# Patient Record
Sex: Male | Born: 1950 | Race: Black or African American | Hispanic: No | Marital: Married | Smoking: Never smoker
Health system: Southern US, Community
[De-identification: ages and names within clinical notes are randomized; demographics above are authoritative.]

## PROBLEM LIST (undated history)

## (undated) DIAGNOSIS — E785 Hyperlipidemia, unspecified: Secondary | ICD-10-CM

## (undated) DIAGNOSIS — G709 Myoneural disorder, unspecified: Secondary | ICD-10-CM

## (undated) DIAGNOSIS — R112 Nausea with vomiting, unspecified: Secondary | ICD-10-CM

## (undated) DIAGNOSIS — R569 Unspecified convulsions: Secondary | ICD-10-CM

## (undated) DIAGNOSIS — M1990 Unspecified osteoarthritis, unspecified site: Secondary | ICD-10-CM

## (undated) DIAGNOSIS — J189 Pneumonia, unspecified organism: Secondary | ICD-10-CM

## (undated) DIAGNOSIS — D649 Anemia, unspecified: Secondary | ICD-10-CM

## (undated) DIAGNOSIS — I8290 Acute embolism and thrombosis of unspecified vein: Secondary | ICD-10-CM

## (undated) DIAGNOSIS — Z8639 Personal history of other endocrine, nutritional and metabolic disease: Secondary | ICD-10-CM

## (undated) DIAGNOSIS — Z9889 Other specified postprocedural states: Secondary | ICD-10-CM

## (undated) DIAGNOSIS — R51 Headache: Secondary | ICD-10-CM

## (undated) DIAGNOSIS — E119 Type 2 diabetes mellitus without complications: Secondary | ICD-10-CM

## (undated) DIAGNOSIS — I10 Essential (primary) hypertension: Secondary | ICD-10-CM

## (undated) DIAGNOSIS — N19 Unspecified kidney failure: Secondary | ICD-10-CM

## (undated) DIAGNOSIS — I509 Heart failure, unspecified: Secondary | ICD-10-CM

## (undated) DIAGNOSIS — K635 Polyp of colon: Secondary | ICD-10-CM

## (undated) DIAGNOSIS — N186 End stage renal disease: Secondary | ICD-10-CM

## (undated) HISTORY — DX: Anemia, unspecified: D64.9

## (undated) HISTORY — DX: Headache: R51

## (undated) HISTORY — DX: Personal history of other endocrine, nutritional and metabolic disease: Z86.39

## (undated) HISTORY — DX: Essential (primary) hypertension: I10

## (undated) HISTORY — DX: Acute embolism and thrombosis of unspecified vein: I82.90

## (undated) HISTORY — DX: Unspecified kidney failure: N19

## (undated) HISTORY — DX: Hyperlipidemia, unspecified: E78.5

## (undated) HISTORY — DX: Polyp of colon: K63.5

---

## 1998-01-16 ENCOUNTER — Other Ambulatory Visit: Admission: RE | Admit: 1998-01-16 | Discharge: 1998-01-16 | Payer: Self-pay | Admitting: Nephrology

## 1998-01-22 ENCOUNTER — Ambulatory Visit (HOSPITAL_COMMUNITY): Admission: RE | Admit: 1998-01-22 | Discharge: 1998-01-22 | Payer: Self-pay | Admitting: Nephrology

## 1998-02-05 ENCOUNTER — Other Ambulatory Visit: Admission: RE | Admit: 1998-02-05 | Discharge: 1998-02-05 | Payer: Self-pay | Admitting: Nephrology

## 2003-06-20 ENCOUNTER — Encounter: Admission: RE | Admit: 2003-06-20 | Discharge: 2003-09-18 | Payer: Self-pay | Admitting: General Practice

## 2010-08-11 DIAGNOSIS — K635 Polyp of colon: Secondary | ICD-10-CM

## 2010-08-11 HISTORY — DX: Polyp of colon: K63.5

## 2010-09-08 ENCOUNTER — Inpatient Hospital Stay (HOSPITAL_COMMUNITY)
Admission: EM | Admit: 2010-09-08 | Discharge: 2010-09-13 | DRG: 315 | Disposition: A | Discharge status: Home / Self Care | Payer: BC Managed Care – PPO | Attending: Internal Medicine | Admitting: Internal Medicine

## 2010-09-08 DIAGNOSIS — E785 Hyperlipidemia, unspecified: Secondary | ICD-10-CM | POA: Diagnosis present

## 2010-09-08 DIAGNOSIS — E119 Type 2 diabetes mellitus without complications: Secondary | ICD-10-CM | POA: Diagnosis present

## 2010-09-08 DIAGNOSIS — N2581 Secondary hyperparathyroidism of renal origin: Secondary | ICD-10-CM | POA: Diagnosis present

## 2010-09-08 DIAGNOSIS — D631 Anemia in chronic kidney disease: Secondary | ICD-10-CM | POA: Diagnosis present

## 2010-09-08 DIAGNOSIS — Z7982 Long term (current) use of aspirin: Secondary | ICD-10-CM

## 2010-09-08 DIAGNOSIS — E8779 Other fluid overload: Secondary | ICD-10-CM | POA: Diagnosis present

## 2010-09-08 DIAGNOSIS — G589 Mononeuropathy, unspecified: Secondary | ICD-10-CM | POA: Diagnosis present

## 2010-09-08 DIAGNOSIS — N186 End stage renal disease: Principal | ICD-10-CM | POA: Diagnosis present

## 2010-09-08 DIAGNOSIS — I120 Hypertensive chronic kidney disease with stage 5 chronic kidney disease or end stage renal disease: Secondary | ICD-10-CM | POA: Diagnosis present

## 2010-09-08 DIAGNOSIS — N179 Acute kidney failure, unspecified: Secondary | ICD-10-CM | POA: Diagnosis present

## 2010-09-08 LAB — TROPONIN I
Troponin I: 0.05 ng/mL (ref 0.00–0.06)
Troponin I: 0.03 ng/mL (ref 0.00–0.06)

## 2010-09-08 LAB — TSH: TSH: 1.775 u[IU]/mL (ref 0.350–4.500)

## 2010-09-08 LAB — HIV ANTIBODY (ROUTINE TESTING W REFLEX): HIV: NONREACTIVE

## 2010-09-08 LAB — CARDIAC PANEL(CRET KIN+CKTOT+MB+TROPI)
CK, MB: 5 ng/mL — ABNORMAL HIGH (ref 0.3–4.0)
CK, MB: 5 ng/mL — ABNORMAL HIGH (ref 0.3–4.0)
Relative Index: 0.7 (ref 0.0–2.5)
Total CK: 673 U/L — ABNORMAL HIGH (ref 7–232)
Total CK: 789 U/L — ABNORMAL HIGH (ref 7–232)
Troponin I: 0.03 ng/mL (ref 0.00–0.06)

## 2010-09-08 LAB — FERRITIN: Ferritin: 557 ng/mL — ABNORMAL HIGH (ref 22–322)

## 2010-09-08 LAB — BASIC METABOLIC PANEL
CO2: 22 mEq/L (ref 19–32)
GFR calc Af Amer: 6 mL/min — ABNORMAL LOW (ref >60)
Glucose, Bld: 138 mg/dL — ABNORMAL HIGH (ref 70–99)
Potassium: 3.7 mEq/L (ref 3.5–5.1)
Sodium: 139 mEq/L (ref 135–145)

## 2010-09-08 LAB — IRON AND TIBC
Saturation Ratios: 17 % — ABNORMAL LOW (ref 20–55)
Saturation Ratios: 7 % — ABNORMAL LOW (ref 20–55)
TIBC: 261 ug/dL (ref 215–435)
TIBC: 220 ug/dL (ref 215–435)
UIBC: 216 ug/dL
UIBC: 204 ug/dL

## 2010-09-08 LAB — LIPID PANEL
Cholesterol: 214 mg/dL — ABNORMAL HIGH (ref 0–200)
LDL Cholesterol: 137 mg/dL — ABNORMAL HIGH (ref 0–99)
Triglycerides: 56 mg/dL (ref <150)
VLDL: 11 mg/dL (ref 0–40)

## 2010-09-08 LAB — VITAMIN B12: Vitamin B-12: 1372 pg/mL — ABNORMAL HIGH (ref 211–911)

## 2010-09-08 LAB — CK TOTAL AND CKMB (NOT AT ARMC)
CK, MB: 4.5 ng/mL — ABNORMAL HIGH (ref 0.3–4.0)
Relative Index: 1.4 (ref 0.0–2.5)
Relative Index: 0.9 (ref 0.0–2.5)

## 2010-09-08 LAB — DIFFERENTIAL
Basophils Absolute: 0 10*3/uL (ref 0.0–0.1)
Basophils Relative: 0 % (ref 0–1)
Lymphocytes Relative: 13 % (ref 12–46)
Neutro Abs: 11.3 10*3/uL — ABNORMAL HIGH (ref 1.7–7.7)
Neutrophils Relative %: 81 % — ABNORMAL HIGH (ref 43–77)

## 2010-09-08 LAB — C3 COMPLEMENT: C3 Complement: 85 mg/dL — ABNORMAL LOW (ref 88–201)

## 2010-09-08 LAB — CBC
HCT: 31.1 % — ABNORMAL LOW (ref 39.0–52.0)
Hemoglobin: 9.8 g/dL — ABNORMAL LOW (ref 13.0–17.0)
RBC: 3.94 MIL/uL — ABNORMAL LOW (ref 4.22–5.81)
WBC: 13.9 10*3/uL — ABNORMAL HIGH (ref 4.0–10.5)

## 2010-09-08 LAB — URINALYSIS, ROUTINE W REFLEX MICROSCOPIC
Ketones, ur: NEGATIVE mg/dL
Leukocytes, UA: NEGATIVE
Protein, ur: 100 mg/dL — AB
Urine Glucose, Fasting: NEGATIVE mg/dL
Urobilinogen, UA: 0.2 mg/dL (ref 0.0–1.0)

## 2010-09-08 LAB — GLUCOSE, CAPILLARY: Glucose-Capillary: 99 mg/dL (ref 70–99)

## 2010-09-08 LAB — NA AND K (SODIUM & POTASSIUM), RAND UR: Potassium Urine: 29 mEq/L

## 2010-09-08 LAB — C4 COMPLEMENT: Complement C4, Body Fluid: 26 mg/dL (ref 16–47)

## 2010-09-08 LAB — FOLATE: Folate: 8.3 ng/mL

## 2010-09-08 LAB — PROTEIN / CREATININE RATIO, URINE: Creatinine, Urine: 66.1 mg/dL

## 2010-09-08 LAB — URINE MICROSCOPIC-ADD ON

## 2010-09-08 LAB — MAGNESIUM: Magnesium: 2.3 mg/dL (ref 1.5–2.5)

## 2010-09-09 LAB — COMPREHENSIVE METABOLIC PANEL
ALT: 43 U/L (ref 0–53)
Albumin: 3.2 g/dL — ABNORMAL LOW (ref 3.5–5.2)
Alkaline Phosphatase: 87 U/L (ref 39–117)
BUN: 115 mg/dL — ABNORMAL HIGH (ref 6–23)
Chloride: 101 mEq/L (ref 96–112)
Glucose, Bld: 140 mg/dL — ABNORMAL HIGH (ref 70–99)
Potassium: 3.2 mEq/L — ABNORMAL LOW (ref 3.5–5.1)
Sodium: 139 mEq/L (ref 135–145)
Total Bilirubin: 0.6 mg/dL (ref 0.3–1.2)

## 2010-09-09 LAB — HEPATITIS PANEL, ACUTE
Hep A IgM: NEGATIVE
Hep B C IgM: NEGATIVE

## 2010-09-09 LAB — CBC
HCT: 26.5 % — ABNORMAL LOW (ref 39.0–52.0)
MCV: 79.1 fL (ref 78.0–100.0)
RBC: 3.35 MIL/uL — ABNORMAL LOW (ref 4.22–5.81)
WBC: 9.8 10*3/uL (ref 4.0–10.5)

## 2010-09-09 LAB — GLUCOSE, CAPILLARY
Glucose-Capillary: 137 mg/dL — ABNORMAL HIGH (ref 70–99)
Glucose-Capillary: 132 mg/dL — ABNORMAL HIGH (ref 70–99)
Glucose-Capillary: 100 mg/dL — ABNORMAL HIGH (ref 70–99)

## 2010-09-09 LAB — ANA: Anti Nuclear Antibody(ANA): NEGATIVE

## 2010-09-09 LAB — PTH, INTACT AND CALCIUM
Calcium, Total (PTH): 7 mg/dL — ABNORMAL LOW (ref 8.4–10.5)
PTH: 1076.5 pg/mL — ABNORMAL HIGH (ref 14.0–72.0)

## 2010-09-09 LAB — BRAIN NATRIURETIC PEPTIDE: Pro B Natriuretic peptide (BNP): 482 pg/mL — ABNORMAL HIGH (ref 0.0–100.0)

## 2010-09-09 LAB — VITAMIN D 25 HYDROXY (VIT D DEFICIENCY, FRACTURES): Vit D, 25-Hydroxy: 12 ng/mL — ABNORMAL LOW (ref 30–89)

## 2010-09-09 NOTE — Consult Note (Signed)
[name], [name]             ACCOUNT NO.:  [id number]  MEDICAL RECORD NO.:  [id number]          PATIENT TYPE:  INP  LOCATION:  [id number]                         FACILITY:  [facility]  PHYSICIAN:  Sol Blazing, M.D.DATE OF BIRTH:  [birth year]  DATE OF CONSULTATION:  09/08/2010 DATE OF DISCHARGE:                                CONSULTATION   REQUESTING PHYSICIAN:  Sheila Oats, MD  REASON FOR CONSULT:  Renal failure and uncontrolled hypertension.  HISTORY:  The patient is a 60 year old [place] male with a history of diabetes and high blood pressure who presented with cough and shortness of breath of 24 hours duration.  The patient presented to the emergency room with these complaints and was found to have pulmonary edema, severe hypertension with blood pressure 208/129 and a creatinine of 10.8.  He denies any history of past kidney disease.  The patient was given Lasix and oxygen, has had good urine output and he states his shortness of breath is improved.  He was admitted to the tep-down unit.  Blood pressure on IV nitroglycerin is now 179/90.  The patient says he is not taking any medications currently.  He took some NSAIDs in the last year between May and September, Naprosyn, about 2-3 times day.  He was diagnosed with both high blood pressure and diabetes in 2004 or 2005.  It ounds like he took diabetic medication for some time and then he has been "diet-controlled" and not on medication for years.  The patient is vague in the details.  He says he has been prescribed blood pressure medication in the past, but had side effects and said it made him "feel bad,"  so he stopped taking it.  The last time he saw a doctor was what sounds like an urgent-care physician that he has been seeing regularly over the years about once or twice a year.  The last time  he saw him was in July 2011.  He has not taking any blood pressure medication since around that time.  He says he goes to  the doctor "when he feels bad".  He has not had a primary care physician in the commonly used sense of the term.  He said the last medicine he was prescribed was a combination blood pressure and cholesterol pill.  We have no old lab values.  This is the first time that he has been to this facility and admitted here.  PAST MEDICAL HISTORY: 1. High blood pressure 7 years' duration. 2. Diabetes 7 years' duration. 3. Questionable history of gout.  He denies any history of kidney disease.  PAST SURGICAL HISTORY:  None.  MEDICATIONS:  None.  ALLERGIES:  None.  SOCIAL HISTORY:  Drinks 1-2 cans of beer a week.  No history of any heavy alcohol, drug, or tobacco use.  He is married.  His wife is here with him today.  He is currently between jobs he says.  FAMILY HISTORY:  Father had diabetes and hypertension.  REVIEW OF SYSTEMS:  Denies any recent fever, chills, sweats, headache, visual change, sore throat, or difficulty swallowing.  He did present with shortness of breath,  orthopnea, and PND over 24-48 hours.  He had a cough which was nonproductive.  Denies any active chest pain.  He has had several episodes of vomiting and nausea over the last 6 months, but they have not been on a daily basis, maybe less than once a month according to his history.  He does endorse anorexia and loss of taste for food.  This has been going on for some time.  He denies any loss of energy.  He has been having cramps in the feet and calves, and this is one of his primary complaints.  His sleeping has been okay.  Denies any diarrhea.  No confusion or memory loss.  MUSCULOSKELETAL:  He thinks he has a history of gout but he has never been had a joint tapped and he does not take medication for the same.  GU:  Denies any difficulty voiding, dysuria, history of prostate disease, or kidney failure.  GI: Denies any diarrhea, otherwise as above.  No abdominal pain.  No history of any peptic ulcer disease,  pancreatitis, or liver disease. NEUROLOGIC:  Denies any history of stroke, TIA, seizure, focal numbness, or weakness.  CARDIAC:  Denies any history of heart attack, angina, or heart failure.  PSYCHIATRIC:  Denies any history of anxiety, depression, schizophrenia, or hallucination.  PHYSICAL EXAMINATION:  VITAL SIGNS:  Blood pressure 170/97 on nitroglycerin drip, heart rate 80, respirations 18, temperature 98.6. SKIN:  Warm and dry without rash or cyanosis. HEENT:  PERRLA, EOMI.  Throat is clear and moist. NECK:  Supple.  Neck veins are distended.  JVP of 12-14 cm. CHEST:  Crackles in the left base.  No wheezing and decreased breath sounds in the left base, otherwise clear. CARDIAC:  Regular rate and rhythm.  No murmur, rub, or gallop. Precordium is quiet.  No heaves or lifts. ABDOMEN:  Soft, nontender, active bowel sounds.  No ascites.  No masses, no organomegaly.  Liver is palpable 2 cm below the right costal margin. GU:  Normal male circumcised genitalia.  No catheter in place. EXTREMITIES:  Pitting edema 1+ in the lower extremities below the knees bilaterally, not severe, but definitely present.  Good pulses in the feet, 2+ bilaterally.  No calf cords or tenderness.  No asymmetric edema. NEUROLOGIC:  Nonfocal motor exam.  Alert and oriented x3.  Responds appropriately, and he is quite awake and alert.  No asterixis.  Gait was not tested.  LABORATORY DATA:  Sodium 139, potassium 3.7, CO2 of 22, BUN 119, creatinine 10.8, glucose 138.  White blood count 13,000, hemoglobin 9.8, platelets 201.  Chest x-ray, vascular congestion with interstitial edema.  Urinalysis, 100 protein, 3-6 red blood cells, 0-2 white blood cells.  Ultrasound, 9.4 cm on one side, 10.8 on the other with markedly increased echogenicity.  No hydronephrosis.  Phosphorus 6.2.  GFR 6 mL per minute.  IMPRESSION: 1. Renal failure, severe, unclear if there me be any acute reversible     component related to malignant  hypertension.     However, with the patient's history of inconsistent followup     and inconsistent medication compliance combined with the     presentation positive for anorexia, intermittent nausea, vomiting,     severe hypertension, anemia, hyperphosphatemia, and echogenic     kidneys which are measuring less than 10 cm, this is all     concerning for suspected chronic kidney failure.  If so this is     stage V disease and quite possibly irreversible.  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  Also could have some diabetic     nephropathy.  For now, we will follow for the next 48-72 hours     clinically.  Give Lasix a chance to work for the pulmonary edema     and volume overload, and see if the creatinine improves any over     the next few days with blood pressure control.  If he develops any     severe uremic symptoms,  he will require acute initiation     of dialysis.  Also, if the renal function doesn't improve, he will     likely require     initiation of dialysis during this hospitalization. All of this was      explained to the patient and the patient's wife who are a little bit      shocked at the gravity of the situation. 2. Diabetes, diet controlled according to the patient.  We will check     hemoglobin A1c.  He has 7-year history.3. Severe hypertension, on IV nitroglycerin.  Also 7-year history. 4. Volume excess with pulmonary edema, mild to moderate severity. 5. Hyperphosphatemia.  Suspect secondary hyperparathyroidism.    RECOMMENDATIONS: Give IV Lasix 2 more doses, check iron stores, PTH, and     vitamin D level and urine protein to creatinine ratio.  Remove IV     from the left arm and save the left arm and do vein mapping.  Start     Norvasc and labetalol p.o. for blood pressure control, and wean     off the IV nitroglycerin.  Follow daily creatinine for next     couple of days.  Make a decision on dialysis pending subsequent blood chemistries     symptoms,  etc.     Sol Blazing, M.D.     RDS/MEDQ  D:  09/08/2010  T:  09/09/2010  Job:  [id number]  Electronically Signed by Roney Jaffe M.D. on 09/09/2010 08:40:29 AM

## 2010-09-10 LAB — RENAL FUNCTION PANEL
Albumin: 3 g/dL — ABNORMAL LOW (ref 3.5–5.2)
BUN: 106 mg/dL — ABNORMAL HIGH (ref 6–23)
Calcium: 7.1 mg/dL — ABNORMAL LOW (ref 8.4–10.5)
Creatinine, Ser: 10.94 mg/dL — ABNORMAL HIGH (ref 0.4–1.5)
Glucose, Bld: 132 mg/dL — ABNORMAL HIGH (ref 70–99)
Phosphorus: 6 mg/dL — ABNORMAL HIGH (ref 2.3–4.6)
Potassium: 3.4 mEq/L — ABNORMAL LOW (ref 3.5–5.1)

## 2010-09-10 LAB — PROTEIN ELECTROPHORESIS, SERUM
Albumin ELP: 61.9 % (ref 55.8–66.1)
Alpha-1-Globulin: 5.3 % — ABNORMAL HIGH (ref 2.9–4.9)
Beta 2: 5.1 % (ref 3.2–6.5)

## 2010-09-10 LAB — CBC
MCV: 78.9 fL (ref 78.0–100.0)
Platelets: 154 10*3/uL (ref 150–400)
RBC: 3.22 MIL/uL — ABNORMAL LOW (ref 4.22–5.81)
RDW: 14 % (ref 11.5–15.5)
WBC: 8.7 10*3/uL (ref 4.0–10.5)

## 2010-09-10 LAB — GLUCOSE, CAPILLARY
Glucose-Capillary: 102 mg/dL — ABNORMAL HIGH (ref 70–99)
Glucose-Capillary: 130 mg/dL — ABNORMAL HIGH (ref 70–99)

## 2010-09-10 LAB — MAGNESIUM: Magnesium: 1.9 mg/dL (ref 1.5–2.5)

## 2010-09-11 DIAGNOSIS — N186 End stage renal disease: Secondary | ICD-10-CM

## 2010-09-11 DIAGNOSIS — I120 Hypertensive chronic kidney disease with stage 5 chronic kidney disease or end stage renal disease: Secondary | ICD-10-CM

## 2010-09-11 DIAGNOSIS — I739 Peripheral vascular disease, unspecified: Secondary | ICD-10-CM

## 2010-09-11 LAB — GLUCOSE, CAPILLARY
Glucose-Capillary: 123 mg/dL — ABNORMAL HIGH (ref 70–99)
Glucose-Capillary: 102 mg/dL — ABNORMAL HIGH (ref 70–99)

## 2010-09-12 DIAGNOSIS — N186 End stage renal disease: Secondary | ICD-10-CM

## 2010-09-12 DIAGNOSIS — I120 Hypertensive chronic kidney disease with stage 5 chronic kidney disease or end stage renal disease: Secondary | ICD-10-CM

## 2010-09-12 LAB — RENAL FUNCTION PANEL
CO2: 22 mEq/L (ref 19–32)
Calcium: 7.5 mg/dL — ABNORMAL LOW (ref 8.4–10.5)
Creatinine, Ser: 11.43 mg/dL — ABNORMAL HIGH (ref 0.4–1.5)
Glucose, Bld: 88 mg/dL (ref 70–99)

## 2010-09-12 LAB — CBC
HCT: 24.2 % — ABNORMAL LOW (ref 39.0–52.0)
Hemoglobin: 7.9 g/dL — ABNORMAL LOW (ref 13.0–17.0)
MCH: 25.6 pg — ABNORMAL LOW (ref 26.0–34.0)
MCHC: 32.6 g/dL (ref 30.0–36.0)

## 2010-09-12 LAB — GLUCOSE, CAPILLARY
Glucose-Capillary: 99 mg/dL (ref 70–99)
Glucose-Capillary: 99 mg/dL (ref 70–99)

## 2010-09-12 LAB — SURGICAL PCR SCREEN: Staphylococcus aureus: NEGATIVE

## 2010-09-13 LAB — GLUCOSE, CAPILLARY: Glucose-Capillary: 123 mg/dL — ABNORMAL HIGH (ref 70–99)

## 2010-09-14 NOTE — Discharge Summary (Signed)
NAMEMARSON, [name]             ACCOUNT NO.:  [id number]  MEDICAL RECORD NO.:  [id number]           PATIENT TYPE:  I  LOCATION:  [id number]                         FACILITY:  [facility]  PHYSICIAN:  Kieth Brightly, MDDATE OF BIRTH:  January 18, 1951  DATE OF ADMISSION:  09/08/2010 DATE OF DISCHARGE:                        DISCHARGE SUMMARY - REFERRING   PRIMARY CARE PHYSICIAN:  Currently, he does not have and he is expected to see some physician in [facility].  NEPHROLOGY:  Elmarie Shiley, MD.  VASCULAR SURGERY:  [name]. Scot Dock, MD.  DISCHARGE DIAGNOSES: 1. Acute pulmonary edema with shortness of breath with hypoxemia - due     to increase volume overload secondary to end-stage renal disease. 2. End-stage renal disease - new diagnosis secondary to uncontrolled     hypertension. 3. Malignant essential hypertension, currently controlled with     medications. 4. Anemia of kidney disease. 5. 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 6. Status post placement of the AV fistula in left upper extremity. 7. End-stage renal disease, not on hemodialysis yet - status post AV     fistula placement. 8. Previously mentioned as history of diabetes mellitus - blood sugars     during hospital stay were totally normal and hemoglobin A1c is well     within normal limits - really questionable about the previous     mention about history of diabetes - needs outpatient followup with     fasting blood sugar levels. 9. Dyslipidemia.  DISCHARGE MEDICATIONS: 1. Enteric-coated aspirin 81 mg p.o. daily. 2. Lipitor 10 mg p.o. daily q.h.s. 3. Calcitriol 0.5 mcg p.o. daily. 4. Calcium acetate 667 mg tablets two tablets p.o. t.i.d. with meals. 5. Coreg 12.5 mg p.o. b.i.d. with meals. 6. Gabapentin 100 mg p.o. t.i.d. 7. Hydralazine 25 mg p.o. t.i.d. 8. Nu-Iron 150 mg p.o. daily with breakfast. 9. Procardia XL 60 mg p.o. q.a.m. 10.Procrit 10,000 units subcutaneously weekly. 11.The  following medication has been discontinued; Aleve, naproxen,     and prednisone.  HOSPITAL COURSE: 1. End-stage renal disease with fluid overload - the patient presented     to the emergency room on 09/08/2010 with extreme shortness of     breath.  He stated that he could not breath and he felt like he was     under water and can get himself breath.  His creatinine was found     to be markedly elevated.  We do not have any prior lab values to     compare with.  To note, the patient stated that he has history of     hypertension not being taking any medications and he also mentioned     that he has been diagnosed with diabetes once before; not sure     about the previous treatment, but he was not on any medications.     His creatinine was found to be extremely high with high BUN levels     and renal consult was called.  Sonogram of the renal kidneys     revealed chronic kidney disease and the patient had elevated PTH     levels and high phosphorus levels  going in with a diagnosis of     chronic kidney disease rather than acute renal failure.  He also     had normocytic anemia due to chronic kidney disease.  The patient,     however, is making pretty good urine and he will be following up     with Dr. Elmarie Shiley.  Nephrology has signed off and they said there     is no need for dialysis at this time.  However, he has had an AV     fistula placed in his left upper extremity in anticipation for     future hemodialysis.  Currently, he will be followed closely with     electrolyte levels.  His BUN is 97, but his mental status is pretty     okay possibly due to chronic adaptation to the urea level.  He will     follow up with Nephrology as an outpatient for further workup if     needed as well as with further routine followups.  Currently, his fluid status has completely reversed.  He is not short of breath anymore and he is ready for discharge as per Nephrology. 1. Hypertension, pulmonary  edema.  Pulmonary edema is due to fluid     overload and high blood pressures.  Blood pressure has been well     controlled.  At this time, the pulmonary edema is easy, does not     have any crackles in his chest.  He possibly has stiff vessels; so     I will add as much vasodilators at this time, so I will replace     Norvasc with Procardia XL and will continue on Coreg instead of     labetalol as well as we will continue with hydralazine.  He will     however take 81 mg baby aspirin daily. 2. For dyslipidemia, we will put him on Lipitor and follow up with his     PMD for further blood checks and also can follow up with his     Nephrologist for a check of liver function tests in another two to     three weeks.  DISPOSITION:  Discharged back home.  FOLLOWUP: 1. Follow up with Dr. Elmarie Shiley as outpatient in one week's time.  The     appointment for Dr. Elmarie Shiley is on 09/25/2010. 2. Follow up with the primary care physician at [facility],     which has been set up at this time. 3. Follow up with Dr. Deitra Mayo in four to six weeks.     Office will arrange for followup.  SUBJECTIVE:  VITAL SIGNS:  Temperature 98.9 heart rate 79, respiration 20, blood pressure 148/82, O2 sat 97% next GENERAL:  The patient seen and examined today, not in obvious distress, afebrile. HEAD AND NECK:  No JVD, no bruit, no nodes. CHEST:  Bilateral air entry good anteriorly and posteriorly.  No rales or wheezes. CARDIAC:  S1, S2 regular.  No murmurs. ABDOMEN:  Soft, nontender.  No organomegaly. EXTREMITIES:  Left upper extremity shows AV fistula, which is working with a good thrill.  ASSESSMENT AND PLAN:  As dictated above.  A total of 45 minutes spent on the discharge process.     Kieth Brightly, MD     UT/MEDQ  D:  09/13/2010  T:  09/13/2010  Job:  [id number]  cc:   Elmarie Shiley, MD [name]. Scot Dock, M.D.  Electronically Signed by Kieth Brightly  MD on 09/14/2010  11:27:18 AM

## 2010-09-16 NOTE — Consult Note (Addendum)
NAMEHENRICK, [name]             ACCOUNT NO.:  [id number]  MEDICAL RECORD NO.:  [id number]           PATIENT TYPE:  I  LOCATION:  [id number]                         FACILITY:  [facility]  PHYSICIAN:  Wray Kearns, PA-C  DATE OF BIRTH:  1951-02-25  DATE OF CONSULTATION:  09/11/2010 DATE OF DISCHARGE:                                CONSULTATION   CHIEF COMPLAINT:  End-stage renal disease.  The patient not on hemodialysis, but need for hemodialysis access.  HISTORY OF PRESENT ILLNESS:  Mr. Janke is a 60 year old male with a history of diabetes, hypertension, hypercholesterolemia, and gout who was admitted on September 08, 2010, with shortness of breath and nausea and vomiting.  He was found at that time to have creatinine greater than 10 and a blood pressure of 205/129.  He was admitted to Medicine Service for blood pressure control.  He was seen by Renal Service and they asked [place] to place an arterial venous fistula versus graft as the patient does not need hemodialysis immediately, but will need it in the near future. Vein mapping was done as well.  Labs on September 10, 2010; sodium was 140, potassium was 3.4, BUN was 106, and creatinine was 10.94.  His hemoglobin/hematocrit was 8.3 and 25.4 with a white count of 8.7.  IMAGING:  Vein mapping shows cephalic vein left upper extremity to be greater than 3 mm from the wrist to the axilla.  The patient is right- hand dominant.  PAST MEDICAL HISTORY:  Significant for, 1. Diabetes. 2. Hypertension. 3. Hypercholesterolemia. 4. Gout. 5. End-stage renal disease.  ALLERGIES:  He denies any allergies.  MEDICATIONS:  He is on aspirin, sliding scale insulin, labetalol, PhosLo, and amlodipine.  FAMILY HISTORY:  He states his father had a history of diabetes, but does not know the rest of the family history as he is from [place].  REVIEW OF SYSTEMS:  Ten-point review of systems is as above positive for gout, shortness of breath end-stage  renal disease.  He denies coronary artery disease or claudication-type symptoms.  He does state he has occasionally sharp pains in the bottom of the feet, which prevents him from walking long distances.  He denies any diabetic neuropathy or numbness or tingling in either foot.  PHYSICAL EXAMINATION:  GENERAL:  This is a well-developed, well- nourished gentleman in no acute distress.  He is alert and oriented x3. VITAL SIGNS:  His saturations are 99%, blood pressure is 140/77, heart rate is 76 in sinus rhythm. LUNGS:  Clear anteriorly. HEART:  Rate and rhythm is regular. EXTREMITIES:  Left upper extremity has a 2+ radial pulse and 1+ ulnar pulse, both palpable.  His hand is warm and pink with good sensation and motion.  Bilateral lower extremities, he has palpable DP and PT pulses with normal sensation and motion.  Of note, his fourth toes are small than the rest of the toes on both feet.  ASSESSMENT/PLAN:  End-stage renal disease in this hypertensive diabetic patient who does not need hemodialysis, but will in the near future.  He has adequate cephalic vein in the left upper extremity and we should be able to do  an arteriovenous fistula.  The plan is to do left upper extremity AV fistula versus graft on September 12, 2010.  The patient shows understanding.  All questions were answered, reports were written for consent and lab work and antibiotics prior to surgery.     Wray Kearns, PA-C     RR/MEDQ  D:  09/11/2010  T:  09/12/2010  Job:  [id number]  Electronically Signed by Wray Kearns PA on 09/16/2010 10:18:34 AM

## 2010-09-17 ENCOUNTER — Encounter (HOSPITAL_COMMUNITY): Payer: BC Managed Care – PPO | Attending: Internal Medicine

## 2010-09-17 DIAGNOSIS — N039 Chronic nephritic syndrome with unspecified morphologic changes: Secondary | ICD-10-CM | POA: Insufficient documentation

## 2010-09-17 DIAGNOSIS — D631 Anemia in chronic kidney disease: Secondary | ICD-10-CM | POA: Insufficient documentation

## 2010-09-17 DIAGNOSIS — N186 End stage renal disease: Secondary | ICD-10-CM | POA: Insufficient documentation

## 2010-09-18 NOTE — Op Note (Signed)
  NAMEJASUN, OWCZARZAK NO.:  [id number]  MEDICAL RECORD NO.:  [id number]           PATIENT TYPE:  LOCATION:                                 FACILITY:  PHYSICIAN:  [name]. Scot Dock, M.D.DATE OF BIRTH:  24-Jun-1951  DATE OF PROCEDURE:  09/12/2010 DATE OF DISCHARGE:                              OPERATIVE REPORT   PREOPERATIVE DIAGNOSIS:  Chronic kidney disease.  POSTOPERATIVE DIAGNOSIS:  Chronic kidney disease.  PROCEDURE:  Left forearm AV fistula (brachiocephalic fistula).  SURGEON:  [name]. Scot Dock, MD  ASSISTANT:  Nurse.  ANESTHESIA:  Local with sedation.  TECHNIQUE:  The patient was taken to the operating room sedated by Anesthesia.  The left upper extremity was prepped and draped in the usual sterile fashion.  After the skin was infiltrated with 1% lidocaine, an oblique incision was made at the left wrist and here, the cephalic vein was dissected free.  It was ligated distally and then irrigated up with heparinized saline, was about 3.5-mm vein.  The radial artery was dissected free beneath the fascia.  The patient was heparinized.  The radial artery was clamped proximally and distally and a longitudinal arteriotomy was made.  The vein was spatulated, cut to the appropriate length, and sewn end-to-side to the radial artery using continuous 6-0 Prolene suture.  At the completion, there was a good thrill in the fistula.  Hemostasis was obtained in the wound.  The wound was closed with deep layer of 3-0 Vicryl, the skin closed with 4-0 Vicryl.  Sterile dressing was applied.  The patient tolerated the procedure well, was transferred to the recovery room in stable condition.  All needle and sponge counts were correct.     [name]. Scot Dock, M.D.     CSD/MEDQ  D:  09/12/2010  T:  09/13/2010  Job:  [id number]  Electronically Signed by Deitra Mayo M.D. on 09/18/2010 02:09:59 PM

## 2010-09-25 ENCOUNTER — Encounter (HOSPITAL_COMMUNITY): Payer: BC Managed Care – PPO

## 2010-09-30 ENCOUNTER — Ambulatory Visit: Payer: Self-pay | Admitting: Internal Medicine

## 2010-10-02 ENCOUNTER — Other Ambulatory Visit: Payer: Self-pay

## 2010-10-02 ENCOUNTER — Encounter (HOSPITAL_COMMUNITY): Payer: BC Managed Care – PPO

## 2010-10-07 ENCOUNTER — Encounter (HOSPITAL_COMMUNITY): Payer: BC Managed Care – PPO

## 2010-10-09 ENCOUNTER — Encounter (HOSPITAL_COMMUNITY): Payer: BC Managed Care – PPO

## 2010-10-09 ENCOUNTER — Other Ambulatory Visit: Payer: Self-pay

## 2010-10-15 ENCOUNTER — Other Ambulatory Visit: Payer: Self-pay

## 2010-10-15 ENCOUNTER — Encounter (HOSPITAL_COMMUNITY): Payer: BC Managed Care – PPO | Attending: Internal Medicine

## 2010-10-15 DIAGNOSIS — N186 End stage renal disease: Secondary | ICD-10-CM | POA: Insufficient documentation

## 2010-10-15 DIAGNOSIS — D631 Anemia in chronic kidney disease: Secondary | ICD-10-CM | POA: Insufficient documentation

## 2010-10-15 DIAGNOSIS — N039 Chronic nephritic syndrome with unspecified morphologic changes: Secondary | ICD-10-CM | POA: Insufficient documentation

## 2010-10-16 ENCOUNTER — Ambulatory Visit (INDEPENDENT_AMBULATORY_CARE_PROVIDER_SITE_OTHER): Payer: BC Managed Care – PPO | Admitting: Internal Medicine

## 2010-10-16 ENCOUNTER — Ambulatory Visit: Payer: Self-pay | Admitting: Vascular Surgery

## 2010-10-16 ENCOUNTER — Ambulatory Visit (INDEPENDENT_AMBULATORY_CARE_PROVIDER_SITE_OTHER): Payer: BC Managed Care – PPO | Admitting: Vascular Surgery

## 2010-10-16 ENCOUNTER — Encounter: Payer: Self-pay | Admitting: Internal Medicine

## 2010-10-16 DIAGNOSIS — I10 Essential (primary) hypertension: Secondary | ICD-10-CM

## 2010-10-16 DIAGNOSIS — N19 Unspecified kidney failure: Secondary | ICD-10-CM | POA: Insufficient documentation

## 2010-10-16 DIAGNOSIS — R519 Headache, unspecified: Secondary | ICD-10-CM | POA: Insufficient documentation

## 2010-10-16 DIAGNOSIS — N186 End stage renal disease: Secondary | ICD-10-CM

## 2010-10-16 DIAGNOSIS — N179 Acute kidney failure, unspecified: Secondary | ICD-10-CM | POA: Insufficient documentation

## 2010-10-16 DIAGNOSIS — R51 Headache: Secondary | ICD-10-CM | POA: Insufficient documentation

## 2010-10-16 DIAGNOSIS — M7989 Other specified soft tissue disorders: Secondary | ICD-10-CM

## 2010-10-16 DIAGNOSIS — E785 Hyperlipidemia, unspecified: Secondary | ICD-10-CM | POA: Insufficient documentation

## 2010-10-17 LAB — POCT HEMOGLOBIN-HEMACUE: Hemoglobin: 12 g/dL — ABNORMAL LOW (ref 13.0–17.0)

## 2010-10-17 NOTE — Assessment & Plan Note (Signed)
OFFICE VISIT  [name], [name] DOB:  [birth year]                                       10/16/2010 [id number]  I saw the patient for followup after placement of a left forearm AV radiocephalic fistula on September 12, 2010.  He is now approximately 1 month postop and comes in for routine check.  He complains of significant swelling in the left arm.  He has had no significant pain associated with this.  He has not been elevating his arm much.  On examination, blood pressure 140/79, heart rate is 98, temperature is 98.2.  He has significant swelling of the left arm all the way up to the axilla.  The incision is intact and he has a good thrill in his fistula.  He is not yet on dialysis.  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  I think it is a little bit early to cannulate his fistula to do a central venogram to look for a central venous stenosis which we could potentially venoplasty.  I have encouraged him to elevate his arm above his heart and I will plan on seeing him back in 3 weeks.  At that time, we will reassess.  If he is continuing to have significant swelling, we will plan on doing a fistulogram with very limited dye to try to find a central venous stenosis which we could potentially address.  We will also obtain a duplex, when he returns, of his fistula.    [name]. Scot Dock, M.D. Electronically Signed  CSD/MEDQ  D:  10/16/2010  T:  10/17/2010  Job:  [id number]  cc:   [facility]

## 2010-10-17 NOTE — H&P (Addendum)
[name], [name]             ACCOUNT NO.:  [id number]  MEDICAL RECORD NO.:  [id number]          PATIENT TYPE:  EMS  LOCATION:  [facility]JO                         FACILITY:  [facility]  PHYSICIAN:  [name], M.D.DATE OF BIRTH:  Dec 26, 1950  DATE OF ADMISSION:  09/08/2010 DATE OF DISCHARGE:                             HISTORY & PHYSICAL   Unassigned, goes to Urgent Care mostly.  CHIEF COMPLAINT:  Worsening cough with shortness of breath.  HISTORY OF PRESENT ILLNESS:  The patient is a 60 year old black male with past medical history significant for diabetes mellitus, hypertension, hypercholesterolemia and gout, who presents with the above complaints.  He states that he was in his usual state of health until yesterday when he developed a nonproductive cough that worsened throughout the day and in the evening he began having associated shortness of breath as well.  He states that about a month ago he had a similar problem with shortness of breath but that did not last long and resolved spontaneously.  He admits to orthopnea and PND x1 day.  He states that he had mild swelling around his ankle area earlier in the week which he attributed to gout but that resolved after he was treated with a 4-day course of prednisone.  The patient states that he also began vomiting last night - nonbloody and had 3 episodes of emesis, the last was on his way to the ED.  He denies fevers, dysuria, diarrhea, melena, and no hematochezia.  He admits to pleuritic pain (with coughing).  He denies any prior history of kidney disease.  He states that from May through September of last year he took Naprosyn 500 two to three times a day.  He also admits to noncompliance with his medications for diabetes and hypertension and does not recall the names of what he was supposed to be on.  He was seen in the ED and a chest x-ray was done which showed findings suggestive of edema, infection not ruled out but felt  less likely per Radiology.  His brain natriuretic peptide was 702, his white cell count 13.9, and his electrolytes revealed a BUN of 119 with a creatinine of 10.88, no baseline creatinine available.  Upon arrival in the ED, his blood pressure was noted to be 208/129 and he was started on a nitroglycerin drip which got his blood pressure down to 168/108.  He is admitted for further evaluation and management.  PAST MEDICAL HISTORY:  As above.  MEDICATIONS:  He does not know the names of his medications, he only recalls the Naprosyn which as above he took from May through September of last year and that he recently had a prednisone taper for 4 days.  ALLERGIES:  NKDA.  FAMILY HISTORY:  His father had diabetes and hypertension.  He denies any family history of kidney disease, MIs, or strokes.  REVIEW OF SYSTEMS:  As per HPI, other review of systems negative.  PHYSICAL EXAMINATION:  GENERAL:  The patient is a middle-aged black male.  He is alert and oriented x3, in no apparent distress, with nasal cannula oxygen on.VITAL SIGNS:  His blood pressure is 168/108,  initially 208/121, his temperature is 98.4, his pulse is 75, respiratory rate is 21, O2 sat is 99%. HEENT:  PERRL, EOMI, sclerae anicteric.  Moist mucous membranes.  No oral exudates. NECK:  Supple, no adenopathy, no thyromegaly. LUNGS:  He has crackles bilaterally in the lower third of his lung fields.  No wheezes. CARDIOVASCULAR:  Regular rate and rhythm.  Normal S1, S2.  No S3 appreciated. ABDOMEN:  Soft, bowel sounds present, nontender, nondistended.  No organomegaly.  No masses palpable. EXTREMITIES:  No cyanosis, no edema. NEURO:  He is alert and oriented x3.  Cranial nerves II-XII grossly intact.  Nonfocal exam.  LABORATORY DATA:  As per HPI.  Also, his white cell count is 13.9 with a hemoglobin of 9.8, hematocrit of 31.1.  His platelet count is 201 and sodium is 139, potassium 3.7, chloride is 100, CO2 of 22, glucose  138, BUN 119, creatinine 10.88, calcium 6.8.  Urinalysis is negative for infection.  Chest x-ray as per HPI.  Point-of-care markers negative x1 and the brain natriuretic peptide is 702.  ASSESSMENT AND PLAN: 1. Renal failure - acute versus acute on chronic:  Given his anemia,     it is more likely that it is acute on chronic.  His baseline     creatinine is unknown.  His NSAID use is as detailed above, but he     also has longstanding uncontrolled hypertension and diabetes     mellitus and he is noncompliant with medications.  We will obtain a     renal ultrasound to further evaluate.  The patient is also in     pulmonary edema so we will hold off hydration at this time.  I have     consulted Nephrology for possible dialysis/further recommendations.     The patient had a nausea, vomiting as above which is likely     secondary to his elevated BUN/uremia. 2. Pulmonary edema/volume overload - likely secondary to renal     failure:  We will also check cardiac enzymes and a 2-D     echocardiogram.  Renal consult as above for possible     dialysis/further recommendations. 3. Malignant hypertension:  Continue nitroglycerin drip.  Follow and     obtain outpatient medications and resume if appropriate. 4. Diabetes mellitus:  Monitor Accu-Cheks with sliding scale.  Follow,     obtain outpatient medications. 5. Anemia - likely secondary to renal failure:  We will check anemia     panel, stool guaiacs and follow. 6. Hypocalcemia:  Check albumin, also get magnesium and serum     phosphorus.  If albumin is normal we will also check a PTH to     further evaluate given renal failure. 7. History of gout:  Treated with NSAIDs in the past and recent short     steroid course.     [name], M.D.     ACV/MEDQ  D:  09/08/2010  T:  09/08/2010  Job:  [id number]  Electronically Signed by [name] M.D. on 10/15/2010 05:59:49 PM Electronically Signed by [name] M.D. on 10/15/2010  07:32:29 PM

## 2010-10-22 ENCOUNTER — Encounter (HOSPITAL_COMMUNITY): Payer: BC Managed Care – PPO

## 2010-10-22 ENCOUNTER — Other Ambulatory Visit: Payer: Self-pay

## 2010-10-22 NOTE — Assessment & Plan Note (Signed)
Summary: NEW PT / [name]   Vital Signs:  Patient profile:   60 year old male Height:      71 inches Weight:      190 pounds BMI:     26.60 O2 Sat:      98 % on Room air Temp:     98.5 degrees F oral Pulse rate:   80 / minute Pulse rhythm:   regular Resp:     16 per minute BP sitting:   172 / 100  (right arm)  Vitals Entered By: Jonathon Resides, Gabrielle Dare) (October 16, 2010 10:37 AM)  Nutrition Counseling: Patient's BMI is greater than 25 and therefore counseled on weight management options.  O2 Flow:  Room air CC: Est PCP Is Patient Diabetic? No Comments pt is unsure why he is here. He states he was referred here from [facility]. He is seeing Dr. Doren Custard later today to f/u after surgery on his Lt arm.   Primary Care Provider:  Ronnald Ramp  CC:  Est PCP.  History of Present Illness: New to me this gentleman tells me that he needs a new PCP. He tells me that he was admitted to the hospital about 6 weeks ago for what he says was pulmonary edema, a small/shrunken kidney, and renal failure. He is feeling better but he remains concerned about his left arm. He tells me that he had surgery on the arm while in the hospital and that since then the arm has been painful and swollen. He tells me that his kidney doctor put him on lasix for the arm swelling but that has not helped. He sees a vascular surgeon today about the arm. He has been out of BP meds for about 3 days b/c "that is all the doctor gave me." He tells me that he is having labs done at an infusion center where he goes to get procrit injections.  Preventive Screening-Counseling & Management  Alcohol-Tobacco     Alcohol drinks/day: <1     Alcohol type: beer     >5/day in last 3 mos: no     Alcohol Counseling: not indicated; use of alcohol is not excessive or problematic     Feels need to cut down: no     Feels annoyed by complaints: no     Feels guilty re: drinking: no     Needs 'eye opener' in am: no     Smoking Status: never  Tobacco Counseling: not indicated; no tobacco use  Caffeine-Diet-Exercise     Does Patient Exercise: yes  Hep-HIV-STD-Contraception     Hepatitis Risk: no risk noted     HIV Risk: no risk noted     STD Risk: no risk noted      Sexual History:  currently monogamous.        Drug Use:  no.        Blood Transfusions:  no.    Medications Prior to Update: 1)  None  Current Medications (verified): 1)  Hydralazine Hcl 25 Mg Tabs (Hydralazine Hcl) .... One By Mouth Three Times A Day 2)  Carvedilol 12.5 Mg Tabs (Carvedilol) .... One By Mouth Two Times A Day 3)  Procardia Xl 60 Mg Xr24h-Tab (Nifedipine) .... One By Mouth Once Daily 4)  Lipitor 10 Mg Tabs (Atorvastatin Calcium) .... One By Mouth Once Daily  Allergies (verified): No Known Drug Allergies  Past History:  Past Medical History: Headache Hyperlipidemia Hypertension Renal failure  Past Surgical History: Denies surgical history  Family History: Reviewed history and no changes required. Family History Diabetes 1st degree relative Family History Hypertension  Social History: Reviewed history and no changes required. Occupation: PhD Chief Financial Officer Married Never Smoked Alcohol use-yes Drug use-no Regular exercise-yes Smoking Status:  never Hepatitis Risk:  no risk noted HIV Risk:  no risk noted STD Risk:  no risk noted Sexual History:  currently monogamous Blood Transfusions:  no Drug Use:  no Does Patient Exercise:  yes  Review of Systems  The patient denies anorexia, fever, weight loss, weight gain, chest pain, syncope, dyspnea on exertion, peripheral edema, prolonged cough, headaches, hemoptysis, abdominal pain, hematuria, suspicious skin lesions, transient blindness, difficulty walking, depression, and enlarged lymph nodes.   CV:  Denies chest pain or discomfort, difficulty breathing at night, difficulty breathing while lying down, fainting, fatigue, lightheadness, near fainting, palpitations, shortness of  breath with exertion, and swelling of feet. Resp:  Denies chest pain with inspiration, cough, coughing up blood, pleuritic, shortness of breath, sputum productive, and wheezing.  Physical Exam  General:  alert, well-developed, well-nourished, well-hydrated, appropriate dress, normal appearance, healthy-appearing, cooperative to examination, and good hygiene.   Head:  normocephalic, atraumatic, no abnormalities observed, and no abnormalities palpated.   Eyes:  vision grossly intact, pupils equal, pupils round, and pupils reactive to light.   Ears:  R ear normal and L ear normal.   Mouth:  Oral mucosa and oropharynx without lesions or exudates.  Teeth in good repair. Neck:  supple, full ROM, no masses, no thyromegaly, no thyroid nodules or tenderness, no JVD, normal carotid upstroke, no carotid bruits, no cervical lymphadenopathy, and no neck tenderness.   Lungs:  Normal respiratory effort, chest expands symmetrically. Lungs are clear to auscultation, no crackles or wheezes. Heart:  Normal rate and regular rhythm. S1 and S2 normal without gallop, murmur, click, rub or other extra sounds. Abdomen:  soft, non-tender, normal bowel sounds, no distention, no masses, no guarding, no rigidity, no rebound tenderness, no abdominal hernia, no inguinal hernia, no hepatomegaly, and no splenomegaly.   Msk:  left arm is swollen 3x the size of the right arm and it is diffusely warm and erythematous, there is a 2.5 cm healed scar over the lateral/radial side of the wrist, there is a palpable "thrill" felt over the wrist. there is very minimal edema. no cords, masses, phlebitis, induration, wounds, no ulcers. Pulses:  R radial normal and L radial normal.   Extremities:  No clubbing, cyanosis, edema, or deformity noted with normal full range of motion of all joints.   Neurologic:  No cranial nerve deficits noted. Station and gait are normal. Plantar reflexes are down-going bilaterally. DTRs are symmetrical throughout.  Sensory, motor and coordinative functions appear intact. Skin:  turgor normal, color normal, no rashes, no suspicious lesions, no ecchymoses, no petechiae, no purpura, no ulcerations, and no edema.   Cervical Nodes:  no anterior cervical adenopathy and no posterior cervical adenopathy.   Axillary Nodes:  no R axillary adenopathy and no L axillary adenopathy.   Inguinal Nodes:  no R inguinal adenopathy and no L inguinal adenopathy.   Psych:  Cognition and judgment appear intact. Alert and cooperative with normal attention span and concentration. No apparent delusions, illusions, hallucinations   Impression & Recommendations:  Problem # 1:  SWELLING OF LIMB (ICD-729.81) Assessment New he sees Dr. Scot Dock about this today, it looks like he could have a clotted/infected graft, dvt, cellulitis, etc in the left arm  Problem # 2:  RENAL FAILURE (ICD-586) Assessment:  New he will continue to follow with Dr. Posey Pronto about this  Problem # 3:  HYPERTENSION (ICD-401.9) Assessment: New  meds were restarted today His updated medication list for this problem includes:    Hydralazine Hcl 25 Mg Tabs (Hydralazine hcl) ..... One by mouth three times a day    Carvedilol 12.5 Mg Tabs (Carvedilol) ..... One by mouth two times a day    Procardia Xl 60 Mg Xr24h-tab (Nifedipine) ..... One by mouth once daily  BP today: 172/100  Complete Medication List: 1)  Hydralazine Hcl 25 Mg Tabs (Hydralazine hcl) .... One by mouth three times a day 2)  Carvedilol 12.5 Mg Tabs (Carvedilol) .... One by mouth two times a day 3)  Procardia Xl 60 Mg Xr24h-tab (Nifedipine) .... One by mouth once daily 4)  Lipitor 10 Mg Tabs (Atorvastatin calcium) .... One by mouth once daily  Patient Instructions: 1)  Please schedule a follow-up appointment in 2 weeks. 2)  Please see your kidney doctor, Dr. Posey Pronto, as soon as possible. 3)  Please keep your appointment this afternoon with Dr. Scot Dock. 4)  Check your Blood Pressure regularly.  If it is above 140/90: you should make an appointment. Prescriptions: LIPITOR 10 MG TABS (ATORVASTATIN CALCIUM) one by mouth once daily  #30 x 11   Entered and Authorized by:   Janith Lima MD   Signed by:   Janith Lima MD on 10/16/2010   Method used:   Electronically to        [facility] [phone]* (retail)       [address]       [place], [place]  [address]       Ph: [phone]       Fax: [phone]   RxID:   [id number] PROCARDIA XL 60 MG XR24H-TAB (NIFEDIPINE) one by mouth once daily  #30 x 11   Entered and Authorized by:   Janith Lima MD   Signed by:   Janith Lima MD on 10/16/2010   Method used:   Electronically to        [facility] [phone]* (retail)       [address]       [place], [place]  [address]       Ph: [phone]       Fax: [phone]   [id number] CARVEDILOL 12.5 MG TABS (CARVEDILOL) one by mouth two times a day  #60 x 11   Entered and Authorized by:   Janith Lima MD   Signed by:   Janith Lima MD on 10/16/2010   Method used:   Electronically to        [facility] [phone]* (retail)       [place], [place]  [address]       Ph: [phone]       Fax: [phone]   [id number] HYDRALAZINE HCL 25 MG TABS (HYDRALAZINE HCL) one by mouth three times a day  #90 x 11   Entered and Authorized by:   Janith Lima MD   Signed by:   Janith Lima MD on 10/16/2010   Method used:   Electronically to        [facility] [phone]* (retail)       [address]       [place], [place]  [address]       Ph: [phone]  Fax: [phone]   [id number]:[id number]    Orders Added: 1)  New Patient Level IV [id number]

## 2010-10-23 LAB — POCT HEMOGLOBIN-HEMACUE: Hemoglobin: 11.3 g/dL — ABNORMAL LOW (ref 13.0–17.0)

## 2010-10-29 ENCOUNTER — Other Ambulatory Visit: Payer: Self-pay

## 2010-10-29 ENCOUNTER — Encounter (HOSPITAL_COMMUNITY): Payer: BC Managed Care – PPO

## 2010-10-30 ENCOUNTER — Ambulatory Visit (INDEPENDENT_AMBULATORY_CARE_PROVIDER_SITE_OTHER): Payer: BC Managed Care – PPO | Admitting: Internal Medicine

## 2010-10-30 ENCOUNTER — Encounter: Payer: Self-pay | Admitting: Internal Medicine

## 2010-10-30 VITALS — BP 120/68 | HR 68 | Temp 98.2°F | Ht 70.0 in | Wt 190.0 lb

## 2010-10-30 DIAGNOSIS — M7989 Other specified soft tissue disorders: Secondary | ICD-10-CM

## 2010-10-30 DIAGNOSIS — Z23 Encounter for immunization: Secondary | ICD-10-CM

## 2010-10-30 DIAGNOSIS — I10 Essential (primary) hypertension: Secondary | ICD-10-CM

## 2010-10-30 NOTE — Progress Notes (Signed)
  Subjective:    Patient ID: [name], male    DOB: [birth year], 60 y.o.   MRN: [id number]  Hypertension Pertinent negatives include no chest pain, headaches, palpitations or shortness of breath.   He returns for f/up on the swelling in his left arm and he tells me that he has seen his vascular surgeon since my last visit with me and that the VS told him to keep the left arm elevated and that the swelling will go away. The arm is not painful to him and he does not feel like it is warm. He tells me that he does not have an infection or blood clot in his left arm. Also, he sees his kidney doctor later this week but he fells well along those lines.    Review of Systems  Constitutional: Negative for activity change, appetite change and fatigue.  Respiratory: Negative for cough, chest tightness, shortness of breath, wheezing and stridor.   Cardiovascular: Negative for chest pain, palpitations and leg swelling.  Gastrointestinal: Negative for abdominal pain.  Genitourinary: Negative for dysuria, urgency, frequency, hematuria, flank pain, decreased urine volume and difficulty urinating.  Neurological: Negative for dizziness, weakness, light-headedness, numbness and headaches.       Objective:   Physical Exam  Constitutional: He is oriented to person, place, and time. He appears well-developed and well-nourished. No distress.  HENT:  Head: Normocephalic and atraumatic.  Mouth/Throat: No oropharyngeal exudate.  Eyes: EOM are normal. Pupils are equal, round, and reactive to light.  Neck: No thyromegaly present.  Cardiovascular: Regular rhythm, normal heart sounds and intact distal pulses.  Exam reveals no gallop and no friction rub.   No murmur heard. Pulmonary/Chest: No respiratory distress. He has no wheezes. He has no rales. He exhibits no tenderness.  Abdominal: He exhibits no distension. There is no tenderness. There is no rebound.  Musculoskeletal: He exhibits no edema and no  tenderness.       Left arm is diffusely swollen with no erythema, warmth, ttp, wounds, cords, or induration. Left radial pulse is good and strong.  Lymphadenopathy:    He has no cervical adenopathy.  Neurological: He is alert and oriented to person, place, and time. No cranial nerve deficit. Coordination normal.  Skin: Skin is dry. No rash noted. He is not diaphoretic. No erythema. No pallor.  Psychiatric: He has a normal mood and affect. Judgment and thought content normal.          Assessment & Plan:

## 2010-10-30 NOTE — Assessment & Plan Note (Signed)
This is being managed by vascular surgery, no changes are noted today.

## 2010-10-30 NOTE — Assessment & Plan Note (Signed)
His BP is well controlled, will continue current meds and see how his f/up with Nephrology goes later this week.

## 2010-10-30 NOTE — Patient Instructions (Signed)

## 2010-11-06 ENCOUNTER — Encounter (HOSPITAL_COMMUNITY): Payer: BC Managed Care – PPO

## 2010-11-06 ENCOUNTER — Ambulatory Visit (INDEPENDENT_AMBULATORY_CARE_PROVIDER_SITE_OTHER): Payer: BC Managed Care – PPO | Admitting: Vascular Surgery

## 2010-11-06 ENCOUNTER — Encounter (INDEPENDENT_AMBULATORY_CARE_PROVIDER_SITE_OTHER): Payer: BC Managed Care – PPO

## 2010-11-06 ENCOUNTER — Other Ambulatory Visit: Payer: Self-pay | Admitting: Internal Medicine

## 2010-11-06 DIAGNOSIS — T82898A Other specified complication of vascular prosthetic devices, implants and grafts, initial encounter: Secondary | ICD-10-CM

## 2010-11-06 DIAGNOSIS — N186 End stage renal disease: Secondary | ICD-10-CM

## 2010-11-06 LAB — POCT HEMOGLOBIN-HEMACUE: Hemoglobin: 11.9 g/dL — ABNORMAL LOW (ref 13.0–17.0)

## 2010-11-07 NOTE — Assessment & Plan Note (Signed)
OFFICE VISIT  Greg, White DOB:  Dec 17, 1950                                       11/06/2010 [id number]  I saw the patient in the office today for followup of his left forearm AV fistula.  This was done on September 12, 2010.  I had seen him on October 16, 2010, and he had swelling in the left arm.  He is not yet on dialysis.  I suspect that he had a central venous stenosis but I felt it was a little bit early to cannulate his fistula and encouraged him to elevate his arm.  He comes in for a 3-week followup visit.  He has elevated his arm but continues to have swelling in the left arm.  He has had no recent uremic symptoms.  Specifically, he denies nausea, vomiting, fatigue, anorexia, palpitations, or shortness of breath.  He has continued to have swelling in the left arm.  On physical examination, blood pressure is 148/84, temperature is 97.8, heart rate 169.  Lungs are clear bilaterally to auscultation.  He has significant swelling in the left arm.  He has a palpable thrill in his fistula in the left forearm.  He had a duplex scan today which shows that the diameters of the vein in the forearm range from 0.31 cm to 0.37 cm.  Thus, it has not matured well.  He has some increased velocities in the proximal fistula.  He has a couple of small branches noted.  The vein is a larger size of the upper arm.  Given that the swelling has not improved, I have recommended that we proceed with fistulogram with limited dye to evaluate for a central venous stenosis which could potentially be addressed with a venoplasty. This has been scheduled for April 2nd.  We will make further recommendations pending these results.    [name]. Scot Dock, M.D. Electronically Signed  CSD/MEDQ  D:  11/06/2010  T:  11/07/2010  Job:  [id number]  cc:   [facility]

## 2010-11-11 ENCOUNTER — Ambulatory Visit (HOSPITAL_COMMUNITY)
Admission: RE | Admit: 2010-11-11 | Discharge: 2010-11-11 | Disposition: A | Discharge status: Home / Self Care | Payer: BC Managed Care – PPO | Source: Ambulatory Visit | Attending: Vascular Surgery | Admitting: Vascular Surgery

## 2010-11-11 DIAGNOSIS — N186 End stage renal disease: Secondary | ICD-10-CM

## 2010-11-11 DIAGNOSIS — T82898A Other specified complication of vascular prosthetic devices, implants and grafts, initial encounter: Secondary | ICD-10-CM

## 2010-11-11 DIAGNOSIS — Y832 Surgical operation with anastomosis, bypass or graft as the cause of abnormal reaction of the patient, or of later complication, without mention of misadventure at the time of the procedure: Secondary | ICD-10-CM | POA: Insufficient documentation

## 2010-11-11 DIAGNOSIS — I120 Hypertensive chronic kidney disease with stage 5 chronic kidney disease or end stage renal disease: Secondary | ICD-10-CM

## 2010-11-11 DIAGNOSIS — I87309 Chronic venous hypertension (idiopathic) without complications of unspecified lower extremity: Secondary | ICD-10-CM | POA: Insufficient documentation

## 2010-11-11 LAB — POCT I-STAT, CHEM 8
BUN: 92 mg/dL — ABNORMAL HIGH (ref 6–23)
Chloride: 108 mEq/L (ref 96–112)
Creatinine, Ser: 12.1 mg/dL — ABNORMAL HIGH (ref 0.4–1.5)
Sodium: 142 mEq/L (ref 135–145)
TCO2: 25 mmol/L (ref 0–100)

## 2010-11-12 ENCOUNTER — Other Ambulatory Visit: Payer: Self-pay | Admitting: Internal Medicine

## 2010-11-12 ENCOUNTER — Encounter (HOSPITAL_COMMUNITY): Payer: BC Managed Care – PPO | Attending: Internal Medicine

## 2010-11-12 DIAGNOSIS — N186 End stage renal disease: Secondary | ICD-10-CM | POA: Insufficient documentation

## 2010-11-12 DIAGNOSIS — D631 Anemia in chronic kidney disease: Secondary | ICD-10-CM | POA: Insufficient documentation

## 2010-11-13 ENCOUNTER — Encounter (INDEPENDENT_AMBULATORY_CARE_PROVIDER_SITE_OTHER): Payer: BC Managed Care – PPO

## 2010-11-13 ENCOUNTER — Ambulatory Visit (INDEPENDENT_AMBULATORY_CARE_PROVIDER_SITE_OTHER): Payer: BC Managed Care – PPO | Admitting: Vascular Surgery

## 2010-11-13 DIAGNOSIS — Z01810 Encounter for preprocedural cardiovascular examination: Secondary | ICD-10-CM

## 2010-11-13 DIAGNOSIS — N186 End stage renal disease: Secondary | ICD-10-CM

## 2010-11-13 DIAGNOSIS — N184 Chronic kidney disease, stage 4 (severe): Secondary | ICD-10-CM

## 2010-11-13 LAB — POCT HEMOGLOBIN-HEMACUE: Hemoglobin: 12.8 g/dL — ABNORMAL LOW (ref 13.0–17.0)

## 2010-11-13 NOTE — Op Note (Signed)
  NAMEKAMAEHU, OSMANSKI             ACCOUNT NO.:  [id number]  MEDICAL RECORD NO.:  [id number]           PATIENT TYPE:  O  LOCATION:  SDSC                         FACILITY:  [facility]  PHYSICIAN:  [name]. 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  PROCEDURES: 1. Ultrasound-guided access to the left upper extremity arteriovenous     fistula. 2. Left upper extremity fistulogram. 3. Right upper extremity central venogram.  SURGEON:  [name]. Scot Dock, MD  ANESTHESIA:  Local.  TOTAL CONTRAST:  25 mL of Visipaque.  FINDINGS: 1. Left brachial cephalic vein occlusion. 2. Moderate narrowing of the right brachiocephalic vein at     approximately 60% stenosis.  TECHNIQUE:  The patient was taken to the PV lab and the left upper extremity was prepped and draped in usual sterile fashion.  After the skin was anesthetized with 1% lidocaine under ultrasound guidance, the left AV fistula was cannulated.  Initially, it was difficult threading the wire.  Therefore, we removed the needle to hold pressure for hemostasis, elected to cannulate higher on the vein where it was slightly larger.  The skin was anesthetized.  Again under ultrasound guidance, the left AV fistula was cannulated and the guidewire introduced into the fistula.  Under fluoroscopic control, the micropuncture sheath was introduced over the wire.  A fistulogram was obtained through the sheath looking at the central veins.  There was a left brachiocephalic vein occlusion over a long segment with extensive collaterals.  This was not something that was amenable to angioplasty. The upper arm cephalic  vein that could be visualized was widely patent as was the junction of the cephalic vein to the subclavian vein.  Knowing that there was no further options revision on the left side, I elected to a central venogram on the right to evaluate him for access of the right arm.  The patient had IV in the right arm and contrast was injected to the IV to evaluate the central veins on the right.  The brachial and basilic veins are patent on the right as is the upper arm cephalic vein.  There is a 60% narrowing of the proximal brachial cephalic vein on the right, although this is patent. Given the dye considerations, I did not want to was address this stenosis at this time.  I thought that he could potentially undergo new access in the right arm if the swelling was an issue, and this could be addressed with a fistulogram.     [name]. Scot Dock, M.D.     CSD/MEDQ  D:  11/11/2010  T:  11/12/2010  Job:  [id number]  cc:   [facility]  Electronically Signed by Deitra Mayo M.D. on 11/13/2010 12:29:59 PM

## 2010-11-14 NOTE — Assessment & Plan Note (Signed)
OFFICE VISIT  [name], [name] DOB:  May 13, 1951                                       11/13/2010 [id number]  I saw this patient in the office today to discuss further access.  He had a left forearm AV fistula placed on September 12, 2010 and developed significant swelling in the left arm.  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 Right upper extremity central venogram demonstrated an approximately 60% stenosis in the right brachial cephalic vein but the vein was patent. He comes in to evaluate for access in the right arm.  The swelling in the left arm has some persistent.  He has had no recent uremic symptoms and he is currently not on dialysis.  He has had no nausea, vomiting, palpitations, anorexia or fatigue.  PHYSICAL EXAMINATION:  Vital signs:  On examination, blood pressure is 175/104, heart rate is 66.  Lungs:  Clear bilaterally to auscultation. He has swelling in the left arm all the way up to the shoulder.  He has a good thrill and bruit in the left forearm AV fistula.  He has a palpable brachial and radial pulse on the right.  He did have an arterial Doppler study today which shows normal biphasic waveforms throughout the arterial system with no evidence of atherosclerosis.  Forearm and upper arm cephalic vein mapping shows inadequate cephalic vein for AV fistula.  Likewise, the basilic vein was good on the right side.  I have recommended we place a radiocephalic fistula on the right and ligate his left forearm AV fistula because of the swelling in the left arm.  He does have a [id number] brachial cephalic stenosis on the right and is at risk for developing swelling in the right arm but given that the vein is not occluded, I think if this becomes an issue, he could potentially have venous angioplasty of this if  needed.  His surgeries has been scheduled for November 26, 2010.    [name]. Scot Dock, M.D. Electronically Signed  CSD/MEDQ  D:  11/13/2010  T:  11/14/2010  Job:  [id number]  cc:   [facility]

## 2010-11-14 NOTE — Procedures (Unsigned)
VASCULAR LAB EXAM INDICATION:  A 3-week follow-up of left AVF placement.  History of chronic kidney disease.  HISTORY: Diabetes:  Yes. Cardiac:  No. Hypertension:  Yes. Left radiocephalic AVF placed [id number].  EXAM:  Patent left radiocephalic fistula.  IMPRESSION:  Patent left radiocephalic arteriovenous fistula with diameter and depth and velocity measurements found on the following worksheet.  ___________________________________________ [name]. Scot Dock, M.D.  EM/MEDQ  D:  11/06/2010  T:  11/06/2010  Job:  [id number]

## 2010-11-20 ENCOUNTER — Encounter (HOSPITAL_COMMUNITY): Payer: BC Managed Care – PPO

## 2010-11-20 ENCOUNTER — Other Ambulatory Visit: Payer: Self-pay | Admitting: Internal Medicine

## 2010-11-20 LAB — POCT HEMOGLOBIN-HEMACUE: Hemoglobin: 13.1 g/dL (ref 13.0–17.0)

## 2010-11-26 ENCOUNTER — Ambulatory Visit (HOSPITAL_COMMUNITY)
Admission: RE | Admit: 2010-11-26 | Discharge: 2010-11-26 | Disposition: A | Discharge status: Home / Self Care | Payer: BC Managed Care – PPO | Source: Ambulatory Visit | Attending: Vascular Surgery | Admitting: Vascular Surgery

## 2010-11-26 DIAGNOSIS — Y832 Surgical operation with anastomosis, bypass or graft as the cause of abnormal reaction of the patient, or of later complication, without mention of misadventure at the time of the procedure: Secondary | ICD-10-CM | POA: Insufficient documentation

## 2010-11-26 DIAGNOSIS — I120 Hypertensive chronic kidney disease with stage 5 chronic kidney disease or end stage renal disease: Secondary | ICD-10-CM | POA: Insufficient documentation

## 2010-11-26 DIAGNOSIS — I82291 Chronic embolism and thrombosis of other thoracic veins: Secondary | ICD-10-CM | POA: Insufficient documentation

## 2010-11-26 DIAGNOSIS — Z01812 Encounter for preprocedural laboratory examination: Secondary | ICD-10-CM | POA: Insufficient documentation

## 2010-11-26 DIAGNOSIS — N186 End stage renal disease: Secondary | ICD-10-CM

## 2010-11-26 DIAGNOSIS — T82898A Other specified complication of vascular prosthetic devices, implants and grafts, initial encounter: Secondary | ICD-10-CM | POA: Insufficient documentation

## 2010-11-26 DIAGNOSIS — E119 Type 2 diabetes mellitus without complications: Secondary | ICD-10-CM | POA: Insufficient documentation

## 2010-11-26 LAB — POCT I-STAT 4, (NA,K, GLUC, HGB,HCT)
Glucose, Bld: 91 mg/dL (ref 70–99)
HCT: 36 % — ABNORMAL LOW (ref 39.0–52.0)
Potassium: 3.6 mEq/L (ref 3.5–5.1)
Sodium: 142 mEq/L (ref 135–145)

## 2010-11-26 LAB — GLUCOSE, CAPILLARY: Glucose-Capillary: 92 mg/dL (ref 70–99)

## 2010-11-28 NOTE — Procedures (Unsigned)
VASCULAR LAB EXAM  INDICATION:  Arteriovenous fistula placement.  History of chronic kidney disease.  HISTORY: Diabetes:  No. Cardiac:  No. Hypertension:  Yes.  EXAM:  IMPRESSION:  Patent right radial artery measurements ranging from 0.38 to 0.46 cm from the wrist to the antecubital fossa.  Patent right ulnar artery, measurements ranging from 0.26 to 0.30 from the wrist to the antecubital fossa.  Patent right brachial artery measuring 0.57 to 0.67 cm from the antecubital fossa to the distal humerus.  Normal arterial waveforms visualized and no evidence of atherosclerosis visualized within the arteries.  ___________________________________________ [name]. Scot Dock, M.D.  OD/MEDQ  D:  11/13/2010  T:  11/13/2010  Job:  [id number]

## 2010-11-28 NOTE — Op Note (Signed)
  NAMERICHAD, DEHARO NO.:  [id number]  MEDICAL RECORD NO.:  [id number]           PATIENT TYPE:  O  LOCATION:  [facility]SC                         FACILITY:  [facility]  PHYSICIAN:  [name]. 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  PROCEDURE:  Ligation of left forearm arteriovenous fistula.  SURGEON:  [name]. Scot Dock, MD  ANESTHESIA:  Local with sedation.  TECHNIQUE:  The patient was taken to the operating room sedated by Anesthesia.  The left upper extremity was prepped and draped in the usual sterile fashion.  After the skin was anesthetized with 1% lidocaine with epinephrine, a small transverse incision was made over the fistula just above the previous incision.  The vein was ligated with two 2-0 silk ties after it was dissected free.  There was still a palpable radial pulse.  Hemostasis was obtained in the wound.  The wound was closed with two interrupted 3-0 Vicryl and the skin was closed with a 4-0 subcuticular stitch.  Dermabond was applied.  The patient tolerated the procedure well and was transferred to the recovery room in stable condition.  All needle and sponge counts were correct.     [name]. Scot Dock, M.D.     CSD/MEDQ  D:  11/26/2010  T:  11/26/2010  Job:  [id number]  Electronically Signed by Deitra Mayo M.D. on 11/28/2010 01:10:21 PM

## 2010-11-28 NOTE — Procedures (Unsigned)
CEPHALIC VEIN MAPPING  INDICATION:  Arteriovenous fistula placement.  HISTORY: Chronic kidney disease.  EXAM:  The right cephalic vein is compressible.  Diameter measurements range from 0.32 to 0.60 cm.  The right basilic vein is compressible.  Diameter measurements range from 0.42 to 0.80 cm.  See attached worksheet for all measurements.  IMPRESSION:  Patent right cephalic and basilic veins with diameter measurements as described above.  ___________________________________________ [name]. Scot Dock, M.D.  OD/MEDQ  D:  11/13/2010  T:  11/13/2010  Job:  [id number]

## 2010-12-13 ENCOUNTER — Other Ambulatory Visit (HOSPITAL_COMMUNITY): Payer: BC Managed Care – PPO

## 2010-12-31 ENCOUNTER — Ambulatory Visit (HOSPITAL_COMMUNITY): Payer: BC Managed Care – PPO

## 2010-12-31 ENCOUNTER — Ambulatory Visit (HOSPITAL_COMMUNITY)
Admission: RE | Admit: 2010-12-31 | Discharge: 2010-12-31 | Disposition: A | Discharge status: Home / Self Care | Payer: BC Managed Care – PPO | Source: Ambulatory Visit | Attending: Vascular Surgery | Admitting: Vascular Surgery

## 2010-12-31 DIAGNOSIS — I120 Hypertensive chronic kidney disease with stage 5 chronic kidney disease or end stage renal disease: Secondary | ICD-10-CM

## 2010-12-31 DIAGNOSIS — N186 End stage renal disease: Secondary | ICD-10-CM | POA: Insufficient documentation

## 2010-12-31 DIAGNOSIS — Z01812 Encounter for preprocedural laboratory examination: Secondary | ICD-10-CM | POA: Insufficient documentation

## 2010-12-31 DIAGNOSIS — Z01818 Encounter for other preprocedural examination: Secondary | ICD-10-CM | POA: Insufficient documentation

## 2010-12-31 DIAGNOSIS — Z992 Dependence on renal dialysis: Secondary | ICD-10-CM | POA: Insufficient documentation

## 2010-12-31 DIAGNOSIS — T82898A Other specified complication of vascular prosthetic devices, implants and grafts, initial encounter: Secondary | ICD-10-CM

## 2010-12-31 HISTORY — PX: AV FISTULA PLACEMENT, RADIOCEPHALIC: SHX1208

## 2010-12-31 LAB — POCT I-STAT 4, (NA,K, GLUC, HGB,HCT)
Glucose, Bld: 99 mg/dL (ref 70–99)
Potassium: 3.9 mEq/L (ref 3.5–5.1)

## 2011-01-01 NOTE — Op Note (Signed)
  NAMEISAIS, SADLOWSKI             ACCOUNT NO.:  [id number]  MEDICAL RECORD NO.:  [id number]           PATIENT TYPE:  O  LOCATION:  SDSC                         FACILITY:  [facility]  PHYSICIAN:  [name]. Scot Dock, M.D.DATE OF BIRTH:  07/21/1951  DATE OF PROCEDURE: DATE OF DISCHARGE:  12/31/2010                              OPERATIVE REPORT   PREOPERATIVE DIAGNOSIS:  End-stage renal disease  POSTOPERATIVE DIAGNOSIS:  End-stage renal disease  PROCEDURE:  Right radiocephalic AV fistula and ligation of 2 competing branches.  SURGEON:  [name]. Scot Dock, MD.  ANESTHESIA:  Local with sedation.  TECHNIQUE:  The patient was taken to the operating room and sedated by anesthesia.  The right upper extremity was prepped and draped in usual sterile fashion.  The forearm cephalic vein appeared adequate by duplex. A oblique incision was made at the wrist after the skin was anesthetized.  Here the cephalic vein was dissected free with branches divided between clips and 3-0 silk ties.  It was ligated distally and irrigated up nicely with heparinized saline.  Through the same incision, radial artery was dissected free and mobilized.  The patient was then heparinized.  The radial artery was clamped proximally and distally and a longitudinal arteriotomy was made.  The vein was mobilized over and spatulated and then the arteriotomy was made and extended and the vein sewn end-to-side to the artery using continuous 6-0 Prolene suture.  At the completion, there was an excellent thrill in the fistula.  There was one large competing branch noted further up to forearm.  A small separate incision was made over this area after the skin was anesthetized with 1% lidocaine.  This vein was ligated with the 2-0 silk tie.  An additional branch was clipped.  This small incision was closed with a 4-0 subcuticular stitch.  The incision at the wrist was closed with deep layer of 3-0 Vicryl and the skin  closed with 4-0 Vicryl. Sterile dressing was applied.  The patient tolerated the procedure well and was transferred to the recovery room in stable condition.  All needle and sponge counts were correct.     [name]. Scot Dock, M.D.     CSD/MEDQ  D:  12/31/2010  T:  01/01/2011  Job:  [id number]  Electronically Signed by Deitra Mayo M.D. on 01/01/2011 02:46:44 PM

## 2011-01-15 ENCOUNTER — Encounter (HOSPITAL_COMMUNITY): Payer: BC Managed Care – PPO

## 2011-01-23 ENCOUNTER — Encounter (HOSPITAL_COMMUNITY)
Admission: RE | Admit: 2011-01-23 | Discharge: 2011-01-23 | Disposition: A | Discharge status: Home / Self Care | Payer: BC Managed Care – PPO | Source: Ambulatory Visit | Attending: Internal Medicine | Admitting: Internal Medicine

## 2011-01-23 ENCOUNTER — Other Ambulatory Visit: Payer: Self-pay | Admitting: Nephrology

## 2011-01-23 DIAGNOSIS — N186 End stage renal disease: Secondary | ICD-10-CM | POA: Insufficient documentation

## 2011-01-23 DIAGNOSIS — D631 Anemia in chronic kidney disease: Secondary | ICD-10-CM | POA: Insufficient documentation

## 2011-01-29 ENCOUNTER — Ambulatory Visit: Payer: BC Managed Care – PPO | Admitting: Vascular Surgery

## 2011-01-30 ENCOUNTER — Ambulatory Visit (INDEPENDENT_AMBULATORY_CARE_PROVIDER_SITE_OTHER): Payer: BC Managed Care – PPO | Admitting: Internal Medicine

## 2011-01-30 ENCOUNTER — Other Ambulatory Visit: Payer: Self-pay | Admitting: Nephrology

## 2011-01-30 ENCOUNTER — Encounter: Payer: Self-pay | Admitting: Internal Medicine

## 2011-01-30 ENCOUNTER — Encounter (HOSPITAL_COMMUNITY): Payer: BC Managed Care – PPO

## 2011-01-30 VITALS — BP 134/80 | HR 81 | Temp 97.9°F | Resp 16 | Wt 180.0 lb

## 2011-01-30 DIAGNOSIS — N19 Unspecified kidney failure: Secondary | ICD-10-CM

## 2011-01-30 DIAGNOSIS — Z940 Kidney transplant status: Secondary | ICD-10-CM | POA: Insufficient documentation

## 2011-01-30 DIAGNOSIS — I10 Essential (primary) hypertension: Secondary | ICD-10-CM

## 2011-01-30 DIAGNOSIS — Z9489 Other transplanted organ and tissue status: Secondary | ICD-10-CM | POA: Insufficient documentation

## 2011-01-30 DIAGNOSIS — Z23 Encounter for immunization: Secondary | ICD-10-CM

## 2011-01-30 MED ORDER — PNEUMOCOCCAL VAC POLYVALENT 25 MCG/0.5ML IJ INJ: 0.5000 mL | INJECTION | Freq: Once | INTRAMUSCULAR | Status: DC

## 2011-01-30 NOTE — Patient Instructions (Signed)

## 2011-01-30 NOTE — Progress Notes (Signed)
Subjective:    Patient ID: [name], male    DOB: 06-30-1951, 60 y.o.   MRN: [id number]  HPI He returns for f/up and requests that he start getting the prerequisites done to be a kidney transplant recipient at [facility] (see scanned sheet that has the requested items.) He is seeing Dr. Posey Pronto about his kidney failure and he is on procrit and he tells me that he got an injection this morning and that he had some labs done earlier today. He is not on dialysis at this time. He tells me that he had a DVT in his left upper arm several months ago and that Dr. Doren Custard had to remove the fistula graft in his left arm and that he got a new graft in his right arm about one month ago and that it is doing well with no pain or swelling and that all of the pain and swelling in his left arm has resolved.  Review of Systems  Constitutional: Negative for fever, chills, diaphoresis, activity change, appetite change, fatigue and unexpected weight change.  Eyes: Negative.   Respiratory: Negative for cough, shortness of breath, wheezing and stridor.   Cardiovascular: Negative for chest pain, palpitations and leg swelling.  Gastrointestinal: Negative.   Genitourinary: Negative for dysuria, urgency, frequency, hematuria, flank pain, decreased urine volume, enuresis and difficulty urinating.  Musculoskeletal: Negative.   Neurological: Negative.   Hematological: Negative for adenopathy. Does not bruise/bleed easily.  Psychiatric/Behavioral: Positive for sleep disturbance (mild DFA). Negative for suicidal ideas, hallucinations, behavioral problems, confusion, self-injury, dysphoric mood, decreased concentration and agitation. The patient is not nervous/anxious and is not hyperactive.        Objective:   Physical Exam  Vitals reviewed. Constitutional: He is oriented to person, place, and time. He appears well-developed and well-nourished. No distress.  HENT:  Head: Normocephalic and atraumatic.  Right Ear:  External ear normal.  Left Ear: External ear normal.  Nose: Nose normal.  Mouth/Throat: Oropharynx is clear and moist. No oropharyngeal exudate.  Eyes: Conjunctivae and EOM are normal. Pupils are equal, round, and reactive to light. Right eye exhibits no discharge. Left eye exhibits no discharge. No scleral icterus.  Neck: Normal range of motion. Neck supple. No JVD present. No tracheal deviation present. No thyromegaly present.  Cardiovascular: Normal rate, regular rhythm, normal heart sounds and intact distal pulses.  Exam reveals no gallop, no S3, no S4 and no friction rub.   No murmur heard. Pulses:      Carotid pulses are 2+ on the right side, and 2+ on the left side.      Radial pulses are 2+ on the right side, and 2+ on the left side.       Femoral pulses are 2+ on the right side, and 2+ on the left side.      Popliteal pulses are 2+ on the right side, and 2+ on the left side.       Dorsalis pedis pulses are 2+ on the right side, and 2+ on the left side.       Posterior tibial pulses are 2+ on the right side, and 2+ on the left side.  Pulmonary/Chest: Effort normal and breath sounds normal. No stridor. No respiratory distress. He has no wheezes. He has no rales. He exhibits no tenderness.  Abdominal: Soft. Bowel sounds are normal. He exhibits no distension. There is no tenderness. There is no rebound and no guarding.  Musculoskeletal: Normal range of motion. He exhibits no edema and  no tenderness.  Lymphadenopathy:    He has no cervical adenopathy.  Neurological: He is alert and oriented to person, place, and time. He has normal reflexes. He displays normal reflexes. No cranial nerve deficit. He exhibits normal muscle tone. Coordination normal.  Skin: Skin is warm and dry. No rash noted. He is not diaphoretic. No erythema. No pallor.  Psychiatric: He has a normal mood and affect. His behavior is normal. Judgment and thought content normal.        Lab Results  Component Value Date     WBC 7.8 09/12/2010   HGB 8.5* 01/30/2011   HCT 31.0* 12/31/2010   PLT 158 09/12/2010   CHOL  Value: 214        ATP III CLASSIFICATION:  <200     mg/dL   Desirable  200-239  mg/dL   Borderline High  >=240    mg/dL   High       * 09/08/2010   TRIG 56 09/08/2010   HDL 66 09/08/2010   ALT 43 09/09/2010   AST 29 09/09/2010   NA 139 12/31/2010   K 3.9 12/31/2010   CL 108 11/11/2010   CREATININE 12.1* 11/11/2010   BUN 92* 11/11/2010   CO2 22 09/12/2010   TSH 1.775 09/08/2010   HGBA1C  Value: 5.3 (NOTE)                                                                       According to the ADA Clinical Practice Recommendations for 2011, when HbA1c is used as a screening test:   >=6.5%   Diagnostic of Diabetes Mellitus           (if abnormal result  is confirmed)  5.7-6.4%   Increased risk of developing Diabetes Mellitus  References:Diagnosis and Classification of Diabetes Mellitus,Diabetes [id number] 1):S62-S69 and Standards of Medical Care in         Diabetes - 2011,Diabetes [id number]  (Suppl 1):S11-S61. 09/08/2010    Assessment & Plan:

## 2011-01-30 NOTE — Assessment & Plan Note (Signed)
I have started the process of getting the requirements for him to receive a kidney transplant- colonoscopy, dental exam, Hep B/A vaccines, Pneumovax.

## 2011-01-30 NOTE — Assessment & Plan Note (Signed)
His BP is well controlled today

## 2011-01-31 ENCOUNTER — Ambulatory Visit: Payer: BC Managed Care – PPO

## 2011-02-04 ENCOUNTER — Encounter (HOSPITAL_COMMUNITY): Payer: BC Managed Care – PPO

## 2011-02-13 ENCOUNTER — Other Ambulatory Visit: Payer: Self-pay | Admitting: Nephrology

## 2011-02-13 ENCOUNTER — Encounter (HOSPITAL_COMMUNITY): Payer: BC Managed Care – PPO | Attending: Internal Medicine

## 2011-02-13 DIAGNOSIS — D631 Anemia in chronic kidney disease: Secondary | ICD-10-CM | POA: Insufficient documentation

## 2011-02-13 DIAGNOSIS — N186 End stage renal disease: Secondary | ICD-10-CM | POA: Insufficient documentation

## 2011-02-14 LAB — POCT HEMOGLOBIN-HEMACUE: Hemoglobin: 8.2 g/dL — ABNORMAL LOW (ref 13.0–17.0)

## 2011-02-20 ENCOUNTER — Other Ambulatory Visit: Payer: Self-pay | Admitting: Nephrology

## 2011-02-20 ENCOUNTER — Encounter (HOSPITAL_COMMUNITY): Payer: BC Managed Care – PPO

## 2011-02-20 LAB — RENAL FUNCTION PANEL
Albumin: 3.7 g/dL (ref 3.5–5.2)
Chloride: 98 mEq/L (ref 96–112)
Creatinine, Ser: 13.71 mg/dL — ABNORMAL HIGH (ref 0.50–1.35)
GFR calc non Af Amer: 4 mL/min — ABNORMAL LOW (ref >60)
Phosphorus: 5.2 mg/dL — ABNORMAL HIGH (ref 2.3–4.6)
Potassium: 4.4 mEq/L (ref 3.5–5.1)

## 2011-02-20 LAB — IRON AND TIBC: TIBC: 235 ug/dL (ref 215–435)

## 2011-02-20 LAB — MAGNESIUM: Magnesium: 2.4 mg/dL (ref 1.5–2.5)

## 2011-02-21 LAB — POCT HEMOGLOBIN-HEMACUE: Hemoglobin: 8.3 g/dL — ABNORMAL LOW (ref 13.0–17.0)

## 2011-02-27 ENCOUNTER — Encounter: Payer: Self-pay | Admitting: Internal Medicine

## 2011-02-27 ENCOUNTER — Ambulatory Visit (INDEPENDENT_AMBULATORY_CARE_PROVIDER_SITE_OTHER): Payer: BC Managed Care – PPO | Admitting: Internal Medicine

## 2011-02-27 ENCOUNTER — Encounter (HOSPITAL_COMMUNITY): Payer: BC Managed Care – PPO

## 2011-02-27 ENCOUNTER — Other Ambulatory Visit: Payer: Self-pay | Admitting: Nephrology

## 2011-02-27 DIAGNOSIS — I10 Essential (primary) hypertension: Secondary | ICD-10-CM

## 2011-02-27 DIAGNOSIS — Z9489 Other transplanted organ and tissue status: Secondary | ICD-10-CM

## 2011-02-27 DIAGNOSIS — Z23 Encounter for immunization: Secondary | ICD-10-CM

## 2011-02-27 DIAGNOSIS — N19 Unspecified kidney failure: Secondary | ICD-10-CM

## 2011-02-27 NOTE — Patient Instructions (Signed)

## 2011-02-28 ENCOUNTER — Encounter: Payer: Self-pay | Admitting: Vascular Surgery

## 2011-02-28 NOTE — Assessment & Plan Note (Signed)
He will continue to see nephrology. 

## 2011-02-28 NOTE — Progress Notes (Signed)
  Subjective:    Patient ID: [name], male    DOB: Jan 02, 1951, 60 y.o.   MRN: [id number]  Hypertension This is a chronic problem. The current episode started more than 1 year ago. The problem has been gradually improving since onset. The problem is controlled. Pertinent negatives include no anxiety, blurred vision, chest pain, headaches, malaise/fatigue, neck pain, orthopnea, palpitations, peripheral edema, PND, shortness of breath or sweats. There are no associated agents to hypertension. Past treatments include calcium channel blockers, beta blockers, alpha 1 blockers and direct vasodilators. The current treatment provides significant improvement. There are no compliance problems.  Hypertensive end-organ damage includes kidney disease.      Review of Systems  Constitutional: Negative.  Negative for malaise/fatigue.  HENT: Negative.  Negative for neck pain.   Eyes: Negative.  Negative for blurred vision.  Respiratory: Negative for apnea, cough, choking, chest tightness, shortness of breath, wheezing and stridor.   Cardiovascular: Negative for chest pain, palpitations, orthopnea, leg swelling and PND.  Gastrointestinal: Negative.   Genitourinary: Negative.   Musculoskeletal: Negative.   Skin: Negative.   Neurological: Negative.  Negative for headaches.  Hematological: Negative.   Psychiatric/Behavioral: Negative.        Objective:   Physical Exam  Vitals reviewed. Constitutional: He is oriented to person, place, and time. He appears well-developed and well-nourished. No distress.  HENT:  Head: Normocephalic and atraumatic.  Right Ear: External ear normal.  Left Ear: External ear normal.  Nose: Nose normal.  Mouth/Throat: Oropharynx is clear and moist. No oropharyngeal exudate.  Eyes: Conjunctivae and EOM are normal. Pupils are equal, round, and reactive to light. Right eye exhibits no discharge. Left eye exhibits no discharge. No scleral icterus.  Neck: Normal range of  motion. Neck supple. No JVD present. No tracheal deviation present. No thyromegaly present.  Cardiovascular: Normal rate, regular rhythm, normal heart sounds and intact distal pulses.  Exam reveals no gallop and no friction rub.   No murmur heard. Pulmonary/Chest: Effort normal and breath sounds normal. No stridor. No respiratory distress. He has no wheezes. He has no rales. He exhibits no tenderness.  Abdominal: Soft. Bowel sounds are normal. He exhibits no distension and no mass. There is no tenderness. There is no rebound and no guarding.  Musculoskeletal: Normal range of motion. He exhibits no edema and no tenderness.  Lymphadenopathy:    He has no cervical adenopathy.  Neurological: He is alert and oriented to person, place, and time. He has normal reflexes.  Skin: Skin is warm and dry. No rash noted. He is not diaphoretic. No erythema. No pallor.  Psychiatric: He has a normal mood and affect. His behavior is normal. Judgment and thought content normal.          Assessment & Plan:

## 2011-02-28 NOTE — Assessment & Plan Note (Signed)
His BP is well controlled 

## 2011-02-28 NOTE — Assessment & Plan Note (Signed)
Hep b vaccine #2 was given today

## 2011-03-04 ENCOUNTER — Ambulatory Visit (AMBULATORY_SURGERY_CENTER): Payer: BC Managed Care – PPO

## 2011-03-04 VITALS — Ht 70.0 in | Wt 189.4 lb

## 2011-03-04 DIAGNOSIS — Z1211 Encounter for screening for malignant neoplasm of colon: Secondary | ICD-10-CM

## 2011-03-04 MED ORDER — PEG-KCL-NACL-NASULF-NA ASC-C 100 G PO SOLR: 1.0000 | Freq: Once | ORAL | Status: AC

## 2011-03-05 ENCOUNTER — Encounter: Payer: Self-pay | Admitting: Internal Medicine

## 2011-03-05 ENCOUNTER — Ambulatory Visit (INDEPENDENT_AMBULATORY_CARE_PROVIDER_SITE_OTHER): Payer: BC Managed Care – PPO | Admitting: Vascular Surgery

## 2011-03-05 ENCOUNTER — Encounter: Payer: Self-pay | Admitting: Vascular Surgery

## 2011-03-05 VITALS — BP 166/91 | HR 58 | Temp 97.4°F | Ht 70.0 in | Wt 185.0 lb

## 2011-03-05 DIAGNOSIS — N186 End stage renal disease: Secondary | ICD-10-CM

## 2011-03-05 NOTE — Progress Notes (Signed)
[name] is a 60 y.o. male patient. 1. End stage renal disease    Past Medical History  Diagnosis Date  . Headache   . Hyperlipidemia   . HTN (hypertension)   . Renal failure   . Anemia   . 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 Gout    Current Outpatient Prescriptions  Medication Sig Dispense Refill  . aspirin 81 MG EC tablet Take 81 mg by mouth daily.        Marland Kitchen atorvastatin (LIPITOR) 10 MG tablet Take 10 mg by mouth daily.        . calcitRIOL (ROCALTROL) 0.5 MCG capsule Take 0.5 mcg by mouth daily.        . Calcium Acetate 667 MG TABS Take 2 tablets by mouth 3 (three) times daily with meals.        . carvedilol (COREG) 12.5 MG tablet Take 12.5 mg by mouth 2 (two) times daily.        . hydrALAZINE (APRESOLINE) 25 MG tablet Take 25 mg by mouth 3 (three) times daily.        Marland Kitchen NIFEdipine (PROCARDIA-XL) 60 MG (OSM) 24 hr tablet Take 60 mg by mouth daily.        . peg 3350 powder (MOVIPREP) 100 G SOLR Take 1 kit (100 g total) by mouth once.  1 kit  0   Current Facility-Administered Medications  Medication Dose Route Frequency Provider Last Rate Last Dose  . pneumococcal 23 valent vaccine (PNU-IMMUNE) injection 0.5 mL  0.5 mL Intramuscular Once Janith Lima, MD       Allergies  Allergen Reactions  . Hydrocodone Itching   Active Problems:  * No active hospital problems. *   Blood pressure 166/91, pulse 58, temperature 97.4 F (36.3 C), temperature source Oral, height 5\' 10"  (1.778 m), weight 185 lb (83.915 kg).  Subjective Is is a 60 year old gentleman who had a right radiocephalic AV fistula placed on 12/31/2010. He comes in for a routine followup visit. He has no specific complaints referrable to the fistula. He is not on dialysis. He has not had any uremic symptoms. Objective On exam, he has an excellent thrill in his right forearm AV fistula. He has a palpable radial pulse. His incision is healed nicely. Assessment & Plan Overall I am  pleased with the maturation of this fistula. I think if he needs dialysis this should provide adequate access. We'll see him back prn. [name] 03/05/2011

## 2011-03-05 NOTE — Progress Notes (Signed)
Postop Right Radiocephalic AVF on [id number],  Pt has had no drainage, pain in hand and has been afebrile since surgery.  He hasn't started HD yet.

## 2011-03-07 ENCOUNTER — Other Ambulatory Visit: Payer: Self-pay | Admitting: Nephrology

## 2011-03-07 ENCOUNTER — Encounter (HOSPITAL_COMMUNITY): Payer: BC Managed Care – PPO

## 2011-03-07 LAB — RENAL FUNCTION PANEL
Albumin: 3.7 g/dL (ref 3.5–5.2)
GFR calc Af Amer: 5 mL/min — ABNORMAL LOW (ref >60)
GFR calc non Af Amer: 4 mL/min — ABNORMAL LOW (ref >60)
Glucose, Bld: 68 mg/dL — ABNORMAL LOW (ref 70–99)
Phosphorus: 6.2 mg/dL — ABNORMAL HIGH (ref 2.3–4.6)
Potassium: 4.3 mEq/L (ref 3.5–5.1)
Sodium: 143 mEq/L (ref 135–145)

## 2011-03-07 LAB — MAGNESIUM: Magnesium: 2.7 mg/dL — ABNORMAL HIGH (ref 1.5–2.5)

## 2011-03-07 LAB — IRON AND TIBC: Saturation Ratios: 24 % (ref 20–55)

## 2011-03-07 LAB — POCT HEMOGLOBIN-HEMACUE: Hemoglobin: 9.9 g/dL — ABNORMAL LOW (ref 13.0–17.0)

## 2011-03-14 ENCOUNTER — Encounter: Payer: Self-pay | Admitting: Internal Medicine

## 2011-03-14 ENCOUNTER — Ambulatory Visit (AMBULATORY_SURGERY_CENTER): Payer: BC Managed Care – PPO | Admitting: Internal Medicine

## 2011-03-14 VITALS — BP 145/73 | HR 67 | Temp 97.3°F | Resp 18 | Ht 70.0 in | Wt 189.0 lb

## 2011-03-14 DIAGNOSIS — Z1211 Encounter for screening for malignant neoplasm of colon: Secondary | ICD-10-CM

## 2011-03-14 DIAGNOSIS — K635 Polyp of colon: Secondary | ICD-10-CM

## 2011-03-14 DIAGNOSIS — K5289 Other specified noninfective gastroenteritis and colitis: Secondary | ICD-10-CM

## 2011-03-14 DIAGNOSIS — D126 Benign neoplasm of colon, unspecified: Secondary | ICD-10-CM

## 2011-03-14 MED ORDER — SODIUM CHLORIDE 0.9 % IV SOLN: 500.0000 mL | INTRAVENOUS | Status: DC

## 2011-03-14 NOTE — Patient Instructions (Signed)
Please read the handouts given to you by your recovery room nurse.   Your biopsy results will be mailed to you within 2 weeks.   You may resu,e your routine medications today.    Please call if you have any concerns at [phone]. Thank-you.

## 2011-03-17 ENCOUNTER — Other Ambulatory Visit: Payer: Self-pay | Admitting: Nephrology

## 2011-03-17 ENCOUNTER — Telehealth: Payer: Self-pay

## 2011-03-17 ENCOUNTER — Encounter (HOSPITAL_COMMUNITY): Payer: BC Managed Care – PPO | Attending: Internal Medicine

## 2011-03-17 DIAGNOSIS — D631 Anemia in chronic kidney disease: Secondary | ICD-10-CM | POA: Insufficient documentation

## 2011-03-17 DIAGNOSIS — N186 End stage renal disease: Secondary | ICD-10-CM | POA: Insufficient documentation

## 2011-03-17 NOTE — Telephone Encounter (Signed)

## 2011-03-18 ENCOUNTER — Encounter: Payer: Self-pay | Admitting: Internal Medicine

## 2011-03-24 ENCOUNTER — Encounter (HOSPITAL_COMMUNITY): Payer: BC Managed Care – PPO

## 2011-03-24 ENCOUNTER — Other Ambulatory Visit: Payer: Self-pay | Admitting: Nephrology

## 2011-04-07 ENCOUNTER — Encounter (HOSPITAL_COMMUNITY): Payer: BC Managed Care – PPO

## 2011-04-07 ENCOUNTER — Other Ambulatory Visit: Payer: Self-pay | Admitting: Nephrology

## 2011-04-07 LAB — RENAL FUNCTION PANEL
Albumin: 3.6 g/dL (ref 3.5–5.2)
CO2: 28 mEq/L (ref 19–32)
Chloride: 98 mEq/L (ref 96–112)
GFR calc Af Amer: 4 mL/min — ABNORMAL LOW (ref >60)
GFR calc non Af Amer: 3 mL/min — ABNORMAL LOW (ref >60)
Potassium: 3.8 mEq/L (ref 3.5–5.1)
Sodium: 141 mEq/L (ref 135–145)

## 2011-04-07 LAB — IRON AND TIBC
Iron: 114 ug/dL (ref 42–135)
TIBC: 240 ug/dL (ref 215–435)

## 2011-04-07 LAB — MAGNESIUM: Magnesium: 2.5 mg/dL (ref 1.5–2.5)

## 2011-04-07 LAB — FERRITIN: Ferritin: 261 ng/mL (ref 22–322)

## 2011-04-07 LAB — POCT HEMOGLOBIN-HEMACUE: Hemoglobin: 11 g/dL — ABNORMAL LOW (ref 13.0–17.0)

## 2011-04-15 ENCOUNTER — Other Ambulatory Visit: Payer: Self-pay | Admitting: Nephrology

## 2011-04-15 ENCOUNTER — Encounter (HOSPITAL_COMMUNITY): Payer: BC Managed Care – PPO | Attending: Internal Medicine

## 2011-04-15 DIAGNOSIS — N186 End stage renal disease: Secondary | ICD-10-CM | POA: Insufficient documentation

## 2011-04-15 DIAGNOSIS — D631 Anemia in chronic kidney disease: Secondary | ICD-10-CM | POA: Insufficient documentation

## 2011-04-16 LAB — POCT HEMOGLOBIN-HEMACUE: Hemoglobin: 11 g/dL — ABNORMAL LOW (ref 13.0–17.0)

## 2011-04-22 ENCOUNTER — Other Ambulatory Visit: Payer: Self-pay | Admitting: Nephrology

## 2011-04-22 ENCOUNTER — Encounter (HOSPITAL_COMMUNITY)
Admission: RE | Admit: 2011-04-22 | Discharge: 2011-04-22 | Payer: BC Managed Care – PPO | Source: Ambulatory Visit | Attending: Nephrology | Admitting: Nephrology

## 2011-04-29 ENCOUNTER — Encounter (HOSPITAL_COMMUNITY): Payer: BC Managed Care – PPO

## 2011-04-29 ENCOUNTER — Other Ambulatory Visit: Payer: Self-pay | Admitting: Nephrology

## 2011-04-29 LAB — POCT HEMOGLOBIN-HEMACUE: Hemoglobin: 12.6 g/dL — ABNORMAL LOW (ref 13.0–17.0)

## 2011-05-13 ENCOUNTER — Encounter (HOSPITAL_COMMUNITY)
Admission: RE | Admit: 2011-05-13 | Discharge: 2011-05-13 | Disposition: A | Discharge status: Home / Self Care | Payer: BC Managed Care – PPO | Source: Ambulatory Visit | Attending: Internal Medicine | Admitting: Internal Medicine

## 2011-05-13 ENCOUNTER — Other Ambulatory Visit: Payer: Self-pay | Admitting: Nephrology

## 2011-05-13 DIAGNOSIS — D631 Anemia in chronic kidney disease: Secondary | ICD-10-CM | POA: Insufficient documentation

## 2011-05-13 DIAGNOSIS — N186 End stage renal disease: Secondary | ICD-10-CM | POA: Insufficient documentation

## 2011-05-13 LAB — RENAL FUNCTION PANEL
Albumin: 3.6 g/dL (ref 3.5–5.2)
Chloride: 103 mEq/L (ref 96–112)
GFR calc non Af Amer: 3 mL/min — ABNORMAL LOW (ref >90)
Phosphorus: 6.1 mg/dL — ABNORMAL HIGH (ref 2.3–4.6)
Potassium: 3.7 mEq/L (ref 3.5–5.1)
Sodium: 143 mEq/L (ref 135–145)

## 2011-05-13 LAB — POCT HEMOGLOBIN-HEMACUE: Hemoglobin: 11.7 g/dL — ABNORMAL LOW (ref 13.0–17.0)

## 2011-05-13 LAB — MAGNESIUM: Magnesium: 2.6 mg/dL — ABNORMAL HIGH (ref 1.5–2.5)

## 2011-05-14 LAB — IRON AND TIBC
Iron: 115 ug/dL (ref 42–135)
UIBC: 122 ug/dL — ABNORMAL LOW (ref 125–400)

## 2011-05-14 LAB — FERRITIN: Ferritin: 280 ng/mL (ref 22–322)

## 2011-05-27 ENCOUNTER — Other Ambulatory Visit: Payer: Self-pay | Admitting: Nephrology

## 2011-05-27 ENCOUNTER — Encounter (HOSPITAL_COMMUNITY): Payer: BC Managed Care – PPO

## 2011-06-10 ENCOUNTER — Other Ambulatory Visit: Payer: Self-pay | Admitting: Nephrology

## 2011-06-10 ENCOUNTER — Encounter (HOSPITAL_COMMUNITY)
Admission: RE | Admit: 2011-06-10 | Discharge: 2011-06-10 | Payer: BC Managed Care – PPO | Source: Ambulatory Visit | Attending: Nephrology | Admitting: Nephrology

## 2011-06-10 LAB — CBC
HCT: 29.5 % — ABNORMAL LOW (ref 39.0–52.0)
Hemoglobin: 9.5 g/dL — ABNORMAL LOW (ref 13.0–17.0)
MCHC: 32.2 g/dL (ref 30.0–36.0)

## 2011-06-10 LAB — RENAL FUNCTION PANEL
CO2: 24 mEq/L (ref 19–32)
Calcium: 10 mg/dL (ref 8.4–10.5)
GFR calc Af Amer: 4 mL/min — ABNORMAL LOW (ref >90)
Glucose, Bld: 138 mg/dL — ABNORMAL HIGH (ref 70–99)
Sodium: 144 mEq/L (ref 135–145)

## 2011-06-23 ENCOUNTER — Other Ambulatory Visit (HOSPITAL_COMMUNITY): Payer: Self-pay | Admitting: *Deleted

## 2011-06-25 ENCOUNTER — Encounter (HOSPITAL_COMMUNITY)
Admission: RE | Admit: 2011-06-25 | Discharge: 2011-06-25 | Disposition: A | Discharge status: Home / Self Care | Payer: BC Managed Care – PPO | Source: Ambulatory Visit | Attending: Internal Medicine | Admitting: Internal Medicine

## 2011-06-25 DIAGNOSIS — D631 Anemia in chronic kidney disease: Secondary | ICD-10-CM | POA: Insufficient documentation

## 2011-06-25 DIAGNOSIS — N039 Chronic nephritic syndrome with unspecified morphologic changes: Secondary | ICD-10-CM | POA: Insufficient documentation

## 2011-06-25 DIAGNOSIS — N186 End stage renal disease: Secondary | ICD-10-CM | POA: Insufficient documentation

## 2011-06-25 LAB — POCT HEMOGLOBIN-HEMACUE: Hemoglobin: 9.4 g/dL — ABNORMAL LOW (ref 13.0–17.0)

## 2011-06-25 MED ORDER — EPOETIN ALFA 10000 UNIT/ML IJ SOLN
20000.0000 [IU] | INTRAMUSCULAR | Status: DC
  Administered 2011-06-25: 20000 [IU] via SUBCUTANEOUS

## 2011-06-25 MED ORDER — EPOETIN ALFA 20000 UNIT/ML IJ SOLN
INTRAMUSCULAR | Status: AC
  Filled 2011-06-25: qty 1

## 2011-06-30 ENCOUNTER — Other Ambulatory Visit (HOSPITAL_COMMUNITY): Payer: Self-pay | Admitting: *Deleted

## 2011-07-01 ENCOUNTER — Encounter (HOSPITAL_COMMUNITY): Payer: BC Managed Care – PPO

## 2011-07-02 ENCOUNTER — Encounter (HOSPITAL_COMMUNITY)
Admission: RE | Admit: 2011-07-02 | Discharge: 2011-07-02 | Disposition: A | Discharge status: Home / Self Care | Payer: BC Managed Care – PPO | Source: Ambulatory Visit | Attending: Nephrology | Admitting: Nephrology

## 2011-07-02 MED ORDER — EPOETIN ALFA 20000 UNIT/ML IJ SOLN
INTRAMUSCULAR | Status: AC
  Administered 2011-07-02: 20000 [IU] via SUBCUTANEOUS
  Filled 2011-07-02: qty 1

## 2011-07-02 MED ORDER — EPOETIN ALFA 10000 UNIT/ML IJ SOLN: 20000.0000 [IU] | INTRAMUSCULAR | Status: DC

## 2011-07-09 ENCOUNTER — Encounter (HOSPITAL_COMMUNITY)
Admission: RE | Admit: 2011-07-09 | Discharge: 2011-07-09 | Disposition: A | Discharge status: Home / Self Care | Payer: BC Managed Care – PPO | Source: Ambulatory Visit | Attending: Nephrology | Admitting: Nephrology

## 2011-07-09 LAB — FERRITIN: Ferritin: 250 ng/mL (ref 22–322)

## 2011-07-09 LAB — RENAL FUNCTION PANEL
Albumin: 3.6 g/dL (ref 3.5–5.2)
BUN: 110 mg/dL — ABNORMAL HIGH (ref 6–23)
Phosphorus: 6.4 mg/dL — ABNORMAL HIGH (ref 2.3–4.6)
Potassium: 3.7 mEq/L (ref 3.5–5.1)
Sodium: 143 mEq/L (ref 135–145)

## 2011-07-09 LAB — IRON AND TIBC: Iron: 60 ug/dL (ref 42–135)

## 2011-07-09 LAB — HEPATITIS B SURFACE ANTIGEN: Hepatitis B Surface Ag: NEGATIVE

## 2011-07-09 LAB — CBC
MCV: 84.9 fL (ref 78.0–100.0)
Platelets: 157 10*3/uL (ref 150–400)
RBC: 3.57 MIL/uL — ABNORMAL LOW (ref 4.22–5.81)
WBC: 5.4 10*3/uL (ref 4.0–10.5)

## 2011-07-09 LAB — POCT HEMOGLOBIN-HEMACUE: Hemoglobin: 9.8 g/dL — ABNORMAL LOW (ref 13.0–17.0)

## 2011-07-09 MED ORDER — EPOETIN ALFA 20000 UNIT/ML IJ SOLN
INTRAMUSCULAR | Status: AC
  Administered 2011-07-09: 20000 [IU] via SUBCUTANEOUS
  Filled 2011-07-09: qty 1

## 2011-07-09 MED ORDER — EPOETIN ALFA 10000 UNIT/ML IJ SOLN: 20000.0000 [IU] | INTRAMUSCULAR | Status: DC

## 2011-07-15 ENCOUNTER — Encounter (HOSPITAL_COMMUNITY)
Admission: RE | Admit: 2011-07-15 | Discharge: 2011-07-15 | Disposition: A | Discharge status: Home / Self Care | Payer: BC Managed Care – PPO | Source: Ambulatory Visit | Attending: Internal Medicine | Admitting: Internal Medicine

## 2011-07-15 DIAGNOSIS — N186 End stage renal disease: Secondary | ICD-10-CM | POA: Insufficient documentation

## 2011-07-15 DIAGNOSIS — D631 Anemia in chronic kidney disease: Secondary | ICD-10-CM | POA: Insufficient documentation

## 2011-07-15 DIAGNOSIS — N039 Chronic nephritic syndrome with unspecified morphologic changes: Secondary | ICD-10-CM | POA: Insufficient documentation

## 2011-07-15 LAB — POCT HEMOGLOBIN-HEMACUE: Hemoglobin: 10.1 g/dL — ABNORMAL LOW (ref 13.0–17.0)

## 2011-07-15 MED ORDER — EPOETIN ALFA 10000 UNIT/ML IJ SOLN: 20000.0000 [IU] | INTRAMUSCULAR | Status: DC

## 2011-07-15 MED ORDER — EPOETIN ALFA 20000 UNIT/ML IJ SOLN
INTRAMUSCULAR | Status: AC
  Administered 2011-07-15: 20000 [IU] via SUBCUTANEOUS
  Filled 2011-07-15: qty 1

## 2011-07-22 ENCOUNTER — Encounter (HOSPITAL_COMMUNITY)
Admission: RE | Admit: 2011-07-22 | Discharge: 2011-07-22 | Disposition: A | Discharge status: Home / Self Care | Payer: BC Managed Care – PPO | Source: Ambulatory Visit | Attending: Nephrology | Admitting: Nephrology

## 2011-07-22 LAB — POCT HEMOGLOBIN-HEMACUE: Hemoglobin: 10.4 g/dL — ABNORMAL LOW (ref 13.0–17.0)

## 2011-07-22 MED ORDER — EPOETIN ALFA 10000 UNIT/ML IJ SOLN: 20000.0000 [IU] | INTRAMUSCULAR | Status: DC

## 2011-07-22 MED ORDER — EPOETIN ALFA 20000 UNIT/ML IJ SOLN
INTRAMUSCULAR | Status: AC
  Administered 2011-07-22: 14:00:00 via SUBCUTANEOUS
  Filled 2011-07-22: qty 1

## 2011-07-29 ENCOUNTER — Encounter (HOSPITAL_COMMUNITY)
Admission: RE | Admit: 2011-07-29 | Discharge: 2011-07-29 | Disposition: A | Discharge status: Home / Self Care | Payer: BC Managed Care – PPO | Source: Ambulatory Visit | Attending: Nephrology | Admitting: Nephrology

## 2011-07-29 LAB — POCT HEMOGLOBIN-HEMACUE: Hemoglobin: 11.6 g/dL — ABNORMAL LOW (ref 13.0–17.0)

## 2011-07-29 MED ORDER — EPOETIN ALFA 10000 UNIT/ML IJ SOLN: 20000.0000 [IU] | INTRAMUSCULAR | Status: DC

## 2011-07-30 ENCOUNTER — Other Ambulatory Visit (INDEPENDENT_AMBULATORY_CARE_PROVIDER_SITE_OTHER): Payer: BC Managed Care – PPO

## 2011-07-30 ENCOUNTER — Ambulatory Visit (INDEPENDENT_AMBULATORY_CARE_PROVIDER_SITE_OTHER): Payer: BC Managed Care – PPO | Admitting: Internal Medicine

## 2011-07-30 ENCOUNTER — Encounter: Payer: Self-pay | Admitting: Internal Medicine

## 2011-07-30 VITALS — BP 138/80 | HR 64 | Temp 97.4°F | Resp 16 | Wt 183.0 lb

## 2011-07-30 DIAGNOSIS — E785 Hyperlipidemia, unspecified: Secondary | ICD-10-CM

## 2011-07-30 DIAGNOSIS — N19 Unspecified kidney failure: Secondary | ICD-10-CM

## 2011-07-30 DIAGNOSIS — D649 Anemia, unspecified: Secondary | ICD-10-CM

## 2011-07-30 DIAGNOSIS — I10 Essential (primary) hypertension: Secondary | ICD-10-CM

## 2011-07-30 DIAGNOSIS — R739 Hyperglycemia, unspecified: Secondary | ICD-10-CM

## 2011-07-30 DIAGNOSIS — R7309 Other abnormal glucose: Secondary | ICD-10-CM

## 2011-07-30 DIAGNOSIS — N529 Male erectile dysfunction, unspecified: Secondary | ICD-10-CM

## 2011-07-30 DIAGNOSIS — Z0000 Encounter for general adult medical examination without abnormal findings: Secondary | ICD-10-CM | POA: Insufficient documentation

## 2011-07-30 DIAGNOSIS — K921 Melena: Secondary | ICD-10-CM

## 2011-07-30 LAB — URINALYSIS, ROUTINE W REFLEX MICROSCOPIC
Leukocytes, UA: NEGATIVE
Nitrite: NEGATIVE
Specific Gravity, Urine: 1.02 (ref 1.000–1.030)
pH: 5.5 (ref 5.0–8.0)

## 2011-07-30 LAB — CBC WITH DIFFERENTIAL/PLATELET
Basophils Relative: 0.5 % (ref 0.0–3.0)
Eosinophils Relative: 1.4 % (ref 0.0–5.0)
Lymphocytes Relative: 21.7 % (ref 12.0–46.0)
Neutrophils Relative %: 70.2 % (ref 43.0–77.0)
RBC: 4.13 Mil/uL — ABNORMAL LOW (ref 4.22–5.81)
WBC: 4.1 10*3/uL — ABNORMAL LOW (ref 4.5–10.5)

## 2011-07-30 LAB — VITAMIN B12: Vitamin B-12: 792 pg/mL (ref 211–911)

## 2011-07-30 LAB — FERRITIN: Ferritin: 90.7 ng/mL (ref 22.0–322.0)

## 2011-07-30 MED ORDER — TADALAFIL 5 MG PO TABS: 5.0000 mg | ORAL_TABLET | Freq: Every day | ORAL | Status: AC | PRN

## 2011-07-30 NOTE — Progress Notes (Signed)
Subjective:    Patient ID: [name], male    DOB: 1950/08/22, 60 y.o.   MRN: [id number]  Erectile Dysfunction This is a recurrent problem. The problem has been gradually worsening since onset. The nature of his difficulty is achieving erection, maintaining erection and penetration. Non-physiologic factors contributing to erectile dysfunction are a decreased libido. He reports no anxiety or performance anxiety. He reports his erection duration to be 1 to 5 minutes. Irritative symptoms do not include frequency, nocturia or urgency. Obstructive symptoms include dribbling, a slower stream, straining and a weak stream. Obstructive symptoms do not include incomplete emptying or an intermittent stream. Pertinent negatives include no chills, dysuria, genital pain, hematuria, hesitancy or inability to urinate. Past treatments include nothing.  Anemia Presents for follow-up visit. There has been no abdominal pain, anorexia, bruising/bleeding easily, confusion, fever, leg swelling, light-headedness, malaise/fatigue, pallor, palpitations, paresthesias, pica or weight loss. Signs of blood loss that are not present include hematemesis, hematochezia and melena. There are no compliance problems.       Review of Systems  Constitutional: Negative for fever, chills, weight loss, malaise/fatigue, diaphoresis, activity change, appetite change, fatigue and unexpected weight change.  HENT: Negative.   Eyes: Negative.   Respiratory: Negative for cough, chest tightness, shortness of breath, wheezing and stridor.   Cardiovascular: Negative for chest pain, palpitations and leg swelling.  Gastrointestinal: Positive for constipation. Negative for nausea, vomiting, abdominal pain, diarrhea, blood in stool, melena, hematochezia, abdominal distention, anal bleeding, rectal pain, anorexia and hematemesis.  Genitourinary: Positive for decreased libido. Negative for dysuria, hesitancy, urgency, frequency, hematuria, flank  pain, decreased urine volume, discharge, penile swelling, scrotal swelling, enuresis, difficulty urinating, genital sores, penile pain, testicular pain, incomplete emptying and nocturia.  Musculoskeletal: Negative for myalgias, back pain, joint swelling, arthralgias and gait problem.  Skin: Negative for color change, pallor, rash and wound.  Neurological: Negative for dizziness, tremors, seizures, syncope, facial asymmetry, speech difficulty, weakness, light-headedness, numbness, headaches and paresthesias.  Hematological: Negative for adenopathy. Does not bruise/bleed easily.  Psychiatric/Behavioral: Negative.  Negative for confusion.       Objective:   Physical Exam  Vitals reviewed. Constitutional: He is oriented to person, place, and time. He appears well-developed and well-nourished. No distress.  HENT:  Head: Normocephalic and atraumatic.  Mouth/Throat: Oropharynx is clear and moist. No oropharyngeal exudate.  Eyes: Conjunctivae are normal. Right eye exhibits no discharge. Left eye exhibits no discharge. No scleral icterus.  Neck: Normal range of motion. Neck supple. No JVD present. No tracheal deviation present. No thyromegaly present.  Cardiovascular: Normal rate, regular rhythm, normal heart sounds and intact distal pulses.  Exam reveals no gallop and no friction rub.   No murmur heard. Pulmonary/Chest: Effort normal and breath sounds normal. No stridor. No respiratory distress. He has no wheezes. He has no rales. He exhibits no tenderness.  Abdominal: Soft. Bowel sounds are normal. He exhibits no distension. There is no tenderness. There is no rebound and no guarding. Hernia confirmed negative in the right inguinal area and confirmed negative in the left inguinal area.  Genitourinary: Prostate normal, testes normal and penis normal. Rectal exam shows external hemorrhoid and internal hemorrhoid. Rectal exam shows no fissure, no mass, no tenderness and anal tone normal. Guaiac positive  stool. Prostate is not enlarged and not tender. Right testis shows no mass, no swelling and no tenderness. Right testis is descended. Left testis shows no mass, no swelling and no tenderness. Left testis is descended. Circumcised. No penile tenderness. No discharge  found.  Musculoskeletal: Normal range of motion. He exhibits no edema and no tenderness.  Lymphadenopathy:    He has no cervical adenopathy.       Right: No inguinal adenopathy present.       Left: No inguinal adenopathy present.  Neurological: He is oriented to person, place, and time.  Skin: Skin is warm and dry. No rash noted. He is not diaphoretic. No erythema. No pallor.  Psychiatric: He has a normal mood and affect. His behavior is normal. Judgment and thought content normal.      Lab Results  Component Value Date   WBC 5.4 07/09/2011   HGB 11.6* 07/29/2011   HCT 30.3* 07/09/2011   PLT 157 07/09/2011   GLUCOSE 209* 07/09/2011   CHOL  Value: 214        ATP III CLASSIFICATION:  <200     mg/dL   Desirable  200-239  mg/dL   Borderline High  >=240    mg/dL   High       * 09/08/2010   TRIG 56 09/08/2010   HDL 66 09/08/2010   LDLCALC  Value: 137        Total Cholesterol/HDL:CHD Risk Coronary Heart Disease Risk Table                     Men   Women  1/2 Average Risk   3.4   3.3  Average Risk       5.0   4.4  2 X Average Risk   9.6   7.1  3 X Average Risk  23.4   11.0        Use the calculated Patient Ratio above and the CHD Risk Table to determine the patient's CHD Risk.        ATP III CLASSIFICATION (LDL):  <100     mg/dL   Optimal  100-129  mg/dL   Near or Above                    Optimal  130-159  mg/dL   Borderline  160-189  mg/dL   High  >190     mg/dL   Very High* 09/08/2010   ALT 43 09/09/2010   AST 29 09/09/2010   NA 143 07/09/2011   K 3.7 07/09/2011   CL 98 07/09/2011   CREATININE 14.31* 07/09/2011   BUN 110* 07/09/2011   CO2 29 07/09/2011   TSH 1.775 09/08/2010   HGBA1C  Value: 5.3 (NOTE)                                                                        According to the ADA Clinical Practice Recommendations for 2011, when HbA1c is used as a screening test:   >=6.5%   Diagnostic of Diabetes Mellitus           (if abnormal result  is confirmed)  5.7-6.4%   Increased risk of developing Diabetes Mellitus  References:Diagnosis and Classification of Diabetes Mellitus,Diabetes [id number] 1):S62-S69 and Standards of Medical Care in         Diabetes - 2011,Diabetes [id number]  (Suppl 1):S11-S61. 09/08/2010      Assessment & Plan:

## 2011-07-30 NOTE — Assessment & Plan Note (Signed)
I will recheck his CBC today and will look at his vitamin levels, also he has heme + stool today so I have asked him to see GI to see if there is some blood loss there

## 2011-07-30 NOTE — Assessment & Plan Note (Signed)
Exam done, labs ordered, he refused a flu vaccine, pt ed material was given

## 2011-07-30 NOTE — Assessment & Plan Note (Signed)
I will check his FLP today 

## 2011-07-30 NOTE — Patient Instructions (Signed)
Health Maintenance, Males A healthy lifestyle and preventative care can promote health and wellness.  Maintain regular health, dental, and eye exams.   Eat a healthy diet. Foods like vegetables, fruits, whole grains, low-fat dairy products, and lean protein foods contain the nutrients you need without too many calories. Decrease your intake of foods high in solid fats, added sugars, and salt. Get information about a proper diet from your caregiver, if necessary.   Regular physical exercise is one of the most important things you can do for your health. Most adults should get at least 150 minutes of moderate-intensity exercise (any activity that increases your heart rate and causes you to sweat) each week. In addition, most adults need muscle-strengthening exercises on 2 or more days a week.    Maintain a healthy weight. The body mass index (BMI) is a screening tool to identify possible weight problems. It provides an estimate of body fat based on height and weight. Your caregiver can help determine your BMI, and can help you achieve or maintain a healthy weight. For adults 20 years and older:   A BMI below 18.5 is considered underweight.   A BMI of 18.5 to 24.9 is normal.   A BMI of 25 to 29.9 is considered overweight.   A BMI of 30 and above is considered obese.   Maintain normal blood lipids and cholesterol by exercising and minimizing your intake of saturated fat. Eat a balanced diet with plenty of fruits and vegetables. Blood tests for lipids and cholesterol should begin at age 20 and be repeated every 5 years. If your lipid or cholesterol levels are high, you are over 50, or you are a high risk for heart disease, you may need your cholesterol levels checked more frequently.Ongoing high lipid and cholesterol levels should be treated with medicines, if diet and exercise are not effective.   If you smoke, find out from your caregiver how to quit. If you do not use tobacco, do not start.    If you choose to drink alcohol, do not exceed 2 drinks per day. One drink is considered to be 12 ounces (355 mL) of beer, 5 ounces (148 mL) of wine, or 1.5 ounces (44 mL) of liquor.   Avoid use of street drugs. Do not share needles with anyone. Ask for help if you need support or instructions about stopping the use of drugs.   High blood pressure causes heart disease and increases the risk of stroke. Blood pressure should be checked at least every 1 to 2 years. Ongoing high blood pressure should be treated with medicines if weight loss and exercise are not effective.   If you are 45 to 58 years old, ask your caregiver if you should take aspirin to prevent heart disease.   Diabetes screening involves taking a blood sample to check your fasting blood sugar level. This should be done once every 3 years, after age 45, if you are within normal weight and without risk factors for diabetes. Testing should be considered at a younger age or be carried out more frequently if you are overweight and have at least 1 risk factor for diabetes.   Colorectal cancer can be detected and often prevented. Most routine colorectal cancer screening begins at the age of 50 and continues through age 75. However, your caregiver may recommend screening at an earlier age if you have risk factors for colon cancer. On a yearly basis, your caregiver may provide home test kits to check for hidden   blood in the stool. Use of a small camera at the end of a tube, to directly examine the colon (sigmoidoscopy or colonoscopy), can detect the earliest forms of colorectal cancer. Talk to your caregiver about this at age 50, when routine screening begins. Direct examination of the colon should be repeated every 5 to 10 years through age 75, unless early forms of pre-cancerous polyps or small growths are found.   Healthy men should no longer receive prostate-specific antigen (PSA) blood tests as part of routine cancer screening. Consult with  your caregiver about prostate cancer screening.   Practice safe sex. Use condoms and avoid high-risk sexual practices to reduce the spread of sexually transmitted infections (STIs).   Use sunscreen with a sun protection factor (SPF) of 30 or greater. Apply sunscreen liberally and repeatedly throughout the day. You should seek shade when your shadow is shorter than you. Protect yourself by wearing long sleeves, pants, a wide-brimmed hat, and sunglasses year round, whenever you are outdoors.   Notify your caregiver of new moles or changes in moles, especially if there is a change in shape or color. Also notify your caregiver if a mole is larger than the size of a pencil eraser.   A one-time screening for abdominal aortic aneurysm (AAA) and surgical repair of large AAAs by sound wave imaging (ultrasonography) is recommended for ages 65 to 75 years who are current or former smokers.   Stay current with your immunizations.  Document Released: 01/24/2008 Document Revised: 04/09/2011 Document Reviewed: 12/23/2010 [employer] Patient Information 2012 [employer]. 

## 2011-07-30 NOTE — Assessment & Plan Note (Signed)
GI referral

## 2011-07-30 NOTE — Assessment & Plan Note (Signed)
I will check his labs to look for organic causes, today I do not see any structural problems

## 2011-07-30 NOTE — Assessment & Plan Note (Signed)
His BP is well controlled 

## 2011-07-30 NOTE — Assessment & Plan Note (Signed)
This is followed closely by nephrology

## 2011-07-30 NOTE — Assessment & Plan Note (Signed)
I will check his a1c today 

## 2011-07-31 ENCOUNTER — Encounter: Payer: Self-pay | Admitting: Internal Medicine

## 2011-07-31 ENCOUNTER — Telehealth: Payer: Self-pay | Admitting: Internal Medicine

## 2011-07-31 LAB — IBC PANEL
Iron: 64 ug/dL (ref 42–165)
Saturation Ratios: 26.1 % (ref 20.0–50.0)

## 2011-07-31 NOTE — Telephone Encounter (Signed)
Spoke with Hilda Blades at Dr. Ronnald Ramp office. Patient scheduled on 08/06/11 at 9:15/9:30AM with Dr Olevia Perches.

## 2011-08-01 ENCOUNTER — Encounter: Payer: Self-pay | Admitting: *Deleted

## 2011-08-06 ENCOUNTER — Encounter: Payer: Self-pay | Admitting: Internal Medicine

## 2011-08-06 ENCOUNTER — Encounter (HOSPITAL_COMMUNITY): Payer: BC Managed Care – PPO

## 2011-08-06 ENCOUNTER — Other Ambulatory Visit (HOSPITAL_COMMUNITY): Payer: Self-pay | Admitting: *Deleted

## 2011-08-06 ENCOUNTER — Ambulatory Visit (INDEPENDENT_AMBULATORY_CARE_PROVIDER_SITE_OTHER): Payer: BC Managed Care – PPO | Admitting: Internal Medicine

## 2011-08-06 DIAGNOSIS — R195 Other fecal abnormalities: Secondary | ICD-10-CM

## 2011-08-06 DIAGNOSIS — D509 Iron deficiency anemia, unspecified: Secondary | ICD-10-CM

## 2011-08-06 NOTE — Patient Instructions (Signed)
You have been scheduled for an endoscopy with propofol. Please follow written instructions given to you at your visit today. CC: Dr Scarlette Calico

## 2011-08-06 NOTE — Progress Notes (Signed)
[name] 12-13-1950 MRN [id number]    History of Present Illness:  This is a 60 year old African American male with chronic anemia partially responsive to iron. He receives Procrit injections weekly. His last hemoglobin was 11.6. He was Hemoccult-positive on Dr. Ronnald Ramp exam last week. He denies any visible blood per rectum. He has been constipated. A colonoscopy in August 2012 showed 2 small polyps which were inflammatory. There is no history of colitis, Crohn's disease or peptic ulcer disease. He grew up in [place] to age 60. When he was in college, he developed abdominal pain and was tested with an upper GI series and was treated but he was told that he did not have an ulcer. His weight has been stable. He was hospitalized with acute renal failure in January 2012. He used to take Advil and ibuprofen almost daily but stopped taking it in January 2012 after being told not to use NSAID's.   Past Medical History  Diagnosis Date  . Headache   . Hyperlipidemia   . HTN (hypertension)   . Renal failure   . Anemia   . 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 Gout   . Anemia   . Benign colon polyp 2012   Past Surgical History  Procedure Date  . Av fistula placement, radiocephalic [id number]    Right arm    reports that he has never smoked. He has never used smokeless tobacco. He reports that he drinks alcohol. He reports that he does not use illicit drugs. family history includes Diabetes in his father. Allergies  Allergen Reactions  . Hydrocodone Itching        Review of Systems denies heartburn, dysphagia, odynophagia chest pain or shortness of breath:  The remainder of the 10 point ROS is negative except as outlined in H&P   Physical Exam: General appearance  Well developed, in no distress. Eyes- non icteric. HEENT nontraumatic, normocephalic. Mouth no lesions, tongue papillated, no cheilosis. Neck supple without adenopathy, thyroid not enlarged,  no carotid bruits, no JVD. Lungs Clear to auscultation bilaterally. Cor normal S1, normal S2, regular rhythm, no murmur,  quiet precordium. Abdomen: Soft nontender abdomen with normal active bowel sounds. No distention. No tenderness. Liver edge at costal margin. Rectal: Large amount of foam heart Hemoccult-positive stool Extremities no pedal edema. Skin no lesions. Neurological alert and oriented x 3. Psychological normal mood and affect.  Assessment and Plan:  Problem #1 Chronic iron deficiency anemia and anemia of chronic disease. He is Hemoccult-positive on my exam as well as on Dr. Ronnald Ramp' exam. His colonoscopy is up-to-date. Possibilities for GI blood loss include an upper GI lesion such as peptic ulcer disease or small bowel abnormalities such as AVMs  Of NSAID induced small bowl ulcerations. We will proceed with an upper endoscopy and if negative a small bowel capsule endoscopy. If negative, then I would consider repeating a colonoscopy.   08/06/2011 Delfin Edis

## 2011-08-07 ENCOUNTER — Ambulatory Visit (AMBULATORY_SURGERY_CENTER): Payer: BC Managed Care – PPO | Admitting: Internal Medicine

## 2011-08-07 ENCOUNTER — Encounter: Payer: Self-pay | Admitting: Internal Medicine

## 2011-08-07 VITALS — BP 97/51 | HR 63 | Temp 97.4°F | Resp 20 | Ht 70.0 in | Wt 185.0 lb

## 2011-08-07 DIAGNOSIS — D1330 Benign neoplasm of unspecified part of small intestine: Secondary | ICD-10-CM

## 2011-08-07 DIAGNOSIS — D509 Iron deficiency anemia, unspecified: Secondary | ICD-10-CM

## 2011-08-07 DIAGNOSIS — D649 Anemia, unspecified: Secondary | ICD-10-CM

## 2011-08-07 DIAGNOSIS — K921 Melena: Secondary | ICD-10-CM

## 2011-08-07 MED ORDER — SODIUM CHLORIDE 0.9 % IV SOLN: 500.0000 mL | INTRAVENOUS | Status: DC

## 2011-08-07 NOTE — Patient Instructions (Signed)
FOLLOW DISCHARGE INSTRUCTIONS ([place]).  AWAIT BIOPSY RESULTS  CAPSULE ENDOSCOPY TEACHING SCHEDULED FOR 08-22-11  2:00 PM   CAPSULE ENDOSCOPY 1 14-13  8:00

## 2011-08-07 NOTE — Progress Notes (Signed)
Patient did not experience any of the following events: a burn prior to discharge; a fall within the facility; wrong site/side/patient/procedure/implant event; or a hospital transfer or hospital admission upon discharge from the facility. (G8907) Patient did not have preoperative order for IV antibiotic SSI prophylaxis. (G8918)  

## 2011-08-07 NOTE — Op Note (Signed)
[facility]. [place], [place]  [id number]  ENDOSCOPY PROCEDURE REPORT  PATIENT:  Greg, White  MR#:  [id number] BIRTHDATE:  Jan 24, 1951, 60 yrs. old  GENDER:  male  ENDOSCOPIST:  Lowella Bandy. Olevia Perches, MD Referred by:  Janith Lima, M.D.  PROCEDURE DATE:  08/07/2011 PROCEDURE:  EGD with biopsy, 43239 ASA CLASS:  Class II INDICATIONS:  iron deficiency anemia, hemoccult positive stool renal insifficiency, receiving Procrit weekly, multifactorial anemia had colon polyps 2011,  MEDICATIONS:   MAC sedation, administered by CRNA, propofol (Diprivan) 150 mg TOPICAL ANESTHETIC:  none  DESCRIPTION OF PROCEDURE:   After the risks benefits and alternatives of the procedure were thoroughly explained, informed consent was obtained.  The LB [id number]-H180 [id number] endoscope was introduced through the mouth and advanced to the second portion of the duodenum, without limitations.  The instrument was slowly withdrawn as the mucosa was fully examined. <<PROCEDUREIMAGES>>  The upper, middle, and distal third of the esophagus were carefully inspected and no abnormalities were noted. The z-line was well seen at the GEJ. The endoscope was pushed into the fundus which was normal including a retroflexed view. The antrum,gastric body, first and second part of the duodenum were unremarkable.  A biopsy for H. pylori was taken (see image1, image2, image3, image4, image5, and image6). Bx small bowl    Retroflexed views revealed no abnormalities.    The scope was then withdrawn from the patient and the procedure completed.  COMPLICATIONS:  None  ENDOSCOPIC IMPRESSION: 1) Normal EGD s/p small bowl biopsies to r/o villous atrophy,gastric Bx to r/o H.Pylori nothing to account for heme positive stool RECOMMENDATIONS: 1) Await biopsy results small bowl capsule endoscopy to look for avm's  REPEAT EXAM:  In 0 year(s) for.  ______________________________ Lowella Bandy. Olevia Perches,  MD  CC:  n. eSIGNED:   Lowella Bandy. Brodie at 08/07/2011 04:50 PM  Armanda Heritage, [id number]

## 2011-08-08 ENCOUNTER — Telehealth: Payer: Self-pay

## 2011-08-08 NOTE — Telephone Encounter (Signed)

## 2011-08-13 ENCOUNTER — Encounter: Payer: Self-pay | Admitting: Internal Medicine

## 2011-08-13 ENCOUNTER — Other Ambulatory Visit: Payer: Self-pay

## 2011-08-13 ENCOUNTER — Telehealth: Payer: Self-pay | Admitting: Internal Medicine

## 2011-08-13 ENCOUNTER — Encounter (HOSPITAL_COMMUNITY)
Admission: RE | Admit: 2011-08-13 | Discharge: 2011-08-13 | Disposition: A | Discharge status: Home / Self Care | Payer: BC Managed Care – PPO | Source: Ambulatory Visit | Attending: Internal Medicine | Admitting: Internal Medicine

## 2011-08-13 DIAGNOSIS — D631 Anemia in chronic kidney disease: Secondary | ICD-10-CM | POA: Insufficient documentation

## 2011-08-13 DIAGNOSIS — D509 Iron deficiency anemia, unspecified: Secondary | ICD-10-CM

## 2011-08-13 DIAGNOSIS — N186 End stage renal disease: Secondary | ICD-10-CM | POA: Insufficient documentation

## 2011-08-13 DIAGNOSIS — D649 Anemia, unspecified: Secondary | ICD-10-CM

## 2011-08-13 DIAGNOSIS — N039 Chronic nephritic syndrome with unspecified morphologic changes: Secondary | ICD-10-CM | POA: Insufficient documentation

## 2011-08-13 LAB — RENAL FUNCTION PANEL
Albumin: 3.4 g/dL — ABNORMAL LOW (ref 3.5–5.2)
BUN: 112 mg/dL — ABNORMAL HIGH (ref 6–23)
Chloride: 99 mEq/L (ref 96–112)
GFR calc non Af Amer: 3 mL/min — ABNORMAL LOW (ref >90)
Potassium: 3.9 mEq/L (ref 3.5–5.1)

## 2011-08-13 LAB — IRON AND TIBC
Iron: 82 ug/dL (ref 42–135)
TIBC: 208 ug/dL — ABNORMAL LOW (ref 215–435)

## 2011-08-13 MED ORDER — METRONIDAZOLE 250 MG PO TABS: ORAL_TABLET | ORAL | Status: DC

## 2011-08-13 MED ORDER — EPOETIN ALFA 10000 UNIT/ML IJ SOLN: 20000.0000 [IU] | INTRAMUSCULAR | Status: DC

## 2011-08-13 MED ORDER — AMOXICILLIN 500 MG PO CAPS: ORAL_CAPSULE | ORAL | Status: DC

## 2011-08-13 MED ORDER — OMEPRAZOLE 40 MG PO CPDR: DELAYED_RELEASE_CAPSULE | ORAL | Status: DC

## 2011-08-13 NOTE — Telephone Encounter (Signed)
Message copied by Lafayette Dragon on Wed Aug 13, 2011  4:15 PM ------      Message from: Fletcher Anon      Created: Wed Aug 13, 2011  1:22 PM       Dr Olevia Perches, Look under the lab tab and it is resulted under todays date. 08-13-11

## 2011-08-13 NOTE — Telephone Encounter (Signed)
thanx DB

## 2011-08-13 NOTE — Telephone Encounter (Signed)
Please send Bioxin 500mg  po bid, x 10 days, Amoxacillin 1000 mg po bid x 1days, PPI bid

## 2011-08-13 NOTE — Telephone Encounter (Signed)
I have spoken to patient to advise him that his test came back positive for H Pylori. I have confirmed that he is not allergic to any medications other than hydrocodone. I have also confirmed that he is not on colchicine for gout (which can interact with H pylori medications). I have explained to the patient that we typically treat H Pylori effectively with a 10 day course of antibiotics. Patient verbalizes understanding and asks that we send a copy of our note to his nephrologist. I have spoken to Dr Olevia Perches and she has decided to send Flagyl 250 four times daily x 10 days in place of Biaxin due to possible drug interaction between biaxin and procardia.

## 2011-08-13 NOTE — Progress Notes (Signed)
Please let me know where to find the results of this test. Thanx DB

## 2011-08-19 ENCOUNTER — Encounter (HOSPITAL_COMMUNITY): Payer: BC Managed Care – PPO

## 2011-08-21 ENCOUNTER — Telehealth: Payer: Self-pay | Admitting: Internal Medicine

## 2011-08-21 NOTE — Telephone Encounter (Signed)
Rescheduled Capsule endo teaching to 08/29/11 at 2:00 PM and Capsule endo procedure on 09/02/11 at 8:00 AM.

## 2011-08-26 ENCOUNTER — Encounter (HOSPITAL_COMMUNITY)
Admission: RE | Admit: 2011-08-26 | Discharge: 2011-08-26 | Disposition: A | Discharge status: Home / Self Care | Payer: BC Managed Care – PPO | Source: Ambulatory Visit | Attending: Nephrology | Admitting: Nephrology

## 2011-08-26 LAB — POCT HEMOGLOBIN-HEMACUE: Hemoglobin: 9.8 g/dL — ABNORMAL LOW (ref 13.0–17.0)

## 2011-08-26 MED ORDER — EPOETIN ALFA 20000 UNIT/ML IJ SOLN
INTRAMUSCULAR | Status: AC
  Administered 2011-08-26: 20000 [IU] via SUBCUTANEOUS
  Filled 2011-08-26: qty 1

## 2011-08-26 MED ORDER — EPOETIN ALFA 10000 UNIT/ML IJ SOLN: 20000.0000 [IU] | INTRAMUSCULAR | Status: DC

## 2011-09-02 ENCOUNTER — Ambulatory Visit (INDEPENDENT_AMBULATORY_CARE_PROVIDER_SITE_OTHER): Payer: BC Managed Care – PPO | Admitting: Internal Medicine

## 2011-09-02 ENCOUNTER — Encounter (HOSPITAL_COMMUNITY)
Admission: RE | Admit: 2011-09-02 | Discharge: 2011-09-02 | Disposition: A | Discharge status: Home / Self Care | Payer: BC Managed Care – PPO | Source: Ambulatory Visit | Attending: Nephrology | Admitting: Nephrology

## 2011-09-02 DIAGNOSIS — D500 Iron deficiency anemia secondary to blood loss (chronic): Secondary | ICD-10-CM

## 2011-09-02 MED ORDER — EPOETIN ALFA 10000 UNIT/ML IJ SOLN: 20000.0000 [IU] | INTRAMUSCULAR | Status: DC

## 2011-09-02 MED ORDER — EPOETIN ALFA 20000 UNIT/ML IJ SOLN
INTRAMUSCULAR | Status: AC
  Administered 2011-09-02: 20000 [IU] via SUBCUTANEOUS
  Filled 2011-09-02: qty 1

## 2011-09-02 NOTE — Progress Notes (Signed)
Patient here for capsule endo today.  Patient tolerated the procedure well.  Lot # [id number] exp 09/2012

## 2011-09-08 ENCOUNTER — Encounter: Payer: Self-pay | Admitting: Internal Medicine

## 2011-09-09 ENCOUNTER — Encounter (HOSPITAL_COMMUNITY)
Admission: RE | Admit: 2011-09-09 | Discharge: 2011-09-09 | Disposition: A | Discharge status: Home / Self Care | Payer: BC Managed Care – PPO | Source: Ambulatory Visit | Attending: Nephrology | Admitting: Nephrology

## 2011-09-09 LAB — RENAL FUNCTION PANEL
CO2: 26 mEq/L (ref 19–32)
Calcium: 10.7 mg/dL — ABNORMAL HIGH (ref 8.4–10.5)
Chloride: 100 mEq/L (ref 96–112)
GFR calc Af Amer: 4 mL/min — ABNORMAL LOW (ref >90)
GFR calc non Af Amer: 3 mL/min — ABNORMAL LOW (ref >90)
Glucose, Bld: 116 mg/dL — ABNORMAL HIGH (ref 70–99)
Sodium: 143 mEq/L (ref 135–145)

## 2011-09-09 LAB — IRON AND TIBC
Iron: 47 ug/dL (ref 42–135)
TIBC: 200 ug/dL — ABNORMAL LOW (ref 215–435)

## 2011-09-09 LAB — FERRITIN: Ferritin: 146 ng/mL (ref 22–322)

## 2011-09-09 LAB — POCT HEMOGLOBIN-HEMACUE: Hemoglobin: 11.2 g/dL — ABNORMAL LOW (ref 13.0–17.0)

## 2011-09-09 MED ORDER — EPOETIN ALFA 20000 UNIT/ML IJ SOLN
INTRAMUSCULAR | Status: AC
  Administered 2011-09-09: 20000 [IU] via SUBCUTANEOUS
  Filled 2011-09-09: qty 1

## 2011-09-09 MED ORDER — EPOETIN ALFA 10000 UNIT/ML IJ SOLN: 20000.0000 [IU] | INTRAMUSCULAR | Status: DC

## 2011-09-12 ENCOUNTER — Telehealth: Payer: Self-pay | Admitting: Internal Medicine

## 2011-09-12 NOTE — Telephone Encounter (Signed)
I have informed the patient of results of the small bowel capsule endoscopy. which showed a bleeding lesion within 19 minutes of the capsule ingestion. I recommended enteroscopy to localize  the lesion. He agrees with the plan for enteroscopy. Will set it up for Tue or Thurs

## 2011-09-12 NOTE — Telephone Encounter (Signed)
Patient has been scheduled for a enteroscopy as per Dr Nichola Sizer recommendation. Patient has been advised of time an date of enteroscopy and previsit time and verbalizes understanding.

## 2011-09-15 ENCOUNTER — Other Ambulatory Visit (HOSPITAL_COMMUNITY): Payer: Self-pay | Admitting: *Deleted

## 2011-09-16 ENCOUNTER — Encounter (HOSPITAL_COMMUNITY)
Admission: RE | Admit: 2011-09-16 | Discharge: 2011-09-16 | Disposition: A | Discharge status: Home / Self Care | Payer: BC Managed Care – PPO | Source: Ambulatory Visit | Attending: Internal Medicine | Admitting: Internal Medicine

## 2011-09-16 DIAGNOSIS — N039 Chronic nephritic syndrome with unspecified morphologic changes: Secondary | ICD-10-CM | POA: Insufficient documentation

## 2011-09-16 DIAGNOSIS — N186 End stage renal disease: Secondary | ICD-10-CM | POA: Insufficient documentation

## 2011-09-16 DIAGNOSIS — D631 Anemia in chronic kidney disease: Secondary | ICD-10-CM | POA: Insufficient documentation

## 2011-09-16 LAB — RENAL FUNCTION PANEL
Albumin: 3.5 g/dL (ref 3.5–5.2)
BUN: 96 mg/dL — ABNORMAL HIGH (ref 6–23)
Calcium: 9.9 mg/dL (ref 8.4–10.5)
Chloride: 104 mEq/L (ref 96–112)
Creatinine, Ser: 12.93 mg/dL — ABNORMAL HIGH (ref 0.50–1.35)
GFR calc non Af Amer: 4 mL/min — ABNORMAL LOW (ref >90)
Phosphorus: 6.1 mg/dL — ABNORMAL HIGH (ref 2.3–4.6)

## 2011-09-16 LAB — URIC ACID: Uric Acid, Serum: 10.1 mg/dL — ABNORMAL HIGH (ref 4.0–7.8)

## 2011-09-16 MED ORDER — EPOETIN ALFA 10000 UNIT/ML IJ SOLN: 20000.0000 [IU] | INTRAMUSCULAR | Status: DC

## 2011-09-16 MED ORDER — EPOETIN ALFA 20000 UNIT/ML IJ SOLN
INTRAMUSCULAR | Status: AC
  Administered 2011-09-16: 20000 [IU] via SUBCUTANEOUS
  Filled 2011-09-16: qty 1

## 2011-09-17 LAB — VITAMIN D 25 HYDROXY (VIT D DEFICIENCY, FRACTURES): Vit D, 25-Hydroxy: 17 ng/mL — ABNORMAL LOW (ref 30–89)

## 2011-09-19 ENCOUNTER — Ambulatory Visit (AMBULATORY_SURGERY_CENTER): Payer: BC Managed Care – PPO | Admitting: *Deleted

## 2011-09-19 VITALS — Ht 71.0 in | Wt 178.0 lb

## 2011-09-19 DIAGNOSIS — K639 Disease of intestine, unspecified: Secondary | ICD-10-CM

## 2011-09-19 DIAGNOSIS — K6389 Other specified diseases of intestine: Secondary | ICD-10-CM

## 2011-09-22 ENCOUNTER — Encounter: Payer: Self-pay | Admitting: Internal Medicine

## 2011-09-23 ENCOUNTER — Encounter (HOSPITAL_COMMUNITY)
Admission: RE | Admit: 2011-09-23 | Discharge: 2011-09-23 | Disposition: A | Discharge status: Home / Self Care | Payer: BC Managed Care – PPO | Source: Ambulatory Visit | Attending: Nephrology | Admitting: Nephrology

## 2011-09-23 LAB — POCT HEMOGLOBIN-HEMACUE: Hemoglobin: 12.2 g/dL — ABNORMAL LOW (ref 13.0–17.0)

## 2011-09-23 MED ORDER — EPOETIN ALFA 10000 UNIT/ML IJ SOLN: 20000.0000 [IU] | INTRAMUSCULAR | Status: DC

## 2011-09-30 ENCOUNTER — Encounter (HOSPITAL_COMMUNITY): Payer: Self-pay | Admitting: *Deleted

## 2011-09-30 ENCOUNTER — Encounter: Payer: Self-pay | Admitting: Internal Medicine

## 2011-09-30 ENCOUNTER — Ambulatory Visit (AMBULATORY_SURGERY_CENTER): Payer: BC Managed Care – PPO | Admitting: Internal Medicine

## 2011-09-30 ENCOUNTER — Other Ambulatory Visit: Payer: Self-pay

## 2011-09-30 ENCOUNTER — Emergency Department (HOSPITAL_COMMUNITY)
Admission: EM | Admit: 2011-09-30 | Discharge: 2011-09-30 | Disposition: A | Discharge status: Home / Self Care | Payer: BC Managed Care – PPO | Attending: Emergency Medicine | Admitting: Emergency Medicine

## 2011-09-30 VITALS — BP 204/108 | HR 66 | Temp 95.9°F | Resp 14 | Ht 71.0 in | Wt 178.0 lb

## 2011-09-30 DIAGNOSIS — K6389 Other specified diseases of intestine: Secondary | ICD-10-CM

## 2011-09-30 DIAGNOSIS — K2970 Gastritis, unspecified, without bleeding: Secondary | ICD-10-CM

## 2011-09-30 DIAGNOSIS — I10 Essential (primary) hypertension: Secondary | ICD-10-CM

## 2011-09-30 DIAGNOSIS — K921 Melena: Secondary | ICD-10-CM

## 2011-09-30 DIAGNOSIS — Z79899 Other long term (current) drug therapy: Secondary | ICD-10-CM | POA: Insufficient documentation

## 2011-09-30 DIAGNOSIS — I129 Hypertensive chronic kidney disease with stage 1 through stage 4 chronic kidney disease, or unspecified chronic kidney disease: Secondary | ICD-10-CM | POA: Insufficient documentation

## 2011-09-30 DIAGNOSIS — E785 Hyperlipidemia, unspecified: Secondary | ICD-10-CM | POA: Insufficient documentation

## 2011-09-30 DIAGNOSIS — K2990 Gastroduodenitis, unspecified, without bleeding: Secondary | ICD-10-CM

## 2011-09-30 DIAGNOSIS — Z9119 Patient's noncompliance with other medical treatment and regimen: Secondary | ICD-10-CM | POA: Insufficient documentation

## 2011-09-30 DIAGNOSIS — D649 Anemia, unspecified: Secondary | ICD-10-CM

## 2011-09-30 DIAGNOSIS — Z91199 Patient's noncompliance with other medical treatment and regimen due to unspecified reason: Secondary | ICD-10-CM | POA: Insufficient documentation

## 2011-09-30 DIAGNOSIS — N189 Chronic kidney disease, unspecified: Secondary | ICD-10-CM | POA: Insufficient documentation

## 2011-09-30 LAB — URINE MICROSCOPIC-ADD ON

## 2011-09-30 LAB — POCT I-STAT, CHEM 8
BUN: 96 mg/dL — ABNORMAL HIGH (ref 6–23)
Calcium, Ion: 1.07 mmol/L — ABNORMAL LOW (ref 1.12–1.32)
Chloride: 112 mEq/L (ref 96–112)
Glucose, Bld: 73 mg/dL (ref 70–99)
HCT: 37 % — ABNORMAL LOW (ref 39.0–52.0)
Potassium: 4.1 mEq/L (ref 3.5–5.1)

## 2011-09-30 LAB — URINALYSIS, ROUTINE W REFLEX MICROSCOPIC
Bilirubin Urine: NEGATIVE
Glucose, UA: NEGATIVE mg/dL
Ketones, ur: NEGATIVE mg/dL
Nitrite: NEGATIVE
Specific Gravity, Urine: 1.012 (ref 1.005–1.030)
pH: 6.5 (ref 5.0–8.0)

## 2011-09-30 MED ORDER — CARVEDILOL 12.5 MG PO TABS
12.5000 mg | ORAL_TABLET | Freq: Two times a day (BID) | ORAL | Status: DC
  Administered 2011-09-30: 12.5 mg via ORAL
  Filled 2011-09-30 (×4): qty 1

## 2011-09-30 MED ORDER — SODIUM CHLORIDE 0.9 % IV SOLN: 500.0000 mL | INTRAVENOUS | Status: DC

## 2011-09-30 MED ORDER — HYDRALAZINE HCL 25 MG PO TABS
25.0000 mg | ORAL_TABLET | Freq: Once | ORAL | Status: AC
  Administered 2011-09-30: 25 mg via ORAL
  Filled 2011-09-30: qty 1

## 2011-09-30 MED ORDER — NIFEDIPINE ER 60 MG PO TB24
60.0000 mg | ORAL_TABLET | Freq: Once | ORAL | Status: AC
  Administered 2011-09-30: 60 mg via ORAL
  Filled 2011-09-30: qty 1

## 2011-09-30 NOTE — ED Notes (Signed)
Pt states he was sent over from his pcp office. Pt states he went for endoscopy and now is hypertensive after procedure. Pt denies any headaches/blurred vision.

## 2011-09-30 NOTE — Progress Notes (Signed)
Patient had elevated blood pressure as he was admitted to Recovery Room. Pressures continued to rise. Patient did not take his blood pressure medications for the last two days. Patient states that this has happened to him before when skipping his meds. Last time, systolic pressures were in the 300s and once he resumed his medications, they steadily returned to normal. Per Dr. Olevia Perches, Patient will should go to ER to be evaluated for hypertension. IV site hep lock. Copies of report sent with wife.     Patient did not have preoperative order for IV antibiotic SSI prophylaxis. [phone])  Patient not to have experienced a burn prior to discharge. [phone]) Patient experienced a hospital transfer or hospital admission upon discharge from [facility]. [phone]) Patient not to have experienced a fall within [facility]. [phone]) Patient did not experience a wrong site, wrong side, wrong patient, wrong procedure, or wrong implant event. [phone])

## 2011-09-30 NOTE — ED Notes (Signed)
Pt states he did not take his bp medication today or yesterday. Pt came with an iv in he lfa from [facility]

## 2011-09-30 NOTE — Discharge Instructions (Signed)
Follow up with your primary care doctor to have your blood pressure and urine rechecked.  Take all of your medications as prescribed to prevent heart attack, stroke and blindness.

## 2011-09-30 NOTE — ED Notes (Signed)
[id number] Expected date:09/30/11<BR> Expected time: 4:55 PM<BR> Means of arrival:Other<BR> Comments:<BR> Hold for ENDO-elevated BP has received labetalol 60mg 

## 2011-09-30 NOTE — Patient Instructions (Signed)
YOU HAD AN ENDOSCOPIC PROCEDURE TODAY AT THE [facility]: Refer to the procedure report that was given to you for any specific questions about what was found during the examination.  If the procedure report does not answer your questions, please call your gastroenterologist to clarify.  If you requested that your care partner not be given the details of your procedure findings, then the procedure report has been included in a sealed envelope for you to review at your convenience later.  YOU SHOULD EXPECT: Some feelings of bloating in the abdomen. Passage of more gas than usual.  Walking can help get rid of the air that was put into your GI tract during the procedure and reduce the bloating. If you had a lower endoscopy (such as a colonoscopy or flexible sigmoidoscopy) you may notice spotting of blood in your stool or on the toilet paper. If you underwent a bowel prep for your procedure, then you may not have a normal bowel movement for a few days.  DIET: Your first meal following the procedure should be a light meal and then it is ok to progress to your normal diet.  A half-sandwich or bowl of soup is an example of a good first meal.  Heavy or fried foods are harder to digest and may make you feel nauseous or bloated.  Likewise meals heavy in dairy and vegetables can cause extra gas to form and this can also increase the bloating.  Drink plenty of fluids but you should avoid alcoholic beverages for 24 hours.  ACTIVITY: Your care partner should take you home directly after the procedure.  You should plan to take it easy, moving slowly for the rest of the day.  You can resume normal activity the day after the procedure however you should NOT DRIVE or use heavy machinery for 24 hours (because of the sedation medicines used during the test).    SYMPTOMS TO REPORT IMMEDIATELY: A gastroenterologist can be reached at any hour.  During normal business hours, 8:30 AM to 5:00 PM Monday through Friday,  call [phone].  After hours and on weekends, please call the GI answering service at [phone] who will take a message and have the physician on call contact you.  Following upper endoscopy (EGD)  Vomiting of blood or coffee ground material  New chest pain or pain under the shoulder blades  Painful or persistently difficult swallowing  New shortness of breath  Fever of 100F or higher  Black, tarry-looking stools  FOLLOW UP: If any biopsies were taken you will be contacted by phone or by letter within the next 1-3 weeks.  Call your gastroenterologist if you have not heard about the biopsies in 3 weeks.  Our staff will call the home number listed on your records the next business day following your procedure to check on you and address any questions or concerns that you may have at that time regarding the information given to you following your procedure. This is a courtesy call and so if there is no answer at the home number and we have not heard from you through the emergency physician on call, we will assume that you have returned to your regular daily activities without incident.  SIGNATURES/CONFIDENTIALITY: You and/or your care partner have signed paperwork which will be entered into your electronic medical record.  These signatures attest to the fact that that the information above on your After Visit Summary has been reviewed and is understood.  Full responsibility of   the confidentiality of this discharge information lies with you and/or your care-partner. 

## 2011-09-30 NOTE — ED Notes (Signed)
Ordered meds from pharmacy and pt notified of this that he needed this med prior to d/c

## 2011-09-30 NOTE — ED Notes (Signed)
correg arrived and went to give it to the patient and d/c him and found the room empty.  patient left w/o d/c orders

## 2011-09-30 NOTE — ED Provider Notes (Signed)
History     CSN: [id number]  Arrival date & time 09/30/11  1731   First MD Initiated Contact with Patient 09/30/11 1737      Chief Complaint  Patient presents with  . Hypertension    (Consider location/radiation/quality/duration/timing/severity/associated sxs/prior treatment) HPI History provided by pt.   Pt had an endoscopy at [facility] today.  Was referred to ED afterwards d/t elevated BP.  Triage BP 227/108.  Pt takes nifedipine, carvedilol and hydralazine for HTN.  Has not taken these medications in the past two days because they keep him awake at night and he has intermittent pruritis as well.  He denies headache, blurred vision, chest pain, dyspnea.  Has had increased urinary frequency as well as nocturia.  Seen by his PCP for nocturia in 07/2011 and had a normal prostate at that time.   Otherwise feeling well.    Past Medical History  Diagnosis Date  . Headache   . Hyperlipidemia   . HTN (hypertension)   . Renal failure   . Anemia   . 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 Gout   . Anemia   . Benign colon polyp 2012  . Clot     STATED TOLD IN [facility] CLOT TO LEFT SHOULDER. DOCTOR MADE AWARE    Past Surgical History  Procedure Date  . Av fistula placement, radiocephalic 123XX123    Right arm    Family History  Problem Relation Age of Onset  . Diabetes Father   . Colon cancer Neg Hx   . Esophageal cancer Neg Hx   . Stomach cancer Neg Hx   . Rectal cancer Neg Hx     History  Substance Use Topics  . Smoking status: Never Smoker   . Smokeless tobacco: Never Used  . Alcohol Use: No     1 drink a month or non      Review of Systems  All other systems reviewed and are negative.    Allergies  Hydrocodone  Home Medications   Current Outpatient Rx  Name Route Sig Dispense Refill  . ASPIRIN 81 MG PO TBEC Oral Take 81 mg by mouth daily.      Marland Kitchen CALCITRIOL 0.5 MCG PO CAPS Oral Take 0.5 mcg by mouth daily.      Marland Kitchen CARVEDILOL 12.5  MG PO TABS Oral Take 12.5 mg by mouth 2 (two) times daily.      . FUROSEMIDE 40 MG PO TABS      . HYDRALAZINE HCL 25 MG PO TABS Oral Take 25 mg by mouth 3 (three) times daily.      Marland Kitchen NIFEDIPINE 60 MG (OSM) PO TB24 Oral Take 60 mg by mouth daily.        BP 227/108  Pulse 65  Temp(Src) 97.8 F (36.6 C) (Oral)  Resp 20  Ht 5\' 11"  (1.803 m)  Wt 178 lb (80.74 kg)  BMI 24.83 kg/m2  SpO2 99%  Physical Exam  Nursing note and vitals reviewed. Constitutional: He is oriented to person, place, and time. He appears well-developed and well-nourished. No distress.  HENT:  Head: Normocephalic and atraumatic.  Eyes:       Normal appearance  Neck: Normal range of motion.  Cardiovascular: Normal rate and regular rhythm.        hypertensive  Pulmonary/Chest: Effort normal and breath sounds normal.  Musculoskeletal: Normal range of motion.  Neurological: He is alert and oriented to person, place, and time. He has  normal reflexes. No cranial nerve deficit or sensory deficit. Coordination normal.       5/5 and equal upper and lower extremity strength.  No past pointing.     Skin: Skin is warm and dry. No rash noted.  Psychiatric: He has a normal mood and affect. His behavior is normal.    ED Course  Procedures (including critical care time)  Labs Reviewed  URINALYSIS, ROUTINE W REFLEX MICROSCOPIC - Abnormal; Notable for the following:    Hgb urine dipstick LARGE (*)    Protein, ur >300 (*)    All other components within normal limits  POCT I-STAT, CHEM 8 - Abnormal; Notable for the following:    BUN 96 (*)    Creatinine, Ser 11.10 (*)    Calcium, Ion 1.07 (*)    Hemoglobin 12.6 (*)    HCT 37.0 (*)    All other components within normal limits  URINE MICROSCOPIC-ADD ON - Abnormal; Notable for the following:    Squamous Epithelial / LPF FEW (*)    Bacteria, UA FEW (*)    All other components within normal limits  LAB REPORT - SCANNED   No results found.   1. Hypertension        MDM  61yo M referred to ED by GI physician for hypertension.  Non-compliant w/ meds.  Currently asymptomatic and only complaint is increased urinary frequency and nocturia (prostate recently checked by PCP).  No acute findings on exam w/ exception of BP of 227/108.  U/A and Cr pending.  Patient's home meds have been ordered for him to take in ED.    U/A neg and Cr stable.  I advised compliance with medications and explained to pt that these three drugs are unlikely to be the cause of his insomnia.  Recommended that he contact his PCP tomorrow to schedule f/u.  Discharged home.          Remer Macho, PA 10/01/11 [place], [place] 10/01/11 [phone]

## 2011-09-30 NOTE — Op Note (Signed)
[facility] [place], [place]  [id number]  ENDOSCOPY PROCEDURE REPORT  PATIENT:  [name], [name]  MR#:  [id number] BIRTHDATE:  Jan 04, 1951, 60 yrs. old  GENDER:  male  ENDOSCOPIST:  Lowella Bandy. Olevia Perches, MD Referred by:  Janith Lima, M.D.  PROCEDURE DATE:  09/30/2011 PROCEDURE:  enteroscopy 44360 ASA CLASS:  Class II INDICATIONS:  anemia, hemoccult positive stool positive SBCE for lesion in jejunum 19 min distally, CRI, hypertension, taking ASA  MEDICATIONS:   MAC sedation, administered by CRNA, propofol (Diprivan) 300 mg TOPICAL ANESTHETIC:  none  DESCRIPTION OF PROCEDURE:   After the risks benefits and alternatives of the procedure were thoroughly explained, informed consent was obtained.  The LB PCF-H180AL [id number] endoscope was introduced through the mouth and advanced to the proximal jejunum, without limitations.  The instrument was slowly withdrawn as the mucosa was fully examined. <<PROCEDUREIMAGES>>  Severe gastritis was found. erosive gastritis with bleeding erosions With standard forceps, a biopsy was obtained and sent to pathology (see image2, image3, and image9).  Duodenitis was found in the duodenal bulb portion of the duodenum (see image7, image6, and image8). erosive duodenitis  Otherwise the examination was normal (see image11, image10, image5, image4, and image1). normal jejunum, no lesion to 160cm    Retroflexed views revealed no abnormalities.    The scope was then withdrawn from the patient and the procedure completed.  COMPLICATIONS:  None  ENDOSCOPIC IMPRESSION: 1) Severe gastritis 2) Duodenitis in the duodenal bulb duodenum 3) Otherwise normal examination bleeding from stomach and duodenum, normal jejunum to 160 cm RECOMMENDATIONS: 1) Await biopsy results send pt to [facility] ED for control of his hypertension, which has not responed to IV Labetolol  REPEAT EXAM:  In 0 year(s) for.  ______________________________ Lowella Bandy. Olevia Perches,  MD  CC:  n. eSIGNED:   Lowella Bandy. Khylon Davies at 09/30/2011 04:50 PM  Armanda Heritage, [id number]

## 2011-10-01 ENCOUNTER — Telehealth: Payer: Self-pay

## 2011-10-01 NOTE — ED Provider Notes (Signed)
Medical screening examination/treatment/procedure(s) were performed by non-physician practitioner and as supervising physician I was immediately available for consultation/collaboration.  Leota Jacobsen, MD 10/01/11 [phone]

## 2011-10-01 NOTE — Telephone Encounter (Signed)
Left message

## 2011-10-06 ENCOUNTER — Encounter: Payer: Self-pay | Admitting: Internal Medicine

## 2011-10-07 ENCOUNTER — Encounter (HOSPITAL_COMMUNITY)
Admission: RE | Admit: 2011-10-07 | Discharge: 2011-10-07 | Disposition: A | Discharge status: Home / Self Care | Payer: BC Managed Care – PPO | Source: Ambulatory Visit | Attending: Nephrology | Admitting: Nephrology

## 2011-10-07 LAB — IRON AND TIBC
Iron: 113 ug/dL (ref 42–135)
TIBC: 217 ug/dL (ref 215–435)
UIBC: 104 ug/dL — ABNORMAL LOW (ref 125–400)

## 2011-10-07 MED ORDER — EPOETIN ALFA 10000 UNIT/ML IJ SOLN: 20000.0000 [IU] | INTRAMUSCULAR | Status: DC

## 2011-10-07 MED ORDER — EPOETIN ALFA 20000 UNIT/ML IJ SOLN
INTRAMUSCULAR | Status: AC
  Administered 2011-10-07: 15:00:00 via SUBCUTANEOUS
  Filled 2011-10-07: qty 1

## 2011-10-13 ENCOUNTER — Other Ambulatory Visit (HOSPITAL_COMMUNITY): Payer: Self-pay | Admitting: *Deleted

## 2011-10-14 ENCOUNTER — Encounter (HOSPITAL_COMMUNITY)
Admission: RE | Admit: 2011-10-14 | Discharge: 2011-10-14 | Disposition: A | Discharge status: Home / Self Care | Payer: BC Managed Care – PPO | Source: Ambulatory Visit | Attending: Internal Medicine | Admitting: Internal Medicine

## 2011-10-14 DIAGNOSIS — N186 End stage renal disease: Secondary | ICD-10-CM | POA: Insufficient documentation

## 2011-10-14 DIAGNOSIS — D631 Anemia in chronic kidney disease: Secondary | ICD-10-CM | POA: Insufficient documentation

## 2011-10-14 LAB — RENAL FUNCTION PANEL
CO2: 22 mEq/L (ref 19–32)
Chloride: 101 mEq/L (ref 96–112)
GFR calc Af Amer: 4 mL/min — ABNORMAL LOW (ref >90)
Glucose, Bld: 181 mg/dL — ABNORMAL HIGH (ref 70–99)
Phosphorus: 7.1 mg/dL — ABNORMAL HIGH (ref 2.3–4.6)
Potassium: 3.8 mEq/L (ref 3.5–5.1)
Sodium: 140 mEq/L (ref 135–145)

## 2011-10-14 LAB — POCT HEMOGLOBIN-HEMACUE: Hemoglobin: 10.3 g/dL — ABNORMAL LOW (ref 13.0–17.0)

## 2011-10-14 LAB — MAGNESIUM: Magnesium: 2.7 mg/dL — ABNORMAL HIGH (ref 1.5–2.5)

## 2011-10-14 MED ORDER — EPOETIN ALFA 10000 UNIT/ML IJ SOLN: 20000.0000 [IU] | INTRAMUSCULAR | Status: DC

## 2011-10-14 MED ORDER — EPOETIN ALFA 20000 UNIT/ML IJ SOLN
INTRAMUSCULAR | Status: AC
  Administered 2011-10-14: 20000 [IU] via SUBCUTANEOUS
  Filled 2011-10-14: qty 1

## 2011-10-20 ENCOUNTER — Other Ambulatory Visit (HOSPITAL_COMMUNITY): Payer: Self-pay | Admitting: *Deleted

## 2011-10-21 ENCOUNTER — Encounter (HOSPITAL_COMMUNITY)
Admission: RE | Admit: 2011-10-21 | Discharge: 2011-10-21 | Disposition: A | Discharge status: Home / Self Care | Payer: BC Managed Care – PPO | Source: Ambulatory Visit | Attending: Nephrology | Admitting: Nephrology

## 2011-10-21 LAB — POCT HEMOGLOBIN-HEMACUE: Hemoglobin: 10.5 g/dL — ABNORMAL LOW (ref 13.0–17.0)

## 2011-10-21 MED ORDER — EPOETIN ALFA 10000 UNIT/ML IJ SOLN: 20000.0000 [IU] | INTRAMUSCULAR | Status: DC

## 2011-10-21 MED ORDER — EPOETIN ALFA 20000 UNIT/ML IJ SOLN
INTRAMUSCULAR | Status: AC
  Administered 2011-10-21: 20000 [IU] via SUBCUTANEOUS
  Filled 2011-10-21: qty 1

## 2011-10-27 ENCOUNTER — Other Ambulatory Visit (HOSPITAL_COMMUNITY): Payer: Self-pay | Admitting: *Deleted

## 2011-10-28 ENCOUNTER — Encounter (HOSPITAL_COMMUNITY)
Admission: RE | Admit: 2011-10-28 | Discharge: 2011-10-28 | Disposition: A | Discharge status: Home / Self Care | Payer: BC Managed Care – PPO | Source: Ambulatory Visit | Attending: Nephrology | Admitting: Nephrology

## 2011-10-28 LAB — HEPATITIS B SURFACE ANTIGEN: Hepatitis B Surface Ag: NEGATIVE

## 2011-10-28 MED ORDER — EPOETIN ALFA 10000 UNIT/ML IJ SOLN: 20000.0000 [IU] | INTRAMUSCULAR | Status: DC

## 2011-11-03 ENCOUNTER — Other Ambulatory Visit (HOSPITAL_COMMUNITY): Payer: Self-pay | Admitting: *Deleted

## 2011-11-04 ENCOUNTER — Encounter (HOSPITAL_COMMUNITY): Admission: RE | Admit: 2011-11-04 | Payer: BC Managed Care – PPO | Source: Ambulatory Visit

## 2011-11-11 ENCOUNTER — Other Ambulatory Visit (HOSPITAL_COMMUNITY): Payer: Self-pay | Admitting: *Deleted

## 2011-11-11 ENCOUNTER — Encounter (HOSPITAL_COMMUNITY): Admission: RE | Admit: 2011-11-11 | Payer: BC Managed Care – PPO | Source: Ambulatory Visit

## 2011-11-18 ENCOUNTER — Encounter (HOSPITAL_COMMUNITY)
Admission: RE | Admit: 2011-11-18 | Discharge: 2011-11-18 | Disposition: A | Discharge status: Home / Self Care | Payer: BC Managed Care – PPO | Source: Ambulatory Visit | Attending: Internal Medicine | Admitting: Internal Medicine

## 2011-11-18 DIAGNOSIS — D631 Anemia in chronic kidney disease: Secondary | ICD-10-CM | POA: Insufficient documentation

## 2011-11-18 DIAGNOSIS — N186 End stage renal disease: Secondary | ICD-10-CM | POA: Insufficient documentation

## 2011-11-18 LAB — MAGNESIUM: Magnesium: 2.4 mg/dL (ref 1.5–2.5)

## 2011-11-18 LAB — RENAL FUNCTION PANEL
BUN: 126 mg/dL — ABNORMAL HIGH (ref 6–23)
Chloride: 98 mEq/L (ref 96–112)
Creatinine, Ser: 15.54 mg/dL — ABNORMAL HIGH (ref 0.50–1.35)
Glucose, Bld: 131 mg/dL — ABNORMAL HIGH (ref 70–99)
Phosphorus: 9.6 mg/dL — ABNORMAL HIGH (ref 2.3–4.6)
Potassium: 3.6 mEq/L (ref 3.5–5.1)

## 2011-11-18 LAB — IRON AND TIBC
Iron: 86 ug/dL (ref 42–135)
TIBC: 196 ug/dL — ABNORMAL LOW (ref 215–435)
UIBC: 110 ug/dL — ABNORMAL LOW (ref 125–400)

## 2011-11-18 LAB — FERRITIN: Ferritin: 389 ng/mL — ABNORMAL HIGH (ref 22–322)

## 2011-11-18 MED ORDER — EPOETIN ALFA 10000 UNIT/ML IJ SOLN: 20000.0000 [IU] | INTRAMUSCULAR | Status: DC

## 2011-11-18 MED ORDER — EPOETIN ALFA 20000 UNIT/ML IJ SOLN
INTRAMUSCULAR | Status: AC
  Administered 2011-11-18: 20000 [IU] via SUBCUTANEOUS
  Filled 2011-11-18: qty 1

## 2011-11-24 ENCOUNTER — Other Ambulatory Visit: Payer: Self-pay | Admitting: Internal Medicine

## 2011-11-25 ENCOUNTER — Encounter (HOSPITAL_COMMUNITY)
Admission: RE | Admit: 2011-11-25 | Discharge: 2011-11-25 | Disposition: A | Discharge status: Home / Self Care | Payer: BC Managed Care – PPO | Source: Ambulatory Visit | Attending: Nephrology | Admitting: Nephrology

## 2011-11-25 MED ORDER — EPOETIN ALFA 10000 UNIT/ML IJ SOLN: 20000.0000 [IU] | INTRAMUSCULAR | Status: DC

## 2011-11-25 MED ORDER — EPOETIN ALFA 20000 UNIT/ML IJ SOLN
INTRAMUSCULAR | Status: AC
  Administered 2011-11-25: 20000 [IU] via SUBCUTANEOUS
  Filled 2011-11-25: qty 1

## 2011-12-01 ENCOUNTER — Other Ambulatory Visit (HOSPITAL_COMMUNITY): Payer: Self-pay | Admitting: *Deleted

## 2011-12-02 ENCOUNTER — Encounter (HOSPITAL_COMMUNITY)
Admission: RE | Admit: 2011-12-02 | Discharge: 2011-12-02 | Disposition: A | Discharge status: Home / Self Care | Payer: BC Managed Care – PPO | Source: Ambulatory Visit | Attending: Nephrology | Admitting: Nephrology

## 2011-12-02 MED ORDER — EPOETIN ALFA 20000 UNIT/ML IJ SOLN
INTRAMUSCULAR | Status: AC
  Filled 2011-12-02: qty 1

## 2011-12-02 MED ORDER — EPOETIN ALFA 10000 UNIT/ML IJ SOLN
30000.0000 [IU] | INTRAMUSCULAR | Status: DC
  Administered 2011-12-02: 30000 [IU] via SUBCUTANEOUS

## 2011-12-02 MED ORDER — EPOETIN ALFA 10000 UNIT/ML IJ SOLN
INTRAMUSCULAR | Status: AC
  Administered 2011-12-02: 30000 [IU] via SUBCUTANEOUS
  Filled 2011-12-02: qty 1

## 2011-12-03 LAB — POCT HEMOGLOBIN-HEMACUE: Hemoglobin: 9.5 g/dL — ABNORMAL LOW (ref 13.0–17.0)

## 2011-12-09 ENCOUNTER — Encounter (HOSPITAL_COMMUNITY)
Admission: RE | Admit: 2011-12-09 | Discharge: 2011-12-09 | Disposition: A | Discharge status: Home / Self Care | Payer: BC Managed Care – PPO | Source: Ambulatory Visit | Attending: Nephrology | Admitting: Nephrology

## 2011-12-09 LAB — POCT HEMOGLOBIN-HEMACUE: Hemoglobin: 10.3 g/dL — ABNORMAL LOW (ref 13.0–17.0)

## 2011-12-09 MED ORDER — EPOETIN ALFA 10000 UNIT/ML IJ SOLN
INTRAMUSCULAR | Status: AC
  Administered 2011-12-09: 10000 [IU] via SUBCUTANEOUS
  Filled 2011-12-09: qty 1

## 2011-12-09 MED ORDER — EPOETIN ALFA 10000 UNIT/ML IJ SOLN: 30000.0000 [IU] | INTRAMUSCULAR | Status: DC

## 2011-12-09 MED ORDER — EPOETIN ALFA 20000 UNIT/ML IJ SOLN
INTRAMUSCULAR | Status: AC
  Administered 2011-12-09: 20000 [IU] via SUBCUTANEOUS
  Filled 2011-12-09: qty 1

## 2011-12-15 ENCOUNTER — Other Ambulatory Visit (HOSPITAL_COMMUNITY): Payer: Self-pay | Admitting: *Deleted

## 2011-12-16 ENCOUNTER — Encounter (HOSPITAL_COMMUNITY)
Admission: RE | Admit: 2011-12-16 | Discharge: 2011-12-16 | Disposition: A | Discharge status: Home / Self Care | Payer: BC Managed Care – PPO | Source: Ambulatory Visit | Attending: Internal Medicine | Admitting: Internal Medicine

## 2011-12-16 DIAGNOSIS — N186 End stage renal disease: Secondary | ICD-10-CM | POA: Insufficient documentation

## 2011-12-16 DIAGNOSIS — D631 Anemia in chronic kidney disease: Secondary | ICD-10-CM | POA: Insufficient documentation

## 2011-12-16 DIAGNOSIS — N039 Chronic nephritic syndrome with unspecified morphologic changes: Secondary | ICD-10-CM | POA: Insufficient documentation

## 2011-12-16 LAB — RENAL FUNCTION PANEL
Albumin: 3.6 g/dL (ref 3.5–5.2)
Calcium: 8.8 mg/dL (ref 8.4–10.5)
GFR calc Af Amer: 3 mL/min — ABNORMAL LOW (ref >90)
GFR calc non Af Amer: 3 mL/min — ABNORMAL LOW (ref >90)
Phosphorus: 9.8 mg/dL — ABNORMAL HIGH (ref 2.3–4.6)
Potassium: 4.3 mEq/L (ref 3.5–5.1)
Sodium: 139 mEq/L (ref 135–145)

## 2011-12-16 LAB — HEPATITIS B SURFACE ANTIGEN: Hepatitis B Surface Ag: NEGATIVE

## 2011-12-16 LAB — HEMOGLOBIN AND HEMATOCRIT, BLOOD
HCT: 36.6 % — ABNORMAL LOW (ref 39.0–52.0)
Hemoglobin: 11.5 g/dL — ABNORMAL LOW (ref 13.0–17.0)

## 2011-12-16 LAB — IRON AND TIBC
Iron: 49 ug/dL (ref 42–135)
UIBC: 197 ug/dL (ref 125–400)

## 2011-12-16 MED ORDER — EPOETIN ALFA 10000 UNIT/ML IJ SOLN: 30000.0000 [IU] | INTRAMUSCULAR | Status: DC

## 2011-12-17 LAB — VITAMIN D 25 HYDROXY (VIT D DEFICIENCY, FRACTURES): Vit D, 25-Hydroxy: 10 ng/mL — ABNORMAL LOW (ref 30–89)

## 2011-12-23 ENCOUNTER — Encounter (HOSPITAL_COMMUNITY)
Admission: RE | Admit: 2011-12-23 | Discharge: 2011-12-23 | Disposition: A | Discharge status: Home / Self Care | Payer: BC Managed Care – PPO | Source: Ambulatory Visit | Attending: Nephrology | Admitting: Nephrology

## 2011-12-23 MED ORDER — EPOETIN ALFA 10000 UNIT/ML IJ SOLN
INTRAMUSCULAR | Status: AC
  Filled 2011-12-23: qty 1

## 2011-12-23 MED ORDER — EPOETIN ALFA 20000 UNIT/ML IJ SOLN
INTRAMUSCULAR | Status: AC
  Filled 2011-12-23: qty 1

## 2011-12-23 MED ORDER — EPOETIN ALFA 10000 UNIT/ML IJ SOLN
30000.0000 [IU] | INTRAMUSCULAR | Status: DC
  Administered 2011-12-23: 30000 [IU] via SUBCUTANEOUS

## 2011-12-30 ENCOUNTER — Inpatient Hospital Stay (HOSPITAL_COMMUNITY): Admission: RE | Admit: 2011-12-30 | Payer: BC Managed Care – PPO | Source: Ambulatory Visit

## 2012-01-06 ENCOUNTER — Other Ambulatory Visit (HOSPITAL_COMMUNITY): Payer: Self-pay | Admitting: Nephrology

## 2012-01-06 DIAGNOSIS — N186 End stage renal disease: Secondary | ICD-10-CM

## 2012-01-08 ENCOUNTER — Ambulatory Visit (HOSPITAL_COMMUNITY)
Admission: RE | Admit: 2012-01-08 | Discharge: 2012-01-08 | Disposition: A | Discharge status: Home / Self Care | Payer: BC Managed Care – PPO | Source: Ambulatory Visit | Attending: Nephrology | Admitting: Nephrology

## 2012-01-08 ENCOUNTER — Other Ambulatory Visit: Payer: Self-pay

## 2012-01-08 ENCOUNTER — Encounter (HOSPITAL_COMMUNITY): Payer: Self-pay | Admitting: Surgery

## 2012-01-08 ENCOUNTER — Other Ambulatory Visit (HOSPITAL_COMMUNITY): Payer: Self-pay | Admitting: Nephrology

## 2012-01-08 ENCOUNTER — Encounter (HOSPITAL_COMMUNITY): Payer: Self-pay

## 2012-01-08 VITALS — BP 141/84 | HR 68 | Temp 98.2°F | Resp 20

## 2012-01-08 DIAGNOSIS — Z992 Dependence on renal dialysis: Secondary | ICD-10-CM | POA: Insufficient documentation

## 2012-01-08 DIAGNOSIS — E785 Hyperlipidemia, unspecified: Secondary | ICD-10-CM | POA: Insufficient documentation

## 2012-01-08 DIAGNOSIS — Y849 Medical procedure, unspecified as the cause of abnormal reaction of the patient, or of later complication, without mention of misadventure at the time of the procedure: Secondary | ICD-10-CM | POA: Insufficient documentation

## 2012-01-08 DIAGNOSIS — N186 End stage renal disease: Secondary | ICD-10-CM | POA: Insufficient documentation

## 2012-01-08 DIAGNOSIS — I120 Hypertensive chronic kidney disease with stage 5 chronic kidney disease or end stage renal disease: Secondary | ICD-10-CM | POA: Insufficient documentation

## 2012-01-08 DIAGNOSIS — T82898A Other specified complication of vascular prosthetic devices, implants and grafts, initial encounter: Secondary | ICD-10-CM | POA: Insufficient documentation

## 2012-01-08 LAB — POTASSIUM: Potassium: 3.8 mEq/L (ref 3.5–5.1)

## 2012-01-08 MED ORDER — FENTANYL CITRATE 0.05 MG/ML IJ SOLN
INTRAMUSCULAR | Status: AC | PRN
  Administered 2012-01-08 (×2): 50 ug via INTRAVENOUS

## 2012-01-08 MED ORDER — ALTEPLASE 2 MG IJ SOLR
2.0000 mg | Freq: Once | INTRAMUSCULAR | Status: DC
  Filled 2012-01-08: qty 2

## 2012-01-08 MED ORDER — IOHEXOL 300 MG/ML  SOLN
100.0000 mL | Freq: Once | INTRAMUSCULAR | Status: AC | PRN
  Administered 2012-01-08: 40 mL via INTRAVENOUS

## 2012-01-08 MED ORDER — HEPARIN SODIUM (PORCINE) 1000 UNIT/ML IJ SOLN
INTRAMUSCULAR | Status: AC | PRN
  Administered 2012-01-08: 3000 [IU] via INTRAVENOUS

## 2012-01-08 MED ORDER — MIDAZOLAM HCL 5 MG/5ML IJ SOLN
INTRAMUSCULAR | Status: AC | PRN
  Administered 2012-01-08: 1 mg via INTRAVENOUS
  Administered 2012-01-08 (×4): 0.5 mg via INTRAVENOUS

## 2012-01-08 MED ORDER — CEFAZOLIN SODIUM-DEXTROSE 2-3 GM-% IV SOLR: 2.0000 g | Freq: Once | INTRAVENOUS | Status: DC

## 2012-01-08 MED ORDER — HEPARIN SODIUM (PORCINE) 1000 UNIT/ML IJ SOLN
INTRAMUSCULAR | Status: AC
  Filled 2012-01-08: qty 1

## 2012-01-08 MED ORDER — ALTEPLASE 100 MG IV SOLR
INTRAVENOUS | Status: AC | PRN
  Administered 2012-01-08: 2 mg

## 2012-01-08 MED ORDER — SODIUM CHLORIDE 0.9 % IV SOLN: INTRAVENOUS | Status: DC

## 2012-01-08 MED ORDER — MIDAZOLAM HCL 2 MG/2ML IJ SOLN
INTRAMUSCULAR | Status: AC
  Filled 2012-01-08: qty 6

## 2012-01-08 MED ORDER — FENTANYL CITRATE 0.05 MG/ML IJ SOLN
INTRAMUSCULAR | Status: AC
  Filled 2012-01-08: qty 4

## 2012-01-08 MED ORDER — SODIUM CHLORIDE 0.9 % IV SOLN
INTRAVENOUS | Status: AC | PRN
  Administered 2012-01-08: 50 mL/h via INTRAVENOUS

## 2012-01-08 NOTE — ED Notes (Signed)
[name] - PA notified of Potassium results 3.8

## 2012-01-08 NOTE — H&P (Signed)
Chief Complaint: Clotted RUE AVF Referring Physician:Patel HPI: [name] is an 61 y.o. male with ESRD who had a RUE forearm AVF created about a year ago but just recently started using it. He had 3 treatments of HD and now it is clotted. He is scheduled for declot procedure, which he has never had.  Past Medical History:  Past Medical History  Diagnosis Date  . Headache   . Hyperlipidemia   . HTN (hypertension)   . Renal failure   . Anemia   . 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 Gout   . Anemia   . Benign colon polyp 2012  . Clot     STATED TOLD IN [facility] CLOT TO LEFT SHOULDER. DOCTOR MADE AWARE    Past Surgical History:  Past Surgical History  Procedure Date  . Av fistula placement, radiocephalic 123XX123    Right arm    Family History:  Family History  Problem Relation Age of Onset  . Diabetes Father   . Colon cancer Neg Hx   . Esophageal cancer Neg Hx   . Stomach cancer Neg Hx   . Rectal cancer Neg Hx     Social History:  reports that he has never smoked. He has never used smokeless tobacco. He reports that he does not drink alcohol or use illicit drugs.  Allergies:  Allergies  Allergen Reactions  . Hydrocodone Itching    Medications: .  ASPIRIN 81 MG PO TBEC  Oral  Take 81 mg by mouth daily.  Marland Kitchen  CALCITRIOL 0.5 MCG PO CAPS  Oral  Take 0.5 mcg by mouth daily.  Marland Kitchen  CARVEDILOL 12.5 MG PO TABS  Oral  Take 12.5 mg by mouth 2 (two) times daily.  .  FUROSEMIDE 40 MG PO TABS  .  HYDRALAZINE HCL 25 MG PO TABS  Oral  Take 25 mg by mouth 3 (three) times daily.  Marland Kitchen  NIFEDIPINE 60 MG (OSM) PO TB24  Oral  Take 60 mg by mouth daily.    Please HPI for pertinent positives, otherwise complete 10 system ROS negative.  Physical Exam: Blood pressure 147/85, pulse 71, temperature 98.2 F (36.8 C), temperature source Oral, resp. rate 16, SpO2 96.00%. There is no height or weight on file to calculate BMI.   General  Appearance:  Alert, cooperative, no distress, appears stated age  Head:  Normocephalic, without obvious abnormality, atraumatic  ENT: Unremarkable  Neck: Supple, symmetrical, trachea midline, no adenopathy, thyroid: not enlarged, symmetric, no tenderness/mass/nodules  Lungs:   Clear to auscultation bilaterally, no w/r/r, respirations unlabored without use of accessory muscles.  Chest Wall:  No tenderness or deformity  Heart:  Regular rate and rhythm, S1, S2 normal, no murmur, rub or gallop. Carotids 2+ without bruit.  Abdomen:   Soft, non-tender, non distended. Bowel sounds active all four quadrants,  no masses, no organomegaly.  Extremities: Palpable (R)forearm AVF with distal pulse only but otherwise firm and without bruit c/w thrombsosis  Pulses: 2+ and symmetric, radial  Skin: Skin color, texture, turgor normal, no rashes or lesions  Neurologic: Normal affect, no gross deficits.   Results for orders placed during the hospital encounter of 01/08/12 (from the past 48 hour(s))  POTASSIUM     Status: Normal   Collection Time   01/08/12  7:40 AM      Component Value Range Comment   Potassium 3.8  3.5 - 5.1 (mEq/L)    No results found.  Assessment/Plan: ESRD Thrombosed (R)forearm AVF Proceed with thrombolysis,thrombectomy, possible angioplasty, possible temp cath if necessary. Pt understands all involved risks and complications. Consent signed in chart.  Ascencion Dike PA-C 01/08/2012, 8:27 AM

## 2012-01-08 NOTE — Discharge Instructions (Signed)
Moderate Sedation, Adult Moderate sedation is given to help you relax or even sleep through a procedure. You may remain sleepy, be clumsy, or have poor balance for several hours following this procedure. Arrange for a responsible adult, family member, or friend to take you home. A responsible adult should stay with you for at least 24 hours or until the medicines have worn off.  Do not participate in any activities where you could become injured for the next 24 hours, or until you feel normal again. Do not:   Drive.   Swim.   Ride a bicycle.   Operate heavy machinery.   Cook.   Use power tools.   Climb ladders.   Work at heights.   Do not make important decisions or sign legal documents until you are improved.   Vomiting may occur if you eat too soon. When you can drink without vomiting, try water, juice, or soup. Try solid foods if you feel little or no nausea.   Only take over-the-counter or prescription medications for pain, discomfort, or fever as directed by your caregiver.If pain medications have been prescribed for you, ask your caregiver how soon it is safe to take them.   Make sure you and your family fully understands everything about the medication given to you. Make sure you understand what side effects may occur.   You should not drink alcohol, take sleeping pills, or medications that cause drowsiness for at least 24 hours.   If you smoke, do not smoke alone.   If you are feeling better, you may resume normal activities 24 hours after receiving sedation.   Keep all appointments as scheduled. Follow all instructions.   Ask questions if you do not understand.  SEEK MEDICAL CARE IF:   Your skin is pale or bluish in color.   You continue to feel sick to your stomach (nauseous) or throw up (vomit).   Your pain is getting worse and not helped by medication.   You have bleeding or swelling.   You are still sleepy or feeling clumsy after 24 hours.  SEEK IMMEDIATE  MEDICAL CARE IF:   You develop a rash.   You have difficulty breathing.   You develop any type of allergic problem.   You have a fever.  Document Released: 04/22/2001 Document Revised: 07/17/2011 Document Reviewed: 09/13/2007 Exit[employer] Patient Information 2012 [employer]. 

## 2012-01-08 NOTE — Pre-Procedure Instructions (Signed)
[place]  01/08/2012   Your procedure is scheduled on:  Friday, 01/09/2012 @1512PM .  Report to [facility] at 8:30 AM.  Call this number if you have problems the morning of surgery: [phone]   Remember:   Do not eat food:After Midnight.  May have clear liquids: up to 4 Hours before arrival(nothing after  4:30AM).  Clear liquids include soda, tea, black coffee, apple or grape juice, broth.  Take these medicines the morning of surgery with A SIP OF WATER: Apresoline, Procardia XL, Carvedilol   Do not wear jewelry, make-up or nail polish.  Do not wear lotions, powders, or perfumes. You may wear deodorant.  Do not shave 48 hours prior to surgery. Men may shave face and neck.  Do not bring valuables to the hospital.  Contacts, dentures or bridgework may not be worn into surgery.  Leave suitcase in the car. After surgery it may be brought to your room.  For patients admitted to the hospital, checkout time is 11:00 AM the day of discharge.   Patients discharged the day of surgery will not be allowed to drive home.  Name and phone number of your driver: Wife, Cordelia Parks(#[phone]).  Special Instructions: CHG Shower Use Special Wash: 1/2 bottle night before surgery and 1/2 bottle morning of surgery.   Please read over the following fact sheets that you were given: Pain Booklet, Coughing and Deep Breathing and Surgical Site Infection Prevention

## 2012-01-08 NOTE — Progress Notes (Addendum)
Called pt to obtain PAT hx for surgery scheduled for 01/09/2012.Pt denies having sleep apnea/sleep study.  Reports having stress test, heart studies,etc at [facility] a request for the proceeding info along w/ recent OV notes.  Pt reports PCP: Dr. Ronnald Ramp w/[facility].  Instructed pt on preop instructions.  Pt verbalized back understanding.//L. Naksh Radi,RN

## 2012-01-08 NOTE — Procedures (Signed)
Declot R FA fistula, 71mm venous PTA No complication No blood loss. See complete dictation in [facility].

## 2012-01-09 ENCOUNTER — Telehealth (HOSPITAL_COMMUNITY): Payer: Self-pay | Admitting: *Deleted

## 2012-01-09 ENCOUNTER — Ambulatory Visit (HOSPITAL_COMMUNITY)
Admission: RE | Admit: 2012-01-09 | Payer: BC Managed Care – PPO | Source: Ambulatory Visit | Admitting: Vascular Surgery

## 2012-01-09 ENCOUNTER — Encounter (HOSPITAL_COMMUNITY): Admission: RE | Payer: Self-pay | Source: Ambulatory Visit

## 2012-01-09 HISTORY — DX: Nausea with vomiting, unspecified: R11.2

## 2012-01-09 HISTORY — DX: Other specified postprocedural states: Z98.890

## 2012-01-09 SURGERY — INSERTION OF DIALYSIS CATHETER
Anesthesia: Monitor Anesthesia Care | Site: Arm Lower | Laterality: Right

## 2012-01-09 NOTE — Progress Notes (Signed)
Patient called registration and left message stating that he was going to cancel surgery today. Called and spoke with patient and requested that patient the he contact Dr. Nicole Cella office patient states he contacted [facility] who set this appointment up. Notified Dr. Scot Dock of same.

## 2012-01-09 NOTE — Telephone Encounter (Signed)
Attempted post procedure follow up call.  No answer, went to VM, pt's box full.  Was unable to leave a message

## 2012-01-09 NOTE — Telephone Encounter (Signed)
Pt returned call after receiving VM from earlier today.  Says very unhappy with service rec'd in department.  Says graft has reclotted and feels MD didn't explain procedure or risks to him as should have been done.  Also feels he did not receive adequate pain control or was his follow up care explained as he felt it should have been.  Apologized to patient, attempted to acquire more detailed information r/t complaints and issues to forward to management for follow up

## 2012-01-12 ENCOUNTER — Telehealth (HOSPITAL_COMMUNITY): Payer: Self-pay

## 2012-01-12 ENCOUNTER — Telehealth (HOSPITAL_COMMUNITY): Payer: Self-pay | Admitting: *Deleted

## 2012-01-12 NOTE — Telephone Encounter (Signed)
Radiology post procedure call. Patient very unhappy with the surgical management of his fistula. States he is seeking care elsewhere. Not taking dialysis currently. Encouraged to speak to his kidney doctor re his options.

## 2012-01-14 ENCOUNTER — Telehealth: Payer: Self-pay | Admitting: Vascular Surgery

## 2012-01-14 NOTE — Telephone Encounter (Signed)
Patient walked in on 01/14/12 @ 3:40 stating that Dr Jimmy Footman and Alinda Sierras at [facility] said he had an appointment with [facility] today at 345. We did not have a record of patient being on the schedule. I spoke with Alinda Sierras @ [facility] and she stated that Dr Deterding had her call the patient and tell him he had an appointment on 01/14/12 at 345pm.  I spoke with Dr Scot Dock about the situation and Dr Scot Dock offered to see him, but wanted to make sure the patient knew that he was an hour behind and he was being worked in. I offered to get the patient in today, with the above disclaimer, but the patient opted to see [facility] next week on 01/21/12 @ 930am.  I spoke with Alinda Sierras again to notify her of the new appointment and apparently, Linda @ [facility] and Arbie Cookey here at our office had some sort of mix up in communication and therefore the patient did not get worked in. Arbie Cookey was under the impression that Vaughan Basta would call back on 01/13/12 to schedule patient if he needed to be seen.  However, whatever the case, the patient was offered an appointment today, but opted to r/s for next week. dpm

## 2012-01-16 ENCOUNTER — Encounter: Payer: Self-pay | Admitting: Vascular Surgery

## 2012-01-16 ENCOUNTER — Encounter (HOSPITAL_COMMUNITY): Payer: Self-pay | Admitting: *Deleted

## 2012-01-16 ENCOUNTER — Ambulatory Visit (INDEPENDENT_AMBULATORY_CARE_PROVIDER_SITE_OTHER): Payer: BC Managed Care – PPO | Admitting: Vascular Surgery

## 2012-01-16 ENCOUNTER — Other Ambulatory Visit: Payer: Self-pay | Admitting: *Deleted

## 2012-01-16 ENCOUNTER — Encounter: Payer: Self-pay | Admitting: *Deleted

## 2012-01-16 ENCOUNTER — Encounter (HOSPITAL_COMMUNITY): Payer: Self-pay | Admitting: Pharmacy Technician

## 2012-01-16 VITALS — BP 140/76 | HR 92 | Resp 18 | Ht 70.5 in | Wt 173.0 lb

## 2012-01-16 DIAGNOSIS — N186 End stage renal disease: Secondary | ICD-10-CM

## 2012-01-16 MED ORDER — DEXTROSE 5 % IV SOLN
1.5000 g | INTRAVENOUS | Status: DC
  Filled 2012-01-16: qty 1.5

## 2012-01-16 NOTE — Progress Notes (Signed)
[facility]  Established Dialysis Access  History of Present Illness  [name] is a 61 y.o. (Feb 06, 1951) male who presents for re-evaluation for permanent access.  The patient is right hand dominant.  Previous access procedures have been completed in both arms.  The patient's complication from previous access procedures include: thrombosis.  The patient has never had a previous PPM placed.  The right RC AVF recently underwent perc. Thrombectomy but recently clotted again.  Past Medical History, Past Surgical History, Social History, Family History, Medications, Allergies, and Review of Systems are unchanged from previous visit on 12/31/10.  Physical Examination  Filed Vitals:   01/16/12 0946  BP: 140/76  Pulse: 92  Resp: 18  Height: 5' 10.5" (1.791 m)  Weight: 173 lb (78.472 kg)   Body mass index is 24.47 kg/(m^2).  General: A&O x 3, WDWN  Pulmonary: Sym exp, good air movt, CTAB, no rales, rhonchi, & wheezing  Cardiac: RRR, Nl S1, S2, no Murmurs, rubs or gallops  Gastrointestinal: soft, NTND, -G/R, - HSM, - masses, - CVAT B  Musculoskeletal: M/S 5/5 throughout , Extremities without  ischemic changes , thrombosed R RC AVF  Neurologic: CN 2-12 intact , Pain and light touch intact in extremities , Motor exam as listed above  Medical Decision Making  [name] is a 61 y.o. male who presents with ESRD requiring hemodialysis.   Based on vein mapping and examination, this patient's permanent access options include: possible R BC AVF.  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  Additionally, since he is already ESRD on HD, he will need a TDC placed.  I have scheduled this for tomorrow.  I had an extensive discussion with this patient in regards to the nature of access surgery, including risk, benefits, and alternatives.    The patient is aware that the risks of access surgery include but are not  limited to: bleeding, infection, steal syndrome, nerve damage, ischemic monomelic neuropathy, failure of access to mature, and possible need for additional access procedures in the future. The patient is aware the risks of tunneled dialysis catheter placement include but are not limited to: bleeding, infection, central venous injury, pneumothorax, possible venous stenosis, possible malpositioning in the venous system, and possible infections related to long-term catheter presence.  The patient was aware of these risks and agreed to proceed.  Adele Barthel, MD [facility] Office: [phone] Pager: [phone]  01/16/2012, 2:53 PM

## 2012-01-16 NOTE — Progress Notes (Signed)
Pt surgery date changed to 01/19/12, verified new arrival time 0830.

## 2012-01-19 ENCOUNTER — Encounter (HOSPITAL_COMMUNITY): Payer: Self-pay | Admitting: *Deleted

## 2012-01-19 ENCOUNTER — Ambulatory Visit (HOSPITAL_COMMUNITY): Payer: BC Managed Care – PPO

## 2012-01-19 ENCOUNTER — Ambulatory Visit (HOSPITAL_COMMUNITY)
Admission: RE | Admit: 2012-01-19 | Discharge: 2012-01-19 | Disposition: A | Discharge status: Home / Self Care | Payer: BC Managed Care – PPO | Source: Ambulatory Visit | Attending: Vascular Surgery | Admitting: Vascular Surgery

## 2012-01-19 ENCOUNTER — Encounter (HOSPITAL_COMMUNITY): Payer: Self-pay | Admitting: Anesthesiology

## 2012-01-19 ENCOUNTER — Telehealth: Payer: Self-pay | Admitting: Vascular Surgery

## 2012-01-19 ENCOUNTER — Ambulatory Visit (HOSPITAL_COMMUNITY): Payer: BC Managed Care – PPO | Admitting: Anesthesiology

## 2012-01-19 ENCOUNTER — Encounter (HOSPITAL_COMMUNITY)
Admission: RE | Disposition: A | Discharge status: Home / Self Care | Payer: Self-pay | Source: Ambulatory Visit | Attending: Vascular Surgery

## 2012-01-19 DIAGNOSIS — Z992 Dependence on renal dialysis: Secondary | ICD-10-CM | POA: Insufficient documentation

## 2012-01-19 DIAGNOSIS — E785 Hyperlipidemia, unspecified: Secondary | ICD-10-CM | POA: Insufficient documentation

## 2012-01-19 DIAGNOSIS — R51 Headache: Secondary | ICD-10-CM | POA: Insufficient documentation

## 2012-01-19 DIAGNOSIS — N186 End stage renal disease: Secondary | ICD-10-CM

## 2012-01-19 DIAGNOSIS — M109 Gout, unspecified: Secondary | ICD-10-CM | POA: Insufficient documentation

## 2012-01-19 DIAGNOSIS — I120 Hypertensive chronic kidney disease with stage 5 chronic kidney disease or end stage renal disease: Secondary | ICD-10-CM | POA: Insufficient documentation

## 2012-01-19 HISTORY — PX: INSERTION OF DIALYSIS CATHETER: SHX1324

## 2012-01-19 HISTORY — PX: AV FISTULA PLACEMENT: SHX1204

## 2012-01-19 LAB — POCT I-STAT 4, (NA,K, GLUC, HGB,HCT): Sodium: 142 mEq/L (ref 135–145)

## 2012-01-19 LAB — BASIC METABOLIC PANEL
Calcium: 8.3 mg/dL — ABNORMAL LOW (ref 8.4–10.5)
Creatinine, Ser: 23.55 mg/dL — ABNORMAL HIGH (ref 0.50–1.35)
GFR calc Af Amer: 2 mL/min — ABNORMAL LOW (ref >90)

## 2012-01-19 LAB — SURGICAL PCR SCREEN
MRSA, PCR: NEGATIVE
Staphylococcus aureus: NEGATIVE

## 2012-01-19 LAB — PROTIME-INR: Prothrombin Time: 13.3 seconds (ref 11.6–15.2)

## 2012-01-19 SURGERY — INSERTION OF DIALYSIS CATHETER
Anesthesia: General | Site: Arm Upper | Laterality: Right | Wound class: Clean

## 2012-01-19 MED ORDER — ONDANSETRON HCL 4 MG/2ML IJ SOLN
INTRAMUSCULAR | Status: DC | PRN
  Administered 2012-01-19: 4 mg via INTRAVENOUS

## 2012-01-19 MED ORDER — OXYCODONE HCL 5 MG PO TABS: 5.0000 mg | ORAL_TABLET | ORAL | Status: AC | PRN

## 2012-01-19 MED ORDER — SODIUM CHLORIDE 0.9 % IV SOLN: INTRAVENOUS | Status: DC

## 2012-01-19 MED ORDER — LIDOCAINE HCL (CARDIAC) 20 MG/ML IV SOLN
INTRAVENOUS | Status: DC | PRN
  Administered 2012-01-19: 100 mg via INTRAVENOUS

## 2012-01-19 MED ORDER — 0.9 % SODIUM CHLORIDE (POUR BTL) OPTIME
TOPICAL | Status: DC | PRN
  Administered 2012-01-19: 1000 mL

## 2012-01-19 MED ORDER — FENTANYL CITRATE 0.05 MG/ML IJ SOLN
INTRAMUSCULAR | Status: DC | PRN
  Administered 2012-01-19: 150 ug via INTRAVENOUS
  Administered 2012-01-19: 50 ug via INTRAVENOUS

## 2012-01-19 MED ORDER — ONDANSETRON HCL 4 MG/2ML IJ SOLN: 4.0000 mg | Freq: Once | INTRAMUSCULAR | Status: DC | PRN

## 2012-01-19 MED ORDER — MUPIROCIN 2 % EX OINT
TOPICAL_OINTMENT | CUTANEOUS | Status: AC
  Filled 2012-01-19: qty 22

## 2012-01-19 MED ORDER — HEPARIN SODIUM (PORCINE) 1000 UNIT/ML IJ SOLN
INTRAMUSCULAR | Status: DC | PRN
  Administered 2012-01-19: 4.6 mL

## 2012-01-19 MED ORDER — SODIUM CHLORIDE 0.9 % IV SOLN
INTRAVENOUS | Status: DC | PRN
  Administered 2012-01-19 (×2): via INTRAVENOUS

## 2012-01-19 MED ORDER — MUPIROCIN 2 % EX OINT: TOPICAL_OINTMENT | Freq: Two times a day (BID) | CUTANEOUS | Status: DC

## 2012-01-19 MED ORDER — HYDROMORPHONE HCL PF 1 MG/ML IJ SOLN
0.2500 mg | INTRAMUSCULAR | Status: DC | PRN
  Administered 2012-01-19 (×2): 0.5 mg via INTRAVENOUS

## 2012-01-19 MED ORDER — CEFAZOLIN SODIUM 1-5 GM-% IV SOLN
INTRAVENOUS | Status: AC
  Filled 2012-01-19: qty 100

## 2012-01-19 MED ORDER — PROPOFOL 10 MG/ML IV EMUL
INTRAVENOUS | Status: DC | PRN
  Administered 2012-01-19: 200 mg via INTRAVENOUS

## 2012-01-19 MED ORDER — HEPARIN SODIUM (PORCINE) 5000 UNIT/ML IJ SOLN
INTRAMUSCULAR | Status: DC | PRN
  Administered 2012-01-19: 12:00:00

## 2012-01-19 MED ORDER — CEFAZOLIN SODIUM 1-5 GM-% IV SOLN
INTRAVENOUS | Status: DC | PRN
  Administered 2012-01-19: 2 g via INTRAVENOUS

## 2012-01-19 SURGICAL SUPPLY — 64 items
ADH SKN CLS APL DERMABOND .7 (GAUZE/BANDAGES/DRESSINGS) ×2
BAG DECANTER FOR FLEXI CONT (MISCELLANEOUS) ×3 IMPLANT
CANISTER SUCTION 2500CC (MISCELLANEOUS) ×3 IMPLANT
CATH CANNON HEMO 15F 50CM (CATHETERS) IMPLANT
CATH CANNON HEMO 15FR 19 (HEMODIALYSIS SUPPLIES) IMPLANT
CATH CANNON HEMO 15FR 23CM (HEMODIALYSIS SUPPLIES) ×1 IMPLANT
CATH CANNON HEMO 15FR 31CM (HEMODIALYSIS SUPPLIES) IMPLANT
CATH CANNON HEMO 15FR 32 (HEMODIALYSIS SUPPLIES) IMPLANT
CATH CANNON HEMO 15FR 32CM (HEMODIALYSIS SUPPLIES) IMPLANT
CLIP TI MEDIUM 6 (CLIP) ×3 IMPLANT
CLIP TI WIDE RED SMALL 6 (CLIP) ×3 IMPLANT
CLOTH BEACON ORANGE TIMEOUT ST (SAFETY) ×3 IMPLANT
COVER PROBE W GEL 5X96 (DRAPES) ×3 IMPLANT
COVER SURGICAL LIGHT HANDLE (MISCELLANEOUS) ×6 IMPLANT
DECANTER SPIKE VIAL GLASS SM (MISCELLANEOUS) ×3 IMPLANT
DERMABOND ADVANCED (GAUZE/BANDAGES/DRESSINGS) ×1
DERMABOND ADVANCED .7 DNX12 (GAUZE/BANDAGES/DRESSINGS) ×2 IMPLANT
DRAIN PENROSE 1/2X12 LTX STRL (WOUND CARE) IMPLANT
DRAPE C-ARM 42X72 X-RAY (DRAPES) ×3 IMPLANT
DRAPE CHEST BREAST 15X10 FENES (DRAPES) ×3 IMPLANT
ELECT REM PT RETURN 9FT ADLT (ELECTROSURGICAL) ×3
ELECTRODE REM PT RTRN 9FT ADLT (ELECTROSURGICAL) ×2 IMPLANT
GAUZE SPONGE 2X2 8PLY STRL LF (GAUZE/BANDAGES/DRESSINGS) ×2 IMPLANT
GAUZE SPONGE 4X4 16PLY XRAY LF (GAUZE/BANDAGES/DRESSINGS) ×3 IMPLANT
GLOVE BIO SURGEON STRL SZ7 (GLOVE) ×4 IMPLANT
GLOVE BIOGEL M 6.5 STRL (GLOVE) ×3 IMPLANT
GLOVE BIOGEL M 8.0 STRL (GLOVE) ×1 IMPLANT
GLOVE BIOGEL PI IND STRL 6.5 (GLOVE) IMPLANT
GLOVE BIOGEL PI IND STRL 7.0 (GLOVE) ×2 IMPLANT
GLOVE BIOGEL PI IND STRL 7.5 (GLOVE) ×2 IMPLANT
GLOVE BIOGEL PI INDICATOR 6.5 (GLOVE) ×3
GLOVE BIOGEL PI INDICATOR 7.0 (GLOVE) ×1
GLOVE BIOGEL PI INDICATOR 7.5 (GLOVE) ×4
GOWN STRL NON-REIN LRG LVL3 (GOWN DISPOSABLE) ×9 IMPLANT
KIT BASIN OR (CUSTOM PROCEDURE TRAY) ×3 IMPLANT
KIT ROOM TURNOVER OR (KITS) ×3 IMPLANT
NDL 18GX1X1/2 (RX/OR ONLY) (NEEDLE) ×2 IMPLANT
NEEDLE 18GX1X1/2 (RX/OR ONLY) (NEEDLE) ×3 IMPLANT
NEEDLE HYPO 25GX1X1/2 BEV (NEEDLE) ×3 IMPLANT
NS IRRIG 1000ML POUR BTL (IV SOLUTION) ×3 IMPLANT
PACK CV ACCESS (CUSTOM PROCEDURE TRAY) ×3 IMPLANT
PACK SURGICAL SETUP 50X90 (CUSTOM PROCEDURE TRAY) ×2 IMPLANT
PAD ARMBOARD 7.5X6 YLW CONV (MISCELLANEOUS) ×6 IMPLANT
SOAP 2 % CHG 4 OZ (WOUND CARE) ×3 IMPLANT
SPONGE GAUZE 2X2 STER 10/PKG (GAUZE/BANDAGES/DRESSINGS) ×1
SPONGE GAUZE 4X4 12PLY (GAUZE/BANDAGES/DRESSINGS) ×1 IMPLANT
SPONGE SURGIFOAM ABS GEL 100 (HEMOSTASIS) IMPLANT
SUT ETHILON 3 0 PS 1 (SUTURE) ×3 IMPLANT
SUT MNCRL AB 4-0 PS2 18 (SUTURE) ×5 IMPLANT
SUT PROLENE 6 0 BV (SUTURE) ×1 IMPLANT
SUT PROLENE 7 0 BV 1 (SUTURE) ×3 IMPLANT
SUT VIC AB 3-0 SH 27 (SUTURE) ×3
SUT VIC AB 3-0 SH 27X BRD (SUTURE) ×2 IMPLANT
SYR 20CC LL (SYRINGE) ×6 IMPLANT
SYR 30ML LL (SYRINGE) IMPLANT
SYR 3ML LL SCALE MARK (SYRINGE) ×3 IMPLANT
SYR 5ML LL (SYRINGE) ×3 IMPLANT
SYR CONTROL 10ML LL (SYRINGE) ×3 IMPLANT
SYRINGE 10CC LL (SYRINGE) ×3 IMPLANT
TAPE CLOTH SURG 4X10 WHT LF (GAUZE/BANDAGES/DRESSINGS) ×1 IMPLANT
TOWEL OR 17X24 6PK STRL BLUE (TOWEL DISPOSABLE) ×4 IMPLANT
TOWEL OR 17X26 10 PK STRL BLUE (TOWEL DISPOSABLE) ×3 IMPLANT
UNDERPAD 30X30 INCONTINENT (UNDERPADS AND DIAPERS) ×3 IMPLANT
WATER STERILE IRR 1000ML POUR (IV SOLUTION) ×3 IMPLANT

## 2012-01-19 NOTE — Anesthesia Preprocedure Evaluation (Addendum)
Anesthesia Evaluation  Patient identified by MRN, date of birth, ID band Patient awake    Reviewed: Allergy & Precautions, H&P , NPO status , Patient's Chart, lab work & pertinent test results  Airway Mallampati: I TM Distance: >3 FB Neck ROM: full    Dental  (+) Dental Advisory Given   Pulmonary          Cardiovascular hypertension, Rhythm:regular Rate:Normal     Neuro/Psych  Headaches,    GI/Hepatic   Endo/Other    Renal/GU Dialysis, ESRF and CRF     Musculoskeletal   Abdominal   Peds  Hematology   Anesthesia Other Findings   Reproductive/Obstetrics                          Anesthesia Physical Anesthesia Plan  ASA: III  Anesthesia Plan: General   Post-op Pain Management:    Induction: Intravenous  Airway Management Planned: Oral ETT and LMA  Additional Equipment:   Intra-op Plan:   Post-operative Plan: Extubation in OR  Informed Consent: I have reviewed the patients History and Physical, chart, labs and discussed the procedure including the risks, benefits and alternatives for the proposed anesthesia with the patient or authorized representative who has indicated his/her understanding and acceptance.     Plan Discussed with: CRNA, Anesthesiologist and Surgeon  Anesthesia Plan Comments:         Anesthesia Quick Evaluation

## 2012-01-19 NOTE — Discharge Instructions (Signed)
    GO TO YOUR OUTPATIENT DIALYSIS CENTER TOMORROW -01/20/12 AT YOUR REGULARLY SCHEDULED TIME         Instructions Following General Anesthetic, Adult A nurse specialized in giving anesthesia (anesthetist) or a doctor specialized in giving anesthesia (anesthesiologist) gave you a medicine that made you sleep while a procedure was performed. For as long as 24 hours following this procedure, you may feel:  Dizzy.   Weak.   Drowsy.  AFTER THE PROCEDURE After surgery, you will be taken to the recovery area where a nurse will monitor your progress. You will be allowed to go home when you are awake, stable, taking fluids well, and without complications. For the first 24 hours following an anesthetic:  Have a responsible person with you.   Do not drive a car. If you are alone, do not take public transportation.   Do not drink alcohol.   Do not take medicine that has not been prescribed by your caregiver.   Do not sign important papers or make important decisions.   You may resume normal diet and activities as directed.   Change bandages (dressings) as directed.   Only take over-the-counter or prescription medicines for pain, discomfort, or fever as directed by your caregiver.  If you have questions or problems that seem related to the anesthetic, call the hospital and ask for the anesthetist or anesthesiologist on call. SEEK IMMEDIATE MEDICAL CARE IF:   You develop a rash.   You have difficulty breathing.   You have chest pain.   You develop any allergic problems.  Document Released: 11/03/2000 Document Revised: 07/17/2011 Document Reviewed: 06/14/2007 [facility] Patient Information 2012 [place].

## 2012-01-19 NOTE — Anesthesia Postprocedure Evaluation (Signed)
  Anesthesia Post-op Note  Patient: [name]  Procedure(s) Performed: Procedure(s) (LRB): INSERTION OF DIALYSIS CATHETER (N/A) ARTERIOVENOUS (AV) FISTULA CREATION (Right)  Patient Location: PACU  Anesthesia Type: General  Level of Consciousness: awake, alert , oriented and patient cooperative  Airway and Oxygen Therapy: Patient Spontanous Breathing and Patient connected to nasal cannula oxygen  Post-op Pain: mild  Post-op Assessment: Post-op Vital signs reviewed, Patient's Cardiovascular Status Stable, Respiratory Function Stable, Patent Airway, No signs of Nausea or vomiting and Pain level controlled  Post-op Vital Signs: stable  Complications: No apparent anesthesia complications

## 2012-01-19 NOTE — OR Nursing (Signed)
First procedure insertion of dialysis catheter ended at 1313,second procedure creation of right arm fistula started at 1335.

## 2012-01-19 NOTE — Transfer of Care (Signed)
Immediate Anesthesia Transfer of Care Note  Patient: [name]  Procedure(s) Performed: Procedure(s) (LRB): INSERTION OF DIALYSIS CATHETER (N/A) ARTERIOVENOUS (AV) FISTULA CREATION (Right)  Patient Location: PACU  Anesthesia Type: General  Level of Consciousness: awake, alert  and oriented  Airway & Oxygen Therapy: Patient Spontanous Breathing and Patient connected to nasal cannula oxygen  Post-op Assessment: Report given to PACU RN and Post -op Vital signs reviewed and stable  Post vital signs: Reviewed and stable  Complications: No apparent anesthesia complications

## 2012-01-19 NOTE — Telephone Encounter (Addendum)
Message copied by Lujean Amel on Mon Jan 19, 2012  3:24 PM ------      Message from: [name]      Created: Mon Jan 19, 2012  2:51 PM                   ----- Message -----         From: [name], [place]         Sent: 01/19/2012   2:45 PM           To: [name], RN            4 week F/U Dr Bridgett Larsson; AVF  I scheduled an appointment for pt on Fri 02/27/12 at 1:15pm. I mailed an appt letter to pt but was unable to reach pt by telephone because his voicemail was full. [name]

## 2012-01-19 NOTE — H&P (Addendum)
[facility] [place]  Brief History and Physical  History of Present Illness  [name] is a 61 y.o. male who presents with chief complaint: ESRD.  The patient presents today for R [facility] AVF placement, TDC placement.    Past Medical History  Diagnosis Date  . Headache   . Hyperlipidemia   . HTN (hypertension)   . Renal failure   . Anemia   . 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 Gout   . Anemia   . Benign colon polyp 2012  . Clot     STATED TOLD IN APRIL CLOT TO LEFT SHOULDER. DOCTOR MADE AWARE  . PONV (postoperative nausea and vomiting)     Past Surgical History  Procedure Date  . Av fistula placement, radiocephalic [id number]    Right arm    History   Social History  . Marital Status: Married    Spouse Name: N/A    Number of Children: 34  . Years of Education: N/A   Occupational History  . PhD Engineer   .     Social History Main Topics  . Smoking status: Never Smoker   . Smokeless tobacco: Never Used  . Alcohol Use: No     1 drink a month or non  . Drug Use: No  . Sexually Active: Yes    Birth Control/ Protection: Condom   Other Topics Concern  . Not on file   Social History Narrative   Regular Exercise -  YES    Family History  Problem Relation Age of Onset  . Diabetes Father   . Colon cancer Neg Hx   . Esophageal cancer Neg Hx   . Stomach cancer Neg Hx   . Rectal cancer Neg Hx     Current Facility-Administered Medications on File Prior to Encounter  Medication Dose Route Frequency Provider Last Rate Last Dose  . 0.9 %  sodium chloride infusion  500 mL Intravenous Continuous Lafayette Dragon, MD       Current Outpatient Prescriptions on File Prior to Encounter  Medication Sig Dispense Refill  . calcium acetate (PHOSLO) 667 MG capsule Take 667 mg by mouth 3 (three) times daily with meals.      . furosemide (LASIX) 40 MG tablet Take 40 mg by mouth daily as needed. For fluid; not on dialysis  days      . multivitamin (RENA-VIT) TABS tablet Take 1 tablet by mouth daily.      . B Complex-C-Folic Acid (RENA-VITE RX) 1 MG TABS       . calcitRIOL (ROCALTROL) 0.5 MCG capsule       . ethyl chloride spray       . hydrALAZINE (APRESOLINE) 25 MG tablet         Allergies  Allergen Reactions  . Hydrocodone Itching    Review of Systems: As listed above, otherwise negative.  Physical Examination  Filed Vitals:   01/19/12 0859  BP: 130/74  Pulse: 64  Temp: 97.3 F (36.3 C)  TempSrc: Oral  Resp: 18  SpO2: 98%    General: A&O x 3, WDWN  Pulmonary: Sym exp, good air movt, CTAB, no rales, rhonchi, & wheezing  Cardiac: RRR, Nl S1, S2, no Murmurs, rubs or gallops  Gastrointestinal: soft, NTND, -G/R, - HSM, - masses, - CVAT B  Musculoskeletal: M/S 5/5 throughout , Extremities without ischemic changes , thrombosed R RC AVF  Laboratory See [facility]  Medical Decision  Making  [name] is a 61 y.o. male who presents with: ESRD.   The patient is scheduled for: Placement of R BC AVF, TDC  Risk, benefits, and alternatives to access surgery were discussed.  The patient is aware the risks include but are not limited to: bleeding, infection, steal syndrome, nerve damage, ischemic monomelic neuropathy, failure to mature, and need for additional procedures.  The patient is aware is at risk for outflow stenosis on the right side, so he may have significant swelling postoperatively.  An intraoperative venogram may be needed and there is a possibility no arm access can be completed.  The patient is aware of the risks and agrees to proceed.  Adele Barthel, MD [facility] [place] Office: [phone] Pager: [phone]  01/19/2012, 7:29 AM

## 2012-01-19 NOTE — Op Note (Signed)
OPERATIVE NOTE  PROCEDURE: 1. Right brachiocephalic arteriovenous fistula placement 2. Right internal jugular vein vein tunneled dialysis catheter placement 3. Right internal jugular vein vein cannulation under ultrasound guidance  PRE-OPERATIVE DIAGNOSIS: end-stage renal failure  POST-OPERATIVE DIAGNOSIS: same as above  SURGEON: Hinda Lenis, MD  ASSISTANT(S): Wray Kearns, PAC  ANESTHESIA: general  ESTIMATED BLOOD LOSS: minimal  FINDING(S): 1.  Tips of the catheter in the right atrium on fluoroscopy 2.  No obvious pneumothorax on fluoroscopy  SPECIMEN(S):  none  INDICATIONS:   [name] is a 61 y.o. male who presents with end stage renal disease.  The patient presents for tunneled dialysis catheter and right brachiocephalic arteriovenous fistula placement.  The patient is aware the risks of tunneled dialysis catheter placement include but are not limited to: bleeding, infection, central venous injury, pneumothorax, possible venous stenosis, possible malpositioning in the venous system, and possible infections related to long-term catheter presence. The patient is also aware he is at risk for failure of maturation and possible right arm swelling due to possible venous stenosis. The patient was aware of these risks and agreed to proceed.    DESCRIPTION: After written full informed consent was obtained from the patient, the patient was taken back to the operating room.  Prior to induction, the patient was given IV antibiotics.  After obtaining adequate sedation, the patient was prepped and draped in the standard fashion for a chest or neck tunneled dialysis catheter placement.  I anesthesized the neck cannulation site with local anesthetic, then under ultrasound guidance, the right internal jugular vein vein was cannulated with the 18 gauge needle.  A J-wire was then placed down in the right ventricle under fluroscopic guidance.  The wire was then secured in place with  a clamp to the drapes.  I then made stab incisions are the neck and exit sites.  I dissected from the chest to the neck and dilated the subcutaneous tunnel with a plastic dilator.  The wire was then unclamped and I removed the needle.  The skin tract and venotomy was dilated serially with dilators.  Finally, the dilator-sheath was placed under fluroscopic guidance into the superior vena cava.  The dilator and wire were removed.  A 23 cm Diatek catheter was placed under fluoroscopic guidance down into the right atrium.  The sheath was broken and peeled away while holding the catheter cuff at the level of the skin.  The back end of this catheter was transected, revealing the two lumens of this catheter.  The ports were docked onto these two lumens.  The catheter hub was then screwed into place.  Each port was tested by aspirating and flushing.  No resistance was noted.  Each port was then thoroughly flushed with heparinized saline.  The catheter was secured in placed with two interrupted stitches of 3-0 Nylon tied to the catheter.  The neck incision was closed with a U-stitch of 4-0 Monocryl.  The neck and chest incision were cleaned and sterile bandages applied.  Each port was then loaded with concentrated heparin (1000 Units/mL) at the manufacturer recommended volumes to each port.  Sterile caps were applied to each port.  On completion fluoroscopy, the tips of the catheter were in the right atrium, and there was no evidence of pneumothorax.  At this point, the drapes were taken down.  The patient was then reprepped and draped in the standard fashion for a right arm access procedure.  I turned my attention first to identifying the patient's cephalic  vein and brachial artery.  Using SonoSite guidance, the location of these vessels were marked out on the skin.   I made a transverse incision at the level of the antecubitum and dissected through the subcutaneous tissue and fascia to gain exposure of the brachial  artery.  This was noted to be 4 mm in diameter externally.  This was dissected out proximally and distally and controlled with vessel loops .  I then dissected out the cephalic vein.  This was noted to be 3-4 mm in diameter externally.  The distal segment of the vein was ligated with a  2-0 silk, and the vein was transected.  The proximal segment was iinterrogated with serial dilators.  The vein accepted up to a 3 mm dilator without any difficulty.  I then instilled the heparinized saline into the vein and clamped it.  At this point, I reset my exposure of the brachial artery and placed the artery under tension proximally and distally.  I made an arteriotomy with a #11 blade, and then I extended the arteriotomy with a Potts scissor.  I injected heparinized saline proximal and distal to this arteriotomy.  The vein was then sewn to the artery in an end-to-side configuration with a running stitch of 7-0 Prolene.  Prior to completing this anastomosis, I allowed the vein and artery to backbleed.  There was no evidence of clot from any vessels.  I completed the anastomosis in the usual fashion and then released all vessel loops and clamps.  There was a palpable  thrill in the venous outflow, and there was a palpable radial pulse.  At this point, I irrigated out the surgical wound.  There was no further active bleeding.  The subcutaneous tissue was reapproximated with a running stitch of 3-0 Vicryl.  The skin was then reapproximated with a running subcuticular stitch of 4-0 Vicryl.  The skin was then cleaned, dried, and reinforced with Dermabond.  The patient tolerated this procedure well.   COMPLICATIONS: none  CONDITION: stable   Adele Barthel, MD [facility] [place] Office: [phone] Pager: [phone]  01/19/2012, 1:19 PM

## 2012-01-19 NOTE — Preoperative (Signed)
Beta Blockers   Reason not to administer Beta Blockers:coreg this am 01/19/12

## 2012-01-19 NOTE — Anesthesia Procedure Notes (Signed)
Procedure Name: LMA Insertion Date/Time: 01/19/2012 12:46 PM Performed by: Sherilyn Banker Pre-anesthesia Checklist: Patient identified, Emergency Drugs available, Suction available, Patient being monitored and Timeout performed Patient Re-evaluated:Patient Re-evaluated prior to inductionOxygen Delivery Method: Circle system utilized Preoxygenation: Pre-oxygenation with 100% oxygen LMA: LMA inserted and LMA with gastric port inserted LMA Size: 5.0 Number of attempts: 1 Tube secured with: Tape Dental Injury: Teeth and Oropharynx as per pre-operative assessment

## 2012-01-20 ENCOUNTER — Encounter (HOSPITAL_COMMUNITY): Payer: Self-pay | Admitting: Vascular Surgery

## 2012-01-21 ENCOUNTER — Ambulatory Visit: Payer: BC Managed Care – PPO | Admitting: Vascular Surgery

## 2012-02-27 ENCOUNTER — Ambulatory Visit: Payer: BC Managed Care – PPO | Admitting: Vascular Surgery

## 2012-03-04 ENCOUNTER — Encounter: Payer: Self-pay | Admitting: Vascular Surgery

## 2012-03-05 ENCOUNTER — Encounter (INDEPENDENT_AMBULATORY_CARE_PROVIDER_SITE_OTHER): Payer: BC Managed Care – PPO | Admitting: *Deleted

## 2012-03-05 ENCOUNTER — Encounter: Payer: Self-pay | Admitting: Vascular Surgery

## 2012-03-05 ENCOUNTER — Ambulatory Visit (INDEPENDENT_AMBULATORY_CARE_PROVIDER_SITE_OTHER): Payer: BC Managed Care – PPO | Admitting: Vascular Surgery

## 2012-03-05 VITALS — BP 179/93 | HR 74 | Temp 98.6°F | Ht 70.0 in | Wt 173.5 lb

## 2012-03-05 DIAGNOSIS — T82898A Other specified complication of vascular prosthetic devices, implants and grafts, initial encounter: Secondary | ICD-10-CM

## 2012-03-05 DIAGNOSIS — N186 End stage renal disease: Secondary | ICD-10-CM

## 2012-03-05 DIAGNOSIS — M79609 Pain in unspecified limb: Secondary | ICD-10-CM

## 2012-03-05 DIAGNOSIS — M79641 Pain in right hand: Secondary | ICD-10-CM

## 2012-03-05 MED ORDER — INDOMETHACIN 50 MG PO CAPS: 50.0000 mg | ORAL_CAPSULE | Freq: Two times a day (BID) | ORAL | Status: AC

## 2012-03-05 NOTE — Progress Notes (Signed)
[facility] [place]  Postoperative Access Visit  History of Present Illness  [name] is a 61 y.o. year old male who presents for postoperative follow-up for: R BC AVF (Date: 01/19/12).  The patient's wounds are healed.  The patient noted no steal symptoms until this Tuesday.  Patient acutely developed weakness and pain in right hand and forearm.  He describes this pain like an acute gout attack.   The patient is now not able to complete his activities of daily living.  He had no problems with his right hand until Tuesday.  He denies any prior strokes or TIAs.  Physical Examination  Filed Vitals:   03/05/12 1015  BP: 179/93  Pulse: 74  Temp: 98.6 F (37 C)   RUE: Incision is healed, skin feels warm except finger tips, hand grip is 2-3/5, sensation in digits is intact, palpable thrill, bruit can be auscultated , palpable radial pulse, finger appear somewhay cyanotic but are identical to his left hand which has no access  L arm access duplex (Date: 03/05/12)  RUE: WBI 1.18, biphasic radial and ulnar, Pressure augments with compression: radial: 181 -> 199, ulnar: 184 -> 234  LUE: WBI 1.06, triphasic radial and ulnar  Medical Decision Making  [name] is a 61 y.o. year old male who presents s/p R BC AVF.  This pt's sx are NOT c/w steal syndrome especially since he had acute onset.  His steal exam is c/w with mild steal at most.  There are no ischemic findings in his hand.  The pt is taking Oxycodone without any improvement in pain.  I am prescribing him Indocin to see if a NSAID may help his sx.  If so, this suggests this is inflammatory in nature.  It is not clear to me the etiology of his pain, so I am referring him to a Hand Surgeon ASAP to see if perhaps this is a musculoskeletal in etiology.  I'll have him follow up after that appointment.  The patient's access will be ready for use in 4 weeks.  Thank you for allowing [place] to participate  in this patient's care.  Adele Barthel, MD [facility] [place] Office: [phone] Pager: [phone]

## 2012-03-08 ENCOUNTER — Other Ambulatory Visit: Payer: Self-pay

## 2012-04-05 DIAGNOSIS — N186 End stage renal disease: Secondary | ICD-10-CM

## 2012-05-06 ENCOUNTER — Other Ambulatory Visit: Payer: Self-pay | Admitting: *Deleted

## 2012-05-10 ENCOUNTER — Encounter: Payer: Self-pay | Admitting: Physician Assistant

## 2012-05-10 ENCOUNTER — Encounter (HOSPITAL_COMMUNITY)
Admission: RE | Admit: 2012-05-10 | Discharge: 2012-05-10 | Disposition: A | Discharge status: Home / Self Care | Payer: Medicare Other | Source: Ambulatory Visit | Attending: Vascular Surgery | Admitting: Vascular Surgery

## 2012-05-10 DIAGNOSIS — Z452 Encounter for adjustment and management of vascular access device: Secondary | ICD-10-CM | POA: Insufficient documentation

## 2012-05-10 DIAGNOSIS — N186 End stage renal disease: Secondary | ICD-10-CM

## 2012-05-10 DIAGNOSIS — D638 Anemia in other chronic diseases classified elsewhere: Secondary | ICD-10-CM | POA: Insufficient documentation

## 2012-05-10 NOTE — Patient Instructions (Signed)
[facility] Catheter Removal Instructions   Please sit up at least 30 degrees for 4 hours then may lay flat   MAY REMOVE DRESSINGS IN AM AND MAY SHOWER   REPLACE DRESSING OVER EXIT SITE AS NEEDED   IF YOU SHOULD NOTE BLEEDING OR SWELLING AT THE NECK HOLD PRESSURE OVER THE DRESSINGS FOR 15 MINUTES, IF BLEEDING PERSISTS COME TO ER OR CALL [phone]

## 2012-05-10 NOTE — Progress Notes (Signed)
[facility] SHORT STAY H&P  CC:  Catheter removal   HPI:  [name] is a 61 y.o. year old male who presents for postoperative follow-up for: R BC AVF (Date: 01/19/12).   He is here today for removal of his diatek catheter right IJ.    Past Medical History  Diagnosis Date  . Headache   . Hyperlipidemia   . HTN (hypertension)   . Renal failure   . Anemia   . 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 Gout   . Anemia   . Benign colon polyp 2012  . Clot     STATED TOLD IN APRIL CLOT TO LEFT SHOULDER. DOCTOR MADE AWARE  . PONV (postoperative nausea and vomiting)     FH:  Non-Contributory  History   Social History  . Marital Status: Married    Spouse Name: N/A    Number of Children: 47  . Years of Education: N/A   Occupational History  . PhD Engineer   .     Social History Main Topics  . Smoking status: Never Smoker   . Smokeless tobacco: Never Used  . Alcohol Use: No     1 drink a month or non  . Drug Use: No  . Sexually Active: Yes    Birth Control/ Protection: Condom   Other Topics Concern  . Not on file   Social History Narrative   Regular Exercise -  YES    Allergies  Allergen Reactions  . Hydrocodone Itching    Current Outpatient Prescriptions  Medication Sig Dispense Refill  . B Complex-C-Folic Acid (RENA-VITE RX) 1 MG TABS       . calcitRIOL (ROCALTROL) 0.5 MCG capsule       . calcium acetate (PHOSLO) 667 MG capsule Take 667 mg by mouth 3 (three) times daily with meals.      . carvedilol (COREG) 25 MG tablet Take 25 mg by mouth 2 (two) times daily with a meal.      . ethyl chloride spray       . furosemide (LASIX) 40 MG tablet Take 40 mg by mouth daily as needed. For fluid; not on dialysis days      . hydrALAZINE (APRESOLINE) 25 MG tablet       . indomethacin (INDOCIN) 50 MG capsule Take 1 capsule (50 mg total) by mouth 2 (two) times daily with a meal.  60 capsule  1  . multivitamin (RENA-VIT) TABS  tablet Take 1 tablet by mouth daily.      Marland Kitchen NIFEdipine (PROCARDIA XL/ADALAT-CC) 60 MG 24 hr tablet Take 60 mg by mouth daily.      Marland Kitchen oxyCODONE (OXY IR/ROXICODONE) 5 MG immediate release tablet       . traMADol (ULTRAM) 50 MG tablet Take 1 tablet by mouth Daily.       Current Facility-Administered Medications  Medication Dose Route Frequency Provider Last Rate Last Dose  . 0.9 %  sodium chloride infusion  500 mL Intravenous Continuous Lafayette Dragon, MD        ROS:  See HPI  PHYSICAL EXAM  There were no vitals filed for this visit.  Gen:  WNL Neck:  Palpable right IJ catheter Heart:  RRR Lungs:  CTA Right AV fistula with palpable thrill.  No sign of steal   Lab/X-ray:  Impression: This is a 60 y.o. male here for diatek catheter removal  Plan:  Removal of right diatek  catheter  Laurence Slate [facility]  PA-C [facility] [phone] 05/10/2012 11:37 AM    [facility] Catheter Removal Procedure Note  Diagnosis: ESRD with Functioning AVF/AVGG  Plan:  Remove right diatek catheter  Consent signed:  yes Time out completed:  yes Coumadin:  no PT/INR (if applicable):   Other labs:   Procedure: 1.  Sterile prepping and draping over catheter area 2. 6 ml 2% lidocaine plain instilled at removal site. 3.  right catheter removed in its entirety with cuff in tact. 4.  Complications: none  5. Tip of catheter sent for culture:  no   Patient tolerated procedure well:  yes Pressure held, no bleeding noted, dressing applied Instructions given to the pt regarding wound care and bleeding.  OtherLaurence Slate [facility] 05/10/2012 11:46 AM

## 2012-07-01 ENCOUNTER — Other Ambulatory Visit (HOSPITAL_COMMUNITY): Payer: Self-pay | Admitting: Nephrology

## 2012-07-01 DIAGNOSIS — N186 End stage renal disease: Secondary | ICD-10-CM

## 2012-07-02 ENCOUNTER — Other Ambulatory Visit (HOSPITAL_COMMUNITY): Payer: Self-pay | Admitting: Nephrology

## 2012-07-02 ENCOUNTER — Encounter (HOSPITAL_COMMUNITY): Payer: Self-pay

## 2012-07-02 ENCOUNTER — Ambulatory Visit (HOSPITAL_COMMUNITY)
Admission: RE | Admit: 2012-07-02 | Discharge: 2012-07-02 | Disposition: A | Discharge status: Home / Self Care | Payer: BC Managed Care – HMO | Source: Ambulatory Visit | Attending: Nephrology | Admitting: Nephrology

## 2012-07-02 VITALS — BP 156/90 | HR 66 | Temp 97.6°F | Resp 16

## 2012-07-02 DIAGNOSIS — N186 End stage renal disease: Secondary | ICD-10-CM

## 2012-07-02 DIAGNOSIS — M109 Gout, unspecified: Secondary | ICD-10-CM | POA: Insufficient documentation

## 2012-07-02 DIAGNOSIS — T82898A Other specified complication of vascular prosthetic devices, implants and grafts, initial encounter: Secondary | ICD-10-CM | POA: Insufficient documentation

## 2012-07-02 DIAGNOSIS — Z860100 Personal history of colon polyps, unspecified: Secondary | ICD-10-CM | POA: Insufficient documentation

## 2012-07-02 DIAGNOSIS — Z79899 Other long term (current) drug therapy: Secondary | ICD-10-CM | POA: Insufficient documentation

## 2012-07-02 DIAGNOSIS — Z992 Dependence on renal dialysis: Secondary | ICD-10-CM | POA: Insufficient documentation

## 2012-07-02 DIAGNOSIS — I120 Hypertensive chronic kidney disease with stage 5 chronic kidney disease or end stage renal disease: Secondary | ICD-10-CM | POA: Insufficient documentation

## 2012-07-02 DIAGNOSIS — E785 Hyperlipidemia, unspecified: Secondary | ICD-10-CM | POA: Insufficient documentation

## 2012-07-02 DIAGNOSIS — Y832 Surgical operation with anastomosis, bypass or graft as the cause of abnormal reaction of the patient, or of later complication, without mention of misadventure at the time of the procedure: Secondary | ICD-10-CM | POA: Insufficient documentation

## 2012-07-02 LAB — POTASSIUM: Potassium: 5.4 mEq/L — ABNORMAL HIGH (ref 3.5–5.1)

## 2012-07-02 MED ORDER — ALTEPLASE 2 MG IJ SOLR
2.0000 mg | Freq: Once | INTRAMUSCULAR | Status: DC
  Filled 2012-07-02: qty 2

## 2012-07-02 MED ORDER — FENTANYL CITRATE 0.05 MG/ML IJ SOLN
INTRAMUSCULAR | Status: AC
  Filled 2012-07-02: qty 4

## 2012-07-02 MED ORDER — FENTANYL CITRATE 0.05 MG/ML IJ SOLN
INTRAMUSCULAR | Status: DC | PRN
  Administered 2012-07-02 (×2): 50 ug via INTRAVENOUS

## 2012-07-02 MED ORDER — MIDAZOLAM HCL 2 MG/2ML IJ SOLN
INTRAMUSCULAR | Status: DC | PRN
  Administered 2012-07-02: 2 mg via INTRAVENOUS

## 2012-07-02 MED ORDER — SODIUM CHLORIDE 0.9 % IV SOLN
INTRAVENOUS | Status: DC | PRN
  Administered 2012-07-02: 20 mL/h via INTRAVENOUS

## 2012-07-02 MED ORDER — IOHEXOL 300 MG/ML  SOLN
100.0000 mL | Freq: Once | INTRAMUSCULAR | Status: AC | PRN
  Administered 2012-07-02: 50 mL via INTRAVENOUS

## 2012-07-02 MED ORDER — HEPARIN SODIUM (PORCINE) 1000 UNIT/ML IJ SOLN
INTRAMUSCULAR | Status: AC
  Administered 2012-07-02: 3000 [IU]
  Filled 2012-07-02: qty 1

## 2012-07-02 MED ORDER — MIDAZOLAM HCL 2 MG/2ML IJ SOLN
INTRAMUSCULAR | Status: AC
  Filled 2012-07-02: qty 6

## 2012-07-02 NOTE — H&P (Signed)
Chief Complaint: "My dialysis graft is clotted" Referring Physician:Zeyfang HPI: [name] is an 61 y.o. male with ESRD who has a Rt UE AVG that has thrombosed. HE had this happen before in 5/13. He was doing well with it until yesterday when it clotted during his HD procedure. He is here for declot procedure. PMHx and meds reviewed.  Past Medical History:  Past Medical History  Diagnosis Date  . Headache   . Hyperlipidemia   . HTN (hypertension)   . Renal failure   . Anemia   . 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 Gout   . Anemia   . Benign colon polyp 2012  . Clot     STATED TOLD IN APRIL CLOT TO LEFT SHOULDER. DOCTOR MADE AWARE  . PONV (postoperative nausea and vomiting)     Past Surgical History:  Past Surgical History  Procedure Date  . Av fistula placement, radiocephalic 123XX123    Right arm  . Insertion of dialysis catheter 01/19/2012    Procedure: INSERTION OF DIALYSIS CATHETER;  Surgeon: Conrad Minoa, MD;  Location: [place];  Service: Vascular;  Laterality: N/A;  right internal jugular vein  . Av fistula placement 01/19/2012    Procedure: ARTERIOVENOUS (AV) FISTULA CREATION;  Surgeon: Conrad Romoland, MD;  Location: [place];  Service: Vascular;  Laterality: Right;  Right Brachio-cephalic arteriovenous fistula    Family History:  Family History  Problem Relation Age of Onset  . Diabetes Father   . Colon cancer Neg Hx   . Esophageal cancer Neg Hx   . Stomach cancer Neg Hx   . Rectal cancer Neg Hx     Social History:  reports that he has never smoked. He has never used smokeless tobacco. He reports that he does not drink alcohol or use illicit drugs.  Allergies:  Allergies  Allergen Reactions  . Hydrocodone Itching    Medications: Current Outpatient Prescriptions   Medication  Sig  Dispense  Refill   .  B Complex-C-Folic Acid (RENA-VITE RX) 1 MG TABS      .  calcitRIOL (ROCALTROL) 0.5 MCG capsule      .  calcium acetate  (PHOSLO) 667 MG capsule  Take 667 mg by mouth 3 (three) times daily with meals.     .  carvedilol (COREG) 25 MG tablet  Take 25 mg by mouth 2 (two) times daily with a meal.     .  ethyl chloride spray      .  furosemide (LASIX) 40 MG tablet  Take 40 mg by mouth daily as needed. For fluid; not on dialysis days     .  hydrALAZINE (APRESOLINE) 25 MG tablet      .  indomethacin (INDOCIN) 50 MG capsule  Take 1 capsule (50 mg total) by mouth 2 (two) times daily with a meal.  60 capsule  1   .  multivitamin (RENA-VIT) TABS tablet  Take 1 tablet by mouth daily.     Marland Kitchen  NIFEdipine (PROCARDIA XL/ADALAT-CC) 60 MG 24 hr tablet  Take 60 mg by mouth daily.     Marland Kitchen  oxyCODONE (OXY IR/ROXICODONE) 5 MG immediate release tablet      .  traMADol (ULTRAM) 50 MG tablet  Take 1 tablet by mouth Daily       Please HPI for pertinent positives, otherwise complete 10 system ROS negative.  Physical Exam: Blood pressure 161/96, pulse 74, temperature 97.6 F (36.4 C),  resp. rate 13, SpO2 100.00%. There is no height or weight on file to calculate BMI.   General Appearance:  Alert, cooperative, no distress, appears stated age  Head:  Normocephalic, without obvious abnormality, atraumatic  ENT: Unremarkable  Neck: Supple, symmetrical, trachea midline, no adenopathy, thyroid: not enlarged, symmetric, no tenderness/mass/nodules  Lungs:   Clear to auscultation bilaterally, no w/r/r, respirations unlabored without use of accessory muscles.  Heart:  Regular rate and rhythm, S1, S2 normal, no murmur, rub or gallop. Carotids 2+ without bruit.  Extremities: Rt UE AVG without pulse. Hand warm  Neurologic: Normal affect, no gross deficits.   No results found for this or any previous visit (from the past 48 hour(s)). No results found.  Assessment/Plan Thrombosed Rt UE AVG For thrombectomy/thrombolysis of graft, possible angioplasty Reviewed procedure and risks. Consent signed in chart  Ascencion Dike PA-C 07/02/2012, 11:28  AM

## 2012-07-02 NOTE — ED Notes (Signed)
Pt noticed some swelling in rt upper arm.  I held some pressure to the area and felt like pt had a hematoma.  Heather from IR came and applied additional pressure to site.  Hematoma no longer noticed.  Will continue to monitor pt.

## 2012-07-02 NOTE — ED Notes (Signed)
Pt returned to radiology waiting area pending transport to IR suite for intervention

## 2012-07-02 NOTE — ED Notes (Signed)
97.6

## 2012-07-02 NOTE — Procedures (Signed)
Successful RUE AVF declot with 57mm pta No comp Stable Full report in pacs Ready to use

## 2012-07-05 ENCOUNTER — Telehealth (HOSPITAL_COMMUNITY): Payer: Self-pay

## 2012-07-05 NOTE — Telephone Encounter (Signed)
Radiology follow up call:  Message box full, unable to leave message

## 2013-06-17 ENCOUNTER — Other Ambulatory Visit: Payer: Self-pay | Admitting: Nephrology

## 2013-06-17 ENCOUNTER — Ambulatory Visit
Admission: RE | Admit: 2013-06-17 | Discharge: 2013-06-17 | Disposition: A | Discharge status: Home / Self Care | Payer: BC Managed Care – HMO | Source: Ambulatory Visit | Attending: Nephrology | Admitting: Nephrology

## 2013-06-17 DIAGNOSIS — Z7184 Encounter for health counseling related to travel: Secondary | ICD-10-CM

## 2013-06-17 DIAGNOSIS — Z9289 Personal history of other medical treatment: Secondary | ICD-10-CM

## 2014-02-06 ENCOUNTER — Encounter (HOSPITAL_COMMUNITY): Payer: Self-pay | Admitting: Emergency Medicine

## 2014-02-06 ENCOUNTER — Emergency Department (HOSPITAL_COMMUNITY): Payer: BC Managed Care – HMO

## 2014-02-06 ENCOUNTER — Observation Stay (HOSPITAL_COMMUNITY)
Admission: EM | Admit: 2014-02-06 | Discharge: 2014-02-06 | Discharge status: Against Medical Advice | Payer: BC Managed Care – HMO | Attending: Internal Medicine | Admitting: Internal Medicine

## 2014-02-06 DIAGNOSIS — Z862 Personal history of diseases of the blood and blood-forming organs and certain disorders involving the immune mechanism: Secondary | ICD-10-CM

## 2014-02-06 DIAGNOSIS — R059 Cough, unspecified: Secondary | ICD-10-CM

## 2014-02-06 DIAGNOSIS — N186 End stage renal disease: Secondary | ICD-10-CM | POA: Diagnosis present

## 2014-02-06 DIAGNOSIS — N189 Chronic kidney disease, unspecified: Secondary | ICD-10-CM

## 2014-02-06 DIAGNOSIS — I120 Hypertensive chronic kidney disease with stage 5 chronic kidney disease or end stage renal disease: Secondary | ICD-10-CM | POA: Insufficient documentation

## 2014-02-06 DIAGNOSIS — J189 Pneumonia, unspecified organism: Principal | ICD-10-CM | POA: Insufficient documentation

## 2014-02-06 DIAGNOSIS — R05 Cough: Secondary | ICD-10-CM

## 2014-02-06 DIAGNOSIS — Z992 Dependence on renal dialysis: Secondary | ICD-10-CM | POA: Insufficient documentation

## 2014-02-06 DIAGNOSIS — N039 Chronic nephritic syndrome with unspecified morphologic changes: Secondary | ICD-10-CM

## 2014-02-06 DIAGNOSIS — I10 Essential (primary) hypertension: Secondary | ICD-10-CM | POA: Diagnosis present

## 2014-02-06 DIAGNOSIS — D631 Anemia in chronic kidney disease: Secondary | ICD-10-CM

## 2014-02-06 LAB — CBC WITH DIFFERENTIAL/PLATELET
BASOS ABS: 0 10*3/uL (ref 0.0–0.1)
BASOS PCT: 1 % (ref 0–1)
Eosinophils Absolute: 0.1 10*3/uL (ref 0.0–0.7)
Eosinophils Relative: 2 % (ref 0–5)
HEMATOCRIT: 29.8 % — AB (ref 39.0–52.0)
Hemoglobin: 9.3 g/dL — ABNORMAL LOW (ref 13.0–17.0)
LYMPHS PCT: 26 % (ref 12–46)
Lymphs Abs: 1.4 10*3/uL (ref 0.7–4.0)
MCH: 27.7 pg (ref 26.0–34.0)
MCHC: 31.2 g/dL (ref 30.0–36.0)
MCV: 88.7 fL (ref 78.0–100.0)
MONO ABS: 0.3 10*3/uL (ref 0.1–1.0)
Monocytes Relative: 6 % (ref 3–12)
NEUTROS ABS: 3.7 10*3/uL (ref 1.7–7.7)
NEUTROS PCT: 67 % (ref 43–77)
PLATELETS: 183 10*3/uL (ref 150–400)
RBC: 3.36 MIL/uL — ABNORMAL LOW (ref 4.22–5.81)
RDW: 14.1 % (ref 11.5–15.5)
WBC: 5.6 10*3/uL (ref 4.0–10.5)

## 2014-02-06 LAB — BASIC METABOLIC PANEL
BUN: 44 mg/dL — AB (ref 6–23)
CHLORIDE: 98 meq/L (ref 96–112)
CO2: 30 mEq/L (ref 19–32)
CREATININE: 12.31 mg/dL — AB (ref 0.50–1.35)
Calcium: 8.5 mg/dL (ref 8.4–10.5)
GFR, EST AFRICAN AMERICAN: 4 mL/min — AB (ref >90)
GFR, EST NON AFRICAN AMERICAN: 4 mL/min — AB (ref >90)
Glucose, Bld: 108 mg/dL — ABNORMAL HIGH (ref 70–99)
POTASSIUM: 5.2 meq/L (ref 3.7–5.3)
Sodium: 143 mEq/L (ref 137–147)

## 2014-02-06 LAB — MRSA PCR SCREENING: MRSA by PCR: NEGATIVE

## 2014-02-06 LAB — PRO B NATRIURETIC PEPTIDE: PRO B NATRI PEPTIDE: 29198 pg/mL — AB (ref 0–125)

## 2014-02-06 MED ORDER — NIFEDIPINE ER 60 MG PO TB24: 60.0000 mg | ORAL_TABLET | Freq: Every day | ORAL | Status: DC

## 2014-02-06 MED ORDER — DEXTROSE 5 % IV SOLN
1.0000 g | Freq: Once | INTRAVENOUS | Status: AC
  Administered 2014-02-06: 1 g via INTRAVENOUS
  Filled 2014-02-06: qty 10

## 2014-02-06 MED ORDER — CARVEDILOL 25 MG PO TABS: 25.0000 mg | ORAL_TABLET | Freq: Two times a day (BID) | ORAL | Status: DC

## 2014-02-06 MED ORDER — ACETAMINOPHEN 325 MG PO TABS: 650.0000 mg | ORAL_TABLET | Freq: Four times a day (QID) | ORAL | Status: DC | PRN

## 2014-02-06 MED ORDER — SODIUM CHLORIDE 0.9 % IJ SOLN: 3.0000 mL | Freq: Two times a day (BID) | INTRAMUSCULAR | Status: DC

## 2014-02-06 MED ORDER — TRAMADOL HCL 50 MG PO TABS: 50.0000 mg | ORAL_TABLET | Freq: Four times a day (QID) | ORAL | Status: DC | PRN

## 2014-02-06 MED ORDER — HYDRALAZINE HCL 25 MG PO TABS: 25.0000 mg | ORAL_TABLET | Freq: Three times a day (TID) | ORAL | Status: DC

## 2014-02-06 MED ORDER — ONDANSETRON HCL 4 MG/2ML IJ SOLN: 4.0000 mg | Freq: Four times a day (QID) | INTRAMUSCULAR | Status: DC | PRN

## 2014-02-06 MED ORDER — ACETAMINOPHEN 650 MG RE SUPP: 650.0000 mg | Freq: Four times a day (QID) | RECTAL | Status: DC | PRN

## 2014-02-06 MED ORDER — LEVOFLOXACIN IN D5W 750 MG/150ML IV SOLN: 750.0000 mg | Freq: Once | INTRAVENOUS | Status: DC

## 2014-02-06 MED ORDER — AZITHROMYCIN 250 MG PO TABS
500.0000 mg | ORAL_TABLET | Freq: Once | ORAL | Status: AC
  Administered 2014-02-06: 500 mg via ORAL
  Filled 2014-02-06: qty 2

## 2014-02-06 MED ORDER — ONDANSETRON HCL 4 MG PO TABS: 4.0000 mg | ORAL_TABLET | Freq: Four times a day (QID) | ORAL | Status: DC | PRN

## 2014-02-06 MED ORDER — CALCITRIOL 0.5 MCG PO CAPS: 0.5000 ug | ORAL_CAPSULE | Freq: Every day | ORAL | Status: DC

## 2014-02-06 MED ORDER — ENOXAPARIN SODIUM 30 MG/0.3ML ~~LOC~~ SOLN: 30.0000 mg | SUBCUTANEOUS | Status: DC

## 2014-02-06 MED ORDER — CALCIUM ACETATE 667 MG PO CAPS: 667.0000 mg | ORAL_CAPSULE | Freq: Three times a day (TID) | ORAL | Status: DC

## 2014-02-06 MED ORDER — OXYCODONE HCL 5 MG PO TABS: 5.0000 mg | ORAL_TABLET | Freq: Four times a day (QID) | ORAL | Status: DC | PRN

## 2014-02-06 NOTE — ED Notes (Addendum)
Pt alert, NAD, calm, interactive, resps e/u, speaking in clear complete sentences. No dyspnea noted. C/o throat irritation, worse when lying down, also cough. LS: Fine crackles noted. Denies pain. HD on MWF afternoons. R upper arm AV fistula +bruit/thrill.  Wife at [facility].

## 2014-02-06 NOTE — Progress Notes (Signed)
Patient stated that he wished to leave the hospital in order to get his dialysis outpatient.  Notified Dr. Dillard Essex and Dr. Jonnie Finner.  Dr. Dillard Essex spoke with patient as well as RN and patient stated that he wanted to leave anyway.  AMA papers signed.  Escorted patient to cab.

## 2014-02-06 NOTE — ED Notes (Signed)
Dr. Reather Converse into room.

## 2014-02-06 NOTE — Discharge Summary (Signed)
Physician Discharge Summary  [name] [id number] DOB: 01-24-1951 DOA: 02/06/2014  PCP: Scarlette Calico, MD  Admit date: 02/06/2014 Discharge date: 02/06/2014  Time spent: <30 minutes  Recommendations for Outpatient Follow-up:  Follow up with PCP, Nephrologist>> Pt LEFT AMA  Discharge Diagnoses:  Active Problems:   HYPERTENSION   End stage renal disease   HCAP (healthcare-associated pneumonia)   Anemia in chronic kidney disease   Discharge Condition: stable, left AMA  Diet recommendation: RENAL  There were no vitals filed for this visit.  History of present illness:  HPI: [name] is a 63 y.o. male past medical history as listed below including end-stage renal disease-MWF dialysis, hypertension who presents with above complaints. He relates that over the weekend he developed a worsening cough-productive of clear to sometimes dark brown sputum. He states that the cough is worse if he lies in bed with the head of the bed elevated, improves when he does sit upright on the side bed or a chair. He denies shortness of breath, fevers, chest pain and no leg swelling. He was seen in the ED and chest x-ray revealed very high s face disease which could reflect edema or multifocal pneumonia, WBC within normal limits at 5.6. He was started on empiric antibiotics with Rocephin and Zithromax and is admitted for further evaluation and management. He dialyzes at the [facility] in [place] and has not missed any dialysis sessions.   Hospital Course:  Present on Admission:  . HCAP (healthcare-associated pneumonia) versus pulmonary edema  - sputum and blood cultures were ordere as well as Empiric antibiotics with vancomycin and Zosyn  -renal was consulted for Dialysis patient was already on the floor from the ED, and following my evaluation he stated that his understanding was that he was just coming up to the dialysis unit to be dialyzed when he left the ED >> he states he does not  want to be admitted in the hospital and wants to sign his AMA papers to be discharged so that he can go for dialysis and his outpatient center.I spent time discussing his chest x-ray findings, the reasons for his admission and also the fact that nephrology was going to see him here and dialyze today, but he states that he wants to leave>> nursing staff informed to give him his AMA papers. He is alert and lucid and voiced understanding of the reason he had needed to be admitted from the ED. He left AMA  . HYPERTENSION  -Continue outpatient medications  . End stage renal disease  Renal was consulted on admission for dialysis- MWF     Procedures:  NONE  Consultations:  Renal  Discharge Exam: Filed Vitals:   02/06/14 0750  BP: 191/121  Pulse: 76  Temp: 98.6 F (37 C)  Resp: 20     Discharge Instructions You were cared for by a hospitalist during your hospital stay. If you have any questions about your discharge medications or the care you received while you were in the hospital after you are discharged, you can call the unit and asked to speak with the hospitalist on call if the hospitalist that took care of you is not available. Once you are discharged, your primary care physician will handle any further medical issues. Please note that NO REFILLS for any discharge medications will be authorized once you are discharged, as it is imperative that you return to your primary care physician (or establish a relationship with a primary care physician if you do  not have one) for your aftercare needs so that they can reassess your need for medications and monitor your lab values.     Medication List    ASK your doctor about these medications       calcitRIOL 0.5 MCG capsule  Commonly known as:  ROCALTROL  Take 0.5 mcg by mouth daily.     calcium acetate 667 MG capsule  Commonly known as:  PHOSLO  Take 667 mg by mouth 3 (three) times daily with meals.     carvedilol 25 MG tablet   Commonly known as:  COREG  Take 25 mg by mouth 2 (two) times daily with a meal.     furosemide 40 MG tablet  Commonly known as:  LASIX  Take 40 mg by mouth daily as needed. For fluid; not on dialysis days     hydrALAZINE 25 MG tablet  Commonly known as:  APRESOLINE  Take 25 mg by mouth 3 (three) times daily.     NIFEdipine 60 MG 24 hr tablet  Commonly known as:  PROCARDIA XL/ADALAT-CC  Take 60 mg by mouth daily.     oxyCODONE 5 MG immediate release tablet  Commonly known as:  Oxy IR/ROXICODONE  Take 5 mg by mouth every 6 (six) hours as needed for moderate pain.     RENA-VITE RX 1 MG Tabs  Take 1 mg by mouth daily.     traMADol 50 MG tablet  Commonly known as:  ULTRAM  Take 50 mg by mouth Daily.       Allergies  Allergen Reactions  . Hydrocodone Itching      The results of significant diagnostics from this hospitalization (including imaging, microbiology, ancillary and laboratory) are listed below for reference.    Significant Diagnostic Studies: Dg Chest 2 View  02/06/2014   CLINICAL DATA:  Cough.  End-stage renal disease.  EXAM: CHEST  2 VIEW  COMPARISON:  06/17/2013  FINDINGS: There is bilateral perihilar airspace opacity, slightly asymmetric to the right. Few Kerley B-lines are noted. There is mild cardiomegaly and chronic aortic tortuosity. No effusion. No pneumothorax.  IMPRESSION: Perihilar airspace disease which could reflect edema or multi focal pneumonia.   Electronically Signed   By: Jorje Guild M.D.   On: 02/06/2014 04:02    Microbiology: Recent Results (from the past 240 hour(s))  MRSA PCR SCREENING     Status: None   Collection Time    02/06/14  8:51 AM      Result Value Ref Range Status   MRSA by PCR NEGATIVE  NEGATIVE Final   Comment:            The GeneXpert MRSA Assay (FDA     approved for NASAL specimens     only), is one component of a     comprehensive MRSA colonization     surveillance program. It is not     intended to diagnose MRSA      infection nor to guide or     monitor treatment for     MRSA infections.     Labs: Basic Metabolic Panel:  Recent Labs Lab 02/06/14 0535  NA 143  K 5.2  CL 98  CO2 30  GLUCOSE 108*  BUN 44*  CREATININE 12.31*  CALCIUM 8.5   Liver Function Tests: No results found for this basename: AST, ALT, ALKPHOS, BILITOT, PROT, ALBUMIN,  in the last 168 hours No results found for this basename: LIPASE, AMYLASE,  in the last 168 hours No  results found for this basename: AMMONIA,  in the last 168 hours CBC:  Recent Labs Lab 02/06/14 0535  WBC 5.6  NEUTROABS 3.7  HGB 9.3*  HCT 29.8*  MCV 88.7  PLT 183   Cardiac Enzymes: No results found for this basename: CKTOTAL, CKMB, CKMBINDEX, TROPONINI,  in the last 168 hours BNP: BNP (last 3 results)  Recent Labs  02/06/14 0535  PROBNP 29198.0*   CBG: No results found for this basename: GLUCAP,  in the last 168 hours     Signed:  VIYUOH,ADELINE C  [facility] 02/06/2014, 12:03 PM

## 2014-02-06 NOTE — Progress Notes (Signed)
[facility] History and Physical  [name] [id number] DOB: 09/16/1950 DOA: 02/06/2014  Referring physician: EDP PCP: Scarlette Calico, MD   Chief Complaint: Persistent cough  HPI: [name] is a 63 y.o. male past medical history as listed below including end-stage renal disease-MWF dialysis, hypertension who presents with above complaints. He relates that over the weekend he developed a worsening cough-productive of clear to sometimes dark brown sputum. He states that the cough is worse if he lies in bed with the head of the bed elevated, improves when he does sit upright on the side bed or a chair. He denies shortness of breath, fevers, chest pain and no leg swelling. He was seen in the ED and chest x-ray revealed very high s face disease which could reflect edema or multifocal pneumonia, WBC within normal limits at 5.6. He was started on empiric antibiotics with Rocephin and Zithromax and is admitted for further evaluation and management. He dialyzes at the [facility] in [place] and has not missed any dialysis sessions.    Review of Systems The patient denies anorexia, fever, weight loss,, vision loss, decreased hearing, hoarseness, chest pain, syncope, dyspnea on exertion, peripheral edema, balance deficits, hemoptysis, abdominal pain, melena, hematochezia, severe indigestion/heartburn, hematuria, incontinence, genital sores, muscle weakness, suspicious skin lesions, transient blindness, difficulty walking, depression, unusual weight change.   Past Medical History  Diagnosis Date  . Headache(784.0)   . Hyperlipidemia   . HTN (hypertension)   . Renal failure   . Anemia   . 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 Gout   . Anemia   . Benign colon polyp 2012  . Clot     STATED TOLD IN APRIL CLOT TO LEFT SHOULDER. DOCTOR MADE AWARE  . PONV (postoperative nausea and vomiting)    Past Surgical History  Procedure Laterality Date  . Av  fistula placement, radiocephalic  123XX123    Right arm  . Insertion of dialysis catheter  01/19/2012    Procedure: INSERTION OF DIALYSIS CATHETER;  Surgeon: Conrad Shaver Lake, MD;  Location: [place];  Service: Vascular;  Laterality: N/A;  right internal jugular vein  . Av fistula placement  01/19/2012    Procedure: ARTERIOVENOUS (AV) FISTULA CREATION;  Surgeon: Conrad Ewing, MD;  Location: [facility] OR;  Service: Vascular;  Laterality: Right;  Right Brachio-cephalic arteriovenous fistula   Social History:  reports that he has never smoked. He has never used smokeless tobacco. He reports that he does not drink alcohol or use illicit drugs.  Allergies  Allergen Reactions  . Hydrocodone Itching    Family History  Problem Relation Age of Onset  . Diabetes Father   . Colon cancer Neg Hx   . Esophageal cancer Neg Hx   . Stomach cancer Neg Hx   . Rectal cancer Neg Hx      Prior to Admission medications   Medication Sig Start Date End Date Taking? Authorizing Provider  B Complex-C-Folic Acid (RENA-VITE RX) 1 MG TABS Take 1 mg by mouth daily.  12/30/11  Yes Historical Provider, MD  calcitRIOL (ROCALTROL) 0.5 MCG capsule Take 0.5 mcg by mouth daily.  10/12/11  Yes Historical Provider, MD  calcium acetate (PHOSLO) 667 MG capsule Take 667 mg by mouth 3 (three) times daily with meals.   Yes Historical Provider, MD  carvedilol (COREG) 25 MG tablet Take 25 mg by mouth 2 (two) times daily with a meal.   Yes Historical Provider, MD  furosemide (LASIX) 40 MG tablet Take 40 mg by mouth daily as needed. For fluid; not on dialysis days 03/05/11  Yes Historical Provider, MD  hydrALAZINE (APRESOLINE) 25 MG tablet Take 25 mg by mouth 3 (three) times daily.  12/25/11  Yes Historical Provider, MD  NIFEdipine (PROCARDIA XL/ADALAT-CC) 60 MG 24 hr tablet Take 60 mg by mouth daily.   Yes Historical Provider, MD  oxyCODONE (OXY IR/ROXICODONE) 5 MG immediate release tablet Take 5 mg by mouth every 6 (six) hours as needed for moderate  pain.  01/19/12  Yes Historical Provider, MD  traMADol (ULTRAM) 50 MG tablet Take 50 mg by mouth Daily.  01/07/12  Yes Historical Provider, MD   Physical Exam: Filed Vitals:   02/06/14 0750  BP: 191/121  Pulse: 76  Temp: 98.6 F (37 C)  Resp: 20    BP 191/121  Pulse 76  Temp(Src) 98.6 F (37 C) (Oral)  Resp 20  SpO2 91% Constitutional: Vital signs reviewed.  Patient is a well-developed and well-nourished  in no acute distress and cooperative with exam. Alert and oriented x3.  Head: Normocephalic and atraumatic Mouth: no erythema or exudates, MMM Eyes: PERRL, EOMI, conjunctivae normal, No scleral icterus.  Neck: Supple, Trachea midline normal ROM, No JVD, mass, thyromegaly, or carotid bruit present.  Cardiovascular: RRR, S1 normal, S2 normal, no MRG, pulses symmetric and intact bilaterally Pulmonary/Chest: normal respiratory effort,  scattered crackles, no wheezes Abdominal: Soft. Non-tender, non-distended, bowel sounds are normal, no masses, organomegaly, or guarding present.  GU: no CVA tenderness Neurological: A&O x3, Strength is normal and symmetric bilaterally, cranial nerve II-XII are grossly intact, no focal motor deficit, sensory intact to light touch bilaterally.  Skin: Warm, dry and intact. No rash, cyanosis, or clubbing.  Psychiatric: Normal mood and affect. speech and behavior is normal. Judgment and thought content normal. Cognition and memory are normal.                Labs on Admission:  Basic Metabolic Panel:  Recent Labs Lab 02/06/14 0535  NA 143  K 5.2  CL 98  CO2 30  GLUCOSE 108*  BUN 44*  CREATININE 12.31*  CALCIUM 8.5   Liver Function Tests: No results found for this basename: AST, ALT, ALKPHOS, BILITOT, PROT, ALBUMIN,  in the last 168 hours No results found for this basename: LIPASE, AMYLASE,  in the last 168 hours No results found for this basename: AMMONIA,  in the last 168 hours CBC:  Recent Labs Lab 02/06/14 0535  WBC 5.6   NEUTROABS 3.7  HGB 9.3*  HCT 29.8*  MCV 88.7  PLT 183   Cardiac Enzymes: No results found for this basename: CKTOTAL, CKMB, CKMBINDEX, TROPONINI,  in the last 168 hours  BNP (last 3 results)  Recent Labs  02/06/14 0535  PROBNP 29198.0*   CBG: No results found for this basename: GLUCAP,  in the last 168 hours  Radiological Exams on Admission: Dg Chest 2 View  02/06/2014   CLINICAL DATA:  Cough.  End-stage renal disease.  EXAM: CHEST  2 VIEW  COMPARISON:  06/17/2013  FINDINGS: There is bilateral perihilar airspace opacity, slightly asymmetric to the right. Few Kerley B-lines are noted. There is mild cardiomegaly and chronic aortic tortuosity. No effusion. No pneumothorax.  IMPRESSION: Perihilar airspace disease which could reflect edema or multi focal pneumonia.   Electronically Signed   By: Jorje Guild M.D.   On: 02/06/2014 04:02      Assessment/Plan Active Problems:  Present on  Admission:  . HCAP (healthcare-associated pneumonia) versus pulmonary edema -As discussed above, will obtain sputum and blood cultures  -Empiric antibiotics with vancomycin and Zosyn  -Dialysis for renal -Follow repeat chest x-ray in a.m. following dialysis  . HYPERTENSION -Continue outpatient medications  . End stage renal disease I have consulted renal for dialysis- MWF    addendum -patient was already on the floor from the ED, and following my evaluation she states that his understanding was that he was just coming up to the dialysis unit to be dialyzed when he left the ED >> he states he does not want to be admitted in the hospital and wants to sign his AMA papers to be discharged so that he can go for dialysis and his outpatient center.I spent time discussing his chest x-ray findings, the reasons for his admission and also the fact that nephrology was going to see him here and dialyze today, but he states that he wants to leave>> nursing staff informed to give him his AMA papers. He is alert  and lucid and voices understanding of the reason for his admission.   Code Status:full  Family Communication: none at bedside Disposition Plan: admit to tele  Time spent: >30  [facility] Pager [phone]

## 2014-02-06 NOTE — ED Provider Notes (Addendum)
CSN: [id number]     Arrival date & time 02/06/14  0229 History   First MD Initiated Contact with Patient 02/06/14 0449     Chief Complaint  Patient presents with  . Cough     (Consider location/radiation/quality/duration/timing/severity/associated sxs/prior Treatment) HPI Comments: 63 year old male with history of lipids, high blood pressure, renal failure on dialysis last dialyzed on Friday without difficulty presents with productive cough worsening the past 2 days. Patient had similar episode early in the week that resolved. Patient chills without fevers. No sick contacts or recent travel. No immunosuppression history. Patient not on oxygen at home. His wife was starting to develop similar symptoms. Symptoms intermittent patient denies heart failure heart attack history.  Patient is a 63 y.o. male presenting with cough. The history is provided by the patient.  Cough Associated symptoms: no chest pain, no chills, no fever, no headaches, no rash and no shortness of breath     Past Medical History  Diagnosis Date  . Headache(784.0)   . Hyperlipidemia   . HTN (hypertension)   . Renal failure   . Anemia   . 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 Gout   . Anemia   . Benign colon polyp 2012  . Clot     STATED TOLD IN APRIL CLOT TO LEFT SHOULDER. DOCTOR MADE AWARE  . PONV (postoperative nausea and vomiting)    Past Surgical History  Procedure Laterality Date  . Av fistula placement, radiocephalic  [id number]    Right arm  . Insertion of dialysis catheter  01/19/2012    Procedure: INSERTION OF DIALYSIS CATHETER;  Surgeon: Conrad Meadowbrook, MD;  Location: [facility];  Service: Vascular;  Laterality: N/A;  right internal jugular vein  . Av fistula placement  01/19/2012    Procedure: ARTERIOVENOUS (AV) FISTULA CREATION;  Surgeon: Conrad , MD;  Location: [facility];  Service: Vascular;  Laterality: Right;  Right Brachio-cephalic arteriovenous fistula   Family History   Problem Relation Age of Onset  . Diabetes Father   . Colon cancer Neg Hx   . Esophageal cancer Neg Hx   . Stomach cancer Neg Hx   . Rectal cancer Neg Hx    History  Substance Use Topics  . Smoking status: Never Smoker   . Smokeless tobacco: Never Used  . Alcohol Use: No     Comment: 1 drink a month or non    Review of Systems  Constitutional: Negative for fever and chills.  HENT: Negative for congestion.   Eyes: Negative for visual disturbance.  Respiratory: Positive for cough. Negative for shortness of breath.   Cardiovascular: Negative for chest pain and leg swelling.  Gastrointestinal: Negative for vomiting and abdominal pain.  Genitourinary: Negative for dysuria and flank pain.  Musculoskeletal: Negative for back pain, neck pain and neck stiffness.  Skin: Negative for rash.  Neurological: Negative for light-headedness and headaches.      Allergies  Hydrocodone  Home Medications   Prior to Admission medications   Medication Sig Start Date End Date Taking? Authorizing Provider  B Complex-C-Folic Acid (RENA-VITE RX) 1 MG TABS Take 1 mg by mouth daily.  12/30/11  Yes Historical Provider, MD  calcitRIOL (ROCALTROL) 0.5 MCG capsule Take 0.5 mcg by mouth daily.  10/12/11  Yes Historical Provider, MD  calcium acetate (PHOSLO) 667 MG capsule Take 667 mg by mouth 3 (three) times daily with meals.   Yes Historical Provider, MD  carvedilol (COREG) 25 MG  tablet Take 25 mg by mouth 2 (two) times daily with a meal.   Yes Historical Provider, MD  furosemide (LASIX) 40 MG tablet Take 40 mg by mouth daily as needed. For fluid; not on dialysis days 03/05/11  Yes Historical Provider, MD  hydrALAZINE (APRESOLINE) 25 MG tablet Take 25 mg by mouth 3 (three) times daily.  12/25/11  Yes Historical Provider, MD  NIFEdipine (PROCARDIA XL/ADALAT-CC) 60 MG 24 hr tablet Take 60 mg by mouth daily.   Yes Historical Provider, MD  oxyCODONE (OXY IR/ROXICODONE) 5 MG immediate release tablet Take 5 mg by  mouth every 6 (six) hours as needed for moderate pain.  01/19/12  Yes Historical Provider, MD  traMADol (ULTRAM) 50 MG tablet Take 50 mg by mouth Daily.  01/07/12  Yes Historical Provider, MD   BP 209/102  Pulse 80  Temp(Src) 98.3 F (36.8 C) (Oral)  Resp 18  SpO2 96% Physical Exam  Nursing note and vitals reviewed. Constitutional: He is oriented to person, place, and time. He appears well-developed and well-nourished.  HENT:  Head: Normocephalic and atraumatic.  Eyes: Conjunctivae are normal. Right eye exhibits no discharge. Left eye exhibits no discharge.  Neck: Normal range of motion. Neck supple. No tracheal deviation present.  Cardiovascular: Normal rate and regular rhythm.   Pulmonary/Chest: Effort normal. He has rales (crackles bilateral lower and middle lobes mild).  Abdominal: Soft. He exhibits no distension. There is no tenderness. There is no guarding.  Musculoskeletal: He exhibits no edema and no tenderness.  Neurological: He is alert and oriented to person, place, and time.  Skin: Skin is warm. No rash noted.  Psychiatric: He has a normal mood and affect.    ED Course  Procedures (including critical care time) Labs Review Labs Reviewed  CBC WITH DIFFERENTIAL - Abnormal; Notable for the following:    RBC 3.36 (*)    Hemoglobin 9.3 (*)    HCT 29.8 (*)    All other components within normal limits  PRO B NATRIURETIC PEPTIDE - Abnormal; Notable for the following:    Pro B Natriuretic peptide (BNP) 29198.0 (*)    All other components within normal limits  BASIC METABOLIC PANEL - Abnormal; Notable for the following:    Glucose, Bld 108 (*)    BUN 44 (*)    Creatinine, Ser 12.31 (*)    GFR calc non Af Amer 4 (*)    GFR calc Af Amer 4 (*)    All other components within normal limits    Imaging Review Dg Chest 2 View  02/06/2014   CLINICAL DATA:  Cough.  End-stage renal disease.  EXAM: CHEST  2 VIEW  COMPARISON:  06/17/2013  FINDINGS: There is bilateral perihilar  airspace opacity, slightly asymmetric to the right. Few Kerley B-lines are noted. There is mild cardiomegaly and chronic aortic tortuosity. No effusion. No pneumothorax.  IMPRESSION: Perihilar airspace disease which could reflect edema or multi focal pneumonia.   Electronically Signed   By: Jorje Guild M.D.   On: 02/06/2014 04:02     EKG Interpretation   Date/Time:  Monday February 06 2014 05:32:05 EDT Ventricular Rate:  75 PR Interval:  201 QRS Duration: 85 QT Interval:  468 QTC Calculation: 523 R Axis:   32 Text Interpretation:  Sinus rhythm Prolonged QT interval Confirmed by  ZAVITZ  MD, JOSHUA ([id number]) on 02/06/2014 6:30:03 AM      MDM   Final diagnoses:  Anemia in chronic kidney disease  Community acquired pneumonia  Cough  ESRD (end stage renal disease)  QT prolonged htn  Clinically differential pneumonia versus heart failure/pulmonary edema from end-stage renal disease. Patient noticed rest not requiring oxygen in ER. Blood pressure elevated [id number] systolic. Chest x-ray reviewed by myself and showed diffuse small infiltrates consistent with pneumonia versus edema. BNP significantly elevated which leans towards him or edema however patient does have a productive cough and chills. Required antibiotics ordered, labs reviewed showing worsening anemia without active bleeding. [facility]  hospitalist page for admission for dialysis and possible treatment for pneumonia. Discussed with [facility] hospitalist who agreed with observation for dialysis to see if chest x-ray improved in symptoms improved. Updated patient is comfortable the plan. The patients results and plan were reviewed and discussed.   Any x-rays performed were personally reviewed by myself.   Differential diagnosis were considered with the presenting HPI.  Medications  cefTRIAXone (ROCEPHIN) 1 g in dextrose 5 % 50 mL IVPB (1 g Intravenous New Bag/Given 02/06/14 0702)  azithromycin (ZITHROMAX) tablet 500 mg (500 mg Oral Given  02/06/14 0700)      Filed Vitals:   02/06/14 0500 02/06/14 0500 02/06/14 0515 02/06/14 0545  BP: 207/100  177/99 209/102  Pulse: 69  77 80  Temp:  98.3 F (36.8 C)    TempSrc:  Oral    Resp: 21  25 18   SpO2: 95%  94% 96%    Admission/ observation were discussed with the admitting physician, patient and/or family and they are comfortable with the plan.     Mariea Clonts, MD 02/06/14 [id number]  Mariea Clonts, MD 02/07/14 [phone]

## 2014-02-06 NOTE — ED Notes (Signed)
Dr. Reather Converse at [facility] speaking with & updating pt. No changes. No dyspnea. Cough continues.

## 2014-02-06 NOTE — ED Notes (Addendum)
Pt alert, NAD, calm, family at [facility], VSS, denies changes, c/o throat irritation, "something in my throat". Airway intact. No dyspnea.

## 2014-02-06 NOTE — ED Notes (Signed)
Pt. reports productive cough onset yesterday , denies fever or chills, hemodialysis q Mon/Wed/Fri .

## 2014-02-14 ENCOUNTER — Other Ambulatory Visit: Payer: Self-pay | Admitting: Nephrology

## 2014-02-14 ENCOUNTER — Ambulatory Visit
Admission: RE | Admit: 2014-02-14 | Discharge: 2014-02-14 | Disposition: A | Discharge status: Home / Self Care | Payer: BC Managed Care – HMO | Source: Ambulatory Visit | Attending: Nephrology | Admitting: Nephrology

## 2014-02-14 DIAGNOSIS — N186 End stage renal disease: Secondary | ICD-10-CM

## 2014-02-14 NOTE — H&P (Signed)
[facility] History and Physical   [name] [id number] DOB: Dec 10, 1950 DOA: 02/06/2014    PLEASE SEE THE ORIGINAL H&P DATED 02/06/14 MISLABELED AS A PROGRESS NOTE Referring physician: EDP PCP: Scarlette Calico, MD   Chief Complaint: Persistent cough  HPI: [name] is a 63 y.o. male past medical history as listed below including end-stage renal disease-MWF dialysis, hypertension who presents with above complaints. He relates that over the weekend he developed a worsening cough-productive of clear to sometimes dark brown sputum. He states that the cough is worse if he lies in bed with the head of the bed elevated, improves when he does sit upright on the side bed or a chair. He denies shortness of breath, fevers, chest pain and no leg swelling. He was seen in the ED and chest x-ray revealed very high s face disease which could reflect edema or multifocal pneumonia, WBC within normal limits at 5.6. He was started on empiric antibiotics with Rocephin and Zithromax and is admitted for further evaluation and management. He dialyzes at the [facility] in [place] and has not missed any dialysis sessions.    Review of Systems The patient denies anorexia, fever, weight loss,, vision loss, decreased hearing, hoarseness, chest pain, syncope, dyspnea on exertion, peripheral edema, balance deficits, hemoptysis, abdominal pain, melena, hematochezia, severe indigestion/heartburn, hematuria, incontinence, genital sores, muscle weakness, suspicious skin lesions, transient blindness, difficulty walking, depression, unusual weight change.   Past Medical History  Diagnosis Date  . Headache(784.0)   . Hyperlipidemia   . HTN (hypertension)   . Renal failure   . Anemia   . 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 Gout   . Anemia   . Benign colon polyp 2012  . Clot     STATED TOLD IN APRIL CLOT TO LEFT SHOULDER. DOCTOR MADE AWARE  . PONV (postoperative  nausea and vomiting)    Past Surgical History  Procedure Laterality Date  . Av fistula placement, radiocephalic  123XX123    Right arm  . Insertion of dialysis catheter  01/19/2012    Procedure: INSERTION OF DIALYSIS CATHETER;  Surgeon: Conrad [place], MD;  Location: [facility];  Service: Vascular;  Laterality: N/A;  right internal jugular vein  . Av fistula placement  01/19/2012    Procedure: ARTERIOVENOUS (AV) FISTULA CREATION;  Surgeon: Conrad [place], MD;  Location: [facility] OR;  Service: Vascular;  Laterality: Right;  Right Brachio-cephalic arteriovenous fistula   Social History:  reports that he has never smoked. He has never used smokeless tobacco. He reports that he does not drink alcohol or use illicit drugs.  Allergies  Allergen Reactions  . Hydrocodone Itching    Family History  Problem Relation Age of Onset  . Diabetes Father   . Colon cancer Neg Hx   . Esophageal cancer Neg Hx   . Stomach cancer Neg Hx   . Rectal cancer Neg Hx      Prior to Admission medications   Medication Sig Start Date End Date Taking? Authorizing Provider  B Complex-C-Folic Acid (RENA-VITE RX) 1 MG TABS Take 1 mg by mouth daily.  12/30/11  Yes Historical Provider, MD  calcitRIOL (ROCALTROL) 0.5 MCG capsule Take 0.5 mcg by mouth daily.  10/12/11  Yes Historical Provider, MD  calcium acetate (PHOSLO) 667 MG capsule Take 667 mg by mouth 3 (three) times daily with meals.   Yes Historical Provider, MD  carvedilol (COREG) 25 MG tablet Take 25 mg by  mouth 2 (two) times daily with a meal.   Yes Historical Provider, MD  furosemide (LASIX) 40 MG tablet Take 40 mg by mouth daily as needed. For fluid; not on dialysis days 03/05/11  Yes Historical Provider, MD  hydrALAZINE (APRESOLINE) 25 MG tablet Take 25 mg by mouth 3 (three) times daily.  12/25/11  Yes Historical Provider, MD  NIFEdipine (PROCARDIA XL/ADALAT-CC) 60 MG 24 hr tablet Take 60 mg by mouth daily.   Yes Historical Provider, MD  oxyCODONE (OXY IR/ROXICODONE) 5 MG  immediate release tablet Take 5 mg by mouth every 6 (six) hours as needed for moderate pain.  01/19/12  Yes Historical Provider, MD  traMADol (ULTRAM) 50 MG tablet Take 50 mg by mouth Daily.  01/07/12  Yes Historical Provider, MD   Physical Exam: Filed Vitals:   02/06/14 0750  BP: 191/121  Pulse: 76  Temp: 98.6 F (37 C)  Resp: 20    BP 191/121  Pulse 76  Temp(Src) 98.6 F (37 C) (Oral)  Resp 20  SpO2 91% Constitutional: Vital signs reviewed.  Patient is a well-developed and well-nourished  in no acute distress and cooperative with exam. Alert and oriented x3.  Head: Normocephalic and atraumatic Mouth: no erythema or exudates, MMM Eyes: PERRL, EOMI, conjunctivae normal, No scleral icterus.  Neck: Supple, Trachea midline normal ROM, No JVD, mass, thyromegaly, or carotid bruit present.  Cardiovascular: RRR, S1 normal, S2 normal, no MRG, pulses symmetric and intact bilaterally Pulmonary/Chest: normal respiratory effort,  scattered crackles, no wheezes Abdominal: Soft. Non-tender, non-distended, bowel sounds are normal, no masses, organomegaly, or guarding present.  GU: no CVA tenderness Neurological: A&O x3, Strength is normal and symmetric bilaterally, cranial nerve II-XII are grossly intact, no focal motor deficit, sensory intact to light touch bilaterally.  Skin: Warm, dry and intact. No rash, cyanosis, or clubbing.  Psychiatric: Normal mood and affect. speech and behavior is normal. Judgment and thought content normal. Cognition and memory are normal.                Labs on Admission:  Basic Metabolic Panel: No results found for this basename: NA, K, CL, CO2, GLUCOSE, BUN, CREATININE, CALCIUM, MG, PHOS,  in the last 168 hours Liver Function Tests: No results found for this basename: AST, ALT, ALKPHOS, BILITOT, PROT, ALBUMIN,  in the last 168 hours No results found for this basename: LIPASE, AMYLASE,  in the last 168 hours No results found for this basename: AMMONIA,  in  the last 168 hours CBC: No results found for this basename: WBC, NEUTROABS, HGB, HCT, MCV, PLT,  in the last 168 hours Cardiac Enzymes: No results found for this basename: CKTOTAL, CKMB, CKMBINDEX, TROPONINI,  in the last 168 hours  BNP (last 3 results)  Recent Labs  02/06/14 0535  PROBNP 29198.0*   CBG: No results found for this basename: GLUCAP,  in the last 168 hours  Radiological Exams on Admission: Dg Chest 2 View  02/14/2014   CLINICAL DATA:  End-stage renal disease, evaluate for pulmonary edema, the patient is on dialysis  EXAM: CHEST  2 VIEW  COMPARISON:  Chest x-ray of 02/06/2014  FINDINGS: Moderate cardiomegaly remains. There has been improvement in airspace disease although perihilar vague opacity remains most consistent with mild edema. There do appear to be tiny effusions bilaterally. No bony abnormality is seen.  IMPRESSION: Cardiomegaly with mild edema and small effusions. Improvement in perihilar edema since the prior chest x-ray.   Electronically Signed   By: Ivar Drape  M.D.   On: 02/14/2014 12:50      Assessment/Plan Active Problems:  Present on Admission:  . HCAP (healthcare-associated pneumonia) versus pulmonary edema -As discussed above, will obtain sputum and blood cultures  -Empiric antibiotics with vancomycin and Zosyn  -Dialysis for renal -Follow repeat chest x-ray in a.m. following dialysis  . HYPERTENSION -Continue outpatient medications  . End stage renal disease I have consulted renal for dialysis- MWF    addendum -patient was already on the floor from the ED, and following my evaluation she states that his understanding was that he was just coming up to the dialysis unit to be dialyzed when he left the ED >> he states he does not want to be admitted in the hospital and wants to sign his AMA papers to be discharged so that he can go for dialysis and his outpatient center.I spent time discussing his chest x-ray findings, the reasons for his admission  and also the fact that nephrology was going to see him here and dialyze today, but he states that he wants to leave>> nursing staff informed to give him his AMA papers. He is alert and lucid and voices understanding of the reason for his admission.   Code Status:full  Family Communication: none at bedside Disposition Plan: admit to tele  Time spent: >30  [facility] Pager [phone]

## 2014-02-14 NOTE — Progress Notes (Deleted)
[facility] History and Physical   [name] [id number] DOB: 1951-03-23 DOA: 02/06/2014    PLEASE SEE THE ORIGINAL H&P DATED 02/06/14 MISLABELED AS A PROGRESS NOTE Referring physician: EDP PCP: Scarlette Calico, MD   Chief Complaint: Persistent cough  HPI: [name] is a 63 y.o. male past medical history as listed below including end-stage renal disease-MWF dialysis, hypertension who presents with above complaints. He relates that over the weekend he developed a worsening cough-productive of clear to sometimes dark brown sputum. He states that the cough is worse if he lies in bed with the head of the bed elevated, improves when he does sit upright on the side bed or a chair. He denies shortness of breath, fevers, chest pain and no leg swelling. He was seen in the ED and chest x-ray revealed very high s face disease which could reflect edema or multifocal pneumonia, WBC within normal limits at 5.6. He was started on empiric antibiotics with Rocephin and Zithromax and is admitted for further evaluation and management. He dialyzes at the [facility] in [place] and has not missed any dialysis sessions.    Review of Systems The patient denies anorexia, fever, weight loss,, vision loss, decreased hearing, hoarseness, chest pain, syncope, dyspnea on exertion, peripheral edema, balance deficits, hemoptysis, abdominal pain, melena, hematochezia, severe indigestion/heartburn, hematuria, incontinence, genital sores, muscle weakness, suspicious skin lesions, transient blindness, difficulty walking, depression, unusual weight change.   Past Medical History  Diagnosis Date  . Headache(784.0)   . Hyperlipidemia   . HTN (hypertension)   . Renal failure   . Anemia   . 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 Gout   . Anemia   . Benign colon polyp 2012  . Clot     STATED TOLD IN APRIL CLOT TO LEFT SHOULDER. DOCTOR MADE AWARE  . PONV (postoperative  nausea and vomiting)    Past Surgical History  Procedure Laterality Date  . Av fistula placement, radiocephalic  123XX123    Right arm  . Insertion of dialysis catheter  01/19/2012    Procedure: INSERTION OF DIALYSIS CATHETER;  Surgeon: Conrad Heard, MD;  Location: [facility];  Service: Vascular;  Laterality: N/A;  right internal jugular vein  . Av fistula placement  01/19/2012    Procedure: ARTERIOVENOUS (AV) FISTULA CREATION;  Surgeon: Conrad [place], MD;  Location: [facility] OR;  Service: Vascular;  Laterality: Right;  Right Brachio-cephalic arteriovenous fistula   Social History:  reports that he has never smoked. He has never used smokeless tobacco. He reports that he does not drink alcohol or use illicit drugs.  Allergies  Allergen Reactions  . Hydrocodone Itching    Family History  Problem Relation Age of Onset  . Diabetes Father   . Colon cancer Neg Hx   . Esophageal cancer Neg Hx   . Stomach cancer Neg Hx   . Rectal cancer Neg Hx      Prior to Admission medications   Medication Sig Start Date End Date Taking? Authorizing Provider  B Complex-C-Folic Acid (RENA-VITE RX) 1 MG TABS Take 1 mg by mouth daily.  12/30/11  Yes Historical Provider, MD  calcitRIOL (ROCALTROL) 0.5 MCG capsule Take 0.5 mcg by mouth daily.  10/12/11  Yes Historical Provider, MD  calcium acetate (PHOSLO) 667 MG capsule Take 667 mg by mouth 3 (three) times daily with meals.   Yes Historical Provider, MD  carvedilol (COREG) 25 MG tablet Take 25 mg by  mouth 2 (two) times daily with a meal.   Yes Historical Provider, MD  furosemide (LASIX) 40 MG tablet Take 40 mg by mouth daily as needed. For fluid; not on dialysis days 03/05/11  Yes Historical Provider, MD  hydrALAZINE (APRESOLINE) 25 MG tablet Take 25 mg by mouth 3 (three) times daily.  12/25/11  Yes Historical Provider, MD  NIFEdipine (PROCARDIA XL/ADALAT-CC) 60 MG 24 hr tablet Take 60 mg by mouth daily.   Yes Historical Provider, MD  oxyCODONE (OXY IR/ROXICODONE) 5 MG  immediate release tablet Take 5 mg by mouth every 6 (six) hours as needed for moderate pain.  01/19/12  Yes Historical Provider, MD  traMADol (ULTRAM) 50 MG tablet Take 50 mg by mouth Daily.  01/07/12  Yes Historical Provider, MD   Physical Exam: Filed Vitals:   02/06/14 0750  BP: 191/121  Pulse: 76  Temp: 98.6 F (37 C)  Resp: 20    BP 191/121  Pulse 76  Temp(Src) 98.6 F (37 C) (Oral)  Resp 20  SpO2 91% Constitutional: Vital signs reviewed.  Patient is a well-developed and well-nourished  in no acute distress and cooperative with exam. Alert and oriented x3.  Head: Normocephalic and atraumatic Mouth: no erythema or exudates, MMM Eyes: PERRL, EOMI, conjunctivae normal, No scleral icterus.  Neck: Supple, Trachea midline normal ROM, No JVD, mass, thyromegaly, or carotid bruit present.  Cardiovascular: RRR, S1 normal, S2 normal, no MRG, pulses symmetric and intact bilaterally Pulmonary/Chest: normal respiratory effort,  scattered crackles, no wheezes Abdominal: Soft. Non-tender, non-distended, bowel sounds are normal, no masses, organomegaly, or guarding present.  GU: no CVA tenderness Neurological: A&O x3, Strength is normal and symmetric bilaterally, cranial nerve II-XII are grossly intact, no focal motor deficit, sensory intact to light touch bilaterally.  Skin: Warm, dry and intact. No rash, cyanosis, or clubbing.  Psychiatric: Normal mood and affect. speech and behavior is normal. Judgment and thought content normal. Cognition and memory are normal.                Labs on Admission:  Basic Metabolic Panel: No results found for this basename: NA, K, CL, CO2, GLUCOSE, BUN, CREATININE, CALCIUM, MG, PHOS,  in the last 168 hours Liver Function Tests: No results found for this basename: AST, ALT, ALKPHOS, BILITOT, PROT, ALBUMIN,  in the last 168 hours No results found for this basename: LIPASE, AMYLASE,  in the last 168 hours No results found for this basename: AMMONIA,  in  the last 168 hours CBC: No results found for this basename: WBC, NEUTROABS, HGB, HCT, MCV, PLT,  in the last 168 hours Cardiac Enzymes: No results found for this basename: CKTOTAL, CKMB, CKMBINDEX, TROPONINI,  in the last 168 hours  BNP (last 3 results)  Recent Labs  02/06/14 0535  PROBNP 29198.0*   CBG: No results found for this basename: GLUCAP,  in the last 168 hours  Radiological Exams on Admission: Dg Chest 2 View  02/14/2014   CLINICAL DATA:  End-stage renal disease, evaluate for pulmonary edema, the patient is on dialysis  EXAM: CHEST  2 VIEW  COMPARISON:  Chest x-ray of 02/06/2014  FINDINGS: Moderate cardiomegaly remains. There has been improvement in airspace disease although perihilar vague opacity remains most consistent with mild edema. There do appear to be tiny effusions bilaterally. No bony abnormality is seen.  IMPRESSION: Cardiomegaly with mild edema and small effusions. Improvement in perihilar edema since the prior chest x-ray.   Electronically Signed   By: Ivar Drape  M.D.   On: 02/14/2014 12:50      Assessment/Plan Active Problems:  Present on Admission:  . HCAP (healthcare-associated pneumonia) versus pulmonary edema -As discussed above, will obtain sputum and blood cultures  -Empiric antibiotics with vancomycin and Zosyn  -Dialysis for renal -Follow repeat chest x-ray in a.m. following dialysis  . HYPERTENSION -Continue outpatient medications  . End stage renal disease I have consulted renal for dialysis- MWF    addendum -patient was already on the floor from the ED, and following my evaluation she states that his understanding was that he was just coming up to the dialysis unit to be dialyzed when he left the ED >> he states he does not want to be admitted in the hospital and wants to sign his AMA papers to be discharged so that he can go for dialysis and his outpatient center.I spent time discussing his chest x-ray findings, the reasons for his admission  and also the fact that nephrology was going to see him here and dialyze today, but he states that he wants to leave>> nursing staff informed to give him his AMA papers. He is alert and lucid and voices understanding of the reason for his admission.   Code Status:full  Family Communication: none at bedside Disposition Plan: admit to tele  Time spent: >30  [facility] Pager [phone]

## 2014-03-15 ENCOUNTER — Other Ambulatory Visit: Payer: Self-pay | Admitting: Nephrology

## 2014-03-15 ENCOUNTER — Ambulatory Visit
Admission: RE | Admit: 2014-03-15 | Discharge: 2014-03-15 | Disposition: A | Discharge status: Home / Self Care | Payer: BC Managed Care – HMO | Source: Ambulatory Visit | Attending: Nephrology | Admitting: Nephrology

## 2014-03-15 DIAGNOSIS — R7611 Nonspecific reaction to tuberculin skin test without active tuberculosis: Secondary | ICD-10-CM

## 2014-04-30 HISTORY — PX: TRANSPLANTATION RENAL: SUR1385

## 2014-06-09 ENCOUNTER — Other Ambulatory Visit: Payer: Self-pay | Admitting: Nurse Practitioner

## 2014-06-28 ENCOUNTER — Encounter (HOSPITAL_COMMUNITY): Payer: Self-pay | Admitting: Emergency Medicine

## 2014-06-28 ENCOUNTER — Emergency Department (HOSPITAL_COMMUNITY)
Admission: EM | Admit: 2014-06-28 | Discharge: 2014-06-28 | Disposition: A | Discharge status: Home / Self Care | Payer: BC Managed Care – HMO | Attending: Emergency Medicine | Admitting: Emergency Medicine

## 2014-06-28 DIAGNOSIS — Z8739 Personal history of other diseases of the musculoskeletal system and connective tissue: Secondary | ICD-10-CM | POA: Diagnosis not present

## 2014-06-28 DIAGNOSIS — N19 Unspecified kidney failure: Secondary | ICD-10-CM | POA: Insufficient documentation

## 2014-06-28 DIAGNOSIS — Z862 Personal history of diseases of the blood and blood-forming organs and certain disorders involving the immune mechanism: Secondary | ICD-10-CM | POA: Insufficient documentation

## 2014-06-28 DIAGNOSIS — E209 Hypoparathyroidism, unspecified: Secondary | ICD-10-CM | POA: Insufficient documentation

## 2014-06-28 DIAGNOSIS — Z86010 Personal history of colonic polyps: Secondary | ICD-10-CM | POA: Diagnosis not present

## 2014-06-28 DIAGNOSIS — Z79899 Other long term (current) drug therapy: Secondary | ICD-10-CM | POA: Insufficient documentation

## 2014-06-28 DIAGNOSIS — I10 Essential (primary) hypertension: Secondary | ICD-10-CM | POA: Insufficient documentation

## 2014-06-28 DIAGNOSIS — Z940 Kidney transplant status: Secondary | ICD-10-CM | POA: Diagnosis not present

## 2014-06-28 DIAGNOSIS — R040 Epistaxis: Secondary | ICD-10-CM | POA: Diagnosis not present

## 2014-06-28 LAB — BASIC METABOLIC PANEL WITH GFR
Anion gap: 13 (ref 5–15)
BUN: 33 mg/dL — ABNORMAL HIGH (ref 6–23)
CO2: 22 meq/L (ref 19–32)
Calcium: 9.6 mg/dL (ref 8.4–10.5)
Chloride: 104 meq/L (ref 96–112)
Creatinine, Ser: 3.1 mg/dL — ABNORMAL HIGH (ref 0.50–1.35)
GFR calc Af Amer: 23 mL/min — ABNORMAL LOW
GFR calc non Af Amer: 20 mL/min — ABNORMAL LOW
Glucose, Bld: 139 mg/dL — ABNORMAL HIGH (ref 70–99)
Potassium: 3.5 meq/L — ABNORMAL LOW (ref 3.7–5.3)
Sodium: 139 meq/L (ref 137–147)

## 2014-06-28 LAB — CBC WITH DIFFERENTIAL/PLATELET
Basophils Absolute: 0 10*3/uL (ref 0.0–0.1)
Basophils Relative: 1 % (ref 0–1)
EOS ABS: 0.1 10*3/uL (ref 0.0–0.7)
Eosinophils Relative: 3 % (ref 0–5)
HCT: 28.3 % — ABNORMAL LOW (ref 39.0–52.0)
HEMOGLOBIN: 9.2 g/dL — AB (ref 13.0–17.0)
LYMPHS ABS: 0.2 10*3/uL — AB (ref 0.7–4.0)
Lymphocytes Relative: 7 % — ABNORMAL LOW (ref 12–46)
MCH: 26.6 pg (ref 26.0–34.0)
MCHC: 32.5 g/dL (ref 30.0–36.0)
MCV: 81.8 fL (ref 78.0–100.0)
MONOS PCT: 4 % (ref 3–12)
Monocytes Absolute: 0.1 10*3/uL (ref 0.1–1.0)
Neutro Abs: 1.9 10*3/uL (ref 1.7–7.7)
Neutrophils Relative %: 85 % — ABNORMAL HIGH (ref 43–77)
Platelets: 194 10*3/uL (ref 150–400)
RBC: 3.46 MIL/uL — ABNORMAL LOW (ref 4.22–5.81)
RDW: 14.1 % (ref 11.5–15.5)
WBC: 2.2 10*3/uL — AB (ref 4.0–10.5)

## 2014-06-28 LAB — PROTIME-INR
INR: 1.08 (ref 0.00–1.49)
Prothrombin Time: 14.1 s (ref 11.6–15.2)

## 2014-06-28 MED ORDER — ONDANSETRON HCL 4 MG/2ML IJ SOLN
4.0000 mg | Freq: Once | INTRAMUSCULAR | Status: AC
  Administered 2014-06-28: 4 mg via INTRAVENOUS

## 2014-06-28 MED ORDER — SULFAMETHOXAZOLE-TRIMETHOPRIM 800-160 MG PO TABS: 1.0000 | ORAL_TABLET | Freq: Two times a day (BID) | ORAL | Status: DC

## 2014-06-28 MED ORDER — ONDANSETRON HCL 4 MG/2ML IJ SOLN
INTRAMUSCULAR | Status: AC
  Administered 2014-06-28: 4 mg via INTRAVENOUS
  Filled 2014-06-28: qty 2

## 2014-06-28 NOTE — ED Notes (Signed)
Pt presents with a nose bleed onset 11pm- upon arrival to exam room pt noted to have bleeding from left nare- pt admits that bleeding has been consistent since onset.  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  Pt states that for the past month approximately every 2 days he has had a nose bleed that usually subsides with pressure.  No respiratory distress noted, Dr. Dina Rich at bedside upon pt arrival.

## 2014-06-28 NOTE — ED Provider Notes (Signed)
CSN: [id number]     Arrival date & time 06/28/14  0033 History   First MD Initiated Contact with Patient 06/28/14 0051     Chief Complaint  Patient presents with  . Epistaxis     (Consider location/radiation/quality/duration/timing/severity/associated sxs/prior Treatment) HPI  This is a 63 year old male with recent history of kidney transplant in September 2015 followed at [facility] who presents with epistaxis. Patient reports he has had 1-2 nosebleeds weekly for the last 4 weeks. He has been using Afrin at home. He had onset of epistaxis at 11 PM. He was noted to have active brisk bleeding in triage and was brought back immediately. Patient is not on any blood thinners. He is immunosuppressed.  Level V caveat for acuity of condition  Past Medical History  Diagnosis Date  . Headache(784.0)   . Hyperlipidemia   . HTN (hypertension)   . Renal failure   . Anemia   . 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 Gout   . Anemia   . Benign colon polyp 2012  . Clot     STATED TOLD IN APRIL CLOT TO LEFT SHOULDER. DOCTOR MADE AWARE  . PONV (postoperative nausea and vomiting)    Past Surgical History  Procedure Laterality Date  . Av fistula placement, radiocephalic  123XX123    Right arm  . Insertion of dialysis catheter  01/19/2012    Procedure: INSERTION OF DIALYSIS CATHETER;  Surgeon: Conrad Worth, MD;  Location: [facility];  Service: Vascular;  Laterality: N/A;  right internal jugular vein  . Av fistula placement  01/19/2012    Procedure: ARTERIOVENOUS (AV) FISTULA CREATION;  Surgeon: Conrad [place], MD;  Location: [facility];  Service: Vascular;  Laterality: Right;  Right Brachio-cephalic arteriovenous fistula   Family History  Problem Relation Age of Onset  . Diabetes Father   . Colon cancer Neg Hx   . Esophageal cancer Neg Hx   . Stomach cancer Neg Hx   . Rectal cancer Neg Hx    History  Substance Use Topics  . Smoking status: Never Smoker   . Smokeless  tobacco: Never Used  . Alcohol Use: No     Comment: 1 drink a month or non    Review of Systems  Constitutional: Negative.  Negative for fever.  HENT: Positive for nosebleeds.   Respiratory: Negative.  Negative for chest tightness and shortness of breath.   Cardiovascular: Negative.  Negative for chest pain.  Gastrointestinal: Negative.  Negative for abdominal pain.  Genitourinary: Negative.  Negative for dysuria.  All other systems reviewed and are negative.     Allergies  Hydrocodone  Home Medications   Prior to Admission medications   Medication Sig Start Date End Date Taking? Authorizing Provider  carvedilol (COREG) 25 MG tablet Take 37.5 mg by mouth 2 (two) times daily with a meal.    Yes Historical Provider, MD  magnesium oxide (MAG-OX) 400 MG tablet Take 400 mg by mouth daily.   Yes Historical Provider, MD  mycophenolate (MYFORTIC) 180 MG EC tablet Take 540 mg by mouth 2 (two) times daily.   Yes Historical Provider, MD  omeprazole (PRILOSEC) 20 MG capsule Take 20 mg by mouth daily.   Yes Historical Provider, MD  phosphorus (K PHOS NEUTRAL) 155-852-130 MG tablet Take 250 mg by mouth 3 (three) times daily.   Yes Historical Provider, MD  tacrolimus (PROGRAF) 1 MG capsule Take 6-7 mg by mouth 2 (two) times daily. Take  7mg  in the morning and 6mg  in the evening   Yes Historical Provider, MD  traMADol (ULTRAM) 50 MG tablet Take 50 mg by mouth every 6 (six) hours as needed for moderate pain.  01/07/12  Yes Historical Provider, MD  valGANciclovir (VALCYTE) 450 MG tablet Take 450 mg by mouth 3 (three) times a week. Monday, Wednesday and Friday   Yes Historical Provider, MD  B Complex-C-Folic Acid (RENA-VITE RX) 1 MG TABS Take 1 mg by mouth daily.  12/30/11   Historical Provider, MD  calcitRIOL (ROCALTROL) 0.5 MCG capsule Take 0.5 mcg by mouth daily.  10/12/11   Historical Provider, MD  calcium acetate (PHOSLO) 667 MG capsule Take 667 mg by mouth 3 (three) times daily with meals.     Historical Provider, MD  furosemide (LASIX) 40 MG tablet Take 40 mg by mouth daily as needed. For fluid; not on dialysis days 03/05/11   Historical Provider, MD  hydrALAZINE (APRESOLINE) 25 MG tablet Take 25 mg by mouth 3 (three) times daily.  12/25/11   Historical Provider, MD  NIFEdipine (PROCARDIA XL/ADALAT-CC) 60 MG 24 hr tablet Take 60 mg by mouth daily.    Historical Provider, MD  oxyCODONE (OXY IR/ROXICODONE) 5 MG immediate release tablet Take 5 mg by mouth every 6 (six) hours as needed for moderate pain.  01/19/12   Historical Provider, MD  sulfamethoxazole-trimethoprim (SEPTRA DS) 800-160 MG per tablet Take 1 tablet by mouth every 12 (twelve) hours. 06/28/14   Merryl Hacker, MD   BP 172/79 mmHg  Pulse 71  Temp(Src) 98.2 F (36.8 C) (Oral)  Resp 22  Ht 5\' 10"  (1.778 m)  Wt 185 lb 3 oz (84 kg)  BMI 26.57 kg/m2  SpO2 99% Physical Exam  Constitutional: He is oriented to person, place, and time.  Actively bleeding  HENT:  Head: Normocephalic and atraumatic.  Brisk bleeding noted from the right nare  Eyes: Pupils are equal, round, and reactive to light.  Neck: Neck supple.  Cardiovascular: Normal rate, regular rhythm and normal heart sounds.   No murmur heard. Pulmonary/Chest: Effort normal and breath sounds normal. No respiratory distress. He has no wheezes.  Abdominal: Soft. Bowel sounds are normal. There is no tenderness. There is no rebound.  Musculoskeletal:  RUE fistula  Neurological: He is alert and oriented to person, place, and time.  Skin: Skin is warm and dry.  Psychiatric: He has a normal mood and affect.  Nursing note and vitals reviewed.   ED Course  EPISTAXIS MANAGEMENT Date/Time: 06/28/2014 2:59 AM Performed by: Thayer Jew, F Authorized by: Thayer Jew, F Consent: Verbal consent obtained. Consent given by: patient Patient sedated: no Treatment site: left anterior Repair method: anterior pack Post-procedure assessment: bleeding  stopped Treatment complexity: simple Patient tolerance: Patient tolerated the procedure well with no immediate complications   (including critical care time) Labs Review Labs Reviewed  CBC WITH DIFFERENTIAL - Abnormal; Notable for the following:    WBC 2.2 (*)    RBC 3.46 (*)    Hemoglobin 9.2 (*)    HCT 28.3 (*)    Neutrophils Relative % 85 (*)    Lymphocytes Relative 7 (*)    Lymphs Abs 0.2 (*)    All other components within normal limits  BASIC METABOLIC PANEL - Abnormal; Notable for the following:    Potassium 3.5 (*)    Glucose, Bld 139 (*)    BUN 33 (*)    Creatinine, Ser 3.10 (*)    GFR calc non Af  Amer 20 (*)    GFR calc Af Amer 23 (*)    All other components within normal limits  PROTIME-INR    Imaging Review No results found.   EKG Interpretation None      MDM   Final diagnoses:  Epistaxis    Patient presents with epistaxis. On initial evaluation, patient briskly bleeding. Had episode of large-volume vomitus with clot noted. For this reason, left anterior nare was packed with Rhino Rocket and neosynephrine sprayed in nare. Bleeding now controlled. Patient has a history of thrombocytopenia.  Lab work with hemoglobin of 9.2 which is at the patient's baseline, platelets 194, and normal INR. Creatinine is 3.1 which is comparable to baseline noted from [facility]. Patient was observed for approximately 3 hours without recurrence of bleeding. He's currently on Bactrim 3 times a week given his immunosuppression. Now that he has packing in his nare, will placed on Bactrim twice a day while packing is in place to cover for infection. He is to contact his transplant office tomorrow and was also given ENT follow-up. Patient was given strict return precautions.  After history, exam, and medical workup I feel the patient has been appropriately medically screened and is safe for discharge home. Pertinent diagnoses were discussed with the patient. Patient  was given return precautions.   Merryl Hacker, MD 06/28/14 [phone]

## 2014-06-28 NOTE — Discharge Instructions (Signed)
You were seen today for a nosebleed.  Your nosebleed required packing. Your lab work is reassuring at this time.  You will be placed on antibiotics twice daily until packing is related.  You need to follow-up with both ENT and her transplant doctors. See return precautions below.  Nosebleed Nosebleeds can be caused by many conditions, including trauma, infections, polyps, foreign bodies, dry mucous membranes or climate, medicines, and air conditioning. Most nosebleeds occur in the front of the nose. Because of this location, most nosebleeds can be controlled by pinching the nostrils gently and continuously for at least 10 to 20 minutes. The long, continuous pressure allows enough time for the blood to clot. If pressure is released during that 10 to 20 minute time period, the process may have to be started again. The nosebleed may stop by itself or quit with pressure, or it may need concentrated heating (cautery) or pressure from packing. HOME CARE INSTRUCTIONS   If your nose was packed, try to maintain the pack inside until your health care provider removes it. If a gauze pack was used and it starts to fall out, gently replace it or cut the end off. Do not cut if a balloon catheter was used to pack the nose. Otherwise, do not remove unless instructed.  Avoid blowing your nose for 12 hours after treatment. This could dislodge the pack or clot and start the bleeding again.  If the bleeding starts again, sit up and bend forward, gently pinching the front half of your nose continuously for 20 minutes.  If bleeding was caused by dry mucous membranes, use over-the-counter saline nasal spray or gel. This will keep the mucous membranes moist and allow them to heal. If you must use a lubricant, choose the water-soluble variety. Use it only sparingly and not within several hours of lying down.  Do not use petroleum jelly or mineral oil, as these may drip into the lungs and cause serious problems.  Maintain  humidity in your home by using less air conditioning or by using a humidifier.  Do not use aspirin or medicines which make bleeding more likely. Your health care provider can give you recommendations on this.  Resume normal activities as you are able, but try to avoid straining, lifting, or bending at the waist for several days.  If the nosebleeds become recurrent and the cause is unknown, your health care provider may suggest laboratory tests. SEEK MEDICAL CARE IF: You have a fever. SEEK IMMEDIATE MEDICAL CARE IF:   Bleeding recurs and cannot be controlled.  There is unusual bleeding from or bruising on other parts of the body.  Nosebleeds continue.  There is any worsening of the condition which originally brought you in.  You become light-headed, feel faint, become sweaty, or vomit blood. MAKE SURE YOU:   Understand these instructions.  Will watch your condition.  Will get help right away if you are not doing well or get worse. Document Released: 05/07/2005 Document Revised: 12/12/2013 Document Reviewed: 06/28/2009 [facility] Patient Information 2015 [place], [place]. This information is not intended to replace advice given to you by your health care provider. Make sure you discuss any questions you have with your health care provider.

## 2014-06-28 NOTE — ED Notes (Signed)
Packing placed in left nare by Dr. Dina Rich- pressure applied to bridge of nose by staff.  No respiratory distress noted.

## 2014-06-28 NOTE — ED Notes (Signed)
Pt vomitted large amount of dark red blood with clots in room- Dr. Dina Rich present.  Verbal order for 4mg  of Zofran given to pt.

## 2014-08-14 DIAGNOSIS — Z940 Kidney transplant status: Secondary | ICD-10-CM | POA: Diagnosis not present

## 2014-08-21 DIAGNOSIS — Z940 Kidney transplant status: Secondary | ICD-10-CM | POA: Diagnosis not present

## 2014-09-04 DIAGNOSIS — D899 Disorder involving the immune mechanism, unspecified: Secondary | ICD-10-CM | POA: Diagnosis not present

## 2014-09-04 DIAGNOSIS — Z940 Kidney transplant status: Secondary | ICD-10-CM | POA: Diagnosis not present

## 2014-09-04 DIAGNOSIS — I10 Essential (primary) hypertension: Secondary | ICD-10-CM | POA: Diagnosis not present

## 2014-09-05 DIAGNOSIS — D709 Neutropenia, unspecified: Secondary | ICD-10-CM | POA: Diagnosis not present

## 2014-09-05 DIAGNOSIS — Z940 Kidney transplant status: Secondary | ICD-10-CM | POA: Diagnosis not present

## 2014-09-25 DIAGNOSIS — Z940 Kidney transplant status: Secondary | ICD-10-CM | POA: Diagnosis not present

## 2014-09-25 DIAGNOSIS — I120 Hypertensive chronic kidney disease with stage 5 chronic kidney disease or end stage renal disease: Secondary | ICD-10-CM | POA: Diagnosis not present

## 2014-09-25 DIAGNOSIS — Z4822 Encounter for aftercare following kidney transplant: Secondary | ICD-10-CM | POA: Diagnosis not present

## 2014-09-25 DIAGNOSIS — Z79899 Other long term (current) drug therapy: Secondary | ICD-10-CM | POA: Diagnosis not present

## 2014-09-25 DIAGNOSIS — D899 Disorder involving the immune mechanism, unspecified: Secondary | ICD-10-CM | POA: Diagnosis not present

## 2014-09-25 DIAGNOSIS — N186 End stage renal disease: Secondary | ICD-10-CM | POA: Diagnosis not present

## 2014-09-25 DIAGNOSIS — D72819 Decreased white blood cell count, unspecified: Secondary | ICD-10-CM | POA: Diagnosis not present

## 2014-10-11 DIAGNOSIS — D631 Anemia in chronic kidney disease: Secondary | ICD-10-CM | POA: Diagnosis not present

## 2014-10-11 DIAGNOSIS — I129 Hypertensive chronic kidney disease with stage 1 through stage 4 chronic kidney disease, or unspecified chronic kidney disease: Secondary | ICD-10-CM | POA: Diagnosis not present

## 2014-10-11 DIAGNOSIS — N183 Chronic kidney disease, stage 3 (moderate): Secondary | ICD-10-CM | POA: Diagnosis not present

## 2014-10-11 DIAGNOSIS — Z940 Kidney transplant status: Secondary | ICD-10-CM | POA: Diagnosis not present

## 2014-10-24 DIAGNOSIS — N390 Urinary tract infection, site not specified: Secondary | ICD-10-CM | POA: Diagnosis not present

## 2014-10-24 DIAGNOSIS — Z940 Kidney transplant status: Secondary | ICD-10-CM | POA: Diagnosis not present

## 2014-10-24 DIAGNOSIS — R319 Hematuria, unspecified: Secondary | ICD-10-CM | POA: Diagnosis not present

## 2015-02-02 DIAGNOSIS — M65331 Trigger finger, right middle finger: Secondary | ICD-10-CM | POA: Diagnosis not present

## 2015-02-02 DIAGNOSIS — G5601 Carpal tunnel syndrome, right upper limb: Secondary | ICD-10-CM | POA: Diagnosis not present

## 2015-02-02 DIAGNOSIS — G5602 Carpal tunnel syndrome, left upper limb: Secondary | ICD-10-CM | POA: Diagnosis not present

## 2015-02-16 DIAGNOSIS — G5601 Carpal tunnel syndrome, right upper limb: Secondary | ICD-10-CM | POA: Diagnosis not present

## 2015-02-16 DIAGNOSIS — M79641 Pain in right hand: Secondary | ICD-10-CM | POA: Diagnosis not present

## 2015-03-02 DIAGNOSIS — Z79899 Other long term (current) drug therapy: Secondary | ICD-10-CM | POA: Diagnosis not present

## 2015-03-02 DIAGNOSIS — N189 Chronic kidney disease, unspecified: Secondary | ICD-10-CM | POA: Diagnosis not present

## 2015-03-02 DIAGNOSIS — Z940 Kidney transplant status: Secondary | ICD-10-CM | POA: Diagnosis not present

## 2015-03-05 DIAGNOSIS — D631 Anemia in chronic kidney disease: Secondary | ICD-10-CM | POA: Diagnosis not present

## 2015-03-05 DIAGNOSIS — E1122 Type 2 diabetes mellitus with diabetic chronic kidney disease: Secondary | ICD-10-CM | POA: Diagnosis not present

## 2015-03-05 DIAGNOSIS — I129 Hypertensive chronic kidney disease with stage 1 through stage 4 chronic kidney disease, or unspecified chronic kidney disease: Secondary | ICD-10-CM | POA: Diagnosis not present

## 2015-03-05 DIAGNOSIS — N183 Chronic kidney disease, stage 3 (moderate): Secondary | ICD-10-CM | POA: Diagnosis not present

## 2015-04-30 DIAGNOSIS — N186 End stage renal disease: Secondary | ICD-10-CM | POA: Diagnosis not present

## 2015-04-30 DIAGNOSIS — Z79899 Other long term (current) drug therapy: Secondary | ICD-10-CM | POA: Diagnosis not present

## 2015-04-30 DIAGNOSIS — Z4822 Encounter for aftercare following kidney transplant: Secondary | ICD-10-CM | POA: Diagnosis not present

## 2015-04-30 DIAGNOSIS — Z940 Kidney transplant status: Secondary | ICD-10-CM | POA: Diagnosis not present

## 2015-04-30 DIAGNOSIS — R7611 Nonspecific reaction to tuberculin skin test without active tuberculosis: Secondary | ICD-10-CM | POA: Diagnosis not present

## 2015-04-30 DIAGNOSIS — I120 Hypertensive chronic kidney disease with stage 5 chronic kidney disease or end stage renal disease: Secondary | ICD-10-CM | POA: Diagnosis not present

## 2015-04-30 DIAGNOSIS — D899 Disorder involving the immune mechanism, unspecified: Secondary | ICD-10-CM | POA: Diagnosis not present

## 2015-04-30 DIAGNOSIS — N289 Disorder of kidney and ureter, unspecified: Secondary | ICD-10-CM | POA: Diagnosis not present

## 2015-04-30 DIAGNOSIS — I10 Essential (primary) hypertension: Secondary | ICD-10-CM | POA: Diagnosis not present

## 2015-04-30 DIAGNOSIS — D631 Anemia in chronic kidney disease: Secondary | ICD-10-CM | POA: Diagnosis not present

## 2015-04-30 DIAGNOSIS — E213 Hyperparathyroidism, unspecified: Secondary | ICD-10-CM | POA: Diagnosis not present

## 2015-05-21 DIAGNOSIS — E1122 Type 2 diabetes mellitus with diabetic chronic kidney disease: Secondary | ICD-10-CM | POA: Diagnosis not present

## 2015-05-21 DIAGNOSIS — N2581 Secondary hyperparathyroidism of renal origin: Secondary | ICD-10-CM | POA: Diagnosis not present

## 2015-05-21 DIAGNOSIS — N189 Chronic kidney disease, unspecified: Secondary | ICD-10-CM | POA: Diagnosis not present

## 2015-05-21 DIAGNOSIS — N183 Chronic kidney disease, stage 3 (moderate): Secondary | ICD-10-CM | POA: Diagnosis not present

## 2015-05-23 DIAGNOSIS — N529 Male erectile dysfunction, unspecified: Secondary | ICD-10-CM | POA: Diagnosis not present

## 2015-05-23 DIAGNOSIS — D631 Anemia in chronic kidney disease: Secondary | ICD-10-CM | POA: Diagnosis not present

## 2015-05-23 DIAGNOSIS — N2581 Secondary hyperparathyroidism of renal origin: Secondary | ICD-10-CM | POA: Diagnosis not present

## 2015-05-23 DIAGNOSIS — Z940 Kidney transplant status: Secondary | ICD-10-CM | POA: Diagnosis not present

## 2015-05-23 DIAGNOSIS — I129 Hypertensive chronic kidney disease with stage 1 through stage 4 chronic kidney disease, or unspecified chronic kidney disease: Secondary | ICD-10-CM | POA: Diagnosis not present

## 2015-05-23 DIAGNOSIS — N183 Chronic kidney disease, stage 3 (moderate): Secondary | ICD-10-CM | POA: Diagnosis not present

## 2015-06-26 DIAGNOSIS — Z940 Kidney transplant status: Secondary | ICD-10-CM | POA: Diagnosis not present

## 2015-09-28 DIAGNOSIS — E1122 Type 2 diabetes mellitus with diabetic chronic kidney disease: Secondary | ICD-10-CM | POA: Diagnosis not present

## 2015-09-28 DIAGNOSIS — N2581 Secondary hyperparathyroidism of renal origin: Secondary | ICD-10-CM | POA: Diagnosis not present

## 2015-09-28 DIAGNOSIS — Z940 Kidney transplant status: Secondary | ICD-10-CM | POA: Diagnosis not present

## 2015-10-03 DIAGNOSIS — N2581 Secondary hyperparathyroidism of renal origin: Secondary | ICD-10-CM | POA: Diagnosis not present

## 2015-10-03 DIAGNOSIS — N183 Chronic kidney disease, stage 3 (moderate): Secondary | ICD-10-CM | POA: Diagnosis not present

## 2015-10-03 DIAGNOSIS — Z940 Kidney transplant status: Secondary | ICD-10-CM | POA: Diagnosis not present

## 2015-10-03 DIAGNOSIS — D631 Anemia in chronic kidney disease: Secondary | ICD-10-CM | POA: Diagnosis not present

## 2015-10-03 DIAGNOSIS — I129 Hypertensive chronic kidney disease with stage 1 through stage 4 chronic kidney disease, or unspecified chronic kidney disease: Secondary | ICD-10-CM | POA: Diagnosis not present

## 2015-12-06 DIAGNOSIS — E1122 Type 2 diabetes mellitus with diabetic chronic kidney disease: Secondary | ICD-10-CM | POA: Diagnosis not present

## 2015-12-06 DIAGNOSIS — N2581 Secondary hyperparathyroidism of renal origin: Secondary | ICD-10-CM | POA: Diagnosis not present

## 2015-12-06 DIAGNOSIS — Z940 Kidney transplant status: Secondary | ICD-10-CM | POA: Diagnosis not present

## 2016-02-15 DIAGNOSIS — N186 End stage renal disease: Secondary | ICD-10-CM | POA: Diagnosis not present

## 2016-02-15 DIAGNOSIS — Z885 Allergy status to narcotic agent status: Secondary | ICD-10-CM | POA: Diagnosis not present

## 2016-02-15 DIAGNOSIS — Z940 Kidney transplant status: Secondary | ICD-10-CM | POA: Diagnosis not present

## 2016-02-15 DIAGNOSIS — I120 Hypertensive chronic kidney disease with stage 5 chronic kidney disease or end stage renal disease: Secondary | ICD-10-CM | POA: Diagnosis not present

## 2016-02-15 DIAGNOSIS — N359 Urethral stricture, unspecified: Secondary | ICD-10-CM | POA: Diagnosis not present

## 2016-02-15 DIAGNOSIS — N3000 Acute cystitis without hematuria: Secondary | ICD-10-CM | POA: Diagnosis not present

## 2016-02-15 DIAGNOSIS — Z992 Dependence on renal dialysis: Secondary | ICD-10-CM | POA: Diagnosis not present

## 2016-02-15 DIAGNOSIS — Z79899 Other long term (current) drug therapy: Secondary | ICD-10-CM | POA: Diagnosis not present

## 2016-03-10 DIAGNOSIS — N183 Chronic kidney disease, stage 3 (moderate): Secondary | ICD-10-CM | POA: Diagnosis not present

## 2016-03-10 DIAGNOSIS — D631 Anemia in chronic kidney disease: Secondary | ICD-10-CM | POA: Diagnosis not present

## 2016-03-10 DIAGNOSIS — Z940 Kidney transplant status: Secondary | ICD-10-CM | POA: Diagnosis not present

## 2016-03-10 DIAGNOSIS — I129 Hypertensive chronic kidney disease with stage 1 through stage 4 chronic kidney disease, or unspecified chronic kidney disease: Secondary | ICD-10-CM | POA: Diagnosis not present

## 2016-03-10 DIAGNOSIS — Z6831 Body mass index (BMI) 31.0-31.9, adult: Secondary | ICD-10-CM | POA: Diagnosis not present

## 2016-03-10 DIAGNOSIS — N2581 Secondary hyperparathyroidism of renal origin: Secondary | ICD-10-CM | POA: Diagnosis not present

## 2016-03-17 DIAGNOSIS — D638 Anemia in other chronic diseases classified elsewhere: Secondary | ICD-10-CM | POA: Diagnosis not present

## 2016-03-17 DIAGNOSIS — N189 Chronic kidney disease, unspecified: Secondary | ICD-10-CM | POA: Diagnosis not present

## 2016-03-17 DIAGNOSIS — I120 Hypertensive chronic kidney disease with stage 5 chronic kidney disease or end stage renal disease: Secondary | ICD-10-CM | POA: Diagnosis not present

## 2016-03-17 DIAGNOSIS — E1122 Type 2 diabetes mellitus with diabetic chronic kidney disease: Secondary | ICD-10-CM | POA: Diagnosis not present

## 2016-03-17 DIAGNOSIS — N2581 Secondary hyperparathyroidism of renal origin: Secondary | ICD-10-CM | POA: Diagnosis not present

## 2016-03-17 DIAGNOSIS — N186 End stage renal disease: Secondary | ICD-10-CM | POA: Diagnosis not present

## 2016-03-17 DIAGNOSIS — Z992 Dependence on renal dialysis: Secondary | ICD-10-CM | POA: Diagnosis not present

## 2016-03-17 DIAGNOSIS — M109 Gout, unspecified: Secondary | ICD-10-CM | POA: Diagnosis not present

## 2016-03-17 DIAGNOSIS — G4700 Insomnia, unspecified: Secondary | ICD-10-CM | POA: Diagnosis not present

## 2016-03-17 DIAGNOSIS — I129 Hypertensive chronic kidney disease with stage 1 through stage 4 chronic kidney disease, or unspecified chronic kidney disease: Secondary | ICD-10-CM | POA: Diagnosis not present

## 2016-03-17 DIAGNOSIS — N359 Urethral stricture, unspecified: Secondary | ICD-10-CM | POA: Diagnosis not present

## 2016-03-17 DIAGNOSIS — Z885 Allergy status to narcotic agent status: Secondary | ICD-10-CM | POA: Diagnosis not present

## 2016-03-17 DIAGNOSIS — N358 Other urethral stricture: Secondary | ICD-10-CM | POA: Diagnosis not present

## 2016-03-17 DIAGNOSIS — Z940 Kidney transplant status: Secondary | ICD-10-CM | POA: Diagnosis not present

## 2016-03-17 DIAGNOSIS — N3000 Acute cystitis without hematuria: Secondary | ICD-10-CM | POA: Diagnosis not present

## 2016-03-17 DIAGNOSIS — D649 Anemia, unspecified: Secondary | ICD-10-CM | POA: Diagnosis not present

## 2016-03-17 DIAGNOSIS — D899 Disorder involving the immune mechanism, unspecified: Secondary | ICD-10-CM | POA: Diagnosis not present

## 2016-03-17 DIAGNOSIS — Z86010 Personal history of colonic polyps: Secondary | ICD-10-CM | POA: Diagnosis not present

## 2016-03-17 DIAGNOSIS — Z792 Long term (current) use of antibiotics: Secondary | ICD-10-CM | POA: Diagnosis not present

## 2016-03-17 DIAGNOSIS — Z79899 Other long term (current) drug therapy: Secondary | ICD-10-CM | POA: Diagnosis not present

## 2016-03-21 DIAGNOSIS — R339 Retention of urine, unspecified: Secondary | ICD-10-CM | POA: Diagnosis not present

## 2016-04-17 ENCOUNTER — Encounter (HOSPITAL_COMMUNITY): Payer: Self-pay | Admitting: Emergency Medicine

## 2016-04-17 ENCOUNTER — Ambulatory Visit (HOSPITAL_COMMUNITY)
Admission: EM | Admit: 2016-04-17 | Discharge: 2016-04-17 | Disposition: A | Discharge status: Home / Self Care | Payer: BLUE CROSS/BLUE SHIELD | Attending: Internal Medicine | Admitting: Internal Medicine

## 2016-04-17 ENCOUNTER — Ambulatory Visit (INDEPENDENT_AMBULATORY_CARE_PROVIDER_SITE_OTHER): Payer: BLUE CROSS/BLUE SHIELD

## 2016-04-17 DIAGNOSIS — S8991XA Unspecified injury of right lower leg, initial encounter: Secondary | ICD-10-CM | POA: Diagnosis not present

## 2016-04-17 DIAGNOSIS — S93401A Sprain of unspecified ligament of right ankle, initial encounter: Secondary | ICD-10-CM | POA: Diagnosis not present

## 2016-04-17 DIAGNOSIS — S161XXA Strain of muscle, fascia and tendon at neck level, initial encounter: Secondary | ICD-10-CM

## 2016-04-17 DIAGNOSIS — T148 Other injury of unspecified body region: Secondary | ICD-10-CM | POA: Diagnosis not present

## 2016-04-17 DIAGNOSIS — R519 Headache, unspecified: Secondary | ICD-10-CM

## 2016-04-17 DIAGNOSIS — M25571 Pain in right ankle and joints of right foot: Secondary | ICD-10-CM | POA: Diagnosis not present

## 2016-04-17 DIAGNOSIS — S86011A Strain of right Achilles tendon, initial encounter: Secondary | ICD-10-CM | POA: Diagnosis not present

## 2016-04-17 DIAGNOSIS — R42 Dizziness and giddiness: Secondary | ICD-10-CM

## 2016-04-17 DIAGNOSIS — R51 Headache: Secondary | ICD-10-CM

## 2016-04-17 DIAGNOSIS — S99911A Unspecified injury of right ankle, initial encounter: Secondary | ICD-10-CM | POA: Diagnosis not present

## 2016-04-17 DIAGNOSIS — T148XXA Other injury of unspecified body region, initial encounter: Secondary | ICD-10-CM

## 2016-04-17 DIAGNOSIS — M79661 Pain in right lower leg: Secondary | ICD-10-CM | POA: Diagnosis not present
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
## 2016-04-17 NOTE — Discharge Instructions (Signed)
Wear the Ace wrap and the ASO ankle brace for the next 3-4 days. Ice to the ankle and heat to the muscle. If he continued to have problems with walking or pain that has increased her eye getting better follow-up with your doctor. Heat to your right shoulder and neck muscle. Perform stretches as demonstrated. Instructions for potential head injury problems. 4 problems with vision, speech, hearing, swallowing, unusual or generalized weakness, unusual sleepiness, increasing pain or vomiting or fever go to emergency department promptly.

## 2016-05-05 ENCOUNTER — Telehealth: Payer: Self-pay | Admitting: Internal Medicine

## 2016-05-05 NOTE — Telephone Encounter (Signed)
Pt called in and would like to know if you would take him back on as a new pt?  Last seen 2012

## 2016-05-06 NOTE — Telephone Encounter (Signed)
Yes, I will see him.

## 2016-05-12 ENCOUNTER — Ambulatory Visit (INDEPENDENT_AMBULATORY_CARE_PROVIDER_SITE_OTHER): Payer: BLUE CROSS/BLUE SHIELD | Admitting: Internal Medicine

## 2016-05-12 ENCOUNTER — Encounter: Payer: Self-pay | Admitting: Internal Medicine

## 2016-05-12 VITALS — BP 110/74 | HR 71 | Temp 97.8°F | Resp 16 | Ht 70.0 in | Wt 207.2 lb

## 2016-05-12 DIAGNOSIS — M7989 Other specified soft tissue disorders: Secondary | ICD-10-CM

## 2016-05-12 DIAGNOSIS — M79661 Pain in right lower leg: Secondary | ICD-10-CM | POA: Diagnosis not present

## 2016-05-12 DIAGNOSIS — I739 Peripheral vascular disease, unspecified: Secondary | ICD-10-CM
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
## 2016-05-12 NOTE — Progress Notes (Signed)
Pre visit review using our clinic review tool, if applicable. No additional management support is needed unless otherwise documented below in the visit note. 

## 2016-05-13 NOTE — Patient Instructions (Signed)
Leg Cramps Leg cramps occur when a muscle or muscles tighten and you have no control over this tightening (involuntary muscle contraction). Muscle cramps can develop in any muscle, but the most common place is in the calf muscles of the leg. Those cramps can occur during exercise or when you are at rest. Leg cramps are painful, and they may last for a few seconds to a few minutes. Cramps may return several times before they finally stop. Usually, leg cramps are not caused by a serious medical problem. In many cases, the cause is not known. Some common causes include:  Overexertion.  Overuse from repetitive motions, or doing the same thing over and over.  Remaining in a certain position for a long period of time.  Improper preparation, form, or technique while performing a sport or an activity.  Dehydration.  Injury.  Side effects of some medicines.  Abnormally low levels of the salts and ions in your blood (electrolytes), especially potassium and calcium. These levels could be low if you are taking water pills (diuretics) or if you are pregnant. HOME CARE INSTRUCTIONS Watch your condition for any changes. Taking the following actions may help to lessen any discomfort that you are feeling:  Stay well-hydrated. Drink enough fluid to keep your urine clear or pale yellow.  Try massaging, stretching, and relaxing the affected muscle. Do this for several minutes at a time.  For tight or tense muscles, use a warm towel, heating pad, or hot shower water directed to the affected area.  If you are sore or have pain after a cramp, applying ice to the affected area may relieve discomfort.  Put ice in a plastic bag.  Place a towel between your skin and the bag.  Leave the ice on for 20 minutes, 2-3 times per day.  Avoid strenuous exercise for several days if you have been having frequent leg cramps.  Make sure that your diet includes the essential minerals for your muscles to work  normally.  Take medicines only as directed by your health care provider. SEEK MEDICAL CARE IF:  Your leg cramps get more severe or more frequent, or they do not improve over time.  Your foot becomes cold, numb, or blue.   This information is not intended to replace advice given to you by your health care provider. Make sure you discuss any questions you have with your health care provider.   Document Released: 09/04/2004 Document Revised: 12/12/2014 Document Reviewed: 07/05/2014 Elsevier Interactive Patient Education 2016 [employer]   

## 2016-05-19 ENCOUNTER — Other Ambulatory Visit: Payer: Self-pay | Admitting: Internal Medicine

## 2016-05-19 DIAGNOSIS — I739 Peripheral vascular disease, unspecified: Secondary | ICD-10-CM

## 2016-05-20 ENCOUNTER — Ambulatory Visit (HOSPITAL_COMMUNITY)
Admission: RE | Admit: 2016-05-20 | Discharge: 2016-05-20 | Disposition: A | Discharge status: Home / Self Care | Payer: Medicare Other | Source: Ambulatory Visit | Attending: Cardiovascular Disease | Admitting: Cardiovascular Disease

## 2016-05-20 DIAGNOSIS — M7989 Other specified soft tissue disorders: Secondary | ICD-10-CM | POA: Insufficient documentation

## 2016-05-20 DIAGNOSIS — M79661 Pain in right lower leg: Secondary | ICD-10-CM | POA: Diagnosis not present

## 2016-05-22 ENCOUNTER — Other Ambulatory Visit: Payer: Self-pay | Admitting: Internal Medicine

## 2016-05-22 ENCOUNTER — Telehealth: Payer: Self-pay | Admitting: Emergency Medicine

## 2016-05-22 DIAGNOSIS — M7989 Other specified soft tissue disorders: Secondary | ICD-10-CM

## 2016-05-22 DIAGNOSIS — R2241 Localized swelling, mass and lump, right lower limb: Secondary | ICD-10-CM | POA: Insufficient documentation

## 2016-05-22 DIAGNOSIS — M79661 Pain in right lower leg: Secondary | ICD-10-CM

## 2016-05-22 NOTE — Telephone Encounter (Signed)
I lvmm regarding this.

## 2016-05-22 NOTE — Telephone Encounter (Signed)
[facility] called and asked if the patients order can be changed to with and without contrast. Please advise thanks.

## 2016-05-22 NOTE — Telephone Encounter (Signed)
I don't think his kidney function can handle contrast

## 2016-05-22 NOTE — Telephone Encounter (Signed)
Please advise. I can change order if you approve

## 2016-05-23 NOTE — Telephone Encounter (Signed)
[facility] is aware of Dr. Ronnald Ramp response

## 2016-05-26 ENCOUNTER — Encounter (HOSPITAL_COMMUNITY): Payer: BLUE CROSS/BLUE SHIELD

## 2016-06-04 ENCOUNTER — Ambulatory Visit
Admission: RE | Admit: 2016-06-04 | Discharge: 2016-06-04 | Disposition: A | Discharge status: Home / Self Care | Payer: BLUE CROSS/BLUE SHIELD | Source: Ambulatory Visit | Attending: Internal Medicine | Admitting: Internal Medicine

## 2016-06-04 ENCOUNTER — Ambulatory Visit (HOSPITAL_COMMUNITY)
Admission: RE | Admit: 2016-06-04 | Discharge: 2016-06-04 | Disposition: A | Discharge status: Home / Self Care | Payer: Medicare Other | Source: Ambulatory Visit | Attending: Internal Medicine | Admitting: Internal Medicine

## 2016-06-04 DIAGNOSIS — I10 Essential (primary) hypertension: Secondary | ICD-10-CM | POA: Insufficient documentation

## 2016-06-04 DIAGNOSIS — M7989 Other specified soft tissue disorders: Principal | ICD-10-CM

## 2016-06-04 DIAGNOSIS — R2241 Localized swelling, mass and lump, right lower limb: Secondary | ICD-10-CM

## 2016-06-04 DIAGNOSIS — E785 Hyperlipidemia, unspecified: Secondary | ICD-10-CM | POA: Insufficient documentation

## 2016-06-04 DIAGNOSIS — I739 Peripheral vascular disease, unspecified: Secondary | ICD-10-CM | POA: Diagnosis not present

## 2016-06-04 DIAGNOSIS — M79661 Pain in right lower leg: Secondary | ICD-10-CM | POA: Diagnosis not present

## 2016-06-04 DIAGNOSIS — M79604 Pain in right leg: Secondary | ICD-10-CM | POA: Diagnosis not present

## 2016-06-05 ENCOUNTER — Other Ambulatory Visit: Payer: Self-pay | Admitting: Internal Medicine

## 2016-06-05 DIAGNOSIS — S96811S Strain of other specified muscles and tendons at ankle and foot level, right foot, sequela: Secondary | ICD-10-CM

## 2016-06-12 DIAGNOSIS — S8011XA Contusion of right lower leg, initial encounter: Secondary | ICD-10-CM | POA: Diagnosis not present

## 2016-07-08 DIAGNOSIS — N184 Chronic kidney disease, stage 4 (severe): Secondary | ICD-10-CM | POA: Diagnosis not present

## 2016-07-08 DIAGNOSIS — N2581 Secondary hyperparathyroidism of renal origin: Secondary | ICD-10-CM | POA: Diagnosis not present

## 2016-07-08 DIAGNOSIS — Z940 Kidney transplant status: Secondary | ICD-10-CM | POA: Diagnosis not present

## 2016-07-08 DIAGNOSIS — N189 Chronic kidney disease, unspecified: Secondary | ICD-10-CM | POA: Diagnosis not present

## 2016-07-08 DIAGNOSIS — E1122 Type 2 diabetes mellitus with diabetic chronic kidney disease: Secondary | ICD-10-CM | POA: Diagnosis not present

## 2016-07-11 DIAGNOSIS — Z940 Kidney transplant status: Secondary | ICD-10-CM | POA: Diagnosis not present

## 2016-07-11 DIAGNOSIS — I129 Hypertensive chronic kidney disease with stage 1 through stage 4 chronic kidney disease, or unspecified chronic kidney disease: Secondary | ICD-10-CM | POA: Diagnosis not present

## 2016-07-11 DIAGNOSIS — N2581 Secondary hyperparathyroidism of renal origin: Secondary | ICD-10-CM | POA: Diagnosis not present

## 2016-07-11 DIAGNOSIS — Z6831 Body mass index (BMI) 31.0-31.9, adult: Secondary | ICD-10-CM | POA: Diagnosis not present

## 2016-07-11 DIAGNOSIS — D631 Anemia in chronic kidney disease: Secondary | ICD-10-CM | POA: Diagnosis not present

## 2016-07-11 DIAGNOSIS — N183 Chronic kidney disease, stage 3 (moderate): Secondary | ICD-10-CM | POA: Diagnosis not present

## 2016-07-18 DIAGNOSIS — G5791 Unspecified mononeuropathy of right lower limb: Secondary | ICD-10-CM | POA: Diagnosis not present

## 2016-07-18 DIAGNOSIS — M19071 Primary osteoarthritis, right ankle and foot: Secondary | ICD-10-CM | POA: Diagnosis not present

## 2016-07-31 DIAGNOSIS — M5417 Radiculopathy, lumbosacral region: Secondary | ICD-10-CM | POA: Diagnosis not present

## 2016-07-31 DIAGNOSIS — E559 Vitamin D deficiency, unspecified: Secondary | ICD-10-CM | POA: Diagnosis not present

## 2016-07-31 DIAGNOSIS — G603 Idiopathic progressive neuropathy: Secondary | ICD-10-CM | POA: Diagnosis not present

## 2016-07-31 DIAGNOSIS — M5412 Radiculopathy, cervical region: Secondary | ICD-10-CM | POA: Diagnosis not present

## 2016-07-31 DIAGNOSIS — R634 Abnormal weight loss: Secondary | ICD-10-CM | POA: Diagnosis not present

## 2016-07-31 DIAGNOSIS — G5603 Carpal tunnel syndrome, bilateral upper limbs: Secondary | ICD-10-CM | POA: Diagnosis not present

## 2016-07-31 DIAGNOSIS — G609 Hereditary and idiopathic neuropathy, unspecified: Secondary | ICD-10-CM | POA: Diagnosis not present

## 2016-07-31 DIAGNOSIS — M542 Cervicalgia: Secondary | ICD-10-CM | POA: Diagnosis not present

## 2016-07-31 DIAGNOSIS — R202 Paresthesia of skin: Secondary | ICD-10-CM | POA: Diagnosis not present

## 2016-07-31 DIAGNOSIS — E531 Pyridoxine deficiency: Secondary | ICD-10-CM | POA: Diagnosis not present

## 2016-07-31 DIAGNOSIS — R2689 Other abnormalities of gait and mobility: Secondary | ICD-10-CM | POA: Diagnosis not present

## 2016-07-31 DIAGNOSIS — E538 Deficiency of other specified B group vitamins: Secondary | ICD-10-CM | POA: Diagnosis not present

## 2016-08-14 DIAGNOSIS — M5442 Lumbago with sciatica, left side: Secondary | ICD-10-CM | POA: Diagnosis not present

## 2016-08-14 DIAGNOSIS — M5412 Radiculopathy, cervical region: Secondary | ICD-10-CM | POA: Diagnosis not present

## 2016-08-14 DIAGNOSIS — G603 Idiopathic progressive neuropathy: Secondary | ICD-10-CM | POA: Diagnosis not present

## 2016-08-14 DIAGNOSIS — E559 Vitamin D deficiency, unspecified: Secondary | ICD-10-CM | POA: Diagnosis not present

## 2016-08-14 DIAGNOSIS — M5441 Lumbago with sciatica, right side: Secondary | ICD-10-CM | POA: Diagnosis not present

## 2016-10-29 DIAGNOSIS — Z940 Kidney transplant status: Secondary | ICD-10-CM | POA: Diagnosis not present

## 2016-10-29 DIAGNOSIS — N390 Urinary tract infection, site not specified: Secondary | ICD-10-CM | POA: Diagnosis not present

## 2016-10-29 DIAGNOSIS — N2581 Secondary hyperparathyroidism of renal origin: Secondary | ICD-10-CM | POA: Diagnosis not present

## 2016-10-29 DIAGNOSIS — E1122 Type 2 diabetes mellitus with diabetic chronic kidney disease: Secondary | ICD-10-CM | POA: Diagnosis not present

## 2016-10-29 DIAGNOSIS — N189 Chronic kidney disease, unspecified: Secondary | ICD-10-CM | POA: Diagnosis not present

## 2016-11-03 DIAGNOSIS — D631 Anemia in chronic kidney disease: Secondary | ICD-10-CM | POA: Diagnosis not present

## 2016-11-03 DIAGNOSIS — N183 Chronic kidney disease, stage 3 (moderate): Secondary | ICD-10-CM | POA: Diagnosis not present

## 2016-11-03 DIAGNOSIS — Z6831 Body mass index (BMI) 31.0-31.9, adult: Secondary | ICD-10-CM | POA: Diagnosis not present

## 2016-11-03 DIAGNOSIS — I129 Hypertensive chronic kidney disease with stage 1 through stage 4 chronic kidney disease, or unspecified chronic kidney disease: Secondary | ICD-10-CM | POA: Diagnosis not present

## 2016-11-03 DIAGNOSIS — N2581 Secondary hyperparathyroidism of renal origin: Secondary | ICD-10-CM | POA: Diagnosis not present

## 2016-11-11 ENCOUNTER — Other Ambulatory Visit: Payer: Self-pay | Admitting: Nephrology

## 2016-11-11 DIAGNOSIS — N183 Chronic kidney disease, stage 3 unspecified: Secondary | ICD-10-CM

## 2016-11-18 ENCOUNTER — Ambulatory Visit
Admission: RE | Admit: 2016-11-18 | Discharge: 2016-11-18 | Disposition: A | Discharge status: Home / Self Care | Payer: BLUE CROSS/BLUE SHIELD | Source: Ambulatory Visit | Attending: Nephrology | Admitting: Nephrology

## 2016-11-18 ENCOUNTER — Other Ambulatory Visit: Payer: Self-pay | Admitting: Nephrology

## 2016-11-18 DIAGNOSIS — N183 Chronic kidney disease, stage 3 unspecified: Secondary | ICD-10-CM

## 2017-02-10 DIAGNOSIS — E785 Hyperlipidemia, unspecified: Secondary | ICD-10-CM | POA: Diagnosis not present

## 2017-02-10 DIAGNOSIS — D631 Anemia in chronic kidney disease: Secondary | ICD-10-CM | POA: Diagnosis not present

## 2017-02-10 DIAGNOSIS — Z940 Kidney transplant status: Secondary | ICD-10-CM | POA: Diagnosis not present

## 2017-02-10 DIAGNOSIS — N2581 Secondary hyperparathyroidism of renal origin: Secondary | ICD-10-CM | POA: Diagnosis not present

## 2017-02-20 DIAGNOSIS — E1122 Type 2 diabetes mellitus with diabetic chronic kidney disease: Secondary | ICD-10-CM | POA: Diagnosis not present

## 2017-02-20 DIAGNOSIS — N2581 Secondary hyperparathyroidism of renal origin: Secondary | ICD-10-CM | POA: Diagnosis not present

## 2017-02-20 DIAGNOSIS — D631 Anemia in chronic kidney disease: Secondary | ICD-10-CM | POA: Diagnosis not present

## 2017-02-20 DIAGNOSIS — I129 Hypertensive chronic kidney disease with stage 1 through stage 4 chronic kidney disease, or unspecified chronic kidney disease: Secondary | ICD-10-CM | POA: Diagnosis not present

## 2017-02-20 DIAGNOSIS — Z940 Kidney transplant status: Secondary | ICD-10-CM | POA: Diagnosis not present

## 2017-02-20 DIAGNOSIS — N183 Chronic kidney disease, stage 3 (moderate): Secondary | ICD-10-CM | POA: Diagnosis not present

## 2017-02-23 LAB — HEMOGLOBIN A1C: Hemoglobin A1C: 15

## 2017-02-23 LAB — HEPATIC FUNCTION PANEL
ALT: 12 (ref 10–40)
AST: 16 (ref 14–40)
Alkaline Phosphatase: 112 (ref 25–125)
BILIRUBIN, TOTAL: 0.6

## 2017-02-23 LAB — LIPID PANEL
CHOLESTEROL: 203 — AB (ref 0–200)
HDL: 66 (ref 35–70)
LDL Cholesterol: 112
TRIGLYCERIDES: 126 (ref 40–160)

## 2017-02-23 LAB — CBC AND DIFFERENTIAL
HEMATOCRIT: 44 (ref 41–53)
Hemoglobin: 14.7 (ref 13.5–17.5)
Platelets: 138 — AB (ref 150–399)
WBC: 3.8

## 2017-02-23 LAB — BASIC METABOLIC PANEL
BUN: 19 (ref 4–21)
CREATININE: 1.8 — AB (ref 0.6–1.3)
GLUCOSE: 388
POTASSIUM: 4.1 (ref 3.4–5.3)
SODIUM: 138 (ref 137–147)

## 2017-02-25 ENCOUNTER — Encounter: Payer: Self-pay | Admitting: Internal Medicine

## 2017-02-25 ENCOUNTER — Ambulatory Visit (INDEPENDENT_AMBULATORY_CARE_PROVIDER_SITE_OTHER): Payer: BLUE CROSS/BLUE SHIELD | Admitting: Internal Medicine

## 2017-02-25 VITALS — BP 124/84 | HR 66 | Temp 98.3°F | Resp 16 | Ht 70.0 in | Wt 190.0 lb

## 2017-02-25 DIAGNOSIS — N186 End stage renal disease: Secondary | ICD-10-CM

## 2017-02-25 DIAGNOSIS — N184 Chronic kidney disease, stage 4 (severe): Secondary | ICD-10-CM

## 2017-02-25 DIAGNOSIS — IMO0002 Reserved for concepts with insufficient information to code with codable children: Secondary | ICD-10-CM

## 2017-02-25 DIAGNOSIS — I10 Essential (primary) hypertension: Secondary | ICD-10-CM | POA: Diagnosis not present

## 2017-02-25 DIAGNOSIS — E1065 Type 1 diabetes mellitus with hyperglycemia: Secondary | ICD-10-CM | POA: Diagnosis not present

## 2017-02-25 DIAGNOSIS — E1022 Type 1 diabetes mellitus with diabetic chronic kidney disease: Secondary | ICD-10-CM | POA: Diagnosis not present

## 2017-02-25 MED ORDER — GLUCOSE BLOOD VI STRP: ORAL_STRIP | 12 refills | Status: DC

## 2017-02-25 MED ORDER — INSULIN DEGLUDEC 200 UNIT/ML ~~LOC~~ SOPN: 30.0000 [IU] | PEN_INJECTOR | Freq: Every day | SUBCUTANEOUS | 5 refills | Status: DC

## 2017-02-25 MED ORDER — ONETOUCH ULTRA MINI W/DEVICE KIT: 1.0000 | PACK | Freq: Four times a day (QID) | 1 refills | Status: DC

## 2017-02-25 MED ORDER — INSULIN REGULAR HUMAN (CONC) 500 UNIT/ML ~~LOC~~ SOPN: 10.0000 [IU] | PEN_INJECTOR | Freq: Three times a day (TID) | SUBCUTANEOUS | 5 refills | Status: DC

## 2017-02-25 MED ORDER — INSULIN PEN NEEDLE 32G X 6 MM MISC: 1.0000 | Freq: Four times a day (QID) | 11 refills | Status: DC

## 2017-02-25 NOTE — Patient Instructions (Signed)
Type 1 Diabetes Mellitus, Diagnosis, Adult Type 1 diabetes (type 1 diabetes mellitus) is a long-term (chronic) disease. It occurs when the pancreas does not make enough of a hormone called insulin. Normally, insulin allows sugars (glucose) to enter cells in the body. The cells use glucose for energy. Lack of insulin causes excess glucose to build up in the blood instead of going into cells. As a result, high blood glucose (hyperglycemia) develops. The exact cause of type 1 diabetes is not known. There is currently no cure for type 1 diabetes, but it can be managed with insulin treatment and lifestyle changes. What increases the risk? You may be more likely to develop this condition if you have a family member who has type 1 diabetes. Other factors may also make you more likely to develop type 1 diabetes, such as:  Having a gene for type 1 diabetes that is passed along from parent to child (inherited).  Living in an area with cold weather conditions.  Exposure to certain viruses.  Certain conditions in which the body's disease-fighting (immune) system attacks itself (autoimmune disorders).  What are the signs or symptoms? Symptoms may develop gradually, over days or weeks, or they may develop suddenly. Symptoms may include:  Increased thirst (polydipsia).  Increased hunger(polyphagia).  Increased urination (polyuria).  Increased urination during the night (nocturia).  Sudden or unexplained weight changes.  Frequent infections that keep coming back (recurring).  Fatigue.  Weakness.  Vision changes, such as blurry vision.  Fruity-smelling breath.  Cuts or bruises that are slow to heal.  Tingling or numbness in the hands or feet.  How is this diagnosed?  This condition is diagnosed based on your symptoms, your medical history, a physical exam, and your blood glucose level. Your blood glucose may be checked with one or more of the following blood tests:  A fasting blood  glucose (FBG) test. You will not be allowed to eat (you will fast) for at least 8 hours before a blood sample is taken.  A random blood glucose test. This checks blood glucose at any time of day regardless of when you ate.  An A1c (hemoglobin A1c) blood test. This provides information about blood glucose control over the previous 2-3 months.  You may be diagnosed with type 1 diabetes if:  Your FBG level is 126 mg/dL (7.0 mmol/L) or higher.  Your random blood glucose level is 200 mg/dL (11.1 mmol/L) or higher.  Your A1c level is 6.5% or higher.  These blood tests may be repeated to confirm your diagnosis. How is this treated? Your treatment may be managed by a specialist called an endocrinologist. Type 1 diabetes can be managed by following instructions from your health care provider about:  Taking insulin daily. This helps to keep your blood glucose levels in the healthy range. ? You may need to adjust your insulin dosage based on how physically active you are and what foods you eat. Your health care provider will tell you how to do this.  Taking medicines to help prevent complications from diabetes, such as: ? Aspirin. ? Medicine to lower cholesterol. ? Medicine to control blood pressure.  Checking your blood glucose as often as directed.  Making diet and lifestyle changes. These may include: ? Following an individualized nutrition plan that is developed by a diet and nutrition specialist (registered dietitian). ? Exercising regularly. ? Finding ways to manage stress.  Your health care provider will set individualized treatment goals for you. Your goals will be based on  your age, other medical conditions you have, and how you respond to diabetes treatment. Generally, the goal of treatment is to maintain the following blood glucose levels:  Before meals (preprandial): 80-130 mg/dL (4.4-7.2 mmol/L).  After meals (postprandial): below 180 mg/dL (10 mmol/L).  A1c level: less than  7%.  Follow these instructions at home: Questions to [place] Provider   Consider asking the following questions: ? Do I need to meet with a diabetes educator? ? Should I consider joining a support group for people with diabetes? ? What equipment will I need to manage my diabetes at home? ? What diabetes medicines should I take, and when? ? How often should I check my blood glucose? ? What number should I call if I have questions? ? When is my next appointment? General instructions  Take over-the-counter and prescription medicines only as told by your health care provider.  Keep all follow-up visits as told by your health care provider. This is important.  For more information about diabetes, visit: ? American Diabetes Association (ADA): www.diabetes.org ? American Association of Diabetes Educators (AADE): www.diabeteseducator.org/patient-resources Contact a health care provider if:  Your blood glucose level is higher than 240 mg/dL (13.3 mmol/L) for 2 days in a row.  You have been sick or have had a fever for 2 days or more and you are not getting better.  You have any of the following problems for more than 6 hours: ? You cannot eat or drink. ? You have nausea and vomiting. ? You have diarrhea. Get help right away if:  Your blood glucose is below 54 mg/dL (3 mmol/L).  You become confused or you have trouble thinking clearly.  You have difficulty breathing.  You have moderate or large ketone levels in your urine. This information is not intended to replace advice given to you by your health care provider. Make sure you discuss any questions you have with your health care provider. Document Released: 07/25/2000 Document Revised: 01/03/2016 Document Reviewed: 08/31/2015 Elsevier Interactive Patient Education  2017 [employer].

## 2017-02-25 NOTE — Progress Notes (Signed)
Subjective:  Patient ID: [name], male    DOB: Dec 13, 1950  Age: 66 y.o. MRN: [id number]  CC: Diabetes   HPI ERMAL HABERER presents for treatment of new onset DM. For 6 weeks he has complained of polyuria, polydipsia, polyphagia, and alterations in his visual acuity. He saw his nephrologist 2 days prior to this visit and was found to have a blood sugar of 388, a bicarbonate of 31, a sodium level of 138, and an A1c above 15%. 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  Outpatient Medications Prior to Visit  Medication Sig Dispense Refill  . acetaminophen (TYLENOL) 500 MG tablet Take 1,000 mg by mouth.    Marland Kitchen amLODipine (NORVASC) 5 MG tablet Take 5 mg by mouth daily.    . carvedilol (COREG) 25 MG tablet Take 37.5 mg by mouth 2 (two) times daily with a meal.     . magnesium oxide (MAG-OX) 400 MG tablet Take 400 mg by mouth daily.    . mycophenolate (MYFORTIC) 180 MG EC tablet Take 540 mg by mouth 2 (two) times daily.    . tacrolimus (PROGRAF) 1 MG capsule Take 6-7 mg by mouth 2 (two) times daily. Take 42m in the morning and 692min the evening    . traMADol (ULTRAM) 50 MG tablet Take 50 mg by mouth every 6 (six) hours as needed for moderate pain.     . Vitamin D, Ergocalciferol, (DRISDOL) 50000 units CAPS capsule Take by mouth.    . valGANciclovir (VALCYTE) 450 MG tablet Take 450 mg by mouth 3 (three) times a week. Monday, Wednesday and Friday    . CIALIS 10 MG tablet     . 0.9 %  sodium chloride infusion      No facility-administered medications prior to visit.     ROS Review of Systems  Constitutional: Negative.  Negative for appetite change, diaphoresis, fatigue and unexpected weight change.  HENT: Negative.   Eyes: Positive for visual disturbance.  Respiratory: Negative for cough, chest tightness, shortness of breath and wheezing.   Cardiovascular: Negative for chest pain, palpitations and leg swelling.  Gastrointestinal: Negative for abdominal pain, constipation,  diarrhea, nausea and vomiting.  Endocrine: Positive for polydipsia, polyphagia and polyuria.  Genitourinary: Negative.  Negative for difficulty urinating, dysuria, hematuria and urgency.  Musculoskeletal: Negative.   Skin: Negative.  Negative for color change and rash.  Allergic/Immunologic: Negative.   Neurological: Negative.  Negative for dizziness, weakness, light-headedness and numbness.  Hematological: Negative for adenopathy. Does not bruise/bleed easily.  Psychiatric/Behavioral: Negative.     Objective:  BP 124/84 (BP Location: Left Arm, Patient Position: Sitting, Cuff Size: Normal)   Pulse 66   Temp 98.3 F (36.8 C) (Oral)   Resp 16   Ht '5\' 10"'  (1.778 m)   Wt 190 lb (86.2 kg)   SpO2 99%   BMI 27.26 kg/m   BP Readings from Last 3 Encounters:  02/25/17 124/84  05/12/16 110/74  04/17/16 134/81    Wt Readings from Last 3 Encounters:  02/25/17 190 lb (86.2 kg)  05/12/16 207 lb 4 oz (94 kg)  06/28/14 185 lb 3 oz (84 kg)    Physical Exam  Constitutional: He is oriented to person, place, and time. No distress.  HENT:  Mouth/Throat: Oropharynx is clear and moist. No oropharyngeal exudate.  Eyes: Conjunctivae are normal. Right eye exhibits no discharge. Left eye exhibits no discharge. No scleral icterus.  Neck: Normal range of motion. Neck supple. No JVD present.  No thyromegaly present.  Cardiovascular: Normal rate, regular rhythm and intact distal pulses.  Exam reveals no gallop and no friction rub.   No murmur heard. Pulmonary/Chest: Effort normal and breath sounds normal. No respiratory distress. He has no wheezes. He has no rales. He exhibits no tenderness.  Abdominal: Soft. Bowel sounds are normal. He exhibits no distension and no mass. There is no tenderness. There is no rebound and no guarding.  Musculoskeletal: Normal range of motion. He exhibits no edema, tenderness or deformity.  Lymphadenopathy:    He has no cervical adenopathy.  Neurological: He is alert and  oriented to person, place, and time.  Skin: Skin is warm and dry. No rash noted. He is not diaphoretic. No erythema. No pallor.  Vitals reviewed.   Lab Results  Component Value Date   WBC 3.8 02/23/2017   HGB 14.7 02/23/2017   HCT 44 02/23/2017   PLT 138 (A) 02/23/2017   GLUCOSE 139 (H) 06/28/2014   CHOL 203 (A) 02/23/2017   TRIG 126 02/23/2017   HDL 66 02/23/2017   LDLCALC 112 02/23/2017   ALT 12 02/23/2017   AST 16 02/23/2017   NA 138 02/23/2017   K 4.1 02/23/2017   CL 104 06/28/2014   CREATININE 1.8 (A) 02/23/2017   BUN 19 02/23/2017   CO2 22 06/28/2014   TSH 1.34 07/30/2011   PSA 0.46 07/30/2011   INR 1.08 06/28/2014   HGBA1C 4.3 (L) 07/30/2011    US Renal Transplant W/doppler  Result Date: 11/19/2016 CLINICAL DATA:  Chronic kidney disease stage 3, right transplant kidney 2015. EXAM: ULTRASOUND OF RENAL TRANSPLANT WITH RENAL DOPPLER ULTRASOUND TECHNIQUE: Ultrasound examination of the renal transplant was performed with gray-scale, color and duplex doppler evaluation. COMPARISON:  09/08/2010 FINDINGS: Transplant kidney location: Right lower quadrant Transplant Kidney: Length: 11.7 cm. Normal in size and parenchymal echogenicity. No evidence of mass or hydronephrosis. No peri-transplant fluid collection seen. Color flow in the main renal artery:  Yes Color flow in the main renal vein:  Yes Duplex Doppler Evaluation: Main Renal Artery Resistive Index: Not obtained Venous waveform in main renal vein:  Present Intrarenal resistive index in upper pole:  0.69 (normal 0.6-0.8; equivocal 0.8-0.9; abnormal >= 0.9) Intrarenal resistive index in lower pole: 0.78 (normal 0.6-0.8; equivocal 0.8-0.9; abnormal >= 0.9) Bladder: Normal for degree of bladder distention. Right ureteral jet documented. Other findings: Small echogenic native kidneys right 8.1 cm, left 7.3 cm. 1.4 cm cystic lesion, right upper pole. No hydronephrosis. IMPRESSION: 1. Normal Doppler ultrasound assessment of right lower  quadrant renal transplant. 2. Echogenic small native kidneys with 1.4 cm right upper pole renal cystic lesion, not clearly evident on prior study of 09/08/2010. Electronically Signed   By: Lucrezia Europe M.D.   On: 11/19/2016 08:50    Assessment & Plan:   Harlen was seen today for diabetes.  Diagnoses and all orders for this visit:  Uncontrolled type 1 diabetes mellitus with stage 4 chronic kidney disease ([place])- he has new onset diabetes mellitus with a history of renal transplant and mild renal insufficiency. He has symptoms of glucose toxicity but there is no evidence of acidosis or hyperosmolar status. I will treat with aggressive insulin therapy. I've asked him to start basal and bolus insulin. For now, will hold on any oral agents but I have asked him to see endocrinology for additional evaluation and treatment. -     Cancel: Ambulatory referral to Endocrinology -     Cancel: Ambulatory referral to Ophthalmology -  Amb Referral to Nutrition and Diabetic E -     Ambulatory referral to Endocrinology -     Ambulatory referral to Ophthalmology -     Insulin Degludec (TRESIBA FLEXTOUCH) 200 UNIT/ML SOPN; Inject 30 Units into the skin daily. -     insulin regular human CONCENTRATED (HUMULIN R U-500 KWIKPEN) 500 UNIT/ML kwikpen; Inject 10 Units into the skin 3 (three) times daily with meals. -     Blood Glucose Monitoring Suppl (ONE TOUCH ULTRA MINI) w/Device KIT; 1 Act by Does not apply route 4 (four) times daily. -     glucose blood test strip; Use TID -     Insulin Pen Needle (NOVOFINE) 32G X 6 MM MISC; 1 Act by Does not apply route 4 (four) times daily.  End stage renal disease ([place])- this is followed closely by nephrology.  Essential hypertension- his blood pressures adequately well-controlled   I have discontinued Mr. Hamed Debella. I am also having him start on Insulin Degludec, insulin regular human CONCENTRATED, ONE TOUCH ULTRA MINI, glucose blood, and Insulin Pen Needle.  Additionally, I am having him maintain his carvedilol, traMADol, magnesium oxide, mycophenolate, tacrolimus, valGANciclovir, amLODipine, acetaminophen, Vitamin D (Ergocalciferol), atorvastatin, calcitRIOL, and losartan. We will stop administering sodium chloride.  Meds ordered this encounter  Medications  . atorvastatin (LIPITOR) 10 MG tablet    Sig: Take 10 mg by mouth daily.    Refill:  7  . calcitRIOL (ROCALTROL) 0.5 MCG capsule    Sig: Take 0.5 mcg by mouth daily.    Refill:  11  . losartan (COZAAR) 50 MG tablet    Sig: Take 50 mg by mouth daily.  . Insulin Degludec (TRESIBA FLEXTOUCH) 200 UNIT/ML SOPN    Sig: Inject 30 Units into the skin daily.    Dispense:  3 mL    Refill:  5  . insulin regular human CONCENTRATED (HUMULIN R U-500 KWIKPEN) 500 UNIT/ML kwikpen    Sig: Inject 10 Units into the skin 3 (three) times daily with meals.    Dispense:  3 mL    Refill:  5  . Blood Glucose Monitoring Suppl (ONE TOUCH ULTRA MINI) w/Device KIT    Sig: 1 Act by Does not apply route 4 (four) times daily.    Dispense:  1 each    Refill:  1  . glucose blood test strip    Sig: Use TID    Dispense:  100 each    Refill:  12  . Insulin Pen Needle (NOVOFINE) 32G X 6 MM MISC    Sig: 1 Act by Does not apply route 4 (four) times daily.    Dispense:  100 each    Refill:  11     Follow-up: Return in about 2 weeks (around 03/11/2017).  Scarlette Calico, MD

## 2017-03-04 ENCOUNTER — Telehealth: Payer: Self-pay | Admitting: Internal Medicine

## 2017-03-04 MED ORDER — ACCU-CHEK AVIVA PLUS W/DEVICE KIT: PACK | 0 refills | Status: DC

## 2017-03-04 MED ORDER — ACCU-CHEK SOFTCLIX LANCETS MISC: 1.0000 | Freq: Two times a day (BID) | 3 refills | Status: DC

## 2017-03-04 MED ORDER — GLUCOSE BLOOD VI STRP: 1.0000 | ORAL_STRIP | Freq: Two times a day (BID) | 3 refills | Status: DC

## 2017-03-04 NOTE — Telephone Encounter (Signed)
Pts states his Rx for glucose blood test strip  Is wrong, [facility] gave him One touch strips he needs  Accucheck strips  Please send to [facility] in [place]

## 2017-03-04 NOTE — Telephone Encounter (Signed)
Called pt had to verify pharmacy not able to pull up [facility]. Pt is requesting rx to be sent to [facility] instead. Sent electronically...[name]

## 2017-03-07 LAB — HM DIABETES EYE EXAM

## 2017-03-11 ENCOUNTER — Encounter: Payer: Self-pay | Admitting: Internal Medicine

## 2017-03-11 ENCOUNTER — Ambulatory Visit (INDEPENDENT_AMBULATORY_CARE_PROVIDER_SITE_OTHER): Payer: BLUE CROSS/BLUE SHIELD | Admitting: Internal Medicine

## 2017-03-11 VITALS — BP 130/80 | HR 65 | Temp 98.5°F | Resp 16 | Ht 70.0 in | Wt 194.5 lb

## 2017-03-11 DIAGNOSIS — IMO0002 Reserved for concepts with insufficient information to code with codable children: Secondary | ICD-10-CM

## 2017-03-11 DIAGNOSIS — N184 Chronic kidney disease, stage 4 (severe): Secondary | ICD-10-CM

## 2017-03-11 DIAGNOSIS — E1022 Type 1 diabetes mellitus with diabetic chronic kidney disease: Secondary | ICD-10-CM | POA: Diagnosis not present

## 2017-03-11 DIAGNOSIS — E1065 Type 1 diabetes mellitus with hyperglycemia: Secondary | ICD-10-CM | POA: Diagnosis not present

## 2017-03-11 LAB — POCT GLUCOSE (DEVICE FOR HOME USE): Glucose Fasting, POC: 194 mg/dL — AB (ref 70–99)

## 2017-03-11 MED ORDER — INSULIN REGULAR HUMAN (CONC) 500 UNIT/ML ~~LOC~~ SOPN: 10.0000 [IU] | PEN_INJECTOR | Freq: Three times a day (TID) | SUBCUTANEOUS | 5 refills | Status: DC

## 2017-03-11 MED ORDER — INSULIN DEGLUDEC 200 UNIT/ML ~~LOC~~ SOPN: 30.0000 [IU] | PEN_INJECTOR | Freq: Every day | SUBCUTANEOUS | 5 refills | Status: DC

## 2017-03-11 NOTE — Patient Instructions (Signed)
Type 1 Diabetes Mellitus, Diagnosis, Adult Type 1 diabetes (type 1 diabetes mellitus) is a long-term (chronic) disease. It occurs when the pancreas does not make enough of a hormone called insulin. Normally, insulin allows sugars (glucose) to enter cells in the body. The cells use glucose for energy. Lack of insulin causes excess glucose to build up in the blood instead of going into cells. As a result, high blood glucose (hyperglycemia) develops. The exact cause of type 1 diabetes is not known. There is currently no cure for type 1 diabetes, but it can be managed with insulin treatment and lifestyle changes. What increases the risk? You may be more likely to develop this condition if you have a family member who has type 1 diabetes. Other factors may also make you more likely to develop type 1 diabetes, such as:  Having a gene for type 1 diabetes that is passed along from parent to child (inherited).  Living in an area with cold weather conditions.  Exposure to certain viruses.  Certain conditions in which the body's disease-fighting (immune) system attacks itself (autoimmune disorders).  What are the signs or symptoms? Symptoms may develop gradually, over days or weeks, or they may develop suddenly. Symptoms may include:  Increased thirst (polydipsia).  Increased hunger(polyphagia).  Increased urination (polyuria).  Increased urination during the night (nocturia).  Sudden or unexplained weight changes.  Frequent infections that keep coming back (recurring).  Fatigue.  Weakness.  Vision changes, such as blurry vision.  Fruity-smelling breath.  Cuts or bruises that are slow to heal.  Tingling or numbness in the hands or feet.  How is this diagnosed?  This condition is diagnosed based on your symptoms, your medical history, a physical exam, and your blood glucose level. Your blood glucose may be checked with one or more of the following blood tests:  A fasting blood  glucose (FBG) test. You will not be allowed to eat (you will fast) for at least 8 hours before a blood sample is taken.  A random blood glucose test. This checks blood glucose at any time of day regardless of when you ate.  An A1c (hemoglobin A1c) blood test. This provides information about blood glucose control over the previous 2-3 months.  You may be diagnosed with type 1 diabetes if:  Your FBG level is 126 mg/dL (7.0 mmol/L) or higher.  Your random blood glucose level is 200 mg/dL (11.1 mmol/L) or higher.  Your A1c level is 6.5% or higher.  These blood tests may be repeated to confirm your diagnosis. How is this treated? Your treatment may be managed by a specialist called an endocrinologist. Type 1 diabetes can be managed by following instructions from your health care provider about:  Taking insulin daily. This helps to keep your blood glucose levels in the healthy range. ? You may need to adjust your insulin dosage based on how physically active you are and what foods you eat. Your health care provider will tell you how to do this.  Taking medicines to help prevent complications from diabetes, such as: ? Aspirin. ? Medicine to lower cholesterol. ? Medicine to control blood pressure.  Checking your blood glucose as often as directed.  Making diet and lifestyle changes. These may include: ? Following an individualized nutrition plan that is developed by a diet and nutrition specialist (registered dietitian). ? Exercising regularly. ? Finding ways to manage stress.  Your health care provider will set individualized treatment goals for you. Your goals will be based on  your age, other medical conditions you have, and how you respond to diabetes treatment. Generally, the goal of treatment is to maintain the following blood glucose levels:  Before meals (preprandial): 80-130 mg/dL (4.4-7.2 mmol/L).  After meals (postprandial): below 180 mg/dL (10 mmol/L).  A1c level: less than  7%.  Follow these instructions at home: Questions to [place] Provider   Consider asking the following questions: ? Do I need to meet with a diabetes educator? ? Should I consider joining a support group for people with diabetes? ? What equipment will I need to manage my diabetes at home? ? What diabetes medicines should I take, and when? ? How often should I check my blood glucose? ? What number should I call if I have questions? ? When is my next appointment? General instructions  Take over-the-counter and prescription medicines only as told by your health care provider.  Keep all follow-up visits as told by your health care provider. This is important.  For more information about diabetes, visit: ? American Diabetes Association (ADA): www.diabetes.org ? American Association of Diabetes Educators (AADE): www.diabeteseducator.org/patient-resources Contact a health care provider if:  Your blood glucose level is higher than 240 mg/dL (13.3 mmol/L) for 2 days in a row.  You have been sick or have had a fever for 2 days or more and you are not getting better.  You have any of the following problems for more than 6 hours: ? You cannot eat or drink. ? You have nausea and vomiting. ? You have diarrhea. Get help right away if:  Your blood glucose is below 54 mg/dL (3 mmol/L).  You become confused or you have trouble thinking clearly.  You have difficulty breathing.  You have moderate or large ketone levels in your urine. This information is not intended to replace advice given to you by your health care provider. Make sure you discuss any questions you have with your health care provider. Document Released: 07/25/2000 Document Revised: 01/03/2016 Document Reviewed: 08/31/2015 Elsevier Interactive Patient Education  2017 [employer].

## 2017-03-11 NOTE — Progress Notes (Signed)
Subjective:  Patient ID: [name], male    DOB: [birth year]  Age: 66 y.o. MRN: [id number]  CC: Diabetes   HPI [name] presents for f/up on IDDM - his blood sugars are lower but he is only using the basal insulin, he is not using the insulin at mealtime. His lowest BS has been 74, highest 326, most are in the 160-200 range.  His 14 day avg is 183. He wants his RX's sent to a specialty pharmacy at [facility].  Outpatient Medications Prior to Visit  Medication Sig Dispense Refill  . ACCU-CHEK SOFTCLIX LANCETS lancets 1 each by Other route 2 (two) times daily. Use to check blood sugars twice a day 100 each 3  . acetaminophen (TYLENOL) 500 MG tablet Take 1,000 mg by mouth.    Marland Kitchen amLODipine (NORVASC) 5 MG tablet Take 5 mg by mouth daily.    Marland Kitchen atorvastatin (LIPITOR) 10 MG tablet Take 10 mg by mouth daily.  7  . Blood Glucose Monitoring Suppl (ACCU-CHEK AVIVA PLUS) w/Device KIT Use as directed to check blood sugars 1 kit 0  . calcitRIOL (ROCALTROL) 0.5 MCG capsule Take 0.5 mcg by mouth daily.  11  . carvedilol (COREG) 25 MG tablet Take 37.5 mg by mouth 2 (two) times daily with a meal.     . glucose blood (ACCU-CHEK AVIVA PLUS) test strip 1 each by Other route 2 (two) times daily. 100 each 3  . glucose blood test strip Use TID 100 each 12  . Insulin Pen Needle (NOVOFINE) 32G X 6 MM MISC 1 Act by Does not apply route 4 (four) times daily. 100 each 11  . losartan (COZAAR) 50 MG tablet Take 50 mg by mouth daily.    . magnesium oxide (MAG-OX) 400 MG tablet Take 400 mg by mouth daily.    . mycophenolate (MYFORTIC) 180 MG EC tablet Take 540 mg by mouth 2 (two) times daily.    . tacrolimus (PROGRAF) 1 MG capsule Take 6-7 mg by mouth 2 (two) times daily. Take 40m in the morning and 680min the evening    . traMADol (ULTRAM) 50 MG tablet Take 50 mg by mouth every 6 (six) hours as needed for moderate pain.     . valGANciclovir (VALCYTE) 450 MG tablet Take 450 mg by mouth 3 (three) times a  week. Monday, Wednesday and Friday    . Vitamin D, Ergocalciferol, (DRISDOL) 50000 units CAPS capsule Take by mouth.    . Insulin Degludec (TRESIBA FLEXTOUCH) 200 UNIT/ML SOPN Inject 30 Units into the skin daily. 3 mL 5  . insulin regular human CONCENTRATED (HUMULIN R U-500 KWIKPEN) 500 UNIT/ML kwikpen Inject 10 Units into the skin 3 (three) times daily with meals. 3 mL 5   No facility-administered medications prior to visit.     ROS Review of Systems  Constitutional: Negative.  Negative for diaphoresis and fatigue.  HENT: Negative.   Eyes: Positive for visual disturbance.       He has had intermittent alterations in his [facility]  Respiratory: Negative for cough, chest tightness, shortness of breath and wheezing.   Cardiovascular: Negative.  Negative for chest pain, palpitations and leg swelling.  Gastrointestinal: Negative for abdominal pain, constipation, diarrhea, nausea and vomiting.  Endocrine: Negative for polydipsia, polyphagia and polyuria.  Musculoskeletal: Negative.   Skin: Negative.   Allergic/Immunologic: Negative.   Neurological: Negative.   Hematological: Negative for adenopathy. Does not bruise/bleed easily.  Psychiatric/Behavioral: Negative.     Objective:  BP  130/80 (BP Location: Left Arm, Patient Position: Sitting, Cuff Size: Normal)   Pulse 65   Temp 98.5 F (36.9 C) (Oral)   Resp 16   Ht _0  (1.778 m)   Wt 194 lb 8 oz (88.2 kg)   SpO2 98%   BMI 27.91 kg/m   BP Readings from Last 3 Encounters:  03/11/17 130/80  02/25/17 124/84  05/12/16 110/74    Wt Readings from Last 3 Encounters:  03/11/17 194 lb 8 oz (88.2 kg)  02/25/17 190 lb (86.2 kg)  05/12/16 207 lb 4 oz (94 kg)    Physical Exam  Constitutional: He is oriented to person, place, and time. No distress.  HENT:  Mouth/Throat: Oropharynx is clear and moist. No oropharyngeal exudate.  Eyes: Conjunctivae are normal. Right eye exhibits no discharge. Left eye exhibits no discharge. No scleral  icterus.  Neck: Normal range of motion. Neck supple. No JVD present. No thyromegaly present.  Cardiovascular: Normal rate, regular rhythm and intact distal pulses.  Exam reveals no gallop.   No murmur heard. Pulmonary/Chest: Effort normal and breath sounds normal. No respiratory distress. He has no wheezes. He has no rales. He exhibits no tenderness.  Abdominal: Soft. Bowel sounds are normal. He exhibits no distension and no mass. There is no tenderness. There is no guarding.  Musculoskeletal: Normal range of motion. He exhibits no edema, tenderness or deformity.  Lymphadenopathy:    He has no cervical adenopathy.  Neurological: He is alert and oriented to person, place, and time.  Skin: Skin is warm and dry. No rash noted. He is not diaphoretic. No erythema. No pallor.  Vitals reviewed.   Lab Results  Component Value Date   WBC 3.8 02/23/2017   HGB 14.7 02/23/2017   HCT 44 02/23/2017   PLT 138 (A) 02/23/2017   GLUCOSE 139 (H) 06/28/2014   CHOL 203 (A) 02/23/2017   TRIG 126 02/23/2017   HDL 66 02/23/2017   LDLCALC 112 02/23/2017   ALT 12 02/23/2017   AST 16 02/23/2017   NA 138 02/23/2017   K 4.1 02/23/2017   CL 104 06/28/2014   CREATININE 1.8 (A) 02/23/2017   BUN 19 02/23/2017   CO2 22 06/28/2014   TSH 1.34 07/30/2011   PSA 0.46 07/30/2011   INR 1.08 06/28/2014   HGBA1C 15 02/23/2017    US Renal Transplant W/doppler  Result Date: 11/19/2016 CLINICAL DATA:  Chronic kidney disease stage 3, right transplant kidney 2015. EXAM: ULTRASOUND OF RENAL TRANSPLANT WITH RENAL DOPPLER ULTRASOUND TECHNIQUE: Ultrasound examination of the renal transplant was performed with gray-scale, color and duplex doppler evaluation. COMPARISON:  09/08/2010 FINDINGS: Transplant kidney location: Right lower quadrant Transplant Kidney: Length: 11.7 cm. Normal in size and parenchymal echogenicity. No evidence of mass or hydronephrosis. No peri-transplant fluid collection seen. Color flow in the main renal  artery:  Yes Color flow in the main renal vein:  Yes Duplex Doppler Evaluation: Main Renal Artery Resistive Index: Not obtained Venous waveform in main renal vein:  Present Intrarenal resistive index in upper pole:  0.69 (normal 0.6-0.8; equivocal 0.8-0.9; abnormal >= 0.9) Intrarenal resistive index in lower pole: 0.78 (normal 0.6-0.8; equivocal 0.8-0.9; abnormal >= 0.9) Bladder: Normal for degree of bladder distention. Right ureteral jet documented. Other findings: Small echogenic native kidneys right 8.1 cm, left 7.3 cm. 1.4 cm cystic lesion, right upper pole. No hydronephrosis. IMPRESSION: 1. Normal Doppler ultrasound assessment of right lower quadrant renal transplant. 2. Echogenic small native kidneys with 1.4 cm right upper pole  renal cystic lesion, not clearly evident on prior study of 09/08/2010. Electronically Signed   By: Lucrezia Europe M.D.   On: 11/19/2016 08:50    Assessment & Plan:   Siddharth was seen today for diabetes.  Diagnoses and all orders for this visit:  Uncontrolled type 1 diabetes mellitus with stage 4 chronic kidney disease ([place])- his blood sugars have improved on the basal insulin, will try to get better control with regular insulin with meals -     POCT Glucose (Device for Home Use) -     Insulin Degludec (TRESIBA FLEXTOUCH) 200 UNIT/ML SOPN; Inject 30 Units into the skin daily. -     insulin regular human CONCENTRATED (HUMULIN R U-500 KWIKPEN) 500 UNIT/ML kwikpen; Inject 10 Units into the skin 3 (three) times daily with meals.   I am having Mr. Halderman maintain his carvedilol, traMADol, magnesium oxide, mycophenolate, tacrolimus, valGANciclovir, amLODipine, acetaminophen, Vitamin D (Ergocalciferol), atorvastatin, calcitRIOL, losartan, glucose blood, Insulin Pen Needle, glucose blood, ACCU-CHEK AVIVA PLUS, ACCU-CHEK SOFTCLIX LANCETS, Insulin Degludec, and insulin regular human CONCENTRATED.  Meds ordered this encounter  Medications  . Insulin Degludec (TRESIBA FLEXTOUCH) 200  UNIT/ML SOPN    Sig: Inject 30 Units into the skin daily.    Dispense:  3 mL    Refill:  5  . insulin regular human CONCENTRATED (HUMULIN R U-500 KWIKPEN) 500 UNIT/ML kwikpen    Sig: Inject 10 Units into the skin 3 (three) times daily with meals.    Dispense:  3 mL    Refill:  5     Follow-up: Return in about 3 months (around 06/11/2017).  Scarlette Calico, MD

## 2017-03-12 ENCOUNTER — Telehealth: Payer: Self-pay

## 2017-03-12 NOTE — Telephone Encounter (Signed)
LVM for patient to call back to try an clarify pharmacy for Rx to be sent to, could not find the requested one in our system. Requesting patient give [place] at least a phone number to the requested pharmacy so we can call and get a fax number

## 2017-03-13 ENCOUNTER — Encounter: Payer: Self-pay | Admitting: Internal Medicine

## 2017-03-24 NOTE — Telephone Encounter (Signed)
Printed rx is in the cabinet in the front.

## 2017-03-29 DIAGNOSIS — M940 Chondrocostal junction syndrome [Tietze]: Secondary | ICD-10-CM | POA: Diagnosis not present

## 2017-03-29 DIAGNOSIS — R252 Cramp and spasm: Secondary | ICD-10-CM | POA: Diagnosis not present

## 2017-03-29 DIAGNOSIS — S138XXA Sprain of joints and ligaments of other parts of neck, initial encounter: Secondary | ICD-10-CM | POA: Diagnosis not present

## 2017-04-03 DIAGNOSIS — Z87448 Personal history of other diseases of urinary system: Secondary | ICD-10-CM | POA: Diagnosis not present

## 2017-04-03 DIAGNOSIS — N3001 Acute cystitis with hematuria: Secondary | ICD-10-CM | POA: Diagnosis not present

## 2017-04-03 DIAGNOSIS — N35014 Post-traumatic urethral stricture, male, unspecified: Secondary | ICD-10-CM | POA: Diagnosis not present

## 2017-04-06 DIAGNOSIS — M47816 Spondylosis without myelopathy or radiculopathy, lumbar region: Secondary | ICD-10-CM | POA: Diagnosis not present

## 2017-04-06 DIAGNOSIS — Q7649 Other congenital malformations of spine, not associated with scoliosis: Secondary | ICD-10-CM | POA: Diagnosis not present

## 2017-04-06 DIAGNOSIS — M5031 Other cervical disc degeneration,  high cervical region: Secondary | ICD-10-CM | POA: Diagnosis not present

## 2017-04-06 DIAGNOSIS — M5127 Other intervertebral disc displacement, lumbosacral region: Secondary | ICD-10-CM | POA: Diagnosis not present

## 2017-04-06 DIAGNOSIS — M2578 Osteophyte, vertebrae: Secondary | ICD-10-CM | POA: Diagnosis not present

## 2017-04-06 DIAGNOSIS — M419 Scoliosis, unspecified: Secondary | ICD-10-CM | POA: Diagnosis not present

## 2017-04-06 DIAGNOSIS — M5126 Other intervertebral disc displacement, lumbar region: Secondary | ICD-10-CM | POA: Diagnosis not present

## 2017-04-08 ENCOUNTER — Ambulatory Visit: Payer: BLUE CROSS/BLUE SHIELD | Admitting: Endocrinology

## 2017-04-27 NOTE — Progress Notes (Signed)
Patient ID: [name], male   DOB: 04/10/1951, 66 y.o.   MRN: [id number]          Reason for Appointment: Consultation for Type 2 Diabetes  Referring physician: Scarlette Calico   History of Present Illness:          Date of diagnosis of type 2 diabetes mellitus: ?  2004        Background history:   Apparently the patient had hyperglycemia in 2004 but details are not available.  He was treated with some oral medications and diet and this apparently resolved soon without any recurrence until recently  Recent history:   The patient was starting to have weight loss, increased thirst, increased urination and blurred vision in June and his glucose was 388 done by his nephrologist along with an A1c of 15  INSULIN regimen is: [place]  30 units daily, Humulin R U-500 insulin 10 units 3 times a day      Non-insulin hypoglycemic drugs the patient is taking are: None  Current management, blood sugar patterns and problems identified:   He has been on insulin since July started by his PCP  He takes [place] once a day, 30 units and also takes Humulin R U-500 insulin 3 times a day  He said that he is taking the Humulin R late morning, early evening and at bedtime without any correlation with his mealtimes  Checking blood sugars mostly in the morning and some before and after supper  His meal times vary from day-to-day and he has different types of foods and carbohydrate content is inconsistent  Although he is now cutting back on regular soft drinks and other drinks which sugar he does not restrict his carbohydrates or snacks   Hypoglycemia:   at times he will feel a little shaky and weak and this may be frequently around 5-6 PM  Side effects from medications have been:None  Compliance with the medical regimen:Fair   Glucose monitoring:  done 1.7  times a day         Glucometer: Accucheck     Blood Glucose readings by time of day and averages from meter download:  PREMEAL  Breakfast Lunch Dinner Bedtime  Overall   Glucose range:  98-1 79   81-233  65-2 08    Median:     152+/-43   POST-MEAL PC Breakfast PC Lunch PC Dinner  Glucose range:  1 83-253  153    Median:      Self-care: The diet that the patient has been following is: tries to limit carbs.     Meal times are: Dinner: 8-9P.m.  Typical meal intake: Breakfast iseggs, oatmeal, sausage, bacon                Dietician visit, most recent:2013                Exercise:  None, he thinks he is active during the day  Weight history:  maximum weight previously 240  Wt Readings from Last 3 Encounters:  04/28/17 211 lb (95.7 kg)  03/11/17 194 lb 8 oz (88.2 kg)  02/25/17 190 lb (86.2 kg)    Glycemic control:   Lab Results  Component Value Date   HGBA1C 15 02/23/2017   HGBA1C 4.3 (L) 07/30/2011   HGBA1C  09/08/2010    5.3 (NOTE)  According to the ADA Clinical Practice Recommendations for 2011, when HbA1c is used as a screening test:   >=6.5%   Diagnostic of Diabetes Mellitus           (if abnormal result  is confirmed)  5.7-6.4%   Increased risk of developing Diabetes Mellitus  References:Diagnosis and Classification of Diabetes Mellitus,Diabetes GUYQ,0347,42(VZDGL 1):S62-S69 and Standards of Medical Care in         Diabetes - 2011,Diabetes [id number],4332,95  (Suppl 1):S11-S61.   Lab Results  Component Value Date   LDLCALC 112 02/23/2017   CREATININE 1.8 (A) 02/23/2017   No results found for: MICRALBCREAT  No results found for: FRUCTOSAMINE    Allergies as of 04/28/2017      Reactions   Hydrocodone Itching      Medication List       Accurate as of 04/28/17 10:43 AM. Always use your most recent med list.          ACCU-CHEK AVIVA PLUS w/Device Kit Use as directed to check blood sugars   ACCU-CHEK SOFTCLIX LANCETS lancets 1 each by Other route 2 (two) times daily. Use to check blood sugars twice a day   acetaminophen 500  MG tablet Commonly known as:  TYLENOL Take 1,000 mg by mouth.   amLODipine 5 MG tablet Commonly known as:  NORVASC Take 5 mg by mouth daily.   atorvastatin 10 MG tablet Commonly known as:  LIPITOR Take 10 mg by mouth daily.   calcitRIOL 0.5 MCG capsule Commonly known as:  ROCALTROL Take 0.5 mcg by mouth daily.   carvedilol 25 MG tablet Commonly known as:  COREG Take 37.5 mg by mouth 2 (two) times daily with a meal.   glucose blood test strip Use TID   glucose blood test strip Commonly known as:  ACCU-CHEK AVIVA PLUS 1 each by Other route 2 (two) times daily.   Insulin Degludec 200 UNIT/ML Sopn Commonly known as:  TRESIBA FLEXTOUCH Inject 30 Units into the skin daily.   insulin lispro 100 UNIT/ML KiwkPen Commonly known as:  HUMALOG KWIKPEN 15 units before meals   Insulin Pen Needle 32G X 6 MM Misc Commonly known as:  NOVOFINE 1 Act by Does not apply route 4 (four) times daily.   insulin regular human CONCENTRATED 500 UNIT/ML kwikpen Commonly known as:  HUMULIN R U-500 KWIKPEN Inject 10 Units into the skin 3 (three) times daily with meals.   losartan 50 MG tablet Commonly known as:  COZAAR Take 50 mg by mouth daily.   magnesium oxide 400 MG tablet Commonly known as:  MAG-OX Take 400 mg by mouth daily.   mycophenolate 180 MG EC tablet Commonly known as:  MYFORTIC Take 540 mg by mouth 2 (two) times daily.   tacrolimus 1 MG capsule Commonly known as:  PROGRAF Take 6-7 mg by mouth 2 (two) times daily. Take 35m in the morning and 652min the evening   valGANciclovir 450 MG tablet Commonly known as:  VALCYTE Take 450 mg by mouth 3 (three) times a week. Monday, Wednesday and Friday   Vitamin D (Ergocalciferol) 50000 units Caps capsule Commonly known as:  DRISDOL Take by mouth.            Discharge Care Instructions        Start     Ordered   04/28/17 0000  insulin lispro (HUMALOG KWIKPEN) 100 UNIT/ML KiwkPen     04/28/17 1002      Allergies:    Allergies  Allergen Reactions  .  Hydrocodone Itching    Past Medical History:  Diagnosis Date  . Anemia   . Anemia   . Benign colon polyp 2012  . Clot    STATED TOLD IN APRIL CLOT TO LEFT SHOULDER. DOCTOR MADE AWARE  . Gout   . Headache(784.0)   . 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 HTN (hypertension)   . Hyperlipidemia   . PONV (postoperative nausea and vomiting)   . Renal failure     Past Surgical History:  Procedure Laterality Date  . AV FISTULA PLACEMENT  01/19/2012   Procedure: ARTERIOVENOUS (AV) FISTULA CREATION;  Surgeon: Conrad Sweet Grass, MD;  Location: [facility];  Service: Vascular;  Laterality: Right;  Right Brachio-cephalic arteriovenous fistula  . AV FISTULA PLACEMENT, RADIOCEPHALIC  69/67/89   Right arm  . INSERTION OF DIALYSIS CATHETER  01/19/2012   Procedure: INSERTION OF DIALYSIS CATHETER;  Surgeon: Conrad Midway, MD;  Location: [facility];  Service: Vascular;  Laterality: N/A;  right internal jugular vein  . 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    Family History  Problem Relation Age of Onset  . Diabetes Father   . Colon cancer Neg Hx   . Esophageal cancer Neg Hx   . Stomach cancer Neg Hx   . Rectal cancer Neg Hx     Social History:  reports that he has never smoked. He has never used smokeless tobacco. He reports that he does not drink alcohol or use drugs.   Review of Systems  Constitutional: Positive for weight gain. Negative for reduced appetite.  HENT: Negative for trouble swallowing.   Respiratory: Negative for shortness of breath.   Cardiovascular: Negative for leg swelling.       Has off and on discomfort in the chest since his accident a month ago  Gastrointestinal: Negative for diarrhea and abdominal pain.  Endocrine: Negative for fatigue and polydipsia.  Genitourinary: Negative for nocturia.  Musculoskeletal: Negative for joint pain.  Skin: Negative for rash.  Neurological:       He has a mild feeling of tingling or  numbness in his feet for some time, no sharp pains  Psychiatric/Behavioral: Negative for insomnia.     Lipid history: His PCP has started him on Lipitor 10 mg daily    Lab Results  Component Value Date   CHOL 203 (A) 02/23/2017   HDL 66 02/23/2017   LDLCALC 112 02/23/2017   TRIG 126 02/23/2017   CHOLHDL 3.2 09/08/2010           Hypertension: since 2003, Followed by nephrologist  Apparently has had end-stage renal disease related to hypertension in the past, transplant done in 2015  Most recent eye exam was 7/18  Most recent foot exam: 9/18    LABS:  No visits with results within 1 Week(s) from this visit.  Latest known visit with results is:  Documentation on 03/13/2017  Component Date Value Ref Range Status  . HM Diabetic Eye Exam 03/07/2017 No Retinopathy  No Retinopathy Final    Physical Examination:  BP 132/84   Pulse 71   Ht '5\' 10"'  (1.778 m)   Wt 211 lb (95.7 kg)   SpO2 96%   BMI 30.28 kg/m   GENERAL:         Patient has mild generalized obesity.   HEENT:         Eye exam shows normal external appearance. Fundus examDeferred, recently had eye exam  Oral exam shows normal mucosa .  NECK:   There is no lymphadenopathy Thyroid is not enlarged and no nodules felt.  Carotids are normal to palpation and no bruit heard.  Probably transmitted murmur heard on the left side  LUNGS:         Chest is symmetrical. Lungs are clear to auscultation.Marland Kitchen   HEART:         Heart sounds:  S1 and S2 are normal.  Soft blowing 2/6 ejection systolic murmur heard.  At the upper left sternal border, no S3 or S4 .   ABDOMEN:   There is no distention present. Liver and spleen are not palpable. No other mass or tenderness present.   NEUROLOGICAL:   Ankle jerks are absent bilaterally.    Diabetic Foot Exam - Simple   Simple Foot Form Diabetic Foot exam was performed with the following findings:  Yes   Visual Inspection No deformities, no ulcerations, no other skin breakdown  bilaterally:  Yes Sensation Testing Intact to touch and monofilament testing bilaterally:  Yes Pulse Check See comments:  Yes Comments Practically absent pedal pulses            Vibration sense is Mildly reduced in distal first toes. MUSCULOSKELETAL:  There is no swelling or deformity of the peripheral joints.   EXTREMITIES:     There is no edema. No skin lesions present.Marland Kitchen SKIN:       No rash or lesions of concern.        ASSESSMENT:  Diabetes type 2, uncontrolled     Although he was recently diagnosed with diabetes several years ago he presented in July with marked hyperglycemia and A1c of 15 This indicates insulin deficiency but does not have the features of type I diabetes with no evidence of ketosis at onset despite marked symptomatic hyperglycemia Currently on basal insulin and U-500 insulin; he is taking the-500 insulin somewhat inappropriately at random times and also at bedtime With this he is getting some postprandial peaks in his blood sugars especially when he is not covering his breakfast with her mealtime insulin His diet is variable and can be improved He does need more diabetes education and he is interested in learning more about it  Today discussed basic principles of insulin management and treatment including differences between basal and bolus insulin and timing of insulins as well as blood sugar targets both fasting and postprandial Discussed effects of various nutrients on blood sugars including carbohydrates and fats as well as need for balanced meals  Complications of diabetes: Mild neuropathy  Mild hypercholesterolemia: Needs follow-up while on treatment  PLAN:     Stop Humulin R U-500 insulin  HUMALOG before each meal, most likely can take 10-15 units based on size of meal  He will need to check readings after meals consistently to help titrate the mealtime insulin doses  With stopping the Humulin R the evening he most likely may need higher doses of  Tresiba and he can try to do this on his own on a weekly basis if fasting readings stay consistently over 140  Discussed use of the phone app for his glucose monitor that he can use to keep up with his blood sugars and patterns  Consultation with dietitian/nurse educator  Follow-up in 4 weeks   Patient Instructions  Check blood sugars on waking up  daily  Also check blood sugars about 2 hours after a meal and do this after different meals by rotation  Recommended blood sugar levels on waking up is 90-130 and  about 2 hours after meal is 130-160  Please bring your blood sugar monitor to each visit, thank you   Stop Humulin R insulin  HUMALOG insulin: Take this 5-10 minutes before eating, this is a fast acting insulin lasting 4 hours  Adjust the dose based on meal size usually between 10-15 units but for very small meals or less carbohydrate may reduce it down to 6 units  If am sugar in am on waking up stays >140 increase TRESIBA by 2 units at least on a weekly basis     Consultation note has been sent to the referring physician  Counseling time on subjects discussed in assessment and plan sections is over 50% of today's 60 minute visit   Carrah Eppolito 04/28/2017, 10:43 AM   Note: This office note was prepared with Dragon voice recognition system technology. Any transcriptional errors that result from this process are unintentional.

## 2017-04-28 ENCOUNTER — Ambulatory Visit (INDEPENDENT_AMBULATORY_CARE_PROVIDER_SITE_OTHER): Payer: BLUE CROSS/BLUE SHIELD | Admitting: Endocrinology

## 2017-04-28 ENCOUNTER — Encounter: Payer: Self-pay | Admitting: Endocrinology

## 2017-04-28 VITALS — BP 132/84 | HR 71 | Ht 70.0 in | Wt 211.0 lb

## 2017-04-28 DIAGNOSIS — E782 Mixed hyperlipidemia: Secondary | ICD-10-CM | POA: Diagnosis not present

## 2017-04-28 DIAGNOSIS — Z794 Long term (current) use of insulin: Secondary | ICD-10-CM | POA: Diagnosis not present

## 2017-04-28 DIAGNOSIS — E1165 Type 2 diabetes mellitus with hyperglycemia: Secondary | ICD-10-CM | POA: Diagnosis not present

## 2017-04-28 MED ORDER — INSULIN LISPRO 100 UNIT/ML (KWIKPEN): PEN_INJECTOR | SUBCUTANEOUS | 1 refills | Status: DC

## 2017-04-28 NOTE — Patient Instructions (Addendum)
Check blood sugars on waking up  daily  Also check blood sugars about 2 hours after a meal and do this after different meals by rotation  Recommended blood sugar levels on waking up is 90-130 and about 2 hours after meal is 130-160  Please bring your blood sugar monitor to each visit, thank you   Stop Humulin R insulin  HUMALOG insulin: Take this 5-10 minutes before eating, this is a fast acting insulin lasting 4 hours  Adjust the dose based on meal size usually between 10-15 units but for very small meals or less carbohydrate may reduce it down to 6 units  If am sugar in am on waking up stays >140 increase TRESIBA by 2 units at least on a weekly basis

## 2017-05-01 DIAGNOSIS — N35014 Post-traumatic urethral stricture, male, unspecified: Secondary | ICD-10-CM | POA: Diagnosis not present

## 2017-05-07 ENCOUNTER — Ambulatory Visit (INDEPENDENT_AMBULATORY_CARE_PROVIDER_SITE_OTHER): Payer: BLUE CROSS/BLUE SHIELD | Admitting: Internal Medicine

## 2017-05-07 ENCOUNTER — Other Ambulatory Visit (INDEPENDENT_AMBULATORY_CARE_PROVIDER_SITE_OTHER): Payer: BLUE CROSS/BLUE SHIELD

## 2017-05-07 ENCOUNTER — Ambulatory Visit (INDEPENDENT_AMBULATORY_CARE_PROVIDER_SITE_OTHER)
Admission: RE | Admit: 2017-05-07 | Discharge: 2017-05-07 | Disposition: A | Discharge status: Home / Self Care | Payer: BLUE CROSS/BLUE SHIELD | Source: Ambulatory Visit | Attending: Internal Medicine | Admitting: Internal Medicine

## 2017-05-07 ENCOUNTER — Encounter: Payer: Self-pay | Admitting: Internal Medicine

## 2017-05-07 VITALS — BP 100/58 | HR 75 | Temp 97.8°F | Resp 16 | Ht 70.0 in | Wt 210.5 lb

## 2017-05-07 DIAGNOSIS — I358 Other nonrheumatic aortic valve disorders: Secondary | ICD-10-CM

## 2017-05-07 DIAGNOSIS — R0789 Other chest pain: Secondary | ICD-10-CM | POA: Diagnosis not present

## 2017-05-07 DIAGNOSIS — R61 Generalized hyperhidrosis: Secondary | ICD-10-CM | POA: Diagnosis not present

## 2017-05-07 DIAGNOSIS — R739 Hyperglycemia, unspecified: Secondary | ICD-10-CM

## 2017-05-07 DIAGNOSIS — E118 Type 2 diabetes mellitus with unspecified complications: Secondary | ICD-10-CM | POA: Diagnosis not present

## 2017-05-07 DIAGNOSIS — R079 Chest pain, unspecified: Secondary | ICD-10-CM | POA: Diagnosis not present

## 2017-05-07 LAB — CBC WITH DIFFERENTIAL/PLATELET
BASOS ABS: 0 10*3/uL (ref 0.0–0.1)
Basophils Relative: 0.6 % (ref 0.0–3.0)
EOS ABS: 0.1 10*3/uL (ref 0.0–0.7)
Eosinophils Relative: 2.4 % (ref 0.0–5.0)
HCT: 43.9 % (ref 39.0–52.0)
HEMOGLOBIN: 14 g/dL (ref 13.0–17.0)
LYMPHS ABS: 1.1 10*3/uL (ref 0.7–4.0)
Lymphocytes Relative: 27.5 % (ref 12.0–46.0)
MCHC: 31.8 g/dL (ref 30.0–36.0)
MCV: 81.2 fl (ref 78.0–100.0)
MONO ABS: 0.3 10*3/uL (ref 0.1–1.0)
Monocytes Relative: 7.8 % (ref 3.0–12.0)
NEUTROS PCT: 61.7 % (ref 43.0–77.0)
Neutro Abs: 2.5 10*3/uL (ref 1.4–7.7)
Platelets: 134 10*3/uL — ABNORMAL LOW (ref 150.0–400.0)
RBC: 5.41 Mil/uL (ref 4.22–5.81)
RDW: 13.8 % (ref 11.5–15.5)
WBC: 4.1 10*3/uL (ref 4.0–10.5)

## 2017-05-07 LAB — COMPREHENSIVE METABOLIC PANEL
ALBUMIN: 4.1 g/dL (ref 3.5–5.2)
ALT: 11 U/L (ref 0–53)
AST: 18 U/L (ref 0–37)
Alkaline Phosphatase: 72 U/L (ref 39–117)
BILIRUBIN TOTAL: 0.7 mg/dL (ref 0.2–1.2)
BUN: 23 mg/dL (ref 6–23)
CALCIUM: 9.9 mg/dL (ref 8.4–10.5)
CHLORIDE: 104 meq/L (ref 96–112)
CO2: 31 mEq/L (ref 19–32)
CREATININE: 2.05 mg/dL — AB (ref 0.40–1.50)
GFR: 42.22 mL/min — ABNORMAL LOW (ref >60.00)
Glucose, Bld: 225 mg/dL — ABNORMAL HIGH (ref 70–99)
Potassium: 3.9 mEq/L (ref 3.5–5.1)
SODIUM: 141 meq/L (ref 135–145)
Total Protein: 6.7 g/dL (ref 6.0–8.3)

## 2017-05-07 LAB — SEDIMENTATION RATE: SED RATE: 4 mm/h (ref 0–20)

## 2017-05-07 LAB — HEMOGLOBIN A1C: HEMOGLOBIN A1C: 8.3 % — AB (ref 4.6–6.5)

## 2017-05-07 NOTE — Patient Instructions (Signed)
Nonspecific Chest Pain °Chest pain can be caused by many different conditions. There is always a chance that your pain could be related to something serious, such as a heart attack or a blood clot in your lungs. Chest pain can also be caused by conditions that are not life-threatening. If you have chest pain, it is very important to follow up with your health care provider. °What are the causes? °Causes of this condition include: °· Heartburn. °· Pneumonia or bronchitis. °· Anxiety or stress. °· Inflammation around your heart (pericarditis) or lung (pleuritis or pleurisy). °· A blood clot in your lung. °· A collapsed lung (pneumothorax). This can develop suddenly on its own (spontaneous pneumothorax) or from trauma to the chest. °· Shingles infection (varicella-zoster virus). °· Heart attack. °· Damage to the bones, muscles, and cartilage that make up your chest wall. This can include: °? Bruised bones due to injury. °? Strained muscles or cartilage due to frequent or repeated coughing or overwork. °? Fracture to one or more ribs. °? Sore cartilage due to inflammation (costochondritis). ° °What increases the risk? °Risk factors for this condition may include: °· Activities that increase your risk for trauma or injury to your chest. °· Respiratory infections or conditions that cause frequent coughing. °· Medical conditions or overeating that can cause heartburn. °· Heart disease or family history of heart disease. °· Conditions or health behaviors that increase your risk of developing a blood clot. °· Having had chicken pox (varicella zoster). ° °What are the signs or symptoms? °Chest pain can feel like: °· Burning or tingling on the surface of your chest or deep in your chest. °· Crushing, pressure, aching, or squeezing pain. °· Dull or sharp pain that is worse when you move, cough, or take a deep breath. °· Pain that is also felt in your back, neck, shoulder, or arm, or pain that spreads to any of these  areas. ° °Your chest pain may come and go, or it may stay constant. °How is this diagnosed? °Lab tests or other studies may be needed to find the cause of your pain. Your health care provider may have you take a test called an ECG (electrocardiogram). An ECG records your heartbeat patterns at the time the test is performed. You may also have other tests, such as: °· Transthoracic echocardiogram (TTE). In this test, sound waves are used to create a picture of the heart structures and to look at how blood flows through your heart. °· Transesophageal echocardiogram (TEE). This is a more advanced imaging test that takes images from inside your body. It allows your health care provider to see your heart in finer detail. °· Cardiac monitoring. This allows your health care provider to monitor your heart rate and rhythm in real time. °· Holter monitor. This is a portable device that records your heartbeat and can help to diagnose abnormal heartbeats. It allows your health care provider to track your heart activity for several days, if needed. °· Stress tests. These can be done through exercise or by taking medicine that makes your heart beat more quickly. °· Blood tests. °· Other imaging tests. ° °How is this treated? °Treatment depends on what is causing your chest pain. Treatment may include: °· Medicines. These may include: °? Acid blockers for heartburn. °? Anti-inflammatory medicine. °? Pain medicine for inflammatory conditions. °? Antibiotic medicine, if an infection is present. °? Medicines to dissolve blood clots. °? Medicines to treat coronary artery disease (CAD). °· Supportive care for conditions that   do not require medicines. This may include: °? Resting. °? Applying heat or cold packs to injured areas. °? Limiting activities until pain decreases. ° °Follow these instructions at home: °Medicines °· If you were prescribed an antibiotic, take it as told by your health care provider. Do not stop taking the  antibiotic even if you start to feel better. °· Take over-the-counter and prescription medicines only as told by your health care provider. °Lifestyle °· Do not use any products that contain nicotine or tobacco, such as cigarettes and e-cigarettes. If you need help quitting, ask your health care provider. °· Do not drink alcohol. °· Make lifestyle changes as directed by your health care provider. These may include: °? Getting regular exercise. Ask your health care provider to suggest some activities that are safe for you. °? Eating a heart-healthy diet. A registered dietitian can help you to learn healthy eating options. °? Maintaining a healthy weight. °? Managing diabetes, if necessary. °? Reducing stress, such as with yoga or relaxation techniques. °General instructions °· Avoid any activities that bring on chest pain. °· If heartburn is the cause for your chest pain, raise (elevate) the head of your bed about 6 inches (15 cm) by putting blocks under the legs. Sleeping with more pillows does not effectively relieve heartburn because it only changes the position of your head. °· Keep all follow-up visits as told by your health care provider. This is important. This includes any further testing if your chest pain does not go away. °Contact a health care provider if: °· Your chest pain does not go away. °· You have a rash with blisters on your chest. °· You have a fever. °· You have chills. °Get help right away if: °· Your chest pain is worse. °· You have a cough that gets worse, or you cough up blood. °· You have severe pain in your abdomen. °· You have severe weakness. °· You faint. °· You have sudden, unexplained chest discomfort. °· You have sudden, unexplained discomfort in your arms, back, neck, or jaw. °· You have shortness of breath at any time. °· You suddenly start to sweat, or your skin gets clammy. °· You feel nauseous or you vomit. °· You suddenly feel light-headed or dizzy. °· Your heart begins to beat  quickly, or it feels like it is skipping beats. °These symptoms may represent a serious problem that is an emergency. Do not wait to see if the symptoms will go away. Get medical help right away. Call your local emergency services (911 in the [place] Do not drive yourself to the hospital. °This information is not intended to replace advice given to you by your health care provider. Make sure you discuss any questions you have with your health care provider. °Document Released: 05/07/2005 Document Revised: 04/21/2016 Document Reviewed: 04/21/2016 °Elsevier Interactive Patient Education © 2017 [employer] ° °
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
## 2017-05-08 ENCOUNTER — Telehealth: Payer: Self-pay | Admitting: Internal Medicine

## 2017-05-08 NOTE — Telephone Encounter (Signed)
Patient has been informed of labs. He understood. He has an appointment with Endo next week.
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
## 2017-05-11 ENCOUNTER — Encounter: Payer: Self-pay | Admitting: Cardiology

## 2017-05-11 ENCOUNTER — Ambulatory Visit (INDEPENDENT_AMBULATORY_CARE_PROVIDER_SITE_OTHER): Payer: BLUE CROSS/BLUE SHIELD | Admitting: Cardiology

## 2017-05-11 VITALS — BP 130/84 | HR 68 | Ht 70.0 in | Wt 211.0 lb

## 2017-05-11 DIAGNOSIS — R011 Cardiac murmur, unspecified: Secondary | ICD-10-CM

## 2017-05-11 DIAGNOSIS — N183 Chronic kidney disease, stage 3 unspecified: Secondary | ICD-10-CM

## 2017-05-11 DIAGNOSIS — I10 Essential (primary) hypertension: Secondary | ICD-10-CM | POA: Diagnosis not present

## 2017-05-11 DIAGNOSIS — E118 Type 2 diabetes mellitus with unspecified complications: Secondary | ICD-10-CM | POA: Diagnosis not present

## 2017-05-11 NOTE — Patient Instructions (Signed)
Follow up if needed

## 2017-05-12 DIAGNOSIS — Z940 Kidney transplant status: Secondary | ICD-10-CM | POA: Diagnosis not present

## 2017-05-12 DIAGNOSIS — N35911 Unspecified urethral stricture, male, meatal: Secondary | ICD-10-CM | POA: Diagnosis not present

## 2017-05-18 ENCOUNTER — Other Ambulatory Visit (INDEPENDENT_AMBULATORY_CARE_PROVIDER_SITE_OTHER): Payer: BLUE CROSS/BLUE SHIELD

## 2017-05-18 DIAGNOSIS — Z794 Long term (current) use of insulin: Secondary | ICD-10-CM | POA: Diagnosis not present

## 2017-05-18 DIAGNOSIS — E1165 Type 2 diabetes mellitus with hyperglycemia: Secondary | ICD-10-CM | POA: Diagnosis not present

## 2017-05-18 DIAGNOSIS — E782 Mixed hyperlipidemia: Secondary | ICD-10-CM | POA: Diagnosis not present

## 2017-05-18 LAB — BASIC METABOLIC PANEL
BUN: 25 mg/dL — AB (ref 6–23)
CHLORIDE: 108 meq/L (ref 96–112)
CO2: 27 meq/L (ref 19–32)
Calcium: 9.8 mg/dL (ref 8.4–10.5)
Creatinine, Ser: 2.04 mg/dL — ABNORMAL HIGH (ref 0.40–1.50)
GFR: 42.46 mL/min — ABNORMAL LOW (ref >60.00)
GLUCOSE: 105 mg/dL — AB (ref 70–99)
POTASSIUM: 3.7 meq/L (ref 3.5–5.1)
Sodium: 142 mEq/L (ref 135–145)

## 2017-05-18 LAB — LDL CHOLESTEROL, DIRECT: Direct LDL: 75 mg/dL

## 2017-05-19 DIAGNOSIS — H2513 Age-related nuclear cataract, bilateral: Secondary | ICD-10-CM | POA: Diagnosis not present

## 2017-05-19 DIAGNOSIS — H401131 Primary open-angle glaucoma, bilateral, mild stage: Secondary | ICD-10-CM | POA: Diagnosis not present

## 2017-05-19 LAB — FRUCTOSAMINE: FRUCTOSAMINE: 315 umol/L — AB (ref 0–285)

## 2017-05-20 NOTE — Progress Notes (Signed)
Patient ID: [name], male   DOB: [birth year], 66 y.o.   MRN: [id number]          Reason for Appointment: Follow-up for Type 2 Diabetes  Referring physician: Scarlette Calico   History of Present Illness:          Date of diagnosis of type 2 diabetes mellitus: ?  2004        Background history:   Apparently the patient had hyperglycemia in 2004 but details are not available.  He was treated with some oral medications and diet and this apparently resolved soon without any recurrence until recently  Recent history:   The patient was starting to have weight loss, increased thirst, increased urination and blurred vision in June and his glucose was 388 done by his nephrologist along with an A1c of 15  INSULIN regimen is: [place]  30 units daily, Humalog insulin 10 units 3 times a day      Non-insulin hypoglycemic drugs the patient is taking are: None  Current management, blood sugar patterns and problems identified:   He was supposed to switch from the Humulin R U-500 to the Humalog insulin but because of confusion at the pharmacy did not start this until about 5 days ago  He takes [place] once a day, 30 units in the morning  More recently his fasting readings are relatively higher, to about 142  With taking Humalog his blood sugars before and after supper are near normal    Does not report hypoglycemia except when he was on the unit-500 insulin had a glucose of 59 around midnight  However has not checked his sugars after breakfast or lunch lately  He says he is cutting back on portions and carbohydrates compared to before but weight has gone up slightly  Does not do any formal exercise   Side effects from medications have been:None  Compliance with the medical regimen: Improving   Glucose monitoring:  done up to 2  times a day         Glucometer: Accucheck   FASTING the last few days 1 29-1 43 Evening readings this week 72-129 OVERALL 30 day range 59-253 with  average 134    Self-care: The diet that the patient has been following is: tries to limit carbs.     Meal times are: Dinner: 8-9P.m.  Typical meal intake: Breakfast iseggs, oatmeal, sausage, bacon                Dietician visit, most recent:2013                Exercise:  None, he thinks he is active during the day  Weight history:  maximum weight previously 240  Wt Readings from Last 3 Encounters:  05/21/17 214 lb 6.4 oz (97.3 kg)  05/11/17 211 lb (95.7 kg)  05/07/17 210 lb 8 oz (95.5 kg)    Glycemic control:   Lab Results  Component Value Date   HGBA1C 8.3 (H) 05/07/2017   HGBA1C 15 02/23/2017   HGBA1C 4.3 (L) 07/30/2011   Lab Results  Component Value Date   LDLCALC 112 02/23/2017   CREATININE 2.04 (H) 05/18/2017   No results found for: MICRALBCREAT  Lab Results  Component Value Date   FRUCTOSAMINE 315 (H) 05/18/2017      Allergies as of 05/21/2017      Reactions   Hydrocodone Itching      Medication List       Accurate as of 05/21/17  9:20 AM. Always use your most recent med list.          ACCU-CHEK AVIVA PLUS w/Device Kit Use as directed to check blood sugars   ACCU-CHEK SOFTCLIX LANCETS lancets 1 each by Other route 2 (two) times daily. Use to check blood sugars twice a day   acetaminophen 500 MG tablet Commonly known as:  TYLENOL Take 1,000 mg by mouth.   amLODipine 5 MG tablet Commonly known as:  NORVASC Take 5 mg by mouth daily.   atorvastatin 10 MG tablet Commonly known as:  LIPITOR Take 10 mg by mouth daily.   calcitRIOL 0.5 MCG capsule Commonly known as:  ROCALTROL Take 0.5 mcg by mouth daily.   carvedilol 25 MG tablet Commonly known as:  COREG Take 37.5 mg by mouth 2 (two) times daily with a meal.   ACCU-CHEK GUIDE test strip Generic drug:  glucose blood 1 each by Other route 3 (three) times daily. Use to check blood sugars 4-5 times daily   glucose blood test strip Use TID   Insulin Degludec 200 UNIT/ML Sopn Commonly  known as:  TRESIBA FLEXTOUCH Inject 30 Units into the skin daily.   insulin lispro 100 UNIT/ML KiwkPen Commonly known as:  HUMALOG KWIKPEN 15 units before meals   Insulin Pen Needle 32G X 6 MM Misc Commonly known as:  NOVOFINE 1 Act by Does not apply route 4 (four) times daily.   losartan 50 MG tablet Commonly known as:  COZAAR Take 50 mg by mouth daily.   magnesium oxide 400 MG tablet Commonly known as:  MAG-OX Take 400 mg by mouth daily.   mycophenolate 180 MG EC tablet Commonly known as:  MYFORTIC Take 540 mg by mouth 2 (two) times daily.   tacrolimus 1 MG capsule Commonly known as:  PROGRAF Take 6-7 mg by mouth 2 (two) times daily. Take 34m in the morning and 648min the evening   valGANciclovir 450 MG tablet Commonly known as:  VALCYTE Take 450 mg by mouth 3 (three) times a week. Monday, Wednesday and Friday   Vitamin D (Ergocalciferol) 50000 units Caps capsule Commonly known as:  DRISDOL Take by mouth.       Allergies:  Allergies  Allergen Reactions  . Hydrocodone Itching    Past Medical History:  Diagnosis Date  . Anemia   . Anemia   . Benign colon polyp 2012  . Clot    STATED TOLD IN APRIL CLOT TO LEFT SHOULDER. DOCTOR MADE AWARE  . Gout   . Headache(784.0)   . 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 HTN (hypertension)   . Hyperlipidemia   . PONV (postoperative nausea and vomiting)   . Renal failure     Past Surgical History:  Procedure Laterality Date  . AV FISTULA PLACEMENT  01/19/2012   Procedure: ARTERIOVENOUS (AV) FISTULA CREATION;  Surgeon: BrConrad BurlingtonMD;  [place] [facility] Service: [facility];  Laterality: Right;  Right Brachio-cephalic arteriovenous fistula  . AV FISTULA PLACEMENT, RADIOCEPHALIC  0530/86/57 Right arm  . INSERTION OF DIALYSIS CATHETER  01/19/2012   Procedure: INSERTION OF DIALYSIS CATHETER;  Surgeon: BrConrad BurlingtonMD;  [place] [facility] Service: [facility];  Laterality: N/A;  right internal jugular  vein  . 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    Family History  Problem Relation Age of Onset  . Diabetes Father   . Colon cancer Neg Hx   . Esophageal cancer Neg Hx   .  Stomach cancer Neg Hx   . Rectal cancer Neg Hx     Social History:  reports that he has never smoked. He has never used smokeless tobacco. He reports that he does not drink alcohol or use drugs.   Review of Systems     Lipid history: His PCP has started him on Lipitor 10 mg daily    Lab Results  Component Value Date   CHOL 203 (A) 02/23/2017   HDL 66 02/23/2017   [place] 112 02/23/2017   LDLDIRECT 75.0 05/18/2017   TRIG 126 02/23/2017   CHOLHDL 3.2 09/08/2010           Hypertension: since 2003, Followed by nephrologist  Apparently has had end-stage renal disease related to hypertension in the past, transplant done in 2015  He was told not to take an influenza vaccine because of his immunosuppressive drugs  Most recent eye exam was 7/18  Most recent foot exam: 9/18    LABS:  Lab on 05/18/2017  Component Date Value Ref Range Status  . Sodium 05/18/2017 142  135 - 145 mEq/L Final  . Potassium 05/18/2017 3.7  3.5 - 5.1 mEq/L Final  . Chloride 05/18/2017 108  96 - 112 mEq/L Final  . CO2 05/18/2017 27  19 - 32 mEq/L Final  . Glucose, Bld 05/18/2017 105* 70 - 99 mg/dL Final  . BUN 05/18/2017 25* 6 - 23 mg/dL Final  . Creatinine, Ser 05/18/2017 2.04* 0.40 - 1.50 mg/dL Final  . Calcium 05/18/2017 9.8  8.4 - 10.5 mg/dL Final  . GFR 05/18/2017 42.46* >60.00 mL/min Final  . Fructosamine 05/18/2017 315* 0 - 285 umol/L Final   Comment: Published reference interval for apparently healthy subjects between age 68 and 29 is 59 - 285 umol/L and in a poorly controlled diabetic population is 228 - 563 umol/L with a mean of 396 umol/L.   Marland Kitchen Direct LDL 05/18/2017 75.0  mg/dL Final   Optimal:  <100 mg/dLNear or Above Optimal:  100-129 mg/dLBorderline High:  130-159 mg/dLHigh:  160-189 mg/dLVery High:  >190 mg/dL     Physical Examination:  BP 126/82   Pulse 74   Ht '5\' 10"'  (1.778 m)   Wt 214 lb 6.4 oz (97.3 kg)   SpO2 98%   BMI 30.76 kg/m           ASSESSMENT:  Diabetes type 2, Insulin requiring     See history of present illness for detailed discussion of current diabetes management, blood sugar patterns and problems identified  His blood sugars are excellent now With switching to Humalog postprandial readings appear to be excellent but his readings may be low normal at times This is especially with his trying to improve his diet  Fasting readings are relatively higher with 30 units of [place] currently because of not taking the U-500 insulin in the evening  Mild hypercholesterolemia: Needs follow-up while on treatment  PLAN:    Try to check blood sugars at various times of the day  He does need to increase his [place] by at least 2 units and every week consider increasing the dose if morning readings continue to be over 130  He can cut back on his Humalog by 2 units at least at suppertime  Regular exercise   Patient Instructions  Tyler Aas 34 daily and adjust weekly to keep am sugar in range:  Check blood sugars on waking up  3/7 days  Also check blood sugars about 2 hours after a meal and do this  after different meals by rotation  Recommended blood sugar levels on waking up is 90-130 and about 2 hours after meal is 130-160  Please bring your blood sugar monitor to each visit, thank you        [facility] 05/21/2017, 9:20 AM   Note: This office note was prepared with Dragon voice recognition system technology. Any transcriptional errors that result from this process are unintentional.

## 2017-05-21 ENCOUNTER — Ambulatory Visit (INDEPENDENT_AMBULATORY_CARE_PROVIDER_SITE_OTHER): Payer: BLUE CROSS/BLUE SHIELD | Admitting: Endocrinology

## 2017-05-21 ENCOUNTER — Encounter: Payer: Self-pay | Admitting: Endocrinology

## 2017-05-21 VITALS — BP 126/82 | HR 74 | Ht 70.0 in | Wt 214.4 lb

## 2017-05-21 DIAGNOSIS — E1165 Type 2 diabetes mellitus with hyperglycemia: Secondary | ICD-10-CM

## 2017-05-21 DIAGNOSIS — Z794 Long term (current) use of insulin: Secondary | ICD-10-CM | POA: Diagnosis not present

## 2017-05-21 NOTE — Patient Instructions (Addendum)
[name] 34 daily and adjust weekly to keep am sugar in range:  Check blood sugars on waking up  3/7 days  Also check blood sugars about 2 hours after a meal and do this after different meals by rotation  Recommended blood sugar levels on waking up is 90-130 and about 2 hours after meal is 130-160  Please bring your blood sugar monitor to each visit, thank you  Reduce humalog by 2 units, average 8 units at lmeals

## 2017-06-15 ENCOUNTER — Encounter: Payer: Self-pay | Admitting: Internal Medicine

## 2017-06-15 ENCOUNTER — Ambulatory Visit (INDEPENDENT_AMBULATORY_CARE_PROVIDER_SITE_OTHER): Payer: BLUE CROSS/BLUE SHIELD | Admitting: Internal Medicine

## 2017-06-15 ENCOUNTER — Other Ambulatory Visit: Payer: BLUE CROSS/BLUE SHIELD

## 2017-06-15 VITALS — BP 138/80 | HR 73 | Temp 98.3°F | Ht 70.0 in | Wt 213.8 lb

## 2017-06-15 DIAGNOSIS — Z940 Kidney transplant status: Secondary | ICD-10-CM | POA: Diagnosis not present

## 2017-06-15 DIAGNOSIS — K5909 Other constipation: Secondary | ICD-10-CM | POA: Insufficient documentation

## 2017-06-15 DIAGNOSIS — N390 Urinary tract infection, site not specified: Secondary | ICD-10-CM | POA: Diagnosis not present

## 2017-06-15 DIAGNOSIS — N2581 Secondary hyperparathyroidism of renal origin: Secondary | ICD-10-CM | POA: Diagnosis not present

## 2017-06-15 DIAGNOSIS — E785 Hyperlipidemia, unspecified: Secondary | ICD-10-CM | POA: Diagnosis not present

## 2017-06-15 LAB — THYROID PANEL WITH TSH
FREE THYROXINE INDEX: 2.5 (ref 1.4–3.8)
T3 UPTAKE: 28 % (ref 22–35)
T4 TOTAL: 8.8 ug/dL (ref 4.9–10.5)
TSH: 1.45 mIU/L (ref 0.40–4.50)

## 2017-06-15 NOTE — Patient Instructions (Signed)
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
## 2017-06-16 DIAGNOSIS — Z940 Kidney transplant status: Secondary | ICD-10-CM | POA: Diagnosis not present

## 2017-06-16 DIAGNOSIS — N35014 Post-traumatic urethral stricture, male, unspecified: Secondary | ICD-10-CM | POA: Diagnosis not present

## 2017-06-16 DIAGNOSIS — R399 Unspecified symptoms and signs involving the genitourinary system: Secondary | ICD-10-CM | POA: Diagnosis not present

## 2017-07-07 DIAGNOSIS — N2581 Secondary hyperparathyroidism of renal origin: Secondary | ICD-10-CM | POA: Diagnosis not present

## 2017-07-07 DIAGNOSIS — Z940 Kidney transplant status: Secondary | ICD-10-CM | POA: Diagnosis not present

## 2017-07-07 DIAGNOSIS — I129 Hypertensive chronic kidney disease with stage 1 through stage 4 chronic kidney disease, or unspecified chronic kidney disease: Secondary | ICD-10-CM | POA: Diagnosis not present

## 2017-07-07 DIAGNOSIS — D631 Anemia in chronic kidney disease: Secondary | ICD-10-CM | POA: Diagnosis not present

## 2017-07-07 DIAGNOSIS — N183 Chronic kidney disease, stage 3 (moderate): Secondary | ICD-10-CM | POA: Diagnosis not present

## 2017-07-17 ENCOUNTER — Other Ambulatory Visit (INDEPENDENT_AMBULATORY_CARE_PROVIDER_SITE_OTHER): Payer: BLUE CROSS/BLUE SHIELD

## 2017-07-17 DIAGNOSIS — Z794 Long term (current) use of insulin: Secondary | ICD-10-CM | POA: Diagnosis not present

## 2017-07-17 DIAGNOSIS — E1165 Type 2 diabetes mellitus with hyperglycemia: Secondary | ICD-10-CM | POA: Diagnosis not present

## 2017-07-17 LAB — COMPREHENSIVE METABOLIC PANEL
ALT: 13 U/L (ref 0–53)
AST: 24 U/L (ref 0–37)
Albumin: 4.3 g/dL (ref 3.5–5.2)
Alkaline Phosphatase: 69 U/L (ref 39–117)
BUN: 24 mg/dL — AB (ref 6–23)
CHLORIDE: 105 meq/L (ref 96–112)
CO2: 28 mEq/L (ref 19–32)
Calcium: 9.5 mg/dL (ref 8.4–10.5)
Creatinine, Ser: 2.02 mg/dL — ABNORMAL HIGH (ref 0.40–1.50)
GFR: 42.92 mL/min — ABNORMAL LOW (ref >60.00)
GLUCOSE: 152 mg/dL — AB (ref 70–99)
POTASSIUM: 3.8 meq/L (ref 3.5–5.1)
Sodium: 141 mEq/L (ref 135–145)
TOTAL PROTEIN: 6.7 g/dL (ref 6.0–8.3)
Total Bilirubin: 0.9 mg/dL (ref 0.2–1.2)

## 2017-07-17 LAB — LIPID PANEL
Cholesterol: 135 mg/dL (ref 0–200)
HDL: 46.7 mg/dL (ref >39.00)
LDL CALC: 70 mg/dL (ref 0–99)
NONHDL: 88.44
Total CHOL/HDL Ratio: 3
Triglycerides: 94 mg/dL (ref 0.0–149.0)
VLDL: 18.8 mg/dL (ref 0.0–40.0)

## 2017-07-17 LAB — HEMOGLOBIN A1C: Hgb A1c MFr Bld: 6.1 % (ref 4.6–6.5)

## 2017-07-21 ENCOUNTER — Ambulatory Visit: Payer: BLUE CROSS/BLUE SHIELD | Admitting: Endocrinology

## 2017-07-22 NOTE — Progress Notes (Signed)
Patient ID: [name], male   DOB: 12-Jan-1951, 66 y.o.   MRN: [id number]          Reason for Appointment: Follow-up for Type 2 Diabetes  Referring physician: Scarlette Calico   History of Present Illness:          Date of diagnosis of type 2 diabetes mellitus: ?  2004        Background history:   Apparently the patient had hyperglycemia in 2004 but details are not available.  He was treated with some oral medications and diet and this apparently resolved soon without any recurrence until recently The patient was starting to have weight loss, increased thirst, increased urination and blurred vision in June 2018 and his glucose was 388 done by his nephrologist along with an A1c of 15  Recent history:    INSULIN regimen is: [place]  32 units daily, Humalog insulin 8 units 3 times a day      Non-insulin hypoglycemic drugs the patient is taking are: None  His A1c is now 6.1 compared to 8.3 done in September   Current management, blood sugar patterns and problems identified:   He has been checking his blood sugars nearly twice a day but mostly before meals  Although he has some fluctuation in his blood sugars they are averaging about 140 recently  After much discussion he reveals that he has difficulty keeping up with his Humalog doses at meals especially when eating out but also at times and forget to take it before eating  He will sometimes take his Humalog at bedtime when he remembers but has not had any low sugars overnight  Has had a couple of readings in the 60s with taking his lunchtime insulin late in the afternoon  Also occasionally has taken his suppertime dose and hour before eating  Not clear if his postprandial readings are consistent but has only a couple of readings around 170 in the last 2 weeks  He says he thinks he knows how to plan his meals and watch his carbohydrates but has not seen a dietitian in some time  He has gained more weight  TRESIBA was  increased to 32 units on the last visit  However his FASTING readings are fluctuating and occasionally only are below 130 recently  Does not do any formal exercise and is less active in winter   Side effects from medications have been:None  Compliance with the medical regimen: Improving   Glucose monitoring:  done up to 2  times a day         Glucometer: Accucheck  Mean values apply above for all meters except median for One Touch  PRE-MEAL Fasting Lunch Dinner Bedtime Overall  Glucose range: 114-161 189   65-1 73  98-201   Mean/median: 139   129  156  142    Self-care: The diet that the patient has been following is: tries to limit carbs.     Meal times are: Dinner: 8-9 P.m.  Typical meal intake: Breakfast iseggs, oatmeal, sausage, bacon                 Dietician visit, most recent:2013                Exercise:  None, he thinks he is active during the day  Weight history:  maximum weight previously 240  Wt Readings from Last 3 Encounters:  07/23/17 220 lb 12.8 oz (100.2 kg)  06/15/17 213 lb 12 oz (97  kg)  05/21/17 214 lb 6.4 oz (97.3 kg)    Glycemic control:   Lab Results  Component Value Date   HGBA1C 6.1 07/17/2017   HGBA1C 8.3 (H) 05/07/2017   HGBA1C 15 02/23/2017   Lab Results  Component Value Date   LDLCALC 70 07/17/2017   CREATININE 2.02 (H) 07/17/2017   No results found for: MICRALBCREAT  Lab Results  Component Value Date   FRUCTOSAMINE 315 (H) 05/18/2017      Allergies as of 07/23/2017      Reactions   Hydrocodone Itching      Medication List        Accurate as of 07/23/17  9:51 AM. Always use your most recent med list.          ACCU-CHEK AVIVA PLUS w/Device Kit Use as directed to check blood sugars   ACCU-CHEK SOFTCLIX LANCETS lancets 1 each by Other route 2 (two) times daily. Use to check blood sugars twice a day   acetaminophen 500 MG tablet Commonly known as:  TYLENOL Take 1,000 mg by mouth.   amLODipine 5 MG  tablet Commonly known as:  NORVASC Take 5 mg by mouth daily.   atorvastatin 10 MG tablet Commonly known as:  LIPITOR Take 10 mg by mouth daily.   calcitRIOL 0.5 MCG capsule Commonly known as:  ROCALTROL Take 0.5 mcg by mouth daily.   carvedilol 25 MG tablet Commonly known as:  COREG Take 37.5 mg by mouth 2 (two) times daily with a meal.   ACCU-CHEK GUIDE test strip Generic drug:  glucose blood 1 each by Other route 3 (three) times daily. Use to check blood sugars 4-5 times daily   glucose blood test strip Use TID   Insulin Degludec 200 UNIT/ML Sopn Commonly known as:  TRESIBA FLEXTOUCH Inject 30 Units into the skin daily.   insulin lispro 100 UNIT/ML KiwkPen Commonly known as:  HUMALOG KWIKPEN 15 units before meals   Insulin Pen Needle 32G X 6 MM Misc Commonly known as:  NOVOFINE 1 Act by Does not apply route 4 (four) times daily.   losartan 50 MG tablet Commonly known as:  COZAAR Take 50 mg by mouth daily.   magnesium oxide 400 MG tablet Commonly known as:  MAG-OX Take 400 mg by mouth daily.   mycophenolate 180 MG EC tablet Commonly known as:  MYFORTIC Take 540 mg by mouth 2 (two) times daily.   tacrolimus 1 MG capsule Commonly known as:  PROGRAF Take 6-7 mg by mouth 2 (two) times daily. Take '7mg'$  in the morning and '6mg'$  in the evening   valGANciclovir 450 MG tablet Commonly known as:  VALCYTE Take 450 mg by mouth 3 (three) times a week. Monday, Wednesday and Friday   Vitamin D (Ergocalciferol) 50000 units Caps capsule Commonly known as:  DRISDOL Take by mouth.       Allergies:  Allergies  Allergen Reactions  . Hydrocodone Itching    Past Medical History:  Diagnosis Date  . Anemia   . Anemia   . Benign colon polyp 2012  . Clot    STATED TOLD IN APRIL CLOT TO LEFT SHOULDER. DOCTOR MADE AWARE  . Gout   . Headache(784.0)   . 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 HTN (hypertension)   . Hyperlipidemia   . PONV  (postoperative nausea and vomiting)   . Renal failure     Past Surgical History:  Procedure Laterality Date  . AV FISTULA PLACEMENT  01/19/2012   Procedure: ARTERIOVENOUS (AV) FISTULA CREATION;  Surgeon: Conrad Cowley, MD;  Location: [facility] OR;  Service: Vascular;  Laterality: Right;  Right Brachio-cephalic arteriovenous fistula  . AV FISTULA PLACEMENT, RADIOCEPHALIC  47/42/59   Right arm  . INSERTION OF DIALYSIS CATHETER  01/19/2012   Procedure: INSERTION OF DIALYSIS CATHETER;  Surgeon: Conrad Cottonwood, MD;  Location: [place];  Service: Vascular;  Laterality: N/A;  right internal jugular vein  . 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    Family History  Problem Relation Age of Onset  . Diabetes Father   . Colon cancer Neg Hx   . Esophageal cancer Neg Hx   . Stomach cancer Neg Hx   . Rectal cancer Neg Hx     Social History:  reports that  has never smoked. he has never used smokeless tobacco. He reports that he does not drink alcohol or use drugs.   Review of Systems   Lipid history: His PCP has treated him with Lipitor 10 mg daily    Lab Results  Component Value Date   CHOL 135 07/17/2017   HDL 46.70 07/17/2017   LDLCALC 70 07/17/2017   LDLDIRECT 75.0 05/18/2017   TRIG 94.0 07/17/2017   CHOLHDL 3 07/17/2017           Hypertension: since 2003, Followed by nephrologist  He had end-stage renal disease related to hypertension in the past, transplant done in 2015   Most recent eye exam was 7/18  Most recent foot exam: 9/18    LABS:  Lab on 07/17/2017  Component Date Value Ref Range Status  . Cholesterol 07/17/2017 135  0 - 200 mg/dL Final   ATP III Classification       Desirable:  < 200 mg/dL               Borderline High:  200 - 239 mg/dL          High:  > = 240 mg/dL  . Triglycerides 07/17/2017 94.0  0.0 - 149.0 mg/dL Final   Normal:  <150 mg/dLBorderline High:  150 - 199 mg/dL  . HDL 07/17/2017 46.70  >39.00 mg/dL Final  . VLDL 07/17/2017 18.8  0.0 - 40.0 mg/dL Final  .  LDL Cholesterol 07/17/2017 70  0 - 99 mg/dL Final  . Total CHOL/HDL Ratio 07/17/2017 3   Final                  Men          Women1/2 Average Risk     3.4          3.3Average Risk          5.0          4.42X Average Risk          9.6          7.13X Average Risk          15.0          11.0                      . NonHDL 07/17/2017 88.44   Final   NOTE:  Non-HDL goal should be 30 mg/dL higher than patient's LDL goal (i.e. LDL goal of < 70 mg/dL, would have non-HDL goal of < 100 mg/dL)  . Sodium 07/17/2017 141  135 - 145 mEq/L Final  . Potassium 07/17/2017 3.8  3.5 - 5.1 mEq/L Final  . Chloride 07/17/2017 105  96 - 112 mEq/L Final  . CO2 07/17/2017 28  19 - 32 mEq/L Final  . Glucose, Bld 07/17/2017 152* 70 - 99 mg/dL Final  . BUN 07/17/2017 24* 6 - 23 mg/dL Final  . Creatinine, Ser 07/17/2017 2.02* 0.40 - 1.50 mg/dL Final  . Total Bilirubin 07/17/2017 0.9  0.2 - 1.2 mg/dL Final  . Alkaline Phosphatase 07/17/2017 69  39 - 117 U/L Final  . AST 07/17/2017 24  0 - 37 U/L Final  . ALT 07/17/2017 13  0 - 53 U/L Final  . Total Protein 07/17/2017 6.7  6.0 - 8.3 g/dL Final  . Albumin 07/17/2017 4.3  3.5 - 5.2 g/dL Final  . Calcium 07/17/2017 9.5  8.4 - 10.5 mg/dL Final  . GFR 07/17/2017 42.92* >60.00 mL/min Final  . Hgb A1c MFr Bld 07/17/2017 6.1  4.6 - 6.5 % Final   Glycemic Control Guidelines for People with Diabetes:Non Diabetic:  <6%Goal of Therapy: <7%Additional Action Suggested:  >8%     Physical Examination:  BP 136/84   Pulse 72   Ht '5\' 10"'$  (1.778 m)   Wt 220 lb 12.8 oz (100.2 kg)   SpO2 98%   BMI 31.68 kg/m           ASSESSMENT:  Diabetes type 2, Insulin requiring     See history of present illness for detailed discussion of current diabetes management, blood sugar patterns and problems identified  A1c is surprisingly lower at 6.1 now  His blood sugars are generally better but he still has significant fluctuation including fasting readings This is likely to be related to  inconsistent control of his mealtime hyperglycemia Factors as above are related to variability in his diet, timing of his Humalog and not always taking the Humalog before eating, also not adjusting the Humalog based on what he is eating Also not able to get much idea of his postprandial readings with his limited monitoring Fasting readings probably reflecting how his diet and insulin doses have been the night before  Fasting readings are variable but still occasionally higher with 32 units of Tresiba  Mild hypercholesterolemia: Controlled now  PLAN:    Discussed in detail the normal course of blood sugar rise after meals and the need to coordinate his postprandial hyperglycemia with insulin dose timing  Also needs to adjust his mealtime doses based on total carbohydrates and meal size  He also needs to check readings after meals more often to help adjust his Humalog  Most likely can continue to 8 units for basic balanced meals and go up or down if needed  He refuses to see the dietitian as recommended today  If he is able to get his Humalog more consistent he may or may not need adjustment of his [place].  Fasting blood sugar patterns will determine his dosage and discussed adjusting this further by about 2 units on a weekly basis  Discussed blood sugar targets at various times  Encouraged him to be active to prevent further weight gain, he is possibly going to join a gym  Also check fructosamine since A1c appears to be falsely low  Patient Instructions  Must take Humalog 5-10 min BEFORE meals   If am sugar stays > 140 then 34 Tresiba   Counseling time on subjects discussed in assessment and plan sections is over 50% of today's 25 minute visit     Elayne Snare 07/23/2017, 9:51 AM   Note: This office note was prepared with Dragon voice recognition system  technology. Any transcriptional errors that result from this process are unintentional.

## 2017-07-23 ENCOUNTER — Ambulatory Visit (INDEPENDENT_AMBULATORY_CARE_PROVIDER_SITE_OTHER): Payer: BLUE CROSS/BLUE SHIELD | Admitting: Endocrinology

## 2017-07-23 ENCOUNTER — Encounter: Payer: Self-pay | Admitting: Endocrinology

## 2017-07-23 VITALS — BP 136/84 | HR 72 | Ht 70.0 in | Wt 220.8 lb

## 2017-07-23 DIAGNOSIS — E1165 Type 2 diabetes mellitus with hyperglycemia: Secondary | ICD-10-CM | POA: Diagnosis not present

## 2017-07-23 DIAGNOSIS — Z794 Long term (current) use of insulin: Secondary | ICD-10-CM

## 2017-07-23 DIAGNOSIS — E7800 Pure hypercholesterolemia, unspecified: Secondary | ICD-10-CM | POA: Diagnosis not present

## 2017-07-23 NOTE — Patient Instructions (Addendum)
Must take Humalog 5-10 min BEFORE meals and adjust based on Carb intake  If am sugar stays > 140 then 34 Tresiba   Walk if not being active

## 2017-10-15 DIAGNOSIS — E785 Hyperlipidemia, unspecified: Secondary | ICD-10-CM | POA: Diagnosis not present

## 2017-10-15 DIAGNOSIS — Z940 Kidney transplant status: Secondary | ICD-10-CM | POA: Diagnosis not present

## 2017-10-15 DIAGNOSIS — N2581 Secondary hyperparathyroidism of renal origin: Secondary | ICD-10-CM | POA: Diagnosis not present

## 2017-10-16 ENCOUNTER — Other Ambulatory Visit (INDEPENDENT_AMBULATORY_CARE_PROVIDER_SITE_OTHER): Payer: BLUE CROSS/BLUE SHIELD

## 2017-10-16 DIAGNOSIS — E1165 Type 2 diabetes mellitus with hyperglycemia: Secondary | ICD-10-CM

## 2017-10-16 DIAGNOSIS — Z794 Long term (current) use of insulin: Secondary | ICD-10-CM | POA: Diagnosis not present

## 2017-10-16 LAB — HEMOGLOBIN A1C: Hgb A1c MFr Bld: 6.7 % — ABNORMAL HIGH (ref 4.6–6.5)

## 2017-10-16 LAB — GLUCOSE, RANDOM: Glucose, Bld: 134 mg/dL — ABNORMAL HIGH (ref 70–99)

## 2017-10-17 LAB — FRUCTOSAMINE: FRUCTOSAMINE: 303 umol/L — AB (ref 0–285)

## 2017-10-20 ENCOUNTER — Other Ambulatory Visit: Payer: Self-pay | Admitting: Endocrinology

## 2017-10-21 ENCOUNTER — Ambulatory Visit (INDEPENDENT_AMBULATORY_CARE_PROVIDER_SITE_OTHER): Payer: BLUE CROSS/BLUE SHIELD | Admitting: Endocrinology

## 2017-10-21 ENCOUNTER — Encounter: Payer: Self-pay | Admitting: Endocrinology

## 2017-10-21 VITALS — BP 140/86 | HR 72 | Ht 70.0 in | Wt 222.0 lb

## 2017-10-21 DIAGNOSIS — E1165 Type 2 diabetes mellitus with hyperglycemia: Secondary | ICD-10-CM | POA: Diagnosis not present

## 2017-10-21 DIAGNOSIS — Z794 Long term (current) use of insulin: Secondary | ICD-10-CM | POA: Diagnosis not present

## 2017-10-21 DIAGNOSIS — N184 Chronic kidney disease, stage 4 (severe): Secondary | ICD-10-CM | POA: Diagnosis not present

## 2017-10-21 NOTE — Progress Notes (Signed)
Patient ID: [name], male   DOB: 18-Jul-1951, 67 y.o.   MRN: [id number]          Reason for Appointment: Follow-up for Type 2 Diabetes  Referring physician: Scarlette Calico   History of Present Illness:          Date of diagnosis of type 2 diabetes mellitus: ?  2004        Background history:   Apparently the patient had hyperglycemia in 2004 but details are not available.  He was treated with some oral medications and diet and this apparently resolved soon without any recurrence until recently The patient was starting to have weight loss, increased thirst, increased urination and blurred vision in June 2018 and his glucose was 388 done by his nephrologist along with an A1c of 15  Recent history:    INSULIN regimen is: [place]  32 units daily, Humalog insulin 10 units 3 times a day      Non-insulin hypoglycemic drugs the patient is taking are: None  His A1c is now 6.7 compared to 6.1 previously, highest 8.3 done in September 2018   Current management, blood sugar patterns and problems identified:   He has been checking his blood sugars mostly in the morning and only randomly in the evening  He is sometimes taking his blood sugar 30-60 minutes after eating  FASTING blood sugars are overall better than the last visit but still fluctuating some, recently mostly around 120-130  He has continued the same dose of [place] and taking this consistently at the same time  Also even though he is trying to take his Humalog before eating the timing may be variable  Does not do any formal exercise but he says that he is physically active at work, however does not feel hypoglycemic during the day when he is active  He does try to somewhat adjust his Humalog based on his meal size and can take anywhere between 6-12 units, however he is doing this somewhat empirically and not following up with 2-hour blood sugar testing usually  His diet is generally not excessive and carbohydrates but  not always leaving off fatty meats   Side effects from medications have been:None  Compliance with the medical regimen: Improving   Glucose monitoring:  done up to 2  times a day         Glucometer: Accucheck  Mean values apply above for all meters except median for One Touch  PRE-MEAL Fasting Lunch Dinner Bedtime Overall  Glucose range:  111-156    95-193   Mean/median: 133     135   Previous readings  Mean values apply above for all meters except median for One Touch  PRE-MEAL Fasting Lunch Dinner Bedtime Overall  Glucose range: 114-161 189   65-1 73  98-201   Mean/median: 139   129  156  142    Self-care: The diet that the patient has been following is: tries to limit carbs.     Meal times are: Dinner: 8-9 P.m.  Typical meal intake: Breakfast is usually eggs, oatmeal, sausage, bacon                 Dietician visit, most recent:2013                Exercise:  None, he thinks he is active during the day  Weight history:  maximum weight previously 240  Wt Readings from Last 3 Encounters:  10/21/17 222 lb (100.7 kg)  07/23/17  220 lb 12.8 oz (100.2 kg)  06/15/17 213 lb 12 oz (97 kg)    Glycemic control:   Lab Results  Component Value Date   HGBA1C 6.7 (H) 10/16/2017   HGBA1C 6.1 07/17/2017   HGBA1C 8.3 (H) 05/07/2017   Lab Results  Component Value Date   LDLCALC 70 07/17/2017   CREATININE 2.02 (H) 07/17/2017   No results found for: MICRALBCREAT  Lab Results  Component Value Date   FRUCTOSAMINE 303 (H) 10/16/2017   FRUCTOSAMINE 315 (H) 05/18/2017      Allergies as of 10/21/2017      Reactions   Hydrocodone Itching      Medication List        Accurate as of 10/21/17  9:07 AM. Always use your most recent med list.          ACCU-CHEK AVIVA PLUS w/Device Kit Use as directed to check blood sugars   ACCU-CHEK SOFTCLIX LANCETS lancets 1 each by Other route 2 (two) times daily. Use to check blood sugars twice a day   acetaminophen 500 MG  tablet Commonly known as:  TYLENOL Take 1,000 mg by mouth.   amLODipine 5 MG tablet Commonly known as:  NORVASC Take 5 mg by mouth daily.   atorvastatin 10 MG tablet Commonly known as:  LIPITOR Take 10 mg by mouth daily.   calcitRIOL 0.5 MCG capsule Commonly known as:  ROCALTROL Take 0.5 mcg by mouth daily.   carvedilol 25 MG tablet Commonly known as:  COREG Take 37.5 mg by mouth 2 (two) times daily with a meal.   ACCU-CHEK GUIDE test strip Generic drug:  glucose blood 1 each by Other route 3 (three) times daily. Use to check blood sugars 4-5 times daily   glucose blood test strip Use TID   HUMULIN R U-500 KWIKPEN 500 UNIT/ML kwikpen Generic drug:  insulin regular human CONCENTRATED   Insulin Degludec 200 UNIT/ML Sopn Commonly known as:  TRESIBA FLEXTOUCH Inject 30 Units into the skin daily.   insulin lispro 100 UNIT/ML KiwkPen Commonly known as:  HUMALOG KWIKPEN INJECT 15 UNITS BEFORE MEALS   Insulin Pen Needle 32G X 6 MM Misc Commonly known as:  NOVOFINE 1 Act by Does not apply route 4 (four) times daily.   latanoprost 0.005 % ophthalmic solution Commonly known as:  XALATAN   losartan 50 MG tablet Commonly known as:  COZAAR Take 50 mg by mouth daily.   magnesium oxide 400 MG tablet Commonly known as:  MAG-OX Take 400 mg by mouth daily.   mycophenolate 180 MG EC tablet Commonly known as:  MYFORTIC Take 540 mg by mouth 2 (two) times daily.   tacrolimus 1 MG capsule Commonly known as:  PROGRAF Take 6-7 mg by mouth 2 (two) times daily. Take 20m in the morning and 644min the evening   valGANciclovir 450 MG tablet Commonly known as:  VALCYTE Take 450 mg by mouth 3 (three) times a week. Monday, Wednesday and Friday   Vitamin D (Ergocalciferol) 50000 units Caps capsule Commonly known as:  DRISDOL Take by mouth.       Allergies:  Allergies  Allergen Reactions  . Hydrocodone Itching    Past Medical History:  Diagnosis Date  . Anemia   . Anemia    . Benign colon polyp 2012  . Clot    STATED TOLD IN APRIL CLOT TO LEFT SHOULDER. DOCTOR MADE AWARE  . Gout   . Headache(784.0)   . 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 HTN (hypertension)   . Hyperlipidemia   . PONV (postoperative nausea and vomiting)   . Renal failure     Past Surgical History:  Procedure Laterality Date  . AV FISTULA PLACEMENT  01/19/2012   Procedure: ARTERIOVENOUS (AV) FISTULA CREATION;  Surgeon: Conrad [place], MD;  Location: [facility];  Service: Vascular;  Laterality: Right;  Right Brachio-cephalic arteriovenous fistula  . AV FISTULA PLACEMENT, RADIOCEPHALIC  24/49/75   Right arm  . INSERTION OF DIALYSIS CATHETER  01/19/2012   Procedure: INSERTION OF DIALYSIS CATHETER;  Surgeon: Conrad [place], MD;  Location: [facility];  Service: Vascular;  Laterality: N/A;  right internal jugular vein  . 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    Family History  Problem Relation Age of Onset  . Diabetes Father   . Colon cancer Neg Hx   . Esophageal cancer Neg Hx   . Stomach cancer Neg Hx   . Rectal cancer Neg Hx     Social History:  reports that  has never smoked. he has never used smokeless tobacco. He reports that he does not drink alcohol or use drugs.   Review of Systems   Lipid history: His PCP has treated him with Lipitor 10 mg daily    Lab Results  Component Value Date   CHOL 135 07/17/2017   HDL 46.70 07/17/2017   LDLCALC 70 07/17/2017   LDLDIRECT 75.0 05/18/2017   TRIG 94.0 07/17/2017   CHOLHDL 3 07/17/2017           Hypertension: since 2003, Followed by nephrologist  He had end-stage renal disease related to hypertension in the past, transplant done in 2015  BP Readings from Last 3 Encounters:  10/21/17 140/86  07/23/17 136/84  06/15/17 138/80     Most recent eye exam was 7/18  Most recent foot exam: 9/18    LABS:  Lab on 10/16/2017  Component Date Value Ref Range Status  . Glucose, Bld 10/16/2017 134* 70 - 99 mg/dL  Final  . Fructosamine 10/16/2017 303* 0 - 285 umol/L Final   Comment: Published reference interval for apparently healthy subjects between age 61 and 74 is 3 - 285 umol/L and in a poorly controlled diabetic population is 228 - 563 umol/L with a mean of 396 umol/L.   Marland Kitchen Hgb A1c MFr Bld 10/16/2017 6.7* 4.6 - 6.5 % Final   Glycemic Control Guidelines for People with Diabetes:Non Diabetic:  <6%Goal of Therapy: <7%Additional Action Suggested:  >8%     Physical Examination:  BP 140/86 (BP Location: Left Arm, Patient Position: Sitting, Cuff Size: Large)   Pulse 72   Ht '5\' 10"'  (1.778 m)   Wt 222 lb (100.7 kg)   SpO2 98%   BMI 31.85 kg/m           ASSESSMENT:  Diabetes type 2, Insulin requiring     See history of present illness for detailed discussion of current diabetes management, blood sugar patterns and problems identified  A1c is 6.7 although has been as low as 6.1, possibly because of concomitant renal failure and red cells turn over time  Also fructosamine is as expected, just over 300 now  His blood sugars at home are overall looking similar but he is still not doing enough readings after meals especially after breakfast or lunch  He is not clear about how often or when to check his blood sugar and what blood sugar targets are and this was discussed in detail He is  sometimes taking his blood sugar 30-60 minutes after eating Effects of diet, timing of insulin and postprandial blood sugar patterns discussed today  Fasting readings are variable but still occasionally higher with 32 units of Tresiba  Mild hypercholesterolemia: Controlled   Hypertension: To be followed by nephrologist  PLAN:    Discussed day-to-day adjustment of his mealtime insulin based on readings about 2 hours later  He will check more readings as discussed above  Can continue the same dose of Tresiba unless fasting readings are 130 and higher more consistently  To cover snacks with 4-6 units of  Humalog especially at bedtime if he is eating any carbohydrate  He will not take any U-500 insulin which he has left over since this is not appropriate for his mealtime control  Continue Humalog, may consider insulin pump if he has difficulty with remembering to do that injections consistently when not at home  There are no Patient Instructions on file for this visit.  Counseling time on subjects discussed in assessment and plan sections is over 50% of today's 25 minute visit     Elayne Snare 10/21/2017, 9:07 AM   Note: This office note was prepared with Dragon voice recognition system technology. Any transcriptional errors that result from this process are unintentional.

## 2017-10-21 NOTE — Patient Instructions (Signed)
Check blood sugars on waking up  3/7 days  Also check blood sugars about 2 hours after a meal and do this after different meals by rotation  Recommended blood sugar levels on waking up is 90-130 and about 2 hours after meal is 130-160  Please bring your blood sugar monitor to each visit, thank you   

## 2017-10-26 DIAGNOSIS — N183 Chronic kidney disease, stage 3 (moderate): Secondary | ICD-10-CM | POA: Diagnosis not present

## 2017-10-26 DIAGNOSIS — Z940 Kidney transplant status: Secondary | ICD-10-CM | POA: Diagnosis not present

## 2017-10-26 DIAGNOSIS — N2581 Secondary hyperparathyroidism of renal origin: Secondary | ICD-10-CM | POA: Diagnosis not present

## 2017-10-26 DIAGNOSIS — D631 Anemia in chronic kidney disease: Secondary | ICD-10-CM | POA: Diagnosis not present

## 2017-10-26 DIAGNOSIS — I129 Hypertensive chronic kidney disease with stage 1 through stage 4 chronic kidney disease, or unspecified chronic kidney disease: Secondary | ICD-10-CM | POA: Diagnosis not present

## 2017-10-29 ENCOUNTER — Ambulatory Visit: Payer: BLUE CROSS/BLUE SHIELD | Admitting: Family Medicine

## 2017-10-29 DIAGNOSIS — Z0289 Encounter for other administrative examinations: Secondary | ICD-10-CM

## 2017-10-29 NOTE — Progress Notes (Deleted)
[name] - 67 y.o. male MRN [id number]  Date of birth: [birth year]  SUBJECTIVE:  Including CC & ROS.  No chief complaint on file.   [name] is a 67 y.o. male that is  ***.  ***   Review of Systems  HISTORY: Past Medical, Surgical, Social, and Family History Reviewed & Updated per EMR.   Pertinent Historical Findings include:  Past Medical History:  Diagnosis Date  . Anemia   . Anemia   . Benign colon polyp 2012  . Clot    STATED TOLD IN APRIL CLOT TO LEFT SHOULDER. DOCTOR MADE AWARE  . Gout   . Headache(784.0)   . 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 HTN (hypertension)   . Hyperlipidemia   . PONV (postoperative nausea and vomiting)   . Renal failure     Past Surgical History:  Procedure Laterality Date  . AV FISTULA PLACEMENT  01/19/2012   Procedure: ARTERIOVENOUS (AV) FISTULA CREATION;  Surgeon: Conrad [place], MD;  Location: [facility];  Service: Vascular;  Laterality: Right;  Right Brachio-cephalic arteriovenous fistula  . AV FISTULA PLACEMENT, RADIOCEPHALIC  39/76/73   Right arm  . INSERTION OF DIALYSIS CATHETER  01/19/2012   Procedure: INSERTION OF DIALYSIS CATHETER;  Surgeon: Conrad [place], MD;  Location: [facility];  Service: Vascular;  Laterality: N/A;  right internal jugular vein  . 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 Hydrocodone Itching    Family History  Problem Relation Age of Onset  . Diabetes Father   . Colon cancer Neg Hx   . Esophageal cancer Neg Hx   . Stomach cancer Neg Hx   . Rectal cancer Neg Hx      Social History   Socioeconomic History  . Marital status: Married    Spouse name: Not on file  . Number of children: 5  . Years of education: Not on file  . Highest education level: Not on file  Occupational History  . Occupation: PhD Lobbyist: [employer]  . Financial resource strain: Not on file  . Food insecurity:    Worry: Not on file    Inability: Not on file  . Transportation needs:    Medical: Not on file    Non-medical: Not on file  Tobacco Use  . Smoking status: Never Smoker  . Smokeless tobacco: Never Used  Substance and Sexual Activity  . Alcohol use: No    Comment: 1 drink a month or non  . Drug use: No  . Sexual activity: Yes    Birth control/protection: Condom  Lifestyle  . Physical activity:    Days per week: Not on file    Minutes per session: Not on file  . Stress: Not on file  Relationships  . Social connections:    Talks on phone: Not on file    Gets together: Not on file    Attends religious service: Not on file    Active member of club or organization: Not on file    Attends meetings of clubs or organizations: Not on file    Relationship status: Not on file  . Intimate partner violence:    Fear of current or ex partner: Not on file    Emotionally abused: Not on file    Physically abused: Not on file    Forced sexual activity: Not on file  Other Topics Concern  .  Not on file  Social History Narrative   Regular Exercise -  YES           PHYSICAL EXAM:  VS: There were no vitals taken for this visit. Physical Exam Gen: NAD, alert, cooperative with exam, well-appearing ENT: normal lips, normal nasal mucosa,  Eye: normal EOM, normal conjunctiva and lids CV:  no edema, +2 pedal pulses   Resp: no accessory muscle use, non-labored,  GI: no masses or tenderness, no hernia  Skin: no rashes, no areas of induration  Neuro: normal tone, normal sensation to touch Psych:  normal insight, alert and oriented MSK:  ***      ASSESSMENT & PLAN:   No problem-specific Assessment & Plan notes found for this encounter.

## 2017-11-06 ENCOUNTER — Telehealth: Payer: Self-pay | Admitting: *Deleted

## 2017-11-06 NOTE — Telephone Encounter (Signed)
Called patient and LVM to inform him that Dr. Doristine Locks assistant Rebeca reached out to this nurse stating that the patient could benefit from community resources. Nurse explained that she would be happy to assist patient and stated that she would reach out to the patient again in a few days. Nurse also left her contact number on the VM and requested that the patient call her back at his convenience.

## 2017-11-11 ENCOUNTER — Ambulatory Visit (INDEPENDENT_AMBULATORY_CARE_PROVIDER_SITE_OTHER): Payer: BLUE CROSS/BLUE SHIELD | Admitting: Internal Medicine

## 2017-11-11 ENCOUNTER — Encounter: Payer: Self-pay | Admitting: Internal Medicine

## 2017-11-11 VITALS — BP 140/82 | HR 79 | Temp 98.6°F | Resp 16 | Ht 70.0 in | Wt 221.2 lb

## 2017-11-11 DIAGNOSIS — N135 Crossing vessel and stricture of ureter without hydronephrosis: Secondary | ICD-10-CM | POA: Diagnosis not present

## 2017-11-11 DIAGNOSIS — G8929 Other chronic pain: Secondary | ICD-10-CM | POA: Diagnosis not present

## 2017-11-11 DIAGNOSIS — M25511 Pain in right shoulder: Secondary | ICD-10-CM

## 2017-11-11 DIAGNOSIS — I10 Essential (primary) hypertension: Secondary | ICD-10-CM | POA: Diagnosis not present
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
## 2017-11-11 NOTE — Patient Instructions (Signed)

## 2017-11-16 NOTE — Telephone Encounter (Signed)
Called and LVM stating that nurse was reaching out to assist patient with any needs for community resources. Nurse left her contact number and requested that the patient contact her for any resource needs or questions.

## 2017-12-05 NOTE — Progress Notes (Signed)
[name] [address] [place], [place] [address] Phone: [phone] Subjective:      CC: Right shoulder pain  [id number]  [name] is a 67 y.o. male coming in with complaint of right shoulder pain. He was hit by a crane last year and did some physical therapy for his right shoulder. Since the injury, he has felt a clicking sensation which causes the shoulder to "seize up." Patient does have dull achy pain throughout the day.  Patient describes the pain as a dull throbbing aching pain.  Does not stop him from activities.  Patient states he does not notice any weakness.  States that sometimes this is more of a sharp pain that affects daily activities and sometimes gives him a discomfort at night.  Rates the severity of pain is 5 out of 10     Past Medical History:  Diagnosis Date  . Anemia   . Anemia   . Benign colon polyp 2012  . Clot    STATED TOLD IN APRIL CLOT TO LEFT SHOULDER. DOCTOR MADE AWARE  . Gout   . Headache(784.0)   . 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 HTN (hypertension)   . Hyperlipidemia   . PONV (postoperative nausea and vomiting)   . Renal failure    Past Surgical History:  Procedure Laterality Date  . AV FISTULA PLACEMENT  01/19/2012   Procedure: ARTERIOVENOUS (AV) FISTULA CREATION;  Surgeon: Conrad Milton, MD;  Location: [facility];  Service: Vascular;  Laterality: Right;  Right Brachio-cephalic arteriovenous fistula  . AV FISTULA PLACEMENT, RADIOCEPHALIC  34/28/76   Right arm  . INSERTION OF DIALYSIS CATHETER  01/19/2012   Procedure: INSERTION OF DIALYSIS CATHETER;  Surgeon: Conrad Browning, MD;  Location: [facility];  Service: Vascular;  Laterality: N/A;  right internal jugular vein  . 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   Social History   Socioeconomic History  . Marital status: Married    Spouse name: Not on file  . Number of children: 5  . Years of education: Not on file  . Highest education  level: Not on file  Occupational History  . Occupation: PhD Lobbyist: [employer]  . Financial resource strain: Not on file  . Food insecurity:    Worry: Not on file    Inability: Not on file  . Transportation needs:    Medical: Not on file    Non-medical: Not on file  Tobacco Use  . Smoking status: Never Smoker  . Smokeless tobacco: Never Used  Substance and Sexual Activity  . Alcohol use: No    Comment: 1 drink a month or non  . Drug use: No  . Sexual activity: Yes    Birth control/protection: Condom  Lifestyle  . Physical activity:    Days per week: Not on file    Minutes per session: Not on file  . Stress: Not on file  Relationships  . Social connections:    Talks on phone: Not on file    Gets together: Not on file    Attends religious service: Not on file    Active member of club or organization: Not on file    Attends meetings of clubs or organizations: Not on file    Relationship status: Not on file  Other Topics Concern  . Not on file  Social History Narrative   Regular Exercise -  YES  Allergies  Allergen Reactions  . Hydrocodone Itching   Family History  Problem Relation Age of Onset  . Diabetes Father   . Colon cancer Neg Hx   . Esophageal cancer Neg Hx   . Stomach cancer Neg Hx   . Rectal cancer Neg Hx      Past medical history, social, surgical and family history all reviewed in electronic medical record.  No pertanent information unless stated regarding to the chief complaint.   Review of Systems:Review of systems updated and as accurate as of 12/07/17  No headache, visual changes, nausea, vomiting, diarrhea, constipation, dizziness, abdominal pain, skin rash, fevers, chills, night sweats, weight loss, swollen lymph nodes, body aches, joint swelling, chest pain, shortness of breath, mood changes.  Positive muscle aches  Objective  Blood pressure 124/68, pulse 67, height 5\' 10"  (1.778 m), weight 219 lb (99.3  kg), SpO2 96 %. Systems examined below as of 12/07/17   General: No apparent distress alert and oriented x3 mood and affect normal, dressed appropriately.  HEENT: Pupils equal, extraocular movements intact  Respiratory: Patient's speak in full sentences and does not appear short of breath  Cardiovascular: No lower extremity edema, non tender, no erythema  Skin: Warm dry intact with no signs of infection or rash on extremities or on axial skeleton.  Abdomen: Soft nontender  Neuro: Cranial nerves II through XII are intact, neurovascularly intact in all extremities with 2+ DTRs and 2+ pulses.  Lymph: No lymphadenopathy of posterior or anterior cervical chain or axillae bilaterally.  Gait normal with good balance and coordination.  MSK:  Non tender with full range of motion and good stability and symmetric strength and tone of  elbows, wrist, hip, knee and ankles bilaterally.  Shoulder: Right Inspection reveals no abnormalities, atrophy or asymmetry. Palpation is normal with no tenderness over AC joint or bicipital groove. ROM is full in all planes passively. Rotator cuff strength normal throughout. signs of impingement with positive Neer and Hawkin's tests, but negative empty can sign. Speeds and Yergason's tests normal. No labral pathology noted with negative Obrien's, negative clunk and good stability. Normal scapular function observed. No painful arc and no drop arm sign. No apprehension sign  MSK US performed of: Right This study was ordered, performed, and interpreted by Charlann Boxer D.O.  Shoulder:   Supraspinatus:  Appears normal on long and transverse views, Bursal bulge seen with shoulder abduction on impingement view. Infraspinatus:  Appears normal on long and transverse views. Significant increase in Doppler flow Subscapularis:  Appears normal on long and transverse views. Positive bursa Teres Minor:  Appears normal on long and transverse views. AC joint:  Capsule undistended,  no geyser sign. Glenohumeral Joint:  Appears normal without effusion. Glenoid Labrum:  Intact without visualized tears. Biceps Tendon:  Appears normal on long and transverse views, no fraying of tendon, tendon located in intertubercular groove, no subluxation with shoulder internal or external rotation.  Impression: Subacromial bursitis mild  97110; 15 additional minutes spent for Therapeutic exercises as stated in above notes.  This included exercises focusing on stretching, strengthening, with significant focus on eccentric aspects.   Long term goals include an improvement in range of motion, strength, endurance as well as avoiding reinjury. Patient's frequency would include in 1-2 times a day, 3-5 times a week for a duration of 6-12 weeks. Shoulder Exercises that included:  Basic scapular stabilization to include adduction and depression of scapula Scaption, focusing on proper movement and good control Internal and External rotation utilizing  a theraband, with elbow tucked at side entire time Rows with theraband  Which was given   Proper technique shown and discussed handout in great detail with ATC.  All questions were discussed and answered.      Impression and Recommendations:     This case required medical decision making of moderate complexity.      Note: This dictation was prepared with Dragon dictation along with smaller phrase technology. Any transcriptional errors that result from this process are unintentional.

## 2017-12-07 ENCOUNTER — Ambulatory Visit: Payer: Self-pay

## 2017-12-07 ENCOUNTER — Ambulatory Visit (INDEPENDENT_AMBULATORY_CARE_PROVIDER_SITE_OTHER): Payer: BLUE CROSS/BLUE SHIELD | Admitting: Family Medicine

## 2017-12-07 ENCOUNTER — Encounter: Payer: Self-pay | Admitting: Family Medicine

## 2017-12-07 VITALS — BP 124/68 | HR 67 | Ht 70.0 in | Wt 219.0 lb

## 2017-12-07 DIAGNOSIS — M7551 Bursitis of right shoulder: Secondary | ICD-10-CM | POA: Diagnosis not present

## 2017-12-07 DIAGNOSIS — G8929 Other chronic pain: Secondary | ICD-10-CM

## 2017-12-07 DIAGNOSIS — M25511 Pain in right shoulder: Secondary | ICD-10-CM | POA: Diagnosis not present

## 2017-12-07 NOTE — Patient Instructions (Addendum)
Good to see you  Ice 20 minutes 2 times daily. Usually after activity and before bed. Exercises 3 times a week.   pennsaid pinkie amount topically 2 times daily as needed.  Keep hands within peripheral vision  Over the counter try Tart cherry extract 1000mg  nightly can help if gout is playing a role.  See me again in 4 weeks If not better we will consider injections or PT

## 2017-12-07 NOTE — Assessment & Plan Note (Signed)
Patient does have more of a shoulder bursitis.  Discussed icing regimen and home exercises.  Discussed which activities to do which wants to avoid.  Patient is to increase activity slowly over the course the next several days.  Patient will increase topical anti-inflammatories and over-the-counter medications.  Follow-up again with me in 4 weeks.  Differential was not broad but could be a small rotator cuff tear but not appreciated on ultrasound today.  Cervical radiculopathy was discussed but no Spurling's test.

## 2017-12-18 DIAGNOSIS — R8271 Bacteriuria: Secondary | ICD-10-CM | POA: Diagnosis not present

## 2017-12-18 DIAGNOSIS — N5201 Erectile dysfunction due to arterial insufficiency: Secondary | ICD-10-CM | POA: Diagnosis not present

## 2017-12-18 DIAGNOSIS — N35013 Post-traumatic anterior urethral stricture: Secondary | ICD-10-CM | POA: Diagnosis not present

## 2017-12-18 DIAGNOSIS — N35011 Post-traumatic bulbous urethral stricture: Secondary | ICD-10-CM | POA: Diagnosis not present

## 2018-01-06 NOTE — Progress Notes (Deleted)
[name] [address] [place], [place] [address] Phone: [phone] Subjective:    I'm seeing this patient by the request  of:    CC:   [id number]  [name] is a 67 y.o. male coming in with complaint of ***  Onset-  Location Duration-  Character- Aggravating factors- Reliving factors-  Therapies tried-  Severity-     Past Medical History:  Diagnosis Date  . Anemia   . Anemia   . Benign colon polyp 2012  . Clot    STATED TOLD IN APRIL CLOT TO LEFT SHOULDER. DOCTOR MADE AWARE  . Gout   . Headache(784.0)   . 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 HTN (hypertension)   . Hyperlipidemia   . PONV (postoperative nausea and vomiting)   . Renal failure    Past Surgical History:  Procedure Laterality Date  . AV FISTULA PLACEMENT  01/19/2012   Procedure: ARTERIOVENOUS (AV) FISTULA CREATION;  Surgeon: Conrad Hayward, MD;  Location: [facility];  Service: Vascular;  Laterality: Right;  Right Brachio-cephalic arteriovenous fistula  . AV FISTULA PLACEMENT, RADIOCEPHALIC  31/51/76   Right arm  . INSERTION OF DIALYSIS CATHETER  01/19/2012   Procedure: INSERTION OF DIALYSIS CATHETER;  Surgeon: Conrad Vermilion, MD;  Location: [facility];  Service: Vascular;  Laterality: N/A;  right internal jugular vein  . 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   Social History   Socioeconomic History  . Marital status: Married    Spouse name: Not on file  . Number of children: 5  . Years of education: Not on file  . Highest education level: Not on file  Occupational History  . 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 Financial resource strain: Not on file  . Food insecurity:    Worry: Not on file    Inability: Not on file  . Transportation needs:    Medical: Not on file    Non-medical: Not on file  Tobacco Use  . Smoking status: Never Smoker  . Smokeless tobacco: Never Used  Substance and Sexual Activity  .  Alcohol use: No    Comment: 1 drink a month or non  . Drug use: No  . Sexual activity: Yes    Birth control/protection: Condom  Lifestyle  . Physical activity:    Days per week: Not on file    Minutes per session: Not on file  . Stress: Not on file  Relationships  . Social connections:    Talks on phone: Not on file    Gets together: Not on file    Attends religious service: Not on file    Active member of club or organization: Not on file    Attends meetings of clubs or organizations: Not on file    Relationship status: Not on file  Other Topics Concern  . Not on file  Social History Narrative   Regular Exercise -  YES         Allergies  Allergen Reactions  . Hydrocodone Itching   Family History  Problem Relation Age of Onset  . Diabetes Father   . Colon cancer Neg Hx   . Esophageal cancer Neg Hx   . Stomach cancer Neg Hx   . Rectal cancer Neg Hx      Past medical history, social, surgical and family history all reviewed in electronic medical record.  No pertanent information unless  stated regarding to the chief complaint.   Review of Systems:Review of systems updated and as accurate as of 01/06/18  No headache, visual changes, nausea, vomiting, diarrhea, constipation, dizziness, abdominal pain, skin rash, fevers, chills, night sweats, weight loss, swollen lymph nodes, body aches, joint swelling, muscle aches, chest pain, shortness of breath, mood changes.   Objective  There were no vitals taken for this visit. Systems examined below as of 01/06/18   General: No apparent distress alert and oriented x3 mood and affect normal, dressed appropriately.  HEENT: Pupils equal, extraocular movements intact  Respiratory: Patient's speak in full sentences and does not appear short of breath  Cardiovascular: No lower extremity edema, non tender, no erythema  Skin: Warm dry intact with no signs of infection or rash on extremities or on axial skeleton.  Abdomen: Soft nontender   Neuro: Cranial nerves II through XII are intact, neurovascularly intact in all extremities with 2+ DTRs and 2+ pulses.  Lymph: No lymphadenopathy of posterior or anterior cervical chain or axillae bilaterally.  Gait normal with good balance and coordination.  MSK:  Non tender with full range of motion and good stability and symmetric strength and tone of shoulders, elbows, wrist, hip, knee and ankles bilaterally.     Impression and Recommendations:     This case required medical decision making of moderate complexity.      Note: This dictation was prepared with Dragon dictation along with smaller phrase technology. Any transcriptional errors that result from this process are unintentional.

## 2018-01-07 ENCOUNTER — Ambulatory Visit: Payer: BLUE CROSS/BLUE SHIELD | Admitting: Family Medicine

## 2018-01-07 DIAGNOSIS — Z0289 Encounter for other administrative examinations: Secondary | ICD-10-CM

## 2018-01-18 ENCOUNTER — Other Ambulatory Visit (INDEPENDENT_AMBULATORY_CARE_PROVIDER_SITE_OTHER): Payer: BLUE CROSS/BLUE SHIELD

## 2018-01-18 DIAGNOSIS — E1165 Type 2 diabetes mellitus with hyperglycemia: Secondary | ICD-10-CM

## 2018-01-18 DIAGNOSIS — Z794 Long term (current) use of insulin: Secondary | ICD-10-CM | POA: Diagnosis not present

## 2018-01-18 LAB — BASIC METABOLIC PANEL
BUN: 22 mg/dL (ref 6–23)
CALCIUM: 9.9 mg/dL (ref 8.4–10.5)
CHLORIDE: 104 meq/L (ref 96–112)
CO2: 28 mEq/L (ref 19–32)
CREATININE: 1.95 mg/dL — AB (ref 0.40–1.50)
GFR: 44.63 mL/min — ABNORMAL LOW (ref >60.00)
Glucose, Bld: 140 mg/dL — ABNORMAL HIGH (ref 70–99)
Potassium: 3.4 mEq/L — ABNORMAL LOW (ref 3.5–5.1)
Sodium: 142 mEq/L (ref 135–145)

## 2018-01-18 LAB — HEMOGLOBIN A1C: Hgb A1c MFr Bld: 7.9 % — ABNORMAL HIGH (ref 4.6–6.5)

## 2018-01-21 ENCOUNTER — Ambulatory Visit (INDEPENDENT_AMBULATORY_CARE_PROVIDER_SITE_OTHER): Payer: BLUE CROSS/BLUE SHIELD | Admitting: Endocrinology

## 2018-01-21 ENCOUNTER — Encounter: Payer: Self-pay | Admitting: Endocrinology

## 2018-01-21 VITALS — BP 150/88 | HR 84 | Ht 70.0 in | Wt 214.0 lb

## 2018-01-21 DIAGNOSIS — Z794 Long term (current) use of insulin: Secondary | ICD-10-CM

## 2018-01-21 DIAGNOSIS — N184 Chronic kidney disease, stage 4 (severe): Secondary | ICD-10-CM | POA: Diagnosis not present

## 2018-01-21 DIAGNOSIS — E1165 Type 2 diabetes mellitus with hyperglycemia: Secondary | ICD-10-CM

## 2018-01-21 NOTE — Patient Instructions (Addendum)
Tresiba 32 at [employer] 10 AT BFST, 14 AT LUNCH AND 10 BEFORE DINEER  Check blood sugars on waking up  3/7 DAYS  Also check blood sugars about 2 hours after a meal and do this after different meals by rotation  Recommended blood sugar levels on waking up is 90-130 and about 2 hours after meal is 130-160  Please bring your blood sugar monitor to each visit, thank you  Walk 20 min daily

## 2018-01-21 NOTE — Progress Notes (Signed)
Patient ID: [name], male   DOB: July 21, 1951, 67 y.o.   MRN: [id number]          Reason for Appointment: Follow-up for Type 2 Diabetes  Referring physician: Scarlette Calico   History of Present Illness:          Date of diagnosis of type 2 diabetes mellitus: ?  2004        Background history:   Apparently the patient had hyperglycemia in 2004 but details are not available.  He was treated with some oral medications and diet and this apparently resolved soon without any recurrence until recently The patient was starting to have weight loss, increased thirst, increased urination and blurred vision in June 2018 and his glucose was 388 done by his nephrologist along with an A1c of 15  Recent history:    INSULIN regimen is: [place]  30 units daily, Humalog insulin 10 units 2  times a day      Non-insulin hypoglycemic drugs the patient is taking are: None  His A1c is now 7.9 compared to 6.7 previously Previously highest 8.3 done in September 2018   Current management, blood sugar patterns and problems identified:   He has not brought his blood sugar monitor for download  He is inconsistent in stating what his blood sugars are at various times and unclear if he is taking his blood sugars consistently especially after meals  He has been explained the need for taking HUMALOG with every meal but he is not taking this at suppertime usually  He is taking this mostly at breakfast and lunch  However he reports highest blood sugars AFTER lunch and some after dinner also  He thinks his FASTING blood sugars are about 140-150  Not clear why his fasting readings are not consistently better even with taking his Tyler Aas consistently at night, may be related to higher readings after supper  Also was supposed to be taking 32 units of [place] and is only taking 30  He has probably done a little better with his diet with some weight loss  However does not do any formal exercise and does  not appear to be physically active during the day   Side effects from medications have been:None  Compliance with the medical regimen:  inconsistent   Glucose monitoring:  done up to 2  times a day         Glucometer: Accucheck Glucose readings by recall:   PRE-MEAL Fasting Lunch Dinner Bedtime Overall  Glucose range: 140  150 180   Mean/median:        POST-MEAL PC Breakfast PC Lunch PC Dinner  Glucose range: 120 180-200   Mean/median:        Self-care: The diet that the patient has been following is: tries to limit carbs.     Meal times are: Dinner: 8-9 P.m.  Typical meal intake: Breakfast is usually eggs, oatmeal, sausage, bacon, lunch sandwich                Dietician visit, most recent:2013                Exercise:  None, he thinks he is active during the day  Weight history:  maximum weight previously 240  Wt Readings from Last 3 Encounters:  01/21/18 214 lb (97.1 kg)  12/07/17 219 lb (99.3 kg)  11/11/17 221 lb 4 oz (100.4 kg)    Glycemic control:   Lab Results  Component Value Date   HGBA1C  7.9 (H) 01/18/2018   HGBA1C 6.7 (H) 10/16/2017   HGBA1C 6.1 07/17/2017   Lab Results  Component Value Date   LDLCALC 70 07/17/2017   CREATININE 1.95 (H) 01/18/2018   No results found for: MICRALBCREAT  Lab Results  Component Value Date   FRUCTOSAMINE 303 (H) 10/16/2017   FRUCTOSAMINE 315 (H) 05/18/2017      Allergies as of 01/21/2018      Reactions   Hydrocodone Itching      Medication List        Accurate as of 01/21/18  4:36 PM. Always use your most recent med list.          ACCU-CHEK AVIVA PLUS w/Device Kit Use as directed to check blood sugars   ACCU-CHEK SOFTCLIX LANCETS lancets 1 each by Other route 2 (two) times daily. Use to check blood sugars twice a day   acetaminophen 500 MG tablet Commonly known as:  TYLENOL Take 1,000 mg by mouth.   amLODipine 5 MG tablet Commonly known as:  NORVASC Take 5 mg by mouth daily.   atorvastatin 10  MG tablet Commonly known as:  LIPITOR Take 10 mg by mouth daily.   calcitRIOL 0.5 MCG capsule Commonly known as:  ROCALTROL Take 0.5 mcg by mouth daily.   carvedilol 25 MG tablet Commonly known as:  COREG Take 37.5 mg by mouth 2 (two) times daily with a meal.   ACCU-CHEK GUIDE test strip Generic drug:  glucose blood 1 each by Other route 3 (three) times daily. Use to check blood sugars 4-5 times daily   glucose blood test strip Use TID   Insulin Degludec 200 UNIT/ML Sopn Commonly known as:  TRESIBA FLEXTOUCH Inject 30 Units into the skin daily.   insulin lispro 100 UNIT/ML KiwkPen Commonly known as:  HUMALOG KWIKPEN INJECT 15 UNITS BEFORE MEALS   Insulin Pen Needle 32G X 6 MM Misc Commonly known as:  NOVOFINE 1 Act by Does not apply route 4 (four) times daily.   latanoprost 0.005 % ophthalmic solution Commonly known as:  XALATAN   losartan 50 MG tablet Commonly known as:  COZAAR Take 50 mg by mouth daily.   magnesium oxide 400 MG tablet Commonly known as:  MAG-OX Take 400 mg by mouth daily.   mycophenolate 180 MG EC tablet Commonly known as:  MYFORTIC Take 540 mg by mouth 2 (two) times daily.   tacrolimus 1 MG capsule Commonly known as:  PROGRAF Take 6-7 mg by mouth 2 (two) times daily. Take 41m in the morning and 62min the evening   Vitamin D (Ergocalciferol) 50000 units Caps capsule Commonly known as:  DRISDOL Take by mouth.       Allergies:  Allergies  Allergen Reactions  . Hydrocodone Itching    Past Medical History:  Diagnosis Date  . Anemia   . Anemia   . Benign colon polyp 2012  . Clot    STATED TOLD IN APRIL CLOT TO LEFT SHOULDER. DOCTOR MADE AWARE  . Gout   . Headache(784.0)   . 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 HTN (hypertension)   . Hyperlipidemia   . PONV (postoperative nausea and vomiting)   . Renal failure     Past Surgical History:  Procedure Laterality Date  . AV FISTULA PLACEMENT   01/19/2012   Procedure: ARTERIOVENOUS (AV) FISTULA CREATION;  Surgeon: BrConrad BurlingtonMD;  Location: [facility] Service: Vascular;  Laterality: Right;  Right Brachio-cephalic arteriovenous fistula  .  AV FISTULA PLACEMENT, RADIOCEPHALIC  82/95/62   Right arm  . INSERTION OF DIALYSIS CATHETER  01/19/2012   Procedure: INSERTION OF DIALYSIS CATHETER;  Surgeon: Conrad Verona, MD;  Location: [facility];  Service: Vascular;  Laterality: N/A;  right internal jugular vein  . 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    Family History  Problem Relation Age of Onset  . Diabetes Father   . Colon cancer Neg Hx   . Esophageal cancer Neg Hx   . Stomach cancer Neg Hx   . Rectal cancer Neg Hx     Social History:  reports that he has never smoked. He has never used smokeless tobacco. He reports that he does not drink alcohol or use drugs.   Review of Systems   Lipid history: His PCP has treated him with Lipitor 10 mg daily    Lab Results  Component Value Date   CHOL 135 07/17/2017   HDL 46.70 07/17/2017   LDLCALC 70 07/17/2017   LDLDIRECT 75.0 05/18/2017   TRIG 94.0 07/17/2017   CHOLHDL 3 07/17/2017           Hypertension: since 2003, Followed by nephrologist  He had end-stage renal disease related to hypertension in the past, transplant done in 2015 Renal function as follows: Not clear why his potassium was slightly low  BP Readings from Last 3 Encounters:  01/21/18 (!) 150/88  12/07/17 124/68  11/11/17 140/82   Lab Results  Component Value Date   CREATININE 1.95 (H) 01/18/2018   CREATININE 2.02 (H) 07/17/2017   CREATININE 2.04 (H) 05/18/2017     Most recent eye exam was 7/18  Most recent foot exam: 9/18    LABS:  Lab on 01/18/2018  Component Date Value Ref Range Status  . Sodium 01/18/2018 142  135 - 145 mEq/L Final  . Potassium 01/18/2018 3.4* 3.5 - 5.1 mEq/L Final  . Chloride 01/18/2018 104  96 - 112 mEq/L Final  . CO2 01/18/2018 28  19 - 32 mEq/L Final  . Glucose, Bld 01/18/2018  140* 70 - 99 mg/dL Final  . BUN 01/18/2018 22  6 - 23 mg/dL Final  . Creatinine, Ser 01/18/2018 1.95* 0.40 - 1.50 mg/dL Final  . Calcium 01/18/2018 9.9  8.4 - 10.5 mg/dL Final  . GFR 01/18/2018 44.63* >60.00 mL/min Final  . Hgb A1c MFr Bld 01/18/2018 7.9* 4.6 - 6.5 % Final   Glycemic Control Guidelines for People with Diabetes:Non Diabetic:  <6%Goal of Therapy: <7%Additional Action Suggested:  >8%     Physical Examination:  BP (!) 150/88 (BP Location: Left Arm, Patient Position: Sitting, Cuff Size: Normal)   Pulse 84   Ht '5\' 10"'  (1.778 m)   Wt 214 lb (97.1 kg)   SpO2 98%   BMI 30.71 kg/m           ASSESSMENT:  Diabetes type 2, Insulin requiring     See history of present illness for detailed discussion of current diabetes management, blood sugar patterns and problems identified  A1c is higher at 7.9, previously was improved and has been progressively increasing  Most of his difficulties with controlling postprandial readings and also fasting readings are relatively high This is likely to be from his not taking insulin to cover his evening meal  Today discussed in detail the need for mealtime insulin with every meal to cover postprandial spikes, action of mealtime insulin, timing and action of the rapid acting insulin  Likely needs more insulin to cover his lunch since highest  readings are after eating even though he reports taking his insulin before eating at his lunchtime, again history may be somewhat unreliable Explained that his A1c is likely to be higher from inadequate mealtime coverage except at breakfast  Fasting readings are higher with current dose of 30 units of Tresiba    Hypertension, mild hypokalemia and renal insufficiency: To be followed by nephrologist  PLAN:    Discussed need to take insulin at suppertime and start with 10 units  He will at least take 14 units at lunchtime unless eating a small meal or low carbohydrate intake  He will continue the  same dose at breakfast  To take 32 units of Tresiba but needs to increase it another 2 to 4 units if morning sugars stay over 130  Since he does not want to use an insulin pump he will continue his multi-injection regimen  He does need to start walking or other aerobic exercise and discussed that his activity level is inadequate currently  Consider consultation with dietitian Counseling time on subjects discussed in assessment and plan sections is over 50% of today's 25 minute visit  He will follow-up with the diabetes educator in a couple of weeks to make sure he is following his instructions and make adjustments as needed   Patient Instructions  Tresiba 32 at nite  HUMALOG 10 AT BFST, 14 AT LUNCH AND 10 BEFORE DINEER  Check blood sugars on waking up  3/7 DAYS  Also check blood sugars about 2 hours after a meal and do this after different meals by rotation  Recommended blood sugar levels on waking up is 90-130 and about 2 hours after meal is 130-160  Please bring your blood sugar monitor to each visit, thank you  Walk 20 min daily    Counseling time on subjects discussed in assessment and plan sections is over 50% of today's 25 minute visit     Elayne Snare 01/21/2018, 4:36 PM   Note: This office note was prepared with Dragon voice recognition system technology. Any transcriptional errors that result from this process are unintentional.

## 2018-02-03 ENCOUNTER — Encounter: Payer: BLUE CROSS/BLUE SHIELD | Admitting: Nutrition

## 2018-02-17 DIAGNOSIS — N2581 Secondary hyperparathyroidism of renal origin: Secondary | ICD-10-CM | POA: Diagnosis not present

## 2018-02-17 DIAGNOSIS — E785 Hyperlipidemia, unspecified: Secondary | ICD-10-CM | POA: Diagnosis not present

## 2018-02-17 DIAGNOSIS — Z940 Kidney transplant status: Secondary | ICD-10-CM | POA: Diagnosis not present

## 2018-02-22 DIAGNOSIS — N183 Chronic kidney disease, stage 3 (moderate): Secondary | ICD-10-CM | POA: Diagnosis not present

## 2018-02-22 DIAGNOSIS — I129 Hypertensive chronic kidney disease with stage 1 through stage 4 chronic kidney disease, or unspecified chronic kidney disease: Secondary | ICD-10-CM | POA: Diagnosis not present

## 2018-02-22 DIAGNOSIS — N2581 Secondary hyperparathyroidism of renal origin: Secondary | ICD-10-CM | POA: Diagnosis not present

## 2018-02-22 DIAGNOSIS — D631 Anemia in chronic kidney disease: Secondary | ICD-10-CM | POA: Diagnosis not present

## 2018-02-22 DIAGNOSIS — Z940 Kidney transplant status: Secondary | ICD-10-CM | POA: Diagnosis not present

## 2018-03-09 ENCOUNTER — Other Ambulatory Visit: Payer: Self-pay | Admitting: Endocrinology

## 2018-05-13 ENCOUNTER — Other Ambulatory Visit: Payer: Self-pay | Admitting: Internal Medicine

## 2018-05-13 DIAGNOSIS — E1022 Type 1 diabetes mellitus with diabetic chronic kidney disease: Secondary | ICD-10-CM

## 2018-05-13 DIAGNOSIS — IMO0002 Reserved for concepts with insufficient information to code with codable children: Secondary | ICD-10-CM

## 2018-05-13 DIAGNOSIS — N184 Chronic kidney disease, stage 4 (severe): Principal | ICD-10-CM

## 2018-05-13 DIAGNOSIS — E1065 Type 1 diabetes mellitus with hyperglycemia: Principal | ICD-10-CM

## 2018-05-14 NOTE — Telephone Encounter (Signed)
Pt see endocrinology for DM management.

## 2018-06-23 DIAGNOSIS — E785 Hyperlipidemia, unspecified: Secondary | ICD-10-CM | POA: Diagnosis not present

## 2018-06-23 DIAGNOSIS — Z940 Kidney transplant status: Secondary | ICD-10-CM | POA: Diagnosis not present

## 2018-06-23 DIAGNOSIS — N2581 Secondary hyperparathyroidism of renal origin: Secondary | ICD-10-CM | POA: Diagnosis not present

## 2018-06-23 LAB — VITAMIN D 25 HYDROXY (VIT D DEFICIENCY, FRACTURES): Vit D, 25-Hydroxy: 34.7

## 2018-06-23 LAB — FECAL OCCULT BLOOD, GUAIAC: FECAL OCCULT BLD: POSITIVE

## 2018-06-23 LAB — HEPATIC FUNCTION PANEL
ALT: 21 (ref 10–40)
AST: 37 (ref 14–40)
Alkaline Phosphatase: 87 (ref 25–125)
Bilirubin, Total: 0.6

## 2018-06-23 LAB — CBC AND DIFFERENTIAL
HCT: 46 (ref 41–53)
Hemoglobin: 15 (ref 13.5–17.5)
Neutrophils Absolute: 2
Platelets: 149 — AB (ref 150–399)
WBC: 3.8

## 2018-06-23 LAB — BASIC METABOLIC PANEL
BUN: 26 — AB (ref 4–21)
CREATININE: 2 — AB (ref 0.6–1.3)
Potassium: 3.9 (ref 3.4–5.3)
Sodium: 143 (ref 137–147)

## 2018-06-24 LAB — LIPID PANEL
Cholesterol: 163 (ref 0–200)
HDL: 57 (ref 35–70)
LDL Cholesterol: 89
Triglycerides: 87 (ref 40–160)

## 2018-07-07 DIAGNOSIS — D631 Anemia in chronic kidney disease: Secondary | ICD-10-CM | POA: Diagnosis not present

## 2018-07-07 DIAGNOSIS — N183 Chronic kidney disease, stage 3 (moderate): Secondary | ICD-10-CM | POA: Diagnosis not present

## 2018-07-07 DIAGNOSIS — I129 Hypertensive chronic kidney disease with stage 1 through stage 4 chronic kidney disease, or unspecified chronic kidney disease: Secondary | ICD-10-CM | POA: Diagnosis not present

## 2018-07-07 DIAGNOSIS — N2581 Secondary hyperparathyroidism of renal origin: Secondary | ICD-10-CM | POA: Diagnosis not present

## 2018-07-07 DIAGNOSIS — Z940 Kidney transplant status: Secondary | ICD-10-CM | POA: Diagnosis not present

## 2018-07-12 ENCOUNTER — Telehealth: Payer: Self-pay

## 2018-07-12 ENCOUNTER — Telehealth: Payer: Self-pay | Admitting: Endocrinology

## 2018-07-12 NOTE — Telephone Encounter (Signed)
Patient had labs done at [facility] with Dr. Posey Pronto 2 weeks ago. Please Advise patient with further lab work, thanks.

## 2018-07-12 NOTE — Telephone Encounter (Signed)
Needs to make appointment for labs and follow-up first

## 2018-07-12 NOTE — Telephone Encounter (Signed)
Pt has not been seen in 6 months and has no upcoming appt's and insurance wants PA for Humalog. Would you like to do PA for see the pt first?

## 2018-07-12 NOTE — Telephone Encounter (Signed)
Would you please call this pt and schedule them?

## 2018-07-12 NOTE — Telephone Encounter (Signed)
[facility] is faxing most recent labs to this office.

## 2018-07-16 ENCOUNTER — Encounter: Payer: Self-pay | Admitting: Endocrinology

## 2018-07-16 NOTE — Progress Notes (Signed)
Dr. Patel/thx dmf

## 2018-07-19 ENCOUNTER — Ambulatory Visit (INDEPENDENT_AMBULATORY_CARE_PROVIDER_SITE_OTHER): Payer: BLUE CROSS/BLUE SHIELD | Admitting: Endocrinology

## 2018-07-19 ENCOUNTER — Other Ambulatory Visit: Payer: Self-pay

## 2018-07-19 ENCOUNTER — Other Ambulatory Visit: Payer: Self-pay | Admitting: Endocrinology

## 2018-07-19 ENCOUNTER — Telehealth: Payer: Self-pay | Admitting: Endocrinology

## 2018-07-19 ENCOUNTER — Encounter: Payer: Self-pay | Admitting: Endocrinology

## 2018-07-19 VITALS — BP 140/80 | HR 64 | Ht 70.0 in | Wt 218.2 lb

## 2018-07-19 DIAGNOSIS — N184 Chronic kidney disease, stage 4 (severe): Principal | ICD-10-CM

## 2018-07-19 DIAGNOSIS — IMO0002 Reserved for concepts with insufficient information to code with codable children: Secondary | ICD-10-CM

## 2018-07-19 DIAGNOSIS — E1022 Type 1 diabetes mellitus with diabetic chronic kidney disease: Secondary | ICD-10-CM

## 2018-07-19 DIAGNOSIS — E1065 Type 1 diabetes mellitus with hyperglycemia: Principal | ICD-10-CM

## 2018-07-19 DIAGNOSIS — Z794 Long term (current) use of insulin: Secondary | ICD-10-CM | POA: Diagnosis not present

## 2018-07-19 DIAGNOSIS — E1165 Type 2 diabetes mellitus with hyperglycemia: Secondary | ICD-10-CM

## 2018-07-19 LAB — GLUCOSE, POCT (MANUAL RESULT ENTRY): POC GLUCOSE: 130 mg/dL — AB (ref 70–99)

## 2018-07-19 LAB — POCT GLYCOSYLATED HEMOGLOBIN (HGB A1C): Hemoglobin A1C: 6.3 % — AB (ref 4.0–5.6)

## 2018-07-19 MED ORDER — INSULIN DEGLUDEC 200 UNIT/ML ~~LOC~~ SOPN: 32.0000 [IU] | PEN_INJECTOR | Freq: Every day | SUBCUTANEOUS | 3 refills | Status: DC

## 2018-07-19 NOTE — Telephone Encounter (Signed)
Please advise 

## 2018-07-19 NOTE — Patient Instructions (Addendum)
Check blood sugars on waking up 3 days a week  Also check blood sugars about 2 hours after meals and do this after different meals by rotation  Recommended blood sugar levels on waking up are 90-130 and about 2 hours after meal is 130-160  Please bring your blood sugar monitor to each visit, thank you  Call eye Dr for exam

## 2018-07-19 NOTE — Telephone Encounter (Signed)
Sent in new prescription for [name] and requested the patient call back if this is still too expensive

## 2018-07-19 NOTE — Telephone Encounter (Signed)
Patient stated that Insurance will not cover RX's prescribed. Please Advise, thanks Patient Listed: "-Lantus Solarstar $109.00 -Levemir FlexTouch $118.00 -Toujdo $99 -Tyler Aas $99"

## 2018-07-19 NOTE — Progress Notes (Signed)
Patient ID: [name], male   DOB: 1950/10/21, 67 y.o.   MRN: [id number]          Reason for Appointment: Follow-up for Type 2 Diabetes  Referring physician: Scarlette Calico   History of Present Illness:          Date of diagnosis of type 2 diabetes mellitus: ?  2004        Background history:   Apparently the patient had hyperglycemia in 2004 but details are not available.  He was treated with some oral medications and diet and this apparently resolved soon without any recurrence until recently The patient was starting to have weight loss, increased thirst, increased urination and blurred vision in June 2018 and his glucose was 388 done by his nephrologist along with an A1c of 15  Recent history:    INSULIN regimen is: [place]  32 units daily, Humalog insulin 15 units 3 times a day      Non-insulin hypoglycemic drugs the patient is taking are: None  His A1c is now   7.9 compared to 6.7 previously Previously highest 8.3 done in September 2018   Current management, blood sugar patterns and problems identified:   He says his Accu-Chek meter was not working and he started using his One Touch meter 3 days ago and no significant data is available for review today  However he has taken Humalog with his meals consistently now and the dose has been increased by 5 units since last visit  He says that his wife is helping him watch his portions at meals  However since his last visit he has gained about 4 pounds  Although his morning sugars on the meter look mostly high the last 2 days he says that his fasting readings are usually fairly good and only 130 today  However does not do any formal exercise and does not appear to be physically active during the day He is trying to have less snacks recently  Side effects from medications have been:None  Compliance with the medical regimen:  inconsistent   Glucose monitoring:  done up to 2  times a day         Glucometer: One Touch  ultra mini, was on Accucheck Glucose readings by download of meter:  Morning recently 170, 181 After dinner 120-209 with only 3 readings  Self-care: The diet that the patient has been following is: tries to limit carbs.     Meal times are: Dinner: 8-9 P.m.  Typical meal intake: Breakfast is usually eggs, oatmeal, sausage, bacon, lunch sandwich                  Dietician visit, most recent:2013                Exercise:  None, he thinks he is active during the day  Weight history:  maximum weight previously 240  Wt Readings from Last 3 Encounters:  07/19/18 218 lb 3.2 oz (99 kg)  01/21/18 214 lb (97.1 kg)  12/07/17 219 lb (99.3 kg)    Glycemic control:   Lab Results  Component Value Date   HGBA1C 6.3 (A) 07/19/2018   HGBA1C 7.9 (H) 01/18/2018   HGBA1C 6.7 (H) 10/16/2017   Lab Results  Component Value Date   LDLCALC 89 06/23/2018   CREATININE 2.0 (A) 06/23/2018   No results found for: MICRALBCREAT  Lab Results  Component Value Date   FRUCTOSAMINE 303 (H) 10/16/2017   FRUCTOSAMINE 315 (H) 05/18/2017  Allergies as of 07/19/2018      Reactions   Hydrocodone Itching      Medication List        Accurate as of 07/19/18  9:00 AM. Always use your most recent med list.          ACCU-CHEK AVIVA PLUS w/Device Kit Use as directed to check blood sugars   ACCU-CHEK SOFTCLIX LANCETS lancets 1 each by Other route 2 (two) times daily. Use to check blood sugars twice a day   acetaminophen 500 MG tablet Commonly known as:  TYLENOL Take 1,000 mg by mouth.   amLODipine 5 MG tablet Commonly known as:  NORVASC Take 5 mg by mouth daily.   atorvastatin 10 MG tablet Commonly known as:  LIPITOR Take 10 mg by mouth daily.   calcitRIOL 0.5 MCG capsule Commonly known as:  ROCALTROL Take 0.5 mcg by mouth daily.   carvedilol 25 MG tablet Commonly known as:  COREG Take 37.5 mg by mouth 2 (two) times daily with a meal.   glucose blood test strip Use TID   Insulin  Degludec 200 UNIT/ML Sopn Inject 30 Units into the skin daily.   insulin lispro 100 UNIT/ML KiwkPen Commonly known as:  HUMALOG INJECT 15 UNITS INTO THE SKIN BEFORE MEALS.   Insulin Pen Needle 32G X 6 MM Misc 1 Act by Does not apply route 4 (four) times daily.   latanoprost 0.005 % ophthalmic solution Commonly known as:  XALATAN   losartan 50 MG tablet Commonly known as:  COZAAR Take 50 mg by mouth daily.   magnesium oxide 400 MG tablet Commonly known as:  MAG-OX Take 400 mg by mouth daily.   mycophenolate 180 MG EC tablet Commonly known as:  MYFORTIC Take 540 mg by mouth 2 (two) times daily.   tacrolimus 1 MG capsule Commonly known as:  PROGRAF Take 6-7 mg by mouth 2 (two) times daily. Take 36m in the morning and 667min the evening   Vitamin D (Ergocalciferol) 1.25 MG (50000 UT) Caps capsule Commonly known as:  DRISDOL Take by mouth.       Allergies:  Allergies  Allergen Reactions  . Hydrocodone Itching    Past Medical History:  Diagnosis Date  . Anemia   . Anemia   . Benign colon polyp 2012  . Clot    STATED TOLD IN APRIL CLOT TO LEFT SHOULDER. DOCTOR MADE AWARE  . Gout   . Headache(784.0)   . 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 HTN (hypertension)   . Hyperlipidemia   . PONV (postoperative nausea and vomiting)   . Renal failure     Past Surgical History:  Procedure Laterality Date  . AV FISTULA PLACEMENT  01/19/2012   Procedure: ARTERIOVENOUS (AV) FISTULA CREATION;  Surgeon: BrConrad BurlingtonMD;  [facility];  Laterality: Right;  Right Brachio-cephalic arteriovenous fistula  . AV FISTULA PLACEMENT, RADIOCEPHALIC  0588/89/16 Right arm  . INSERTION OF DIALYSIS CATHETER  01/19/2012   Procedure: INSERTION OF DIALYSIS CATHETER;  Surgeon: BrConrad BurlingtonMD;  [facility];  Laterality: N/A;  right internal jugular vein  . 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    Family History  Problem  Relation Age of Onset  . Diabetes Father   . Colon cancer Neg Hx   . Esophageal cancer Neg Hx   . Stomach cancer Neg Hx   . Rectal cancer Neg Hx  Social History:  reports that he has never smoked. He has never used smokeless tobacco. He reports that he does not drink alcohol or use drugs.   Review of Systems   Lipid history: His PCP has treated him with Lipitor 10 mg daily Recent LDL okay   Lab Results  Component Value Date   CHOL 163 06/23/2018   HDL 57 06/23/2018   LDLCALC 89 06/23/2018   LDLDIRECT 75.0 05/18/2017   TRIG 87 06/23/2018   CHOLHDL 3 07/17/2017           Hypertension: since 2003, Followed by nephrologist  He had end-stage renal disease related to hypertension in the past, transplant done in 2015 Renal function as follows:  BP Readings from Last 3 Encounters:  07/19/18 140/80  01/21/18 (!) 150/88  12/07/17 124/68   Lab Results  Component Value Date   CREATININE 2.0 (A) 06/23/2018   CREATININE 1.95 (H) 01/18/2018   CREATININE 2.02 (H) 07/17/2017     Most recent eye exam was 7/18  Most recent foot exam: 9/18    LABS:  Office Visit on 07/19/2018  Component Date Value Ref Range Status  . Hemoglobin A1C 07/19/2018 6.3* 4.0 - 5.6 % Final  . POC Glucose 07/19/2018 130* 70 - 99 mg/dl Final  Abstract on 07/16/2018  Component Date Value Ref Range Status  . Hemoglobin 06/23/2018 15.0  13.5 - 17.5 Final  . HCT 06/23/2018 46  41 - 53 Final  . Neutrophils Absolute 06/23/2018 2   Final  . Platelets 06/23/2018 149* 150 - 399 Final  . WBC 06/23/2018 3.8   Final  . Vit D, 25-Hydroxy 06/23/2018 34.7   Final  . Fecal Occult Blood 06/23/2018 Positive   Final   1+  . BUN 06/23/2018 26* 4 - 21 Final  . Creatinine 06/23/2018 2.0* 0.6 - 1.3 Final  . Potassium 06/23/2018 3.9  3.4 - 5.3 Final  . Sodium 06/23/2018 143  137 - 147 Final  . Triglycerides 06/23/2018 87  40 - 160 Final  . Cholesterol 06/23/2018 163  0 - 200 Final  . HDL 06/23/2018 57  35 - 70  Final  . LDL Cholesterol 06/23/2018 89   Final  . Alkaline Phosphatase 06/23/2018 87  25 - 125 Final  . ALT 06/23/2018 21  10 - 40 Final  . AST 06/23/2018 37  14 - 40 Final  . Bilirubin, Total 06/23/2018 0.6   Final    Physical Examination:  BP 140/80 (BP Location: Left Arm, Patient Position: Sitting, Cuff Size: Normal)   Pulse 64   Ht '5\' 10"'  (1.778 m)   Wt 218 lb 3.2 oz (99 kg)   SpO2 98%   BMI 31.31 kg/m           ASSESSMENT:  Diabetes type 2, Insulin requiring     See history of present illness for detailed discussion of current diabetes management, blood sugar patterns and problems identified  A1c is significantly improved at 6.3, previously 7.9  He is taking his mealtime insulin more consistently as prescribed previously and also appears to be improving his diet However he has gained weight Although he may benefit from a consultation with dietitian he says his wife will not agree to this and is trying to improve his diet He can do better with consistent exercise at least on the days he is not working Unable to have a proper review of his home blood sugars since he is just starting to use a new meter and  does not have any consistent abnormal patterns even though he has had some high readings in the mornings  LIPIDS: LDL was 89 on the last measurement  PLAN:   Discussed need to check blood sugars consistently at various times and advised him on targets fasting and after meals To adjust mealtime dose based on portions and carbohydrates No change in basal insulin unless fasting readings are consistently over 130 More regular follow-up   Patient Instructions  Check blood sugars on waking up 3 days a week  Also check blood sugars about 2 hours after meals and do this after different meals by rotation  Recommended blood sugar levels on waking up are 90-130 and about 2 hours after meal is 130-160  Please bring your blood sugar monitor to each visit, thank you             Elayne Snare 07/19/2018, 9:00 AM   Note: This office note was prepared with Dragon voice recognition system technology. Any transcriptional errors that result from this process are unintentional.

## 2018-07-19 NOTE — Telephone Encounter (Signed)
He needs to continue [place] which appears to be covered

## 2018-10-17 ENCOUNTER — Other Ambulatory Visit: Payer: Self-pay | Admitting: Endocrinology

## 2018-10-18 ENCOUNTER — Ambulatory Visit: Payer: BLUE CROSS/BLUE SHIELD | Admitting: Endocrinology

## 2018-10-22 DIAGNOSIS — Z940 Kidney transplant status: Secondary | ICD-10-CM | POA: Diagnosis not present

## 2018-10-22 DIAGNOSIS — I129 Hypertensive chronic kidney disease with stage 1 through stage 4 chronic kidney disease, or unspecified chronic kidney disease: Secondary | ICD-10-CM | POA: Diagnosis not present

## 2018-10-22 DIAGNOSIS — N2581 Secondary hyperparathyroidism of renal origin: Secondary | ICD-10-CM | POA: Diagnosis not present

## 2018-12-06 ENCOUNTER — Ambulatory Visit: Payer: BLUE CROSS/BLUE SHIELD | Admitting: Endocrinology

## 2018-12-17 DIAGNOSIS — Z940 Kidney transplant status: Secondary | ICD-10-CM | POA: Diagnosis not present

## 2018-12-20 ENCOUNTER — Other Ambulatory Visit: Payer: Self-pay | Admitting: Endocrinology

## 2019-06-28 DIAGNOSIS — N390 Urinary tract infection, site not specified: Secondary | ICD-10-CM | POA: Diagnosis not present

## 2019-11-07 DIAGNOSIS — Z940 Kidney transplant status: Secondary | ICD-10-CM | POA: Diagnosis not present

## 2019-11-07 DIAGNOSIS — N2581 Secondary hyperparathyroidism of renal origin: Secondary | ICD-10-CM | POA: Diagnosis not present

## 2019-11-07 DIAGNOSIS — I129 Hypertensive chronic kidney disease with stage 1 through stage 4 chronic kidney disease, or unspecified chronic kidney disease: Secondary | ICD-10-CM | POA: Diagnosis not present

## 2019-11-18 ENCOUNTER — Telehealth: Payer: Self-pay | Admitting: Internal Medicine

## 2019-11-18 NOTE — Telephone Encounter (Signed)
Home nurse is calling to report a finding.  A1C is 11 She has been informed patient has not been seen since 2019. &Informed patient will need to set up an appointment with Dr.Jones.

## 2020-03-07 DIAGNOSIS — I129 Hypertensive chronic kidney disease with stage 1 through stage 4 chronic kidney disease, or unspecified chronic kidney disease: Secondary | ICD-10-CM | POA: Diagnosis not present

## 2020-03-07 DIAGNOSIS — N2581 Secondary hyperparathyroidism of renal origin: Secondary | ICD-10-CM | POA: Diagnosis not present

## 2020-03-07 DIAGNOSIS — N390 Urinary tract infection, site not specified: Secondary | ICD-10-CM | POA: Diagnosis not present

## 2020-03-07 DIAGNOSIS — E1122 Type 2 diabetes mellitus with diabetic chronic kidney disease: Secondary | ICD-10-CM | POA: Diagnosis not present

## 2020-03-07 DIAGNOSIS — Z940 Kidney transplant status: Secondary | ICD-10-CM | POA: Diagnosis not present

## 2020-03-07 LAB — LIPID PANEL
Cholesterol: 148 (ref 0–200)
HDL: 51 (ref 35–70)
LDL Cholesterol: 92
Triglycerides: 81 (ref 40–160)

## 2020-03-07 LAB — HEMOGLOBIN A1C: Hemoglobin A1C: 6.7

## 2020-03-29 DIAGNOSIS — Z940 Kidney transplant status: Secondary | ICD-10-CM | POA: Diagnosis not present

## 2020-03-29 LAB — BASIC METABOLIC PANEL
BUN: 53 — AB (ref 4–21)
Creatinine: 4.1 — AB (ref <=1.3)
Glucose: 102

## 2020-03-30 DIAGNOSIS — N12 Tubulo-interstitial nephritis, not specified as acute or chronic: Secondary | ICD-10-CM | POA: Diagnosis not present

## 2020-03-30 DIAGNOSIS — M109 Gout, unspecified: Secondary | ICD-10-CM | POA: Diagnosis not present

## 2020-03-30 DIAGNOSIS — N189 Chronic kidney disease, unspecified: Secondary | ICD-10-CM | POA: Diagnosis not present

## 2020-03-30 DIAGNOSIS — D84821 Immunodeficiency due to drugs: Secondary | ICD-10-CM | POA: Diagnosis not present

## 2020-03-30 DIAGNOSIS — N051 Unspecified nephritic syndrome with focal and segmental glomerular lesions: Secondary | ICD-10-CM | POA: Diagnosis not present

## 2020-03-30 DIAGNOSIS — Z8249 Family history of ischemic heart disease and other diseases of the circulatory system: Secondary | ICD-10-CM | POA: Diagnosis not present

## 2020-03-30 DIAGNOSIS — Z79899 Other long term (current) drug therapy: Secondary | ICD-10-CM | POA: Diagnosis not present

## 2020-03-30 DIAGNOSIS — Z940 Kidney transplant status: Secondary | ICD-10-CM | POA: Diagnosis not present

## 2020-03-30 DIAGNOSIS — K5900 Constipation, unspecified: Secondary | ICD-10-CM | POA: Diagnosis not present

## 2020-03-30 DIAGNOSIS — Z7952 Long term (current) use of systemic steroids: Secondary | ICD-10-CM | POA: Diagnosis not present

## 2020-03-30 DIAGNOSIS — T8619 Other complication of kidney transplant: Secondary | ICD-10-CM | POA: Diagnosis not present

## 2020-03-30 DIAGNOSIS — D696 Thrombocytopenia, unspecified: Secondary | ICD-10-CM | POA: Diagnosis not present

## 2020-03-30 DIAGNOSIS — T380X5A Adverse effect of glucocorticoids and synthetic analogues, initial encounter: Secondary | ICD-10-CM | POA: Diagnosis not present

## 2020-03-30 DIAGNOSIS — D8489 Other immunodeficiencies: Secondary | ICD-10-CM | POA: Diagnosis not present

## 2020-03-30 DIAGNOSIS — E119 Type 2 diabetes mellitus without complications: Secondary | ICD-10-CM | POA: Diagnosis not present

## 2020-03-30 DIAGNOSIS — N1330 Unspecified hydronephrosis: Secondary | ICD-10-CM | POA: Diagnosis not present

## 2020-03-30 DIAGNOSIS — N186 End stage renal disease: Secondary | ICD-10-CM | POA: Diagnosis not present

## 2020-03-30 DIAGNOSIS — E1122 Type 2 diabetes mellitus with diabetic chronic kidney disease: Secondary | ICD-10-CM | POA: Diagnosis not present

## 2020-03-30 DIAGNOSIS — Z833 Family history of diabetes mellitus: Secondary | ICD-10-CM | POA: Diagnosis not present

## 2020-03-30 DIAGNOSIS — N170 Acute kidney failure with tubular necrosis: Secondary | ICD-10-CM | POA: Diagnosis not present

## 2020-03-30 DIAGNOSIS — Z4822 Encounter for aftercare following kidney transplant: Secondary | ICD-10-CM | POA: Diagnosis not present

## 2020-03-30 DIAGNOSIS — I120 Hypertensive chronic kidney disease with stage 5 chronic kidney disease or end stage renal disease: Secondary | ICD-10-CM | POA: Diagnosis not present

## 2020-03-30 DIAGNOSIS — R7989 Other specified abnormal findings of blood chemistry: Secondary | ICD-10-CM | POA: Diagnosis not present

## 2020-03-30 DIAGNOSIS — I129 Hypertensive chronic kidney disease with stage 1 through stage 4 chronic kidney disease, or unspecified chronic kidney disease: Secondary | ICD-10-CM | POA: Diagnosis not present

## 2020-03-30 DIAGNOSIS — N185 Chronic kidney disease, stage 5: Secondary | ICD-10-CM | POA: Diagnosis not present

## 2020-03-30 DIAGNOSIS — Z794 Long term (current) use of insulin: Secondary | ICD-10-CM | POA: Diagnosis not present

## 2020-03-30 DIAGNOSIS — E1165 Type 2 diabetes mellitus with hyperglycemia: Secondary | ICD-10-CM | POA: Diagnosis not present

## 2020-03-30 DIAGNOSIS — I10 Essential (primary) hypertension: Secondary | ICD-10-CM | POA: Diagnosis not present

## 2020-03-30 DIAGNOSIS — N131 Hydronephrosis with ureteral stricture, not elsewhere classified: Secondary | ICD-10-CM | POA: Diagnosis not present

## 2020-03-30 DIAGNOSIS — Z885 Allergy status to narcotic agent status: Secondary | ICD-10-CM | POA: Diagnosis not present

## 2020-03-30 DIAGNOSIS — I160 Hypertensive urgency: Secondary | ICD-10-CM | POA: Diagnosis not present

## 2020-03-30 DIAGNOSIS — D631 Anemia in chronic kidney disease: Secondary | ICD-10-CM | POA: Diagnosis not present

## 2020-03-30 DIAGNOSIS — Z9114 Patient's other noncompliance with medication regimen: Secondary | ICD-10-CM | POA: Diagnosis not present

## 2020-03-30 DIAGNOSIS — N041 Nephrotic syndrome with focal and segmental glomerular lesions: Secondary | ICD-10-CM | POA: Diagnosis not present

## 2020-03-30 DIAGNOSIS — R011 Cardiac murmur, unspecified: Secondary | ICD-10-CM | POA: Diagnosis not present

## 2020-03-30 DIAGNOSIS — Z992 Dependence on renal dialysis: Secondary | ICD-10-CM | POA: Diagnosis not present

## 2020-03-30 DIAGNOSIS — N179 Acute kidney failure, unspecified: Secondary | ICD-10-CM | POA: Diagnosis not present

## 2020-03-30 DIAGNOSIS — B370 Candidal stomatitis: Secondary | ICD-10-CM | POA: Diagnosis not present

## 2020-03-30 DIAGNOSIS — E860 Dehydration: Secondary | ICD-10-CM | POA: Diagnosis not present

## 2020-04-01 LAB — HEMOGLOBIN A1C: Hemoglobin A1C: 6.6

## 2020-04-03 LAB — BASIC METABOLIC PANEL
CO2: 18 (ref 13–22)
Chloride: 102 (ref 99–108)
Potassium: 5 (ref 3.4–5.3)
Sodium: 135 — AB (ref 137–147)

## 2020-04-03 LAB — HEPATIC FUNCTION PANEL
ALT: 10 (ref 10–40)
AST: 10 — AB (ref 14–40)
Alkaline Phosphatase: 78 (ref 25–125)
Bilirubin, Total: 0.6

## 2020-04-03 LAB — CBC AND DIFFERENTIAL
HCT: 37 — AB (ref 41–53)
Hemoglobin: 11.8 — AB (ref 13.5–17.5)
Platelets: 128 — AB (ref 150–399)
WBC: 12.1

## 2020-04-03 LAB — COMPREHENSIVE METABOLIC PANEL: Calcium: 9 (ref 8.7–10.7)

## 2020-04-05 DIAGNOSIS — I129 Hypertensive chronic kidney disease with stage 1 through stage 4 chronic kidney disease, or unspecified chronic kidney disease: Secondary | ICD-10-CM | POA: Diagnosis not present

## 2020-04-05 DIAGNOSIS — Z940 Kidney transplant status: Secondary | ICD-10-CM | POA: Diagnosis not present

## 2020-04-12 DIAGNOSIS — N35914 Unspecified anterior urethral stricture, male: Secondary | ICD-10-CM | POA: Diagnosis not present

## 2020-04-12 DIAGNOSIS — R338 Other retention of urine: Secondary | ICD-10-CM | POA: Diagnosis not present

## 2020-04-12 DIAGNOSIS — N1339 Other hydronephrosis: Secondary | ICD-10-CM | POA: Diagnosis not present

## 2020-04-12 DIAGNOSIS — Z940 Kidney transplant status: Secondary | ICD-10-CM | POA: Diagnosis not present

## 2020-04-18 ENCOUNTER — Ambulatory Visit (INDEPENDENT_AMBULATORY_CARE_PROVIDER_SITE_OTHER): Payer: Medicare Other

## 2020-04-18 ENCOUNTER — Other Ambulatory Visit: Payer: Self-pay

## 2020-04-18 ENCOUNTER — Encounter (HOSPITAL_COMMUNITY): Payer: Self-pay | Admitting: Emergency Medicine

## 2020-04-18 ENCOUNTER — Telehealth: Payer: Self-pay

## 2020-04-18 ENCOUNTER — Ambulatory Visit (INDEPENDENT_AMBULATORY_CARE_PROVIDER_SITE_OTHER): Payer: Medicare Other | Admitting: Internal Medicine

## 2020-04-18 ENCOUNTER — Encounter: Payer: Self-pay | Admitting: Internal Medicine

## 2020-04-18 ENCOUNTER — Emergency Department (HOSPITAL_COMMUNITY)
Admission: EM | Admit: 2020-04-18 | Discharge: 2020-04-19 | Disposition: A | Discharge status: Home / Self Care | Payer: Medicare Other | Attending: Emergency Medicine | Admitting: Emergency Medicine

## 2020-04-18 VITALS — BP 126/86 | HR 64 | Temp 98.7°F | Resp 16 | Ht 70.0 in | Wt 201.0 lb

## 2020-04-18 DIAGNOSIS — R0789 Other chest pain: Secondary | ICD-10-CM | POA: Insufficient documentation

## 2020-04-18 DIAGNOSIS — E785 Hyperlipidemia, unspecified: Secondary | ICD-10-CM | POA: Diagnosis not present

## 2020-04-18 DIAGNOSIS — Z5321 Procedure and treatment not carried out due to patient leaving prior to being seen by health care provider: Secondary | ICD-10-CM | POA: Insufficient documentation

## 2020-04-18 DIAGNOSIS — R1013 Epigastric pain: Secondary | ICD-10-CM | POA: Diagnosis not present

## 2020-04-18 DIAGNOSIS — D696 Thrombocytopenia, unspecified: Secondary | ICD-10-CM

## 2020-04-18 DIAGNOSIS — E118 Type 2 diabetes mellitus with unspecified complications: Secondary | ICD-10-CM | POA: Diagnosis not present

## 2020-04-18 DIAGNOSIS — I10 Essential (primary) hypertension: Secondary | ICD-10-CM

## 2020-04-18 DIAGNOSIS — R195 Other fecal abnormalities: Secondary | ICD-10-CM

## 2020-04-18 DIAGNOSIS — Z0000 Encounter for general adult medical examination without abnormal findings: Secondary | ICD-10-CM

## 2020-04-18 LAB — TROPONIN I (HIGH SENSITIVITY)
High Sens Troponin I: 30 ng/L (ref 2–17)
Troponin I (High Sensitivity): 26 ng/L — ABNORMAL HIGH (ref <18)
Troponin I (High Sensitivity): 24 ng/L — ABNORMAL HIGH (ref <18)

## 2020-04-18 LAB — CBC WITH DIFFERENTIAL/PLATELET
Absolute Monocytes: 360 cells/uL (ref 200–950)
Basophils Absolute: 0 cells/uL (ref 0–200)
Basophils Relative: 0 %
Eosinophils Absolute: 23 cells/uL (ref 15–500)
Eosinophils Relative: 0.3 %
HCT: 28.5 % — ABNORMAL LOW (ref 38.5–50.0)
Hemoglobin: 9.1 g/dL — ABNORMAL LOW (ref 13.2–17.1)
Lymphs Abs: 435 cells/uL — ABNORMAL LOW (ref 850–3900)
MCH: 26.5 pg — ABNORMAL LOW (ref 27.0–33.0)
MCHC: 31.9 g/dL — ABNORMAL LOW (ref 32.0–36.0)
MCV: 82.8 fL (ref 80.0–100.0)
MPV: 11.2 fL (ref 7.5–12.5)
Monocytes Relative: 4.8 %
Neutro Abs: 6683 cells/uL (ref 1500–7800)
Neutrophils Relative %: 89.1 %
Platelets: 118 10*3/uL — ABNORMAL LOW (ref 140–400)
RBC: 3.44 10*6/uL — ABNORMAL LOW (ref 4.20–5.80)
RDW: 13.4 % (ref 11.0–15.0)
Total Lymphocyte: 5.8 %
WBC: 7.5 10*3/uL (ref 3.8–10.8)

## 2020-04-18 LAB — BASIC METABOLIC PANEL
Anion gap: 10 (ref 5–15)
BUN: 59 mg/dL — ABNORMAL HIGH (ref 8–23)
CO2: 22 mmol/L (ref 22–32)
Calcium: 8.9 mg/dL (ref 8.9–10.3)
Chloride: 104 mmol/L (ref 98–111)
Creatinine, Ser: 4.99 mg/dL — ABNORMAL HIGH (ref 0.61–1.24)
GFR calc Af Amer: 13 mL/min — ABNORMAL LOW (ref >60)
GFR calc non Af Amer: 11 mL/min — ABNORMAL LOW (ref >60)
Glucose, Bld: 357 mg/dL — ABNORMAL HIGH (ref 70–99)
Potassium: 5.1 mmol/L (ref 3.5–5.1)
Sodium: 136 mmol/L (ref 135–145)

## 2020-04-18 LAB — CBC
HCT: 29.1 % — ABNORMAL LOW (ref 39.0–52.0)
Hemoglobin: 8.8 g/dL — ABNORMAL LOW (ref 13.0–17.0)
MCH: 26 pg (ref 26.0–34.0)
MCHC: 30.2 g/dL (ref 30.0–36.0)
MCV: 85.8 fL (ref 80.0–100.0)
Platelets: 105 10*3/uL — ABNORMAL LOW (ref 150–400)
RBC: 3.39 MIL/uL — ABNORMAL LOW (ref 4.22–5.81)
RDW: 13 % (ref 11.5–15.5)
WBC: 7 10*3/uL (ref 4.0–10.5)
nRBC: 0 % (ref 0.0–0.2)

## 2020-04-18 LAB — D-DIMER, QUANTITATIVE: D-Dimer, Quant: 3.16 mcg/mL FEU — ABNORMAL HIGH (ref <0.50)

## 2020-04-18 NOTE — ED Triage Notes (Signed)
Patient arrives to ED with complaints of chest pressure since last Monday. Pt states he was started on some new medicine two weeks ago that he thinks caused the pressure. Pt states that he received news from his PCP that his troponin and D-Dimer was elevated.  Pts PCP suspects possible PE. Pt denies SOB and pain.
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
## 2020-04-18 NOTE — Telephone Encounter (Signed)
PT HAS A CRITICAL LAB.  TROPONIN I OF 30.  WILL INFORM PT TO GO INTO ED TONIGHT

## 2020-04-18 NOTE — Telephone Encounter (Signed)
Pt informed of results and will be going to ED

## 2020-04-19 ENCOUNTER — Encounter: Payer: Self-pay | Admitting: Internal Medicine

## 2020-04-19 NOTE — Patient Instructions (Signed)

## 2020-04-26 NOTE — Addendum Note (Signed)
Addended by: Tanaysia Bhardwaj, Dametrius Sanjuan U on: 04/26/2020 04:41 PM   Modules accepted: Orders  

## 2020-05-07 DIAGNOSIS — K219 Gastro-esophageal reflux disease without esophagitis: Secondary | ICD-10-CM | POA: Diagnosis not present

## 2020-05-07 DIAGNOSIS — R195 Other fecal abnormalities: Secondary | ICD-10-CM | POA: Diagnosis not present

## 2020-05-07 DIAGNOSIS — I10 Essential (primary) hypertension: Secondary | ICD-10-CM | POA: Diagnosis not present

## 2020-05-15 DIAGNOSIS — R195 Other fecal abnormalities: Secondary | ICD-10-CM | POA: Diagnosis not present

## 2020-05-15 LAB — HM COLONOSCOPY

## 2020-06-20 DIAGNOSIS — N183 Chronic kidney disease, stage 3 unspecified: Secondary | ICD-10-CM | POA: Diagnosis not present

## 2020-06-20 DIAGNOSIS — I120 Hypertensive chronic kidney disease with stage 5 chronic kidney disease or end stage renal disease: Secondary | ICD-10-CM | POA: Diagnosis not present

## 2020-06-20 DIAGNOSIS — N185 Chronic kidney disease, stage 5: Secondary | ICD-10-CM | POA: Diagnosis not present

## 2020-06-20 DIAGNOSIS — N390 Urinary tract infection, site not specified: Secondary | ICD-10-CM | POA: Diagnosis not present

## 2020-06-20 DIAGNOSIS — Z940 Kidney transplant status: Secondary | ICD-10-CM | POA: Diagnosis not present

## 2020-06-20 DIAGNOSIS — D631 Anemia in chronic kidney disease: Secondary | ICD-10-CM | POA: Diagnosis not present

## 2020-06-20 DIAGNOSIS — N2581 Secondary hyperparathyroidism of renal origin: Secondary | ICD-10-CM | POA: Diagnosis not present

## 2020-07-04 ENCOUNTER — Other Ambulatory Visit (HOSPITAL_COMMUNITY): Payer: Self-pay | Admitting: *Deleted

## 2020-07-04 NOTE — Discharge Instructions (Signed)

## 2020-07-06 ENCOUNTER — Ambulatory Visit (HOSPITAL_COMMUNITY)
Admission: RE | Admit: 2020-07-06 | Discharge: 2020-07-06 | Disposition: A | Discharge status: Home / Self Care | Payer: Medicare Other | Source: Ambulatory Visit | Attending: Nephrology | Admitting: Nephrology

## 2020-07-06 ENCOUNTER — Other Ambulatory Visit: Payer: Self-pay

## 2020-07-06 DIAGNOSIS — D631 Anemia in chronic kidney disease: Secondary | ICD-10-CM | POA: Diagnosis not present

## 2020-07-06 DIAGNOSIS — N185 Chronic kidney disease, stage 5: Secondary | ICD-10-CM | POA: Insufficient documentation

## 2020-07-06 MED ORDER — SODIUM CHLORIDE 0.9 % IV SOLN
510.0000 mg | INTRAVENOUS | Status: DC
  Administered 2020-07-06: 510 mg via INTRAVENOUS
  Filled 2020-07-06: qty 17

## 2020-07-09 DIAGNOSIS — N183 Chronic kidney disease, stage 3 unspecified: Secondary | ICD-10-CM | POA: Diagnosis not present

## 2020-07-09 DIAGNOSIS — N1831 Chronic kidney disease, stage 3a: Secondary | ICD-10-CM | POA: Diagnosis not present

## 2020-07-13 ENCOUNTER — Encounter (HOSPITAL_COMMUNITY)
Admission: RE | Admit: 2020-07-13 | Discharge: 2020-07-13 | Disposition: A | Discharge status: Home / Self Care | Payer: Medicare Other | Source: Ambulatory Visit | Attending: Nephrology | Admitting: Nephrology

## 2020-07-13 ENCOUNTER — Other Ambulatory Visit: Payer: Self-pay

## 2020-07-13 DIAGNOSIS — D631 Anemia in chronic kidney disease: Secondary | ICD-10-CM | POA: Insufficient documentation

## 2020-07-13 DIAGNOSIS — N185 Chronic kidney disease, stage 5: Secondary | ICD-10-CM | POA: Diagnosis not present

## 2020-07-13 MED ORDER — SODIUM CHLORIDE 0.9 % IV SOLN
510.0000 mg | INTRAVENOUS | Status: DC
  Administered 2020-07-13: 510 mg via INTRAVENOUS
  Filled 2020-07-13: qty 510

## 2020-07-16 DIAGNOSIS — I120 Hypertensive chronic kidney disease with stage 5 chronic kidney disease or end stage renal disease: Secondary | ICD-10-CM | POA: Diagnosis not present

## 2020-07-16 DIAGNOSIS — D631 Anemia in chronic kidney disease: Secondary | ICD-10-CM | POA: Diagnosis not present

## 2020-07-16 DIAGNOSIS — Z940 Kidney transplant status: Secondary | ICD-10-CM | POA: Diagnosis not present

## 2020-07-16 DIAGNOSIS — N2581 Secondary hyperparathyroidism of renal origin: Secondary | ICD-10-CM | POA: Diagnosis not present

## 2020-07-16 DIAGNOSIS — N390 Urinary tract infection, site not specified: Secondary | ICD-10-CM | POA: Diagnosis not present

## 2020-07-16 DIAGNOSIS — N185 Chronic kidney disease, stage 5: Secondary | ICD-10-CM | POA: Diagnosis not present

## 2020-07-17 ENCOUNTER — Ambulatory Visit: Payer: Medicare Other | Admitting: Podiatry

## 2020-07-17 ENCOUNTER — Ambulatory Visit (INDEPENDENT_AMBULATORY_CARE_PROVIDER_SITE_OTHER): Payer: Medicare Other

## 2020-07-17 ENCOUNTER — Other Ambulatory Visit: Payer: Self-pay

## 2020-07-17 DIAGNOSIS — M2011 Hallux valgus (acquired), right foot: Secondary | ICD-10-CM

## 2020-07-17 DIAGNOSIS — M21611 Bunion of right foot: Secondary | ICD-10-CM

## 2020-07-17 DIAGNOSIS — E118 Type 2 diabetes mellitus with unspecified complications: Secondary | ICD-10-CM

## 2020-07-17 DIAGNOSIS — M79671 Pain in right foot: Secondary | ICD-10-CM

## 2020-07-17 DIAGNOSIS — B351 Tinea unguium: Secondary | ICD-10-CM

## 2020-07-17 DIAGNOSIS — M21612 Bunion of left foot: Secondary | ICD-10-CM | POA: Diagnosis not present

## 2020-07-17 DIAGNOSIS — M79672 Pain in left foot: Secondary | ICD-10-CM

## 2020-07-17 DIAGNOSIS — M2012 Hallux valgus (acquired), left foot: Secondary | ICD-10-CM

## 2020-07-17 DIAGNOSIS — M79675 Pain in left toe(s): Secondary | ICD-10-CM

## 2020-07-17 DIAGNOSIS — M79674 Pain in right toe(s): Secondary | ICD-10-CM

## 2020-07-17 MED ORDER — CICLOPIROX 8 % EX SOLN: Freq: Every day | CUTANEOUS | 2 refills | Status: DC

## 2020-07-17 NOTE — Progress Notes (Signed)
  Subjective:  Patient ID: [name], male    DOB: 11-Jul-1951,  MRN: [id number]  Chief Complaint  Patient presents with  . Nail Problem    bilateral nails are discolored pt is concerned about possible fungus and his feet swelling. He has pain on the medial side of his left foot     69 y.o. male presents with the above complaint. History confirmed with patient.  He has type 2 diabetes.  He thinks his A1c is "7 something "it was as high as in the mid nines.  In 2015 he had a renal transplant.  Objective:  Physical Exam: warm, good capillary refill, no trophic changes or ulcerative lesions, normal DP and PT pulses and normal sensory exam.  Normal monofilament exam.  He has hypertrophic, thickened, elongated and discolored toenails with subungual debris x10.  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  He has moderate hallux valgus with semireducible hammertoe contractures of 2 through 5 bilaterally   Weightbearing radiographs of both feet: Moderate hallux valgus with hammertoe contractures 2 through 5, no evidence of fracture dislocation or major degenerative changes Assessment:   1. Foot pain, bilateral   2. Hallux valgus with bunions, left   3. Hallux valgus with bunions, right   4. Onychomycosis   5. Pain due to onychomycosis of toenails of both feet   6. Type 2 diabetes mellitus with complication, without long-term current use of insulin ([place])      Plan:  Patient was evaluated and treated and all questions answered.  Patient educated on diabetes. Discussed proper diabetic foot care and discussed risks and complications of disease. Educated patient in depth on reasons to return to the office immediately should he/she discover anything concerning or new on the feet. All questions answered. Discussed proper shoes as well.   Discussed the etiology and treatment options for the condition in detail with the patient. Educated patient on the topical  and oral treatment options for mycotic nails. Recommended debridement of the nails today. Sharp and mechanical debridement performed of all painful and mycotic nails today. Nails debrided in length and thickness using a nail nipper and a mechanical burr to level of comfort. Discussed treatment options including appropriate shoe gear. Follow up as needed for painful nails.  Majority of the pain and swelling seems to be having a secondary to the hallux valgus and hammertoe deformities he has.  I recommended we continue to monitor this as he like to avoid surgery.  Recommend he apply Voltaren gel for now.  We will discuss further in future becomes more bothersome.  Return in about 3 months (around 10/15/2020).

## 2020-07-17 NOTE — Patient Instructions (Addendum)
Look for Voltaren gel at the pharmacy over the counter or online (also known as diclofenac 1% gel). Apply to the painful areas 3-4x daily with the supplied dosing card. Allow to dry for 10 minutes before going into socks/shoes  This may help with the pain over the bunion Bunion  A bunion is a bump on the base of the big toe that forms when the bones of the big toe joint move out of position. Bunions may be small at first, but they often get larger over time. They can make walking painful. What are the causes? A bunion may be caused by:  Wearing narrow or pointed shoes that force the big toe to press against the other toes.  Abnormal foot development that causes the foot to roll inward (pronate).  Changes in the foot that are caused by certain diseases, such as rheumatoid arthritis or polio.  A foot injury. What increases the risk? The following factors may make you more likely to develop this condition:  Wearing shoes that squeeze the toes together.  Having certain diseases, such as: ? Rheumatoid arthritis. ? Polio. ? Cerebral palsy.  Having family members who have bunions.  Being born with a foot deformity, such as flat feet or low arches.  Doing activities that put a lot of pressure on the feet, such as ballet dancing. What are the signs or symptoms? The main symptom of a bunion is a noticeable bump on the big toe. Other symptoms may include:  Pain.  Swelling around the big toe.  Redness and inflammation.  Thick or hardened skin on the big toe or between the toes.  Stiffness or loss of motion in the big toe.  Trouble with walking. How is this diagnosed? A bunion may be diagnosed based on your symptoms, medical history, and activities. You may have tests, such as:  X-rays. These allow your health care provider to check the position of the bones in your foot and look for damage to your joint. They also help your health care provider determine the severity of your  bunion and the best way to treat it.  Joint aspiration. In this test, a sample of fluid is removed from the toe joint. This test may be done if you are in a lot of pain. It helps rule out diseases that cause painful swelling of the joints, such as arthritis. How is this treated? Treatment depends on the severity of your symptoms. The goal of treatment is to relieve symptoms and prevent the bunion from getting worse. Your health care provider may recommend:  Wearing shoes that have a wide toe box.  Using bunion pads to cushion the affected area.  Taping your toes together to keep them in a normal position.  Placing a device inside your shoe (orthotics) to help reduce pressure on your toe joint.  Taking medicine to ease pain, inflammation, and swelling.  Applying heat or ice to the affected area.  Doing stretching exercises.  Surgery to remove scar tissue and move the toes back into their normal position. This treatment is rare. Follow these instructions at home: Managing pain, stiffness, and swelling   If directed, put ice on the painful area: ? Put ice in a plastic bag. ? Place a towel between your skin and the bag. ? Leave the ice on for 20 minutes, 2-3 times a day. Activity   If directed, apply heat to the affected area before you exercise. Use the heat source that your health care provider recommends,  such as a moist heat pack or a heating pad. ? Place a towel between your skin and the heat source. ? Leave the heat on for 20-30 minutes. ? Remove the heat if your skin turns bright red. This is especially important if you are unable to feel pain, heat, or cold. You may have a greater risk of getting burned.  Do exercises as told by your health care provider. General instructions  Support your toe joint with proper footwear, shoe padding, or taping as told by your health care provider.  Take over-the-counter and prescription medicines only as told by your health care  provider.  Keep all follow-up visits as told by your health care provider. This is important. Contact a health care provider if your symptoms:  Get worse.  Do not improve in 2 weeks. Get help right away if you have:  Severe pain and trouble with walking. Summary  A bunion is a bump on the base of the big toe that forms when the bones of the big toe joint move out of position.  Bunions can make walking painful.  Treatment depends on the severity of your symptoms.  Support your toe joint with proper footwear, shoe padding, or taping as told by your health care provider. This information is not intended to replace advice given to you by your health care provider. Make sure you discuss any questions you have with your health care provider. Document Revised: 02/01/2018 Document Reviewed: 12/08/2017 Elsevier Patient Education  [place].  Fungal Nail Infection A fungal nail infection is a common infection of the toenails or fingernails. This condition affects toenails more often than fingernails. It often affects the great, or big, toes. More than one nail may be infected. The condition can be passed from person to person (is contagious). What are the causes? This condition is caused by a fungus. Several types of fungi can cause the infection. These fungi are common in moist and warm areas. If your hands or feet come into contact with the fungus, it may get into a crack in your fingernail or toenail and cause the infection. What increases the risk? The following factors may make you more likely to develop this condition:  Being male.  Being of older age.  Living with someone who has the fungus.  Walking barefoot in areas where the fungus thrives, such as showers or locker rooms.  Wearing shoes and socks that cause your feet to sweat.  Having a nail injury or a recent nail surgery.  Having certain medical conditions, such as: ? Athlete's  foot. ? Diabetes. ? Psoriasis. ? Poor circulation. ? A weak body defense system (immune system). What are the signs or symptoms? Symptoms of this condition include:  A pale spot on the nail.  Thickening of the nail.  A nail that becomes yellow or brown.  A brittle or ragged nail edge.  A crumbling nail.  A nail that has lifted away from the nail bed. How is this diagnosed? This condition is diagnosed with a physical exam. Your health care provider may take a scraping or clipping from your nail to test for the fungus. How is this treated? Treatment is not needed for mild infections. If you have significant nail changes, treatment may include:  Antifungal medicines taken by mouth (orally). You may need to take the medicine for several weeks or several months, and you may not see the results for a long time. These medicines can cause side effects. Ask your  health care provider what problems to watch for.  Antifungal nail polish or nail cream. These may be used along with oral antifungal medicines.  Laser treatment of the nail.  Surgery to remove the nail. This may be needed for the most severe infections. It can take a long time, usually up to a year, for the infection to go away. The infection may also come back. Follow these instructions at home: Medicines  Take or apply over-the-counter and prescription medicines only as told by your health care provider.  Ask your health care provider about using over-the-counter mentholated ointment on your nails. Nail care  Trim your nails often.  Wash and dry your hands and feet every day.  Keep your feet dry: ? Wear absorbent socks, and change your socks frequently. ? Wear shoes that allow air to circulate, such as sandals or canvas tennis shoes. Throw out old shoes.  Do not use artificial nails.  If you go to a nail salon, make sure you choose one that uses clean instruments.  Use antifungal foot powder on your feet and in  your shoes. General instructions  Do not share personal items, such as towels or nail clippers.  Do not walk barefoot in shower rooms or locker rooms.  Wear rubber gloves if you are working with your hands in wet areas.  Keep all follow-up visits as told by your health care provider. This is important. Contact a health care provider if: Your infection is not getting better or it is getting worse after several months. Summary  A fungal nail infection is a common infection of the toenails or fingernails.  Treatment is not needed for mild infections. If you have significant nail changes, treatment may include taking medicine orally and applying medicine to your nails.  It can take a long time, usually up to a year, for the infection to go away. The infection may also come back.  Take or apply over-the-counter and prescription medicines only as told by your health care provider.  Follow instructions for taking care of your nails to help prevent infection from coming back or spreading. This information is not intended to replace advice given to you by your health care provider. Make sure you discuss any questions you have with your health care provider. Document Revised: 11/18/2018 Document Reviewed: 01/01/2018 Elsevier Patient Education  [place].

## 2020-07-24 ENCOUNTER — Other Ambulatory Visit: Payer: Self-pay | Admitting: Podiatry

## 2020-07-24 DIAGNOSIS — M21612 Bunion of left foot: Secondary | ICD-10-CM

## 2020-07-24 DIAGNOSIS — M21611 Bunion of right foot: Secondary | ICD-10-CM

## 2020-09-11 DIAGNOSIS — N189 Chronic kidney disease, unspecified: Secondary | ICD-10-CM | POA: Diagnosis not present

## 2020-09-11 DIAGNOSIS — N390 Urinary tract infection, site not specified: Secondary | ICD-10-CM | POA: Diagnosis not present

## 2020-09-11 DIAGNOSIS — Z940 Kidney transplant status: Secondary | ICD-10-CM | POA: Diagnosis not present

## 2020-09-11 DIAGNOSIS — N2581 Secondary hyperparathyroidism of renal origin: Secondary | ICD-10-CM | POA: Diagnosis not present

## 2020-09-11 DIAGNOSIS — N185 Chronic kidney disease, stage 5: Secondary | ICD-10-CM | POA: Diagnosis not present

## 2020-09-11 DIAGNOSIS — D631 Anemia in chronic kidney disease: Secondary | ICD-10-CM | POA: Diagnosis not present

## 2020-09-11 DIAGNOSIS — I120 Hypertensive chronic kidney disease with stage 5 chronic kidney disease or end stage renal disease: Secondary | ICD-10-CM | POA: Diagnosis not present

## 2020-09-15 ENCOUNTER — Encounter (HOSPITAL_COMMUNITY): Payer: Self-pay | Admitting: Urgent Care

## 2020-09-15 ENCOUNTER — Ambulatory Visit (INDEPENDENT_AMBULATORY_CARE_PROVIDER_SITE_OTHER): Payer: Medicare Other

## 2020-09-15 ENCOUNTER — Inpatient Hospital Stay (HOSPITAL_COMMUNITY)
Admission: EM | Admit: 2020-09-15 | Discharge: 2020-09-17 | DRG: 193 | Discharge status: Against Medical Advice | Payer: Medicare Other | Attending: Internal Medicine | Admitting: Internal Medicine

## 2020-09-15 ENCOUNTER — Other Ambulatory Visit: Payer: Self-pay

## 2020-09-15 ENCOUNTER — Ambulatory Visit (HOSPITAL_COMMUNITY)
Admission: EM | Admit: 2020-09-15 | Discharge: 2020-09-15 | Disposition: A | Discharge status: Home / Self Care | Payer: Medicare Other | Attending: Urgent Care | Admitting: Urgent Care

## 2020-09-15 DIAGNOSIS — N186 End stage renal disease: Secondary | ICD-10-CM | POA: Diagnosis not present

## 2020-09-15 DIAGNOSIS — D84821 Immunodeficiency due to drugs: Secondary | ICD-10-CM | POA: Diagnosis not present

## 2020-09-15 DIAGNOSIS — D631 Anemia in chronic kidney disease: Secondary | ICD-10-CM | POA: Diagnosis present

## 2020-09-15 DIAGNOSIS — I361 Nonrheumatic tricuspid (valve) insufficiency: Secondary | ICD-10-CM | POA: Diagnosis not present

## 2020-09-15 DIAGNOSIS — R0602 Shortness of breath: Secondary | ICD-10-CM | POA: Diagnosis not present

## 2020-09-15 DIAGNOSIS — N179 Acute kidney failure, unspecified: Secondary | ICD-10-CM | POA: Diagnosis present

## 2020-09-15 DIAGNOSIS — Z794 Long term (current) use of insulin: Secondary | ICD-10-CM | POA: Diagnosis not present

## 2020-09-15 DIAGNOSIS — N2581 Secondary hyperparathyroidism of renal origin: Secondary | ICD-10-CM | POA: Diagnosis present

## 2020-09-15 DIAGNOSIS — R9431 Abnormal electrocardiogram [ECG] [EKG]: Secondary | ICD-10-CM | POA: Diagnosis not present

## 2020-09-15 DIAGNOSIS — E209 Hypoparathyroidism, unspecified: Secondary | ICD-10-CM | POA: Diagnosis not present

## 2020-09-15 DIAGNOSIS — N184 Chronic kidney disease, stage 4 (severe): Secondary | ICD-10-CM | POA: Diagnosis not present

## 2020-09-15 DIAGNOSIS — Z5329 Procedure and treatment not carried out because of patient's decision for other reasons: Secondary | ICD-10-CM | POA: Diagnosis present

## 2020-09-15 DIAGNOSIS — E1122 Type 2 diabetes mellitus with diabetic chronic kidney disease: Secondary | ICD-10-CM | POA: Diagnosis present

## 2020-09-15 DIAGNOSIS — E785 Hyperlipidemia, unspecified: Secondary | ICD-10-CM | POA: Diagnosis not present

## 2020-09-15 DIAGNOSIS — R058 Other specified cough: Secondary | ICD-10-CM | POA: Diagnosis not present

## 2020-09-15 DIAGNOSIS — J90 Pleural effusion, not elsewhere classified: Secondary | ICD-10-CM

## 2020-09-15 DIAGNOSIS — Z940 Kidney transplant status: Secondary | ICD-10-CM

## 2020-09-15 DIAGNOSIS — I10 Essential (primary) hypertension: Secondary | ICD-10-CM | POA: Diagnosis not present

## 2020-09-15 DIAGNOSIS — Z79899 Other long term (current) drug therapy: Secondary | ICD-10-CM

## 2020-09-15 DIAGNOSIS — Z833 Family history of diabetes mellitus: Secondary | ICD-10-CM

## 2020-09-15 DIAGNOSIS — N189 Chronic kidney disease, unspecified: Secondary | ICD-10-CM

## 2020-09-15 DIAGNOSIS — Z20822 Contact with and (suspected) exposure to covid-19: Secondary | ICD-10-CM | POA: Diagnosis not present

## 2020-09-15 DIAGNOSIS — M109 Gout, unspecified: Secondary | ICD-10-CM | POA: Diagnosis not present

## 2020-09-15 DIAGNOSIS — I351 Nonrheumatic aortic (valve) insufficiency: Secondary | ICD-10-CM | POA: Diagnosis not present

## 2020-09-15 DIAGNOSIS — I5033 Acute on chronic diastolic (congestive) heart failure: Secondary | ICD-10-CM | POA: Diagnosis present

## 2020-09-15 DIAGNOSIS — I132 Hypertensive heart and chronic kidney disease with heart failure and with stage 5 chronic kidney disease, or end stage renal disease: Secondary | ICD-10-CM | POA: Diagnosis not present

## 2020-09-15 DIAGNOSIS — D72819 Decreased white blood cell count, unspecified: Secondary | ICD-10-CM | POA: Diagnosis not present

## 2020-09-15 DIAGNOSIS — J189 Pneumonia, unspecified organism: Secondary | ICD-10-CM

## 2020-09-15 DIAGNOSIS — E872 Acidosis: Secondary | ICD-10-CM | POA: Diagnosis present

## 2020-09-15 DIAGNOSIS — D649 Anemia, unspecified: Secondary | ICD-10-CM

## 2020-09-15 DIAGNOSIS — R059 Cough, unspecified: Secondary | ICD-10-CM | POA: Diagnosis not present

## 2020-09-15 DIAGNOSIS — I340 Nonrheumatic mitral (valve) insufficiency: Secondary | ICD-10-CM | POA: Diagnosis not present

## 2020-09-15 DIAGNOSIS — R079 Chest pain, unspecified: Secondary | ICD-10-CM | POA: Diagnosis not present

## 2020-09-15 DIAGNOSIS — E8770 Fluid overload, unspecified: Secondary | ICD-10-CM | POA: Diagnosis present

## 2020-09-15 DIAGNOSIS — I129 Hypertensive chronic kidney disease with stage 1 through stage 4 chronic kidney disease, or unspecified chronic kidney disease: Secondary | ICD-10-CM | POA: Diagnosis not present

## 2020-09-15 DIAGNOSIS — I120 Hypertensive chronic kidney disease with stage 5 chronic kidney disease or end stage renal disease: Secondary | ICD-10-CM | POA: Diagnosis not present

## 2020-09-15 DIAGNOSIS — Z862 Personal history of diseases of the blood and blood-forming organs and certain disorders involving the immune mechanism: Secondary | ICD-10-CM | POA: Diagnosis present

## 2020-09-15 LAB — CBC
HCT: 24.9 % — ABNORMAL LOW (ref 39.0–52.0)
Hemoglobin: 7.2 g/dL — ABNORMAL LOW (ref 13.0–17.0)
MCH: 23.1 pg — ABNORMAL LOW (ref 26.0–34.0)
MCHC: 28.9 g/dL — ABNORMAL LOW (ref 30.0–36.0)
MCV: 79.8 fL — ABNORMAL LOW (ref 80.0–100.0)
Platelets: 165 10*3/uL (ref 150–400)
RBC: 3.12 MIL/uL — ABNORMAL LOW (ref 4.22–5.81)
RDW: 15.2 % (ref 11.5–15.5)
WBC: 3 10*3/uL — ABNORMAL LOW (ref 4.0–10.5)
nRBC: 0 % (ref 0.0–0.2)

## 2020-09-15 LAB — BASIC METABOLIC PANEL
Anion gap: 13 (ref 5–15)
BUN: 89 mg/dL — ABNORMAL HIGH (ref 8–23)
CO2: 21 mmol/L — ABNORMAL LOW (ref 22–32)
Calcium: 8.8 mg/dL — ABNORMAL LOW (ref 8.9–10.3)
Chloride: 105 mmol/L (ref 98–111)
Creatinine, Ser: 10.33 mg/dL — ABNORMAL HIGH (ref 0.61–1.24)
GFR, Estimated: 5 mL/min — ABNORMAL LOW (ref >60)
Glucose, Bld: 182 mg/dL — ABNORMAL HIGH (ref 70–99)
Potassium: 4.3 mmol/L (ref 3.5–5.1)
Sodium: 139 mmol/L (ref 135–145)

## 2020-09-15 LAB — BRAIN NATRIURETIC PEPTIDE: B Natriuretic Peptide: 1802.9 pg/mL — ABNORMAL HIGH (ref 0.0–100.0)

## 2020-09-15 MED ORDER — ALBUTEROL SULFATE HFA 108 (90 BASE) MCG/ACT IN AERS: 2.0000 | INHALATION_SPRAY | RESPIRATORY_TRACT | Status: DC | PRN

## 2020-09-15 NOTE — ED Triage Notes (Signed)
Pt presents to ED POV. Pt c/o cough and orthopnea x3w. Pt sent here by [facility]. Reports that in the middle of the night he is woken up by SOB and coughing. Pt reports that he was recently told that his kidney transplant is failing.

## 2020-09-15 NOTE — ED Provider Notes (Signed)
[facility]   MRN: [id number] DOB: Aug 31, 1950  Subjective:   [name] is a 70 y.o. male presenting for 3 week history of persistent productive cough with intermittent chest pain and shortness of breath.  Patient has severely uncontrolled type 2 diabetes and end-stage renal disease.  He is not currently on dialysis but the plan is to get him back on dialysis soon.  Patient is COVID vaccinated.  Does not want a COVID test now.  No current facility-administered medications for this encounter.  Current Outpatient Medications:  .  ACCU-CHEK SOFTCLIX LANCETS lancets, 1 each by Other route 2 (two) times daily. Use to check blood sugars twice a day, Disp: 100 each, Rfl: 3 .  acetaminophen (TYLENOL) 500 MG tablet, Take 1,000 mg by mouth., Disp: , Rfl:  .  amLODipine (NORVASC) 5 MG tablet, Take 5 mg by mouth daily., Disp: , Rfl:  .  atorvastatin (LIPITOR) 10 MG tablet, Take 10 mg by mouth daily., Disp: , Rfl: 7 .  Blood Glucose Monitoring Suppl (ACCU-CHEK AVIVA PLUS) w/Device KIT, Use as directed to check blood sugars, Disp: 1 kit, Rfl: 0 .  calcitRIOL (ROCALTROL) 0.5 MCG capsule, Take 0.5 mcg by mouth daily., Disp: , Rfl: 11 .  carvedilol (COREG) 25 MG tablet, Take 37.5 mg by mouth 2 (two) times daily with a meal. , Disp: , Rfl:  .  ciclopirox (PENLAC) 8 % solution, Apply topically at bedtime. Apply over nail and surrounding skin. Apply daily over previous coat. After seven (7) days, may remove with alcohol and continue cycle., Disp: 6.6 mL, Rfl: 2 .  glucose blood test strip, Use TID, Disp: 100 each, Rfl: 12 .  HUMALOG KWIKPEN 100 UNIT/ML KwikPen, INJECT 15 UNITS SUBCUTANEOUSLY BEFORE MEAL(S), Disp: 3 mL, Rfl: 0 .  Insulin Degludec (TRESIBA FLEXTOUCH) 200 UNIT/ML SOPN, Inject 32 Units into the skin daily., Disp: 9 pen, Rfl: 3 .  Insulin Pen Needle (NOVOFINE) 32G X 6 MM MISC, 1 Act by Does not apply route 4 (four) times daily., Disp: 100 each, Rfl: 11 .  latanoprost  (XALATAN) 0.005 % ophthalmic solution, , Disp: , Rfl:  .  losartan (COZAAR) 50 MG tablet, Take 50 mg by mouth daily., Disp: , Rfl:  .  magnesium oxide (MAG-OX) 400 MG tablet, Take 400 mg by mouth daily., Disp: , Rfl:  .  mycophenolate (MYFORTIC) 180 MG EC tablet, Take 540 mg by mouth 2 (two) times daily., Disp: , Rfl:  .  tacrolimus (PROGRAF) 1 MG capsule, Take 6-7 mg by mouth 2 (two) times daily. Take 25m in the morning and 638min the evening, Disp: , Rfl:  .  Vitamin D, Ergocalciferol, (DRISDOL) 50000 units CAPS capsule, Take by mouth., Disp: , Rfl:    Allergies  Allergen Reactions  . Hydrocodone Itching    Past Medical History:  Diagnosis Date  . Anemia   . Anemia   . Benign colon polyp 2012  . Clot    STATED TOLD IN APRIL CLOT TO LEFT SHOULDER. DOCTOR MADE AWARE  . Gout   . Headache(784.0)   . 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 HTN (hypertension)   . Hyperlipidemia   . PONV (postoperative nausea and vomiting)   . Renal failure      Past Surgical History:  Procedure Laterality Date  . AV FISTULA PLACEMENT  01/19/2012   Procedure: ARTERIOVENOUS (AV) FISTULA CREATION;  Surgeon: BrConrad BurlingtonMD;  [facility]  Service: Vascular;  Laterality: Right;  Right Brachio-cephalic arteriovenous fistula  . AV FISTULA PLACEMENT, RADIOCEPHALIC  59/93/57   Right arm  . INSERTION OF DIALYSIS CATHETER  01/19/2012   Procedure: INSERTION OF DIALYSIS CATHETER;  Surgeon: Conrad Creola, MD;  Location: [facility];  Service: Vascular;  Laterality: N/A;  right internal jugular vein  . 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    Family History  Problem Relation Age of Onset  . Diabetes Father   . Colon cancer Neg Hx   . Esophageal cancer Neg Hx   . Stomach cancer Neg Hx   . Rectal cancer Neg Hx     Social History   Tobacco Use  . Smoking status: Never Smoker  . Smokeless tobacco: Never Used  Substance Use Topics  . Alcohol use: No    Comment: 1 drink a month or non   . Drug use: No    ROS   Objective:   Vitals: BP (!) 168/81   Pulse 69   Temp 98 F (36.7 C)   Resp 19   SpO2 100%   Physical Exam Constitutional:      General: He is not in acute distress.    Appearance: Normal appearance. He is well-developed. He is not ill-appearing, toxic-appearing or diaphoretic.  HENT:     Head: Normocephalic and atraumatic.     Right Ear: External ear normal.     Left Ear: External ear normal.     Nose: Nose normal.     Mouth/Throat:     Mouth: Mucous membranes are moist.     Pharynx: Oropharynx is clear.  Eyes:     General: No scleral icterus.    Extraocular Movements: Extraocular movements intact.     Pupils: Pupils are equal, round, and reactive to light.  Cardiovascular:     Rate and Rhythm: Normal rate and regular rhythm.     Heart sounds: Normal heart sounds. No murmur heard. No friction rub. No gallop.   Pulmonary:     Effort: Pulmonary effort is normal. No respiratory distress.     Breath sounds: No stridor. Examination of the right-middle field reveals decreased breath sounds. Examination of the right-lower field reveals decreased breath sounds. Examination of the left-lower field reveals decreased breath sounds. Decreased breath sounds present. No wheezing, rhonchi or rales.  Neurological:     Mental Status: He is alert and oriented to person, place, and time.  Psychiatric:        Mood and Affect: Mood normal.        Behavior: Behavior normal.        Thought Content: Thought content normal.     DG Chest 2 View  Result Date: 09/15/2020 CLINICAL DATA:  Productive cough for 3 weeks. EXAM: CHEST - 2 VIEW COMPARISON:  Two-view chest x-ray 05/07/2017 FINDINGS: Heart size is upper limits of normal. Perihilar fullness is present bilaterally. Patchy airspace disease is present at the left greater than right base. Bilateral effusions are present. Right suprahilar airspace opacities are noted as well. IMPRESSION: 1. Patchy bibasilar airspace  disease, left greater than right, concerning for pneumonia. 2. Right upper lobe airspace disease is also concerning for pneumonia. 3. Bilateral pleural effusions. Electronically Signed   By: San Morelle M.D.   On: 09/15/2020 10:54    Assessment and Plan :   PDMP not reviewed this encounter.  1. Pleural effusion   2. Community acquired pneumonia, unspecified laterality     Patient has bilateral pleural effusions, bilateral basilar pneumonia  and has significant risk factors of severely uncontrolled diabetes and end-stage renal disease.  I discussed this with patient and emphasized need for evaluation and further intervention in the [facility] emergency room/hospital.  Patient contracts for safety and will present to the emergency room now.    Jaynee Eagles, [place] 09/15/20 1112

## 2020-09-15 NOTE — Discharge Instructions (Addendum)
Please report to the [facility] now as you have fluid in your lungs (pleural effusion), pneumonia of both your lungs and need treatment that we cannot safely and adequately provide in the urgent care setting given your severe kidney disease.

## 2020-09-15 NOTE — ED Triage Notes (Signed)
Pt in with c/o productive cough that has been going on for 3 weeks now.   Pt has taken alka seltzer and nyquil with some relief

## 2020-09-16 ENCOUNTER — Inpatient Hospital Stay (HOSPITAL_COMMUNITY): Payer: Medicare Other

## 2020-09-16 DIAGNOSIS — Z20822 Contact with and (suspected) exposure to covid-19: Secondary | ICD-10-CM | POA: Diagnosis present

## 2020-09-16 DIAGNOSIS — I351 Nonrheumatic aortic (valve) insufficiency: Secondary | ICD-10-CM | POA: Diagnosis not present

## 2020-09-16 DIAGNOSIS — I340 Nonrheumatic mitral (valve) insufficiency: Secondary | ICD-10-CM

## 2020-09-16 DIAGNOSIS — D631 Anemia in chronic kidney disease: Secondary | ICD-10-CM

## 2020-09-16 DIAGNOSIS — I361 Nonrheumatic tricuspid (valve) insufficiency: Secondary | ICD-10-CM | POA: Diagnosis not present

## 2020-09-16 DIAGNOSIS — J189 Pneumonia, unspecified organism: Secondary | ICD-10-CM | POA: Diagnosis present

## 2020-09-16 DIAGNOSIS — N179 Acute kidney failure, unspecified: Secondary | ICD-10-CM | POA: Diagnosis present

## 2020-09-16 DIAGNOSIS — D72819 Decreased white blood cell count, unspecified: Secondary | ICD-10-CM | POA: Diagnosis present

## 2020-09-16 DIAGNOSIS — Z79899 Other long term (current) drug therapy: Secondary | ICD-10-CM | POA: Diagnosis not present

## 2020-09-16 DIAGNOSIS — I10 Essential (primary) hypertension: Secondary | ICD-10-CM

## 2020-09-16 DIAGNOSIS — E785 Hyperlipidemia, unspecified: Secondary | ICD-10-CM | POA: Diagnosis present

## 2020-09-16 DIAGNOSIS — N189 Chronic kidney disease, unspecified: Secondary | ICD-10-CM

## 2020-09-16 DIAGNOSIS — N184 Chronic kidney disease, stage 4 (severe): Secondary | ICD-10-CM | POA: Diagnosis not present

## 2020-09-16 DIAGNOSIS — I5033 Acute on chronic diastolic (congestive) heart failure: Secondary | ICD-10-CM | POA: Diagnosis present

## 2020-09-16 DIAGNOSIS — E1122 Type 2 diabetes mellitus with diabetic chronic kidney disease: Secondary | ICD-10-CM | POA: Diagnosis present

## 2020-09-16 DIAGNOSIS — M109 Gout, unspecified: Secondary | ICD-10-CM | POA: Diagnosis present

## 2020-09-16 DIAGNOSIS — Z940 Kidney transplant status: Secondary | ICD-10-CM

## 2020-09-16 DIAGNOSIS — N2581 Secondary hyperparathyroidism of renal origin: Secondary | ICD-10-CM | POA: Diagnosis present

## 2020-09-16 DIAGNOSIS — R079 Chest pain, unspecified: Secondary | ICD-10-CM | POA: Diagnosis not present

## 2020-09-16 DIAGNOSIS — E8770 Fluid overload, unspecified: Secondary | ICD-10-CM | POA: Diagnosis not present

## 2020-09-16 DIAGNOSIS — D84821 Immunodeficiency due to drugs: Secondary | ICD-10-CM

## 2020-09-16 DIAGNOSIS — I132 Hypertensive heart and chronic kidney disease with heart failure and with stage 5 chronic kidney disease, or end stage renal disease: Secondary | ICD-10-CM | POA: Diagnosis present

## 2020-09-16 DIAGNOSIS — E209 Hypoparathyroidism, unspecified: Secondary | ICD-10-CM | POA: Diagnosis present

## 2020-09-16 DIAGNOSIS — Z833 Family history of diabetes mellitus: Secondary | ICD-10-CM | POA: Diagnosis not present

## 2020-09-16 DIAGNOSIS — Z794 Long term (current) use of insulin: Secondary | ICD-10-CM | POA: Diagnosis not present

## 2020-09-16 DIAGNOSIS — N186 End stage renal disease: Secondary | ICD-10-CM | POA: Diagnosis present

## 2020-09-16 DIAGNOSIS — E872 Acidosis: Secondary | ICD-10-CM | POA: Diagnosis present

## 2020-09-16 DIAGNOSIS — Z5329 Procedure and treatment not carried out because of patient's decision for other reasons: Secondary | ICD-10-CM | POA: Diagnosis present

## 2020-09-16 LAB — HEMOGLOBIN AND HEMATOCRIT, BLOOD
HCT: 25.6 % — ABNORMAL LOW (ref 39.0–52.0)
Hemoglobin: 7.3 g/dL — ABNORMAL LOW (ref 13.0–17.0)

## 2020-09-16 LAB — URINALYSIS, ROUTINE W REFLEX MICROSCOPIC
Bilirubin Urine: NEGATIVE
Glucose, UA: NEGATIVE mg/dL
Ketones, ur: NEGATIVE mg/dL
Nitrite: NEGATIVE
Protein, ur: 100 mg/dL — AB
Specific Gravity, Urine: 1.011 (ref 1.005–1.030)
WBC, UA: >50 WBC/hpf — ABNORMAL HIGH (ref 0–5)
pH: 6 (ref 5.0–8.0)

## 2020-09-16 LAB — TYPE AND SCREEN
ABO/RH(D): O POS
Antibody Screen: NEGATIVE

## 2020-09-16 LAB — ECHOCARDIOGRAM COMPLETE
AR max vel: 1.88 cm2
AV Area VTI: 1.87 cm2
AV Area mean vel: 1.9 cm2
AV Mean grad: 10 mmHg
AV Peak grad: 20.3 mmHg
Ao pk vel: 2.25 m/s
Area-P 1/2: 3.42 cm2
S' Lateral: 3.5 cm
Single Plane A4C EF: 68.6 %
Weight: 3315.72 oz

## 2020-09-16 LAB — PROCALCITONIN: Procalcitonin: 0.59 ng/mL

## 2020-09-16 LAB — HEMOGLOBIN A1C
Hgb A1c MFr Bld: 5.4 % (ref 4.8–5.6)
Mean Plasma Glucose: 108.28 mg/dL

## 2020-09-16 LAB — TROPONIN I (HIGH SENSITIVITY): Troponin I (High Sensitivity): 22 ng/L — ABNORMAL HIGH (ref <18)

## 2020-09-16 LAB — CBG MONITORING, ED
Glucose-Capillary: 110 mg/dL — ABNORMAL HIGH (ref 70–99)
Glucose-Capillary: 105 mg/dL — ABNORMAL HIGH (ref 70–99)

## 2020-09-16 LAB — STREP PNEUMONIAE URINARY ANTIGEN: Strep Pneumo Urinary Antigen: NEGATIVE

## 2020-09-16 LAB — ABO/RH: ABO/RH(D): O POS

## 2020-09-16 LAB — SARS CORONAVIRUS 2 BY RT PCR (HOSPITAL ORDER, PERFORMED IN ~~LOC~~ HOSPITAL LAB): SARS Coronavirus 2: NEGATIVE

## 2020-09-16 LAB — HIV ANTIBODY (ROUTINE TESTING W REFLEX): HIV Screen 4th Generation wRfx: NONREACTIVE

## 2020-09-16 MED ORDER — ACETAMINOPHEN 325 MG PO TABS: 650.0000 mg | ORAL_TABLET | Freq: Four times a day (QID) | ORAL | Status: DC | PRN

## 2020-09-16 MED ORDER — SODIUM CHLORIDE 0.9 % IV SOLN
100.0000 mg | Freq: Two times a day (BID) | INTRAVENOUS | Status: DC
  Administered 2020-09-16 (×2): 100 mg via INTRAVENOUS
  Filled 2020-09-16 (×3): qty 100

## 2020-09-16 MED ORDER — SODIUM CHLORIDE 0.9 % IV SOLN: 2.0000 g | Freq: Once | INTRAVENOUS | Status: DC

## 2020-09-16 MED ORDER — VANCOMYCIN HCL 2000 MG/400ML IV SOLN
2000.0000 mg | Freq: Once | INTRAVENOUS | Status: DC
  Filled 2020-09-16: qty 400

## 2020-09-16 MED ORDER — CEFTRIAXONE SODIUM 2 G IJ SOLR
2.0000 g | INTRAMUSCULAR | Status: DC
  Administered 2020-09-16: 2 g via INTRAVENOUS
  Filled 2020-09-16: qty 20

## 2020-09-16 MED ORDER — FUROSEMIDE 10 MG/ML IJ SOLN
80.0000 mg | Freq: Two times a day (BID) | INTRAMUSCULAR | Status: DC
  Administered 2020-09-16 (×2): 80 mg via INTRAVENOUS
  Filled 2020-09-16 (×2): qty 8

## 2020-09-16 MED ORDER — SODIUM CHLORIDE 0.9 % IV BOLUS: 500.0000 mL | Freq: Once | INTRAVENOUS | Status: DC

## 2020-09-16 MED ORDER — RENA-VITE PO TABS
1.0000 | ORAL_TABLET | Freq: Every day | ORAL | Status: DC
  Administered 2020-09-16: 1 via ORAL
  Filled 2020-09-16: qty 1

## 2020-09-16 MED ORDER — ACETAMINOPHEN 650 MG RE SUPP: 650.0000 mg | Freq: Four times a day (QID) | RECTAL | Status: DC | PRN

## 2020-09-16 MED ORDER — GUAIFENESIN ER 600 MG PO TB12
600.0000 mg | ORAL_TABLET | Freq: Two times a day (BID) | ORAL | Status: DC
  Administered 2020-09-16 (×2): 600 mg via ORAL
  Filled 2020-09-16 (×2): qty 1

## 2020-09-16 MED ORDER — SODIUM CHLORIDE 0.9% FLUSH
3.0000 mL | Freq: Two times a day (BID) | INTRAVENOUS | Status: DC
  Administered 2020-09-16 (×2): 3 mL via INTRAVENOUS

## 2020-09-16 MED ORDER — INSULIN ASPART 100 UNIT/ML ~~LOC~~ SOLN: 0.0000 [IU] | Freq: Three times a day (TID) | SUBCUTANEOUS | Status: DC

## 2020-09-16 MED ORDER — CALCITRIOL 0.25 MCG PO CAPS
0.2500 ug | ORAL_CAPSULE | Freq: Every day | ORAL | Status: DC
  Administered 2020-09-16: 0.25 ug via ORAL
  Filled 2020-09-16 (×2): qty 1

## 2020-09-16 MED ORDER — MYCOPHENOLATE SODIUM 180 MG PO TBEC
180.0000 mg | DELAYED_RELEASE_TABLET | Freq: Two times a day (BID) | ORAL | Status: DC
  Administered 2020-09-16: 180 mg via ORAL
  Filled 2020-09-16 (×3): qty 1

## 2020-09-16 MED ORDER — TACROLIMUS 1 MG PO CAPS
5.0000 mg | ORAL_CAPSULE | Freq: Two times a day (BID) | ORAL | Status: DC
  Administered 2020-09-16 (×2): 5 mg via ORAL
  Filled 2020-09-16 (×3): qty 5

## 2020-09-16 MED ORDER — PREDNISONE 5 MG PO TABS
5.0000 mg | ORAL_TABLET | Freq: Every day | ORAL | Status: DC
  Filled 2020-09-16: qty 1

## 2020-09-16 NOTE — Consult Note (Signed)
Reason for Consult: Chronic kidney disease stage V, volume overload Referring Physician: Fuller Plan, MD [facility])  HPI:  70 year old White of [place] origin with past medical history significant for end-stage renal disease status post deceased donor kidney transplant 7 years ago (2015) with progressive allograft dysfunction now to chronic kidney disease stage V T for which I saw him last week and started the process for outpatient dialysis unit placement to initiate dialysis due to a variety of uremic symptoms.  He also has underlying hypertension, insulin dependent diabetes mellitus, dyslipidemia, secondary hyperparathyroidism, hyperuricemia/gout and anemia of chronic kidney disease.  He presented to the emergency room last night by way of the urgent care center with complaints of productive cough with intermittent shortness of breath that was limiting his ability to sleep at night.  He denies any fevers but reports some intermittent chills and some chest heaviness without hemoptysis.  He has had pedal edema that is unchanged in quantity as well as unintentional weight loss.  His chest x-ray shows findings concerning for pneumonia as well as bilateral pleural effusions.  Past Medical History:  Diagnosis Date  . Anemia   . Anemia   . Benign colon polyp 2012  . Clot    STATED TOLD IN [facility] CLOT TO LEFT SHOULDER. DOCTOR MADE AWARE  . Gout   . Headache(784.0)   . 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 HTN (hypertension)   . Hyperlipidemia   . PONV (postoperative nausea and vomiting)   . Renal failure     Past Surgical History:  Procedure Laterality Date  . AV FISTULA PLACEMENT  01/19/2012   Procedure: ARTERIOVENOUS (AV) FISTULA CREATION;  Surgeon: Conrad Copiague, MD;  Location: [place];  Service: Vascular;  Laterality: Right;  Right Brachio-cephalic arteriovenous fistula  . AV FISTULA PLACEMENT, RADIOCEPHALIC  29/92/42   Right arm  . INSERTION OF DIALYSIS CATHETER   01/19/2012   Procedure: INSERTION OF DIALYSIS CATHETER;  Surgeon: Conrad Jal, MD;  Location: [place];  Service: Vascular;  Laterality: N/A;  right internal jugular vein  . 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    Family History  Problem Relation Age of Onset  . Diabetes Father   . Colon cancer Neg Hx   . Esophageal cancer Neg Hx   . Stomach cancer Neg Hx   . Rectal cancer Neg Hx     Social History:  reports that he has never smoked. He has never used smokeless tobacco. He reports that he does not drink alcohol and does not use drugs.  Allergies:  Allergies  Allergen Reactions  . Hydrocodone Itching    Medications:  Scheduled: . furosemide  80 mg Intravenous BID  . guaiFENesin  600 mg Oral BID  . insulin aspart  0-15 Units Subcutaneous TID WC  . sodium chloride flush  3 mL Intravenous Q12H    BMP Latest Ref Rng & Units 09/15/2020 04/18/2020 04/03/2020  Glucose 70 - 99 mg/dL 182(H) 357(H) -  BUN 8 - 23 mg/dL 89(H) 59(H) -  Creatinine 0.61 - 1.24 mg/dL 10.33(H) 4.99(H) -  Sodium 135 - 145 mmol/L 139 136 135(A)  Potassium 3.5 - 5.1 mmol/L 4.3 5.1 5.0  Chloride 98 - 111 mmol/L 105 104 102  CO2 22 - 32 mmol/L 21(L) 22 18  Calcium 8.9 - 10.3 mg/dL 8.8(L) 8.9 9.0   CBC Latest Ref Rng & Units 09/16/2020 09/15/2020 04/18/2020  WBC 4.0 - 10.5 K/uL - 3.0(L) 7.0  Hemoglobin 13.0 - 17.0  g/dL 7.3(L) 7.2(L) 8.8(L)  Hematocrit 39.0 - 52.0 % 25.6(L) 24.9(L) 29.1(L)  Platelets 150 - 400 K/uL - 165 105(L)     DG Chest 2 View  Result Date: 09/15/2020 CLINICAL DATA:  Productive cough for 3 weeks. EXAM: CHEST - 2 VIEW COMPARISON:  Two-view chest x-ray 05/07/2017 FINDINGS: Heart size is upper limits of normal. Perihilar fullness is present bilaterally. Patchy airspace disease is present at the left greater than right base. Bilateral effusions are present. Right suprahilar airspace opacities are noted as well. IMPRESSION: 1. Patchy bibasilar airspace disease, left greater than right, concerning for  pneumonia. 2. Right upper lobe airspace disease is also concerning for pneumonia. 3. Bilateral pleural effusions. Electronically Signed   By: San Morelle M.D.   On: 09/15/2020 10:54    Review of Systems  Constitutional: Positive for activity change, appetite change, chills, fatigue and unexpected weight change. Negative for fever.  HENT: Negative for ear pain, nosebleeds, sinus pain, sore throat and trouble swallowing.   Eyes: Negative for pain, redness and visual disturbance.  Respiratory: Positive for cough, choking, chest tightness and shortness of breath. Negative for apnea and wheezing.   Cardiovascular: Positive for leg swelling. Negative for chest pain.  Gastrointestinal: Negative for abdominal distention, diarrhea, nausea and vomiting.  Endocrine: Positive for cold intolerance. Negative for heat intolerance.  Genitourinary: Negative for dysuria, frequency, hematuria and urgency.  Musculoskeletal: Negative for arthralgias, myalgias and neck stiffness.  Skin: Negative for rash.  Neurological: Positive for weakness. Negative for dizziness, light-headedness and numbness.   Blood pressure (!) 166/83, pulse 67, temperature 98.3 F (36.8 C), temperature source Oral, resp. rate (!) 24, weight 94 kg, SpO2 96 %. Physical Exam Vitals and nursing note reviewed.  Constitutional:      General: He is not in acute distress.    Appearance: Normal appearance. He is normal weight. He is not toxic-appearing.  HENT:     Head: Normocephalic and atraumatic.     Right Ear: External ear normal.     Left Ear: External ear normal.     Nose: Nose normal.     Mouth/Throat:     Mouth: Mucous membranes are dry.     Pharynx: Oropharynx is clear. No oropharyngeal exudate.  Eyes:     Extraocular Movements: Extraocular movements intact.     Conjunctiva/sclera: Conjunctivae normal.  Cardiovascular:     Rate and Rhythm: Normal rate and regular rhythm.     Pulses: Normal pulses.     Heart sounds:  Normal heart sounds.  Pulmonary:     Effort: Pulmonary effort is normal.     Breath sounds: Rales present. No wheezing or rhonchi.  Abdominal:     General: Abdomen is flat.     Palpations: Abdomen is soft.     Tenderness: There is no abdominal tenderness. There is no guarding.  Musculoskeletal:     Cervical back: Normal range of motion and neck supple.     Right lower leg: Edema present.     Left lower leg: Edema present.     Comments: 2-3+ lower extremity edema bilaterally.  Right brachiocephalic fistula-hyper pulsatile.  Skin:    General: Skin is warm and dry.     Coloration: Skin is not pale.  Neurological:     General: No focal deficit present.     Mental Status: He is alert and oriented to person, place, and time. Mental status is at baseline.  Psychiatric:        Mood and Affect: Mood  normal.     Assessment/Plan: 1.  Community-acquired pneumonia: Started on broad-spectrum antimicrobial therapy with Rocephin and doxycycline.  He does not appear to be septic and I will begin the process of de-escalating immunosuppressive therapy by cutting back on his Myfortic and Prograf doses. 2.  New end-stage renal disease: With progressive chronic kidney disease following allograft dysfunction.  We will start him on intermittent hemodialysis beginning tomorrow-the process was started last week for outpatient dialysis unit placement and this will be followed up on.  He has a functional right brachiocephalic fistula in place that is pulsatile and may have cephalic arch stenosis based on exam-this should be assessed with a fistulogram as an outpatient. 3.  Hypertension: Resume oral antihypertensive therapy and monitor with ultrafiltration/dialysis, agree with diuretic challenge. 4.  Anemia of chronic kidney disease: Will start him on ESA and follow-up on iron studies. 5.  Secondary hyperparathyroidism: Calcium level within acceptable range, will check phosphorus level add-on to prior labs.   Continue calcitriol for PTH control. 6.  Nutrition: Follow-up on albumin levels and resume renal diet/renal multivitamin.  Martine Bleecker K. 09/16/2020, 1:35 PM

## 2020-09-16 NOTE — ED Notes (Signed)
Lunch Tray Ordered @ 1105. 

## 2020-09-16 NOTE — Progress Notes (Signed)
  Echocardiogram 2D Echocardiogram has been performed.  [name] F 09/16/2020, 2:05 PM

## 2020-09-16 NOTE — ED Provider Notes (Signed)
[facility] EMERGENCY DEPARTMENT Provider Note   CSN: [id number] Arrival date & time: 09/15/20  2045     History Chief Complaint  Patient presents with  . Cough    [name] is a 70 y.o. male.  The history is provided by the patient. No language interpreter was used.  Cough Cough characteristics:  Productive Sputum characteristics:  Nondescript Severity:  Moderate Onset quality:  Gradual Duration:  3 weeks Timing:  Constant Progression:  Worsening Chronicity:  New Context: sick contacts   Relieved by:  Nothing Worsened by:  Nothing Ineffective treatments:  None tried Associated symptoms: shortness of breath   Associated symptoms: no fever   Pt reports e has been told that his kidney is failing.  Pt had a transplant in 2015.  Pt sees Dr. Posey Pronto at [facility]      Past Medical History:  Diagnosis Date  . Anemia   . Anemia   . Benign colon polyp 2012  . Clot    STATED TOLD IN [facility] CLOT TO LEFT SHOULDER. DOCTOR MADE AWARE  . Gout   . Headache(784.0)   . 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 HTN (hypertension)   . Hyperlipidemia   . PONV (postoperative nausea and vomiting)   . Renal failure     Patient Active Problem List   Diagnosis Date Noted  . Dyslipidemia, goal LDL below 100 04/18/2020  . Thrombocytopenia ([place]) 04/18/2020  . Occult blood in stools 04/18/2020  . Sensation of chest pressure 04/18/2020  . Epigastric pain 04/18/2020  . Acquired ureteral stricture 11/11/2017  . Other constipation 06/15/2017  . Type 2 diabetes mellitus with complication, without long-term current use of insulin ([place]) 05/07/2017  . Anemia in chronic kidney disease 02/06/2014  . End stage renal disease ([place]) 01/16/2012  . ED (erectile dysfunction) 07/30/2011  . Routine general medical examination at a health care facility 07/30/2011  . Transplant recipient 01/30/2011  . Hypertension 10/16/2010    Past Surgical  History:  Procedure Laterality Date  . AV FISTULA PLACEMENT  01/19/2012   Procedure: ARTERIOVENOUS (AV) FISTULA CREATION;  Surgeon: Conrad Mobeetie, MD;  Location: [place];  Service: Vascular;  Laterality: Right;  Right Brachio-cephalic arteriovenous fistula  . AV FISTULA PLACEMENT, RADIOCEPHALIC  42/87/68   Right arm  . INSERTION OF DIALYSIS CATHETER  01/19/2012   Procedure: INSERTION OF DIALYSIS CATHETER;  Surgeon: Conrad Matteson, MD;  Location: [place];  Service: Vascular;  Laterality: N/A;  right internal jugular vein  . 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       Family History  Problem Relation Age of Onset  . Diabetes Father   . Colon cancer Neg Hx   . Esophageal cancer Neg Hx   . Stomach cancer Neg Hx   . Rectal cancer Neg Hx     Social History   Tobacco Use  . Smoking status: Never Smoker  . Smokeless tobacco: Never Used  Substance Use Topics  . Alcohol use: No    Comment: 1 drink a month or non  . Drug use: No    Home Medications Prior to Admission medications   Medication Sig Start Date End Date Taking? Authorizing Provider  ACCU-CHEK SOFTCLIX LANCETS lancets 1 each by Other route 2 (two) times daily. Use to check blood sugars twice a day 03/04/17   Janith Lima, MD  acetaminophen (TYLENOL) 500 MG tablet Take 1,000 mg by mouth.    [provider]  amLODipine (NORVASC) 5 MG tablet Take 5 mg by mouth daily.    [provider]  atorvastatin (LIPITOR) 10 MG tablet Take 10 mg by mouth daily. 01/26/17   [provider]  Blood Glucose Monitoring Suppl (ACCU-CHEK AVIVA PLUS) w/Device KIT Use as directed to check blood sugars 03/04/17   Janith Lima, MD  calcitRIOL (ROCALTROL) 0.5 MCG capsule Take 0.5 mcg by mouth daily. 01/26/17   [provider]  carvedilol (COREG) 25 MG tablet Take 37.5 mg by mouth 2 (two) times daily with a meal.     [provider]  ciclopirox (PENLAC) 8 % solution Apply topically at bedtime. Apply over nail and  surrounding skin. Apply daily over previous coat. After seven (7) days, may remove with alcohol and continue cycle. 07/17/20   Criselda Peaches, DPM  glucose blood test strip Use TID 02/25/17   Janith Lima, MD  HUMALOG KWIKPEN 100 UNIT/ML KwikPen INJECT 15 UNITS SUBCUTANEOUSLY BEFORE MEAL(S) 12/21/18   Elayne Snare, MD  Insulin Degludec (TRESIBA FLEXTOUCH) 200 UNIT/ML SOPN Inject 32 Units into the skin daily. 07/19/18   Elayne Snare, MD  Insulin Pen Needle (NOVOFINE) 32G X 6 MM MISC 1 Act by Does not apply route 4 (four) times daily. 02/25/17   Janith Lima, MD  latanoprost (XALATAN) 0.005 % ophthalmic solution  10/02/17   [provider]  losartan (COZAAR) 50 MG tablet Take 50 mg by mouth daily.    [provider]  magnesium oxide (MAG-OX) 400 MG tablet Take 400 mg by mouth daily.    [provider]  mycophenolate (MYFORTIC) 180 MG EC tablet Take 540 mg by mouth 2 (two) times daily.    [provider]  tacrolimus (PROGRAF) 1 MG capsule Take 6-7 mg by mouth 2 (two) times daily. Take 63m in the morning and 632min the evening    [provider]  Vitamin D, Ergocalciferol, (DRISDOL) 50000 units CAPS capsule Take by mouth.    [provider]    Allergies    Hydrocodone  Review of Systems   Review of Systems  Constitutional: Negative for fever.  Respiratory: Positive for cough and shortness of breath.   All other systems reviewed and are negative.   Physical Exam Updated Vital Signs BP (!) 167/79   Pulse 60   Temp 98.3 F (36.8 C) (Oral)   Resp 18   SpO2 96%   Physical Exam Vitals and nursing note reviewed.  Constitutional:      Appearance: He is well-developed and well-nourished.  HENT:     Head: Normocephalic.  Eyes:     Extraocular Movements: EOM normal.  Cardiovascular:     Rate and Rhythm: Normal rate.  Pulmonary:     Breath sounds: Rhonchi present.  Abdominal:     General: There is no distension.  Musculoskeletal:         General: Normal range of motion.     Cervical back: Normal range of motion.  Skin:    General: Skin is warm.  Neurological:     General: No focal deficit present.     Mental Status: He is alert and oriented to person, place, and time.  Psychiatric:        Mood and Affect: Mood and affect and mood normal.     ED Results / Procedures / Treatments   Labs (all labs ordered are listed, but only abnormal results are displayed) Labs Reviewed  CBC - Abnormal; Notable for the following  components:      Result Value   WBC 3.0 (*)    RBC 3.12 (*)    Hemoglobin 7.2 (*)    HCT 24.9 (*)    MCV 79.8 (*)    MCH 23.1 (*)    MCHC 28.9 (*)    All other components within normal limits  BASIC METABOLIC PANEL - Abnormal; Notable for the following components:   CO2 21 (*)    Glucose, Bld 182 (*)    BUN 89 (*)    Creatinine, Ser 10.33 (*)    Calcium 8.8 (*)    GFR, Estimated 5 (*)    All other components within normal limits  BRAIN NATRIURETIC PEPTIDE - Abnormal; Notable for the following components:   B Natriuretic Peptide 1,802.9 (*)    All other components within normal limits  SARS CORONAVIRUS 2 BY RT PCR [facility] ORDER, [place] [facility])    EKG EKG Interpretation  Date/Time:  Saturday September 15 2020 21:23:10 EST Ventricular Rate:  63 PR Interval:  198 QRS Duration: 86 QT Interval:  476 QTC Calculation: 487 R Axis:     Text Interpretation: Normal sinus rhythm Prolonged QT Abnormal ECG Confirmed by Sherwood Gambler [phone]) on 09/16/2020 8:26:40 AM   Radiology DG Chest 2 View  Result Date: 09/15/2020 CLINICAL DATA:  Productive cough for 3 weeks. EXAM: CHEST - 2 VIEW COMPARISON:  Two-view chest x-ray 05/07/2017 FINDINGS: Heart size is upper limits of normal. Perihilar fullness is present bilaterally. Patchy airspace disease is present at the left greater than right base. Bilateral effusions are present. Right suprahilar airspace opacities are noted as well.  IMPRESSION: 1. Patchy bibasilar airspace disease, left greater than right, concerning for pneumonia. 2. Right upper lobe airspace disease is also concerning for pneumonia. 3. Bilateral pleural effusions. Electronically Signed   By: San Morelle M.D.   On: 09/15/2020 10:54    Procedures Procedures   Medications Ordered in ED Medications  albuterol (VENTOLIN HFA) 108 (90 Base) MCG/ACT inhaler 2 puff (has no administration in time range)  sodium chloride 0.9 % bolus 500 mL (has no administration in time range)    ED Course  I have reviewed the triage vital signs and the nursing notes.  Pertinent labs & imaging results that were available during my care of the patient were reviewed by me and considered in my medical decision making (see chart for details).    MDM Rules/Calculators/A&P                          MDM:  [facility]  They advised Dr. Posey Pronto in [place] is managing pt.  Dr. Posey Pronto will see and consult.  I spoke to Dr. Tamala Julian Hospitalist who will see pt for admission  Final Clinical Impression(s) / ED Diagnoses Final diagnoses:  Community acquired pneumonia, unspecified laterality  Acute renal failure superimposed on chronic kidney disease, unspecified CKD stage, unspecified acute renal failure type ([place])  Anemia, unspecified type    Rx / DC Orders ED Discharge Orders    None       Sidney Ace 09/16/20 1224    Lucrezia Starch, MD 10/01/20 [phone]

## 2020-09-16 NOTE — H&P (Addendum)
History and Physical    [name] JEH:[id number] DOB: [birth year] DOA: 09/15/2020  Referring MD/NP/PA: Alyse Low, PA-C PCP: Janith Lima, MD  Consultants: Elmarie Shiley, MD-nephrology Patient coming from: Urgent care  Chief Complaint: Cough and shortness of breath  I have personally briefly reviewed patient's old medical records in [place]   HPI: [name] is a 70 y.o. male with medical history significant of hypertension, hyperlipidemia, diabetes mellitus type 2, anemia of chronic disease, and s/p cadaveric kidney transplant in 2015 with chronic kidney disease presents with complaints of cough and shortness of breath.  Patient reports that over the last 2-3 weeks he has been unable to sleep at night due being awakened from coughing at around 1 or 2 AM.  The cough is productive and describes sputum as grayish milky color.  Notes associated symptoms of intermittent chills, intermittent chest heaviness with coughing, lower extremity swelling, and reports of weight loss.  He states that he received one COVID vaccine back in March 2021 and as a result that caused his kidney transplant to start filling in July.  He has been taking the transplant medications as prescribed and still able to make urine.  Denies having any significant nausea, vomiting, diarrhea, or blood in stool.  He had recently followed up with his nephrologist Dr. Posey Pronto and due to the worsening of his kidney function was told that he would likely need to go back on dialysis in near future.  ED Course: Upon admission to my department patient was seen to be afebrile with blood pressure 142/64-168/81, O2 saturations currently maintained on room air, and all other vital signs stable.  Labs from 2/5 significant for WBC 3, hemoglobin 7.2, potassium 4.3, BUN 89, creatinine 10.33, glucose 182, and BNP 1802.9.  Chest x-ray significant for bilateral pleural effusions along with patchy bibasilar opacity left greater than  right and right upper lobe opacity concerning for pneumonia.  Patient has been given 500 mL of normal saline IV fluid and albuterol inhaler.  COVID-19 screening was still pending.  Patient had initially been ordered vancomycin and cefepime.  Review of Systems  Constitutional: Positive for chills and weight loss. Negative for fever.  HENT: Positive for congestion. Negative for ear discharge and nosebleeds.   Eyes: Negative for double vision and photophobia.  Respiratory: Positive for cough, sputum production and shortness of breath.   Cardiovascular: Positive for chest pain, orthopnea and leg swelling.  Gastrointestinal: Negative for abdominal pain, blood in stool, nausea and vomiting.  Genitourinary: Negative for dysuria and hematuria.  Musculoskeletal: Negative for falls.  Skin: Negative for rash.  Neurological: Negative for focal weakness and loss of consciousness.  Psychiatric/Behavioral: Negative for substance abuse. The patient has insomnia.     Past Medical History:  Diagnosis Date  . Anemia   . Anemia   . Benign colon polyp 2012  . Clot    STATED TOLD IN [facility]IL CLOT TO LEFT SHOULDER. DOCTOR MADE AWARE  . Gout   . Headache(784.0)   . 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 HTN (hypertension)   . Hyperlipidemia   . PONV (postoperative nausea and vomiting)   . Renal failure     Past Surgical History:  Procedure Laterality Date  . AV FISTULA PLACEMENT  01/19/2012   Procedure: ARTERIOVENOUS (AV) FISTULA CREATION;  Surgeon: Conrad Austin, MD;  Location: [facility];  Service: Vascular;  Laterality: Right;  Right Brachio-cephalic arteriovenous fistula  . AV FISTULA PLACEMENT,  RADIOCEPHALIC  88/50/27   Right arm  . INSERTION OF DIALYSIS CATHETER  01/19/2012   Procedure: INSERTION OF DIALYSIS CATHETER;  Surgeon: Conrad [place], MD;  Location: [facility];  Service: Vascular;  Laterality: N/A;  right internal jugular vein  . 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     reports  that he has never smoked. He has never used smokeless tobacco. He reports that he does not drink alcohol and does not use drugs.  Allergies  Allergen Reactions  . Hydrocodone Itching    Family History  Problem Relation Age of Onset  . Diabetes Father   . Colon cancer Neg Hx   . Esophageal cancer Neg Hx   . Stomach cancer Neg Hx   . Rectal cancer Neg Hx     Prior to Admission medications   Medication Sig Start Date End Date Taking? Authorizing Provider  ACCU-CHEK SOFTCLIX LANCETS lancets 1 each by Other route 2 (two) times daily. Use to check blood sugars twice a day 03/04/17   Janith Lima, MD  acetaminophen (TYLENOL) 500 MG tablet Take 1,000 mg by mouth.    [provider]  amLODipine (NORVASC) 5 MG tablet Take 5 mg by mouth daily.    [provider]  atorvastatin (LIPITOR) 10 MG tablet Take 10 mg by mouth daily. 01/26/17   [provider]  Blood Glucose Monitoring Suppl (ACCU-CHEK AVIVA PLUS) w/Device KIT Use as directed to check blood sugars 03/04/17   Janith Lima, MD  calcitRIOL (ROCALTROL) 0.5 MCG capsule Take 0.5 mcg by mouth daily. 01/26/17   [provider]  carvedilol (COREG) 25 MG tablet Take 37.5 mg by mouth 2 (two) times daily with a meal.     [provider]  ciclopirox (PENLAC) 8 % solution Apply topically at bedtime. Apply over nail and surrounding skin. Apply daily over previous coat. After seven (7) days, may remove with alcohol and continue cycle. 07/17/20   Criselda Peaches, DPM  glucose blood test strip Use TID 02/25/17   Janith Lima, MD  HUMALOG KWIKPEN 100 UNIT/ML KwikPen INJECT 15 UNITS SUBCUTANEOUSLY BEFORE MEAL(S) 12/21/18   Elayne Snare, MD  Insulin Degludec (TRESIBA FLEXTOUCH) 200 UNIT/ML SOPN Inject 32 Units into the skin daily. 07/19/18   Elayne Snare, MD  Insulin Pen Needle (NOVOFINE) 32G X 6 MM MISC 1 Act by Does not apply route 4 (four) times daily. 02/25/17   Janith Lima, MD  latanoprost (XALATAN) 0.005 %  ophthalmic solution  10/02/17   [provider]  losartan (COZAAR) 50 MG tablet Take 50 mg by mouth daily.    [provider]  magnesium oxide (MAG-OX) 400 MG tablet Take 400 mg by mouth daily.    [provider]  mycophenolate (MYFORTIC) 180 MG EC tablet Take 540 mg by mouth 2 (two) times daily.    [provider]  tacrolimus (PROGRAF) 1 MG capsule Take 6-7 mg by mouth 2 (two) times daily. Take 75m in the morning and 6110min the evening    [provider]  Vitamin D, Ergocalciferol, (DRISDOL) 50000 units CAPS capsule Take by mouth.    [provider]    Physical Exam:  Constitutional: Elderly male who appears to be in no acute distress at this time.  Able to talk in complete sentences. Vitals:   09/16/20 0008 09/16/20 0352 09/16/20 0641 09/16/20 0822  BP: (!) 149/77 (!) 142/64 (!) 150/75 (!) 162/78  Pulse: 63 68 62 61  Resp: 16 16  18 20  Temp:      TempSrc:      SpO2: 100% 99% 100% 99%   Eyes: PERRL, lids and conjunctivae normal ENMT: Mucous membranes are moist. Posterior pharynx clear of any exudate or lesions. .  Neck: normal, supple, no masses, no thyromegaly.  Mild JVD. Respiratory: Normal respiratory effort with positive intermittent crackles appreciated.  Patient currently maintaining O2 saturations on room air. Cardiovascular: Regular rate and rhythm  with +2 pitting bilateral lower extremity edema appreciated.  Right upper arm fistula appreciated Abdomen: no tenderness, no masses palpated. No hepatosplenomegaly. Bowel sounds positive.  Musculoskeletal: no clubbing / cyanosis. No joint deformity upper and lower extremities. Good ROM, no contractures. Normal muscle tone.  Skin: no rashes, lesions, ulcers. No induration Neurologic: CN 2-12 grossly intact. Sensation intact, DTR normal. Strength 5/5 in all 4.  Psychiatric: Normal judgment and insight. Alert and oriented x 3. Normal mood.     Labs on Admission: I have personally  reviewed following labs and imaging studies  CBC: Recent Labs  Lab 09/15/20 2125  WBC 3.0*  HGB 7.2*  HCT 24.9*  MCV 79.8*  PLT 431   Basic Metabolic Panel: Recent Labs  Lab 09/15/20 2125  NA 139  K 4.3  CL 105  CO2 21*  GLUCOSE 182*  BUN 89*  CREATININE 10.33*  CALCIUM 8.8*   GFR: CrCl cannot be calculated (Unknown ideal weight.). Liver Function Tests: No results for input(s): AST, ALT, ALKPHOS, BILITOT, PROT, ALBUMIN in the last 168 hours. No results for input(s): LIPASE, AMYLASE in the last 168 hours. No results for input(s): AMMONIA in the last 168 hours. Coagulation Profile: No results for input(s): INR, PROTIME in the last 168 hours. Cardiac Enzymes: No results for input(s): CKTOTAL, CKMB, CKMBINDEX, TROPONINI in the last 168 hours. BNP (last 3 results) No results for input(s): PROBNP in the last 8760 hours. HbA1C: No results for input(s): HGBA1C in the last 72 hours. CBG: No results for input(s): GLUCAP in the last 168 hours. Lipid Profile: No results for input(s): CHOL, HDL, LDLCALC, TRIG, CHOLHDL, LDLDIRECT in the last 72 hours. Thyroid Function Tests: No results for input(s): TSH, T4TOTAL, FREET4, T3FREE, THYROIDAB in the last 72 hours. Anemia Panel: No results for input(s): VITAMINB12, FOLATE, FERRITIN, TIBC, IRON, RETICCTPCT in the last 72 hours. Urine analysis:    Component Value Date/Time   COLORURINE YELLOW 09/30/2011 1824   APPEARANCEUR CLEAR 09/30/2011 1824   LABSPEC 1.012 09/30/2011 1824   PHURINE 6.5 09/30/2011 1824   GLUCOSEU NEGATIVE 09/30/2011 1824   GLUCOSEU NEGATIVE 07/30/2011 1440   HGBUR LARGE (A) 09/30/2011 1824   BILIRUBINUR NEGATIVE 09/30/2011 1824   KETONESUR NEGATIVE 09/30/2011 1824   PROTEINUR >300 (A) 09/30/2011 1824   UROBILINOGEN 0.2 09/30/2011 1824   NITRITE NEGATIVE 09/30/2011 1824   LEUKOCYTESUR NEGATIVE 09/30/2011 1824   Sepsis Labs: No results found for this or any previous visit (from the past 240 hour(s)).    Radiological Exams on Admission: DG Chest 2 View  Result Date: 09/15/2020 CLINICAL DATA:  Productive cough for 3 weeks. EXAM: CHEST - 2 VIEW COMPARISON:  Two-view chest x-ray 05/07/2017 FINDINGS: Heart size is upper limits of normal. Perihilar fullness is present bilaterally. Patchy airspace disease is present at the left greater than right base. Bilateral effusions are present. Right suprahilar airspace opacities are noted as well. IMPRESSION: 1. Patchy bibasilar airspace disease, left greater than right, concerning for pneumonia. 2. Right upper lobe airspace disease is also concerning for pneumonia. 3. Bilateral pleural  effusions. Electronically Signed   By: San Morelle M.D.   On: 09/15/2020 10:54    EKG: Independently reviewed.  Sinus rhythm at 63 bpm with QTc 487  Assessment/Plan Community acquired pneumonia: Patient presents with complaints of cough, shortness of breath, and intermittent chills over the last 2 to 3-week.  Chest x-ray is noted signs of pneumonia with bilateral pleural effusions.  Patient had initially been ordered vancomycin and cefepime. -Admit to a telemetry bed -Continuous pulse oximetry with nasal cannula oxygen if needed -Check procalcitonin and sputum cultures and start -Follow-up COVID-19 screen(negative) -Incentive spirometry and flutter valve -Change antibiotics to Rocephin and doxycycline IV -Mucinex -Albuterol inhaler as needed  Fluid overload with bilateral pleural effusions diastolic congestive heart failure exacerbation: Patient was noted to have bilateral pleural effusions on chest x-ray with BNP elevated at 1802.9.  On physical exam patient with 2+ pitting bilateral lower extremity edema suggestive of patient being fluid overloaded. Last echocardiogram from 03/31/2020 revealed EF of 60 to 65% with signs of abnormal relaxation.  Suspect symptoms more likely from worsening renal function than diastolic congestive heart failure -Strict I&Os and Daily  weights -Renal carb modified diet with fluid restriction -Lasix 80 mg twice daily  Acute kidney injury superimposed chronic kidney disease IV s/p cadaver renal transplant on chronic immunosuppressive: Patient presents with creatinine elevated up to 10.33 with BUN 89.  Potassium appears to be within normal limits at 4.3.  Records note creatinine was around 5 in August 2021.  Cadaver kidney placed initially in 2015, but reportedly started feeling after he receive the COVID vaccine in March 2021.  He has been taking immunosuppressive therapies and followed by Dr. Posey Pronto in outpatient setting.  He has right upper extremity fistula in place and appears to be nearing need of dialysis. -Check urinalysis -Continue immunosuppressive therapies once medication regimen reconciled -Check renal function panel daily -Appreciate nephrology consultative services, will follow-up for further recommendation  Leukopenia: Acute.  WBC 3 on admission.  Patient initially had normal vital signs, but was noted to have some tachypnea that would qualify for SIRS criteria.  However it seems more so multifactorial in the setting of pneumonia with fluid overload. -Continue to monitor  Chest pain: Acute.  Patient reports having intermittent episodes of chest heaviness during this time.  EKG without any significant ischemic changes.  Suspect symptoms secondary -Check cardiac troponin -Check repeat echocardiogram  Anemia of chronic kidney disease: Hemoglobin 7.2 g/dL on admission on 2/5.  Previously noted to be 8.8 back in September 2021.  Patient denies having any reports of bleeding. -Type and screen for possible need of blood product  -Check repeat CBC -Given current history questionnaire  Diabetes mellitus type 2, well controlled: On admission glucose mildly elevated at 182.  Last hemoglobin A1c 6.6 on 04/01/2020.   -Hypoglycemic protocol -CBGs before every meal and at bedtime with moderate SSI -Adjust insulin regimen as  needed  Prolonged QT interval: QTC noted to be 487 on admission -Avoid any further QT prolonging medications  Essential hypertension: On admission blood pressures 142/64-179/89 Home medication regimen includes Coreg 37.5 mg twice daily and amlodipine 5 mg daily. -Continue current medication regimen  Metabolic acidosis: Acute on chronic.  Patient with initial CO2 of 21 without elevated anion gap -Continue to monitor  DVT prophylaxis: Heparin Code Status: Full Family Communication: Daughter at bedside updated as well as another daughter over the phone who is in internal medicine Disposition Plan: Hopefully discharge home in 2-3 Consults called: Nephrology Admission status: Inpatient,  require at least 2-3 midnight stay due to fluid overload and concern for pneumonia.  Norval Morton MD [facility]   If 7PM-7AM, please contact night-coverage   09/16/2020, 9:14 AM

## 2020-09-17 ENCOUNTER — Encounter: Payer: Self-pay | Admitting: Internal Medicine

## 2020-09-17 LAB — LEGIONELLA PNEUMOPHILA SEROGP 1 UR AG: L. pneumophila Serogp 1 Ur Ag: NEGATIVE

## 2020-09-17 NOTE — Progress Notes (Signed)
Patient had signed himself out on 2/6 after being seen by myself and Nephrology in the afternoon.  Patient felt that he was not being monitored and alarms were going off and no one was attending to him after talks with him by phone today. It was his understanding or lack there of that he may start on HD on 2/8. Patient had been scheduled to start HD today 2/7. It is unclear if patient actually signed AMA  paperwork or was advised by Joana Reamer sing prior to being discharged. I as attending was not made aware of these actions on 2/6.

## 2020-09-19 DIAGNOSIS — D688 Other specified coagulation defects: Secondary | ICD-10-CM | POA: Diagnosis not present

## 2020-09-19 DIAGNOSIS — Z992 Dependence on renal dialysis: Secondary | ICD-10-CM | POA: Diagnosis not present

## 2020-09-19 DIAGNOSIS — N186 End stage renal disease: Secondary | ICD-10-CM | POA: Diagnosis not present

## 2020-09-19 DIAGNOSIS — N2581 Secondary hyperparathyroidism of renal origin: Secondary | ICD-10-CM | POA: Diagnosis not present

## 2020-09-21 DIAGNOSIS — N186 End stage renal disease: Secondary | ICD-10-CM | POA: Diagnosis not present

## 2020-09-21 DIAGNOSIS — N2581 Secondary hyperparathyroidism of renal origin: Secondary | ICD-10-CM | POA: Diagnosis not present

## 2020-09-21 DIAGNOSIS — Z992 Dependence on renal dialysis: Secondary | ICD-10-CM | POA: Diagnosis not present

## 2020-09-21 DIAGNOSIS — D688 Other specified coagulation defects: Secondary | ICD-10-CM | POA: Diagnosis not present

## 2020-09-24 DIAGNOSIS — N2581 Secondary hyperparathyroidism of renal origin: Secondary | ICD-10-CM | POA: Diagnosis not present

## 2020-09-24 DIAGNOSIS — N186 End stage renal disease: Secondary | ICD-10-CM | POA: Diagnosis not present

## 2020-09-24 DIAGNOSIS — Z992 Dependence on renal dialysis: Secondary | ICD-10-CM | POA: Diagnosis not present

## 2020-09-24 DIAGNOSIS — D688 Other specified coagulation defects: Secondary | ICD-10-CM | POA: Diagnosis not present

## 2020-09-26 DIAGNOSIS — Z992 Dependence on renal dialysis: Secondary | ICD-10-CM | POA: Diagnosis not present

## 2020-09-26 DIAGNOSIS — D688 Other specified coagulation defects: Secondary | ICD-10-CM | POA: Diagnosis not present

## 2020-09-26 DIAGNOSIS — N186 End stage renal disease: Secondary | ICD-10-CM | POA: Diagnosis not present

## 2020-09-26 DIAGNOSIS — N2581 Secondary hyperparathyroidism of renal origin: Secondary | ICD-10-CM | POA: Diagnosis not present

## 2020-09-28 DIAGNOSIS — N2581 Secondary hyperparathyroidism of renal origin: Secondary | ICD-10-CM | POA: Diagnosis not present

## 2020-09-28 DIAGNOSIS — D688 Other specified coagulation defects: Secondary | ICD-10-CM | POA: Diagnosis not present

## 2020-09-28 DIAGNOSIS — Z992 Dependence on renal dialysis: Secondary | ICD-10-CM | POA: Diagnosis not present

## 2020-09-28 DIAGNOSIS — N186 End stage renal disease: Secondary | ICD-10-CM | POA: Diagnosis not present

## 2020-10-01 DIAGNOSIS — N186 End stage renal disease: Secondary | ICD-10-CM | POA: Diagnosis not present

## 2020-10-01 DIAGNOSIS — D688 Other specified coagulation defects: Secondary | ICD-10-CM | POA: Diagnosis not present

## 2020-10-01 DIAGNOSIS — Z992 Dependence on renal dialysis: Secondary | ICD-10-CM | POA: Diagnosis not present

## 2020-10-01 DIAGNOSIS — N2581 Secondary hyperparathyroidism of renal origin: Secondary | ICD-10-CM | POA: Diagnosis not present

## 2020-10-03 DIAGNOSIS — D688 Other specified coagulation defects: Secondary | ICD-10-CM | POA: Diagnosis not present

## 2020-10-03 DIAGNOSIS — Z992 Dependence on renal dialysis: Secondary | ICD-10-CM | POA: Diagnosis not present

## 2020-10-03 DIAGNOSIS — N186 End stage renal disease: Secondary | ICD-10-CM | POA: Diagnosis not present

## 2020-10-03 DIAGNOSIS — N2581 Secondary hyperparathyroidism of renal origin: Secondary | ICD-10-CM | POA: Diagnosis not present

## 2020-10-05 DIAGNOSIS — N186 End stage renal disease: Secondary | ICD-10-CM | POA: Diagnosis not present

## 2020-10-05 DIAGNOSIS — D688 Other specified coagulation defects: Secondary | ICD-10-CM | POA: Diagnosis not present

## 2020-10-05 DIAGNOSIS — Z992 Dependence on renal dialysis: Secondary | ICD-10-CM | POA: Diagnosis not present

## 2020-10-05 DIAGNOSIS — N2581 Secondary hyperparathyroidism of renal origin: Secondary | ICD-10-CM | POA: Diagnosis not present

## 2020-10-08 DIAGNOSIS — T8610 Unspecified complication of kidney transplant: Secondary | ICD-10-CM | POA: Diagnosis not present

## 2020-10-08 DIAGNOSIS — N186 End stage renal disease: Secondary | ICD-10-CM | POA: Diagnosis not present

## 2020-10-08 DIAGNOSIS — D688 Other specified coagulation defects: Secondary | ICD-10-CM | POA: Diagnosis not present

## 2020-10-08 DIAGNOSIS — Z992 Dependence on renal dialysis: Secondary | ICD-10-CM | POA: Diagnosis not present

## 2020-10-08 DIAGNOSIS — N2581 Secondary hyperparathyroidism of renal origin: Secondary | ICD-10-CM | POA: Diagnosis not present

## 2020-10-10 DIAGNOSIS — D688 Other specified coagulation defects: Secondary | ICD-10-CM | POA: Diagnosis not present

## 2020-10-10 DIAGNOSIS — Z992 Dependence on renal dialysis: Secondary | ICD-10-CM | POA: Diagnosis not present

## 2020-10-10 DIAGNOSIS — N186 End stage renal disease: Secondary | ICD-10-CM | POA: Diagnosis not present

## 2020-10-10 DIAGNOSIS — N2581 Secondary hyperparathyroidism of renal origin: Secondary | ICD-10-CM | POA: Diagnosis not present

## 2020-10-12 DIAGNOSIS — N2581 Secondary hyperparathyroidism of renal origin: Secondary | ICD-10-CM | POA: Diagnosis not present

## 2020-10-12 DIAGNOSIS — N186 End stage renal disease: Secondary | ICD-10-CM | POA: Diagnosis not present

## 2020-10-12 DIAGNOSIS — Z992 Dependence on renal dialysis: Secondary | ICD-10-CM | POA: Diagnosis not present

## 2020-10-12 DIAGNOSIS — D688 Other specified coagulation defects: Secondary | ICD-10-CM | POA: Diagnosis not present

## 2020-10-15 DIAGNOSIS — Z992 Dependence on renal dialysis: Secondary | ICD-10-CM | POA: Diagnosis not present

## 2020-10-15 DIAGNOSIS — N186 End stage renal disease: Secondary | ICD-10-CM | POA: Diagnosis not present

## 2020-10-15 DIAGNOSIS — D688 Other specified coagulation defects: Secondary | ICD-10-CM | POA: Diagnosis not present

## 2020-10-15 DIAGNOSIS — N2581 Secondary hyperparathyroidism of renal origin: Secondary | ICD-10-CM | POA: Diagnosis not present

## 2020-10-16 ENCOUNTER — Ambulatory Visit: Payer: Medicare Other | Admitting: Podiatry

## 2020-10-17 DIAGNOSIS — N2581 Secondary hyperparathyroidism of renal origin: Secondary | ICD-10-CM | POA: Diagnosis not present

## 2020-10-17 DIAGNOSIS — Z992 Dependence on renal dialysis: Secondary | ICD-10-CM | POA: Diagnosis not present

## 2020-10-17 DIAGNOSIS — N186 End stage renal disease: Secondary | ICD-10-CM | POA: Diagnosis not present

## 2020-10-17 DIAGNOSIS — D688 Other specified coagulation defects: Secondary | ICD-10-CM | POA: Diagnosis not present

## 2020-10-19 DIAGNOSIS — N186 End stage renal disease: Secondary | ICD-10-CM | POA: Diagnosis not present

## 2020-10-19 DIAGNOSIS — N2581 Secondary hyperparathyroidism of renal origin: Secondary | ICD-10-CM | POA: Diagnosis not present

## 2020-10-19 DIAGNOSIS — Z992 Dependence on renal dialysis: Secondary | ICD-10-CM | POA: Diagnosis not present

## 2020-10-19 DIAGNOSIS — D688 Other specified coagulation defects: Secondary | ICD-10-CM | POA: Diagnosis not present

## 2020-10-22 DIAGNOSIS — N2581 Secondary hyperparathyroidism of renal origin: Secondary | ICD-10-CM | POA: Diagnosis not present

## 2020-10-22 DIAGNOSIS — D688 Other specified coagulation defects: Secondary | ICD-10-CM | POA: Diagnosis not present

## 2020-10-22 DIAGNOSIS — N186 End stage renal disease: Secondary | ICD-10-CM | POA: Diagnosis not present

## 2020-10-22 DIAGNOSIS — Z992 Dependence on renal dialysis: Secondary | ICD-10-CM | POA: Diagnosis not present

## 2020-10-24 DIAGNOSIS — D688 Other specified coagulation defects: Secondary | ICD-10-CM | POA: Diagnosis not present

## 2020-10-24 DIAGNOSIS — Z992 Dependence on renal dialysis: Secondary | ICD-10-CM | POA: Diagnosis not present

## 2020-10-24 DIAGNOSIS — N186 End stage renal disease: Secondary | ICD-10-CM | POA: Diagnosis not present

## 2020-10-24 DIAGNOSIS — N2581 Secondary hyperparathyroidism of renal origin: Secondary | ICD-10-CM | POA: Diagnosis not present

## 2020-10-26 DIAGNOSIS — N2581 Secondary hyperparathyroidism of renal origin: Secondary | ICD-10-CM | POA: Diagnosis not present

## 2020-10-26 DIAGNOSIS — Z992 Dependence on renal dialysis: Secondary | ICD-10-CM | POA: Diagnosis not present

## 2020-10-26 DIAGNOSIS — N186 End stage renal disease: Secondary | ICD-10-CM | POA: Diagnosis not present

## 2020-10-26 DIAGNOSIS — D688 Other specified coagulation defects: Secondary | ICD-10-CM | POA: Diagnosis not present

## 2020-10-29 DIAGNOSIS — D688 Other specified coagulation defects: Secondary | ICD-10-CM | POA: Diagnosis not present

## 2020-10-29 DIAGNOSIS — N186 End stage renal disease: Secondary | ICD-10-CM | POA: Diagnosis not present

## 2020-10-29 DIAGNOSIS — Z992 Dependence on renal dialysis: Secondary | ICD-10-CM | POA: Diagnosis not present

## 2020-10-29 DIAGNOSIS — N2581 Secondary hyperparathyroidism of renal origin: Secondary | ICD-10-CM | POA: Diagnosis not present

## 2020-10-29 DIAGNOSIS — R3915 Urgency of urination: Secondary | ICD-10-CM | POA: Diagnosis not present

## 2020-10-31 DIAGNOSIS — N186 End stage renal disease: Secondary | ICD-10-CM | POA: Diagnosis not present

## 2020-10-31 DIAGNOSIS — N2581 Secondary hyperparathyroidism of renal origin: Secondary | ICD-10-CM | POA: Diagnosis not present

## 2020-10-31 DIAGNOSIS — Z992 Dependence on renal dialysis: Secondary | ICD-10-CM | POA: Diagnosis not present

## 2020-10-31 DIAGNOSIS — R3915 Urgency of urination: Secondary | ICD-10-CM | POA: Diagnosis not present

## 2020-10-31 DIAGNOSIS — D688 Other specified coagulation defects: Secondary | ICD-10-CM | POA: Diagnosis not present

## 2020-11-02 DIAGNOSIS — D688 Other specified coagulation defects: Secondary | ICD-10-CM | POA: Diagnosis not present

## 2020-11-02 DIAGNOSIS — Z992 Dependence on renal dialysis: Secondary | ICD-10-CM | POA: Diagnosis not present

## 2020-11-02 DIAGNOSIS — N186 End stage renal disease: Secondary | ICD-10-CM | POA: Diagnosis not present

## 2020-11-02 DIAGNOSIS — R3915 Urgency of urination: Secondary | ICD-10-CM | POA: Diagnosis not present

## 2020-11-02 DIAGNOSIS — N2581 Secondary hyperparathyroidism of renal origin: Secondary | ICD-10-CM | POA: Diagnosis not present

## 2020-11-05 DIAGNOSIS — N186 End stage renal disease: Secondary | ICD-10-CM | POA: Diagnosis not present

## 2020-11-05 DIAGNOSIS — D688 Other specified coagulation defects: Secondary | ICD-10-CM | POA: Diagnosis not present

## 2020-11-05 DIAGNOSIS — Z992 Dependence on renal dialysis: Secondary | ICD-10-CM | POA: Diagnosis not present

## 2020-11-05 DIAGNOSIS — N2581 Secondary hyperparathyroidism of renal origin: Secondary | ICD-10-CM | POA: Diagnosis not present

## 2020-11-07 DIAGNOSIS — N186 End stage renal disease: Secondary | ICD-10-CM | POA: Diagnosis not present

## 2020-11-07 DIAGNOSIS — Z992 Dependence on renal dialysis: Secondary | ICD-10-CM | POA: Diagnosis not present

## 2020-11-07 DIAGNOSIS — N2581 Secondary hyperparathyroidism of renal origin: Secondary | ICD-10-CM | POA: Diagnosis not present

## 2020-11-07 DIAGNOSIS — D688 Other specified coagulation defects: Secondary | ICD-10-CM | POA: Diagnosis not present

## 2020-11-08 DIAGNOSIS — T8610 Unspecified complication of kidney transplant: Secondary | ICD-10-CM | POA: Diagnosis not present

## 2020-11-08 DIAGNOSIS — N186 End stage renal disease: Secondary | ICD-10-CM | POA: Diagnosis not present

## 2020-11-08 DIAGNOSIS — Z992 Dependence on renal dialysis: Secondary | ICD-10-CM | POA: Diagnosis not present

## 2020-11-12 DIAGNOSIS — N186 End stage renal disease: Secondary | ICD-10-CM | POA: Diagnosis not present

## 2020-11-12 DIAGNOSIS — N2581 Secondary hyperparathyroidism of renal origin: Secondary | ICD-10-CM | POA: Diagnosis not present

## 2020-11-12 DIAGNOSIS — Z992 Dependence on renal dialysis: Secondary | ICD-10-CM | POA: Diagnosis not present

## 2020-11-12 DIAGNOSIS — D688 Other specified coagulation defects: Secondary | ICD-10-CM | POA: Diagnosis not present

## 2020-11-14 DIAGNOSIS — Z992 Dependence on renal dialysis: Secondary | ICD-10-CM | POA: Diagnosis not present

## 2020-11-14 DIAGNOSIS — N186 End stage renal disease: Secondary | ICD-10-CM | POA: Diagnosis not present

## 2020-11-14 DIAGNOSIS — D688 Other specified coagulation defects: Secondary | ICD-10-CM | POA: Diagnosis not present

## 2020-11-14 DIAGNOSIS — N2581 Secondary hyperparathyroidism of renal origin: Secondary | ICD-10-CM | POA: Diagnosis not present

## 2020-11-16 DIAGNOSIS — N186 End stage renal disease: Secondary | ICD-10-CM | POA: Diagnosis not present

## 2020-11-16 DIAGNOSIS — Z992 Dependence on renal dialysis: Secondary | ICD-10-CM | POA: Diagnosis not present

## 2020-11-16 DIAGNOSIS — D688 Other specified coagulation defects: Secondary | ICD-10-CM | POA: Diagnosis not present

## 2020-11-16 DIAGNOSIS — N2581 Secondary hyperparathyroidism of renal origin: Secondary | ICD-10-CM | POA: Diagnosis not present

## 2020-11-19 DIAGNOSIS — D688 Other specified coagulation defects: Secondary | ICD-10-CM | POA: Diagnosis not present

## 2020-11-19 DIAGNOSIS — N2581 Secondary hyperparathyroidism of renal origin: Secondary | ICD-10-CM | POA: Diagnosis not present

## 2020-11-19 DIAGNOSIS — Z992 Dependence on renal dialysis: Secondary | ICD-10-CM | POA: Diagnosis not present

## 2020-11-19 DIAGNOSIS — N186 End stage renal disease: Secondary | ICD-10-CM | POA: Diagnosis not present

## 2020-11-21 DIAGNOSIS — N2581 Secondary hyperparathyroidism of renal origin: Secondary | ICD-10-CM | POA: Diagnosis not present

## 2020-11-21 DIAGNOSIS — Z992 Dependence on renal dialysis: Secondary | ICD-10-CM | POA: Diagnosis not present

## 2020-11-21 DIAGNOSIS — N186 End stage renal disease: Secondary | ICD-10-CM | POA: Diagnosis not present

## 2020-11-21 DIAGNOSIS — D688 Other specified coagulation defects: Secondary | ICD-10-CM | POA: Diagnosis not present

## 2020-11-23 DIAGNOSIS — N2581 Secondary hyperparathyroidism of renal origin: Secondary | ICD-10-CM | POA: Diagnosis not present

## 2020-11-23 DIAGNOSIS — N186 End stage renal disease: Secondary | ICD-10-CM | POA: Diagnosis not present

## 2020-11-23 DIAGNOSIS — D688 Other specified coagulation defects: Secondary | ICD-10-CM | POA: Diagnosis not present

## 2020-11-23 DIAGNOSIS — Z992 Dependence on renal dialysis: Secondary | ICD-10-CM | POA: Diagnosis not present

## 2020-11-28 DIAGNOSIS — N2581 Secondary hyperparathyroidism of renal origin: Secondary | ICD-10-CM | POA: Diagnosis not present

## 2020-11-28 DIAGNOSIS — N186 End stage renal disease: Secondary | ICD-10-CM | POA: Diagnosis not present

## 2020-11-28 DIAGNOSIS — Z992 Dependence on renal dialysis: Secondary | ICD-10-CM | POA: Diagnosis not present

## 2020-11-28 DIAGNOSIS — D688 Other specified coagulation defects: Secondary | ICD-10-CM | POA: Diagnosis not present

## 2020-11-30 DIAGNOSIS — Z992 Dependence on renal dialysis: Secondary | ICD-10-CM | POA: Diagnosis not present

## 2020-11-30 DIAGNOSIS — N2581 Secondary hyperparathyroidism of renal origin: Secondary | ICD-10-CM | POA: Diagnosis not present

## 2020-11-30 DIAGNOSIS — D688 Other specified coagulation defects: Secondary | ICD-10-CM | POA: Diagnosis not present

## 2020-11-30 DIAGNOSIS — N186 End stage renal disease: Secondary | ICD-10-CM | POA: Diagnosis not present

## 2020-12-03 DIAGNOSIS — N2581 Secondary hyperparathyroidism of renal origin: Secondary | ICD-10-CM | POA: Diagnosis not present

## 2020-12-03 DIAGNOSIS — Z992 Dependence on renal dialysis: Secondary | ICD-10-CM | POA: Diagnosis not present

## 2020-12-03 DIAGNOSIS — N186 End stage renal disease: Secondary | ICD-10-CM | POA: Diagnosis not present

## 2020-12-03 DIAGNOSIS — D688 Other specified coagulation defects: Secondary | ICD-10-CM | POA: Diagnosis not present

## 2020-12-07 DIAGNOSIS — Z992 Dependence on renal dialysis: Secondary | ICD-10-CM | POA: Diagnosis not present

## 2020-12-07 DIAGNOSIS — N2581 Secondary hyperparathyroidism of renal origin: Secondary | ICD-10-CM | POA: Diagnosis not present

## 2020-12-07 DIAGNOSIS — N186 End stage renal disease: Secondary | ICD-10-CM | POA: Diagnosis not present

## 2020-12-07 DIAGNOSIS — D688 Other specified coagulation defects: Secondary | ICD-10-CM | POA: Diagnosis not present

## 2020-12-08 DIAGNOSIS — N186 End stage renal disease: Secondary | ICD-10-CM | POA: Diagnosis not present

## 2020-12-08 DIAGNOSIS — T8610 Unspecified complication of kidney transplant: Secondary | ICD-10-CM | POA: Diagnosis not present

## 2020-12-08 DIAGNOSIS — Z992 Dependence on renal dialysis: Secondary | ICD-10-CM | POA: Diagnosis not present

## 2020-12-10 DIAGNOSIS — D688 Other specified coagulation defects: Secondary | ICD-10-CM | POA: Diagnosis not present

## 2020-12-10 DIAGNOSIS — N2581 Secondary hyperparathyroidism of renal origin: Secondary | ICD-10-CM | POA: Diagnosis not present

## 2020-12-10 DIAGNOSIS — Z992 Dependence on renal dialysis: Secondary | ICD-10-CM | POA: Diagnosis not present

## 2020-12-10 DIAGNOSIS — N186 End stage renal disease: Secondary | ICD-10-CM | POA: Diagnosis not present

## 2020-12-12 DIAGNOSIS — N2581 Secondary hyperparathyroidism of renal origin: Secondary | ICD-10-CM | POA: Diagnosis not present

## 2020-12-12 DIAGNOSIS — N186 End stage renal disease: Secondary | ICD-10-CM | POA: Diagnosis not present

## 2020-12-12 DIAGNOSIS — Z992 Dependence on renal dialysis: Secondary | ICD-10-CM | POA: Diagnosis not present

## 2020-12-12 DIAGNOSIS — D688 Other specified coagulation defects: Secondary | ICD-10-CM | POA: Diagnosis not present

## 2020-12-14 DIAGNOSIS — N186 End stage renal disease: Secondary | ICD-10-CM | POA: Diagnosis not present

## 2020-12-14 DIAGNOSIS — N2581 Secondary hyperparathyroidism of renal origin: Secondary | ICD-10-CM | POA: Diagnosis not present

## 2020-12-14 DIAGNOSIS — Z992 Dependence on renal dialysis: Secondary | ICD-10-CM | POA: Diagnosis not present

## 2020-12-14 DIAGNOSIS — D688 Other specified coagulation defects: Secondary | ICD-10-CM | POA: Diagnosis not present

## 2020-12-17 DIAGNOSIS — Z992 Dependence on renal dialysis: Secondary | ICD-10-CM | POA: Diagnosis not present

## 2020-12-17 DIAGNOSIS — N186 End stage renal disease: Secondary | ICD-10-CM | POA: Diagnosis not present

## 2020-12-17 DIAGNOSIS — N2581 Secondary hyperparathyroidism of renal origin: Secondary | ICD-10-CM | POA: Diagnosis not present

## 2020-12-17 DIAGNOSIS — D688 Other specified coagulation defects: Secondary | ICD-10-CM | POA: Diagnosis not present

## 2020-12-19 DIAGNOSIS — N186 End stage renal disease: Secondary | ICD-10-CM | POA: Diagnosis not present

## 2020-12-19 DIAGNOSIS — D688 Other specified coagulation defects: Secondary | ICD-10-CM | POA: Diagnosis not present

## 2020-12-19 DIAGNOSIS — Z992 Dependence on renal dialysis: Secondary | ICD-10-CM | POA: Diagnosis not present

## 2020-12-19 DIAGNOSIS — N2581 Secondary hyperparathyroidism of renal origin: Secondary | ICD-10-CM | POA: Diagnosis not present

## 2020-12-21 DIAGNOSIS — N186 End stage renal disease: Secondary | ICD-10-CM | POA: Diagnosis not present

## 2020-12-21 DIAGNOSIS — Z992 Dependence on renal dialysis: Secondary | ICD-10-CM | POA: Diagnosis not present

## 2020-12-21 DIAGNOSIS — N2581 Secondary hyperparathyroidism of renal origin: Secondary | ICD-10-CM | POA: Diagnosis not present

## 2020-12-21 DIAGNOSIS — D688 Other specified coagulation defects: Secondary | ICD-10-CM | POA: Diagnosis not present

## 2020-12-24 DIAGNOSIS — Z992 Dependence on renal dialysis: Secondary | ICD-10-CM | POA: Diagnosis not present

## 2020-12-24 DIAGNOSIS — D688 Other specified coagulation defects: Secondary | ICD-10-CM | POA: Diagnosis not present

## 2020-12-24 DIAGNOSIS — N2581 Secondary hyperparathyroidism of renal origin: Secondary | ICD-10-CM | POA: Diagnosis not present

## 2020-12-24 DIAGNOSIS — N186 End stage renal disease: Secondary | ICD-10-CM | POA: Diagnosis not present

## 2020-12-26 DIAGNOSIS — Z992 Dependence on renal dialysis: Secondary | ICD-10-CM | POA: Diagnosis not present

## 2020-12-26 DIAGNOSIS — N2581 Secondary hyperparathyroidism of renal origin: Secondary | ICD-10-CM | POA: Diagnosis not present

## 2020-12-26 DIAGNOSIS — D688 Other specified coagulation defects: Secondary | ICD-10-CM | POA: Diagnosis not present

## 2020-12-26 DIAGNOSIS — N186 End stage renal disease: Secondary | ICD-10-CM | POA: Diagnosis not present

## 2020-12-28 DIAGNOSIS — N186 End stage renal disease: Secondary | ICD-10-CM | POA: Diagnosis not present

## 2020-12-28 DIAGNOSIS — Z992 Dependence on renal dialysis: Secondary | ICD-10-CM | POA: Diagnosis not present

## 2020-12-28 DIAGNOSIS — D688 Other specified coagulation defects: Secondary | ICD-10-CM | POA: Diagnosis not present

## 2020-12-28 DIAGNOSIS — N2581 Secondary hyperparathyroidism of renal origin: Secondary | ICD-10-CM | POA: Diagnosis not present

## 2020-12-31 DIAGNOSIS — Z992 Dependence on renal dialysis: Secondary | ICD-10-CM | POA: Diagnosis not present

## 2020-12-31 DIAGNOSIS — N2581 Secondary hyperparathyroidism of renal origin: Secondary | ICD-10-CM | POA: Diagnosis not present

## 2020-12-31 DIAGNOSIS — N186 End stage renal disease: Secondary | ICD-10-CM | POA: Diagnosis not present

## 2020-12-31 DIAGNOSIS — D688 Other specified coagulation defects: Secondary | ICD-10-CM | POA: Diagnosis not present

## 2021-01-02 DIAGNOSIS — D688 Other specified coagulation defects: Secondary | ICD-10-CM | POA: Diagnosis not present

## 2021-01-02 DIAGNOSIS — N2581 Secondary hyperparathyroidism of renal origin: Secondary | ICD-10-CM | POA: Diagnosis not present

## 2021-01-02 DIAGNOSIS — Z992 Dependence on renal dialysis: Secondary | ICD-10-CM | POA: Diagnosis not present

## 2021-01-02 DIAGNOSIS — N186 End stage renal disease: Secondary | ICD-10-CM | POA: Diagnosis not present

## 2021-01-04 DIAGNOSIS — D688 Other specified coagulation defects: Secondary | ICD-10-CM | POA: Diagnosis not present

## 2021-01-04 DIAGNOSIS — N2581 Secondary hyperparathyroidism of renal origin: Secondary | ICD-10-CM | POA: Diagnosis not present

## 2021-01-04 DIAGNOSIS — Z992 Dependence on renal dialysis: Secondary | ICD-10-CM | POA: Diagnosis not present

## 2021-01-04 DIAGNOSIS — N186 End stage renal disease: Secondary | ICD-10-CM | POA: Diagnosis not present

## 2021-01-07 DIAGNOSIS — N2581 Secondary hyperparathyroidism of renal origin: Secondary | ICD-10-CM | POA: Diagnosis not present

## 2021-01-07 DIAGNOSIS — D688 Other specified coagulation defects: Secondary | ICD-10-CM | POA: Diagnosis not present

## 2021-01-07 DIAGNOSIS — N186 End stage renal disease: Secondary | ICD-10-CM | POA: Diagnosis not present

## 2021-01-07 DIAGNOSIS — Z992 Dependence on renal dialysis: Secondary | ICD-10-CM | POA: Diagnosis not present

## 2021-01-08 DIAGNOSIS — T8610 Unspecified complication of kidney transplant: Secondary | ICD-10-CM | POA: Diagnosis not present

## 2021-01-08 DIAGNOSIS — Z992 Dependence on renal dialysis: Secondary | ICD-10-CM | POA: Diagnosis not present

## 2021-01-08 DIAGNOSIS — N186 End stage renal disease: Secondary | ICD-10-CM | POA: Diagnosis not present

## 2021-01-09 DIAGNOSIS — N186 End stage renal disease: Secondary | ICD-10-CM | POA: Diagnosis not present

## 2021-01-09 DIAGNOSIS — N2581 Secondary hyperparathyroidism of renal origin: Secondary | ICD-10-CM | POA: Diagnosis not present

## 2021-01-09 DIAGNOSIS — Z992 Dependence on renal dialysis: Secondary | ICD-10-CM | POA: Diagnosis not present

## 2021-01-09 DIAGNOSIS — D688 Other specified coagulation defects: Secondary | ICD-10-CM | POA: Diagnosis not present

## 2021-01-11 DIAGNOSIS — N186 End stage renal disease: Secondary | ICD-10-CM | POA: Diagnosis not present

## 2021-01-11 DIAGNOSIS — N2581 Secondary hyperparathyroidism of renal origin: Secondary | ICD-10-CM | POA: Diagnosis not present

## 2021-01-11 DIAGNOSIS — D688 Other specified coagulation defects: Secondary | ICD-10-CM | POA: Diagnosis not present

## 2021-01-11 DIAGNOSIS — Z992 Dependence on renal dialysis: Secondary | ICD-10-CM | POA: Diagnosis not present

## 2021-01-14 DIAGNOSIS — N2581 Secondary hyperparathyroidism of renal origin: Secondary | ICD-10-CM | POA: Diagnosis not present

## 2021-01-14 DIAGNOSIS — D688 Other specified coagulation defects: Secondary | ICD-10-CM | POA: Diagnosis not present

## 2021-01-14 DIAGNOSIS — Z992 Dependence on renal dialysis: Secondary | ICD-10-CM | POA: Diagnosis not present

## 2021-01-14 DIAGNOSIS — N186 End stage renal disease: Secondary | ICD-10-CM | POA: Diagnosis not present

## 2021-01-16 DIAGNOSIS — N2581 Secondary hyperparathyroidism of renal origin: Secondary | ICD-10-CM | POA: Diagnosis not present

## 2021-01-16 DIAGNOSIS — Z992 Dependence on renal dialysis: Secondary | ICD-10-CM | POA: Diagnosis not present

## 2021-01-16 DIAGNOSIS — N186 End stage renal disease: Secondary | ICD-10-CM | POA: Diagnosis not present

## 2021-01-16 DIAGNOSIS — D688 Other specified coagulation defects: Secondary | ICD-10-CM | POA: Diagnosis not present

## 2021-01-18 DIAGNOSIS — Z992 Dependence on renal dialysis: Secondary | ICD-10-CM | POA: Diagnosis not present

## 2021-01-18 DIAGNOSIS — N186 End stage renal disease: Secondary | ICD-10-CM | POA: Diagnosis not present

## 2021-01-18 DIAGNOSIS — D688 Other specified coagulation defects: Secondary | ICD-10-CM | POA: Diagnosis not present

## 2021-01-18 DIAGNOSIS — N2581 Secondary hyperparathyroidism of renal origin: Secondary | ICD-10-CM | POA: Diagnosis not present

## 2021-01-21 DIAGNOSIS — N186 End stage renal disease: Secondary | ICD-10-CM | POA: Diagnosis not present

## 2021-01-21 DIAGNOSIS — D688 Other specified coagulation defects: Secondary | ICD-10-CM | POA: Diagnosis not present

## 2021-01-21 DIAGNOSIS — Z992 Dependence on renal dialysis: Secondary | ICD-10-CM | POA: Diagnosis not present

## 2021-01-21 DIAGNOSIS — N2581 Secondary hyperparathyroidism of renal origin: Secondary | ICD-10-CM | POA: Diagnosis not present

## 2021-01-23 DIAGNOSIS — Z992 Dependence on renal dialysis: Secondary | ICD-10-CM | POA: Diagnosis not present

## 2021-01-23 DIAGNOSIS — D688 Other specified coagulation defects: Secondary | ICD-10-CM | POA: Diagnosis not present

## 2021-01-23 DIAGNOSIS — N186 End stage renal disease: Secondary | ICD-10-CM | POA: Diagnosis not present

## 2021-01-23 DIAGNOSIS — N2581 Secondary hyperparathyroidism of renal origin: Secondary | ICD-10-CM | POA: Diagnosis not present

## 2021-01-25 DIAGNOSIS — D688 Other specified coagulation defects: Secondary | ICD-10-CM | POA: Diagnosis not present

## 2021-01-25 DIAGNOSIS — Z992 Dependence on renal dialysis: Secondary | ICD-10-CM | POA: Diagnosis not present

## 2021-01-25 DIAGNOSIS — N2581 Secondary hyperparathyroidism of renal origin: Secondary | ICD-10-CM | POA: Diagnosis not present

## 2021-01-25 DIAGNOSIS — N186 End stage renal disease: Secondary | ICD-10-CM | POA: Diagnosis not present

## 2021-01-28 DIAGNOSIS — N2581 Secondary hyperparathyroidism of renal origin: Secondary | ICD-10-CM | POA: Diagnosis not present

## 2021-01-28 DIAGNOSIS — Z992 Dependence on renal dialysis: Secondary | ICD-10-CM | POA: Diagnosis not present

## 2021-01-28 DIAGNOSIS — N186 End stage renal disease: Secondary | ICD-10-CM | POA: Diagnosis not present

## 2021-01-28 DIAGNOSIS — D688 Other specified coagulation defects: Secondary | ICD-10-CM | POA: Diagnosis not present

## 2021-01-30 DIAGNOSIS — N186 End stage renal disease: Secondary | ICD-10-CM | POA: Diagnosis not present

## 2021-01-30 DIAGNOSIS — N2581 Secondary hyperparathyroidism of renal origin: Secondary | ICD-10-CM | POA: Diagnosis not present

## 2021-01-30 DIAGNOSIS — D688 Other specified coagulation defects: Secondary | ICD-10-CM | POA: Diagnosis not present

## 2021-01-30 DIAGNOSIS — Z992 Dependence on renal dialysis: Secondary | ICD-10-CM | POA: Diagnosis not present

## 2021-02-01 DIAGNOSIS — N2581 Secondary hyperparathyroidism of renal origin: Secondary | ICD-10-CM | POA: Diagnosis not present

## 2021-02-01 DIAGNOSIS — Z992 Dependence on renal dialysis: Secondary | ICD-10-CM | POA: Diagnosis not present

## 2021-02-01 DIAGNOSIS — N186 End stage renal disease: Secondary | ICD-10-CM | POA: Diagnosis not present

## 2021-02-01 DIAGNOSIS — D688 Other specified coagulation defects: Secondary | ICD-10-CM | POA: Diagnosis not present

## 2021-02-04 DIAGNOSIS — D688 Other specified coagulation defects: Secondary | ICD-10-CM | POA: Diagnosis not present

## 2021-02-04 DIAGNOSIS — N2581 Secondary hyperparathyroidism of renal origin: Secondary | ICD-10-CM | POA: Diagnosis not present

## 2021-02-04 DIAGNOSIS — Z992 Dependence on renal dialysis: Secondary | ICD-10-CM | POA: Diagnosis not present

## 2021-02-04 DIAGNOSIS — N186 End stage renal disease: Secondary | ICD-10-CM | POA: Diagnosis not present

## 2021-02-06 DIAGNOSIS — D688 Other specified coagulation defects: Secondary | ICD-10-CM | POA: Diagnosis not present

## 2021-02-06 DIAGNOSIS — N186 End stage renal disease: Secondary | ICD-10-CM | POA: Diagnosis not present

## 2021-02-06 DIAGNOSIS — Z992 Dependence on renal dialysis: Secondary | ICD-10-CM | POA: Diagnosis not present

## 2021-02-06 DIAGNOSIS — N2581 Secondary hyperparathyroidism of renal origin: Secondary | ICD-10-CM | POA: Diagnosis not present

## 2021-02-07 DIAGNOSIS — T8610 Unspecified complication of kidney transplant: Secondary | ICD-10-CM | POA: Diagnosis not present

## 2021-02-07 DIAGNOSIS — Z992 Dependence on renal dialysis: Secondary | ICD-10-CM | POA: Diagnosis not present

## 2021-02-07 DIAGNOSIS — N186 End stage renal disease: Secondary | ICD-10-CM | POA: Diagnosis not present

## 2021-02-08 DIAGNOSIS — N2581 Secondary hyperparathyroidism of renal origin: Secondary | ICD-10-CM | POA: Diagnosis not present

## 2021-02-08 DIAGNOSIS — D688 Other specified coagulation defects: Secondary | ICD-10-CM | POA: Diagnosis not present

## 2021-02-08 DIAGNOSIS — N186 End stage renal disease: Secondary | ICD-10-CM | POA: Diagnosis not present

## 2021-02-08 DIAGNOSIS — Z992 Dependence on renal dialysis: Secondary | ICD-10-CM | POA: Diagnosis not present

## 2021-02-11 DIAGNOSIS — N186 End stage renal disease: Secondary | ICD-10-CM | POA: Diagnosis not present

## 2021-02-11 DIAGNOSIS — N2581 Secondary hyperparathyroidism of renal origin: Secondary | ICD-10-CM | POA: Diagnosis not present

## 2021-02-11 DIAGNOSIS — Z992 Dependence on renal dialysis: Secondary | ICD-10-CM | POA: Diagnosis not present

## 2021-02-11 DIAGNOSIS — D688 Other specified coagulation defects: Secondary | ICD-10-CM | POA: Diagnosis not present

## 2021-02-13 DIAGNOSIS — N186 End stage renal disease: Secondary | ICD-10-CM | POA: Diagnosis not present

## 2021-02-13 DIAGNOSIS — N2581 Secondary hyperparathyroidism of renal origin: Secondary | ICD-10-CM | POA: Diagnosis not present

## 2021-02-13 DIAGNOSIS — D688 Other specified coagulation defects: Secondary | ICD-10-CM | POA: Diagnosis not present

## 2021-02-13 DIAGNOSIS — Z992 Dependence on renal dialysis: Secondary | ICD-10-CM | POA: Diagnosis not present

## 2021-02-15 DIAGNOSIS — N186 End stage renal disease: Secondary | ICD-10-CM | POA: Diagnosis not present

## 2021-02-15 DIAGNOSIS — Z992 Dependence on renal dialysis: Secondary | ICD-10-CM | POA: Diagnosis not present

## 2021-02-15 DIAGNOSIS — N2581 Secondary hyperparathyroidism of renal origin: Secondary | ICD-10-CM | POA: Diagnosis not present

## 2021-02-15 DIAGNOSIS — D688 Other specified coagulation defects: Secondary | ICD-10-CM | POA: Diagnosis not present

## 2021-02-18 DIAGNOSIS — N186 End stage renal disease: Secondary | ICD-10-CM | POA: Diagnosis not present

## 2021-02-18 DIAGNOSIS — N2581 Secondary hyperparathyroidism of renal origin: Secondary | ICD-10-CM | POA: Diagnosis not present

## 2021-02-18 DIAGNOSIS — D688 Other specified coagulation defects: Secondary | ICD-10-CM | POA: Diagnosis not present

## 2021-02-18 DIAGNOSIS — Z992 Dependence on renal dialysis: Secondary | ICD-10-CM | POA: Diagnosis not present

## 2021-02-20 DIAGNOSIS — N186 End stage renal disease: Secondary | ICD-10-CM | POA: Diagnosis not present

## 2021-02-20 DIAGNOSIS — D688 Other specified coagulation defects: Secondary | ICD-10-CM | POA: Diagnosis not present

## 2021-02-20 DIAGNOSIS — Z992 Dependence on renal dialysis: Secondary | ICD-10-CM | POA: Diagnosis not present

## 2021-02-20 DIAGNOSIS — N2581 Secondary hyperparathyroidism of renal origin: Secondary | ICD-10-CM | POA: Diagnosis not present

## 2021-02-22 DIAGNOSIS — N2581 Secondary hyperparathyroidism of renal origin: Secondary | ICD-10-CM | POA: Diagnosis not present

## 2021-02-22 DIAGNOSIS — N186 End stage renal disease: Secondary | ICD-10-CM | POA: Diagnosis not present

## 2021-02-22 DIAGNOSIS — Z992 Dependence on renal dialysis: Secondary | ICD-10-CM | POA: Diagnosis not present

## 2021-02-22 DIAGNOSIS — D688 Other specified coagulation defects: Secondary | ICD-10-CM | POA: Diagnosis not present

## 2021-02-25 DIAGNOSIS — Z992 Dependence on renal dialysis: Secondary | ICD-10-CM | POA: Diagnosis not present

## 2021-02-25 DIAGNOSIS — N186 End stage renal disease: Secondary | ICD-10-CM | POA: Diagnosis not present

## 2021-02-25 DIAGNOSIS — N2581 Secondary hyperparathyroidism of renal origin: Secondary | ICD-10-CM | POA: Diagnosis not present

## 2021-02-25 DIAGNOSIS — D688 Other specified coagulation defects: Secondary | ICD-10-CM | POA: Diagnosis not present

## 2021-02-27 DIAGNOSIS — N186 End stage renal disease: Secondary | ICD-10-CM | POA: Diagnosis not present

## 2021-02-27 DIAGNOSIS — N2581 Secondary hyperparathyroidism of renal origin: Secondary | ICD-10-CM | POA: Diagnosis not present

## 2021-02-27 DIAGNOSIS — D688 Other specified coagulation defects: Secondary | ICD-10-CM | POA: Diagnosis not present

## 2021-02-27 DIAGNOSIS — Z992 Dependence on renal dialysis: Secondary | ICD-10-CM | POA: Diagnosis not present

## 2021-03-01 DIAGNOSIS — N186 End stage renal disease: Secondary | ICD-10-CM | POA: Diagnosis not present

## 2021-03-01 DIAGNOSIS — Z992 Dependence on renal dialysis: Secondary | ICD-10-CM | POA: Diagnosis not present

## 2021-03-01 DIAGNOSIS — D688 Other specified coagulation defects: Secondary | ICD-10-CM | POA: Diagnosis not present

## 2021-03-01 DIAGNOSIS — N2581 Secondary hyperparathyroidism of renal origin: Secondary | ICD-10-CM | POA: Diagnosis not present

## 2021-03-04 DIAGNOSIS — N186 End stage renal disease: Secondary | ICD-10-CM | POA: Diagnosis not present

## 2021-03-04 DIAGNOSIS — D688 Other specified coagulation defects: Secondary | ICD-10-CM | POA: Diagnosis not present

## 2021-03-04 DIAGNOSIS — N2581 Secondary hyperparathyroidism of renal origin: Secondary | ICD-10-CM | POA: Diagnosis not present

## 2021-03-04 DIAGNOSIS — Z992 Dependence on renal dialysis: Secondary | ICD-10-CM | POA: Diagnosis not present

## 2021-03-06 DIAGNOSIS — Z992 Dependence on renal dialysis: Secondary | ICD-10-CM | POA: Diagnosis not present

## 2021-03-06 DIAGNOSIS — N186 End stage renal disease: Secondary | ICD-10-CM | POA: Diagnosis not present

## 2021-03-06 DIAGNOSIS — D688 Other specified coagulation defects: Secondary | ICD-10-CM | POA: Diagnosis not present

## 2021-03-06 DIAGNOSIS — N2581 Secondary hyperparathyroidism of renal origin: Secondary | ICD-10-CM | POA: Diagnosis not present

## 2021-03-08 DIAGNOSIS — N2581 Secondary hyperparathyroidism of renal origin: Secondary | ICD-10-CM | POA: Diagnosis not present

## 2021-03-08 DIAGNOSIS — D688 Other specified coagulation defects: Secondary | ICD-10-CM | POA: Diagnosis not present

## 2021-03-08 DIAGNOSIS — N186 End stage renal disease: Secondary | ICD-10-CM | POA: Diagnosis not present

## 2021-03-08 DIAGNOSIS — Z992 Dependence on renal dialysis: Secondary | ICD-10-CM | POA: Diagnosis not present

## 2021-03-10 DIAGNOSIS — T8610 Unspecified complication of kidney transplant: Secondary | ICD-10-CM | POA: Diagnosis not present

## 2021-03-10 DIAGNOSIS — N186 End stage renal disease: Secondary | ICD-10-CM | POA: Diagnosis not present

## 2021-03-10 DIAGNOSIS — Z992 Dependence on renal dialysis: Secondary | ICD-10-CM | POA: Diagnosis not present

## 2021-03-11 DIAGNOSIS — Z992 Dependence on renal dialysis: Secondary | ICD-10-CM | POA: Diagnosis not present

## 2021-03-11 DIAGNOSIS — N186 End stage renal disease: Secondary | ICD-10-CM | POA: Diagnosis not present

## 2021-03-11 DIAGNOSIS — D509 Iron deficiency anemia, unspecified: Secondary | ICD-10-CM | POA: Diagnosis not present

## 2021-03-11 DIAGNOSIS — N2581 Secondary hyperparathyroidism of renal origin: Secondary | ICD-10-CM | POA: Diagnosis not present

## 2021-03-11 DIAGNOSIS — D688 Other specified coagulation defects: Secondary | ICD-10-CM | POA: Diagnosis not present

## 2021-03-12 ENCOUNTER — Encounter: Payer: Self-pay | Admitting: Internal Medicine

## 2021-03-12 ENCOUNTER — Other Ambulatory Visit: Payer: Self-pay

## 2021-03-12 ENCOUNTER — Ambulatory Visit (INDEPENDENT_AMBULATORY_CARE_PROVIDER_SITE_OTHER): Payer: Medicare Other | Admitting: Internal Medicine

## 2021-03-12 VITALS — BP 128/76 | HR 66 | Temp 98.3°F | Ht 70.0 in | Wt 175.0 lb

## 2021-03-12 DIAGNOSIS — N186 End stage renal disease: Secondary | ICD-10-CM

## 2021-03-12 DIAGNOSIS — I10 Essential (primary) hypertension: Secondary | ICD-10-CM

## 2021-03-12 DIAGNOSIS — I739 Peripheral vascular disease, unspecified: Secondary | ICD-10-CM | POA: Diagnosis not present

## 2021-03-12 DIAGNOSIS — E118 Type 2 diabetes mellitus with unspecified complications: Secondary | ICD-10-CM

## 2021-03-12 DIAGNOSIS — D539 Nutritional anemia, unspecified: Secondary | ICD-10-CM | POA: Insufficient documentation

## 2021-03-12 DIAGNOSIS — Z125 Encounter for screening for malignant neoplasm of prostate: Secondary | ICD-10-CM | POA: Diagnosis not present

## 2021-03-12 DIAGNOSIS — Z23 Encounter for immunization: Secondary | ICD-10-CM

## 2021-03-12 LAB — CBC WITH DIFFERENTIAL/PLATELET
Basophils Absolute: 0 10*3/uL (ref 0.0–0.1)
Basophils Relative: 0.6 % (ref 0.0–3.0)
Eosinophils Absolute: 0.1 10*3/uL (ref 0.0–0.7)
Eosinophils Relative: 2.5 % (ref 0.0–5.0)
HCT: 33.9 % — ABNORMAL LOW (ref 39.0–52.0)
Hemoglobin: 11.3 g/dL — ABNORMAL LOW (ref 13.0–17.0)
Lymphocytes Relative: 37.9 % (ref 12.0–46.0)
Lymphs Abs: 1.6 10*3/uL (ref 0.7–4.0)
MCHC: 33.3 g/dL (ref 30.0–36.0)
MCV: 84.2 fl (ref 78.0–100.0)
Monocytes Absolute: 0.4 10*3/uL (ref 0.1–1.0)
Monocytes Relative: 9.9 % (ref 3.0–12.0)
Neutro Abs: 2.1 10*3/uL (ref 1.4–7.7)
Neutrophils Relative %: 49.1 % (ref 43.0–77.0)
Platelets: 151 10*3/uL (ref 150.0–400.0)
RBC: 4.02 Mil/uL — ABNORMAL LOW (ref 4.22–5.81)
RDW: 15.5 % (ref 11.5–15.5)
WBC: 4.3 10*3/uL (ref 4.0–10.5)

## 2021-03-12 LAB — FOLATE: Folate: 12.3 ng/mL (ref >5.9)

## 2021-03-12 LAB — TSH: TSH: 1.99 u[IU]/mL (ref 0.35–5.50)

## 2021-03-12 LAB — FERRITIN: Ferritin: 380.1 ng/mL — ABNORMAL HIGH (ref 22.0–322.0)

## 2021-03-12 LAB — VITAMIN B12: Vitamin B-12: 1051 pg/mL — ABNORMAL HIGH (ref 211–911)

## 2021-03-12 LAB — PSA: PSA: 1.07 ng/mL (ref 0.10–4.00)

## 2021-03-12 LAB — IRON: Iron: 93 ug/dL (ref 42–165)

## 2021-03-12 LAB — HEMOGLOBIN A1C: Hgb A1c MFr Bld: 5.8 % (ref 4.6–6.5)

## 2021-03-12 NOTE — Patient Instructions (Signed)

## 2021-03-12 NOTE — Progress Notes (Signed)
Subjective:  Patient ID: [name], male    DOB: 1951/05/05  Age: 70 y.o. MRN: [id number]  CC: Anemia and Diabetes  This visit occurred during the SARS-CoV-2 public health emergency.  Safety protocols were in place, including screening questions prior to the visit, additional usage of staff PPE, and extensive cleaning of exam room while observing appropriate contact time as indicated for disinfecting solutions.    HPI [name] presents for f/up -  He is no longer using insulin. He tells me his blood sugars are well controlled.  He denies polys.  He is active and denies any recent episodes of chest pain, shortness of breath, diaphoresis, edema, dizziness, or lightheadedness.  He complains of lower extremity discomfort with walking.  Outpatient Medications Prior to Visit  Medication Sig Dispense Refill   ACCU-CHEK SOFTCLIX LANCETS lancets 1 each by Other route 2 (two) times daily. Use to check blood sugars twice a day 100 each 3   acetaminophen (TYLENOL) 500 MG tablet Take 1,000 mg by mouth.     amLODipine (NORVASC) 5 MG tablet Take 5 mg by mouth daily.     atorvastatin (LIPITOR) 10 MG tablet Take 10 mg by mouth daily.  7   Blood Glucose Monitoring Suppl (ACCU-CHEK AVIVA PLUS) w/Device KIT Use as directed to check blood sugars 1 kit 0   calcitRIOL (ROCALTROL) 0.5 MCG capsule Take 0.5 mcg by mouth daily.  11   carvedilol (COREG) 25 MG tablet Take 25 mg by mouth 2 (two) times daily with a meal.     Cholecalciferol (VITAMIN D3 PO) Take 1 tablet by mouth daily.     ciclopirox (PENLAC) 8 % solution Apply topically at bedtime. Apply over nail and surrounding skin. Apply daily over previous coat. After seven (7) days, may remove with alcohol and continue cycle. 6.6 mL 2   glucose blood test strip Use TID 100 each 12   magnesium oxide (MAG-OX) 400 MG tablet Take 400 mg by mouth daily.     mycophenolate (MYFORTIC) 180 MG EC tablet Take 360 mg by mouth 2 (two) times daily.      tacrolimus (PROGRAF) 1 MG capsule Take 5 mg by mouth 2 (two) times daily. Take 69m in the morning and 664min the evening     DM-Doxylamine-Acetaminophen (NYQUIL COLD & FLU PO) Take 30 mLs by mouth at bedtime.     HUMALOG KWIKPEN 100 UNIT/ML KwikPen INJECT 15 UNITS SUBCUTANEOUSLY BEFORE MEAL(S) (Patient taking differently: Inject 15 Units into the skin See admin instructions. Takes before meals if his BG is above 135.) 3 mL 0   Insulin Degludec (TRESIBA FLEXTOUCH) 200 UNIT/ML SOPN Inject 32 Units into the skin daily. (Patient taking differently: Inject 32 Units into the skin at bedtime.) 9 pen 3   Insulin Pen Needle (NOVOFINE) 32G X 6 MM MISC 1 Act by Does not apply route 4 (four) times daily. 100 each 11   No facility-administered medications prior to visit.    ROS Review of Systems  Constitutional:  Negative for chills, diaphoresis, fatigue and fever.  HENT: Negative.    Eyes: Negative.   Respiratory:  Negative for cough, chest tightness, shortness of breath and wheezing.   Cardiovascular:  Negative for chest pain, palpitations and leg swelling.  Gastrointestinal:  Negative for abdominal pain, constipation, diarrhea and vomiting.  Endocrine: Negative.   Genitourinary: Negative.  Negative for difficulty urinating, dysuria, testicular pain and urgency.  Musculoskeletal: Negative.  Negative for arthralgias and myalgias.  Skin:  Negative for color change and pallor.  Neurological:  Negative for dizziness, weakness, light-headedness and numbness.  Hematological:  Negative for adenopathy. Does not bruise/bleed easily.  Psychiatric/Behavioral: Negative.     Objective:  BP 128/76 (BP Location: Left Arm, Patient Position: Sitting, Cuff Size: Large)   Pulse 66   Temp 98.3 F (36.8 C) (Oral)   Ht '5\' 10"'  (1.778 m)   Wt 175 lb (79.4 kg)   SpO2 98%   BMI 25.11 kg/m   BP Readings from Last 3 Encounters:  03/12/21 128/76  09/16/20 (!) 189/92  09/15/20 (!) 168/81    Wt Readings from Last 3  Encounters:  03/12/21 175 lb (79.4 kg)  09/16/20 207 lb 3.7 oz (94 kg)  04/18/20 201 lb (91.2 kg)    Physical Exam Vitals reviewed.  HENT:     Nose: Nose normal.     Mouth/Throat:     Mouth: Mucous membranes are moist.  Eyes:     General: No scleral icterus.    Conjunctiva/sclera: Conjunctivae normal.  Cardiovascular:     Rate and Rhythm: Normal rate and regular rhythm.     Heart sounds: No murmur heard. Pulmonary:     Effort: Pulmonary effort is normal.     Breath sounds: No stridor. No wheezing, rhonchi or rales.  Abdominal:     General: Abdomen is flat.     Palpations: There is no mass.     Tenderness: There is no abdominal tenderness. There is no guarding.     Hernia: No hernia is present.  Musculoskeletal:        General: Normal range of motion.     Cervical back: Neck supple.     Right lower leg: No edema.     Left lower leg: No edema.  Lymphadenopathy:     Cervical: No cervical adenopathy.  Skin:    General: Skin is warm and dry.  Neurological:     General: No focal deficit present.     Mental Status: He is alert.  Psychiatric:        Mood and Affect: Mood normal.        Behavior: Behavior normal.    Lab Results  Component Value Date   WBC 4.3 03/12/2021   HGB 11.3 (L) 03/12/2021   HCT 33.9 (L) 03/12/2021   PLT 151.0 03/12/2021   GLUCOSE 182 (H) 09/15/2020   CHOL 148 03/07/2020   TRIG 81 03/07/2020   HDL 51 03/07/2020   LDLDIRECT 75.0 05/18/2017   LDLCALC 92 03/07/2020   ALT 10 04/03/2020   AST 10 (A) 04/03/2020   NA 139 09/15/2020   K 4.3 09/15/2020   CL 105 09/15/2020   CREATININE 10.33 (H) 09/15/2020   BUN 89 (H) 09/15/2020   CO2 21 (L) 09/15/2020   TSH 1.99 03/12/2021   PSA 1.07 03/12/2021   INR 1.08 06/28/2014   HGBA1C 5.8 03/12/2021    DG Chest 2 View  Result Date: 09/15/2020 CLINICAL DATA:  Productive cough for 3 weeks. EXAM: CHEST - 2 VIEW COMPARISON:  Two-view chest x-ray 05/07/2017 FINDINGS: Heart size is upper limits of normal.  Perihilar fullness is present bilaterally. Patchy airspace disease is present at the left greater than right base. Bilateral effusions are present. Right suprahilar airspace opacities are noted as well. IMPRESSION: 1. Patchy bibasilar airspace disease, left greater than right, concerning for pneumonia. 2. Right upper lobe airspace disease is also concerning for pneumonia. 3. Bilateral pleural effusions. Electronically Signed   By: San Morelle  M.D.   On: 09/15/2020 10:54   ECHOCARDIOGRAM COMPLETE  Result Date: 09/16/2020    ECHOCARDIOGRAM REPORT   Patient Name:   [name] Date of Exam: 09/16/2020 Medical Rec #:  [id number]         Height:       70.0 in Accession #:    [id number]        Weight:       207.2 lb Date of Birth:  05-Oct-1950         BSA:          2.119 m Patient Age:    72 years          BP:           171/86 mmHg Patient Gender: M                 HR:           71 bpm. Exam Location:  Inpatient Procedure: 2D Echo, Color Doppler and Cardiac Doppler Indications:    R07.9* Chest pain, unspecified  History:        Patient has prior history of Echocardiogram examinations. H/O                 kidney transplant.  Sonographer:    Merrie Roof RDCS Referring Phys: [id number] [place]  1. Left ventricular ejection fraction, by estimation, is 60 to 65%. The left ventricle has normal function. The left ventricle has no regional wall motion abnormalities. There is severe concentric left ventricular hypertrophy. Left ventricular diastolic  parameters are consistent with Grade II diastolic dysfunction (pseudonormalization). Elevated left atrial pressure.  2. Right ventricular systolic function is normal. The right ventricular size is normal. There is mildly elevated pulmonary artery systolic pressure. The estimated right ventricular systolic pressure is 56.3 mmHg.  3. Left atrial size was moderately dilated.  4. Right atrial size was moderately dilated.  5. The mitral valve is normal in  structure. Mild to moderate mitral valve regurgitation. No evidence of mitral stenosis.  6. The aortic valve is normal in structure. There is mild calcification of the aortic valve. There is moderate thickening of the aortic valve. Aortic valve regurgitation is mild. Mild to moderate aortic valve sclerosis/calcification is present, without any evidence of aortic stenosis.  7. The inferior vena cava is normal in size with <50% respiratory variability, suggesting right atrial pressure of 8 mmHg. FINDINGS  Left Ventricle: Left ventricular ejection fraction, by estimation, is 60 to 65%. The left ventricle has normal function. The left ventricle has no regional wall motion abnormalities. The left ventricular internal cavity size was normal in size. There is  severe concentric left ventricular hypertrophy. Left ventricular diastolic parameters are consistent with Grade II diastolic dysfunction (pseudonormalization). Elevated left atrial pressure. Right Ventricle: The right ventricular size is normal. No increase in right ventricular wall thickness. Right ventricular systolic function is normal. There is mildly elevated pulmonary artery systolic pressure. The tricuspid regurgitant velocity is 3.03  m/s, and with an assumed right atrial pressure of 8 mmHg, the estimated right ventricular systolic pressure is 89.3 mmHg. Left Atrium: Left atrial size was moderately dilated. Right Atrium: Right atrial size was moderately dilated. Pericardium: Trivial pericardial effusion is present. Mitral Valve: The mitral valve is normal in structure. Mild to moderate mitral valve regurgitation. No evidence of mitral valve stenosis. Tricuspid Valve: The tricuspid valve is normal in structure. Tricuspid valve regurgitation is mild . No evidence of tricuspid stenosis. Aortic Valve:  The aortic valve is normal in structure. There is mild calcification of the aortic valve. There is moderate thickening of the aortic valve. Aortic valve  regurgitation is mild. Mild to moderate aortic valve sclerosis/calcification is present, without any evidence of aortic stenosis. Aortic valve mean gradient measures 10.0 mmHg. Aortic valve peak gradient measures 20.2 mmHg. Aortic valve area, by VTI measures 1.87 cm. Pulmonic Valve: The pulmonic valve was normal in structure. Pulmonic valve regurgitation is not visualized. No evidence of pulmonic stenosis. Aorta: The aortic root is normal in size and structure. Venous: The inferior vena cava is normal in size with less than 50% respiratory variability, suggesting right atrial pressure of 8 mmHg. IAS/Shunts: No atrial level shunt detected by color flow Doppler.  LEFT VENTRICLE PLAX 2D LVIDd:         5.20 cm      Diastology LVIDs:         3.50 cm      LV e' lateral:   7.07 cm/s LV PW:         1.30 cm      LV E/e' lateral: 18.2 LV IVS:        1.30 cm LVOT diam:     2.00 cm LV SV:         93 LV SV Index:   44 LVOT Area:     3.14 cm  LV Volumes (MOD) LV vol d, MOD A4C: 106.0 ml LV vol s, MOD A4C: 33.3 ml LV SV MOD A4C:     106.0 ml RIGHT VENTRICLE          IVC RV Basal diam:  5.00 cm  IVC diam: 2.20 cm LEFT ATRIUM            Index       RIGHT ATRIUM           Index LA diam:      4.40 cm  2.08 cm/m  RA Area:     23.90 cm LA Vol (A2C): 125.0 ml 58.99 ml/m RA Volume:   79.00 ml  37.28 ml/m LA Vol (A4C): 97.9 ml  46.20 ml/m  AORTIC VALVE AV Area (Vmax):    1.88 cm AV Area (Vmean):   1.90 cm AV Area (VTI):     1.87 cm AV Vmax:           225.00 cm/s AV Vmean:          152.000 cm/s AV VTI:            0.497 m AV Peak Grad:      20.2 mmHg AV Mean Grad:      10.0 mmHg LVOT Vmax:         135.00 cm/s LVOT Vmean:        91.800 cm/s LVOT VTI:          0.296 m LVOT/AV VTI ratio: 0.60  AORTA Ao Root diam: 3.20 cm MITRAL VALVE                TRICUSPID VALVE MV Area (PHT): 3.42 cm     TR Peak grad:   36.7 mmHg MV Decel Time: 222 msec     TR Vmax:        303.00 cm/s MV E velocity: 129.00 cm/s MV A velocity: 111.00 cm/s  SHUNTS  MV E/A ratio:  1.16         Systemic VTI:  0.30 m  Systemic Diam: 2.00 cm Ena Dawley MD Electronically signed by Ena Dawley MD Signature Date/Time: 09/16/2020/2:51:58 PM    Final     Assessment & Plan:   [name] was seen today for anemia and diabetes.  Diagnoses and all orders for this visit:  Need for vaccination -     Pneumococcal conjugate vaccine 20-valent (Prevnar 20)  Primary hypertension- His blood pressure is adequately well controlled. -     CBC with Differential/Platelet; Future -     TSH; Future -     TSH -     CBC with Differential/Platelet  Type 2 diabetes mellitus with complication, without long-term current use of insulin ([place])- His blood sugar is well controlled.  Insulin therapy is not not indicated. -     Hemoglobin A1c; Future -     HM Diabetes Foot Exam -     Hemoglobin A1c  Deficiency anemia- This is likely related to the renal disease.  Will screen for vitamin deficiencies. -     CBC with Differential/Platelet; Future -     Iron; Future -     Ferritin; Future -     Folate; Future -     Vitamin B12; Future -     Vitamin B1; Future -     Vitamin B1 -     Vitamin B12 -     Folate -     Ferritin -     Iron -     CBC with Differential/Platelet  Prostate cancer screening -     PSA; Future -     PSA  Claudication of both lower extremities (HCC) -     VAS [place] ABI WITH/WO TBI; Future  End stage renal disease ([place])  I have discontinued Aden E. Milich's Insulin Pen Needle, insulin degludec, HumaLOG KwikPen, and DM-Doxylamine-Acetaminophen (NYQUIL COLD & FLU PO). I am also having him maintain his carvedilol, magnesium oxide, mycophenolate, tacrolimus, amLODipine, acetaminophen, atorvastatin, calcitRIOL, glucose blood, Accu-Chek Aviva Plus, Accu-Chek Softclix Lancets, ciclopirox, and Cholecalciferol (VITAMIN D3 PO).  No orders of the defined types were placed in this encounter.    Follow-up: Return in about 3 months  (around 06/12/2021).  Scarlette Calico, MD

## 2021-03-13 DIAGNOSIS — Z992 Dependence on renal dialysis: Secondary | ICD-10-CM | POA: Diagnosis not present

## 2021-03-13 DIAGNOSIS — N2581 Secondary hyperparathyroidism of renal origin: Secondary | ICD-10-CM | POA: Diagnosis not present

## 2021-03-13 DIAGNOSIS — D688 Other specified coagulation defects: Secondary | ICD-10-CM | POA: Diagnosis not present

## 2021-03-13 DIAGNOSIS — D509 Iron deficiency anemia, unspecified: Secondary | ICD-10-CM | POA: Diagnosis not present

## 2021-03-13 DIAGNOSIS — N186 End stage renal disease: Secondary | ICD-10-CM | POA: Diagnosis not present

## 2021-03-15 DIAGNOSIS — D509 Iron deficiency anemia, unspecified: Secondary | ICD-10-CM | POA: Diagnosis not present

## 2021-03-15 DIAGNOSIS — N2581 Secondary hyperparathyroidism of renal origin: Secondary | ICD-10-CM | POA: Diagnosis not present

## 2021-03-15 DIAGNOSIS — D688 Other specified coagulation defects: Secondary | ICD-10-CM | POA: Diagnosis not present

## 2021-03-15 DIAGNOSIS — Z992 Dependence on renal dialysis: Secondary | ICD-10-CM | POA: Diagnosis not present

## 2021-03-15 DIAGNOSIS — N186 End stage renal disease: Secondary | ICD-10-CM | POA: Diagnosis not present

## 2021-03-18 ENCOUNTER — Ambulatory Visit (HOSPITAL_COMMUNITY)
Admission: RE | Admit: 2021-03-18 | Discharge: 2021-03-18 | Disposition: A | Discharge status: Home / Self Care | Payer: Medicare Other | Source: Ambulatory Visit | Attending: Internal Medicine | Admitting: Internal Medicine

## 2021-03-18 ENCOUNTER — Other Ambulatory Visit: Payer: Self-pay

## 2021-03-18 DIAGNOSIS — N186 End stage renal disease: Secondary | ICD-10-CM | POA: Diagnosis not present

## 2021-03-18 DIAGNOSIS — Z992 Dependence on renal dialysis: Secondary | ICD-10-CM | POA: Diagnosis not present

## 2021-03-18 DIAGNOSIS — I739 Peripheral vascular disease, unspecified: Secondary | ICD-10-CM | POA: Diagnosis not present

## 2021-03-18 DIAGNOSIS — N2581 Secondary hyperparathyroidism of renal origin: Secondary | ICD-10-CM | POA: Diagnosis not present

## 2021-03-18 DIAGNOSIS — D688 Other specified coagulation defects: Secondary | ICD-10-CM | POA: Diagnosis not present

## 2021-03-18 DIAGNOSIS — D509 Iron deficiency anemia, unspecified: Secondary | ICD-10-CM | POA: Diagnosis not present

## 2021-03-20 DIAGNOSIS — D688 Other specified coagulation defects: Secondary | ICD-10-CM | POA: Diagnosis not present

## 2021-03-20 DIAGNOSIS — N2581 Secondary hyperparathyroidism of renal origin: Secondary | ICD-10-CM | POA: Diagnosis not present

## 2021-03-20 DIAGNOSIS — D509 Iron deficiency anemia, unspecified: Secondary | ICD-10-CM | POA: Diagnosis not present

## 2021-03-20 DIAGNOSIS — Z992 Dependence on renal dialysis: Secondary | ICD-10-CM | POA: Diagnosis not present

## 2021-03-20 DIAGNOSIS — N186 End stage renal disease: Secondary | ICD-10-CM | POA: Diagnosis not present

## 2021-03-22 DIAGNOSIS — Z992 Dependence on renal dialysis: Secondary | ICD-10-CM | POA: Diagnosis not present

## 2021-03-22 DIAGNOSIS — D509 Iron deficiency anemia, unspecified: Secondary | ICD-10-CM | POA: Diagnosis not present

## 2021-03-22 DIAGNOSIS — N186 End stage renal disease: Secondary | ICD-10-CM | POA: Diagnosis not present

## 2021-03-22 DIAGNOSIS — N2581 Secondary hyperparathyroidism of renal origin: Secondary | ICD-10-CM | POA: Diagnosis not present

## 2021-03-22 DIAGNOSIS — D688 Other specified coagulation defects: Secondary | ICD-10-CM | POA: Diagnosis not present

## 2021-03-22 LAB — VITAMIN B1: Vitamin B1 (Thiamine): 41 nmol/L — ABNORMAL HIGH (ref 8–30)

## 2021-03-25 DIAGNOSIS — N186 End stage renal disease: Secondary | ICD-10-CM | POA: Diagnosis not present

## 2021-03-25 DIAGNOSIS — Z992 Dependence on renal dialysis: Secondary | ICD-10-CM | POA: Diagnosis not present

## 2021-03-25 DIAGNOSIS — D688 Other specified coagulation defects: Secondary | ICD-10-CM | POA: Diagnosis not present

## 2021-03-25 DIAGNOSIS — D509 Iron deficiency anemia, unspecified: Secondary | ICD-10-CM | POA: Diagnosis not present

## 2021-03-25 DIAGNOSIS — N2581 Secondary hyperparathyroidism of renal origin: Secondary | ICD-10-CM | POA: Diagnosis not present

## 2021-03-27 DIAGNOSIS — N2581 Secondary hyperparathyroidism of renal origin: Secondary | ICD-10-CM | POA: Diagnosis not present

## 2021-03-27 DIAGNOSIS — Z992 Dependence on renal dialysis: Secondary | ICD-10-CM | POA: Diagnosis not present

## 2021-03-27 DIAGNOSIS — D688 Other specified coagulation defects: Secondary | ICD-10-CM | POA: Diagnosis not present

## 2021-03-27 DIAGNOSIS — D509 Iron deficiency anemia, unspecified: Secondary | ICD-10-CM | POA: Diagnosis not present

## 2021-03-27 DIAGNOSIS — N186 End stage renal disease: Secondary | ICD-10-CM | POA: Diagnosis not present

## 2021-03-29 DIAGNOSIS — D509 Iron deficiency anemia, unspecified: Secondary | ICD-10-CM | POA: Diagnosis not present

## 2021-03-29 DIAGNOSIS — Z992 Dependence on renal dialysis: Secondary | ICD-10-CM | POA: Diagnosis not present

## 2021-03-29 DIAGNOSIS — D688 Other specified coagulation defects: Secondary | ICD-10-CM | POA: Diagnosis not present

## 2021-03-29 DIAGNOSIS — N2581 Secondary hyperparathyroidism of renal origin: Secondary | ICD-10-CM | POA: Diagnosis not present

## 2021-03-29 DIAGNOSIS — N186 End stage renal disease: Secondary | ICD-10-CM | POA: Diagnosis not present

## 2021-04-01 DIAGNOSIS — D688 Other specified coagulation defects: Secondary | ICD-10-CM | POA: Diagnosis not present

## 2021-04-01 DIAGNOSIS — Z992 Dependence on renal dialysis: Secondary | ICD-10-CM | POA: Diagnosis not present

## 2021-04-01 DIAGNOSIS — N2581 Secondary hyperparathyroidism of renal origin: Secondary | ICD-10-CM | POA: Diagnosis not present

## 2021-04-01 DIAGNOSIS — N186 End stage renal disease: Secondary | ICD-10-CM | POA: Diagnosis not present

## 2021-04-01 DIAGNOSIS — D509 Iron deficiency anemia, unspecified: Secondary | ICD-10-CM | POA: Diagnosis not present

## 2021-04-03 DIAGNOSIS — Z992 Dependence on renal dialysis: Secondary | ICD-10-CM | POA: Diagnosis not present

## 2021-04-03 DIAGNOSIS — D688 Other specified coagulation defects: Secondary | ICD-10-CM | POA: Diagnosis not present

## 2021-04-03 DIAGNOSIS — N2581 Secondary hyperparathyroidism of renal origin: Secondary | ICD-10-CM | POA: Diagnosis not present

## 2021-04-03 DIAGNOSIS — D509 Iron deficiency anemia, unspecified: Secondary | ICD-10-CM | POA: Diagnosis not present

## 2021-04-03 DIAGNOSIS — N186 End stage renal disease: Secondary | ICD-10-CM | POA: Diagnosis not present

## 2021-04-05 DIAGNOSIS — D688 Other specified coagulation defects: Secondary | ICD-10-CM | POA: Diagnosis not present

## 2021-04-05 DIAGNOSIS — N2581 Secondary hyperparathyroidism of renal origin: Secondary | ICD-10-CM | POA: Diagnosis not present

## 2021-04-05 DIAGNOSIS — N186 End stage renal disease: Secondary | ICD-10-CM | POA: Diagnosis not present

## 2021-04-05 DIAGNOSIS — D509 Iron deficiency anemia, unspecified: Secondary | ICD-10-CM | POA: Diagnosis not present

## 2021-04-05 DIAGNOSIS — Z992 Dependence on renal dialysis: Secondary | ICD-10-CM | POA: Diagnosis not present

## 2021-04-08 DIAGNOSIS — N2581 Secondary hyperparathyroidism of renal origin: Secondary | ICD-10-CM | POA: Diagnosis not present

## 2021-04-08 DIAGNOSIS — D688 Other specified coagulation defects: Secondary | ICD-10-CM | POA: Diagnosis not present

## 2021-04-08 DIAGNOSIS — Z992 Dependence on renal dialysis: Secondary | ICD-10-CM | POA: Diagnosis not present

## 2021-04-08 DIAGNOSIS — D509 Iron deficiency anemia, unspecified: Secondary | ICD-10-CM | POA: Diagnosis not present

## 2021-04-08 DIAGNOSIS — N186 End stage renal disease: Secondary | ICD-10-CM | POA: Diagnosis not present

## 2021-04-10 DIAGNOSIS — N186 End stage renal disease: Secondary | ICD-10-CM | POA: Diagnosis not present

## 2021-04-10 DIAGNOSIS — D509 Iron deficiency anemia, unspecified: Secondary | ICD-10-CM | POA: Diagnosis not present

## 2021-04-10 DIAGNOSIS — T8610 Unspecified complication of kidney transplant: Secondary | ICD-10-CM | POA: Diagnosis not present

## 2021-04-10 DIAGNOSIS — N2581 Secondary hyperparathyroidism of renal origin: Secondary | ICD-10-CM | POA: Diagnosis not present

## 2021-04-10 DIAGNOSIS — Z992 Dependence on renal dialysis: Secondary | ICD-10-CM | POA: Diagnosis not present

## 2021-04-10 DIAGNOSIS — D688 Other specified coagulation defects: Secondary | ICD-10-CM | POA: Diagnosis not present

## 2021-04-12 DIAGNOSIS — D688 Other specified coagulation defects: Secondary | ICD-10-CM | POA: Diagnosis not present

## 2021-04-12 DIAGNOSIS — N186 End stage renal disease: Secondary | ICD-10-CM | POA: Diagnosis not present

## 2021-04-12 DIAGNOSIS — Z992 Dependence on renal dialysis: Secondary | ICD-10-CM | POA: Diagnosis not present

## 2021-04-12 DIAGNOSIS — N2581 Secondary hyperparathyroidism of renal origin: Secondary | ICD-10-CM | POA: Diagnosis not present

## 2021-04-15 DIAGNOSIS — D688 Other specified coagulation defects: Secondary | ICD-10-CM | POA: Diagnosis not present

## 2021-04-15 DIAGNOSIS — N186 End stage renal disease: Secondary | ICD-10-CM | POA: Diagnosis not present

## 2021-04-15 DIAGNOSIS — N2581 Secondary hyperparathyroidism of renal origin: Secondary | ICD-10-CM | POA: Diagnosis not present

## 2021-04-15 DIAGNOSIS — Z992 Dependence on renal dialysis: Secondary | ICD-10-CM | POA: Diagnosis not present

## 2021-04-17 DIAGNOSIS — D509 Iron deficiency anemia, unspecified: Secondary | ICD-10-CM | POA: Diagnosis not present

## 2021-04-17 DIAGNOSIS — N2581 Secondary hyperparathyroidism of renal origin: Secondary | ICD-10-CM | POA: Diagnosis not present

## 2021-04-17 DIAGNOSIS — D631 Anemia in chronic kidney disease: Secondary | ICD-10-CM | POA: Diagnosis not present

## 2021-04-17 DIAGNOSIS — D688 Other specified coagulation defects: Secondary | ICD-10-CM | POA: Diagnosis not present

## 2021-04-17 DIAGNOSIS — Z992 Dependence on renal dialysis: Secondary | ICD-10-CM | POA: Diagnosis not present

## 2021-04-17 DIAGNOSIS — N186 End stage renal disease: Secondary | ICD-10-CM | POA: Diagnosis not present

## 2021-04-19 DIAGNOSIS — D509 Iron deficiency anemia, unspecified: Secondary | ICD-10-CM | POA: Diagnosis not present

## 2021-04-19 DIAGNOSIS — Z992 Dependence on renal dialysis: Secondary | ICD-10-CM | POA: Diagnosis not present

## 2021-04-19 DIAGNOSIS — N2581 Secondary hyperparathyroidism of renal origin: Secondary | ICD-10-CM | POA: Diagnosis not present

## 2021-04-19 DIAGNOSIS — N186 End stage renal disease: Secondary | ICD-10-CM | POA: Diagnosis not present

## 2021-04-19 DIAGNOSIS — D688 Other specified coagulation defects: Secondary | ICD-10-CM | POA: Diagnosis not present

## 2021-04-19 DIAGNOSIS — D631 Anemia in chronic kidney disease: Secondary | ICD-10-CM | POA: Diagnosis not present

## 2021-04-22 DIAGNOSIS — N186 End stage renal disease: Secondary | ICD-10-CM | POA: Diagnosis not present

## 2021-04-22 DIAGNOSIS — D509 Iron deficiency anemia, unspecified: Secondary | ICD-10-CM | POA: Diagnosis not present

## 2021-04-22 DIAGNOSIS — Z992 Dependence on renal dialysis: Secondary | ICD-10-CM | POA: Diagnosis not present

## 2021-04-22 DIAGNOSIS — N2581 Secondary hyperparathyroidism of renal origin: Secondary | ICD-10-CM | POA: Diagnosis not present

## 2021-04-22 DIAGNOSIS — D631 Anemia in chronic kidney disease: Secondary | ICD-10-CM | POA: Diagnosis not present

## 2021-04-22 DIAGNOSIS — D688 Other specified coagulation defects: Secondary | ICD-10-CM | POA: Diagnosis not present

## 2021-04-24 DIAGNOSIS — D509 Iron deficiency anemia, unspecified: Secondary | ICD-10-CM | POA: Diagnosis not present

## 2021-04-24 DIAGNOSIS — N186 End stage renal disease: Secondary | ICD-10-CM | POA: Diagnosis not present

## 2021-04-24 DIAGNOSIS — D631 Anemia in chronic kidney disease: Secondary | ICD-10-CM | POA: Diagnosis not present

## 2021-04-24 DIAGNOSIS — T82868A Thrombosis of vascular prosthetic devices, implants and grafts, initial encounter: Secondary | ICD-10-CM | POA: Diagnosis not present

## 2021-04-24 DIAGNOSIS — Z992 Dependence on renal dialysis: Secondary | ICD-10-CM | POA: Diagnosis not present

## 2021-04-24 DIAGNOSIS — D688 Other specified coagulation defects: Secondary | ICD-10-CM | POA: Diagnosis not present

## 2021-04-24 DIAGNOSIS — N2581 Secondary hyperparathyroidism of renal origin: Secondary | ICD-10-CM | POA: Diagnosis not present

## 2021-04-26 DIAGNOSIS — Z992 Dependence on renal dialysis: Secondary | ICD-10-CM | POA: Diagnosis not present

## 2021-04-26 DIAGNOSIS — N2581 Secondary hyperparathyroidism of renal origin: Secondary | ICD-10-CM | POA: Diagnosis not present

## 2021-04-26 DIAGNOSIS — D631 Anemia in chronic kidney disease: Secondary | ICD-10-CM | POA: Diagnosis not present

## 2021-04-26 DIAGNOSIS — D688 Other specified coagulation defects: Secondary | ICD-10-CM | POA: Diagnosis not present

## 2021-04-26 DIAGNOSIS — N186 End stage renal disease: Secondary | ICD-10-CM | POA: Diagnosis not present

## 2021-04-26 DIAGNOSIS — D509 Iron deficiency anemia, unspecified: Secondary | ICD-10-CM | POA: Diagnosis not present

## 2021-04-29 DIAGNOSIS — D631 Anemia in chronic kidney disease: Secondary | ICD-10-CM | POA: Diagnosis not present

## 2021-04-29 DIAGNOSIS — N2581 Secondary hyperparathyroidism of renal origin: Secondary | ICD-10-CM | POA: Diagnosis not present

## 2021-04-29 DIAGNOSIS — Z992 Dependence on renal dialysis: Secondary | ICD-10-CM | POA: Diagnosis not present

## 2021-04-29 DIAGNOSIS — N186 End stage renal disease: Secondary | ICD-10-CM | POA: Diagnosis not present

## 2021-04-29 DIAGNOSIS — D688 Other specified coagulation defects: Secondary | ICD-10-CM | POA: Diagnosis not present

## 2021-04-29 DIAGNOSIS — D509 Iron deficiency anemia, unspecified: Secondary | ICD-10-CM | POA: Diagnosis not present

## 2021-05-01 DIAGNOSIS — D631 Anemia in chronic kidney disease: Secondary | ICD-10-CM | POA: Diagnosis not present

## 2021-05-01 DIAGNOSIS — D509 Iron deficiency anemia, unspecified: Secondary | ICD-10-CM | POA: Diagnosis not present

## 2021-05-01 DIAGNOSIS — N2581 Secondary hyperparathyroidism of renal origin: Secondary | ICD-10-CM | POA: Diagnosis not present

## 2021-05-01 DIAGNOSIS — N186 End stage renal disease: Secondary | ICD-10-CM | POA: Diagnosis not present

## 2021-05-01 DIAGNOSIS — Z992 Dependence on renal dialysis: Secondary | ICD-10-CM | POA: Diagnosis not present

## 2021-05-01 DIAGNOSIS — D688 Other specified coagulation defects: Secondary | ICD-10-CM | POA: Diagnosis not present

## 2021-05-03 DIAGNOSIS — N186 End stage renal disease: Secondary | ICD-10-CM | POA: Diagnosis not present

## 2021-05-03 DIAGNOSIS — Z992 Dependence on renal dialysis: Secondary | ICD-10-CM | POA: Diagnosis not present

## 2021-05-03 DIAGNOSIS — N2581 Secondary hyperparathyroidism of renal origin: Secondary | ICD-10-CM | POA: Diagnosis not present

## 2021-05-03 DIAGNOSIS — D688 Other specified coagulation defects: Secondary | ICD-10-CM | POA: Diagnosis not present

## 2021-05-03 DIAGNOSIS — D631 Anemia in chronic kidney disease: Secondary | ICD-10-CM | POA: Diagnosis not present

## 2021-05-03 DIAGNOSIS — D509 Iron deficiency anemia, unspecified: Secondary | ICD-10-CM | POA: Diagnosis not present

## 2021-05-06 DIAGNOSIS — D688 Other specified coagulation defects: Secondary | ICD-10-CM | POA: Diagnosis not present

## 2021-05-06 DIAGNOSIS — D509 Iron deficiency anemia, unspecified: Secondary | ICD-10-CM | POA: Diagnosis not present

## 2021-05-06 DIAGNOSIS — Z992 Dependence on renal dialysis: Secondary | ICD-10-CM | POA: Diagnosis not present

## 2021-05-06 DIAGNOSIS — N2581 Secondary hyperparathyroidism of renal origin: Secondary | ICD-10-CM | POA: Diagnosis not present

## 2021-05-06 DIAGNOSIS — D631 Anemia in chronic kidney disease: Secondary | ICD-10-CM | POA: Diagnosis not present

## 2021-05-06 DIAGNOSIS — N186 End stage renal disease: Secondary | ICD-10-CM | POA: Diagnosis not present

## 2021-05-08 DIAGNOSIS — D688 Other specified coagulation defects: Secondary | ICD-10-CM | POA: Diagnosis not present

## 2021-05-08 DIAGNOSIS — N186 End stage renal disease: Secondary | ICD-10-CM | POA: Diagnosis not present

## 2021-05-08 DIAGNOSIS — Z992 Dependence on renal dialysis: Secondary | ICD-10-CM | POA: Diagnosis not present

## 2021-05-08 DIAGNOSIS — N2581 Secondary hyperparathyroidism of renal origin: Secondary | ICD-10-CM | POA: Diagnosis not present

## 2021-05-08 DIAGNOSIS — D631 Anemia in chronic kidney disease: Secondary | ICD-10-CM | POA: Diagnosis not present

## 2021-05-08 DIAGNOSIS — D509 Iron deficiency anemia, unspecified: Secondary | ICD-10-CM | POA: Diagnosis not present

## 2021-05-10 ENCOUNTER — Other Ambulatory Visit: Payer: Self-pay

## 2021-05-10 DIAGNOSIS — T8610 Unspecified complication of kidney transplant: Secondary | ICD-10-CM | POA: Diagnosis not present

## 2021-05-10 DIAGNOSIS — N186 End stage renal disease: Secondary | ICD-10-CM

## 2021-05-10 DIAGNOSIS — D509 Iron deficiency anemia, unspecified: Secondary | ICD-10-CM | POA: Diagnosis not present

## 2021-05-10 DIAGNOSIS — N2581 Secondary hyperparathyroidism of renal origin: Secondary | ICD-10-CM | POA: Diagnosis not present

## 2021-05-10 DIAGNOSIS — D631 Anemia in chronic kidney disease: Secondary | ICD-10-CM | POA: Diagnosis not present

## 2021-05-10 DIAGNOSIS — D688 Other specified coagulation defects: Secondary | ICD-10-CM | POA: Diagnosis not present

## 2021-05-10 DIAGNOSIS — Z992 Dependence on renal dialysis: Secondary | ICD-10-CM | POA: Diagnosis not present

## 2021-05-13 DIAGNOSIS — N2581 Secondary hyperparathyroidism of renal origin: Secondary | ICD-10-CM | POA: Diagnosis not present

## 2021-05-13 DIAGNOSIS — D509 Iron deficiency anemia, unspecified: Secondary | ICD-10-CM | POA: Diagnosis not present

## 2021-05-13 DIAGNOSIS — D631 Anemia in chronic kidney disease: Secondary | ICD-10-CM | POA: Diagnosis not present

## 2021-05-13 DIAGNOSIS — Z992 Dependence on renal dialysis: Secondary | ICD-10-CM | POA: Diagnosis not present

## 2021-05-13 DIAGNOSIS — D688 Other specified coagulation defects: Secondary | ICD-10-CM | POA: Diagnosis not present

## 2021-05-13 DIAGNOSIS — N186 End stage renal disease: Secondary | ICD-10-CM | POA: Diagnosis not present

## 2021-05-15 DIAGNOSIS — N2581 Secondary hyperparathyroidism of renal origin: Secondary | ICD-10-CM | POA: Diagnosis not present

## 2021-05-15 DIAGNOSIS — D688 Other specified coagulation defects: Secondary | ICD-10-CM | POA: Diagnosis not present

## 2021-05-15 DIAGNOSIS — D631 Anemia in chronic kidney disease: Secondary | ICD-10-CM | POA: Diagnosis not present

## 2021-05-15 DIAGNOSIS — N186 End stage renal disease: Secondary | ICD-10-CM | POA: Diagnosis not present

## 2021-05-15 DIAGNOSIS — Z992 Dependence on renal dialysis: Secondary | ICD-10-CM | POA: Diagnosis not present

## 2021-05-15 DIAGNOSIS — D509 Iron deficiency anemia, unspecified: Secondary | ICD-10-CM | POA: Diagnosis not present

## 2021-05-17 DIAGNOSIS — D631 Anemia in chronic kidney disease: Secondary | ICD-10-CM | POA: Diagnosis not present

## 2021-05-17 DIAGNOSIS — N186 End stage renal disease: Secondary | ICD-10-CM | POA: Diagnosis not present

## 2021-05-17 DIAGNOSIS — D509 Iron deficiency anemia, unspecified: Secondary | ICD-10-CM | POA: Diagnosis not present

## 2021-05-17 DIAGNOSIS — Z992 Dependence on renal dialysis: Secondary | ICD-10-CM | POA: Diagnosis not present

## 2021-05-17 DIAGNOSIS — D688 Other specified coagulation defects: Secondary | ICD-10-CM | POA: Diagnosis not present

## 2021-05-17 DIAGNOSIS — N2581 Secondary hyperparathyroidism of renal origin: Secondary | ICD-10-CM | POA: Diagnosis not present

## 2021-05-20 DIAGNOSIS — D631 Anemia in chronic kidney disease: Secondary | ICD-10-CM | POA: Diagnosis not present

## 2021-05-20 DIAGNOSIS — D688 Other specified coagulation defects: Secondary | ICD-10-CM | POA: Diagnosis not present

## 2021-05-20 DIAGNOSIS — N186 End stage renal disease: Secondary | ICD-10-CM | POA: Diagnosis not present

## 2021-05-20 DIAGNOSIS — Z992 Dependence on renal dialysis: Secondary | ICD-10-CM | POA: Diagnosis not present

## 2021-05-20 DIAGNOSIS — N2581 Secondary hyperparathyroidism of renal origin: Secondary | ICD-10-CM | POA: Diagnosis not present

## 2021-05-20 DIAGNOSIS — D509 Iron deficiency anemia, unspecified: Secondary | ICD-10-CM | POA: Diagnosis not present

## 2021-05-22 DIAGNOSIS — Z992 Dependence on renal dialysis: Secondary | ICD-10-CM | POA: Diagnosis not present

## 2021-05-22 DIAGNOSIS — D688 Other specified coagulation defects: Secondary | ICD-10-CM | POA: Diagnosis not present

## 2021-05-22 DIAGNOSIS — N186 End stage renal disease: Secondary | ICD-10-CM | POA: Diagnosis not present

## 2021-05-22 DIAGNOSIS — N2581 Secondary hyperparathyroidism of renal origin: Secondary | ICD-10-CM | POA: Diagnosis not present

## 2021-05-22 DIAGNOSIS — D509 Iron deficiency anemia, unspecified: Secondary | ICD-10-CM | POA: Diagnosis not present

## 2021-05-22 DIAGNOSIS — D631 Anemia in chronic kidney disease: Secondary | ICD-10-CM | POA: Diagnosis not present

## 2021-05-24 ENCOUNTER — Ambulatory Visit (INDEPENDENT_AMBULATORY_CARE_PROVIDER_SITE_OTHER): Payer: Medicare Other | Admitting: Vascular Surgery

## 2021-05-24 ENCOUNTER — Ambulatory Visit (INDEPENDENT_AMBULATORY_CARE_PROVIDER_SITE_OTHER)
Admission: RE | Admit: 2021-05-24 | Discharge: 2021-05-24 | Disposition: A | Discharge status: Home / Self Care | Payer: Medicare Other | Source: Ambulatory Visit | Attending: Vascular Surgery | Admitting: Vascular Surgery

## 2021-05-24 ENCOUNTER — Other Ambulatory Visit: Payer: Self-pay

## 2021-05-24 ENCOUNTER — Ambulatory Visit (HOSPITAL_COMMUNITY)
Admission: RE | Admit: 2021-05-24 | Discharge: 2021-05-24 | Disposition: A | Discharge status: Home / Self Care | Payer: Medicare Other | Source: Ambulatory Visit | Attending: Vascular Surgery | Admitting: Vascular Surgery

## 2021-05-24 ENCOUNTER — Encounter: Payer: Self-pay | Admitting: Vascular Surgery

## 2021-05-24 VITALS — BP 127/80 | HR 69 | Temp 98.0°F | Resp 18 | Ht 70.0 in | Wt 171.0 lb

## 2021-05-24 DIAGNOSIS — N186 End stage renal disease: Secondary | ICD-10-CM

## 2021-05-24 DIAGNOSIS — D631 Anemia in chronic kidney disease: Secondary | ICD-10-CM | POA: Diagnosis not present

## 2021-05-24 DIAGNOSIS — D688 Other specified coagulation defects: Secondary | ICD-10-CM | POA: Diagnosis not present

## 2021-05-24 DIAGNOSIS — N2581 Secondary hyperparathyroidism of renal origin: Secondary | ICD-10-CM | POA: Diagnosis not present

## 2021-05-24 DIAGNOSIS — Z992 Dependence on renal dialysis: Secondary | ICD-10-CM | POA: Diagnosis not present

## 2021-05-24 DIAGNOSIS — D509 Iron deficiency anemia, unspecified: Secondary | ICD-10-CM | POA: Diagnosis not present

## 2021-05-24 NOTE — Progress Notes (Signed)
Office Note     CC:  ESRD Requesting Provider:  [name], MD  HPI: [name] is a Right handed 70 y.o. (07-Jul-1951) male with kidney disease who presents at the request of [name], MD for permanent HD access.  Capers has had multiple prior access procedures including bilateral radiocephalic fistulas which did not mature, followed by right sided brachiocephalic fistula.  This was created in 2013 and used until the patient underwent kidney transplant.  His transplant was deemed disadvantaged however lasted for nearly 7 years. Tunneled lines have been placed in right internal jugular vein. Current access is right tunneled HD line.  Field presents to clinic today accompanied by his wife.  On exam, he was doing well with no complaints.  He understands the role of dialysis as well as fistula creation. [name] did note at the time of left radiocephalic fistula creation his arm had significant edema, and therefore the fistula was ligated.  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   The pt is  on a statin for cholesterol management.  The pt is not on a daily aspirin.   Other AC:   The pt is on medications for hypertension.   The pt is not diabetic. Tobacco hx:  -  Past Medical History:  Diagnosis Date   Anemia    Anemia    Benign colon polyp 2012   Clot    STATED TOLD IN APRIL CLOT TO LEFT SHOULDER. 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   HTN (hypertension)    Hyperlipidemia    PONV (postoperative nausea and vomiting)    Renal failure     Past Surgical History:  Procedure Laterality Date   AV FISTULA PLACEMENT  01/19/2012   Procedure: ARTERIOVENOUS (AV) FISTULA CREATION;  Surgeon: Conrad Delanson, MD;  Location: [place];  Service: Vascular;  Laterality: Right;  Right Brachio-cephalic arteriovenous fistula   AV FISTULA PLACEMENT,  RADIOCEPHALIC  95/28/41   Right arm   INSERTION OF DIALYSIS CATHETER  01/19/2012   Procedure: INSERTION OF DIALYSIS CATHETER;  Surgeon: Conrad [place], MD;  Location: [place];  Service: Vascular;  Laterality: N/A;  right internal jugular vein   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    Social History   Socioeconomic History   Marital status: Married    Spouse name: Not on file   Number of children: 5   Years of education: Not on file   Highest education level: Not on file  Occupational History   Occupation: PhD Lobbyist: [employer]  Tobacco Use   Smoking status: Never   Smokeless tobacco: Never  Substance and Sexual Activity   Alcohol use: No    Comment: 1 drink a month or non   Drug use: No   Sexual activity: Yes    Birth control/protection: Condom  Other Topics Concern   Not on file  Social History Narrative   Regular Exercise -  YES         Social Determinants of Health   Financial Resource Strain: Not on file  Food Insecurity: Not on file  Transportation Needs: Not on file  Physical Activity: Not on file  Stress: Not on file  Social Connections: Not on file  Intimate Partner Violence: Not on file    Family History  Problem Relation Age of Onset   Diabetes Father    Colon cancer Neg Hx    Esophageal cancer Neg Hx    Stomach cancer Neg Hx    Rectal cancer Neg Hx     Current Outpatient Medications  Medication Sig Dispense Refill   ACCU-CHEK SOFTCLIX LANCETS lancets 1 each by Other route 2 (two) times daily. Use to check blood sugars twice a day 100 each 3   acetaminophen (TYLENOL) 500 MG tablet Take 1,000 mg by mouth.     amLODipine (NORVASC) 5 MG tablet Take 5 mg by mouth daily.     atorvastatin (LIPITOR) 10 MG tablet Take 10 mg by mouth daily.  7   Blood Glucose Monitoring Suppl (ACCU-CHEK AVIVA PLUS) w/Device KIT Use as directed to check blood sugars 1 kit 0   calcitRIOL (ROCALTROL) 0.5 MCG capsule Take 0.5 mcg by mouth daily.  11   carvedilol  (COREG) 25 MG tablet Take 25 mg by mouth 2 (two) times daily with a meal.     Cholecalciferol (VITAMIN D3 PO) Take 1 tablet by mouth daily.     ciclopirox (PENLAC) 8 % solution Apply topically at bedtime. Apply over nail and surrounding skin. Apply daily over previous coat. After seven (7) days, may remove with alcohol and continue cycle. 6.6 mL 2   glucose blood test strip Use TID 100 each 12   magnesium oxide (MAG-OX) 400 MG tablet Take 400 mg by mouth daily.     mycophenolate (MYFORTIC) 180 MG EC tablet Take 360 mg by mouth 2 (two) times daily.     tacrolimus (PROGRAF) 1 MG capsule Take 5 mg by mouth 2 (two) times daily. Take 36m in the morning and 663min the evening     No current facility-administered medications for this visit.    Allergies  Allergen Reactions   Hydrocodone Itching     REVIEW OF SYSTEMS:   '[X]'  denotes positive finding, '[ ]'  denotes negative finding Cardiac  Comments:  Chest pain or chest pressure:    Shortness of breath upon exertion:    Short of breath when lying flat:    Irregular heart rhythm:        Vascular    Pain in calf, thigh, or hip brought on by ambulation:    Pain in feet at night that wakes you up from your sleep:     Blood clot in your veins:    Leg swelling:         Pulmonary    Oxygen at home:    Productive cough:     Wheezing:         Neurologic    Sudden weakness in arms or legs:     Sudden numbness in arms or legs:     Sudden onset of difficulty speaking or slurred speech:    Temporary loss of vision in one eye:     Problems with dizziness:         Gastrointestinal    Blood in stool:     Vomited blood:         Genitourinary    Burning when urinating:     Blood in urine:        Psychiatric    Major depression:         Hematologic    Bleeding problems:    Problems with blood clotting too easily:        Skin    Rashes or ulcers:  Constitutional    Fever or chills:      PHYSICAL EXAMINATION:  Vitals:    05/24/21 1551  BP: 127/80  Pulse: 69  Resp: 18  Temp: 98 F (36.7 C)  TempSrc: Temporal  SpO2: 98%  Weight: 171 lb (77.6 kg)  Height: '5\' 10"'  (1.778 m)    General:  WDWN in NAD; vital signs documented above Gait: Not observed HENT: WNL, normocephalic Pulmonary: normal non-labored breathing , without Rales, rhonchi,  wheezing Cardiac: regular HR,  Abdomen: soft, NT, no masses Skin: without rashes Vascular Exam/Pulses:  Right Left  Radial 2+ (normal) 2+ (normal)  Ulnar 2+ (normal) 2+ (normal)                   Extremities: without ischemic changes, without Gangrene , without cellulitis; without open wounds;  Musculoskeletal: no muscle wasting or atrophy  Neurologic: A&O X 3;  No focal weakness or paresthesias are detected Psychiatric:  The pt has Normal affect.   Non-Invasive Vascular Imaging:   Noninvasive imaging was evaluated demonstrating adequately sized cephalic vein in the left upper extremity as well as bilateral basilic veins. The artery was of sufficient size    ASSESSMENT/PLAN:  JOAH PATLAN is a 71 y.o. male who presents with end stage renal disease  Based on vein mapping and examination, this patient's best permanent access option is: left brachiocephalic fistula.  The history of swelling is of concern, and therefore prior to fistula creation, I will perform a venogram to ensure no central stenosis, if stenosis present, will plasty prior to proceeding with fistula. I had an extensive discussion with this patient in regards to the nature of access surgery, including risk, benefits, and alternatives.   The patient is aware that the risks of access surgery include but are not limited to: bleeding, infection, steal syndrome, nerve damage, ischemic monomelic neuropathy, failure of access to mature, complications related to venous hypertension, and possible need for additional access procedures in the future. The patient has agreed to proceed with left  brachiocephalic fistula and will be scheduled for central venogram, left brachiocephalic fistula  Broadus John, MD [facility] [phone]

## 2021-05-27 ENCOUNTER — Other Ambulatory Visit: Payer: Self-pay

## 2021-05-27 DIAGNOSIS — D509 Iron deficiency anemia, unspecified: Secondary | ICD-10-CM | POA: Diagnosis not present

## 2021-05-27 DIAGNOSIS — D631 Anemia in chronic kidney disease: Secondary | ICD-10-CM | POA: Diagnosis not present

## 2021-05-27 DIAGNOSIS — Z992 Dependence on renal dialysis: Secondary | ICD-10-CM | POA: Diagnosis not present

## 2021-05-27 DIAGNOSIS — N186 End stage renal disease: Secondary | ICD-10-CM | POA: Diagnosis not present

## 2021-05-27 DIAGNOSIS — D688 Other specified coagulation defects: Secondary | ICD-10-CM | POA: Diagnosis not present

## 2021-05-27 DIAGNOSIS — N2581 Secondary hyperparathyroidism of renal origin: Secondary | ICD-10-CM | POA: Diagnosis not present

## 2021-05-29 ENCOUNTER — Other Ambulatory Visit: Payer: Self-pay

## 2021-05-29 ENCOUNTER — Encounter (HOSPITAL_COMMUNITY): Payer: Self-pay | Admitting: Vascular Surgery

## 2021-05-29 DIAGNOSIS — D631 Anemia in chronic kidney disease: Secondary | ICD-10-CM | POA: Diagnosis not present

## 2021-05-29 DIAGNOSIS — D509 Iron deficiency anemia, unspecified: Secondary | ICD-10-CM | POA: Diagnosis not present

## 2021-05-29 DIAGNOSIS — N2581 Secondary hyperparathyroidism of renal origin: Secondary | ICD-10-CM | POA: Diagnosis not present

## 2021-05-29 DIAGNOSIS — D688 Other specified coagulation defects: Secondary | ICD-10-CM | POA: Diagnosis not present

## 2021-05-29 DIAGNOSIS — Z992 Dependence on renal dialysis: Secondary | ICD-10-CM | POA: Diagnosis not present

## 2021-05-29 DIAGNOSIS — N186 End stage renal disease: Secondary | ICD-10-CM | POA: Diagnosis not present

## 2021-05-29 NOTE — Progress Notes (Signed)
PCP - Dr. Ronnald Ramp  Cardiologist - Denies  EP- Denies  Endocrine- Denies  Pulm- Denies  Chest x-ray - 09/15/20 (E)  EKG - 09/15/20 (E)  Stress Test -  Denies  ECHO - 09/16/20 (E)  Cardiac Cath -  Denies  AICD-na PM-na LOOP-na  Dialysis- M-W-F, had a full session to day 05/29/21  Sleep Study -  Denies CPAP -  Denies  LABS-05/30/21: I-Stat-8  ASA- Denies  ERAS-No  HA1C- 03/12/21 (E): 5.8 Fasting Blood Sugar -  Checks Blood Sugar __0___ times a day- Pt states he does not check his blood sugar at home, it only gets checked during his dialysis sessions.  Anesthesia- No  Pt denies having chest pain, sob, or fever during the pre-op phone call. All instructions explained to the pt, with a verbal understanding of the material including: as of today, stop taking all Aspirin (unless instructed by your doctor) and Other Aspirin containing products, Vitamins, Fish oils, and Herbal medications. Also stop all NSAIDS i.e. Advil, Ibuprofen, Motrin, Aleve, Anaprox, Naproxen, BC, Goody Powders, and all Supplements. Pt also instructed to wear a mask and social distance if he has to go out prior to his surgery. The opportunity to ask questions was provided.    Coronavirus Screening  Have you experienced the following symptoms:  Cough yes/no: No Fever (>100.78F)  yes/no: No Runny nose yes/no: No Sore throat yes/no: No Difficulty breathing/shortness of breath  yes/no: No  Have you or a family member traveled in the last 14 days and where? yes/no: No   If the patient indicates "YES" to the above questions, their PAT will be rescheduled to limit the exposure to others and, the surgeon will be notified. THE PATIENT WILL NEED TO BE ASYMPTOMATIC FOR 14 DAYS.   If the patient is not experiencing any of these symptoms, the PAT nurse will instruct them to NOT bring anyone with them to their appointment since they may have these symptoms or traveled as well.   Please remind your patients and families  that hospital visitation restrictions are in effect and the importance of the restrictions.

## 2021-05-29 NOTE — Anesthesia Preprocedure Evaluation (Addendum)
Anesthesia Evaluation  Patient identified by MRN, date of birth, ID band Patient awake    Reviewed: Allergy & Precautions, NPO status , Patient's Chart, lab work & pertinent test results  History of Anesthesia Complications Negative for: history of anesthetic complications  Airway Mallampati: II  TM Distance: >3 FB Neck ROM: Full    Dental no notable dental hx. (+) Dental Advisory Given   Pulmonary neg pulmonary ROS,    Pulmonary exam normal        Cardiovascular hypertension, + Peripheral Vascular Disease  Normal cardiovascular exam     Neuro/Psych  Headaches,    GI/Hepatic negative GI ROS, Neg liver ROS,   Endo/Other  diabetes  Renal/GU ESRFRenal disease     Musculoskeletal negative musculoskeletal ROS (+)   Abdominal   Peds  Hematology negative hematology ROS (+) anemia ,   Anesthesia Other Findings   Reproductive/Obstetrics                           Anesthesia Physical Anesthesia Plan  ASA: 3  Anesthesia Plan: Regional and MAC   Post-op Pain Management:    Induction:   PONV Risk Score and Plan: 1  Airway Management Planned: Natural Airway  Additional Equipment:   Intra-op Plan:   Post-operative Plan:   Informed Consent: I have reviewed the patients History and Physical, chart, labs and discussed the procedure including the risks, benefits and alternatives for the proposed anesthesia with the patient or authorized representative who has indicated his/her understanding and acceptance.     Dental advisory given  Plan Discussed with: Anesthesiologist, CRNA and Surgeon  Anesthesia Plan Comments:        Anesthesia Quick Evaluation

## 2021-05-30 ENCOUNTER — Encounter (HOSPITAL_COMMUNITY): Payer: Self-pay | Admitting: Vascular Surgery

## 2021-05-30 ENCOUNTER — Ambulatory Visit (HOSPITAL_COMMUNITY)
Admission: RE | Admit: 2021-05-30 | Discharge: 2021-05-30 | Disposition: A | Discharge status: Home / Self Care | Payer: Medicare Other | Attending: Vascular Surgery | Admitting: Vascular Surgery

## 2021-05-30 ENCOUNTER — Other Ambulatory Visit: Payer: Self-pay

## 2021-05-30 ENCOUNTER — Encounter (HOSPITAL_COMMUNITY)
Admission: RE | Disposition: A | Discharge status: Home / Self Care | Payer: Self-pay | Source: Home / Self Care | Attending: Vascular Surgery

## 2021-05-30 ENCOUNTER — Ambulatory Visit (HOSPITAL_COMMUNITY): Payer: Medicare Other | Admitting: Anesthesiology

## 2021-05-30 ENCOUNTER — Ambulatory Visit (HOSPITAL_COMMUNITY): Payer: Medicare Other

## 2021-05-30 DIAGNOSIS — Z992 Dependence on renal dialysis: Secondary | ICD-10-CM | POA: Diagnosis not present

## 2021-05-30 DIAGNOSIS — N186 End stage renal disease: Secondary | ICD-10-CM | POA: Insufficient documentation

## 2021-05-30 DIAGNOSIS — E1122 Type 2 diabetes mellitus with diabetic chronic kidney disease: Secondary | ICD-10-CM | POA: Insufficient documentation

## 2021-05-30 DIAGNOSIS — I70298 Other atherosclerosis of native arteries of extremities, other extremity: Secondary | ICD-10-CM | POA: Diagnosis not present

## 2021-05-30 DIAGNOSIS — Z940 Kidney transplant status: Secondary | ICD-10-CM | POA: Diagnosis not present

## 2021-05-30 DIAGNOSIS — Z419 Encounter for procedure for purposes other than remedying health state, unspecified: Secondary | ICD-10-CM

## 2021-05-30 DIAGNOSIS — I120 Hypertensive chronic kidney disease with stage 5 chronic kidney disease or end stage renal disease: Secondary | ICD-10-CM | POA: Diagnosis not present

## 2021-05-30 DIAGNOSIS — Z79899 Other long term (current) drug therapy: Secondary | ICD-10-CM | POA: Diagnosis not present

## 2021-05-30 DIAGNOSIS — D631 Anemia in chronic kidney disease: Secondary | ICD-10-CM | POA: Diagnosis not present

## 2021-05-30 DIAGNOSIS — I82B22 Chronic embolism and thrombosis of left subclavian vein: Secondary | ICD-10-CM | POA: Diagnosis not present

## 2021-05-30 HISTORY — DX: Type 2 diabetes mellitus without complications: E11.9

## 2021-05-30 HISTORY — PX: VENOGRAM: SHX5497

## 2021-05-30 LAB — GLUCOSE, CAPILLARY
Glucose-Capillary: 91 mg/dL (ref 70–99)
Glucose-Capillary: 120 mg/dL — ABNORMAL HIGH (ref 70–99)
Glucose-Capillary: 128 mg/dL — ABNORMAL HIGH (ref 70–99)

## 2021-05-30 LAB — POCT I-STAT, CHEM 8
BUN: 30 mg/dL — ABNORMAL HIGH (ref 8–23)
Calcium, Ion: 1.03 mmol/L — ABNORMAL LOW (ref 1.15–1.40)
Chloride: 96 mmol/L — ABNORMAL LOW (ref 98–111)
Creatinine, Ser: 8.8 mg/dL — ABNORMAL HIGH (ref 0.61–1.24)
Glucose, Bld: 89 mg/dL (ref 70–99)
HCT: 31 % — ABNORMAL LOW (ref 39.0–52.0)
Hemoglobin: 10.5 g/dL — ABNORMAL LOW (ref 13.0–17.0)
Potassium: 4.6 mmol/L (ref 3.5–5.1)
Sodium: 138 mmol/L (ref 135–145)
TCO2: 34 mmol/L — ABNORMAL HIGH (ref 22–32)

## 2021-05-30 SURGERY — VENOGRAM
Anesthesia: Monitor Anesthesia Care | Site: Arm Upper | Laterality: Left

## 2021-05-30 MED ORDER — SODIUM CHLORIDE 0.9 % IV SOLN: INTRAVENOUS | Status: DC

## 2021-05-30 MED ORDER — CHLORHEXIDINE GLUCONATE 4 % EX LIQD: 60.0000 mL | Freq: Once | CUTANEOUS | Status: DC

## 2021-05-30 MED ORDER — FENTANYL CITRATE (PF) 100 MCG/2ML IJ SOLN: 100.0000 ug | Freq: Once | INTRAMUSCULAR | Status: AC

## 2021-05-30 MED ORDER — ORAL CARE MOUTH RINSE: 15.0000 mL | Freq: Once | OROMUCOSAL | Status: AC

## 2021-05-30 MED ORDER — 0.9 % SODIUM CHLORIDE (POUR BTL) OPTIME
TOPICAL | Status: DC | PRN
  Administered 2021-05-30: 1000 mL

## 2021-05-30 MED ORDER — HEPARIN SODIUM (PORCINE) 1000 UNIT/ML IJ SOLN
INTRAMUSCULAR | Status: DC | PRN
  Administered 2021-05-30: 5000 [IU] via INTRAVENOUS

## 2021-05-30 MED ORDER — IODIXANOL 320 MG/ML IV SOLN
INTRAVENOUS | Status: DC | PRN
  Administered 2021-05-30: 75 mL via INTRA_ARTERIAL

## 2021-05-30 MED ORDER — AMISULPRIDE (ANTIEMETIC) 5 MG/2ML IV SOLN
10.0000 mg | Freq: Once | INTRAVENOUS | Status: DC | PRN
Start: 2021-05-30 — End: 2021-05-30

## 2021-05-30 MED ORDER — LIDOCAINE-EPINEPHRINE (PF) 1.5 %-1:200000 IJ SOLN
INTRAMUSCULAR | Status: DC | PRN
  Administered 2021-05-30: 30 mL via PERINEURAL

## 2021-05-30 MED ORDER — CARVEDILOL 12.5 MG PO TABS
ORAL_TABLET | ORAL | Status: AC
  Administered 2021-05-30: 25 mg
  Filled 2021-05-30: qty 2

## 2021-05-30 MED ORDER — PROPOFOL 500 MG/50ML IV EMUL
INTRAVENOUS | Status: DC | PRN
  Administered 2021-05-30: 50 ug/kg/min via INTRAVENOUS

## 2021-05-30 MED ORDER — FENTANYL CITRATE (PF) 100 MCG/2ML IJ SOLN
INTRAMUSCULAR | Status: AC
  Administered 2021-05-30: 100 ug via INTRAVENOUS
  Filled 2021-05-30: qty 2

## 2021-05-30 MED ORDER — PROMETHAZINE HCL 25 MG/ML IJ SOLN: 6.2500 mg | INTRAMUSCULAR | Status: DC | PRN

## 2021-05-30 MED ORDER — ONDANSETRON HCL 4 MG/2ML IJ SOLN
INTRAMUSCULAR | Status: DC | PRN
  Administered 2021-05-30: 4 mg via INTRAVENOUS

## 2021-05-30 MED ORDER — CHLORHEXIDINE GLUCONATE 0.12 % MT SOLN
15.0000 mL | Freq: Once | OROMUCOSAL | Status: AC
  Administered 2021-05-30: 15 mL via OROMUCOSAL
  Filled 2021-05-30: qty 15

## 2021-05-30 MED ORDER — FENTANYL CITRATE (PF) 250 MCG/5ML IJ SOLN
INTRAMUSCULAR | Status: AC
  Filled 2021-05-30: qty 5

## 2021-05-30 MED ORDER — MIDAZOLAM HCL 2 MG/2ML IJ SOLN
INTRAMUSCULAR | Status: AC
  Administered 2021-05-30: 2 mg via INTRAVENOUS
  Filled 2021-05-30: qty 2

## 2021-05-30 MED ORDER — CEFAZOLIN SODIUM-DEXTROSE 2-4 GM/100ML-% IV SOLN
2.0000 g | INTRAVENOUS | Status: AC
  Administered 2021-05-30: 2 g via INTRAVENOUS
  Filled 2021-05-30: qty 100

## 2021-05-30 MED ORDER — MIDAZOLAM HCL 2 MG/2ML IJ SOLN: 2.0000 mg | Freq: Once | INTRAMUSCULAR | Status: AC

## 2021-05-30 MED ORDER — HEPARIN 6000 UNIT IRRIGATION SOLUTION
Status: DC | PRN
  Administered 2021-05-30: 1

## 2021-05-30 MED ORDER — ACETAMINOPHEN 500 MG PO TABS
1000.0000 mg | ORAL_TABLET | Freq: Once | ORAL | Status: AC
  Administered 2021-05-30: 1000 mg via ORAL
  Filled 2021-05-30: qty 2

## 2021-05-30 MED ORDER — HEPARIN 6000 UNIT IRRIGATION SOLUTION
Status: AC
  Filled 2021-05-30: qty 500

## 2021-05-30 MED ORDER — FENTANYL CITRATE (PF) 100 MCG/2ML IJ SOLN: 25.0000 ug | INTRAMUSCULAR | Status: DC | PRN

## 2021-05-30 MED ORDER — DEXAMETHASONE SODIUM PHOSPHATE 10 MG/ML IJ SOLN
INTRAMUSCULAR | Status: DC | PRN
  Administered 2021-05-30: 5 mg

## 2021-05-30 SURGICAL SUPPLY — 45 items
ADH SKN CLS APL DERMABOND .7 (GAUZE/BANDAGES/DRESSINGS) ×2
ARMBAND PINK RESTRICT EXTREMIT (MISCELLANEOUS) ×4 IMPLANT
BAG COUNTER SPONGE SURGICOUNT (BAG) ×3 IMPLANT
BAG SPNG CNTER NS LX DISP (BAG) ×2
CANISTER SUCT 3000ML PPV (MISCELLANEOUS) ×3 IMPLANT
CATH ANGIO 5F BER2 100CM (CATHETERS) ×3 IMPLANT
CATH BEACON 5 .035 65 KMP TIP (CATHETERS) ×2 IMPLANT
CATH QUICKCROSS SUPP .035X90CM (MICROCATHETER) ×3 IMPLANT
CATH SOS OMNI O 5F 80CM (CATHETERS) ×3 IMPLANT
CLIP VESOCCLUDE MED 6/CT (CLIP) ×3 IMPLANT
CLIP VESOCCLUDE SM WIDE 6/CT (CLIP) ×3 IMPLANT
COVER PROBE W GEL 5X96 (DRAPES) ×3 IMPLANT
DERMABOND ADVANCED (GAUZE/BANDAGES/DRESSINGS) ×1
DERMABOND ADVANCED .7 DNX12 (GAUZE/BANDAGES/DRESSINGS) ×2 IMPLANT
DRAPE C-ARM 42X72 X-RAY (DRAPES) ×2 IMPLANT
DRSG TEGADERM 2-3/8X2-3/4 SM (GAUZE/BANDAGES/DRESSINGS) ×2 IMPLANT
ELECT REM PT RETURN 9FT ADLT (ELECTROSURGICAL) ×3
ELECTRODE REM PT RTRN 9FT ADLT (ELECTROSURGICAL) ×2 IMPLANT
GAUZE SPONGE 2X2 8PLY STRL LF (GAUZE/BANDAGES/DRESSINGS) ×1 IMPLANT
GLIDEWIRE ADV .035X180CM (WIRE) ×2 IMPLANT
GLIDEWIRE ADV .035X260CM (WIRE) ×2 IMPLANT
GLOVE SRG 8 PF TXTR STRL LF DI (GLOVE) ×4 IMPLANT
GLOVE SURG POLYISO LF SZ8 (GLOVE) IMPLANT
GLOVE SURG UNDER POLY LF SZ8 (GLOVE) ×6
GOWN STRL REUS W/ TWL LRG LVL3 (GOWN DISPOSABLE) ×6 IMPLANT
GOWN STRL REUS W/TWL 2XL LVL3 (GOWN DISPOSABLE) ×3 IMPLANT
GOWN STRL REUS W/TWL LRG LVL3 (GOWN DISPOSABLE) ×9
KIT BASIN OR (CUSTOM PROCEDURE TRAY) ×3 IMPLANT
KIT TURNOVER KIT B (KITS) ×3 IMPLANT
NS IRRIG 1000ML POUR BTL (IV SOLUTION) ×3 IMPLANT
PACK CV ACCESS (CUSTOM PROCEDURE TRAY) ×3 IMPLANT
PAD ARMBOARD 7.5X6 YLW CONV (MISCELLANEOUS) ×6 IMPLANT
SET MICROPUNCTURE 5F STIFF (MISCELLANEOUS) ×2 IMPLANT
SHEATH PINNACLE 6F 10CM (SHEATH) ×3 IMPLANT
SPONGE GAUZE 2X2 STER 10/PKG (GAUZE/BANDAGES/DRESSINGS) ×1
STOPCOCK MORSE 400PSI 3WAY (MISCELLANEOUS) ×3 IMPLANT
SUT MNCRL AB 4-0 PS2 18 (SUTURE) ×3 IMPLANT
SUT PROLENE 6 0 BV (SUTURE) ×3 IMPLANT
SUT PROLENE 7 0 BV 1 (SUTURE) IMPLANT
SUT VIC AB 3-0 SH 27 (SUTURE) ×3
SUT VIC AB 3-0 SH 27X BRD (SUTURE) ×2 IMPLANT
TOWEL GREEN STERILE (TOWEL DISPOSABLE) ×3 IMPLANT
TUBING CIL FLEX 10 FLL-RA (TUBING) ×3 IMPLANT
UNDERPAD 30X36 HEAVY ABSORB (UNDERPADS AND DIAPERS) ×3 IMPLANT
WATER STERILE IRR 1000ML POUR (IV SOLUTION) ×3 IMPLANT

## 2021-05-30 NOTE — Op Note (Signed)
    NAME: [name]    MRN: [id number] DOB: 05/26/1951    DATE OF OPERATION: 05/30/2021  PREOP DIAGNOSIS:    End-stage renal disease  POSTOP DIAGNOSIS:    Same  PROCEDURE:    Left upper extremity venogram, central venogram  SURGEON: Broadus John  ASSIST: OR staff  ANESTHESIA: Moderate  EBL: 5 mL  INDICATIONS:    [name] is a 70 y.o. right handed male who was recently seen in the office with end-stage renal disease.  The patient has had multiple HD access procedures, most recently right-sided brachiocephalic fistula.  This unfortunately thrombosed, and the patient was seeing me for new access placement.  When asked about prior access procedures, [name] noted previous left radiocephalic fistula which caused significant arm swelling.  This was ligated due to the swelling.  Upper extremity vein mapping demonstrated excellent brachiocephalic vein in the left upper extremity. With his previous history of postoperative arm swelling, I thought it prudent to perform a venogram prior to fistula creation.  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  Diffuse collateralization through the neck and chest.  Right-sided dominant vein stenosis around the tunneled HD catheter.  TECHNIQUE:   Patient was brought to the OR laid in the supine position.  Anesthesia was induced and the patient was prepped and draped in standard fashion.  Timeout was performed.  The case began with ultrasound-guided micropuncture access of the left basilic vein, with 5 French sheath placement for left upper extremity venogram, central venogram.  The basilic vein was widely patent, axillary vein patent, the subclavian vein was occluded with diffuse collateralization  into the neck and chest.  The occlusion continued through the left innominate vein.  I heparinized patient with 5000 units IV heparin in an attempt to create a flow lumen.  Variety of wires and catheters were used in an effort to cross the lesions.  I was able to make my way into the collateral which ended in the contralateral innominate vein.  From this I was able to image the superior vena cava which appeared open.  There appeared to be stenosis within the right innominate vein around his current tunneled line.  The entirety of the arm could not be imaged as I loss push ability due to the tortuosity of the collateral.  Case was terminated the patient was taken to PACU in stable condition  I plan to perform right upper extremity venogram on [name] next Wednesday in the cardiac Cath Lab prior to pursuing fistula.  I believe there will be significant stenoses appreciated as his previous fistula was large, and should not have thrombosed otherwise.  The results of Wednesday we will decide permanent HD access intervention moving forward.   Macie Burows, MD [facility]  DATE OF DICTATION:   05/30/2021

## 2021-05-30 NOTE — Discharge Instructions (Addendum)
   [facility]  Discharge Instructions  AV Fistula or Graft Surgery for Dialysis Access  Please refer to the following instructions for your post-procedure care. Your surgeon or physician assistant will discuss any changes with you.  Activity  You may drive the day following your surgery, if you are comfortable and no longer taking prescription pain medication. Resume full activity as the soreness in your incision resolves.  Bathing/Showering  You may shower after you go home. Keep your incision dry for 48 hours. Do not soak in a bathtub, hot tub, or swim until the incision heals completely. You may not shower if you have a hemodialysis catheter.  Incision Care  Clean your incision with mild soap and water after 48 hours. Pat the area dry with a clean towel. You do not need a bandage unless otherwise instructed. Do not apply any ointments or creams to your incision. You may have skin glue on your incision. Do not peel it off. It will come off on its own in about one week. Your arm may swell a bit after surgery. To reduce swelling use pillows to elevate your arm so it is above your heart. Your doctor will tell you if you need to lightly wrap your arm with an ACE bandage.  Diet  Resume your normal diet. There are not special food restrictions following this procedure. In order to heal from your surgery, it is CRITICAL to get adequate nutrition. Your body requires vitamins, minerals, and protein. Vegetables are the best source of vitamins and minerals. Vegetables also provide the perfect balance of protein. Processed food has little nutritional value, so try to avoid this.  Medications  Resume taking all of your medications. If your incision is causing pain, you may take over-the counter pain relievers such as acetaminophen (Tylenol). If you were prescribed a stronger pain medication, please be aware these medications can cause nausea and constipation. Prevent  nausea by taking the medication with a snack or meal. Avoid constipation by drinking plenty of fluids and eating foods with high amount of fiber, such as fruits, vegetables, and grains.  Do not take Tylenol if you are taking prescription pain medications.  Follow up Your surgeon may want to see you in the office following your access surgery. If so, this will be arranged at the time of your surgery.  Please call us immediately for any of the following conditions:  Increased pain, redness, drainage (pus) from your incision site Fever of 101 degrees or higher Severe or worsening pain at your incision site Hand pain or numbness.  Reduce your risk of vascular disease:  Stop smoking. If you would like help, call [facility] at 1-800-QUIT-NOW 351-409-1869) or [place] at [place] your cholesterol Maintain a desired weight Control your diabetes Keep your blood pressure down  Dialysis  It will take several weeks to several months for your new dialysis access to be ready for use. Your surgeon will determine when it is okay to use it. Your nephrologist will continue to direct your dialysis. You can continue to use your Permcath until your new access is ready for use.   05/30/2021 [name] [id number] April 01, 1951  Surgeon(s): Greg John, MD  Procedure(s): DIAGNOSTIC CENTRAL VENOGRAM   May stick graft immediately   May stick graft on designated area only:   X Do not stick Left AV fistula     If you have any questions, please call the office at [phone].

## 2021-05-30 NOTE — Transfer of Care (Signed)
Immediate Anesthesia Transfer of Care Note  Patient: [name]  Procedure(s) Performed: DIAGNOSTIC CENTRAL VENOGRAM (Left: Arm Upper)  Patient Location: PACU  Anesthesia Type:MAC combined with regional for post-op pain  Level of Consciousness: drowsy  Airway & Oxygen Therapy: Patient Spontanous Breathing and Patient connected to face mask oxygen  Post-op Assessment: Report given to RN and Post -op Vital signs reviewed and stable  Post vital signs: Reviewed and stable  Last Vitals:  Vitals Value Taken Time  BP 161/65 05/30/21 1104  Temp    Pulse 52 05/30/21 1106  Resp 14 05/30/21 1106  SpO2 100 % 05/30/21 1106  Vitals shown include unvalidated device data.  Last Pain:  Vitals:   05/30/21 0808  TempSrc:   PainSc: 0-No pain      Patients Stated Pain Goal: 3 (48/18/56 3149)  Complications: No notable events documented.

## 2021-05-30 NOTE — Anesthesia Procedure Notes (Signed)
Procedure Name: MAC Date/Time: 05/30/2021 9:53 AM Performed by: Imagene Riches, CRNA Pre-anesthesia Checklist: Patient identified, Emergency Drugs available, Suction available, Patient being monitored and Timeout performed Patient Re-evaluated:Patient Re-evaluated prior to induction Oxygen Delivery Method: Simple face mask

## 2021-05-30 NOTE — H&P (Signed)
Note   Patient was seen in preoperative holding with plan for left brachiocephalic fistula. He was doing well with no complaints No changes in physical exam We discussed venogram prior to fistula creation in an effort to ensure he does not have left upper extremity swelling, which is happened in the past. He also stated his right arm fistula White been bothering him.  I said we could discuss excision of the thrombosed fistula in the office at his next visit.  After discussing the risk and benefits of left upper extremity venogram and left sided AV fistula, however elected to proceed.  Greg John MD  CC:  ESRD Requesting Provider:  No ref. provider found  HPI: [name] is a Right handed 70 y.o. (Jul 09, 1951) male with kidney disease who presents at the request of No ref. provider found for permanent HD access.  [name] had multiple prior access procedures including bilateral radiocephalic fistulas which did not mature, followed by right sided brachiocephalic fistula.  This was created in 2013 and used until the patient underwent kidney transplant.  His transplant was deemed disadvantaged however lasted for nearly 7 years. Tunneled lines have been placed in right internal jugular vein. Current access is right tunneled HD line.  [name] presents to clinic today accompanied by his wife.  On exam, he was doing well with no complaints.  He understands the role of dialysis as well as fistula creation. [name] did note at the time of left radiocephalic fistula creation his arm had significant edema, and therefore the fistula was ligated.  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   The pt is  on a statin for cholesterol management.  The pt is not on a daily aspirin.   Other AC:   The pt is on medications for hypertension.   The pt is not diabetic. Tobacco hx:  -  Past Medical History:  Diagnosis Date   Anemia     Anemia    Benign colon polyp 08/11/2010   Clot    STATED TOLD IN APRIL CLOT TO LEFT SHOULDER. 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   HTN (hypertension)    Hyperlipidemia    Renal failure     Past Surgical History:  Procedure Laterality Date   AV FISTULA PLACEMENT  01/19/2012   Procedure: ARTERIOVENOUS (AV) FISTULA CREATION;  Surgeon: Conrad [place], MD;  Location: [facility];  Service: Vascular;  Laterality: Right;  Right Brachio-cephalic arteriovenous fistula   AV FISTULA PLACEMENT, RADIOCEPHALIC  01/00/71   Right arm   INSERTION OF DIALYSIS CATHETER  01/19/2012   Procedure: INSERTION OF DIALYSIS CATHETER;  Surgeon: Conrad Lumpkin, MD;  Location: [facility] OR;  Service: Vascular;  Laterality: N/A;  right internal jugular vein   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    Social History   Socioeconomic History   Marital status: Married    Spouse name: Not on file   Number of children: 5   Years of education: Not on file   Highest education level: Not on file  Occupational History   Occupation: PhD Lobbyist: [employer]  Tobacco Use   Smoking status: Never   Smokeless tobacco: Never  Vaping Use   Vaping Use: Never used  Substance and Sexual Activity   Alcohol use: No    Comment: 1 drink a month or non   Drug use: No   Sexual activity: Yes    Birth control/protection: Condom  Other Topics Concern   Not on file  Social History Narrative   Regular Exercise -  YES         Social Determinants of Health   Financial Resource Strain: Not on file  Food Insecurity: Not on file  Transportation Needs: Not on file  Physical Activity: Not on file  Stress: Not on file  Social Connections: Not on file  Intimate Partner Violence: Not on file    Family History  Problem Relation Age of Onset   Diabetes Father    Colon cancer Neg Hx    Esophageal cancer  Neg Hx    Stomach cancer Neg Hx    Rectal cancer Neg Hx     Current Facility-Administered Medications  Medication Dose Route Frequency Provider Last Rate Last Admin   0.9 %  sodium chloride infusion   Intravenous Continuous Greg John, MD 10 mL/hr at 05/30/21 3754 Restarted at 05/30/21 1052   amisulpride (BARHEMSYS) injection 10 mg  10 mg Intravenous Once PRN Duane Boston, MD       chlorhexidine (HIBICLENS) 4 % liquid 4 application  60 mL Topical Once Greg John, MD       And   [START ON 05/31/2021] chlorhexidine (HIBICLENS) 4 % liquid 4 application  60 mL Topical Once Greg John, MD       fentaNYL (SUBLIMAZE) injection 25-50 mcg  25-50 mcg Intravenous Q5 min PRN Duane Boston, MD       promethazine (PHENERGAN) injection 6.25-12.5 mg  6.25-12.5 mg Intravenous Q15 min PRN Duane Boston, MD       Current Outpatient Medications  Medication Sig Dispense Refill   acetaminophen (TYLENOL) 500 MG tablet Take 1,000 mg by mouth.     amLODipine (NORVASC) 5 MG tablet Take 5 mg by mouth daily.     carvedilol (COREG) 25 MG tablet Take 25 mg by mouth 2 (two) times daily with a meal.     Cinacalcet HCl (SENSIPAR PO) Take 60 mg by mouth daily.     losartan (COZAAR) 25 MG tablet Take 25 mg by mouth every evening.     sevelamer carbonate (RENVELA) 800 MG tablet Take 1,600 mg by mouth 3 (three) times daily with meals.     ACCU-CHEK SOFTCLIX LANCETS lancets 1 each by Other route 2 (two) times daily. Use to check blood sugars twice a day 100 each 3   Blood Glucose Monitoring Suppl (ACCU-CHEK AVIVA PLUS) w/Device KIT Use as directed to check blood sugars 1 kit 0   glucose blood test strip Use TID 100 each 12    No Active Allergies    REVIEW OF SYSTEMS:   _0  denotes positive finding, _1  denotes negative finding Cardiac  Comments:  Chest pain or chest pressure:    Shortness of breath upon exertion:    Short of breath when lying flat:    Irregular heart rhythm:        Vascular     Pain in calf, thigh, or hip brought on by ambulation:    Pain in feet at night that wakes you up from your sleep:     Blood clot in your veins:    Leg swelling:         Pulmonary    Oxygen at home:  Productive cough:     Wheezing:         Neurologic    Sudden weakness in arms or legs:     Sudden numbness in arms or legs:     Sudden onset of difficulty speaking or slurred speech:    Temporary loss of vision in one eye:     Problems with dizziness:         Gastrointestinal    Blood in stool:     Vomited blood:         Genitourinary    Burning when urinating:     Blood in urine:        Psychiatric    Major depression:         Hematologic    Bleeding problems:    Problems with blood clotting too easily:        Skin    Rashes or ulcers:        Constitutional    Fever or chills:      PHYSICAL EXAMINATION:  Vitals:   05/30/21 1104 05/30/21 1119 05/30/21 1134 05/30/21 1149  BP: (!) 161/65 (!) 165/88 (!) 165/75 (!) 160/83  Pulse: (!) 56 (!) 52 (!) 53 (!) 53  Resp: _0 Temp: 97.6 F (36.4 C)     TempSrc:      SpO2: 100% 100% 98% 98%  Weight:      Height:        General:  WDWN in NAD; vital signs documented above Gait: Not observed HENT: WNL, normocephalic Pulmonary: normal non-labored breathing , without Rales, rhonchi,  wheezing Cardiac: regular HR,  Abdomen: soft, NT, no masses Skin: without rashes Vascular Exam/Pulses:  Right Left  Radial 2+ (normal) 2+ (normal)  Ulnar 2+ (normal) 2+ (normal)                   Extremities: without ischemic changes, without Gangrene , without cellulitis; without open wounds;  Musculoskeletal: no muscle wasting or atrophy  Neurologic: A&O X 3;  No focal weakness or paresthesias are detected Psychiatric:  The pt White Normal affect.   Non-Invasive Vascular Imaging:   Noninvasive imaging was evaluated demonstrating adequately sized cephalic vein in the left upper extremity as well as bilateral basilic  veins. The artery was of sufficient size  ASSESSMENT/PLAN:  FREMAN LAPAGE is a 70 y.o. male who presents with end stage renal disease  Based on vein mapping and examination, this patient's best permanent access option is: left brachiocephalic fistula.  The history of swelling is of concern, and therefore prior to fistula creation, I will perform a venogram to ensure no central stenosis, if stenosis present, will plasty prior to proceeding with fistula. I had an extensive discussion with this patient in regards to the nature of access surgery, including risk, benefits, and alternatives.   The patient is aware that the risks of access surgery include but are not limited to: bleeding, infection, steal syndrome, nerve damage, ischemic monomelic neuropathy, failure of access to mature, complications related to venous hypertension, and possible need for additional access procedures in the future. The patient White agreed to proceed with left brachiocephalic fistula and will be scheduled for central venogram, left brachiocephalic fistula  Greg John, MD [facility] [phone]

## 2021-05-30 NOTE — Anesthesia Procedure Notes (Addendum)
Anesthesia Regional Block: Interscalene brachial plexus block   Pre-Anesthetic Checklist: , timeout performed,  Correct Patient, Correct Site, Correct Laterality,  Correct Procedure, Correct Position, site marked,  Risks and benefits discussed,  Surgical consent,  Pre-op evaluation,  At surgeon's request and post-op pain management  Laterality: Left  Prep: chloraprep       Needles:  Injection technique: Single-shot  Needle Type: Echogenic Stimulator Needle     Needle Length: 5cm  Needle Gauge: 22     Additional Needles:   Narrative:  Start time: 05/30/2021 8:41 AM End time: 05/30/2021 8:51 AM Injection made incrementally with aspirations every 5 mL.  Performed by: Personally  Anesthesiologist: Duane Boston, MD  Additional Notes: Functioning IV was confirmed and monitors applied.  A 69mm 22ga echogenic arrow stimulator was used. Sterile prep and drape,hand hygiene and sterile gloves were used.Ultrasound guidance: relevant anatomy identified, needle position confirmed, local anesthetic spread visualized around nerve(s)., vascular puncture avoided.  Image printed for medical record.  Negative aspiration and negative test dose prior to incremental administration of local anesthetic. The patient tolerated the procedure well.

## 2021-05-30 NOTE — Anesthesia Postprocedure Evaluation (Signed)
Anesthesia Post Note  Patient: [name]  Procedure(s) Performed: DIAGNOSTIC CENTRAL VENOGRAM (Left: Arm Upper)     Patient location during evaluation: PACU Anesthesia Type: Regional Level of consciousness: awake and alert Pain management: pain level controlled Vital Signs Assessment: post-procedure vital signs reviewed and stable Respiratory status: spontaneous breathing and respiratory function stable Cardiovascular status: stable Postop Assessment: no apparent nausea or vomiting Anesthetic complications: no   No notable events documented.  Last Vitals:  Vitals:   05/30/21 1134 05/30/21 1149  BP: (!) 165/75 (!) 160/83  Pulse: (!) 53 (!) 53  Resp: 11 11  Temp:    SpO2: 98% 98%    Last Pain:  Vitals:   05/30/21 1149  TempSrc:   PainSc: 0-No pain                 Cordaryl Decelles DANIEL

## 2021-05-30 NOTE — Progress Notes (Deleted)
Note   Patient was seen in preoperative holding with plan for left brachiocephalic fistula. He was doing well with no complaints No changes in physical exam We discussed venogram prior to fistula creation in an effort to ensure he does not have left upper extremity swelling, which is happened in the past. He also stated his right arm fistula has been bothering him.  I said we could discuss excision of the thrombosed fistula in the office at his next visit.  After discussing the risk and benefits of left upper extremity venogram and left sided AV fistula, however elected to proceed.  Greg John MD  CC:  ESRD Requesting Provider:  No ref. provider found  HPI: [name] is a Right handed 70 y.o. (Jul 09, 1951) male with kidney disease who presents at the request of No ref. provider found for permanent HD access.  [name] has had multiple prior access procedures including bilateral radiocephalic fistulas which did not mature, followed by right sided brachiocephalic fistula.  This was created in 2013 and used until the patient underwent kidney transplant.  His transplant was deemed disadvantaged however lasted for nearly 7 years. Tunneled lines have been placed in right internal jugular vein. Current access is right tunneled HD line.  [name] presents to clinic today accompanied by his wife.  On exam, he was doing well with no complaints.  He understands the role of dialysis as well as fistula creation. [name] did note at the time of left radiocephalic fistula creation his arm had significant edema, and therefore the fistula was ligated.  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   The pt is  on a statin for cholesterol management.  The pt is not on a daily aspirin.   Other AC:   The pt is on medications for hypertension.   The pt is not diabetic. Tobacco hx:  -  Past Medical History:  Diagnosis Date   Anemia     Anemia    Benign colon polyp 08/11/2010   Clot    STATED TOLD IN [facility]IL CLOT TO LEFT SHOULDER. 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   HTN (hypertension)    Hyperlipidemia    Renal failure     Past Surgical History:  Procedure Laterality Date   AV FISTULA PLACEMENT  01/19/2012   Procedure: ARTERIOVENOUS (AV) FISTULA CREATION;  Surgeon: Conrad [place], MD;  Location: [facility];  Service: Vascular;  Laterality: Right;  Right Brachio-cephalic arteriovenous fistula   AV FISTULA PLACEMENT, RADIOCEPHALIC  01/00/71   Right arm   INSERTION OF DIALYSIS CATHETER  01/19/2012   Procedure: INSERTION OF DIALYSIS CATHETER;  Surgeon: Conrad Lumpkin, MD;  Location: [facility] OR;  Service: Vascular;  Laterality: N/A;  right internal jugular vein   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    Social History   Socioeconomic History   Marital status: Married    Spouse name: Not on file   Number of children: 5   Years of education: Not on file   Highest education level: Not on file  Occupational History   Occupation: PhD Lobbyist: [employer]  Tobacco Use   Smoking status: Never   Smokeless tobacco: Never  Vaping Use   Vaping Use: Never used  Substance and Sexual Activity   Alcohol use: No    Comment: 1 drink a month or non   Drug use: No   Sexual activity: Yes    Birth control/protection: Condom  Other Topics Concern   Not on file  Social History Narrative   Regular Exercise -  YES         Social Determinants of Health   Financial Resource Strain: Not on file  Food Insecurity: Not on file  Transportation Needs: Not on file  Physical Activity: Not on file  Stress: Not on file  Social Connections: Not on file  Intimate Partner Violence: Not on file    Family History  Problem Relation Age of Onset   Diabetes Father    Colon cancer Neg Hx    Esophageal cancer  Neg Hx    Stomach cancer Neg Hx    Rectal cancer Neg Hx     Current Facility-Administered Medications  Medication Dose Route Frequency Provider Last Rate Last Admin   0.9 %  sodium chloride infusion   Intravenous Continuous Greg John, MD 10 mL/hr at 05/30/21 0825 New Bag at 05/30/21 0825   ceFAZolin (ANCEF) IVPB 2g/100 mL premix  2 g Intravenous 30 min Pre-Op Greg John, MD       chlorhexidine (HIBICLENS) 4 % liquid 4 application  60 mL Topical Once Greg John, MD       And   [START ON 05/31/2021] chlorhexidine (HIBICLENS) 4 % liquid 4 application  60 mL Topical Once Greg John, MD       Facility-Administered Medications Ordered in Other Encounters  Medication Dose Route Frequency Provider Last Rate Last Admin   dexamethasone (DECADRON) injection   Infiltration Anesthesia Intra-op Duane Boston, MD   5 mg at 05/30/21 0901   lidocaine-EPINEPHrine 1.5 %-1:200000 injection   Peri-NEURAL Anesthesia Intra-op Duane Boston, MD   30 mL at 05/30/21 0901    Allergies  Allergen Reactions   Hydrocodone Itching    Pt states he has taken it without any issues     REVIEW OF SYSTEMS:   [X]  denotes positive finding, [ ]  denotes negative finding Cardiac  Comments:  Chest pain or chest pressure:    Shortness of breath upon exertion:    Short of breath when lying flat:    Irregular heart rhythm:        Vascular    Pain in calf, thigh, or hip brought on by ambulation:    Pain in feet at night that wakes you up from your sleep:     Blood clot in your veins:    Leg swelling:         Pulmonary    Oxygen at home:    Productive cough:     Wheezing:         Neurologic    Sudden weakness in arms or legs:     Sudden numbness in arms or legs:     Sudden onset of difficulty speaking or slurred speech:    Temporary loss of vision in one eye:     Problems with dizziness:         Gastrointestinal    Blood in stool:     Vomited blood:         Genitourinary    Burning  when urinating:     Blood in urine:        Psychiatric    Major depression:  Hematologic    Bleeding problems:    Problems with blood clotting too easily:        Skin    Rashes or ulcers:        Constitutional    Fever or chills:      PHYSICAL EXAMINATION:  Vitals:   05/30/21 0905 05/30/21 0910 05/30/21 0914 05/30/21 0919  BP: (!) 163/88 (!) 143/93 120/77 130/72  Pulse: (!) 56 (!) 58 63 60  Resp: (!) 9 11 13 11   Temp:      TempSrc:      SpO2: 99% 100% 100% 100%  Weight:      Height:        General:  WDWN in NAD; vital signs documented above Gait: Not observed HENT: WNL, normocephalic Pulmonary: normal non-labored breathing , without Rales, rhonchi,  wheezing Cardiac: regular HR,  Abdomen: soft, NT, no masses Skin: without rashes Vascular Exam/Pulses:  Right Left  Radial 2+ (normal) 2+ (normal)  Ulnar 2+ (normal) 2+ (normal)                   Extremities: without ischemic changes, without Gangrene , without cellulitis; without open wounds;  Musculoskeletal: no muscle wasting or atrophy  Neurologic: A&O X 3;  No focal weakness or paresthesias are detected Psychiatric:  The pt has Normal affect.   Non-Invasive Vascular Imaging:   Noninvasive imaging was evaluated demonstrating adequately sized cephalic vein in the left upper extremity as well as bilateral basilic veins. The artery was of sufficient size  ASSESSMENT/PLAN:  DEKLIN BIELER is a 70 y.o. male who presents with end stage renal disease  Based on vein mapping and examination, this patient's best permanent access option is: left brachiocephalic fistula.  The history of swelling is of concern, and therefore prior to fistula creation, I will perform a venogram to ensure no central stenosis, if stenosis present, will plasty prior to proceeding with fistula. I had an extensive discussion with this patient in regards to the nature of access surgery, including risk, benefits, and alternatives.    The patient is aware that the risks of access surgery include but are not limited to: bleeding, infection, steal syndrome, nerve damage, ischemic monomelic neuropathy, failure of access to mature, complications related to venous hypertension, and possible need for additional access procedures in the future. The patient has agreed to proceed with left brachiocephalic fistula and will be scheduled for central venogram, left brachiocephalic fistula  Greg John, MD [facility] [phone]

## 2021-05-30 NOTE — H&P (View-Only) (Signed)
Note   Patient was seen in preoperative holding with plan for left brachiocephalic fistula. He was doing well with no complaints No changes in physical exam We discussed venogram prior to fistula creation in an effort to ensure he does not have left upper extremity swelling, which is happened in the past. He also stated his right arm fistula has been bothering him.  I said we could discuss excision of the thrombosed fistula in the office at his next visit.  After discussing the risk and benefits of left upper extremity venogram and left sided AV fistula, however elected to proceed.  Greg John MD  CC:  ESRD Requesting Provider:  No ref. provider found  HPI: [name] is a Right handed 70 y.o. (Jul 09, 1951) male with kidney disease who presents at the request of No ref. provider found for permanent HD access.  [name] has had multiple prior access procedures including bilateral radiocephalic fistulas which did not mature, followed by right sided brachiocephalic fistula.  This was created in 2013 and used until the patient underwent kidney transplant.  His transplant was deemed disadvantaged however lasted for nearly 7 years. Tunneled lines have been placed in right internal jugular vein. Current access is right tunneled HD line.  [name] presents to clinic today accompanied by his wife.  On exam, he was doing well with no complaints.  He understands the role of dialysis as well as fistula creation. [name] did note at the time of left radiocephalic fistula creation his arm had significant edema, and therefore the fistula was ligated.  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   The pt is  on a statin for cholesterol management.  The pt is not on a daily aspirin.   Other AC:   The pt is on medications for hypertension.   The pt is not diabetic. Tobacco hx:  -  Past Medical History:  Diagnosis Date   Anemia     Anemia    Benign colon polyp 08/11/2010   Clot    STATED TOLD IN APRIL CLOT TO LEFT SHOULDER. 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   HTN (hypertension)    Hyperlipidemia    Renal failure     Past Surgical History:  Procedure Laterality Date   AV FISTULA PLACEMENT  01/19/2012   Procedure: ARTERIOVENOUS (AV) FISTULA CREATION;  Surgeon: Greg Somerset, MD;  Location: [facility];  Service: Vascular;  Laterality: Right;  Right Brachio-cephalic arteriovenous fistula   AV FISTULA PLACEMENT, RADIOCEPHALIC  01/00/71   Right arm   INSERTION OF DIALYSIS CATHETER  01/19/2012   Procedure: INSERTION OF DIALYSIS CATHETER;  Surgeon: Greg Ankeny, MD;  Location: [facility] OR;  Service: Vascular;  Laterality: N/A;  right internal jugular vein   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    Social History   Socioeconomic History   Marital status: Married    Spouse name: Not on file   Number of children: 5   Years of education: Not on file   Highest education level: Not on file  Occupational History   Occupation: PhD Lobbyist: [employer]  Tobacco Use   Smoking status: Never   Smokeless tobacco: Never  Vaping Use   Vaping Use: Never used  Substance and Sexual Activity   Alcohol use: No    Comment: 1 drink a month or non   Drug use: No   Sexual activity: Yes    Birth control/protection: Condom  Other Topics Concern   Not on file  Social History Narrative   Regular Exercise -  YES         Social Determinants of Health   Financial Resource Strain: Not on file  Food Insecurity: Not on file  Transportation Needs: Not on file  Physical Activity: Not on file  Stress: Not on file  Social Connections: Not on file  Intimate Partner Violence: Not on file    Family History  Problem Relation Age of Onset   Diabetes Father    Colon cancer Neg Hx    Esophageal cancer  Neg Hx    Stomach cancer Neg Hx    Rectal cancer Neg Hx     Current Facility-Administered Medications  Medication Dose Route Frequency Provider Last Rate Last Admin   0.9 %  sodium chloride infusion   Intravenous Continuous Greg John, MD 10 mL/hr at 05/30/21 3754 Restarted at 05/30/21 1052   amisulpride (BARHEMSYS) injection 10 mg  10 mg Intravenous Once PRN Greg Boston, MD       chlorhexidine (HIBICLENS) 4 % liquid 4 application  60 mL Topical Once Greg John, MD       And   [START ON 05/31/2021] chlorhexidine (HIBICLENS) 4 % liquid 4 application  60 mL Topical Once Greg John, MD       fentaNYL (SUBLIMAZE) injection 25-50 mcg  25-50 mcg Intravenous Q5 min PRN Greg Boston, MD       promethazine (PHENERGAN) injection 6.25-12.5 mg  6.25-12.5 mg Intravenous Q15 min PRN Greg Boston, MD       Current Outpatient Medications  Medication Sig Dispense Refill   acetaminophen (TYLENOL) 500 MG tablet Take 1,000 mg by mouth.     amLODipine (NORVASC) 5 MG tablet Take 5 mg by mouth daily.     carvedilol (COREG) 25 MG tablet Take 25 mg by mouth 2 (two) times daily with a meal.     Cinacalcet HCl (SENSIPAR PO) Take 60 mg by mouth daily.     losartan (COZAAR) 25 MG tablet Take 25 mg by mouth every evening.     sevelamer carbonate (RENVELA) 800 MG tablet Take 1,600 mg by mouth 3 (three) times daily with meals.     ACCU-CHEK SOFTCLIX LANCETS lancets 1 each by Other route 2 (two) times daily. Use to check blood sugars twice a day 100 each 3   Blood Glucose Monitoring Suppl (ACCU-CHEK AVIVA PLUS) w/Device KIT Use as directed to check blood sugars 1 kit 0   glucose blood test strip Use TID 100 each 12    No Active Allergies    REVIEW OF SYSTEMS:   _0  denotes positive finding, _1  denotes negative finding Cardiac  Comments:  Chest pain or chest pressure:    Shortness of breath upon exertion:    Short of breath when lying flat:    Irregular heart rhythm:        Vascular     Pain in calf, thigh, or hip brought on by ambulation:    Pain in feet at night that wakes you up from your sleep:     Blood clot in your veins:    Leg swelling:         Pulmonary    Oxygen at home:  Productive cough:     Wheezing:         Neurologic    Sudden weakness in arms or legs:     Sudden numbness in arms or legs:     Sudden onset of difficulty speaking or slurred speech:    Temporary loss of vision in one eye:     Problems with dizziness:         Gastrointestinal    Blood in stool:     Vomited blood:         Genitourinary    Burning when urinating:     Blood in urine:        Psychiatric    Major depression:         Hematologic    Bleeding problems:    Problems with blood clotting too easily:        Skin    Rashes or ulcers:        Constitutional    Fever or chills:      PHYSICAL EXAMINATION:  Vitals:   05/30/21 1104 05/30/21 1119 05/30/21 1134 05/30/21 1149  BP: (!) 161/65 (!) 165/88 (!) 165/75 (!) 160/83  Pulse: (!) 56 (!) 52 (!) 53 (!) 53  Resp: _0 Temp: 97.6 F (36.4 C)     TempSrc:      SpO2: 100% 100% 98% 98%  Weight:      Height:        General:  WDWN in NAD; vital signs documented above Gait: Not observed HENT: WNL, normocephalic Pulmonary: normal non-labored breathing , without Rales, rhonchi,  wheezing Cardiac: regular HR,  Abdomen: soft, NT, no masses Skin: without rashes Vascular Exam/Pulses:  Right Left  Radial 2+ (normal) 2+ (normal)  Ulnar 2+ (normal) 2+ (normal)                   Extremities: without ischemic changes, without Gangrene , without cellulitis; without open wounds;  Musculoskeletal: no muscle wasting or atrophy  Neurologic: A&O X 3;  No focal weakness or paresthesias are detected Psychiatric:  The pt has Normal affect.   Non-Invasive Vascular Imaging:   Noninvasive imaging was evaluated demonstrating adequately sized cephalic vein in the left upper extremity as well as bilateral basilic  veins. The artery was of sufficient size  ASSESSMENT/PLAN:  FREMAN LAPAGE is a 70 y.o. male who presents with end stage renal disease  Based on vein mapping and examination, this patient's best permanent access option is: left brachiocephalic fistula.  The history of swelling is of concern, and therefore prior to fistula creation, I will perform a venogram to ensure no central stenosis, if stenosis present, will plasty prior to proceeding with fistula. I had an extensive discussion with this patient in regards to the nature of access surgery, including risk, benefits, and alternatives.   The patient is aware that the risks of access surgery include but are not limited to: bleeding, infection, steal syndrome, nerve damage, ischemic monomelic neuropathy, failure of access to mature, complications related to venous hypertension, and possible need for additional access procedures in the future. The patient has agreed to proceed with left brachiocephalic fistula and will be scheduled for central venogram, left brachiocephalic fistula  Greg John, MD [facility] [phone]

## 2021-05-30 NOTE — Progress Notes (Deleted)
Note   Patient was seen in preoperative holding with plan for left brachiocephalic fistula. He was doing well with no complaints No changes in physical exam We discussed venogram prior to fistula creation in an effort to ensure he does not have left upper extremity swelling, which is happened in the past. He also stated his right arm fistula has been bothering him.  I said we could discuss excision of the thrombosed fistula in the office at his next visit.  After discussing the risk and benefits of left upper extremity venogram and left sided AV fistula, however elected to proceed.  Broadus John MD  CC:  ESRD Requesting Provider:  No ref. provider found  HPI: ADRIENE PADULA is a Right handed 70 y.o. (Jul 09, 1951) male with kidney disease who presents at the request of No ref. provider found for permanent HD access.  Foch has had multiple prior access procedures including bilateral radiocephalic fistulas which did not mature, followed by right sided brachiocephalic fistula.  This was created in 2013 and used until the patient underwent kidney transplant.  His transplant was deemed disadvantaged however lasted for nearly 7 years. Tunneled lines have been placed in right internal jugular vein. Current access is right tunneled HD line.  Antrone presents to clinic today accompanied by his wife.  On exam, he was doing well with no complaints.  He understands the role of dialysis as well as fistula creation. Hudson did note at the time of left radiocephalic fistula creation his arm had significant edema, and therefore the fistula was ligated.  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   The pt is  on a statin for cholesterol management.  The pt is not on a daily aspirin.   Other AC:   The pt is on medications for hypertension.   The pt is not diabetic. Tobacco hx:  -  Past Medical History:  Diagnosis Date   Anemia     Anemia    Benign colon polyp 08/11/2010   Clot    STATED TOLD IN APRIL CLOT TO LEFT SHOULDER. 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   HTN (hypertension)    Hyperlipidemia    Renal failure     Past Surgical History:  Procedure Laterality Date   AV FISTULA PLACEMENT  01/19/2012   Procedure: ARTERIOVENOUS (AV) FISTULA CREATION;  Surgeon: Conrad Delmar, MD;  Location: [facility];  Service: Vascular;  Laterality: Right;  Right Brachio-cephalic arteriovenous fistula   AV FISTULA PLACEMENT, RADIOCEPHALIC  01/00/71   Right arm   INSERTION OF DIALYSIS CATHETER  01/19/2012   Procedure: INSERTION OF DIALYSIS CATHETER;  Surgeon: Conrad , MD;  Location: [facility] OR;  Service: Vascular;  Laterality: N/A;  right internal jugular vein   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    Social History   Socioeconomic History   Marital status: Married    Spouse name: Not on file   Number of children: 5   Years of education: Not on file   Highest education level: Not on file  Occupational History   Occupation: PhD Lobbyist: [employer]  Tobacco Use   Smoking status: Never   Smokeless tobacco: Never  Vaping Use   Vaping Use: Never used  Substance and Sexual Activity   Alcohol use: No    Comment: 1 drink a month or non   Drug use: No   Sexual activity: Yes    Birth control/protection: Condom  Other Topics Concern   Not on file  Social History Narrative   Regular Exercise -  YES         Social Determinants of Health   Financial Resource Strain: Not on file  Food Insecurity: Not on file  Transportation Needs: Not on file  Physical Activity: Not on file  Stress: Not on file  Social Connections: Not on file  Intimate Partner Violence: Not on file    Family History  Problem Relation Age of Onset   Diabetes Father    Colon cancer Neg Hx    Esophageal cancer  Neg Hx    Stomach cancer Neg Hx    Rectal cancer Neg Hx     Current Facility-Administered Medications  Medication Dose Route Frequency Provider Last Rate Last Admin   0.9 %  sodium chloride infusion   Intravenous Continuous Broadus John, MD 10 mL/hr at 05/30/21 3754 Restarted at 05/30/21 1052   amisulpride (BARHEMSYS) injection 10 mg  10 mg Intravenous Once PRN Duane Boston, MD       chlorhexidine (HIBICLENS) 4 % liquid 4 application  60 mL Topical Once Broadus John, MD       And   [START ON 05/31/2021] chlorhexidine (HIBICLENS) 4 % liquid 4 application  60 mL Topical Once Broadus John, MD       fentaNYL (SUBLIMAZE) injection 25-50 mcg  25-50 mcg Intravenous Q5 min PRN Duane Boston, MD       promethazine (PHENERGAN) injection 6.25-12.5 mg  6.25-12.5 mg Intravenous Q15 min PRN Duane Boston, MD       Current Outpatient Medications  Medication Sig Dispense Refill   acetaminophen (TYLENOL) 500 MG tablet Take 1,000 mg by mouth.     amLODipine (NORVASC) 5 MG tablet Take 5 mg by mouth daily.     carvedilol (COREG) 25 MG tablet Take 25 mg by mouth 2 (two) times daily with a meal.     Cinacalcet HCl (SENSIPAR PO) Take 60 mg by mouth daily.     losartan (COZAAR) 25 MG tablet Take 25 mg by mouth every evening.     sevelamer carbonate (RENVELA) 800 MG tablet Take 1,600 mg by mouth 3 (three) times daily with meals.     ACCU-CHEK SOFTCLIX LANCETS lancets 1 each by Other route 2 (two) times daily. Use to check blood sugars twice a day 100 each 3   Blood Glucose Monitoring Suppl (ACCU-CHEK AVIVA PLUS) w/Device KIT Use as directed to check blood sugars 1 kit 0   glucose blood test strip Use TID 100 each 12    No Active Allergies    REVIEW OF SYSTEMS:   _0  denotes positive finding, _1  denotes negative finding Cardiac  Comments:  Chest pain or chest pressure:    Shortness of breath upon exertion:    Short of breath when lying flat:    Irregular heart rhythm:        Vascular     Pain in calf, thigh, or hip brought on by ambulation:    Pain in feet at night that wakes you up from your sleep:     Blood clot in your veins:    Leg swelling:         Pulmonary    Oxygen at home:  Productive cough:     Wheezing:         Neurologic    Sudden weakness in arms or legs:     Sudden numbness in arms or legs:     Sudden onset of difficulty speaking or slurred speech:    Temporary loss of vision in one eye:     Problems with dizziness:         Gastrointestinal    Blood in stool:     Vomited blood:         Genitourinary    Burning when urinating:     Blood in urine:        Psychiatric    Major depression:         Hematologic    Bleeding problems:    Problems with blood clotting too easily:        Skin    Rashes or ulcers:        Constitutional    Fever or chills:      PHYSICAL EXAMINATION:  Vitals:   05/30/21 1104 05/30/21 1119 05/30/21 1134 05/30/21 1149  BP: (!) 161/65 (!) 165/88 (!) 165/75 (!) 160/83  Pulse: (!) 56 (!) 52 (!) 53 (!) 53  Resp: _0 Temp: 97.6 F (36.4 C)     TempSrc:      SpO2: 100% 100% 98% 98%  Weight:      Height:        General:  WDWN in NAD; vital signs documented above Gait: Not observed HENT: WNL, normocephalic Pulmonary: normal non-labored breathing , without Rales, rhonchi,  wheezing Cardiac: regular HR,  Abdomen: soft, NT, no masses Skin: without rashes Vascular Exam/Pulses:  Right Left  Radial 2+ (normal) 2+ (normal)  Ulnar 2+ (normal) 2+ (normal)                   Extremities: without ischemic changes, without Gangrene , without cellulitis; without open wounds;  Musculoskeletal: no muscle wasting or atrophy  Neurologic: A&O X 3;  No focal weakness or paresthesias are detected Psychiatric:  The pt has Normal affect.   Non-Invasive Vascular Imaging:   Noninvasive imaging was evaluated demonstrating adequately sized cephalic vein in the left upper extremity as well as bilateral basilic  veins. The artery was of sufficient size  ASSESSMENT/PLAN:  FREMAN LAPAGE is a 70 y.o. male who presents with end stage renal disease  Based on vein mapping and examination, this patient's best permanent access option is: left brachiocephalic fistula.  The history of swelling is of concern, and therefore prior to fistula creation, I will perform a venogram to ensure no central stenosis, if stenosis present, will plasty prior to proceeding with fistula. I had an extensive discussion with this patient in regards to the nature of access surgery, including risk, benefits, and alternatives.   The patient is aware that the risks of access surgery include but are not limited to: bleeding, infection, steal syndrome, nerve damage, ischemic monomelic neuropathy, failure of access to mature, complications related to venous hypertension, and possible need for additional access procedures in the future. The patient has agreed to proceed with left brachiocephalic fistula and will be scheduled for central venogram, left brachiocephalic fistula  Broadus John, MD [facility] [phone]

## 2021-05-31 ENCOUNTER — Encounter (HOSPITAL_COMMUNITY): Payer: Self-pay | Admitting: Vascular Surgery

## 2021-05-31 DIAGNOSIS — D509 Iron deficiency anemia, unspecified: Secondary | ICD-10-CM | POA: Diagnosis not present

## 2021-05-31 DIAGNOSIS — N2581 Secondary hyperparathyroidism of renal origin: Secondary | ICD-10-CM | POA: Diagnosis not present

## 2021-05-31 DIAGNOSIS — D631 Anemia in chronic kidney disease: Secondary | ICD-10-CM | POA: Diagnosis not present

## 2021-05-31 DIAGNOSIS — Z992 Dependence on renal dialysis: Secondary | ICD-10-CM | POA: Diagnosis not present

## 2021-05-31 DIAGNOSIS — N186 End stage renal disease: Secondary | ICD-10-CM | POA: Diagnosis not present

## 2021-05-31 DIAGNOSIS — D688 Other specified coagulation defects: Secondary | ICD-10-CM | POA: Diagnosis not present

## 2021-05-31 NOTE — Addendum Note (Signed)
Addendum  created 05/31/21 1404 by Duane Boston, MD   Clinical Note Signed, Intraprocedure Blocks edited, SmartForm saved

## 2021-06-03 DIAGNOSIS — N2581 Secondary hyperparathyroidism of renal origin: Secondary | ICD-10-CM | POA: Diagnosis not present

## 2021-06-03 DIAGNOSIS — D688 Other specified coagulation defects: Secondary | ICD-10-CM | POA: Diagnosis not present

## 2021-06-03 DIAGNOSIS — D631 Anemia in chronic kidney disease: Secondary | ICD-10-CM | POA: Diagnosis not present

## 2021-06-03 DIAGNOSIS — Z992 Dependence on renal dialysis: Secondary | ICD-10-CM | POA: Diagnosis not present

## 2021-06-03 DIAGNOSIS — D509 Iron deficiency anemia, unspecified: Secondary | ICD-10-CM | POA: Diagnosis not present

## 2021-06-03 DIAGNOSIS — N186 End stage renal disease: Secondary | ICD-10-CM | POA: Diagnosis not present

## 2021-06-05 ENCOUNTER — Ambulatory Visit (HOSPITAL_COMMUNITY)
Admission: RE | Admit: 2021-06-05 | Discharge: 2021-06-05 | Disposition: A | Discharge status: Home / Self Care | Payer: Medicare Other | Attending: Vascular Surgery | Admitting: Vascular Surgery

## 2021-06-05 ENCOUNTER — Other Ambulatory Visit: Payer: Self-pay

## 2021-06-05 ENCOUNTER — Encounter (HOSPITAL_COMMUNITY)
Admission: RE | Disposition: A | Discharge status: Home / Self Care | Payer: Self-pay | Source: Home / Self Care | Attending: Vascular Surgery

## 2021-06-05 DIAGNOSIS — Z539 Procedure and treatment not carried out, unspecified reason: Secondary | ICD-10-CM | POA: Diagnosis not present

## 2021-06-05 LAB — POCT I-STAT, CHEM 8
BUN: 59 mg/dL — ABNORMAL HIGH (ref 8–23)
Calcium, Ion: 1.1 mmol/L — ABNORMAL LOW (ref 1.15–1.40)
Chloride: 97 mmol/L — ABNORMAL LOW (ref 98–111)
Creatinine, Ser: 12.5 mg/dL — ABNORMAL HIGH (ref 0.61–1.24)
Glucose, Bld: 86 mg/dL (ref 70–99)
HCT: 29 % — ABNORMAL LOW (ref 39.0–52.0)
Hemoglobin: 9.9 g/dL — ABNORMAL LOW (ref 13.0–17.0)
Potassium: 5.1 mmol/L (ref 3.5–5.1)
Sodium: 138 mmol/L (ref 135–145)
TCO2: 31 mmol/L (ref 22–32)

## 2021-06-05 SURGERY — UPPER EXTREMITY VENOGRAPHY
Anesthesia: LOCAL | Laterality: Right

## 2021-06-05 MED ORDER — SODIUM CHLORIDE 0.9% FLUSH: 3.0000 mL | Freq: Two times a day (BID) | INTRAVENOUS | Status: DC

## 2021-06-05 MED ORDER — SODIUM CHLORIDE 0.9% FLUSH: 3.0000 mL | INTRAVENOUS | Status: DC | PRN

## 2021-06-05 MED ORDER — SODIUM CHLORIDE 0.9 % IV SOLN: 250.0000 mL | INTRAVENOUS | Status: DC | PRN

## 2021-06-07 DIAGNOSIS — N186 End stage renal disease: Secondary | ICD-10-CM | POA: Diagnosis not present

## 2021-06-07 DIAGNOSIS — Z992 Dependence on renal dialysis: Secondary | ICD-10-CM | POA: Diagnosis not present

## 2021-06-07 DIAGNOSIS — D688 Other specified coagulation defects: Secondary | ICD-10-CM | POA: Diagnosis not present

## 2021-06-07 DIAGNOSIS — D631 Anemia in chronic kidney disease: Secondary | ICD-10-CM | POA: Diagnosis not present

## 2021-06-07 DIAGNOSIS — N2581 Secondary hyperparathyroidism of renal origin: Secondary | ICD-10-CM | POA: Diagnosis not present

## 2021-06-07 DIAGNOSIS — D509 Iron deficiency anemia, unspecified: Secondary | ICD-10-CM | POA: Diagnosis not present

## 2021-06-08 DIAGNOSIS — N2581 Secondary hyperparathyroidism of renal origin: Secondary | ICD-10-CM | POA: Diagnosis not present

## 2021-06-08 DIAGNOSIS — Z992 Dependence on renal dialysis: Secondary | ICD-10-CM | POA: Diagnosis not present

## 2021-06-08 DIAGNOSIS — D631 Anemia in chronic kidney disease: Secondary | ICD-10-CM | POA: Diagnosis not present

## 2021-06-08 DIAGNOSIS — N186 End stage renal disease: Secondary | ICD-10-CM | POA: Diagnosis not present

## 2021-06-08 DIAGNOSIS — D509 Iron deficiency anemia, unspecified: Secondary | ICD-10-CM | POA: Diagnosis not present

## 2021-06-08 DIAGNOSIS — D688 Other specified coagulation defects: Secondary | ICD-10-CM | POA: Diagnosis not present

## 2021-06-10 DIAGNOSIS — D509 Iron deficiency anemia, unspecified: Secondary | ICD-10-CM | POA: Diagnosis not present

## 2021-06-10 DIAGNOSIS — T8610 Unspecified complication of kidney transplant: Secondary | ICD-10-CM | POA: Diagnosis not present

## 2021-06-10 DIAGNOSIS — D688 Other specified coagulation defects: Secondary | ICD-10-CM | POA: Diagnosis not present

## 2021-06-10 DIAGNOSIS — N186 End stage renal disease: Secondary | ICD-10-CM | POA: Diagnosis not present

## 2021-06-10 DIAGNOSIS — D631 Anemia in chronic kidney disease: Secondary | ICD-10-CM | POA: Diagnosis not present

## 2021-06-10 DIAGNOSIS — N2581 Secondary hyperparathyroidism of renal origin: Secondary | ICD-10-CM | POA: Diagnosis not present

## 2021-06-10 DIAGNOSIS — Z992 Dependence on renal dialysis: Secondary | ICD-10-CM | POA: Diagnosis not present

## 2021-06-11 ENCOUNTER — Encounter (HOSPITAL_COMMUNITY)
Admission: RE | Disposition: A | Discharge status: Home / Self Care | Payer: Self-pay | Source: Home / Self Care | Attending: Surgery

## 2021-06-11 ENCOUNTER — Other Ambulatory Visit: Payer: Self-pay

## 2021-06-11 ENCOUNTER — Ambulatory Visit (HOSPITAL_COMMUNITY)
Admission: RE | Admit: 2021-06-11 | Discharge: 2021-06-11 | Disposition: A | Discharge status: Home / Self Care | Payer: Medicare Other | Attending: Surgery | Admitting: Surgery

## 2021-06-11 DIAGNOSIS — Z833 Family history of diabetes mellitus: Secondary | ICD-10-CM | POA: Diagnosis not present

## 2021-06-11 DIAGNOSIS — Z940 Kidney transplant status: Secondary | ICD-10-CM | POA: Diagnosis not present

## 2021-06-11 DIAGNOSIS — N186 End stage renal disease: Secondary | ICD-10-CM | POA: Insufficient documentation

## 2021-06-11 DIAGNOSIS — Z79899 Other long term (current) drug therapy: Secondary | ICD-10-CM | POA: Diagnosis not present

## 2021-06-11 DIAGNOSIS — Z992 Dependence on renal dialysis: Secondary | ICD-10-CM | POA: Insufficient documentation

## 2021-06-11 DIAGNOSIS — I120 Hypertensive chronic kidney disease with stage 5 chronic kidney disease or end stage renal disease: Secondary | ICD-10-CM | POA: Insufficient documentation

## 2021-06-11 DIAGNOSIS — E1122 Type 2 diabetes mellitus with diabetic chronic kidney disease: Secondary | ICD-10-CM | POA: Diagnosis not present

## 2021-06-11 DIAGNOSIS — Z01810 Encounter for preprocedural cardiovascular examination: Secondary | ICD-10-CM | POA: Diagnosis not present

## 2021-06-11 HISTORY — PX: UPPER EXTREMITY VENOGRAPHY: CATH118272

## 2021-06-11 LAB — POCT I-STAT, CHEM 8
BUN: 31 mg/dL — ABNORMAL HIGH (ref 8–23)
Calcium, Ion: 1.04 mmol/L — ABNORMAL LOW (ref 1.15–1.40)
Chloride: 99 mmol/L (ref 98–111)
Creatinine, Ser: 9.9 mg/dL — ABNORMAL HIGH (ref 0.61–1.24)
Glucose, Bld: 88 mg/dL (ref 70–99)
HCT: 32 % — ABNORMAL LOW (ref 39.0–52.0)
Hemoglobin: 10.9 g/dL — ABNORMAL LOW (ref 13.0–17.0)
Potassium: 4.9 mmol/L (ref 3.5–5.1)
Sodium: 140 mmol/L (ref 135–145)
TCO2: 34 mmol/L — ABNORMAL HIGH (ref 22–32)

## 2021-06-11 SURGERY — UPPER EXTREMITY VENOGRAPHY
Anesthesia: LOCAL | Laterality: Right

## 2021-06-11 MED ORDER — SODIUM CHLORIDE 0.9% FLUSH: 3.0000 mL | Freq: Two times a day (BID) | INTRAVENOUS | Status: DC

## 2021-06-11 MED ORDER — SODIUM CHLORIDE 0.9% FLUSH: 3.0000 mL | INTRAVENOUS | Status: DC | PRN

## 2021-06-11 MED ORDER — SODIUM CHLORIDE 0.9 % IV SOLN: 250.0000 mL | INTRAVENOUS | Status: DC | PRN

## 2021-06-11 MED ORDER — IODIXANOL 320 MG/ML IV SOLN
INTRAVENOUS | Status: DC | PRN
  Administered 2021-06-11: 60 mL

## 2021-06-11 NOTE — Interval H&P Note (Signed)
History and Physical Interval Note:  06/11/2021 10:44 AM  [name]  has presented today for surgery, with the diagnosis of end stage renal.  The various methods of treatment have been discussed with the patient and family. After consideration of risks, benefits and other options for treatment, the patient has consented to  Procedure(s): UPPER EXTREMITY VENOGRAPHY (Right) as a surgical intervention.  The patient's history has been reviewed, patient examined, no change in status, stable for surgery.  I have reviewed the patient's chart and labs.  Questions were answered to the patient's satisfaction.     [name]

## 2021-06-11 NOTE — Op Note (Addendum)
    Patient name: [name] MRN: [id number] DOB: September 02, 1950 Sex: male  06/11/2021 Pre-operative Diagnosis: ESRD Post-operative diagnosis:  Same Surgeon:  [name] Procedure Performed:  1.  Right arm and central venogram   Indications:  The patient is here for access planning  Procedure:  The patient was identified in the holding area and taken to room 8.  The patient was then placed supine on the table and prepped and draped in the usual sterile fashion.  A time out was called.  Contrast was injected intot he IV which was placed in short stay in the hand  Findings:  The right brachial and axillary veins are patent in the upper arm.  The subclavian vein distal to the central venous catheter is patent up to the catheter and then there does appear to be a significant stenosis around the dialysis catheter.  There is contrast that drains into the right atrium.    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  This does appear to be a high-grade stenosis but contrast does go through this and into the right atrium.    Theotis Burrow, M.D., [facility] [place] Office: [phone] Pager:  [phone]

## 2021-06-12 ENCOUNTER — Encounter (HOSPITAL_COMMUNITY): Payer: Self-pay | Admitting: Surgery

## 2021-06-12 DIAGNOSIS — N186 End stage renal disease: Secondary | ICD-10-CM | POA: Diagnosis not present

## 2021-06-12 DIAGNOSIS — D688 Other specified coagulation defects: Secondary | ICD-10-CM | POA: Diagnosis not present

## 2021-06-12 DIAGNOSIS — D509 Iron deficiency anemia, unspecified: Secondary | ICD-10-CM | POA: Diagnosis not present

## 2021-06-12 DIAGNOSIS — N2581 Secondary hyperparathyroidism of renal origin: Secondary | ICD-10-CM | POA: Diagnosis not present

## 2021-06-12 DIAGNOSIS — D631 Anemia in chronic kidney disease: Secondary | ICD-10-CM | POA: Diagnosis not present

## 2021-06-12 DIAGNOSIS — Z992 Dependence on renal dialysis: Secondary | ICD-10-CM | POA: Diagnosis not present

## 2021-06-13 DIAGNOSIS — N35916 Unspecified urethral stricture, male, overlapping sites: Secondary | ICD-10-CM | POA: Diagnosis not present

## 2021-06-13 DIAGNOSIS — Z7682 Awaiting organ transplant status: Secondary | ICD-10-CM | POA: Diagnosis not present

## 2021-06-13 DIAGNOSIS — T8612 Kidney transplant failure: Secondary | ICD-10-CM | POA: Diagnosis not present

## 2021-06-13 DIAGNOSIS — D84821 Immunodeficiency due to drugs: Secondary | ICD-10-CM | POA: Diagnosis not present

## 2021-06-13 DIAGNOSIS — Z992 Dependence on renal dialysis: Secondary | ICD-10-CM | POA: Diagnosis not present

## 2021-06-13 DIAGNOSIS — E1121 Type 2 diabetes mellitus with diabetic nephropathy: Secondary | ICD-10-CM | POA: Diagnosis not present

## 2021-06-13 DIAGNOSIS — N35919 Unspecified urethral stricture, male, unspecified site: Secondary | ICD-10-CM | POA: Diagnosis not present

## 2021-06-13 DIAGNOSIS — Z01810 Encounter for preprocedural cardiovascular examination: Secondary | ICD-10-CM | POA: Diagnosis not present

## 2021-06-13 DIAGNOSIS — N186 End stage renal disease: Secondary | ICD-10-CM | POA: Diagnosis not present

## 2021-06-13 DIAGNOSIS — E1122 Type 2 diabetes mellitus with diabetic chronic kidney disease: Secondary | ICD-10-CM | POA: Diagnosis not present

## 2021-06-13 DIAGNOSIS — Z79621 Long term (current) use of calcineurin inhibitor: Secondary | ICD-10-CM | POA: Diagnosis not present

## 2021-06-13 DIAGNOSIS — R9431 Abnormal electrocardiogram [ECG] [EKG]: Secondary | ICD-10-CM | POA: Diagnosis not present

## 2021-06-13 DIAGNOSIS — M109 Gout, unspecified: Secondary | ICD-10-CM | POA: Diagnosis not present

## 2021-06-13 DIAGNOSIS — E7800 Pure hypercholesterolemia, unspecified: Secondary | ICD-10-CM | POA: Diagnosis not present

## 2021-06-13 DIAGNOSIS — N35014 Post-traumatic urethral stricture, male, unspecified: Secondary | ICD-10-CM | POA: Diagnosis not present

## 2021-06-13 DIAGNOSIS — Z01818 Encounter for other preprocedural examination: Secondary | ICD-10-CM | POA: Diagnosis not present

## 2021-06-13 DIAGNOSIS — I10 Essential (primary) hypertension: Secondary | ICD-10-CM | POA: Diagnosis not present

## 2021-06-13 DIAGNOSIS — I440 Atrioventricular block, first degree: Secondary | ICD-10-CM | POA: Diagnosis not present

## 2021-06-13 DIAGNOSIS — Z1159 Encounter for screening for other viral diseases: Secondary | ICD-10-CM | POA: Diagnosis not present

## 2021-06-14 DIAGNOSIS — D631 Anemia in chronic kidney disease: Secondary | ICD-10-CM | POA: Diagnosis not present

## 2021-06-14 DIAGNOSIS — N186 End stage renal disease: Secondary | ICD-10-CM | POA: Diagnosis not present

## 2021-06-14 DIAGNOSIS — D509 Iron deficiency anemia, unspecified: Secondary | ICD-10-CM | POA: Diagnosis not present

## 2021-06-14 DIAGNOSIS — Z992 Dependence on renal dialysis: Secondary | ICD-10-CM | POA: Diagnosis not present

## 2021-06-14 DIAGNOSIS — N2581 Secondary hyperparathyroidism of renal origin: Secondary | ICD-10-CM | POA: Diagnosis not present

## 2021-06-14 DIAGNOSIS — D688 Other specified coagulation defects: Secondary | ICD-10-CM | POA: Diagnosis not present

## 2021-06-17 ENCOUNTER — Encounter (HOSPITAL_COMMUNITY): Payer: Self-pay | Admitting: Surgery

## 2021-06-17 DIAGNOSIS — Z992 Dependence on renal dialysis: Secondary | ICD-10-CM | POA: Diagnosis not present

## 2021-06-17 DIAGNOSIS — N186 End stage renal disease: Secondary | ICD-10-CM | POA: Diagnosis not present

## 2021-06-17 DIAGNOSIS — N2581 Secondary hyperparathyroidism of renal origin: Secondary | ICD-10-CM | POA: Diagnosis not present

## 2021-06-17 DIAGNOSIS — D631 Anemia in chronic kidney disease: Secondary | ICD-10-CM | POA: Diagnosis not present

## 2021-06-17 DIAGNOSIS — D509 Iron deficiency anemia, unspecified: Secondary | ICD-10-CM | POA: Diagnosis not present

## 2021-06-17 DIAGNOSIS — D688 Other specified coagulation defects: Secondary | ICD-10-CM | POA: Diagnosis not present

## 2021-06-19 DIAGNOSIS — N2581 Secondary hyperparathyroidism of renal origin: Secondary | ICD-10-CM | POA: Diagnosis not present

## 2021-06-19 DIAGNOSIS — Z992 Dependence on renal dialysis: Secondary | ICD-10-CM | POA: Diagnosis not present

## 2021-06-19 DIAGNOSIS — D631 Anemia in chronic kidney disease: Secondary | ICD-10-CM | POA: Diagnosis not present

## 2021-06-19 DIAGNOSIS — N186 End stage renal disease: Secondary | ICD-10-CM | POA: Diagnosis not present

## 2021-06-19 DIAGNOSIS — D509 Iron deficiency anemia, unspecified: Secondary | ICD-10-CM | POA: Diagnosis not present

## 2021-06-19 DIAGNOSIS — D688 Other specified coagulation defects: Secondary | ICD-10-CM | POA: Diagnosis not present

## 2021-06-21 DIAGNOSIS — N186 End stage renal disease: Secondary | ICD-10-CM | POA: Diagnosis not present

## 2021-06-21 DIAGNOSIS — D509 Iron deficiency anemia, unspecified: Secondary | ICD-10-CM | POA: Diagnosis not present

## 2021-06-21 DIAGNOSIS — N2581 Secondary hyperparathyroidism of renal origin: Secondary | ICD-10-CM | POA: Diagnosis not present

## 2021-06-21 DIAGNOSIS — D631 Anemia in chronic kidney disease: Secondary | ICD-10-CM | POA: Diagnosis not present

## 2021-06-21 DIAGNOSIS — Z992 Dependence on renal dialysis: Secondary | ICD-10-CM | POA: Diagnosis not present

## 2021-06-21 DIAGNOSIS — D688 Other specified coagulation defects: Secondary | ICD-10-CM | POA: Diagnosis not present

## 2021-06-24 DIAGNOSIS — Z992 Dependence on renal dialysis: Secondary | ICD-10-CM | POA: Diagnosis not present

## 2021-06-24 DIAGNOSIS — N2581 Secondary hyperparathyroidism of renal origin: Secondary | ICD-10-CM | POA: Diagnosis not present

## 2021-06-24 DIAGNOSIS — D509 Iron deficiency anemia, unspecified: Secondary | ICD-10-CM | POA: Diagnosis not present

## 2021-06-24 DIAGNOSIS — D688 Other specified coagulation defects: Secondary | ICD-10-CM | POA: Diagnosis not present

## 2021-06-24 DIAGNOSIS — N186 End stage renal disease: Secondary | ICD-10-CM | POA: Diagnosis not present

## 2021-06-24 DIAGNOSIS — D631 Anemia in chronic kidney disease: Secondary | ICD-10-CM | POA: Diagnosis not present

## 2021-06-25 NOTE — Progress Notes (Deleted)
Office Note     CC:  ESRD Requesting Provider:  Janith Lima, MD  HPI: [name] is a Right handed 70 y.o. (September 13, 1950) male with kidney disease who presents at the request of Greg Lima, MD for permanent HD access.  [name] has had multiple prior access procedures including bilateral radiocephalic fistulas which did not mature, followed by right sided brachiocephalic fistula.  This was created in 2013 and used until the patient underwent kidney transplant.  His transplant was deemed disadvantaged however lasted for nearly 7 years. Tunneled lines have been placed in right internal jugular vein. Current access is right tunneled HD line.  On 05/30/2021 [name] was scheduled for left upper extremity fistula.  Prior to fistula, I performed a fistulogram demonstrating central occlusion.  I attempted to cross this lesion, however was unsuccessful.  Therefore, prior to pursuing intervention in the right arm, her and I discussed right upper extremity venogram.  This venogram was performed on 06/11/2021 demonstrating patent axillary axillary vein, subclavian vein, brachiocephalic vein, with some stenosis around the pre-existing catheter.    [name] accompanied by his wife.  On exam, he was doing well with no complaints.  He understands the role of dialysis as well as fistula creation. [name] did note at the time of left radiocephalic fistula creation his arm had significant edema, and therefore the fistula was ligated.  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   The pt is  on a statin for cholesterol management.  The pt is not on a daily aspirin.   Other AC:   The pt is on medications for hypertension.   The pt is not diabetic. Tobacco hx:  -  Past Medical History:  Diagnosis Date   Anemia    Anemia    Benign colon polyp 08/11/2010   Clot    STATED TOLD IN APRIL CLOT TO LEFT SHOULDER. 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   HTN (hypertension)    Hyperlipidemia    Renal failure     Past Surgical History:  Procedure Laterality Date   AV FISTULA PLACEMENT  01/19/2012   Procedure: ARTERIOVENOUS (AV) FISTULA CREATION;  Surgeon: Conrad De Motte, MD;  Location: [facility];  Service: Vascular;  Laterality: Right;  Right Brachio-cephalic arteriovenous fistula   AV FISTULA PLACEMENT, RADIOCEPHALIC  83/38/25   Right arm   INSERTION OF DIALYSIS CATHETER  01/19/2012   Procedure: INSERTION OF DIALYSIS CATHETER;  Surgeon: Conrad , MD;  Location: [facility];  Service: Vascular;  Laterality: N/A;  right internal jugular vein   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   UPPER EXTREMITY VENOGRAPHY Right 06/11/2021   Procedure: UPPER EXTREMITY VENOGRAPHY;  Surgeon: Serafina Mitchell, MD;  Location: [facility];  Service: Cardiovascular;  Laterality: Right;   VENOGRAM Left 05/30/2021   Procedure: DIAGNOSTIC CENTRAL VENOGRAM;  Surgeon: Broadus John, MD;  Location[facility] OR;  Service: Vascular;  Laterality: Left;    Social History   Socioeconomic History   Marital status: Married    Spouse name: Not on file   Number of children: 5   Years of education: Not on file   Highest education level: Not on file  Occupational History   Occupation: PhD Chief Financial Officer  Employer: [employer]  Tobacco Use   Smoking status: Never   Smokeless tobacco: Never  Vaping Use   Vaping Use: Never used  Substance and Sexual Activity   Alcohol use: No    Comment: 1 drink a month or non   Drug use: No   Sexual activity: Yes    Birth control/protection: Condom  Other Topics Concern   Not on file  Social History Narrative   Regular Exercise -  YES         Social Determinants of Health   Financial Resource Strain: Not on file  Food Insecurity: Not on file  Transportation Needs: Not on file   Physical Activity: Not on file  Stress: Not on file  Social Connections: Not on file  Intimate Partner Violence: Not on file    Family History  Problem Relation Age of Onset   Diabetes Father    Colon cancer Neg Hx    Esophageal cancer Neg Hx    Stomach cancer Neg Hx    Rectal cancer Neg Hx     Current Outpatient Medications  Medication Sig Dispense Refill   ACCU-CHEK SOFTCLIX LANCETS lancets 1 each by Other route 2 (two) times daily. Use to check blood sugars twice a day 100 each 3   acetaminophen (TYLENOL) 500 MG tablet Take 1,000 mg by mouth.     amLODipine (NORVASC) 5 MG tablet Take 5 mg by mouth daily.     Blood Glucose Monitoring Suppl (ACCU-CHEK AVIVA PLUS) w/Device KIT Use as directed to check blood sugars 1 kit 0   carvedilol (COREG) 25 MG tablet Take 25 mg by mouth 2 (two) times daily with a meal.     Cinacalcet HCl (SENSIPAR PO) Take 60 mg by mouth daily.     glucose blood test strip Use TID 100 each 12   losartan (COZAAR) 25 MG tablet Take 25 mg by mouth every evening.     sevelamer carbonate (RENVELA) 800 MG tablet Take 1,600 mg by mouth 3 (three) times daily with meals.     No current facility-administered medications for this visit.    No Known Allergies    REVIEW OF SYSTEMS:   [X] denotes positive finding, [ ] denotes negative finding Cardiac  Comments:  Chest pain or chest pressure:    Shortness of breath upon exertion:    Short of breath when lying flat:    Irregular heart rhythm:        Vascular    Pain in calf, thigh, or hip brought on by ambulation:    Pain in feet at night that wakes you up from your sleep:     Blood clot in your veins:    Leg swelling:         Pulmonary    Oxygen at home:    Productive cough:     Wheezing:         Neurologic    Sudden weakness in arms or legs:     Sudden numbness in arms or legs:     Sudden onset of difficulty speaking or slurred speech:    Temporary loss of vision in one eye:     Problems with  dizziness:         Gastrointestinal    Blood in stool:     Vomited blood:         Genitourinary    Burning when urinating:     Blood in urine:        Psychiatric  Major depression:         Hematologic    Bleeding problems:    Problems with blood clotting too easily:        Skin    Rashes or ulcers:        Constitutional    Fever or chills:      PHYSICAL EXAMINATION:  There were no vitals filed for this visit.   General:  WDWN in NAD; vital signs documented above Gait: Not observed HENT: WNL, normocephalic Pulmonary: normal non-labored breathing , without Rales, rhonchi,  wheezing Cardiac: regular HR,  Abdomen: soft, NT, no masses Skin: without rashes Vascular Exam/Pulses:  Right Left  Radial 2+ (normal) 2+ (normal)  Ulnar 2+ (normal) 2+ (normal)                   Extremities: without ischemic changes, without Gangrene , without cellulitis; without open wounds;  Musculoskeletal: no muscle wasting or atrophy  Neurologic: A&O X 3;  No focal weakness or paresthesias are detected Psychiatric:  The pt has Normal affect.   Non-Invasive Vascular Imaging:   Noninvasive imaging was evaluated demonstrating adequately sized cephalic vein in the left upper extremity as well as bilateral basilic veins. The artery was of sufficient size    ASSESSMENT/PLAN:  [name] is a 70 y.o. male who presents with end stage renal disease  Based on vein mapping and examination, this patient's best permanent access option is: left brachiocephalic fistula.  The history of swelling is of concern, and therefore prior to fistula creation, I will perform a venogram to ensure no central stenosis, if stenosis present, will plasty prior to proceeding with fistula. I had an extensive discussion with this patient in regards to the nature of access surgery, including risk, benefits, and alternatives.   The patient is aware that the risks of access surgery include but are not limited  to: bleeding, infection, steal syndrome, nerve damage, ischemic monomelic neuropathy, failure of access to mature, complications related to venous hypertension, and possible need for additional access procedures in the future. The patient has agreed to proceed with left brachiocephalic fistula and will be scheduled for central venogram, left brachiocephalic fistula  Broadus John, MD [facility] [phone]

## 2021-06-26 DIAGNOSIS — N2581 Secondary hyperparathyroidism of renal origin: Secondary | ICD-10-CM | POA: Diagnosis not present

## 2021-06-26 DIAGNOSIS — Z992 Dependence on renal dialysis: Secondary | ICD-10-CM | POA: Diagnosis not present

## 2021-06-26 DIAGNOSIS — D631 Anemia in chronic kidney disease: Secondary | ICD-10-CM | POA: Diagnosis not present

## 2021-06-26 DIAGNOSIS — D509 Iron deficiency anemia, unspecified: Secondary | ICD-10-CM | POA: Diagnosis not present

## 2021-06-26 DIAGNOSIS — N186 End stage renal disease: Secondary | ICD-10-CM | POA: Diagnosis not present

## 2021-06-26 DIAGNOSIS — D688 Other specified coagulation defects: Secondary | ICD-10-CM | POA: Diagnosis not present

## 2021-06-28 ENCOUNTER — Ambulatory Visit: Payer: Medicare Other | Admitting: Vascular Surgery

## 2021-06-28 DIAGNOSIS — Z992 Dependence on renal dialysis: Secondary | ICD-10-CM | POA: Diagnosis not present

## 2021-06-28 DIAGNOSIS — D631 Anemia in chronic kidney disease: Secondary | ICD-10-CM | POA: Diagnosis not present

## 2021-06-28 DIAGNOSIS — N186 End stage renal disease: Secondary | ICD-10-CM | POA: Diagnosis not present

## 2021-06-28 DIAGNOSIS — D688 Other specified coagulation defects: Secondary | ICD-10-CM | POA: Diagnosis not present

## 2021-06-28 DIAGNOSIS — N2581 Secondary hyperparathyroidism of renal origin: Secondary | ICD-10-CM | POA: Diagnosis not present

## 2021-06-28 DIAGNOSIS — D509 Iron deficiency anemia, unspecified: Secondary | ICD-10-CM | POA: Diagnosis not present

## 2021-07-01 DIAGNOSIS — D631 Anemia in chronic kidney disease: Secondary | ICD-10-CM | POA: Diagnosis not present

## 2021-07-01 DIAGNOSIS — D688 Other specified coagulation defects: Secondary | ICD-10-CM | POA: Diagnosis not present

## 2021-07-01 DIAGNOSIS — N186 End stage renal disease: Secondary | ICD-10-CM | POA: Diagnosis not present

## 2021-07-01 DIAGNOSIS — N2581 Secondary hyperparathyroidism of renal origin: Secondary | ICD-10-CM | POA: Diagnosis not present

## 2021-07-01 DIAGNOSIS — D509 Iron deficiency anemia, unspecified: Secondary | ICD-10-CM | POA: Diagnosis not present

## 2021-07-01 DIAGNOSIS — Z992 Dependence on renal dialysis: Secondary | ICD-10-CM | POA: Diagnosis not present

## 2021-07-02 ENCOUNTER — Ambulatory Visit (INDEPENDENT_AMBULATORY_CARE_PROVIDER_SITE_OTHER): Payer: Medicare Other | Admitting: Vascular Surgery

## 2021-07-02 ENCOUNTER — Encounter: Payer: Self-pay | Admitting: Vascular Surgery

## 2021-07-02 ENCOUNTER — Other Ambulatory Visit: Payer: Self-pay

## 2021-07-02 VITALS — BP 154/79 | HR 62 | Temp 98.2°F | Resp 16 | Ht 70.0 in | Wt 178.0 lb

## 2021-07-02 DIAGNOSIS — N186 End stage renal disease: Secondary | ICD-10-CM

## 2021-07-02 DIAGNOSIS — Z992 Dependence on renal dialysis: Secondary | ICD-10-CM | POA: Diagnosis not present

## 2021-07-02 DIAGNOSIS — T82898A Other specified complication of vascular prosthetic devices, implants and grafts, initial encounter: Secondary | ICD-10-CM

## 2021-07-02 NOTE — Progress Notes (Signed)
Office Note     CC:  ESRD Requesting Provider:  Jones, Cleotis Sparr L, MD  HPI: [name] is a Right handed 70 y.o. (12/26/1950) male with kidney disease who presents at the request of Jones, Kelley Knoth L, MD for permanent HD access.  [name] has had multiple prior access procedures including bilateral radiocephalic fistulas which did not mature, followed by right sided brachiocephalic fistula.  This was created in 2013 and used until the patient underwent kidney transplant.  His transplant was deemed disadvantaged however lasted for nearly 7 years. Tunneled lines have been placed in right internal jugular vein. Current access is right tunneled HD line.  [name] presents to clinic today accompanied by his wife.  On exam, he was doing well with no complaints.  He understands the role of dialysis as well as fistula creation. [name] did note at the time of left radiocephalic fistula creation his arm had significant edema, and therefore the fistula was ligated.  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   The pt is  on a statin for cholesterol management.  The pt is not on a daily aspirin.   Other AC:   The pt is on medications for hypertension.   The pt is not diabetic. Tobacco hx:  -   07/02/21: Patient returns to review venograms. 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   Past Medical History:  Diagnosis Date   Anemia    Anemia    Benign colon polyp 08/11/2010   Clot    STATED TOLD IN APRIL CLOT TO LEFT SHOULDER. 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   HTN (hypertension)    Hyperlipidemia    Renal failure     Past Surgical History:  Procedure Laterality Date   AV FISTULA PLACEMENT  01/19/2012   Procedure:  ARTERIOVENOUS (AV) FISTULA CREATION;  Surgeon: Brian L Chen, MD;  Location: [facility] OR;  Service: Vascular;  Laterality: Right;  Right Brachio-cephalic arteriovenous fistula   AV FISTULA PLACEMENT, RADIOCEPHALIC  12/31/10   Right arm   INSERTION OF DIALYSIS CATHETER  01/19/2012   Procedure: INSERTION OF DIALYSIS CATHETER;  Surgeon: Brian L Chen, MD;  Location: [facility] OR;  Service: Vascular;  Laterality: N/A;  right internal jugular vein   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   UPPER EXTREMITY VENOGRAPHY Right 06/11/2021   Procedure: UPPER EXTREMITY VENOGRAPHY;  Surgeon: Brabham, Vance W, MD;  Location: [facility];  Service: Cardiovascular;  Laterality: Right;   VENOGRAM Left 05/30/2021   Procedure: DIAGNOSTIC CENTRAL VENOGRAM;  Surgeon: Robins, Joshua E, MD;  Location: [facility] OR;  Service: Vascular;  Laterality: Left;    Social History   Socioeconomic History   Marital status: Married    Spouse name: Not on file   Number of children: 5   Years of education: Not on file   Highest education level: Not on file  Occupational History   Occupation: PhD Engineer    Employer: [employer]  Tobacco Use   Smoking status: Never   Smokeless tobacco: Never  Vaping Use   Vaping Use: Never used  Substance and Sexual Activity   Alcohol use: No    Comment: 1 drink a month or non     Drug use: No   Sexual activity: Yes    Birth control/protection: Condom  Other Topics Concern   Not on file  Social History Narrative   Regular Exercise -  YES         Social Determinants of Health   Financial Resource Strain: Not on file  Food Insecurity: Not on file  Transportation Needs: Not on file  Physical Activity: Not on file  Stress: Not on file  Social Connections: Not on file  Intimate Partner Violence: Not on file    Family History  Problem Relation Age of Onset   Diabetes Father    Colon cancer Neg Hx    Esophageal cancer Neg Hx    Stomach cancer Neg Hx    Rectal cancer Neg Hx     Current  Outpatient Medications  Medication Sig Dispense Refill   ACCU-CHEK SOFTCLIX LANCETS lancets 1 each by Other route 2 (two) times daily. Use to check blood sugars twice a day 100 each 3   acetaminophen (TYLENOL) 500 MG tablet Take 1,000 mg by mouth.     amLODipine (NORVASC) 5 MG tablet Take 5 mg by mouth daily.     Blood Glucose Monitoring Suppl (ACCU-CHEK AVIVA PLUS) w/Device KIT Use as directed to check blood sugars 1 kit 0   carvedilol (COREG) 25 MG tablet Take 25 mg by mouth 2 (two) times daily with a meal.     Cinacalcet HCl (SENSIPAR PO) Take 60 mg by mouth daily.     glucose blood test strip Use TID 100 each 12   losartan (COZAAR) 25 MG tablet Take 25 mg by mouth every evening.     sevelamer carbonate (RENVELA) 800 MG tablet Take 1,600 mg by mouth 3 (three) times daily with meals.     No current facility-administered medications for this visit.    No Known Allergies    REVIEW OF SYSTEMS:   [X] denotes positive finding, [ ] denotes negative finding Cardiac  Comments:  Chest pain or chest pressure:    Shortness of breath upon exertion:    Short of breath when lying flat:    Irregular heart rhythm:        Vascular    Pain in calf, thigh, or hip brought on by ambulation:    Pain in feet at night that wakes you up from your sleep:     Blood clot in your veins:    Leg swelling:         Pulmonary    Oxygen at home:    Productive cough:     Wheezing:         Neurologic    Sudden weakness in arms or legs:     Sudden numbness in arms or legs:     Sudden onset of difficulty speaking or slurred speech:    Temporary loss of vision in one eye:     Problems with dizziness:         Gastrointestinal    Blood in stool:     Vomited blood:         Genitourinary    Burning when urinating:     Blood in urine:        Psychiatric    Major depression:         Hematologic    Bleeding problems:    Problems with blood clotting too easily:        Skin    Rashes or ulcers:           Constitutional    Fever or chills:      PHYSICAL EXAMINATION:  Vitals:   07/02/21 1506  BP: (!) 154/79  Pulse: 62  Resp: 16  Temp: 98.2 F (36.8 C)  TempSrc: Temporal  SpO2: 96%  Weight: 178 lb (80.7 kg)  Height: 5' 10" (1.778 m)    General:  WDWN in NAD; vital signs documented above Gait: Not observed HENT: WNL, normocephalic Pulmonary: normal non-labored breathing , without Rales, rhonchi,  wheezing Cardiac: regular HR,  Abdomen: soft, NT, no masses Skin: without rashes Vascular Exam/Pulses:  Right Left  Radial 2+ (normal) 2+ (normal)  Ulnar 2+ (normal) 2+ (normal)                   Extremities: without ischemic changes, without Gangrene , without cellulitis; without open wounds;  Musculoskeletal: no muscle wasting or atrophy  Neurologic: A&O X 3;  No focal weakness or paresthesias are detected Psychiatric:  The pt has Normal affect.   Non-Invasive Vascular Imaging:   Noninvasive imaging was evaluated demonstrating adequately sized cephalic vein in the left upper extremity as well as bilateral basilic veins. The artery was of sufficient size    ASSESSMENT/PLAN: [name] is a 70 y.o. right handed male in need of permanent hemodialysis access. I reviewed options for dialysis in detail with the patient. I counseled the patient that dialysis access requires surveillance and periodic maintenance. Plan to proceed with right arteriovenous graft with partial excision of right brachiocephalic thrombosed, aneurysmal fistula.    Dariella Gillihan N Libra Gatz, MD [facility] [phone]  

## 2021-07-02 NOTE — H&P (View-Only) (Signed)
Office Note     CC:  ESRD Requesting Provider:  Janith Lima, MD  HPI: [name] is a Right handed 70 y.o. ([birth year]) male with kidney disease who presents at the request of Janith Lima, MD for permanent HD access.  [name] has had multiple prior access procedures including bilateral radiocephalic fistulas which did not mature, followed by right sided brachiocephalic fistula.  This was created in 2013 and used until the patient underwent kidney transplant.  His transplant was deemed disadvantaged however lasted for nearly 7 years. Tunneled lines have been placed in right internal jugular vein. Current access is right tunneled HD line.  [name] presents to clinic today accompanied by his wife.  On exam, he was doing well with no complaints.  He understands the role of dialysis as well as fistula creation. [name] did note at the time of left radiocephalic fistula creation his arm had significant edema, and therefore the fistula was ligated.  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   The pt is  on a statin for cholesterol management.  The pt is not on a daily aspirin.   Other AC:   The pt is on medications for hypertension.   The pt is not diabetic. Tobacco hx:  -   07/02/21: Patient returns to review venograms. 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   Past Medical History:  Diagnosis Date   Anemia    Anemia    Benign colon polyp 08/11/2010   Clot    STATED TOLD IN APRIL CLOT TO LEFT SHOULDER. 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   HTN (hypertension)    Hyperlipidemia    Renal failure     Past Surgical History:  Procedure Laterality Date   AV FISTULA PLACEMENT  01/19/2012   Procedure:  ARTERIOVENOUS (AV) FISTULA CREATION;  Surgeon: Conrad Brady, MD;  Location: [place];  Service: Vascular;  Laterality: Right;  Right Brachio-cephalic arteriovenous fistula   AV FISTULA PLACEMENT, RADIOCEPHALIC  16/10/96   Right arm   INSERTION OF DIALYSIS CATHETER  01/19/2012   Procedure: INSERTION OF DIALYSIS CATHETER;  Surgeon: Conrad Rosedale, MD;  Location: [place];  Service: Vascular;  Laterality: N/A;  right internal jugular vein   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   UPPER EXTREMITY VENOGRAPHY Right 06/11/2021   Procedure: UPPER EXTREMITY VENOGRAPHY;  Surgeon: Serafina Mitchell, MD;  Location: [place] CV LAB;  Service: Cardiovascular;  Laterality: Right;   VENOGRAM Left 05/30/2021   Procedure: DIAGNOSTIC CENTRAL VENOGRAM;  Surgeon: Broadus John, MD;  Location: [facility] OR;  Service: Vascular;  Laterality: Left;    Social History   Socioeconomic History   Marital status: Married    Spouse name: Not on file   Number of children: 5   Years of education: Not on file   Highest education level: Not on file  Occupational History   Occupation: PhD Lobbyist: [employer]  Tobacco Use   Smoking status: Never   Smokeless tobacco: Never  Vaping Use   Vaping Use: Never used  Substance and Sexual Activity   Alcohol use: No    Comment: 1 drink a month or non  Drug use: No   Sexual activity: Yes    Birth control/protection: Condom  Other Topics Concern   Not on file  Social History Narrative   Regular Exercise -  YES         Social Determinants of Health   Financial Resource Strain: Not on file  Food Insecurity: Not on file  Transportation Needs: Not on file  Physical Activity: Not on file  Stress: Not on file  Social Connections: Not on file  Intimate Partner Violence: Not on file    Family History  Problem Relation Age of Onset   Diabetes Father    Colon cancer Neg Hx    Esophageal cancer Neg Hx    Stomach cancer Neg Hx    Rectal cancer Neg Hx     Current  Outpatient Medications  Medication Sig Dispense Refill   ACCU-CHEK SOFTCLIX LANCETS lancets 1 each by Other route 2 (two) times daily. Use to check blood sugars twice a day 100 each 3   acetaminophen (TYLENOL) 500 MG tablet Take 1,000 mg by mouth.     amLODipine (NORVASC) 5 MG tablet Take 5 mg by mouth daily.     Blood Glucose Monitoring Suppl (ACCU-CHEK AVIVA PLUS) w/Device KIT Use as directed to check blood sugars 1 kit 0   carvedilol (COREG) 25 MG tablet Take 25 mg by mouth 2 (two) times daily with a meal.     Cinacalcet HCl (SENSIPAR PO) Take 60 mg by mouth daily.     glucose blood test strip Use TID 100 each 12   losartan (COZAAR) 25 MG tablet Take 25 mg by mouth every evening.     sevelamer carbonate (RENVELA) 800 MG tablet Take 1,600 mg by mouth 3 (three) times daily with meals.     No current facility-administered medications for this visit.    No Known Allergies    REVIEW OF SYSTEMS:   _0  denotes positive finding, _1  denotes negative finding Cardiac  Comments:  Chest pain or chest pressure:    Shortness of breath upon exertion:    Short of breath when lying flat:    Irregular heart rhythm:        Vascular    Pain in calf, thigh, or hip brought on by ambulation:    Pain in feet at night that wakes you up from your sleep:     Blood clot in your veins:    Leg swelling:         Pulmonary    Oxygen at home:    Productive cough:     Wheezing:         Neurologic    Sudden weakness in arms or legs:     Sudden numbness in arms or legs:     Sudden onset of difficulty speaking or slurred speech:    Temporary loss of vision in one eye:     Problems with dizziness:         Gastrointestinal    Blood in stool:     Vomited blood:         Genitourinary    Burning when urinating:     Blood in urine:        Psychiatric    Major depression:         Hematologic    Bleeding problems:    Problems with blood clotting too easily:        Skin    Rashes or ulcers:  Constitutional    Fever or chills:      PHYSICAL EXAMINATION:  Vitals:   07/02/21 1506  BP: (!) 154/79  Pulse: 62  Resp: 16  Temp: 98.2 F (36.8 C)  TempSrc: Temporal  SpO2: 96%  Weight: 178 lb (80.7 kg)  Height: _0  (1.778 m)    General:  WDWN in NAD; vital signs documented above Gait: Not observed HENT: WNL, normocephalic Pulmonary: normal non-labored breathing , without Rales, rhonchi,  wheezing Cardiac: regular HR,  Abdomen: soft, NT, no masses Skin: without rashes Vascular Exam/Pulses:  Right Left  Radial 2+ (normal) 2+ (normal)  Ulnar 2+ (normal) 2+ (normal)                   Extremities: without ischemic changes, without Gangrene , without cellulitis; without open wounds;  Musculoskeletal: no muscle wasting or atrophy  Neurologic: A&O X 3;  No focal weakness or paresthesias are detected Psychiatric:  The pt has Normal affect.   Non-Invasive Vascular Imaging:   Noninvasive imaging was evaluated demonstrating adequately sized cephalic vein in the left upper extremity as well as bilateral basilic veins. The artery was of sufficient size    ASSESSMENT/PLAN: [name] is a 70 y.o. right handed male in need of permanent hemodialysis access. I reviewed options for dialysis in detail with the patient. I counseled the patient that dialysis access requires surveillance and periodic maintenance. Plan to proceed with right arteriovenous graft with partial excision of right brachiocephalic thrombosed, aneurysmal fistula.    [name], MD [facility] [phone]

## 2021-07-03 DIAGNOSIS — D688 Other specified coagulation defects: Secondary | ICD-10-CM | POA: Diagnosis not present

## 2021-07-03 DIAGNOSIS — D509 Iron deficiency anemia, unspecified: Secondary | ICD-10-CM | POA: Diagnosis not present

## 2021-07-03 DIAGNOSIS — N186 End stage renal disease: Secondary | ICD-10-CM | POA: Diagnosis not present

## 2021-07-03 DIAGNOSIS — D631 Anemia in chronic kidney disease: Secondary | ICD-10-CM | POA: Diagnosis not present

## 2021-07-03 DIAGNOSIS — N2581 Secondary hyperparathyroidism of renal origin: Secondary | ICD-10-CM | POA: Diagnosis not present

## 2021-07-03 DIAGNOSIS — Z992 Dependence on renal dialysis: Secondary | ICD-10-CM | POA: Diagnosis not present

## 2021-07-06 DIAGNOSIS — D631 Anemia in chronic kidney disease: Secondary | ICD-10-CM | POA: Diagnosis not present

## 2021-07-06 DIAGNOSIS — N186 End stage renal disease: Secondary | ICD-10-CM | POA: Diagnosis not present

## 2021-07-06 DIAGNOSIS — D688 Other specified coagulation defects: Secondary | ICD-10-CM | POA: Diagnosis not present

## 2021-07-06 DIAGNOSIS — N2581 Secondary hyperparathyroidism of renal origin: Secondary | ICD-10-CM | POA: Diagnosis not present

## 2021-07-06 DIAGNOSIS — Z992 Dependence on renal dialysis: Secondary | ICD-10-CM | POA: Diagnosis not present

## 2021-07-06 DIAGNOSIS — D509 Iron deficiency anemia, unspecified: Secondary | ICD-10-CM | POA: Diagnosis not present

## 2021-07-08 DIAGNOSIS — D688 Other specified coagulation defects: Secondary | ICD-10-CM | POA: Diagnosis not present

## 2021-07-08 DIAGNOSIS — Z992 Dependence on renal dialysis: Secondary | ICD-10-CM | POA: Diagnosis not present

## 2021-07-08 DIAGNOSIS — D631 Anemia in chronic kidney disease: Secondary | ICD-10-CM | POA: Diagnosis not present

## 2021-07-08 DIAGNOSIS — D509 Iron deficiency anemia, unspecified: Secondary | ICD-10-CM | POA: Diagnosis not present

## 2021-07-08 DIAGNOSIS — N2581 Secondary hyperparathyroidism of renal origin: Secondary | ICD-10-CM | POA: Diagnosis not present

## 2021-07-08 DIAGNOSIS — N186 End stage renal disease: Secondary | ICD-10-CM | POA: Diagnosis not present

## 2021-07-10 DIAGNOSIS — D688 Other specified coagulation defects: Secondary | ICD-10-CM | POA: Diagnosis not present

## 2021-07-10 DIAGNOSIS — D509 Iron deficiency anemia, unspecified: Secondary | ICD-10-CM | POA: Diagnosis not present

## 2021-07-10 DIAGNOSIS — T8610 Unspecified complication of kidney transplant: Secondary | ICD-10-CM | POA: Diagnosis not present

## 2021-07-10 DIAGNOSIS — N2581 Secondary hyperparathyroidism of renal origin: Secondary | ICD-10-CM | POA: Diagnosis not present

## 2021-07-10 DIAGNOSIS — Z992 Dependence on renal dialysis: Secondary | ICD-10-CM | POA: Diagnosis not present

## 2021-07-10 DIAGNOSIS — D631 Anemia in chronic kidney disease: Secondary | ICD-10-CM | POA: Diagnosis not present

## 2021-07-10 DIAGNOSIS — N186 End stage renal disease: Secondary | ICD-10-CM | POA: Diagnosis not present

## 2021-07-12 DIAGNOSIS — D509 Iron deficiency anemia, unspecified: Secondary | ICD-10-CM | POA: Diagnosis not present

## 2021-07-12 DIAGNOSIS — D688 Other specified coagulation defects: Secondary | ICD-10-CM | POA: Diagnosis not present

## 2021-07-12 DIAGNOSIS — Z992 Dependence on renal dialysis: Secondary | ICD-10-CM | POA: Diagnosis not present

## 2021-07-12 DIAGNOSIS — N2581 Secondary hyperparathyroidism of renal origin: Secondary | ICD-10-CM | POA: Diagnosis not present

## 2021-07-12 DIAGNOSIS — N186 End stage renal disease: Secondary | ICD-10-CM | POA: Diagnosis not present

## 2021-07-15 DIAGNOSIS — D688 Other specified coagulation defects: Secondary | ICD-10-CM | POA: Diagnosis not present

## 2021-07-15 DIAGNOSIS — D509 Iron deficiency anemia, unspecified: Secondary | ICD-10-CM | POA: Diagnosis not present

## 2021-07-15 DIAGNOSIS — N2581 Secondary hyperparathyroidism of renal origin: Secondary | ICD-10-CM | POA: Diagnosis not present

## 2021-07-15 DIAGNOSIS — N186 End stage renal disease: Secondary | ICD-10-CM | POA: Diagnosis not present

## 2021-07-15 DIAGNOSIS — Z992 Dependence on renal dialysis: Secondary | ICD-10-CM | POA: Diagnosis not present

## 2021-07-17 ENCOUNTER — Other Ambulatory Visit: Payer: Self-pay

## 2021-07-17 ENCOUNTER — Encounter (HOSPITAL_COMMUNITY): Payer: Self-pay | Admitting: Vascular Surgery

## 2021-07-17 DIAGNOSIS — N186 End stage renal disease: Secondary | ICD-10-CM | POA: Diagnosis not present

## 2021-07-17 DIAGNOSIS — Z992 Dependence on renal dialysis: Secondary | ICD-10-CM | POA: Diagnosis not present

## 2021-07-17 DIAGNOSIS — D688 Other specified coagulation defects: Secondary | ICD-10-CM | POA: Diagnosis not present

## 2021-07-17 DIAGNOSIS — N2581 Secondary hyperparathyroidism of renal origin: Secondary | ICD-10-CM | POA: Diagnosis not present

## 2021-07-17 DIAGNOSIS — D509 Iron deficiency anemia, unspecified: Secondary | ICD-10-CM | POA: Diagnosis not present

## 2021-07-17 NOTE — Anesthesia Preprocedure Evaluation (Addendum)
Anesthesia Evaluation  Patient identified by MRN, date of birth, ID band Patient awake    Reviewed: Allergy & Precautions, NPO status , Patient's Chart, lab work & pertinent test results, reviewed documented beta blocker date and time   Airway Mallampati: IV  TM Distance: >3 FB Neck ROM: Full    Dental  (+) Dental Advisory Given, Teeth Intact   Pulmonary neg pulmonary ROS,    Pulmonary exam normal breath sounds clear to auscultation       Cardiovascular hypertension (149/82), Pt. on medications and Pt. on home beta blockers + Peripheral Vascular Disease  Normal cardiovascular exam Rhythm:Regular Rate:Normal     Neuro/Psych  Headaches, negative psych ROS   GI/Hepatic negative GI ROS, Neg liver ROS,   Endo/Other  diabetes, Well Controlled, Type 2a1c 5.8  Renal/GU ESRFRenal diseaseLast HD yesterday  K  negative genitourinary   Musculoskeletal negative musculoskeletal ROS (+)   Abdominal   Peds  Hematology negative hematology ROS (+)   Anesthesia Other Findings has had multiple prior access procedures including bilateral radiocephalic fistulas which did not mature, followed by right sided brachiocephalic fistula.  This was created in 2013 and used until the patient underwent kidney transplant.  His transplant was deemed disadvantaged however lasted for nearly 7 years. Tunneled lines have been placed in right internal jugular vein. Current access is right tunneled HD line.  Reproductive/Obstetrics negative OB ROS                            Anesthesia Physical Anesthesia Plan  ASA: 3  Anesthesia Plan: MAC and Regional   Post-op Pain Management: Tylenol PO (pre-op)   Induction:   PONV Risk Score and Plan: 2 and Propofol infusion and TIVA  Airway Management Planned: Natural Airway and Simple Face Mask  Additional Equipment: None  Intra-op Plan:   Post-operative Plan:   Informed  Consent: I have reviewed the patients History and Physical, chart, labs and discussed the procedure including the risks, benefits and alternatives for the proposed anesthesia with the patient or authorized representative who has indicated his/her understanding and acceptance.     Dental advisory given  Plan Discussed with: CRNA  Anesthesia Plan Comments:        Anesthesia Quick Evaluation

## 2021-07-17 NOTE — Progress Notes (Signed)
PCP - Dr. Ronnald Ramp  Cardiologist - Denies  EP-Denies  Endocrine-Denies  Pulm-Denies  Chest x-ray - 09/15/20 (E)  EKG - 06/13/21 (CE)-Req'd  Stress Test - Denies  ECHO - 09/16/20 (E)  Cardiac Cath - Denies  AICD-na PM-na LOOP-na  Dialysis- M-W-F, currently in a session  Sleep Study - Denies CPAP - Denies  LABS- 07/18/21: I-Stat 8  ASA-Denies  ERAS-No  HA1C- 06/13/21 (CE): 4.7 Fasting Blood Sugar - 90-150 Checks Blood Sugar ___3__ times a day  Anesthesia- Yes- requested an EKG tracing from 06/13/21, anesthesia PAs notified  Pt denies having chest pain, sob, or fever during the pre-op phone call. All instructions explained to the pt, with a verbal understanding of the material including: as of today, stop taking all Aspirin (unless instructed by your doctor) and Other Aspirin containing products, Vitamins, Fish oils, and Herbal medications. Also stop all NSAIDS i.e. Advil, Ibuprofen, Motrin, Aleve, Anaprox, Naproxen, BC, Goody Powders, and all Supplements.   How do I manage my blood sugar before surgery? Check your blood sugar the morning of your surgery when you wake up and every 2 hours until you get to the Short Stay unit. If your blood sugar is less than 70 mg/dL, you will need to treat for low blood sugar: Do not take insulin. Treat a low blood sugar (less than 70 mg/dL) with  cup of clear juice (cranberry or apple), 4 glucose tablets, OR glucose gel. Recheck blood sugar in 15 minutes after treatment (to make sure it is greater than 70 mg/dL). If your blood sugar is not greater than 70 mg/dL on recheck, call [phone]  for further instructions. Report your blood sugar to the short stay nurse when you get to Short Stay.  If your CBG is greater than 220 mg/dL, inform the staff upon arrival to Short Stay.   Reviewed and Endorsed by [facility] Patient Education Committee, August 2015   Pt also instructed to wear a mask and social distance if he goes out. The  opportunity to ask questions was provided.    Coronavirus Screening  Have you experienced the following symptoms:  Cough yes/no: No Fever (>100.91F)  yes/no: No Runny nose yes/no: No Sore throat yes/no: No Difficulty breathing/shortness of breath  yes/no: No  Have you or a family member traveled in the last 14 days and where? yes/no: No   If the patient indicates "YES" to the above questions, their PAT will be rescheduled to limit the exposure to others and, the surgeon will be notified. THE PATIENT WILL NEED TO BE ASYMPTOMATIC FOR 14 DAYS.   If the patient is not experiencing any of these symptoms, the PAT nurse will instruct them to NOT bring anyone with them to their appointment since they may have these symptoms or traveled as well.   Please remind your patients and families that hospital visitation restrictions are in effect and the importance of the restrictions.

## 2021-07-18 ENCOUNTER — Other Ambulatory Visit: Payer: Self-pay

## 2021-07-18 ENCOUNTER — Ambulatory Visit (HOSPITAL_COMMUNITY)
Admission: RE | Admit: 2021-07-18 | Discharge: 2021-07-18 | Disposition: A | Discharge status: Home / Self Care | Payer: Medicare Other | Attending: Vascular Surgery | Admitting: Vascular Surgery

## 2021-07-18 ENCOUNTER — Other Ambulatory Visit: Payer: Self-pay | Admitting: Physician Assistant

## 2021-07-18 ENCOUNTER — Ambulatory Visit (HOSPITAL_COMMUNITY): Payer: Medicare Other | Admitting: Physician Assistant

## 2021-07-18 ENCOUNTER — Encounter (HOSPITAL_COMMUNITY): Payer: Self-pay | Admitting: Vascular Surgery

## 2021-07-18 ENCOUNTER — Encounter (HOSPITAL_COMMUNITY)
Admission: RE | Disposition: A | Discharge status: Home / Self Care | Payer: Self-pay | Source: Home / Self Care | Attending: Vascular Surgery

## 2021-07-18 DIAGNOSIS — E1151 Type 2 diabetes mellitus with diabetic peripheral angiopathy without gangrene: Secondary | ICD-10-CM | POA: Diagnosis not present

## 2021-07-18 DIAGNOSIS — Z992 Dependence on renal dialysis: Secondary | ICD-10-CM | POA: Diagnosis not present

## 2021-07-18 DIAGNOSIS — I120 Hypertensive chronic kidney disease with stage 5 chronic kidney disease or end stage renal disease: Secondary | ICD-10-CM | POA: Diagnosis not present

## 2021-07-18 DIAGNOSIS — Z940 Kidney transplant status: Secondary | ICD-10-CM | POA: Insufficient documentation

## 2021-07-18 DIAGNOSIS — N186 End stage renal disease: Secondary | ICD-10-CM | POA: Insufficient documentation

## 2021-07-18 DIAGNOSIS — E1122 Type 2 diabetes mellitus with diabetic chronic kidney disease: Secondary | ICD-10-CM | POA: Diagnosis not present

## 2021-07-18 DIAGNOSIS — R519 Headache, unspecified: Secondary | ICD-10-CM | POA: Diagnosis not present

## 2021-07-18 DIAGNOSIS — Z452 Encounter for adjustment and management of vascular access device: Secondary | ICD-10-CM | POA: Insufficient documentation

## 2021-07-18 DIAGNOSIS — D631 Anemia in chronic kidney disease: Secondary | ICD-10-CM | POA: Diagnosis not present

## 2021-07-18 HISTORY — PX: AV FISTULA PLACEMENT: SHX1204

## 2021-07-18 LAB — GLUCOSE, CAPILLARY
Glucose-Capillary: 96 mg/dL (ref 70–99)
Glucose-Capillary: 105 mg/dL — ABNORMAL HIGH (ref 70–99)

## 2021-07-18 LAB — POCT I-STAT, CHEM 8
BUN: 31 mg/dL — ABNORMAL HIGH (ref 8–23)
Calcium, Ion: 1.02 mmol/L — ABNORMAL LOW (ref 1.15–1.40)
Chloride: 95 mmol/L — ABNORMAL LOW (ref 98–111)
Creatinine, Ser: 9.6 mg/dL — ABNORMAL HIGH (ref 0.61–1.24)
Glucose, Bld: 87 mg/dL (ref 70–99)
HCT: 40 % (ref 39.0–52.0)
Hemoglobin: 13.6 g/dL (ref 13.0–17.0)
Potassium: 4.6 mmol/L (ref 3.5–5.1)
Sodium: 137 mmol/L (ref 135–145)
TCO2: 32 mmol/L (ref 22–32)

## 2021-07-18 SURGERY — INSERTION OF ARTERIOVENOUS (AV) GORE-TEX GRAFT ARM
Anesthesia: Monitor Anesthesia Care | Site: Arm Upper | Laterality: Right

## 2021-07-18 MED ORDER — PHENYLEPHRINE 40 MCG/ML (10ML) SYRINGE FOR IV PUSH (FOR BLOOD PRESSURE SUPPORT)
PREFILLED_SYRINGE | INTRAVENOUS | Status: DC | PRN
  Administered 2021-07-18: 80 ug via INTRAVENOUS

## 2021-07-18 MED ORDER — MIDAZOLAM HCL 5 MG/5ML IJ SOLN
INTRAMUSCULAR | Status: DC | PRN
  Administered 2021-07-18 (×2): 1 mg via INTRAVENOUS

## 2021-07-18 MED ORDER — HYDROMORPHONE HCL 1 MG/ML IJ SOLN: 0.2500 mg | INTRAMUSCULAR | Status: DC | PRN

## 2021-07-18 MED ORDER — PROPOFOL 500 MG/50ML IV EMUL
INTRAVENOUS | Status: DC | PRN
  Administered 2021-07-18: 75 ug/kg/min via INTRAVENOUS

## 2021-07-18 MED ORDER — OXYCODONE HCL 5 MG PO TABS: 5.0000 mg | ORAL_TABLET | Freq: Four times a day (QID) | ORAL | 0 refills | Status: DC | PRN

## 2021-07-18 MED ORDER — ORAL CARE MOUTH RINSE: 15.0000 mL | Freq: Once | OROMUCOSAL | Status: AC

## 2021-07-18 MED ORDER — CEFAZOLIN SODIUM-DEXTROSE 2-4 GM/100ML-% IV SOLN
2.0000 g | INTRAVENOUS | Status: AC
  Administered 2021-07-18: 2 g via INTRAVENOUS
  Filled 2021-07-18: qty 100

## 2021-07-18 MED ORDER — HEPARIN 6000 UNIT IRRIGATION SOLUTION
Status: AC
  Filled 2021-07-18: qty 500

## 2021-07-18 MED ORDER — PROPOFOL 10 MG/ML IV BOLUS
INTRAVENOUS | Status: DC | PRN
  Administered 2021-07-18: 20 mg via INTRAVENOUS

## 2021-07-18 MED ORDER — CHLORHEXIDINE GLUCONATE 4 % EX LIQD: 60.0000 mL | Freq: Once | CUTANEOUS | Status: DC

## 2021-07-18 MED ORDER — HEPARIN 6000 UNIT IRRIGATION SOLUTION
Status: DC | PRN
  Administered 2021-07-18: 1

## 2021-07-18 MED ORDER — ONDANSETRON HCL 4 MG/2ML IJ SOLN
INTRAMUSCULAR | Status: DC | PRN
  Administered 2021-07-18: 4 mg via INTRAVENOUS

## 2021-07-18 MED ORDER — OXYCODONE HCL 5 MG/5ML PO SOLN: 5.0000 mg | Freq: Once | ORAL | Status: DC | PRN

## 2021-07-18 MED ORDER — FENTANYL CITRATE (PF) 250 MCG/5ML IJ SOLN
INTRAMUSCULAR | Status: AC
  Filled 2021-07-18: qty 5

## 2021-07-18 MED ORDER — 0.9 % SODIUM CHLORIDE (POUR BTL) OPTIME
TOPICAL | Status: DC | PRN
  Administered 2021-07-18: 1000 mL

## 2021-07-18 MED ORDER — LIDOCAINE-EPINEPHRINE (PF) 1.5 %-1:200000 IJ SOLN
INTRAMUSCULAR | Status: DC | PRN
  Administered 2021-07-18: 25 mL via PERINEURAL

## 2021-07-18 MED ORDER — CARVEDILOL 12.5 MG PO TABS
ORAL_TABLET | ORAL | Status: AC
  Filled 2021-07-18: qty 2

## 2021-07-18 MED ORDER — PHENYLEPHRINE HCL-NACL 20-0.9 MG/250ML-% IV SOLN
INTRAVENOUS | Status: DC | PRN
  Administered 2021-07-18: 40 ug/min via INTRAVENOUS

## 2021-07-18 MED ORDER — CARVEDILOL 12.5 MG PO TABS
25.0000 mg | ORAL_TABLET | Freq: Once | ORAL | Status: AC
  Administered 2021-07-18: 25 mg via ORAL

## 2021-07-18 MED ORDER — ONDANSETRON HCL 4 MG/2ML IJ SOLN: 4.0000 mg | Freq: Once | INTRAMUSCULAR | Status: DC | PRN

## 2021-07-18 MED ORDER — ONDANSETRON HCL 4 MG/2ML IJ SOLN
INTRAMUSCULAR | Status: AC
  Filled 2021-07-18: qty 2

## 2021-07-18 MED ORDER — SODIUM CHLORIDE 0.9 % IV SOLN: INTRAVENOUS | Status: DC

## 2021-07-18 MED ORDER — HEPARIN SODIUM (PORCINE) 1000 UNIT/ML IJ SOLN
INTRAMUSCULAR | Status: DC | PRN
  Administered 2021-07-18: 5000 [IU] via INTRAVENOUS

## 2021-07-18 MED ORDER — OXYCODONE HCL 5 MG PO TABS: 5.0000 mg | ORAL_TABLET | Freq: Once | ORAL | Status: DC | PRN

## 2021-07-18 MED ORDER — CARVEDILOL 12.5 MG PO TABS: 25.0000 mg | ORAL_TABLET | Freq: Two times a day (BID) | ORAL | Status: DC

## 2021-07-18 MED ORDER — PROPOFOL 10 MG/ML IV BOLUS
INTRAVENOUS | Status: AC
  Filled 2021-07-18: qty 20

## 2021-07-18 MED ORDER — LIDOCAINE-EPINEPHRINE (PF) 1 %-1:200000 IJ SOLN
INTRAMUSCULAR | Status: AC
  Filled 2021-07-18: qty 30

## 2021-07-18 MED ORDER — PROTAMINE SULFATE 10 MG/ML IV SOLN
INTRAVENOUS | Status: DC | PRN
  Administered 2021-07-18: 50 mg via INTRAVENOUS

## 2021-07-18 MED ORDER — CHLORHEXIDINE GLUCONATE 0.12 % MT SOLN
15.0000 mL | Freq: Once | OROMUCOSAL | Status: AC
  Administered 2021-07-18: 15 mL via OROMUCOSAL
  Filled 2021-07-18: qty 15

## 2021-07-18 MED ORDER — MIDAZOLAM HCL 2 MG/2ML IJ SOLN
INTRAMUSCULAR | Status: AC
  Filled 2021-07-18: qty 2

## 2021-07-18 MED ORDER — OXYCODONE-ACETAMINOPHEN 5-325 MG PO TABS: 1.0000 | ORAL_TABLET | Freq: Four times a day (QID) | ORAL | 0 refills | Status: DC | PRN

## 2021-07-18 MED ORDER — FENTANYL CITRATE (PF) 250 MCG/5ML IJ SOLN
INTRAMUSCULAR | Status: DC | PRN
  Administered 2021-07-18: 50 ug via INTRAVENOUS

## 2021-07-18 MED ORDER — PHENYLEPHRINE 40 MCG/ML (10ML) SYRINGE FOR IV PUSH (FOR BLOOD PRESSURE SUPPORT)
PREFILLED_SYRINGE | INTRAVENOUS | Status: AC
  Filled 2021-07-18: qty 10

## 2021-07-18 MED ORDER — ACETAMINOPHEN 500 MG PO TABS
1000.0000 mg | ORAL_TABLET | Freq: Once | ORAL | Status: AC
  Administered 2021-07-18: 1000 mg via ORAL
  Filled 2021-07-18: qty 2

## 2021-07-18 SURGICAL SUPPLY — 47 items
ADH SKN CLS APL DERMABOND .7 (GAUZE/BANDAGES/DRESSINGS) ×1
APL PRP STRL LF DISP 70% ISPRP (MISCELLANEOUS) ×1
APL SKNCLS STERI-STRIP NONHPOA (GAUZE/BANDAGES/DRESSINGS) ×1
ARMBAND PINK RESTRICT EXTREMIT (MISCELLANEOUS) ×2 IMPLANT
BENZOIN TINCTURE PRP APPL 2/3 (GAUZE/BANDAGES/DRESSINGS) ×2 IMPLANT
CANISTER SUCT 3000ML PPV (MISCELLANEOUS) ×2 IMPLANT
CANNULA VESSEL 3MM 2 BLNT TIP (CANNULA) ×2 IMPLANT
CHLORAPREP W/TINT 26 (MISCELLANEOUS) ×2 IMPLANT
COVER PROBE W GEL 5X96 (DRAPES) ×1 IMPLANT
DERMABOND ADVANCED (GAUZE/BANDAGES/DRESSINGS) ×1
DERMABOND ADVANCED .7 DNX12 (GAUZE/BANDAGES/DRESSINGS) IMPLANT
ELECT REM PT RETURN 9FT ADLT (ELECTROSURGICAL) ×2
ELECTRODE REM PT RTRN 9FT ADLT (ELECTROSURGICAL) ×1 IMPLANT
GAUZE 4X4 16PLY ~~LOC~~+RFID DBL (SPONGE) ×1 IMPLANT
GLOVE SURG ENC MOIS LTX SZ8 (GLOVE) ×2 IMPLANT
GLOVE SURG UNDER POLY LF SZ6.5 (GLOVE) ×1 IMPLANT
GLOVE SURG UNDER POLY LF SZ7.5 (GLOVE) ×2 IMPLANT
GOWN STRL REUS W/ TWL LRG LVL3 (GOWN DISPOSABLE) ×2 IMPLANT
GOWN STRL REUS W/ TWL XL LVL3 (GOWN DISPOSABLE) ×1 IMPLANT
GOWN STRL REUS W/TWL LRG LVL3 (GOWN DISPOSABLE) ×4
GOWN STRL REUS W/TWL XL LVL3 (GOWN DISPOSABLE) ×2
GRAFT GORETEX STRETCH 6X40 (Vascular Products) ×1 IMPLANT
HEMOSTAT SNOW SURGICEL 2X4 (HEMOSTASIS) IMPLANT
INSERT FOGARTY SM (MISCELLANEOUS) ×2 IMPLANT
KIT BASIN OR (CUSTOM PROCEDURE TRAY) ×2 IMPLANT
KIT TURNOVER KIT B (KITS) ×2 IMPLANT
LOOP VESSEL MAXI BLUE (MISCELLANEOUS) ×1 IMPLANT
NDL 18GX1X1/2 (RX/OR ONLY) (NEEDLE) IMPLANT
NEEDLE 18GX1X1/2 (RX/OR ONLY) (NEEDLE) IMPLANT
NS IRRIG 1000ML POUR BTL (IV SOLUTION) ×2 IMPLANT
PACK CV ACCESS (CUSTOM PROCEDURE TRAY) ×2 IMPLANT
PAD ARMBOARD 7.5X6 YLW CONV (MISCELLANEOUS) ×4 IMPLANT
PENCIL SMOKE EVACUATOR (MISCELLANEOUS) ×1 IMPLANT
SLING ARM FOAM STRAP LRG (SOFTGOODS) IMPLANT
SPONGE T-LAP 18X18 ~~LOC~~+RFID (SPONGE) ×1 IMPLANT
STRIP CLOSURE SKIN 1/2X4 (GAUZE/BANDAGES/DRESSINGS) ×2 IMPLANT
SUT GORETEX 6.0 TT9 (SUTURE) ×2 IMPLANT
SUT MNCRL AB 4-0 PS2 18 (SUTURE) IMPLANT
SUT PROLENE 6 0 BV (SUTURE) ×4 IMPLANT
SUT SILK 2 0 SH (SUTURE) IMPLANT
SUT VIC AB 3-0 SH 27 (SUTURE) ×4
SUT VIC AB 3-0 SH 27X BRD (SUTURE) ×2 IMPLANT
SYR 3ML LL SCALE MARK (SYRINGE) IMPLANT
SYR TOOMEY 50ML (SYRINGE) IMPLANT
TOWEL GREEN STERILE (TOWEL DISPOSABLE) ×2 IMPLANT
UNDERPAD 30X36 HEAVY ABSORB (UNDERPADS AND DIAPERS) ×2 IMPLANT
WATER STERILE IRR 1000ML POUR (IV SOLUTION) ×2 IMPLANT

## 2021-07-18 NOTE — Transfer of Care (Signed)
Immediate Anesthesia Transfer of Care Note  Patient: [name]  Procedure(s) Performed: INSERTION OF RIGHT UPPER EXTREMITY ARTERIOVENOUS (AV) GORE-TEX GRAFT (Right: Arm Upper)  Patient Location: PACU  Anesthesia Type:MAC combined with regional for post-op pain  Level of Consciousness: awake and drowsy  Airway & Oxygen Therapy: Patient Spontanous Breathing and Patient connected to face mask oxygen  Post-op Assessment: Report given to RN and Post -op Vital signs reviewed and stable  Post vital signs: Reviewed and stable  Last Vitals:  Vitals Value Taken Time  BP    Temp    Pulse 60 07/18/21 0901  Resp 12 07/18/21 0901  SpO2 100 % 07/18/21 0901  Vitals shown include unvalidated device data.  Last Pain:  Vitals:   07/18/21 0629  TempSrc: Oral  PainSc:       Patients Stated Pain Goal: 2 (09/10/41 8887)  Complications: No notable events documented.

## 2021-07-18 NOTE — Anesthesia Postprocedure Evaluation (Signed)
Anesthesia Post Note  Patient: [name]  Procedure(s) Performed: INSERTION OF RIGHT UPPER EXTREMITY ARTERIOVENOUS (AV) GORE-TEX GRAFT (Right: Arm Upper)     Patient location during evaluation: PACU Anesthesia Type: Regional and MAC Level of consciousness: awake and alert Pain management: pain level controlled Vital Signs Assessment: post-procedure vital signs reviewed and stable Respiratory status: spontaneous breathing, nonlabored ventilation and respiratory function stable Cardiovascular status: blood pressure returned to baseline and stable Postop Assessment: no apparent nausea or vomiting Anesthetic complications: no   No notable events documented.  Last Vitals:  Vitals:   07/18/21 0917 07/18/21 0931  BP: 111/67 116/78  Pulse: 60 (!) 56  Resp: 19 12  Temp:  36.8 C  SpO2: 99% 100%    Last Pain:  Vitals:   07/18/21 0931  TempSrc:   PainSc: 0-No pain                 Pervis Hocking

## 2021-07-18 NOTE — Discharge Instructions (Signed)
   [facility]  Discharge Instructions  AV Fistula or Graft Surgery for Dialysis Access  Please refer to the following instructions for your post-procedure care. Your surgeon or physician assistant will discuss any changes with you.  Activity  You may drive the day following your surgery, if you are comfortable and no longer taking prescription pain medication. Resume full activity as the soreness in your incision resolves.  Bathing/Showering  You may shower after you go home. Keep your incision dry for 48 hours. Do not soak in a bathtub, hot tub, or swim until the incision heals completely. You may not shower if you have a hemodialysis catheter.  Incision Care  Clean your incision with mild soap and water after 48 hours. Pat the area dry with a clean towel. You do not need a bandage unless otherwise instructed. Do not apply any ointments or creams to your incision. You may have skin glue on your incision. Do not peel it off. It will come off on its own in about one week. Your arm may swell a bit after surgery. To reduce swelling use pillows to elevate your arm so it is above your heart. Your doctor will tell you if you need to lightly wrap your arm with an ACE bandage.  Diet  Resume your normal diet. There are not special food restrictions following this procedure. In order to heal from your surgery, it is CRITICAL to get adequate nutrition. Your body requires vitamins, minerals, and protein. Vegetables are the best source of vitamins and minerals. Vegetables also provide the perfect balance of protein. Processed food has little nutritional value, so try to avoid this.  Medications  Resume taking all of your medications. If your incision is causing pain, you may take over-the counter pain relievers such as acetaminophen (Tylenol). If you were prescribed a stronger pain medication, please be aware these medications can cause nausea and constipation. Prevent  nausea by taking the medication with a snack or meal. Avoid constipation by drinking plenty of fluids and eating foods with high amount of fiber, such as fruits, vegetables, and grains.  Do not take Tylenol if you are taking prescription pain medications.  Follow up Your surgeon may want to see you in the office following your access surgery. If so, this will be arranged at the time of your surgery.  Please call us immediately for any of the following conditions:  Increased pain, redness, drainage (pus) from your incision site Fever of 101 degrees or higher Severe or worsening pain at your incision site Hand pain or numbness.  Reduce your risk of vascular disease:  Stop smoking. If you would like help, call [facility] at 1-800-QUIT-NOW 267-387-9724) or [place] at [place] your cholesterol Maintain a desired weight Control your diabetes Keep your blood pressure down  Dialysis  It will take several weeks to several months for your new dialysis access to be ready for use. Your surgeon will determine when it is okay to use it. Your nephrologist will continue to direct your dialysis. You can continue to use your Permcath until your new access is ready for use.   07/18/2021 [name] [id number] 10-27-1950  Surgeon(s): [name], MD  Procedure(s): INSERTION OF RIGHT UPPER EXTREMITY ARTERIOVENOUS (AV) GORE-TEX GRAFT  x Do not stick graft for 4 weeks    If you have any questions, please call the office at [phone].

## 2021-07-18 NOTE — Anesthesia Procedure Notes (Signed)
Anesthesia Regional Block: Supraclavicular block   Pre-Anesthetic Checklist: , timeout performed,  Correct Patient, Correct Site, Correct Laterality,  Correct Procedure, Correct Position, site marked,  Risks and benefits discussed,  Surgical consent,  Pre-op evaluation,  At surgeon's request and post-op pain management  Laterality: Right  Prep: Maximum Sterile Barrier Precautions used, chloraprep       Needles:  Injection technique: Single-shot  Needle Type: Echogenic Stimulator Needle     Needle Length: 9cm  Needle Gauge: 22     Additional Needles:   Procedures:,,,, ultrasound used (permanent image in chart),,    Narrative:  Start time: 07/18/2021 7:05 AM End time: 07/18/2021 7:10 AM Injection made incrementally with aspirations every 5 mL.  Performed by: Personally  Anesthesiologist: Pervis Hocking, DO  Additional Notes: Monitors applied. No increased pain on injection. No increased resistance to injection. Injection made in 5cc increments. Good needle visualization. Patient tolerated procedure well.   R supraclavicular and intercostobrachial nerve block

## 2021-07-18 NOTE — Interval H&P Note (Signed)
History and Physical Interval Note:  07/18/2021 7:12 AM  [name]  has presented today for surgery, with the diagnosis of ESRD.  The various methods of treatment have been discussed with the patient and family. After consideration of risks, benefits and other options for treatment, the patient has consented to  Procedure(s) with comments: INSERTION OF RIGHT UPPER EXTREMITY ARTERIOVENOUS (AV) GORE-TEX GRAFT (Right) - PERIPHERAL NERVE BLOCK vs. Arteriovenous fistula as a surgical intervention.  The patient's history has been reviewed, patient examined, no change in status, stable for surgery.  I have reviewed the patient's chart and labs.  Questions were answered to the patient's satisfaction.     [name]

## 2021-07-18 NOTE — Op Note (Signed)
DATE OF SERVICE: 07/18/2021  PATIENT:  [name]  70 y.o. male  PRE-OPERATIVE DIAGNOSIS:  ESRD  POST-OPERATIVE DIAGNOSIS:  Same  PROCEDURE:   Right upper extremity brachial - brachial arteriovenous graft   SURGEON:  Surgeon(s) and Role:    * [name], MD - Primary  ASSISTANT: Kateri Plummer, PA-C  An assistant was required to facilitate exposure and expedite the case.  ANESTHESIA:   regional and MAC  EBL: 70m  BLOOD ADMINISTERED:none  DRAINS: none   LOCAL MEDICATIONS USED:  NONE  SPECIMEN:  none  COUNTS: confirmed correct.  TOURNIQUET:  none  PATIENT DISPOSITION:  PACU - hemodynamically stable.   Delay start of Pharmacological VTE agent (>24hrs) due to surgical blood loss or risk of bleeding: no  INDICATION FOR PROCEDURE: Greg BELLARDis a 70y.o. male with ESRD in need of permanent dialysis access. After careful discussion of risks, benefits, and alternatives the patient was offered right arteriovenous graft. The patient understood and wished to proceed.  OPERATIVE FINDINGS: unremarkable right arteriovenous graft.   DESCRIPTION OF PROCEDURE: After identification of the patient in the pre-operative holding area, the patient was transferred to the operating room. The patient was positioned supine on the operating room table. Anesthesia was induced. The right arm was prepped and draped in standard fashion. A surgical pause was performed confirming correct patient, procedure, and operative location.  The right brachial artery was exposed using a longitudinal incision in the distal arm just above the antecubital fossa.  Incision was carried down through subcutaneous tissue until the brachial sheath was encountered.  This was incised sharply.  The brachial artery was exposed and encircled with Silastic Vesseloops proximally and distally to the site of planned inflow.   The right brachial vein at the axilla was exposed using longitudinal incision just below the  hairbearing area of the axilla.  Incision was carried down until the brachial sheath was encountered.  The brachial vein was identified, exposed, encircled with Silastic Vesseloops.   Using a curved, sheathed tunneling device, a 4-7 mm tapered Gore-Tex graft was tunneled subcutaneously and gentle arc across the biceps of the right arm.  Patient was then heparinized with 5000 units of IV heparin.   The brachial artery was clamped proximally and distally.  An anterior arteriotomy was made with an 11 blade.  This was extended with Potts scissors.  The 4 mm end of the Gore-Tex graft was spatulated and then anastomosed end-to-side to the brachial arteriotomy using continuous running suture of 6-0 Prolene.  The anastomosis was completed and hemostasis ensured.  The graft was clamped to restore perfusion to the hand.   The brachial vein was clamped proximally and distally.  An anterior venotomy was made with an 11 blade.  This was extended with Potts scissors.  The 7 mm end of the Gore-Tex graft was then anastomosed end to side to the brachial vein venotomy using continuous running suture of 6-0 Prolene.  Immediately prior to completion the anastomosis was de-aired and flushed.  Anastomosis was then completed.  Hemostasis was insured.   Doppler machine was brought onto the field to interrogate the graft.  Doppler flow was noted in the radial artery.  About the arterial anastomosis flow was noted proximal and distal to the arterial anastomosis.  Distal to the venous anastomosis a Doppler bruit was heard.  Satisfied we ended the case here.   Surgical beds were irrigated copiously.  Hemostasis was again ensured in the surgical beds.  The  wounds were closed in layers using 3-0 Vicryl and 4-0 Monocryl.  Dermabond was applied.  Upon completion of the case instrument and sharps counts were confirmed correct. The patient was transferred to the PACU in good condition. I was present for all portions of the  procedure.  [name]. Stanford Breed, MD [facility] Phone Number: [phone] 07/18/2021 8:57 AM

## 2021-07-18 NOTE — Anesthesia Procedure Notes (Signed)
Procedure Name: MAC Date/Time: 07/18/2021 7:30 AM Performed by: Inda Coke, CRNA Pre-anesthesia Checklist: Patient identified, Emergency Drugs available, Suction available, Timeout performed and Patient being monitored Patient Re-evaluated:Patient Re-evaluated prior to induction Oxygen Delivery Method: Simple face mask Induction Type: IV induction Dental Injury: Teeth and Oropharynx as per pre-operative assessment

## 2021-07-19 ENCOUNTER — Encounter (HOSPITAL_COMMUNITY): Payer: Self-pay | Admitting: Vascular Surgery

## 2021-07-19 DIAGNOSIS — N2581 Secondary hyperparathyroidism of renal origin: Secondary | ICD-10-CM | POA: Diagnosis not present

## 2021-07-19 DIAGNOSIS — N186 End stage renal disease: Secondary | ICD-10-CM | POA: Diagnosis not present

## 2021-07-19 DIAGNOSIS — D509 Iron deficiency anemia, unspecified: Secondary | ICD-10-CM | POA: Diagnosis not present

## 2021-07-19 DIAGNOSIS — Z992 Dependence on renal dialysis: Secondary | ICD-10-CM | POA: Diagnosis not present

## 2021-07-19 DIAGNOSIS — D688 Other specified coagulation defects: Secondary | ICD-10-CM | POA: Diagnosis not present

## 2021-07-22 DIAGNOSIS — D688 Other specified coagulation defects: Secondary | ICD-10-CM | POA: Diagnosis not present

## 2021-07-22 DIAGNOSIS — N186 End stage renal disease: Secondary | ICD-10-CM | POA: Diagnosis not present

## 2021-07-22 DIAGNOSIS — N2581 Secondary hyperparathyroidism of renal origin: Secondary | ICD-10-CM | POA: Diagnosis not present

## 2021-07-22 DIAGNOSIS — D509 Iron deficiency anemia, unspecified: Secondary | ICD-10-CM | POA: Diagnosis not present

## 2021-07-22 DIAGNOSIS — Z992 Dependence on renal dialysis: Secondary | ICD-10-CM | POA: Diagnosis not present

## 2021-07-24 DIAGNOSIS — N186 End stage renal disease: Secondary | ICD-10-CM | POA: Diagnosis not present

## 2021-07-24 DIAGNOSIS — Z992 Dependence on renal dialysis: Secondary | ICD-10-CM | POA: Diagnosis not present

## 2021-07-24 DIAGNOSIS — D509 Iron deficiency anemia, unspecified: Secondary | ICD-10-CM | POA: Diagnosis not present

## 2021-07-24 DIAGNOSIS — N2581 Secondary hyperparathyroidism of renal origin: Secondary | ICD-10-CM | POA: Diagnosis not present

## 2021-07-24 DIAGNOSIS — D688 Other specified coagulation defects: Secondary | ICD-10-CM | POA: Diagnosis not present

## 2021-07-26 DIAGNOSIS — D509 Iron deficiency anemia, unspecified: Secondary | ICD-10-CM | POA: Diagnosis not present

## 2021-07-26 DIAGNOSIS — N2581 Secondary hyperparathyroidism of renal origin: Secondary | ICD-10-CM | POA: Diagnosis not present

## 2021-07-26 DIAGNOSIS — N186 End stage renal disease: Secondary | ICD-10-CM | POA: Diagnosis not present

## 2021-07-26 DIAGNOSIS — D688 Other specified coagulation defects: Secondary | ICD-10-CM | POA: Diagnosis not present

## 2021-07-26 DIAGNOSIS — Z992 Dependence on renal dialysis: Secondary | ICD-10-CM | POA: Diagnosis not present

## 2021-07-29 DIAGNOSIS — D509 Iron deficiency anemia, unspecified: Secondary | ICD-10-CM | POA: Diagnosis not present

## 2021-07-29 DIAGNOSIS — Z992 Dependence on renal dialysis: Secondary | ICD-10-CM | POA: Diagnosis not present

## 2021-07-29 DIAGNOSIS — D688 Other specified coagulation defects: Secondary | ICD-10-CM | POA: Diagnosis not present

## 2021-07-29 DIAGNOSIS — N2581 Secondary hyperparathyroidism of renal origin: Secondary | ICD-10-CM | POA: Diagnosis not present

## 2021-07-29 DIAGNOSIS — N186 End stage renal disease: Secondary | ICD-10-CM | POA: Diagnosis not present

## 2021-07-31 DIAGNOSIS — N186 End stage renal disease: Secondary | ICD-10-CM | POA: Diagnosis not present

## 2021-07-31 DIAGNOSIS — N2581 Secondary hyperparathyroidism of renal origin: Secondary | ICD-10-CM | POA: Diagnosis not present

## 2021-07-31 DIAGNOSIS — D688 Other specified coagulation defects: Secondary | ICD-10-CM | POA: Diagnosis not present

## 2021-07-31 DIAGNOSIS — Z992 Dependence on renal dialysis: Secondary | ICD-10-CM | POA: Diagnosis not present

## 2021-07-31 DIAGNOSIS — D509 Iron deficiency anemia, unspecified: Secondary | ICD-10-CM | POA: Diagnosis not present

## 2021-08-02 DIAGNOSIS — D509 Iron deficiency anemia, unspecified: Secondary | ICD-10-CM | POA: Diagnosis not present

## 2021-08-02 DIAGNOSIS — N2581 Secondary hyperparathyroidism of renal origin: Secondary | ICD-10-CM | POA: Diagnosis not present

## 2021-08-02 DIAGNOSIS — N186 End stage renal disease: Secondary | ICD-10-CM | POA: Diagnosis not present

## 2021-08-02 DIAGNOSIS — Z992 Dependence on renal dialysis: Secondary | ICD-10-CM | POA: Diagnosis not present

## 2021-08-02 DIAGNOSIS — D688 Other specified coagulation defects: Secondary | ICD-10-CM | POA: Diagnosis not present

## 2021-08-05 DIAGNOSIS — N2581 Secondary hyperparathyroidism of renal origin: Secondary | ICD-10-CM | POA: Diagnosis not present

## 2021-08-05 DIAGNOSIS — D509 Iron deficiency anemia, unspecified: Secondary | ICD-10-CM | POA: Diagnosis not present

## 2021-08-05 DIAGNOSIS — Z992 Dependence on renal dialysis: Secondary | ICD-10-CM | POA: Diagnosis not present

## 2021-08-05 DIAGNOSIS — D688 Other specified coagulation defects: Secondary | ICD-10-CM | POA: Diagnosis not present

## 2021-08-05 DIAGNOSIS — N186 End stage renal disease: Secondary | ICD-10-CM | POA: Diagnosis not present

## 2021-08-07 DIAGNOSIS — N186 End stage renal disease: Secondary | ICD-10-CM | POA: Diagnosis not present

## 2021-08-07 DIAGNOSIS — D509 Iron deficiency anemia, unspecified: Secondary | ICD-10-CM | POA: Diagnosis not present

## 2021-08-07 DIAGNOSIS — Z992 Dependence on renal dialysis: Secondary | ICD-10-CM | POA: Diagnosis not present

## 2021-08-07 DIAGNOSIS — D688 Other specified coagulation defects: Secondary | ICD-10-CM | POA: Diagnosis not present

## 2021-08-07 DIAGNOSIS — N2581 Secondary hyperparathyroidism of renal origin: Secondary | ICD-10-CM | POA: Diagnosis not present

## 2021-08-09 DIAGNOSIS — D509 Iron deficiency anemia, unspecified: Secondary | ICD-10-CM | POA: Diagnosis not present

## 2021-08-09 DIAGNOSIS — N2581 Secondary hyperparathyroidism of renal origin: Secondary | ICD-10-CM | POA: Diagnosis not present

## 2021-08-09 DIAGNOSIS — D688 Other specified coagulation defects: Secondary | ICD-10-CM | POA: Diagnosis not present

## 2021-08-09 DIAGNOSIS — N186 End stage renal disease: Secondary | ICD-10-CM | POA: Diagnosis not present

## 2021-08-09 DIAGNOSIS — Z992 Dependence on renal dialysis: Secondary | ICD-10-CM | POA: Diagnosis not present

## 2021-08-10 DIAGNOSIS — Z992 Dependence on renal dialysis: Secondary | ICD-10-CM | POA: Diagnosis not present

## 2021-08-10 DIAGNOSIS — T8610 Unspecified complication of kidney transplant: Secondary | ICD-10-CM | POA: Diagnosis not present

## 2021-08-10 DIAGNOSIS — N186 End stage renal disease: Secondary | ICD-10-CM | POA: Diagnosis not present

## 2021-08-12 DIAGNOSIS — Z992 Dependence on renal dialysis: Secondary | ICD-10-CM | POA: Diagnosis not present

## 2021-08-12 DIAGNOSIS — N186 End stage renal disease: Secondary | ICD-10-CM | POA: Diagnosis not present

## 2021-08-12 DIAGNOSIS — D631 Anemia in chronic kidney disease: Secondary | ICD-10-CM | POA: Diagnosis not present

## 2021-08-12 DIAGNOSIS — D688 Other specified coagulation defects: Secondary | ICD-10-CM | POA: Diagnosis not present

## 2021-08-12 DIAGNOSIS — R52 Pain, unspecified: Secondary | ICD-10-CM | POA: Diagnosis not present

## 2021-08-12 DIAGNOSIS — N2581 Secondary hyperparathyroidism of renal origin: Secondary | ICD-10-CM | POA: Diagnosis not present

## 2021-08-14 DIAGNOSIS — D688 Other specified coagulation defects: Secondary | ICD-10-CM | POA: Diagnosis not present

## 2021-08-14 DIAGNOSIS — R52 Pain, unspecified: Secondary | ICD-10-CM | POA: Diagnosis not present

## 2021-08-14 DIAGNOSIS — N186 End stage renal disease: Secondary | ICD-10-CM | POA: Diagnosis not present

## 2021-08-14 DIAGNOSIS — Z992 Dependence on renal dialysis: Secondary | ICD-10-CM | POA: Diagnosis not present

## 2021-08-14 DIAGNOSIS — D631 Anemia in chronic kidney disease: Secondary | ICD-10-CM | POA: Diagnosis not present

## 2021-08-14 DIAGNOSIS — N2581 Secondary hyperparathyroidism of renal origin: Secondary | ICD-10-CM | POA: Diagnosis not present

## 2021-08-16 DIAGNOSIS — D688 Other specified coagulation defects: Secondary | ICD-10-CM | POA: Diagnosis not present

## 2021-08-16 DIAGNOSIS — N2581 Secondary hyperparathyroidism of renal origin: Secondary | ICD-10-CM | POA: Diagnosis not present

## 2021-08-16 DIAGNOSIS — R52 Pain, unspecified: Secondary | ICD-10-CM | POA: Diagnosis not present

## 2021-08-16 DIAGNOSIS — Z992 Dependence on renal dialysis: Secondary | ICD-10-CM | POA: Diagnosis not present

## 2021-08-16 DIAGNOSIS — D631 Anemia in chronic kidney disease: Secondary | ICD-10-CM | POA: Diagnosis not present

## 2021-08-16 DIAGNOSIS — N186 End stage renal disease: Secondary | ICD-10-CM | POA: Diagnosis not present

## 2021-08-19 DIAGNOSIS — R52 Pain, unspecified: Secondary | ICD-10-CM | POA: Diagnosis not present

## 2021-08-19 DIAGNOSIS — D688 Other specified coagulation defects: Secondary | ICD-10-CM | POA: Diagnosis not present

## 2021-08-19 DIAGNOSIS — D631 Anemia in chronic kidney disease: Secondary | ICD-10-CM | POA: Diagnosis not present

## 2021-08-19 DIAGNOSIS — Z992 Dependence on renal dialysis: Secondary | ICD-10-CM | POA: Diagnosis not present

## 2021-08-19 DIAGNOSIS — N186 End stage renal disease: Secondary | ICD-10-CM | POA: Diagnosis not present

## 2021-08-19 DIAGNOSIS — N2581 Secondary hyperparathyroidism of renal origin: Secondary | ICD-10-CM | POA: Diagnosis not present

## 2021-08-21 DIAGNOSIS — N2581 Secondary hyperparathyroidism of renal origin: Secondary | ICD-10-CM | POA: Diagnosis not present

## 2021-08-21 DIAGNOSIS — Z992 Dependence on renal dialysis: Secondary | ICD-10-CM | POA: Diagnosis not present

## 2021-08-21 DIAGNOSIS — N186 End stage renal disease: Secondary | ICD-10-CM | POA: Diagnosis not present

## 2021-08-21 DIAGNOSIS — R52 Pain, unspecified: Secondary | ICD-10-CM | POA: Diagnosis not present

## 2021-08-21 DIAGNOSIS — D631 Anemia in chronic kidney disease: Secondary | ICD-10-CM | POA: Diagnosis not present

## 2021-08-21 DIAGNOSIS — D688 Other specified coagulation defects: Secondary | ICD-10-CM | POA: Diagnosis not present

## 2021-08-23 DIAGNOSIS — R52 Pain, unspecified: Secondary | ICD-10-CM | POA: Diagnosis not present

## 2021-08-23 DIAGNOSIS — D631 Anemia in chronic kidney disease: Secondary | ICD-10-CM | POA: Diagnosis not present

## 2021-08-23 DIAGNOSIS — Z992 Dependence on renal dialysis: Secondary | ICD-10-CM | POA: Diagnosis not present

## 2021-08-23 DIAGNOSIS — N186 End stage renal disease: Secondary | ICD-10-CM | POA: Diagnosis not present

## 2021-08-23 DIAGNOSIS — N2581 Secondary hyperparathyroidism of renal origin: Secondary | ICD-10-CM | POA: Diagnosis not present

## 2021-08-23 DIAGNOSIS — D688 Other specified coagulation defects: Secondary | ICD-10-CM | POA: Diagnosis not present

## 2021-08-26 DIAGNOSIS — D631 Anemia in chronic kidney disease: Secondary | ICD-10-CM | POA: Diagnosis not present

## 2021-08-26 DIAGNOSIS — D688 Other specified coagulation defects: Secondary | ICD-10-CM | POA: Diagnosis not present

## 2021-08-26 DIAGNOSIS — N2581 Secondary hyperparathyroidism of renal origin: Secondary | ICD-10-CM | POA: Diagnosis not present

## 2021-08-26 DIAGNOSIS — N186 End stage renal disease: Secondary | ICD-10-CM | POA: Diagnosis not present

## 2021-08-26 DIAGNOSIS — R52 Pain, unspecified: Secondary | ICD-10-CM | POA: Diagnosis not present

## 2021-08-26 DIAGNOSIS — Z992 Dependence on renal dialysis: Secondary | ICD-10-CM | POA: Diagnosis not present

## 2021-08-30 DIAGNOSIS — D631 Anemia in chronic kidney disease: Secondary | ICD-10-CM | POA: Diagnosis not present

## 2021-08-30 DIAGNOSIS — N2581 Secondary hyperparathyroidism of renal origin: Secondary | ICD-10-CM | POA: Diagnosis not present

## 2021-08-30 DIAGNOSIS — D688 Other specified coagulation defects: Secondary | ICD-10-CM | POA: Diagnosis not present

## 2021-08-30 DIAGNOSIS — R52 Pain, unspecified: Secondary | ICD-10-CM | POA: Diagnosis not present

## 2021-08-30 DIAGNOSIS — Z992 Dependence on renal dialysis: Secondary | ICD-10-CM | POA: Diagnosis not present

## 2021-08-30 DIAGNOSIS — N186 End stage renal disease: Secondary | ICD-10-CM | POA: Diagnosis not present

## 2021-09-02 DIAGNOSIS — Z992 Dependence on renal dialysis: Secondary | ICD-10-CM | POA: Diagnosis not present

## 2021-09-02 DIAGNOSIS — N186 End stage renal disease: Secondary | ICD-10-CM | POA: Diagnosis not present

## 2021-09-02 DIAGNOSIS — D688 Other specified coagulation defects: Secondary | ICD-10-CM | POA: Diagnosis not present

## 2021-09-02 DIAGNOSIS — R52 Pain, unspecified: Secondary | ICD-10-CM | POA: Diagnosis not present

## 2021-09-02 DIAGNOSIS — D631 Anemia in chronic kidney disease: Secondary | ICD-10-CM | POA: Diagnosis not present

## 2021-09-02 DIAGNOSIS — N2581 Secondary hyperparathyroidism of renal origin: Secondary | ICD-10-CM | POA: Diagnosis not present

## 2021-09-04 DIAGNOSIS — D631 Anemia in chronic kidney disease: Secondary | ICD-10-CM | POA: Diagnosis not present

## 2021-09-04 DIAGNOSIS — R52 Pain, unspecified: Secondary | ICD-10-CM | POA: Diagnosis not present

## 2021-09-04 DIAGNOSIS — Z992 Dependence on renal dialysis: Secondary | ICD-10-CM | POA: Diagnosis not present

## 2021-09-04 DIAGNOSIS — N2581 Secondary hyperparathyroidism of renal origin: Secondary | ICD-10-CM | POA: Diagnosis not present

## 2021-09-04 DIAGNOSIS — N186 End stage renal disease: Secondary | ICD-10-CM | POA: Diagnosis not present

## 2021-09-04 DIAGNOSIS — D688 Other specified coagulation defects: Secondary | ICD-10-CM | POA: Diagnosis not present

## 2021-09-06 DIAGNOSIS — N186 End stage renal disease: Secondary | ICD-10-CM | POA: Diagnosis not present

## 2021-09-06 DIAGNOSIS — N2581 Secondary hyperparathyroidism of renal origin: Secondary | ICD-10-CM | POA: Diagnosis not present

## 2021-09-06 DIAGNOSIS — Z992 Dependence on renal dialysis: Secondary | ICD-10-CM | POA: Diagnosis not present

## 2021-09-06 DIAGNOSIS — R52 Pain, unspecified: Secondary | ICD-10-CM | POA: Diagnosis not present

## 2021-09-06 DIAGNOSIS — D631 Anemia in chronic kidney disease: Secondary | ICD-10-CM | POA: Diagnosis not present

## 2021-09-06 DIAGNOSIS — D688 Other specified coagulation defects: Secondary | ICD-10-CM | POA: Diagnosis not present

## 2021-09-09 DIAGNOSIS — D631 Anemia in chronic kidney disease: Secondary | ICD-10-CM | POA: Diagnosis not present

## 2021-09-09 DIAGNOSIS — R52 Pain, unspecified: Secondary | ICD-10-CM | POA: Diagnosis not present

## 2021-09-09 DIAGNOSIS — D688 Other specified coagulation defects: Secondary | ICD-10-CM | POA: Diagnosis not present

## 2021-09-09 DIAGNOSIS — Z992 Dependence on renal dialysis: Secondary | ICD-10-CM | POA: Diagnosis not present

## 2021-09-09 DIAGNOSIS — N2581 Secondary hyperparathyroidism of renal origin: Secondary | ICD-10-CM | POA: Diagnosis not present

## 2021-09-09 DIAGNOSIS — N186 End stage renal disease: Secondary | ICD-10-CM | POA: Diagnosis not present

## 2021-09-10 DIAGNOSIS — T8610 Unspecified complication of kidney transplant: Secondary | ICD-10-CM | POA: Diagnosis not present

## 2021-09-10 DIAGNOSIS — N186 End stage renal disease: Secondary | ICD-10-CM | POA: Diagnosis not present

## 2021-09-10 DIAGNOSIS — Z992 Dependence on renal dialysis: Secondary | ICD-10-CM | POA: Diagnosis not present

## 2021-09-10 DIAGNOSIS — Z452 Encounter for adjustment and management of vascular access device: Secondary | ICD-10-CM | POA: Diagnosis not present

## 2021-09-11 DIAGNOSIS — D509 Iron deficiency anemia, unspecified: Secondary | ICD-10-CM | POA: Diagnosis not present

## 2021-09-11 DIAGNOSIS — Z992 Dependence on renal dialysis: Secondary | ICD-10-CM | POA: Diagnosis not present

## 2021-09-11 DIAGNOSIS — D631 Anemia in chronic kidney disease: Secondary | ICD-10-CM | POA: Diagnosis not present

## 2021-09-11 DIAGNOSIS — D688 Other specified coagulation defects: Secondary | ICD-10-CM | POA: Diagnosis not present

## 2021-09-11 DIAGNOSIS — N186 End stage renal disease: Secondary | ICD-10-CM | POA: Diagnosis not present

## 2021-09-11 DIAGNOSIS — N2581 Secondary hyperparathyroidism of renal origin: Secondary | ICD-10-CM | POA: Diagnosis not present

## 2021-09-13 DIAGNOSIS — N2581 Secondary hyperparathyroidism of renal origin: Secondary | ICD-10-CM | POA: Diagnosis not present

## 2021-09-13 DIAGNOSIS — D688 Other specified coagulation defects: Secondary | ICD-10-CM | POA: Diagnosis not present

## 2021-09-13 DIAGNOSIS — Z992 Dependence on renal dialysis: Secondary | ICD-10-CM | POA: Diagnosis not present

## 2021-09-13 DIAGNOSIS — N186 End stage renal disease: Secondary | ICD-10-CM | POA: Diagnosis not present

## 2021-09-13 DIAGNOSIS — D509 Iron deficiency anemia, unspecified: Secondary | ICD-10-CM | POA: Diagnosis not present

## 2021-09-13 DIAGNOSIS — D631 Anemia in chronic kidney disease: Secondary | ICD-10-CM | POA: Diagnosis not present

## 2021-09-16 DIAGNOSIS — D688 Other specified coagulation defects: Secondary | ICD-10-CM | POA: Diagnosis not present

## 2021-09-16 DIAGNOSIS — N186 End stage renal disease: Secondary | ICD-10-CM | POA: Diagnosis not present

## 2021-09-16 DIAGNOSIS — D509 Iron deficiency anemia, unspecified: Secondary | ICD-10-CM | POA: Diagnosis not present

## 2021-09-16 DIAGNOSIS — N2581 Secondary hyperparathyroidism of renal origin: Secondary | ICD-10-CM | POA: Diagnosis not present

## 2021-09-16 DIAGNOSIS — Z992 Dependence on renal dialysis: Secondary | ICD-10-CM | POA: Diagnosis not present

## 2021-09-16 DIAGNOSIS — D631 Anemia in chronic kidney disease: Secondary | ICD-10-CM | POA: Diagnosis not present

## 2021-09-18 DIAGNOSIS — D688 Other specified coagulation defects: Secondary | ICD-10-CM | POA: Diagnosis not present

## 2021-09-18 DIAGNOSIS — N186 End stage renal disease: Secondary | ICD-10-CM | POA: Diagnosis not present

## 2021-09-18 DIAGNOSIS — D631 Anemia in chronic kidney disease: Secondary | ICD-10-CM | POA: Diagnosis not present

## 2021-09-18 DIAGNOSIS — D509 Iron deficiency anemia, unspecified: Secondary | ICD-10-CM | POA: Diagnosis not present

## 2021-09-18 DIAGNOSIS — N2581 Secondary hyperparathyroidism of renal origin: Secondary | ICD-10-CM | POA: Diagnosis not present

## 2021-09-18 DIAGNOSIS — Z992 Dependence on renal dialysis: Secondary | ICD-10-CM | POA: Diagnosis not present

## 2021-09-20 DIAGNOSIS — D509 Iron deficiency anemia, unspecified: Secondary | ICD-10-CM | POA: Diagnosis not present

## 2021-09-20 DIAGNOSIS — N2581 Secondary hyperparathyroidism of renal origin: Secondary | ICD-10-CM | POA: Diagnosis not present

## 2021-09-20 DIAGNOSIS — N186 End stage renal disease: Secondary | ICD-10-CM | POA: Diagnosis not present

## 2021-09-20 DIAGNOSIS — D631 Anemia in chronic kidney disease: Secondary | ICD-10-CM | POA: Diagnosis not present

## 2021-09-20 DIAGNOSIS — D688 Other specified coagulation defects: Secondary | ICD-10-CM | POA: Diagnosis not present

## 2021-09-20 DIAGNOSIS — Z992 Dependence on renal dialysis: Secondary | ICD-10-CM | POA: Diagnosis not present

## 2021-09-23 DIAGNOSIS — N2581 Secondary hyperparathyroidism of renal origin: Secondary | ICD-10-CM | POA: Diagnosis not present

## 2021-09-23 DIAGNOSIS — D688 Other specified coagulation defects: Secondary | ICD-10-CM | POA: Diagnosis not present

## 2021-09-23 DIAGNOSIS — Z992 Dependence on renal dialysis: Secondary | ICD-10-CM | POA: Diagnosis not present

## 2021-09-23 DIAGNOSIS — D631 Anemia in chronic kidney disease: Secondary | ICD-10-CM | POA: Diagnosis not present

## 2021-09-23 DIAGNOSIS — N186 End stage renal disease: Secondary | ICD-10-CM | POA: Diagnosis not present

## 2021-09-23 DIAGNOSIS — D509 Iron deficiency anemia, unspecified: Secondary | ICD-10-CM | POA: Diagnosis not present

## 2021-09-25 DIAGNOSIS — D631 Anemia in chronic kidney disease: Secondary | ICD-10-CM | POA: Diagnosis not present

## 2021-09-25 DIAGNOSIS — Z992 Dependence on renal dialysis: Secondary | ICD-10-CM | POA: Diagnosis not present

## 2021-09-25 DIAGNOSIS — N186 End stage renal disease: Secondary | ICD-10-CM | POA: Diagnosis not present

## 2021-09-25 DIAGNOSIS — D509 Iron deficiency anemia, unspecified: Secondary | ICD-10-CM | POA: Diagnosis not present

## 2021-09-25 DIAGNOSIS — D688 Other specified coagulation defects: Secondary | ICD-10-CM | POA: Diagnosis not present

## 2021-09-25 DIAGNOSIS — N2581 Secondary hyperparathyroidism of renal origin: Secondary | ICD-10-CM | POA: Diagnosis not present

## 2021-09-27 DIAGNOSIS — N186 End stage renal disease: Secondary | ICD-10-CM | POA: Diagnosis not present

## 2021-09-27 DIAGNOSIS — Z992 Dependence on renal dialysis: Secondary | ICD-10-CM | POA: Diagnosis not present

## 2021-09-27 DIAGNOSIS — D688 Other specified coagulation defects: Secondary | ICD-10-CM | POA: Diagnosis not present

## 2021-09-27 DIAGNOSIS — D631 Anemia in chronic kidney disease: Secondary | ICD-10-CM | POA: Diagnosis not present

## 2021-09-27 DIAGNOSIS — D509 Iron deficiency anemia, unspecified: Secondary | ICD-10-CM | POA: Diagnosis not present

## 2021-09-27 DIAGNOSIS — N2581 Secondary hyperparathyroidism of renal origin: Secondary | ICD-10-CM | POA: Diagnosis not present

## 2021-09-30 DIAGNOSIS — D688 Other specified coagulation defects: Secondary | ICD-10-CM | POA: Diagnosis not present

## 2021-09-30 DIAGNOSIS — Z992 Dependence on renal dialysis: Secondary | ICD-10-CM | POA: Diagnosis not present

## 2021-09-30 DIAGNOSIS — N186 End stage renal disease: Secondary | ICD-10-CM | POA: Diagnosis not present

## 2021-09-30 DIAGNOSIS — N2581 Secondary hyperparathyroidism of renal origin: Secondary | ICD-10-CM | POA: Diagnosis not present

## 2021-09-30 DIAGNOSIS — D631 Anemia in chronic kidney disease: Secondary | ICD-10-CM | POA: Diagnosis not present

## 2021-09-30 DIAGNOSIS — D509 Iron deficiency anemia, unspecified: Secondary | ICD-10-CM | POA: Diagnosis not present

## 2021-10-01 DIAGNOSIS — I700 Atherosclerosis of aorta: Secondary | ICD-10-CM | POA: Diagnosis not present

## 2021-10-01 DIAGNOSIS — Z7682 Awaiting organ transplant status: Secondary | ICD-10-CM | POA: Diagnosis not present

## 2021-10-01 DIAGNOSIS — Z01810 Encounter for preprocedural cardiovascular examination: Secondary | ICD-10-CM | POA: Diagnosis not present

## 2021-10-01 DIAGNOSIS — R935 Abnormal findings on diagnostic imaging of other abdominal regions, including retroperitoneum: Secondary | ICD-10-CM | POA: Diagnosis not present

## 2021-10-02 DIAGNOSIS — D631 Anemia in chronic kidney disease: Secondary | ICD-10-CM | POA: Diagnosis not present

## 2021-10-02 DIAGNOSIS — Z992 Dependence on renal dialysis: Secondary | ICD-10-CM | POA: Diagnosis not present

## 2021-10-02 DIAGNOSIS — D509 Iron deficiency anemia, unspecified: Secondary | ICD-10-CM | POA: Diagnosis not present

## 2021-10-02 DIAGNOSIS — N186 End stage renal disease: Secondary | ICD-10-CM | POA: Diagnosis not present

## 2021-10-02 DIAGNOSIS — N2581 Secondary hyperparathyroidism of renal origin: Secondary | ICD-10-CM | POA: Diagnosis not present

## 2021-10-02 DIAGNOSIS — D688 Other specified coagulation defects: Secondary | ICD-10-CM | POA: Diagnosis not present

## 2021-10-04 DIAGNOSIS — N2581 Secondary hyperparathyroidism of renal origin: Secondary | ICD-10-CM | POA: Diagnosis not present

## 2021-10-04 DIAGNOSIS — N186 End stage renal disease: Secondary | ICD-10-CM | POA: Diagnosis not present

## 2021-10-04 DIAGNOSIS — D631 Anemia in chronic kidney disease: Secondary | ICD-10-CM | POA: Diagnosis not present

## 2021-10-04 DIAGNOSIS — D688 Other specified coagulation defects: Secondary | ICD-10-CM | POA: Diagnosis not present

## 2021-10-04 DIAGNOSIS — D509 Iron deficiency anemia, unspecified: Secondary | ICD-10-CM | POA: Diagnosis not present

## 2021-10-04 DIAGNOSIS — Z992 Dependence on renal dialysis: Secondary | ICD-10-CM | POA: Diagnosis not present

## 2021-10-07 DIAGNOSIS — Z992 Dependence on renal dialysis: Secondary | ICD-10-CM | POA: Diagnosis not present

## 2021-10-07 DIAGNOSIS — D631 Anemia in chronic kidney disease: Secondary | ICD-10-CM | POA: Diagnosis not present

## 2021-10-07 DIAGNOSIS — N186 End stage renal disease: Secondary | ICD-10-CM | POA: Diagnosis not present

## 2021-10-07 DIAGNOSIS — N2581 Secondary hyperparathyroidism of renal origin: Secondary | ICD-10-CM | POA: Diagnosis not present

## 2021-10-07 DIAGNOSIS — D509 Iron deficiency anemia, unspecified: Secondary | ICD-10-CM | POA: Diagnosis not present

## 2021-10-07 DIAGNOSIS — D688 Other specified coagulation defects: Secondary | ICD-10-CM | POA: Diagnosis not present

## 2021-10-08 DIAGNOSIS — Z992 Dependence on renal dialysis: Secondary | ICD-10-CM | POA: Diagnosis not present

## 2021-10-08 DIAGNOSIS — T8610 Unspecified complication of kidney transplant: Secondary | ICD-10-CM | POA: Diagnosis not present

## 2021-10-08 DIAGNOSIS — N186 End stage renal disease: Secondary | ICD-10-CM | POA: Diagnosis not present

## 2021-10-09 DIAGNOSIS — D688 Other specified coagulation defects: Secondary | ICD-10-CM | POA: Diagnosis not present

## 2021-10-09 DIAGNOSIS — D509 Iron deficiency anemia, unspecified: Secondary | ICD-10-CM | POA: Diagnosis not present

## 2021-10-09 DIAGNOSIS — Z992 Dependence on renal dialysis: Secondary | ICD-10-CM | POA: Diagnosis not present

## 2021-10-09 DIAGNOSIS — N2581 Secondary hyperparathyroidism of renal origin: Secondary | ICD-10-CM | POA: Diagnosis not present

## 2021-10-09 DIAGNOSIS — N186 End stage renal disease: Secondary | ICD-10-CM | POA: Diagnosis not present

## 2021-10-09 DIAGNOSIS — D631 Anemia in chronic kidney disease: Secondary | ICD-10-CM | POA: Diagnosis not present

## 2021-10-11 DIAGNOSIS — D631 Anemia in chronic kidney disease: Secondary | ICD-10-CM | POA: Diagnosis not present

## 2021-10-11 DIAGNOSIS — D509 Iron deficiency anemia, unspecified: Secondary | ICD-10-CM | POA: Diagnosis not present

## 2021-10-11 DIAGNOSIS — Z992 Dependence on renal dialysis: Secondary | ICD-10-CM | POA: Diagnosis not present

## 2021-10-11 DIAGNOSIS — N186 End stage renal disease: Secondary | ICD-10-CM | POA: Diagnosis not present

## 2021-10-11 DIAGNOSIS — D688 Other specified coagulation defects: Secondary | ICD-10-CM | POA: Diagnosis not present

## 2021-10-11 DIAGNOSIS — N2581 Secondary hyperparathyroidism of renal origin: Secondary | ICD-10-CM | POA: Diagnosis not present

## 2021-10-14 DIAGNOSIS — D509 Iron deficiency anemia, unspecified: Secondary | ICD-10-CM | POA: Diagnosis not present

## 2021-10-14 DIAGNOSIS — N2581 Secondary hyperparathyroidism of renal origin: Secondary | ICD-10-CM | POA: Diagnosis not present

## 2021-10-14 DIAGNOSIS — D631 Anemia in chronic kidney disease: Secondary | ICD-10-CM | POA: Diagnosis not present

## 2021-10-14 DIAGNOSIS — D688 Other specified coagulation defects: Secondary | ICD-10-CM | POA: Diagnosis not present

## 2021-10-14 DIAGNOSIS — Z992 Dependence on renal dialysis: Secondary | ICD-10-CM | POA: Diagnosis not present

## 2021-10-14 DIAGNOSIS — N186 End stage renal disease: Secondary | ICD-10-CM | POA: Diagnosis not present

## 2021-10-16 DIAGNOSIS — D509 Iron deficiency anemia, unspecified: Secondary | ICD-10-CM | POA: Diagnosis not present

## 2021-10-16 DIAGNOSIS — D631 Anemia in chronic kidney disease: Secondary | ICD-10-CM | POA: Diagnosis not present

## 2021-10-16 DIAGNOSIS — Z992 Dependence on renal dialysis: Secondary | ICD-10-CM | POA: Diagnosis not present

## 2021-10-16 DIAGNOSIS — N2581 Secondary hyperparathyroidism of renal origin: Secondary | ICD-10-CM | POA: Diagnosis not present

## 2021-10-16 DIAGNOSIS — N186 End stage renal disease: Secondary | ICD-10-CM | POA: Diagnosis not present

## 2021-10-16 DIAGNOSIS — D688 Other specified coagulation defects: Secondary | ICD-10-CM | POA: Diagnosis not present

## 2021-10-18 DIAGNOSIS — D509 Iron deficiency anemia, unspecified: Secondary | ICD-10-CM | POA: Diagnosis not present

## 2021-10-18 DIAGNOSIS — D631 Anemia in chronic kidney disease: Secondary | ICD-10-CM | POA: Diagnosis not present

## 2021-10-18 DIAGNOSIS — Z992 Dependence on renal dialysis: Secondary | ICD-10-CM | POA: Diagnosis not present

## 2021-10-18 DIAGNOSIS — D688 Other specified coagulation defects: Secondary | ICD-10-CM | POA: Diagnosis not present

## 2021-10-18 DIAGNOSIS — N2581 Secondary hyperparathyroidism of renal origin: Secondary | ICD-10-CM | POA: Diagnosis not present

## 2021-10-18 DIAGNOSIS — N186 End stage renal disease: Secondary | ICD-10-CM | POA: Diagnosis not present

## 2021-10-21 DIAGNOSIS — D688 Other specified coagulation defects: Secondary | ICD-10-CM | POA: Diagnosis not present

## 2021-10-21 DIAGNOSIS — Z992 Dependence on renal dialysis: Secondary | ICD-10-CM | POA: Diagnosis not present

## 2021-10-21 DIAGNOSIS — D631 Anemia in chronic kidney disease: Secondary | ICD-10-CM | POA: Diagnosis not present

## 2021-10-21 DIAGNOSIS — N186 End stage renal disease: Secondary | ICD-10-CM | POA: Diagnosis not present

## 2021-10-21 DIAGNOSIS — D509 Iron deficiency anemia, unspecified: Secondary | ICD-10-CM | POA: Diagnosis not present

## 2021-10-21 DIAGNOSIS — N2581 Secondary hyperparathyroidism of renal origin: Secondary | ICD-10-CM | POA: Diagnosis not present

## 2021-10-23 DIAGNOSIS — Z992 Dependence on renal dialysis: Secondary | ICD-10-CM | POA: Diagnosis not present

## 2021-10-23 DIAGNOSIS — N186 End stage renal disease: Secondary | ICD-10-CM | POA: Diagnosis not present

## 2021-10-23 DIAGNOSIS — D688 Other specified coagulation defects: Secondary | ICD-10-CM | POA: Diagnosis not present

## 2021-10-23 DIAGNOSIS — D509 Iron deficiency anemia, unspecified: Secondary | ICD-10-CM | POA: Diagnosis not present

## 2021-10-23 DIAGNOSIS — N2581 Secondary hyperparathyroidism of renal origin: Secondary | ICD-10-CM | POA: Diagnosis not present

## 2021-10-23 DIAGNOSIS — D631 Anemia in chronic kidney disease: Secondary | ICD-10-CM | POA: Diagnosis not present

## 2021-10-25 DIAGNOSIS — D509 Iron deficiency anemia, unspecified: Secondary | ICD-10-CM | POA: Diagnosis not present

## 2021-10-25 DIAGNOSIS — D688 Other specified coagulation defects: Secondary | ICD-10-CM | POA: Diagnosis not present

## 2021-10-25 DIAGNOSIS — Z992 Dependence on renal dialysis: Secondary | ICD-10-CM | POA: Diagnosis not present

## 2021-10-25 DIAGNOSIS — N2581 Secondary hyperparathyroidism of renal origin: Secondary | ICD-10-CM | POA: Diagnosis not present

## 2021-10-25 DIAGNOSIS — N186 End stage renal disease: Secondary | ICD-10-CM | POA: Diagnosis not present

## 2021-10-25 DIAGNOSIS — D631 Anemia in chronic kidney disease: Secondary | ICD-10-CM | POA: Diagnosis not present

## 2021-10-28 DIAGNOSIS — D688 Other specified coagulation defects: Secondary | ICD-10-CM | POA: Diagnosis not present

## 2021-10-28 DIAGNOSIS — D631 Anemia in chronic kidney disease: Secondary | ICD-10-CM | POA: Diagnosis not present

## 2021-10-28 DIAGNOSIS — Z992 Dependence on renal dialysis: Secondary | ICD-10-CM | POA: Diagnosis not present

## 2021-10-28 DIAGNOSIS — D509 Iron deficiency anemia, unspecified: Secondary | ICD-10-CM | POA: Diagnosis not present

## 2021-10-28 DIAGNOSIS — N2581 Secondary hyperparathyroidism of renal origin: Secondary | ICD-10-CM | POA: Diagnosis not present

## 2021-10-28 DIAGNOSIS — N186 End stage renal disease: Secondary | ICD-10-CM | POA: Diagnosis not present

## 2021-10-30 DIAGNOSIS — D631 Anemia in chronic kidney disease: Secondary | ICD-10-CM | POA: Diagnosis not present

## 2021-10-30 DIAGNOSIS — D509 Iron deficiency anemia, unspecified: Secondary | ICD-10-CM | POA: Diagnosis not present

## 2021-10-30 DIAGNOSIS — D688 Other specified coagulation defects: Secondary | ICD-10-CM | POA: Diagnosis not present

## 2021-10-30 DIAGNOSIS — Z992 Dependence on renal dialysis: Secondary | ICD-10-CM | POA: Diagnosis not present

## 2021-10-30 DIAGNOSIS — N186 End stage renal disease: Secondary | ICD-10-CM | POA: Diagnosis not present

## 2021-10-30 DIAGNOSIS — N2581 Secondary hyperparathyroidism of renal origin: Secondary | ICD-10-CM | POA: Diagnosis not present

## 2021-11-01 DIAGNOSIS — Z992 Dependence on renal dialysis: Secondary | ICD-10-CM | POA: Diagnosis not present

## 2021-11-01 DIAGNOSIS — D631 Anemia in chronic kidney disease: Secondary | ICD-10-CM | POA: Diagnosis not present

## 2021-11-01 DIAGNOSIS — D509 Iron deficiency anemia, unspecified: Secondary | ICD-10-CM | POA: Diagnosis not present

## 2021-11-01 DIAGNOSIS — N2581 Secondary hyperparathyroidism of renal origin: Secondary | ICD-10-CM | POA: Diagnosis not present

## 2021-11-01 DIAGNOSIS — N186 End stage renal disease: Secondary | ICD-10-CM | POA: Diagnosis not present

## 2021-11-01 DIAGNOSIS — D688 Other specified coagulation defects: Secondary | ICD-10-CM | POA: Diagnosis not present

## 2021-11-04 DIAGNOSIS — D688 Other specified coagulation defects: Secondary | ICD-10-CM | POA: Diagnosis not present

## 2021-11-04 DIAGNOSIS — D509 Iron deficiency anemia, unspecified: Secondary | ICD-10-CM | POA: Diagnosis not present

## 2021-11-04 DIAGNOSIS — Z992 Dependence on renal dialysis: Secondary | ICD-10-CM | POA: Diagnosis not present

## 2021-11-04 DIAGNOSIS — D631 Anemia in chronic kidney disease: Secondary | ICD-10-CM | POA: Diagnosis not present

## 2021-11-04 DIAGNOSIS — N2581 Secondary hyperparathyroidism of renal origin: Secondary | ICD-10-CM | POA: Diagnosis not present

## 2021-11-04 DIAGNOSIS — N186 End stage renal disease: Secondary | ICD-10-CM | POA: Diagnosis not present

## 2021-11-06 DIAGNOSIS — N186 End stage renal disease: Secondary | ICD-10-CM | POA: Diagnosis not present

## 2021-11-06 DIAGNOSIS — Z992 Dependence on renal dialysis: Secondary | ICD-10-CM | POA: Diagnosis not present

## 2021-11-06 DIAGNOSIS — D631 Anemia in chronic kidney disease: Secondary | ICD-10-CM | POA: Diagnosis not present

## 2021-11-06 DIAGNOSIS — N2581 Secondary hyperparathyroidism of renal origin: Secondary | ICD-10-CM | POA: Diagnosis not present

## 2021-11-06 DIAGNOSIS — D688 Other specified coagulation defects: Secondary | ICD-10-CM | POA: Diagnosis not present

## 2021-11-08 DIAGNOSIS — T8610 Unspecified complication of kidney transplant: Secondary | ICD-10-CM | POA: Diagnosis not present

## 2021-11-08 DIAGNOSIS — D631 Anemia in chronic kidney disease: Secondary | ICD-10-CM | POA: Diagnosis not present

## 2021-11-08 DIAGNOSIS — N186 End stage renal disease: Secondary | ICD-10-CM | POA: Diagnosis not present

## 2021-11-08 DIAGNOSIS — Z992 Dependence on renal dialysis: Secondary | ICD-10-CM | POA: Diagnosis not present

## 2021-11-08 DIAGNOSIS — D688 Other specified coagulation defects: Secondary | ICD-10-CM | POA: Diagnosis not present

## 2021-11-08 DIAGNOSIS — N2581 Secondary hyperparathyroidism of renal origin: Secondary | ICD-10-CM | POA: Diagnosis not present

## 2021-11-11 DIAGNOSIS — D688 Other specified coagulation defects: Secondary | ICD-10-CM | POA: Diagnosis not present

## 2021-11-11 DIAGNOSIS — N2581 Secondary hyperparathyroidism of renal origin: Secondary | ICD-10-CM | POA: Diagnosis not present

## 2021-11-11 DIAGNOSIS — N186 End stage renal disease: Secondary | ICD-10-CM | POA: Diagnosis not present

## 2021-11-11 DIAGNOSIS — Z992 Dependence on renal dialysis: Secondary | ICD-10-CM | POA: Diagnosis not present

## 2021-11-11 DIAGNOSIS — D509 Iron deficiency anemia, unspecified: Secondary | ICD-10-CM | POA: Diagnosis not present

## 2021-11-13 DIAGNOSIS — N186 End stage renal disease: Secondary | ICD-10-CM | POA: Diagnosis not present

## 2021-11-13 DIAGNOSIS — D688 Other specified coagulation defects: Secondary | ICD-10-CM | POA: Diagnosis not present

## 2021-11-13 DIAGNOSIS — N2581 Secondary hyperparathyroidism of renal origin: Secondary | ICD-10-CM | POA: Diagnosis not present

## 2021-11-13 DIAGNOSIS — Z992 Dependence on renal dialysis: Secondary | ICD-10-CM | POA: Diagnosis not present

## 2021-11-13 DIAGNOSIS — D509 Iron deficiency anemia, unspecified: Secondary | ICD-10-CM | POA: Diagnosis not present

## 2021-11-15 DIAGNOSIS — D688 Other specified coagulation defects: Secondary | ICD-10-CM | POA: Diagnosis not present

## 2021-11-15 DIAGNOSIS — N2581 Secondary hyperparathyroidism of renal origin: Secondary | ICD-10-CM | POA: Diagnosis not present

## 2021-11-15 DIAGNOSIS — D509 Iron deficiency anemia, unspecified: Secondary | ICD-10-CM | POA: Diagnosis not present

## 2021-11-15 DIAGNOSIS — Z992 Dependence on renal dialysis: Secondary | ICD-10-CM | POA: Diagnosis not present

## 2021-11-15 DIAGNOSIS — N186 End stage renal disease: Secondary | ICD-10-CM | POA: Diagnosis not present

## 2021-11-18 DIAGNOSIS — N186 End stage renal disease: Secondary | ICD-10-CM | POA: Diagnosis not present

## 2021-11-18 DIAGNOSIS — D509 Iron deficiency anemia, unspecified: Secondary | ICD-10-CM | POA: Diagnosis not present

## 2021-11-18 DIAGNOSIS — D688 Other specified coagulation defects: Secondary | ICD-10-CM | POA: Diagnosis not present

## 2021-11-18 DIAGNOSIS — Z992 Dependence on renal dialysis: Secondary | ICD-10-CM | POA: Diagnosis not present

## 2021-11-18 DIAGNOSIS — N2581 Secondary hyperparathyroidism of renal origin: Secondary | ICD-10-CM | POA: Diagnosis not present

## 2021-11-19 LAB — HEMOGLOBIN A1C: Hemoglobin A1C: 4.7

## 2021-11-20 DIAGNOSIS — N2581 Secondary hyperparathyroidism of renal origin: Secondary | ICD-10-CM | POA: Diagnosis not present

## 2021-11-20 DIAGNOSIS — N186 End stage renal disease: Secondary | ICD-10-CM | POA: Diagnosis not present

## 2021-11-20 DIAGNOSIS — D688 Other specified coagulation defects: Secondary | ICD-10-CM | POA: Diagnosis not present

## 2021-11-20 DIAGNOSIS — Z992 Dependence on renal dialysis: Secondary | ICD-10-CM | POA: Diagnosis not present

## 2021-11-20 DIAGNOSIS — D509 Iron deficiency anemia, unspecified: Secondary | ICD-10-CM | POA: Diagnosis not present

## 2021-11-22 DIAGNOSIS — N2581 Secondary hyperparathyroidism of renal origin: Secondary | ICD-10-CM | POA: Diagnosis not present

## 2021-11-22 DIAGNOSIS — N186 End stage renal disease: Secondary | ICD-10-CM | POA: Diagnosis not present

## 2021-11-22 DIAGNOSIS — Z992 Dependence on renal dialysis: Secondary | ICD-10-CM | POA: Diagnosis not present

## 2021-11-22 DIAGNOSIS — D688 Other specified coagulation defects: Secondary | ICD-10-CM | POA: Diagnosis not present

## 2021-11-22 DIAGNOSIS — D509 Iron deficiency anemia, unspecified: Secondary | ICD-10-CM | POA: Diagnosis not present

## 2021-11-25 DIAGNOSIS — D688 Other specified coagulation defects: Secondary | ICD-10-CM | POA: Diagnosis not present

## 2021-11-25 DIAGNOSIS — D509 Iron deficiency anemia, unspecified: Secondary | ICD-10-CM | POA: Diagnosis not present

## 2021-11-25 DIAGNOSIS — N186 End stage renal disease: Secondary | ICD-10-CM | POA: Diagnosis not present

## 2021-11-25 DIAGNOSIS — Z992 Dependence on renal dialysis: Secondary | ICD-10-CM | POA: Diagnosis not present

## 2021-11-25 DIAGNOSIS — N2581 Secondary hyperparathyroidism of renal origin: Secondary | ICD-10-CM | POA: Diagnosis not present

## 2021-11-27 DIAGNOSIS — Z992 Dependence on renal dialysis: Secondary | ICD-10-CM | POA: Diagnosis not present

## 2021-11-27 DIAGNOSIS — N186 End stage renal disease: Secondary | ICD-10-CM | POA: Diagnosis not present

## 2021-11-27 DIAGNOSIS — D509 Iron deficiency anemia, unspecified: Secondary | ICD-10-CM | POA: Diagnosis not present

## 2021-11-27 DIAGNOSIS — N2581 Secondary hyperparathyroidism of renal origin: Secondary | ICD-10-CM | POA: Diagnosis not present

## 2021-11-27 DIAGNOSIS — D688 Other specified coagulation defects: Secondary | ICD-10-CM | POA: Diagnosis not present

## 2021-11-29 DIAGNOSIS — D688 Other specified coagulation defects: Secondary | ICD-10-CM | POA: Diagnosis not present

## 2021-11-29 DIAGNOSIS — Z992 Dependence on renal dialysis: Secondary | ICD-10-CM | POA: Diagnosis not present

## 2021-11-29 DIAGNOSIS — N186 End stage renal disease: Secondary | ICD-10-CM | POA: Diagnosis not present

## 2021-11-29 DIAGNOSIS — D509 Iron deficiency anemia, unspecified: Secondary | ICD-10-CM | POA: Diagnosis not present

## 2021-11-29 DIAGNOSIS — N2581 Secondary hyperparathyroidism of renal origin: Secondary | ICD-10-CM | POA: Diagnosis not present

## 2021-12-02 DIAGNOSIS — N2581 Secondary hyperparathyroidism of renal origin: Secondary | ICD-10-CM | POA: Diagnosis not present

## 2021-12-02 DIAGNOSIS — D688 Other specified coagulation defects: Secondary | ICD-10-CM | POA: Diagnosis not present

## 2021-12-02 DIAGNOSIS — N186 End stage renal disease: Secondary | ICD-10-CM | POA: Diagnosis not present

## 2021-12-02 DIAGNOSIS — Z992 Dependence on renal dialysis: Secondary | ICD-10-CM | POA: Diagnosis not present

## 2021-12-02 DIAGNOSIS — D509 Iron deficiency anemia, unspecified: Secondary | ICD-10-CM | POA: Diagnosis not present

## 2021-12-04 DIAGNOSIS — D688 Other specified coagulation defects: Secondary | ICD-10-CM | POA: Diagnosis not present

## 2021-12-04 DIAGNOSIS — N2581 Secondary hyperparathyroidism of renal origin: Secondary | ICD-10-CM | POA: Diagnosis not present

## 2021-12-04 DIAGNOSIS — D509 Iron deficiency anemia, unspecified: Secondary | ICD-10-CM | POA: Diagnosis not present

## 2021-12-04 DIAGNOSIS — Z992 Dependence on renal dialysis: Secondary | ICD-10-CM | POA: Diagnosis not present

## 2021-12-04 DIAGNOSIS — N186 End stage renal disease: Secondary | ICD-10-CM | POA: Diagnosis not present

## 2021-12-06 DIAGNOSIS — Z992 Dependence on renal dialysis: Secondary | ICD-10-CM | POA: Diagnosis not present

## 2021-12-06 DIAGNOSIS — N186 End stage renal disease: Secondary | ICD-10-CM | POA: Diagnosis not present

## 2021-12-06 DIAGNOSIS — D509 Iron deficiency anemia, unspecified: Secondary | ICD-10-CM | POA: Diagnosis not present

## 2021-12-06 DIAGNOSIS — N2581 Secondary hyperparathyroidism of renal origin: Secondary | ICD-10-CM | POA: Diagnosis not present

## 2021-12-06 DIAGNOSIS — D688 Other specified coagulation defects: Secondary | ICD-10-CM | POA: Diagnosis not present

## 2021-12-08 DIAGNOSIS — N186 End stage renal disease: Secondary | ICD-10-CM | POA: Diagnosis not present

## 2021-12-08 DIAGNOSIS — T8610 Unspecified complication of kidney transplant: Secondary | ICD-10-CM | POA: Diagnosis not present

## 2021-12-08 DIAGNOSIS — Z992 Dependence on renal dialysis: Secondary | ICD-10-CM | POA: Diagnosis not present

## 2021-12-09 DIAGNOSIS — N186 End stage renal disease: Secondary | ICD-10-CM | POA: Diagnosis not present

## 2021-12-09 DIAGNOSIS — N2581 Secondary hyperparathyroidism of renal origin: Secondary | ICD-10-CM | POA: Diagnosis not present

## 2021-12-09 DIAGNOSIS — D509 Iron deficiency anemia, unspecified: Secondary | ICD-10-CM | POA: Diagnosis not present

## 2021-12-09 DIAGNOSIS — Z992 Dependence on renal dialysis: Secondary | ICD-10-CM | POA: Diagnosis not present

## 2021-12-09 DIAGNOSIS — R52 Pain, unspecified: Secondary | ICD-10-CM | POA: Diagnosis not present

## 2021-12-09 DIAGNOSIS — D631 Anemia in chronic kidney disease: Secondary | ICD-10-CM | POA: Diagnosis not present

## 2021-12-09 DIAGNOSIS — D688 Other specified coagulation defects: Secondary | ICD-10-CM | POA: Diagnosis not present

## 2021-12-11 DIAGNOSIS — R52 Pain, unspecified: Secondary | ICD-10-CM | POA: Diagnosis not present

## 2021-12-11 DIAGNOSIS — D509 Iron deficiency anemia, unspecified: Secondary | ICD-10-CM | POA: Diagnosis not present

## 2021-12-11 DIAGNOSIS — D631 Anemia in chronic kidney disease: Secondary | ICD-10-CM | POA: Diagnosis not present

## 2021-12-11 DIAGNOSIS — Z992 Dependence on renal dialysis: Secondary | ICD-10-CM | POA: Diagnosis not present

## 2021-12-11 DIAGNOSIS — N2581 Secondary hyperparathyroidism of renal origin: Secondary | ICD-10-CM | POA: Diagnosis not present

## 2021-12-11 DIAGNOSIS — N186 End stage renal disease: Secondary | ICD-10-CM | POA: Diagnosis not present

## 2021-12-11 DIAGNOSIS — D688 Other specified coagulation defects: Secondary | ICD-10-CM | POA: Diagnosis not present

## 2021-12-13 DIAGNOSIS — D688 Other specified coagulation defects: Secondary | ICD-10-CM | POA: Diagnosis not present

## 2021-12-13 DIAGNOSIS — D631 Anemia in chronic kidney disease: Secondary | ICD-10-CM | POA: Diagnosis not present

## 2021-12-13 DIAGNOSIS — R52 Pain, unspecified: Secondary | ICD-10-CM | POA: Diagnosis not present

## 2021-12-13 DIAGNOSIS — D509 Iron deficiency anemia, unspecified: Secondary | ICD-10-CM | POA: Diagnosis not present

## 2021-12-13 DIAGNOSIS — N2581 Secondary hyperparathyroidism of renal origin: Secondary | ICD-10-CM | POA: Diagnosis not present

## 2021-12-13 DIAGNOSIS — N186 End stage renal disease: Secondary | ICD-10-CM | POA: Diagnosis not present

## 2021-12-13 DIAGNOSIS — Z992 Dependence on renal dialysis: Secondary | ICD-10-CM | POA: Diagnosis not present

## 2021-12-18 DIAGNOSIS — R52 Pain, unspecified: Secondary | ICD-10-CM | POA: Diagnosis not present

## 2021-12-18 DIAGNOSIS — N2581 Secondary hyperparathyroidism of renal origin: Secondary | ICD-10-CM | POA: Diagnosis not present

## 2021-12-18 DIAGNOSIS — N186 End stage renal disease: Secondary | ICD-10-CM | POA: Diagnosis not present

## 2021-12-18 DIAGNOSIS — D509 Iron deficiency anemia, unspecified: Secondary | ICD-10-CM | POA: Diagnosis not present

## 2021-12-18 DIAGNOSIS — D688 Other specified coagulation defects: Secondary | ICD-10-CM | POA: Diagnosis not present

## 2021-12-18 DIAGNOSIS — D631 Anemia in chronic kidney disease: Secondary | ICD-10-CM | POA: Diagnosis not present

## 2021-12-18 DIAGNOSIS — Z992 Dependence on renal dialysis: Secondary | ICD-10-CM | POA: Diagnosis not present

## 2021-12-20 DIAGNOSIS — N2581 Secondary hyperparathyroidism of renal origin: Secondary | ICD-10-CM | POA: Diagnosis not present

## 2021-12-20 DIAGNOSIS — D631 Anemia in chronic kidney disease: Secondary | ICD-10-CM | POA: Diagnosis not present

## 2021-12-20 DIAGNOSIS — R52 Pain, unspecified: Secondary | ICD-10-CM | POA: Diagnosis not present

## 2021-12-20 DIAGNOSIS — D509 Iron deficiency anemia, unspecified: Secondary | ICD-10-CM | POA: Diagnosis not present

## 2021-12-20 DIAGNOSIS — N186 End stage renal disease: Secondary | ICD-10-CM | POA: Diagnosis not present

## 2021-12-20 DIAGNOSIS — D688 Other specified coagulation defects: Secondary | ICD-10-CM | POA: Diagnosis not present

## 2021-12-20 DIAGNOSIS — Z992 Dependence on renal dialysis: Secondary | ICD-10-CM | POA: Diagnosis not present

## 2021-12-23 DIAGNOSIS — D688 Other specified coagulation defects: Secondary | ICD-10-CM | POA: Diagnosis not present

## 2021-12-23 DIAGNOSIS — N186 End stage renal disease: Secondary | ICD-10-CM | POA: Diagnosis not present

## 2021-12-23 DIAGNOSIS — D631 Anemia in chronic kidney disease: Secondary | ICD-10-CM | POA: Diagnosis not present

## 2021-12-23 DIAGNOSIS — Z992 Dependence on renal dialysis: Secondary | ICD-10-CM | POA: Diagnosis not present

## 2021-12-23 DIAGNOSIS — R52 Pain, unspecified: Secondary | ICD-10-CM | POA: Diagnosis not present

## 2021-12-23 DIAGNOSIS — D509 Iron deficiency anemia, unspecified: Secondary | ICD-10-CM | POA: Diagnosis not present

## 2021-12-23 DIAGNOSIS — N2581 Secondary hyperparathyroidism of renal origin: Secondary | ICD-10-CM | POA: Diagnosis not present

## 2021-12-25 ENCOUNTER — Encounter: Payer: Medicare Other | Admitting: Internal Medicine

## 2021-12-25 DIAGNOSIS — R52 Pain, unspecified: Secondary | ICD-10-CM | POA: Diagnosis not present

## 2021-12-25 DIAGNOSIS — D688 Other specified coagulation defects: Secondary | ICD-10-CM | POA: Diagnosis not present

## 2021-12-25 DIAGNOSIS — Z992 Dependence on renal dialysis: Secondary | ICD-10-CM | POA: Diagnosis not present

## 2021-12-25 DIAGNOSIS — N186 End stage renal disease: Secondary | ICD-10-CM | POA: Diagnosis not present

## 2021-12-25 DIAGNOSIS — D631 Anemia in chronic kidney disease: Secondary | ICD-10-CM | POA: Diagnosis not present

## 2021-12-25 DIAGNOSIS — N2581 Secondary hyperparathyroidism of renal origin: Secondary | ICD-10-CM | POA: Diagnosis not present

## 2021-12-25 DIAGNOSIS — D509 Iron deficiency anemia, unspecified: Secondary | ICD-10-CM | POA: Diagnosis not present

## 2021-12-26 ENCOUNTER — Encounter: Payer: Self-pay | Admitting: Internal Medicine

## 2021-12-26 ENCOUNTER — Ambulatory Visit (INDEPENDENT_AMBULATORY_CARE_PROVIDER_SITE_OTHER): Payer: Medicare Other | Admitting: Internal Medicine

## 2021-12-26 VITALS — BP 136/82 | HR 66 | Temp 98.2°F | Ht 69.0 in | Wt 175.0 lb

## 2021-12-26 DIAGNOSIS — Z23 Encounter for immunization: Secondary | ICD-10-CM | POA: Insufficient documentation

## 2021-12-26 DIAGNOSIS — I10 Essential (primary) hypertension: Secondary | ICD-10-CM

## 2021-12-26 DIAGNOSIS — E785 Hyperlipidemia, unspecified: Secondary | ICD-10-CM

## 2021-12-26 DIAGNOSIS — E118 Type 2 diabetes mellitus with unspecified complications: Secondary | ICD-10-CM | POA: Diagnosis not present

## 2021-12-26 DIAGNOSIS — I739 Peripheral vascular disease, unspecified: Secondary | ICD-10-CM | POA: Diagnosis not present

## 2021-12-26 DIAGNOSIS — N5201 Erectile dysfunction due to arterial insufficiency: Secondary | ICD-10-CM | POA: Diagnosis not present

## 2021-12-26 HISTORY — DX: Encounter for immunization: Z23

## 2021-12-26 MED ORDER — BOOSTRIX 5-2.5-18.5 LF-MCG/0.5 IM SUSP: 0.5000 mL | Freq: Once | INTRAMUSCULAR | 0 refills | Status: AC

## 2021-12-26 MED ORDER — SHINGRIX 50 MCG/0.5ML IM SUSR
0.5000 mL | Freq: Once | INTRAMUSCULAR | 1 refills | Status: AC
Start: 2021-12-26 — End: 2021-12-26

## 2021-12-26 MED ORDER — SILDENAFIL CITRATE 20 MG PO TABS: 20.0000 mg | ORAL_TABLET | Freq: Every day | ORAL | 5 refills | Status: DC

## 2021-12-26 NOTE — Progress Notes (Signed)
Subjective:  Patient ID: [name], male    DOB: June 03, 1951  Age: 71 y.o. MRN: [id number]  CC: Hypertension   HPI [name] presents for f/up -  He tells me that last November he had a cardiac work-up to see if he is a candidate for renal transplantation.  He is receiving hemodialysis 3 times a week.  He requests a medication for erectile dysfunction.  Outpatient Medications Prior to Visit  Medication Sig Dispense Refill   ACCU-CHEK SOFTCLIX LANCETS lancets 1 each by Other route 2 (two) times daily. Use to check blood sugars twice a day 100 each 3   acetaminophen (TYLENOL) 500 MG tablet Take 1,000 mg by mouth every 8 (eight) hours as needed for moderate pain.     amLODipine (NORVASC) 5 MG tablet Take 5 mg by mouth daily.     Blood Glucose Monitoring Suppl (ACCU-CHEK AVIVA PLUS) w/Device KIT Use as directed to check blood sugars 1 kit 0   carvedilol (COREG) 25 MG tablet Take 25 mg by mouth 2 (two) times daily with a meal.     cinacalcet (SENSIPAR) 60 MG tablet Take 60 mg by mouth daily.     glucose blood test strip Use TID 100 each 12   losartan (COZAAR) 25 MG tablet Take 25 mg by mouth in the morning and at bedtime.     sevelamer carbonate (RENVELA) 800 MG tablet Take 1,600 mg by mouth 3 (three) times daily with meals.     oxyCODONE (ROXICODONE) 5 MG immediate release tablet Take 1 tablet (5 mg total) by mouth every 6 (six) hours as needed for severe pain. 20 tablet 0   No facility-administered medications prior to visit.    ROS Review of Systems  Constitutional:  Negative for diaphoresis, fatigue and unexpected weight change.  HENT: Negative.    Eyes: Negative.   Respiratory:  Negative for cough, chest tightness, shortness of breath and wheezing.   Cardiovascular:  Negative for chest pain, palpitations and leg swelling.  Gastrointestinal:  Negative for abdominal pain, constipation, diarrhea and vomiting.  Endocrine: Negative.   Genitourinary: Negative.  Negative  for difficulty urinating, dysuria and hematuria.       +ED  Musculoskeletal: Negative.  Negative for arthralgias and myalgias.  Skin: Negative.   Neurological: Negative.  Negative for dizziness, weakness and headaches.  Hematological:  Negative for adenopathy. Does not bruise/bleed easily.  Psychiatric/Behavioral: Negative.     Objective:  BP 136/82 (BP Location: Left Arm, Patient Position: Sitting, Cuff Size: Large)   Pulse 66   Temp 98.2 F (36.8 C) (Oral)   Ht '5\' 9"'  (1.753 m)   Wt 175 lb (79.4 kg)   SpO2 98%   BMI 25.84 kg/m   BP Readings from Last 3 Encounters:  12/26/21 136/82  07/18/21 116/78  07/02/21 (!) 154/79    Wt Readings from Last 3 Encounters:  12/26/21 175 lb (79.4 kg)  07/18/21 178 lb (80.7 kg)  07/02/21 178 lb (80.7 kg)    Physical Exam Vitals reviewed.  HENT:     Nose: Nose normal.     Mouth/Throat:     Mouth: Mucous membranes are moist.  Eyes:     General: No scleral icterus.    Conjunctiva/sclera: Conjunctivae normal.  Cardiovascular:     Rate and Rhythm: Normal rate and regular rhythm.     Heart sounds: No murmur heard. Pulmonary:     Effort: Pulmonary effort is normal.     Breath sounds: No stridor.  No wheezing, rhonchi or rales.  Abdominal:     General: Abdomen is flat.     Palpations: There is no mass.     Tenderness: There is no abdominal tenderness. There is no guarding.     Hernia: No hernia is present.  Musculoskeletal:        General: Normal range of motion.     Cervical back: Neck supple.     Right lower leg: No edema.     Left lower leg: No edema.  Lymphadenopathy:     Cervical: No cervical adenopathy.  Skin:    General: Skin is warm and dry.  Neurological:     General: No focal deficit present.     Mental Status: He is alert.    Lab Results  Component Value Date   WBC 4.3 03/12/2021   HGB 13.6 07/18/2021   HCT 40.0 07/18/2021   PLT 151.0 03/12/2021   GLUCOSE 87 07/18/2021   CHOL 148 03/07/2020   TRIG 81  03/07/2020   HDL 51 03/07/2020   LDLDIRECT 75.0 05/18/2017   LDLCALC 92 03/07/2020   ALT 10 04/03/2020   AST 10 (A) 04/03/2020   NA 137 07/18/2021   K 4.6 07/18/2021   CL 95 (L) 07/18/2021   CREATININE 9.60 (H) 07/18/2021   BUN 31 (H) 07/18/2021   CO2 21 (L) 09/15/2020   TSH 1.99 03/12/2021   PSA 1.07 03/12/2021   INR 1.08 06/28/2014   HGBA1C 4.7 11/19/2021    No results found.  Assessment & Plan:   [name] was seen today for hypertension.  Diagnoses and all orders for this visit:  Dyslipidemia, goal LDL below 100- I recommended simvastatin and ezetimibe for cardiovascular risk reduction. -     Cancel: Lipid panel; Future -     ezetimibe-simvastatin (VYTORIN) 10-10 MG tablet; Take 1 tablet by mouth at bedtime.  Need for prophylactic vaccination with combined diphtheria-tetanus-pertussis (DTP) vaccine -     Tdap (BOOSTRIX) 5-2.5-18.5 LF-MCG/0.5 injection; Inject 0.5 mLs into the muscle once for 1 dose.  Need for prophylactic vaccination and inoculation against varicella -     Zoster Vaccine Adjuvanted [facility]) injection; Inject 0.5 mLs into the muscle once for 1 dose.  Erectile dysfunction due to arterial insufficiency -     sildenafil (REVATIO) 20 MG tablet; Take 1 tablet (20 mg total) by mouth daily.  Primary hypertension- His blood pressure is well controlled.  PAD (peripheral artery disease) (HCC) -     ezetimibe-simvastatin (VYTORIN) 10-10 MG tablet; Take 1 tablet by mouth at bedtime.  Type 2 diabetes mellitus with complication, without long-term current use of insulin ([place])- His recent A1c shows his blood sugar is very well controlled. -     HM Diabetes Foot Exam   I have discontinued [name]'s oxyCODONE. I am also having him start on Boostrix, Shingrix, sildenafil, and ezetimibe-simvastatin. Additionally, I am having him maintain his carvedilol, amLODipine, acetaminophen, glucose blood, Accu-Chek Aviva Plus, Accu-Chek Softclix Lancets, cinacalcet,  losartan, and sevelamer carbonate.  Meds ordered this encounter  Medications   Tdap (BOOSTRIX) 5-2.5-18.5 LF-MCG/0.5 injection    Sig: Inject 0.5 mLs into the muscle once for 1 dose.    Dispense:  0.5 mL    Refill:  0   Zoster Vaccine Adjuvanted [facility]) injection    Sig: Inject 0.5 mLs into the muscle once for 1 dose.    Dispense:  0.5 mL    Refill:  1   sildenafil (REVATIO) 20 MG tablet  Sig: Take 1 tablet (20 mg total) by mouth daily.    Dispense:  10 tablet    Refill:  5   ezetimibe-simvastatin (VYTORIN) 10-10 MG tablet    Sig: Take 1 tablet by mouth at bedtime.    Dispense:  90 tablet    Refill:  1     Follow-up: No follow-ups on file.  Scarlette Calico, MD

## 2021-12-27 ENCOUNTER — Telehealth: Payer: Self-pay

## 2021-12-27 DIAGNOSIS — N2581 Secondary hyperparathyroidism of renal origin: Secondary | ICD-10-CM | POA: Diagnosis not present

## 2021-12-27 DIAGNOSIS — N186 End stage renal disease: Secondary | ICD-10-CM | POA: Diagnosis not present

## 2021-12-27 DIAGNOSIS — D688 Other specified coagulation defects: Secondary | ICD-10-CM | POA: Diagnosis not present

## 2021-12-27 DIAGNOSIS — R52 Pain, unspecified: Secondary | ICD-10-CM | POA: Diagnosis not present

## 2021-12-27 DIAGNOSIS — D509 Iron deficiency anemia, unspecified: Secondary | ICD-10-CM | POA: Diagnosis not present

## 2021-12-27 DIAGNOSIS — Z992 Dependence on renal dialysis: Secondary | ICD-10-CM | POA: Diagnosis not present

## 2021-12-27 DIAGNOSIS — D631 Anemia in chronic kidney disease: Secondary | ICD-10-CM | POA: Diagnosis not present

## 2021-12-27 MED ORDER — EZETIMIBE-SIMVASTATIN 10-10 MG PO TABS: 1.0000 | ORAL_TABLET | Freq: Every day | ORAL | 1 refills | Status: DC

## 2021-12-27 NOTE — Telephone Encounter (Signed)
Per CoverMyMeds: PA was denied.  Reason given: Drugs when used for the treatment of sexual dysfunction are excluded from coverage under Medicare rules

## 2021-12-27 NOTE — Telephone Encounter (Signed)
Key: [id number]

## 2021-12-30 DIAGNOSIS — R52 Pain, unspecified: Secondary | ICD-10-CM | POA: Diagnosis not present

## 2021-12-30 DIAGNOSIS — Z992 Dependence on renal dialysis: Secondary | ICD-10-CM | POA: Diagnosis not present

## 2021-12-30 DIAGNOSIS — N186 End stage renal disease: Secondary | ICD-10-CM | POA: Diagnosis not present

## 2021-12-30 DIAGNOSIS — D631 Anemia in chronic kidney disease: Secondary | ICD-10-CM | POA: Diagnosis not present

## 2021-12-30 DIAGNOSIS — N2581 Secondary hyperparathyroidism of renal origin: Secondary | ICD-10-CM | POA: Diagnosis not present

## 2021-12-30 DIAGNOSIS — D688 Other specified coagulation defects: Secondary | ICD-10-CM | POA: Diagnosis not present

## 2021-12-30 DIAGNOSIS — D509 Iron deficiency anemia, unspecified: Secondary | ICD-10-CM | POA: Diagnosis not present

## 2022-01-01 DIAGNOSIS — D631 Anemia in chronic kidney disease: Secondary | ICD-10-CM | POA: Diagnosis not present

## 2022-01-01 DIAGNOSIS — D688 Other specified coagulation defects: Secondary | ICD-10-CM | POA: Diagnosis not present

## 2022-01-01 DIAGNOSIS — R52 Pain, unspecified: Secondary | ICD-10-CM | POA: Diagnosis not present

## 2022-01-01 DIAGNOSIS — D509 Iron deficiency anemia, unspecified: Secondary | ICD-10-CM | POA: Diagnosis not present

## 2022-01-01 DIAGNOSIS — N2581 Secondary hyperparathyroidism of renal origin: Secondary | ICD-10-CM | POA: Diagnosis not present

## 2022-01-01 DIAGNOSIS — N186 End stage renal disease: Secondary | ICD-10-CM | POA: Diagnosis not present

## 2022-01-01 DIAGNOSIS — Z992 Dependence on renal dialysis: Secondary | ICD-10-CM | POA: Diagnosis not present

## 2022-01-03 DIAGNOSIS — D688 Other specified coagulation defects: Secondary | ICD-10-CM | POA: Diagnosis not present

## 2022-01-03 DIAGNOSIS — D631 Anemia in chronic kidney disease: Secondary | ICD-10-CM | POA: Diagnosis not present

## 2022-01-03 DIAGNOSIS — Z992 Dependence on renal dialysis: Secondary | ICD-10-CM | POA: Diagnosis not present

## 2022-01-03 DIAGNOSIS — R52 Pain, unspecified: Secondary | ICD-10-CM | POA: Diagnosis not present

## 2022-01-03 DIAGNOSIS — D509 Iron deficiency anemia, unspecified: Secondary | ICD-10-CM | POA: Diagnosis not present

## 2022-01-03 DIAGNOSIS — N2581 Secondary hyperparathyroidism of renal origin: Secondary | ICD-10-CM | POA: Diagnosis not present

## 2022-01-03 DIAGNOSIS — N186 End stage renal disease: Secondary | ICD-10-CM | POA: Diagnosis not present

## 2022-01-06 DIAGNOSIS — Z992 Dependence on renal dialysis: Secondary | ICD-10-CM | POA: Diagnosis not present

## 2022-01-06 DIAGNOSIS — R52 Pain, unspecified: Secondary | ICD-10-CM | POA: Diagnosis not present

## 2022-01-06 DIAGNOSIS — D688 Other specified coagulation defects: Secondary | ICD-10-CM | POA: Diagnosis not present

## 2022-01-06 DIAGNOSIS — D631 Anemia in chronic kidney disease: Secondary | ICD-10-CM | POA: Diagnosis not present

## 2022-01-06 DIAGNOSIS — D509 Iron deficiency anemia, unspecified: Secondary | ICD-10-CM | POA: Diagnosis not present

## 2022-01-06 DIAGNOSIS — N2581 Secondary hyperparathyroidism of renal origin: Secondary | ICD-10-CM | POA: Diagnosis not present

## 2022-01-06 DIAGNOSIS — N186 End stage renal disease: Secondary | ICD-10-CM | POA: Diagnosis not present

## 2022-01-08 DIAGNOSIS — D509 Iron deficiency anemia, unspecified: Secondary | ICD-10-CM | POA: Diagnosis not present

## 2022-01-08 DIAGNOSIS — D688 Other specified coagulation defects: Secondary | ICD-10-CM | POA: Diagnosis not present

## 2022-01-08 DIAGNOSIS — N2581 Secondary hyperparathyroidism of renal origin: Secondary | ICD-10-CM | POA: Diagnosis not present

## 2022-01-08 DIAGNOSIS — D631 Anemia in chronic kidney disease: Secondary | ICD-10-CM | POA: Diagnosis not present

## 2022-01-08 DIAGNOSIS — T8610 Unspecified complication of kidney transplant: Secondary | ICD-10-CM | POA: Diagnosis not present

## 2022-01-08 DIAGNOSIS — N186 End stage renal disease: Secondary | ICD-10-CM | POA: Diagnosis not present

## 2022-01-08 DIAGNOSIS — R52 Pain, unspecified: Secondary | ICD-10-CM | POA: Diagnosis not present

## 2022-01-08 DIAGNOSIS — Z992 Dependence on renal dialysis: Secondary | ICD-10-CM | POA: Diagnosis not present

## 2022-01-10 DIAGNOSIS — N186 End stage renal disease: Secondary | ICD-10-CM | POA: Diagnosis not present

## 2022-01-10 DIAGNOSIS — N2581 Secondary hyperparathyroidism of renal origin: Secondary | ICD-10-CM | POA: Diagnosis not present

## 2022-01-10 DIAGNOSIS — D688 Other specified coagulation defects: Secondary | ICD-10-CM | POA: Diagnosis not present

## 2022-01-10 DIAGNOSIS — D631 Anemia in chronic kidney disease: Secondary | ICD-10-CM | POA: Diagnosis not present

## 2022-01-10 DIAGNOSIS — Z992 Dependence on renal dialysis: Secondary | ICD-10-CM | POA: Diagnosis not present

## 2022-01-10 DIAGNOSIS — D509 Iron deficiency anemia, unspecified: Secondary | ICD-10-CM | POA: Diagnosis not present

## 2022-01-13 DIAGNOSIS — Z992 Dependence on renal dialysis: Secondary | ICD-10-CM | POA: Diagnosis not present

## 2022-01-13 DIAGNOSIS — N2581 Secondary hyperparathyroidism of renal origin: Secondary | ICD-10-CM | POA: Diagnosis not present

## 2022-01-13 DIAGNOSIS — D688 Other specified coagulation defects: Secondary | ICD-10-CM | POA: Diagnosis not present

## 2022-01-13 DIAGNOSIS — D509 Iron deficiency anemia, unspecified: Secondary | ICD-10-CM | POA: Diagnosis not present

## 2022-01-13 DIAGNOSIS — D631 Anemia in chronic kidney disease: Secondary | ICD-10-CM | POA: Diagnosis not present

## 2022-01-13 DIAGNOSIS — N186 End stage renal disease: Secondary | ICD-10-CM | POA: Diagnosis not present

## 2022-01-15 DIAGNOSIS — Z992 Dependence on renal dialysis: Secondary | ICD-10-CM | POA: Diagnosis not present

## 2022-01-15 DIAGNOSIS — N2581 Secondary hyperparathyroidism of renal origin: Secondary | ICD-10-CM | POA: Diagnosis not present

## 2022-01-15 DIAGNOSIS — D509 Iron deficiency anemia, unspecified: Secondary | ICD-10-CM | POA: Diagnosis not present

## 2022-01-15 DIAGNOSIS — D688 Other specified coagulation defects: Secondary | ICD-10-CM | POA: Diagnosis not present

## 2022-01-15 DIAGNOSIS — D631 Anemia in chronic kidney disease: Secondary | ICD-10-CM | POA: Diagnosis not present

## 2022-01-15 DIAGNOSIS — N186 End stage renal disease: Secondary | ICD-10-CM | POA: Diagnosis not present

## 2022-01-17 DIAGNOSIS — D631 Anemia in chronic kidney disease: Secondary | ICD-10-CM | POA: Diagnosis not present

## 2022-01-17 DIAGNOSIS — Z992 Dependence on renal dialysis: Secondary | ICD-10-CM | POA: Diagnosis not present

## 2022-01-17 DIAGNOSIS — N186 End stage renal disease: Secondary | ICD-10-CM | POA: Diagnosis not present

## 2022-01-17 DIAGNOSIS — N2581 Secondary hyperparathyroidism of renal origin: Secondary | ICD-10-CM | POA: Diagnosis not present

## 2022-01-17 DIAGNOSIS — D509 Iron deficiency anemia, unspecified: Secondary | ICD-10-CM | POA: Diagnosis not present

## 2022-01-17 DIAGNOSIS — D688 Other specified coagulation defects: Secondary | ICD-10-CM | POA: Diagnosis not present

## 2022-01-22 DIAGNOSIS — N2581 Secondary hyperparathyroidism of renal origin: Secondary | ICD-10-CM | POA: Diagnosis not present

## 2022-01-22 DIAGNOSIS — D631 Anemia in chronic kidney disease: Secondary | ICD-10-CM | POA: Diagnosis not present

## 2022-01-22 DIAGNOSIS — N186 End stage renal disease: Secondary | ICD-10-CM | POA: Diagnosis not present

## 2022-01-22 DIAGNOSIS — D688 Other specified coagulation defects: Secondary | ICD-10-CM | POA: Diagnosis not present

## 2022-01-22 DIAGNOSIS — Z992 Dependence on renal dialysis: Secondary | ICD-10-CM | POA: Diagnosis not present

## 2022-01-22 DIAGNOSIS — D509 Iron deficiency anemia, unspecified: Secondary | ICD-10-CM | POA: Diagnosis not present

## 2022-01-24 DIAGNOSIS — D631 Anemia in chronic kidney disease: Secondary | ICD-10-CM | POA: Diagnosis not present

## 2022-01-24 DIAGNOSIS — D509 Iron deficiency anemia, unspecified: Secondary | ICD-10-CM | POA: Diagnosis not present

## 2022-01-24 DIAGNOSIS — N2581 Secondary hyperparathyroidism of renal origin: Secondary | ICD-10-CM | POA: Diagnosis not present

## 2022-01-24 DIAGNOSIS — D688 Other specified coagulation defects: Secondary | ICD-10-CM | POA: Diagnosis not present

## 2022-01-24 DIAGNOSIS — Z992 Dependence on renal dialysis: Secondary | ICD-10-CM | POA: Diagnosis not present

## 2022-01-24 DIAGNOSIS — N186 End stage renal disease: Secondary | ICD-10-CM | POA: Diagnosis not present

## 2022-01-27 DIAGNOSIS — D509 Iron deficiency anemia, unspecified: Secondary | ICD-10-CM | POA: Diagnosis not present

## 2022-01-27 DIAGNOSIS — D688 Other specified coagulation defects: Secondary | ICD-10-CM | POA: Diagnosis not present

## 2022-01-27 DIAGNOSIS — Z992 Dependence on renal dialysis: Secondary | ICD-10-CM | POA: Diagnosis not present

## 2022-01-27 DIAGNOSIS — D631 Anemia in chronic kidney disease: Secondary | ICD-10-CM | POA: Diagnosis not present

## 2022-01-27 DIAGNOSIS — N186 End stage renal disease: Secondary | ICD-10-CM | POA: Diagnosis not present

## 2022-01-27 DIAGNOSIS — N2581 Secondary hyperparathyroidism of renal origin: Secondary | ICD-10-CM | POA: Diagnosis not present

## 2022-01-29 DIAGNOSIS — D631 Anemia in chronic kidney disease: Secondary | ICD-10-CM | POA: Diagnosis not present

## 2022-01-29 DIAGNOSIS — N2581 Secondary hyperparathyroidism of renal origin: Secondary | ICD-10-CM | POA: Diagnosis not present

## 2022-01-29 DIAGNOSIS — N186 End stage renal disease: Secondary | ICD-10-CM | POA: Diagnosis not present

## 2022-01-29 DIAGNOSIS — D509 Iron deficiency anemia, unspecified: Secondary | ICD-10-CM | POA: Diagnosis not present

## 2022-01-29 DIAGNOSIS — D688 Other specified coagulation defects: Secondary | ICD-10-CM | POA: Diagnosis not present

## 2022-01-29 DIAGNOSIS — Z992 Dependence on renal dialysis: Secondary | ICD-10-CM | POA: Diagnosis not present

## 2022-01-31 DIAGNOSIS — N2581 Secondary hyperparathyroidism of renal origin: Secondary | ICD-10-CM | POA: Diagnosis not present

## 2022-01-31 DIAGNOSIS — D688 Other specified coagulation defects: Secondary | ICD-10-CM | POA: Diagnosis not present

## 2022-01-31 DIAGNOSIS — D509 Iron deficiency anemia, unspecified: Secondary | ICD-10-CM | POA: Diagnosis not present

## 2022-01-31 DIAGNOSIS — Z992 Dependence on renal dialysis: Secondary | ICD-10-CM | POA: Diagnosis not present

## 2022-01-31 DIAGNOSIS — N186 End stage renal disease: Secondary | ICD-10-CM | POA: Diagnosis not present

## 2022-01-31 DIAGNOSIS — D631 Anemia in chronic kidney disease: Secondary | ICD-10-CM | POA: Diagnosis not present

## 2022-02-03 DIAGNOSIS — D688 Other specified coagulation defects: Secondary | ICD-10-CM | POA: Diagnosis not present

## 2022-02-03 DIAGNOSIS — D509 Iron deficiency anemia, unspecified: Secondary | ICD-10-CM | POA: Diagnosis not present

## 2022-02-03 DIAGNOSIS — N2581 Secondary hyperparathyroidism of renal origin: Secondary | ICD-10-CM | POA: Diagnosis not present

## 2022-02-03 DIAGNOSIS — Z992 Dependence on renal dialysis: Secondary | ICD-10-CM | POA: Diagnosis not present

## 2022-02-03 DIAGNOSIS — D631 Anemia in chronic kidney disease: Secondary | ICD-10-CM | POA: Diagnosis not present

## 2022-02-03 DIAGNOSIS — N186 End stage renal disease: Secondary | ICD-10-CM | POA: Diagnosis not present

## 2022-02-05 DIAGNOSIS — D688 Other specified coagulation defects: Secondary | ICD-10-CM | POA: Diagnosis not present

## 2022-02-05 DIAGNOSIS — D631 Anemia in chronic kidney disease: Secondary | ICD-10-CM | POA: Diagnosis not present

## 2022-02-05 DIAGNOSIS — Z992 Dependence on renal dialysis: Secondary | ICD-10-CM | POA: Diagnosis not present

## 2022-02-05 DIAGNOSIS — D509 Iron deficiency anemia, unspecified: Secondary | ICD-10-CM | POA: Diagnosis not present

## 2022-02-05 DIAGNOSIS — N186 End stage renal disease: Secondary | ICD-10-CM | POA: Diagnosis not present

## 2022-02-05 DIAGNOSIS — N2581 Secondary hyperparathyroidism of renal origin: Secondary | ICD-10-CM | POA: Diagnosis not present

## 2022-02-07 DIAGNOSIS — D631 Anemia in chronic kidney disease: Secondary | ICD-10-CM | POA: Diagnosis not present

## 2022-02-07 DIAGNOSIS — D688 Other specified coagulation defects: Secondary | ICD-10-CM | POA: Diagnosis not present

## 2022-02-07 DIAGNOSIS — N186 End stage renal disease: Secondary | ICD-10-CM | POA: Diagnosis not present

## 2022-02-07 DIAGNOSIS — T8610 Unspecified complication of kidney transplant: Secondary | ICD-10-CM | POA: Diagnosis not present

## 2022-02-07 DIAGNOSIS — Z992 Dependence on renal dialysis: Secondary | ICD-10-CM | POA: Diagnosis not present

## 2022-02-07 DIAGNOSIS — N2581 Secondary hyperparathyroidism of renal origin: Secondary | ICD-10-CM | POA: Diagnosis not present

## 2022-02-07 DIAGNOSIS — D509 Iron deficiency anemia, unspecified: Secondary | ICD-10-CM | POA: Diagnosis not present

## 2022-02-10 DIAGNOSIS — N2581 Secondary hyperparathyroidism of renal origin: Secondary | ICD-10-CM | POA: Diagnosis not present

## 2022-02-10 DIAGNOSIS — Z992 Dependence on renal dialysis: Secondary | ICD-10-CM | POA: Diagnosis not present

## 2022-02-10 DIAGNOSIS — D688 Other specified coagulation defects: Secondary | ICD-10-CM | POA: Diagnosis not present

## 2022-02-10 DIAGNOSIS — N186 End stage renal disease: Secondary | ICD-10-CM | POA: Diagnosis not present

## 2022-02-10 DIAGNOSIS — D509 Iron deficiency anemia, unspecified: Secondary | ICD-10-CM | POA: Diagnosis not present

## 2022-02-12 DIAGNOSIS — D509 Iron deficiency anemia, unspecified: Secondary | ICD-10-CM | POA: Diagnosis not present

## 2022-02-12 DIAGNOSIS — Z992 Dependence on renal dialysis: Secondary | ICD-10-CM | POA: Diagnosis not present

## 2022-02-12 DIAGNOSIS — N186 End stage renal disease: Secondary | ICD-10-CM | POA: Diagnosis not present

## 2022-02-12 DIAGNOSIS — D688 Other specified coagulation defects: Secondary | ICD-10-CM | POA: Diagnosis not present

## 2022-02-12 DIAGNOSIS — N2581 Secondary hyperparathyroidism of renal origin: Secondary | ICD-10-CM | POA: Diagnosis not present

## 2022-02-14 DIAGNOSIS — Z992 Dependence on renal dialysis: Secondary | ICD-10-CM | POA: Diagnosis not present

## 2022-02-14 DIAGNOSIS — N186 End stage renal disease: Secondary | ICD-10-CM | POA: Diagnosis not present

## 2022-02-14 DIAGNOSIS — D509 Iron deficiency anemia, unspecified: Secondary | ICD-10-CM | POA: Diagnosis not present

## 2022-02-14 DIAGNOSIS — N2581 Secondary hyperparathyroidism of renal origin: Secondary | ICD-10-CM | POA: Diagnosis not present

## 2022-02-14 DIAGNOSIS — D688 Other specified coagulation defects: Secondary | ICD-10-CM | POA: Diagnosis not present

## 2022-02-17 DIAGNOSIS — D509 Iron deficiency anemia, unspecified: Secondary | ICD-10-CM | POA: Diagnosis not present

## 2022-02-17 DIAGNOSIS — N186 End stage renal disease: Secondary | ICD-10-CM | POA: Diagnosis not present

## 2022-02-17 DIAGNOSIS — Z992 Dependence on renal dialysis: Secondary | ICD-10-CM | POA: Diagnosis not present

## 2022-02-17 DIAGNOSIS — D688 Other specified coagulation defects: Secondary | ICD-10-CM | POA: Diagnosis not present

## 2022-02-17 DIAGNOSIS — N2581 Secondary hyperparathyroidism of renal origin: Secondary | ICD-10-CM | POA: Diagnosis not present

## 2022-02-19 DIAGNOSIS — N186 End stage renal disease: Secondary | ICD-10-CM | POA: Diagnosis not present

## 2022-02-19 DIAGNOSIS — N2581 Secondary hyperparathyroidism of renal origin: Secondary | ICD-10-CM | POA: Diagnosis not present

## 2022-02-19 DIAGNOSIS — D688 Other specified coagulation defects: Secondary | ICD-10-CM | POA: Diagnosis not present

## 2022-02-19 DIAGNOSIS — D509 Iron deficiency anemia, unspecified: Secondary | ICD-10-CM | POA: Diagnosis not present

## 2022-02-19 DIAGNOSIS — Z992 Dependence on renal dialysis: Secondary | ICD-10-CM | POA: Diagnosis not present

## 2022-02-21 DIAGNOSIS — N186 End stage renal disease: Secondary | ICD-10-CM | POA: Diagnosis not present

## 2022-02-21 DIAGNOSIS — Z992 Dependence on renal dialysis: Secondary | ICD-10-CM | POA: Diagnosis not present

## 2022-02-21 DIAGNOSIS — D688 Other specified coagulation defects: Secondary | ICD-10-CM | POA: Diagnosis not present

## 2022-02-21 DIAGNOSIS — N2581 Secondary hyperparathyroidism of renal origin: Secondary | ICD-10-CM | POA: Diagnosis not present

## 2022-02-21 DIAGNOSIS — D509 Iron deficiency anemia, unspecified: Secondary | ICD-10-CM | POA: Diagnosis not present

## 2022-02-24 DIAGNOSIS — N2581 Secondary hyperparathyroidism of renal origin: Secondary | ICD-10-CM | POA: Diagnosis not present

## 2022-02-24 DIAGNOSIS — D509 Iron deficiency anemia, unspecified: Secondary | ICD-10-CM | POA: Diagnosis not present

## 2022-02-24 DIAGNOSIS — N186 End stage renal disease: Secondary | ICD-10-CM | POA: Diagnosis not present

## 2022-02-24 DIAGNOSIS — Z992 Dependence on renal dialysis: Secondary | ICD-10-CM | POA: Diagnosis not present

## 2022-02-24 DIAGNOSIS — D688 Other specified coagulation defects: Secondary | ICD-10-CM | POA: Diagnosis not present

## 2022-02-26 DIAGNOSIS — D688 Other specified coagulation defects: Secondary | ICD-10-CM | POA: Diagnosis not present

## 2022-02-26 DIAGNOSIS — Z992 Dependence on renal dialysis: Secondary | ICD-10-CM | POA: Diagnosis not present

## 2022-02-26 DIAGNOSIS — D509 Iron deficiency anemia, unspecified: Secondary | ICD-10-CM | POA: Diagnosis not present

## 2022-02-26 DIAGNOSIS — N2581 Secondary hyperparathyroidism of renal origin: Secondary | ICD-10-CM | POA: Diagnosis not present

## 2022-02-26 DIAGNOSIS — N186 End stage renal disease: Secondary | ICD-10-CM | POA: Diagnosis not present

## 2022-02-28 DIAGNOSIS — D509 Iron deficiency anemia, unspecified: Secondary | ICD-10-CM | POA: Diagnosis not present

## 2022-02-28 DIAGNOSIS — D688 Other specified coagulation defects: Secondary | ICD-10-CM | POA: Diagnosis not present

## 2022-02-28 DIAGNOSIS — Z992 Dependence on renal dialysis: Secondary | ICD-10-CM | POA: Diagnosis not present

## 2022-02-28 DIAGNOSIS — N2581 Secondary hyperparathyroidism of renal origin: Secondary | ICD-10-CM | POA: Diagnosis not present

## 2022-02-28 DIAGNOSIS — N186 End stage renal disease: Secondary | ICD-10-CM | POA: Diagnosis not present

## 2022-03-05 DIAGNOSIS — D688 Other specified coagulation defects: Secondary | ICD-10-CM | POA: Diagnosis not present

## 2022-03-05 DIAGNOSIS — N186 End stage renal disease: Secondary | ICD-10-CM | POA: Diagnosis not present

## 2022-03-05 DIAGNOSIS — N2581 Secondary hyperparathyroidism of renal origin: Secondary | ICD-10-CM | POA: Diagnosis not present

## 2022-03-05 DIAGNOSIS — D509 Iron deficiency anemia, unspecified: Secondary | ICD-10-CM | POA: Diagnosis not present

## 2022-03-05 DIAGNOSIS — Z992 Dependence on renal dialysis: Secondary | ICD-10-CM | POA: Diagnosis not present

## 2022-03-07 DIAGNOSIS — N2581 Secondary hyperparathyroidism of renal origin: Secondary | ICD-10-CM | POA: Diagnosis not present

## 2022-03-07 DIAGNOSIS — Z992 Dependence on renal dialysis: Secondary | ICD-10-CM | POA: Diagnosis not present

## 2022-03-07 DIAGNOSIS — D688 Other specified coagulation defects: Secondary | ICD-10-CM | POA: Diagnosis not present

## 2022-03-07 DIAGNOSIS — D509 Iron deficiency anemia, unspecified: Secondary | ICD-10-CM | POA: Diagnosis not present

## 2022-03-07 DIAGNOSIS — N186 End stage renal disease: Secondary | ICD-10-CM | POA: Diagnosis not present

## 2022-03-10 DIAGNOSIS — D509 Iron deficiency anemia, unspecified: Secondary | ICD-10-CM | POA: Diagnosis not present

## 2022-03-10 DIAGNOSIS — N2581 Secondary hyperparathyroidism of renal origin: Secondary | ICD-10-CM | POA: Diagnosis not present

## 2022-03-10 DIAGNOSIS — N186 End stage renal disease: Secondary | ICD-10-CM | POA: Diagnosis not present

## 2022-03-10 DIAGNOSIS — T8610 Unspecified complication of kidney transplant: Secondary | ICD-10-CM | POA: Diagnosis not present

## 2022-03-10 DIAGNOSIS — D688 Other specified coagulation defects: Secondary | ICD-10-CM | POA: Diagnosis not present

## 2022-03-10 DIAGNOSIS — Z992 Dependence on renal dialysis: Secondary | ICD-10-CM | POA: Diagnosis not present

## 2022-03-12 DIAGNOSIS — N2581 Secondary hyperparathyroidism of renal origin: Secondary | ICD-10-CM | POA: Diagnosis not present

## 2022-03-12 DIAGNOSIS — D688 Other specified coagulation defects: Secondary | ICD-10-CM | POA: Diagnosis not present

## 2022-03-12 DIAGNOSIS — N186 End stage renal disease: Secondary | ICD-10-CM | POA: Diagnosis not present

## 2022-03-12 DIAGNOSIS — D631 Anemia in chronic kidney disease: Secondary | ICD-10-CM | POA: Diagnosis not present

## 2022-03-12 DIAGNOSIS — D509 Iron deficiency anemia, unspecified: Secondary | ICD-10-CM | POA: Diagnosis not present

## 2022-03-12 DIAGNOSIS — Z992 Dependence on renal dialysis: Secondary | ICD-10-CM | POA: Diagnosis not present

## 2022-03-14 ENCOUNTER — Telehealth: Payer: Self-pay | Admitting: Internal Medicine

## 2022-03-14 NOTE — Telephone Encounter (Signed)
Pt is requesting a call. He would like to speak about a referral to urology. He said he currently has a referral but can't be seen until March 2024. Pt would like to discuss some urologists in the [place] area that may be able to see him sooner.   Please advise.

## 2022-03-19 DIAGNOSIS — N2581 Secondary hyperparathyroidism of renal origin: Secondary | ICD-10-CM | POA: Diagnosis not present

## 2022-03-19 DIAGNOSIS — Z992 Dependence on renal dialysis: Secondary | ICD-10-CM | POA: Diagnosis not present

## 2022-03-19 DIAGNOSIS — D509 Iron deficiency anemia, unspecified: Secondary | ICD-10-CM | POA: Diagnosis not present

## 2022-03-19 DIAGNOSIS — D631 Anemia in chronic kidney disease: Secondary | ICD-10-CM | POA: Diagnosis not present

## 2022-03-19 DIAGNOSIS — N186 End stage renal disease: Secondary | ICD-10-CM | POA: Diagnosis not present

## 2022-03-19 DIAGNOSIS — D688 Other specified coagulation defects: Secondary | ICD-10-CM | POA: Diagnosis not present

## 2022-03-21 DIAGNOSIS — D688 Other specified coagulation defects: Secondary | ICD-10-CM | POA: Diagnosis not present

## 2022-03-21 DIAGNOSIS — N2581 Secondary hyperparathyroidism of renal origin: Secondary | ICD-10-CM | POA: Diagnosis not present

## 2022-03-21 DIAGNOSIS — N186 End stage renal disease: Secondary | ICD-10-CM | POA: Diagnosis not present

## 2022-03-21 DIAGNOSIS — D631 Anemia in chronic kidney disease: Secondary | ICD-10-CM | POA: Diagnosis not present

## 2022-03-21 DIAGNOSIS — D509 Iron deficiency anemia, unspecified: Secondary | ICD-10-CM | POA: Diagnosis not present

## 2022-03-21 DIAGNOSIS — Z992 Dependence on renal dialysis: Secondary | ICD-10-CM | POA: Diagnosis not present

## 2022-03-23 ENCOUNTER — Other Ambulatory Visit: Payer: Self-pay | Admitting: Internal Medicine

## 2022-03-23 DIAGNOSIS — N135 Crossing vessel and stricture of ureter without hydronephrosis: Secondary | ICD-10-CM

## 2022-03-24 DIAGNOSIS — N186 End stage renal disease: Secondary | ICD-10-CM | POA: Diagnosis not present

## 2022-03-24 DIAGNOSIS — D631 Anemia in chronic kidney disease: Secondary | ICD-10-CM | POA: Diagnosis not present

## 2022-03-24 DIAGNOSIS — D688 Other specified coagulation defects: Secondary | ICD-10-CM | POA: Diagnosis not present

## 2022-03-24 DIAGNOSIS — N2581 Secondary hyperparathyroidism of renal origin: Secondary | ICD-10-CM | POA: Diagnosis not present

## 2022-03-24 DIAGNOSIS — D509 Iron deficiency anemia, unspecified: Secondary | ICD-10-CM | POA: Diagnosis not present

## 2022-03-24 DIAGNOSIS — Z992 Dependence on renal dialysis: Secondary | ICD-10-CM | POA: Diagnosis not present

## 2022-03-26 DIAGNOSIS — D631 Anemia in chronic kidney disease: Secondary | ICD-10-CM | POA: Diagnosis not present

## 2022-03-26 DIAGNOSIS — D688 Other specified coagulation defects: Secondary | ICD-10-CM | POA: Diagnosis not present

## 2022-03-26 DIAGNOSIS — Z992 Dependence on renal dialysis: Secondary | ICD-10-CM | POA: Diagnosis not present

## 2022-03-26 DIAGNOSIS — D509 Iron deficiency anemia, unspecified: Secondary | ICD-10-CM | POA: Diagnosis not present

## 2022-03-26 DIAGNOSIS — N2581 Secondary hyperparathyroidism of renal origin: Secondary | ICD-10-CM | POA: Diagnosis not present

## 2022-03-26 DIAGNOSIS — N186 End stage renal disease: Secondary | ICD-10-CM | POA: Diagnosis not present

## 2022-03-27 ENCOUNTER — Telehealth: Payer: Self-pay

## 2022-03-27 NOTE — Telephone Encounter (Signed)
Called pt, LVM to schedule an OV.

## 2022-03-27 NOTE — Telephone Encounter (Signed)
-----   Message from Carlynn Purl sent at 03/25/2022  9:39 AM EDT ----- Specialist office requires office note for diagnosis. Pt need a current office visit to send referral.

## 2022-03-28 DIAGNOSIS — N2581 Secondary hyperparathyroidism of renal origin: Secondary | ICD-10-CM | POA: Diagnosis not present

## 2022-03-28 DIAGNOSIS — D688 Other specified coagulation defects: Secondary | ICD-10-CM | POA: Diagnosis not present

## 2022-03-28 DIAGNOSIS — Z992 Dependence on renal dialysis: Secondary | ICD-10-CM | POA: Diagnosis not present

## 2022-03-28 DIAGNOSIS — D509 Iron deficiency anemia, unspecified: Secondary | ICD-10-CM | POA: Diagnosis not present

## 2022-03-28 DIAGNOSIS — D631 Anemia in chronic kidney disease: Secondary | ICD-10-CM | POA: Diagnosis not present

## 2022-03-28 DIAGNOSIS — N186 End stage renal disease: Secondary | ICD-10-CM | POA: Diagnosis not present

## 2022-03-31 DIAGNOSIS — Z992 Dependence on renal dialysis: Secondary | ICD-10-CM | POA: Diagnosis not present

## 2022-03-31 DIAGNOSIS — D509 Iron deficiency anemia, unspecified: Secondary | ICD-10-CM | POA: Diagnosis not present

## 2022-03-31 DIAGNOSIS — D631 Anemia in chronic kidney disease: Secondary | ICD-10-CM | POA: Diagnosis not present

## 2022-03-31 DIAGNOSIS — D688 Other specified coagulation defects: Secondary | ICD-10-CM | POA: Diagnosis not present

## 2022-03-31 DIAGNOSIS — N186 End stage renal disease: Secondary | ICD-10-CM | POA: Diagnosis not present

## 2022-03-31 DIAGNOSIS — N2581 Secondary hyperparathyroidism of renal origin: Secondary | ICD-10-CM | POA: Diagnosis not present

## 2022-04-02 DIAGNOSIS — Z992 Dependence on renal dialysis: Secondary | ICD-10-CM | POA: Diagnosis not present

## 2022-04-02 DIAGNOSIS — D631 Anemia in chronic kidney disease: Secondary | ICD-10-CM | POA: Diagnosis not present

## 2022-04-02 DIAGNOSIS — D509 Iron deficiency anemia, unspecified: Secondary | ICD-10-CM | POA: Diagnosis not present

## 2022-04-02 DIAGNOSIS — D688 Other specified coagulation defects: Secondary | ICD-10-CM | POA: Diagnosis not present

## 2022-04-02 DIAGNOSIS — N186 End stage renal disease: Secondary | ICD-10-CM | POA: Diagnosis not present

## 2022-04-02 DIAGNOSIS — N2581 Secondary hyperparathyroidism of renal origin: Secondary | ICD-10-CM | POA: Diagnosis not present

## 2022-04-04 DIAGNOSIS — N2581 Secondary hyperparathyroidism of renal origin: Secondary | ICD-10-CM | POA: Diagnosis not present

## 2022-04-04 DIAGNOSIS — D688 Other specified coagulation defects: Secondary | ICD-10-CM | POA: Diagnosis not present

## 2022-04-04 DIAGNOSIS — D509 Iron deficiency anemia, unspecified: Secondary | ICD-10-CM | POA: Diagnosis not present

## 2022-04-04 DIAGNOSIS — Z992 Dependence on renal dialysis: Secondary | ICD-10-CM | POA: Diagnosis not present

## 2022-04-04 DIAGNOSIS — N186 End stage renal disease: Secondary | ICD-10-CM | POA: Diagnosis not present

## 2022-04-04 DIAGNOSIS — D631 Anemia in chronic kidney disease: Secondary | ICD-10-CM | POA: Diagnosis not present

## 2022-04-07 DIAGNOSIS — N186 End stage renal disease: Secondary | ICD-10-CM | POA: Diagnosis not present

## 2022-04-07 DIAGNOSIS — D509 Iron deficiency anemia, unspecified: Secondary | ICD-10-CM | POA: Diagnosis not present

## 2022-04-07 DIAGNOSIS — D688 Other specified coagulation defects: Secondary | ICD-10-CM | POA: Diagnosis not present

## 2022-04-07 DIAGNOSIS — N2581 Secondary hyperparathyroidism of renal origin: Secondary | ICD-10-CM | POA: Diagnosis not present

## 2022-04-07 DIAGNOSIS — Z992 Dependence on renal dialysis: Secondary | ICD-10-CM | POA: Diagnosis not present

## 2022-04-07 DIAGNOSIS — D631 Anemia in chronic kidney disease: Secondary | ICD-10-CM | POA: Diagnosis not present

## 2022-04-09 DIAGNOSIS — N186 End stage renal disease: Secondary | ICD-10-CM | POA: Diagnosis not present

## 2022-04-09 DIAGNOSIS — D688 Other specified coagulation defects: Secondary | ICD-10-CM | POA: Diagnosis not present

## 2022-04-09 DIAGNOSIS — Z992 Dependence on renal dialysis: Secondary | ICD-10-CM | POA: Diagnosis not present

## 2022-04-09 DIAGNOSIS — D631 Anemia in chronic kidney disease: Secondary | ICD-10-CM | POA: Diagnosis not present

## 2022-04-09 DIAGNOSIS — D509 Iron deficiency anemia, unspecified: Secondary | ICD-10-CM | POA: Diagnosis not present

## 2022-04-09 DIAGNOSIS — N2581 Secondary hyperparathyroidism of renal origin: Secondary | ICD-10-CM | POA: Diagnosis not present

## 2022-04-10 DIAGNOSIS — Z992 Dependence on renal dialysis: Secondary | ICD-10-CM | POA: Diagnosis not present

## 2022-04-10 DIAGNOSIS — N186 End stage renal disease: Secondary | ICD-10-CM | POA: Diagnosis not present

## 2022-04-10 DIAGNOSIS — T8610 Unspecified complication of kidney transplant: Secondary | ICD-10-CM | POA: Diagnosis not present

## 2022-04-11 DIAGNOSIS — D688 Other specified coagulation defects: Secondary | ICD-10-CM | POA: Diagnosis not present

## 2022-04-11 DIAGNOSIS — N186 End stage renal disease: Secondary | ICD-10-CM | POA: Diagnosis not present

## 2022-04-11 DIAGNOSIS — N2581 Secondary hyperparathyroidism of renal origin: Secondary | ICD-10-CM | POA: Diagnosis not present

## 2022-04-11 DIAGNOSIS — Z992 Dependence on renal dialysis: Secondary | ICD-10-CM | POA: Diagnosis not present

## 2022-04-14 DIAGNOSIS — N2581 Secondary hyperparathyroidism of renal origin: Secondary | ICD-10-CM | POA: Diagnosis not present

## 2022-04-14 DIAGNOSIS — Z992 Dependence on renal dialysis: Secondary | ICD-10-CM | POA: Diagnosis not present

## 2022-04-14 DIAGNOSIS — N186 End stage renal disease: Secondary | ICD-10-CM | POA: Diagnosis not present

## 2022-04-14 DIAGNOSIS — D688 Other specified coagulation defects: Secondary | ICD-10-CM | POA: Diagnosis not present

## 2022-04-16 DIAGNOSIS — D688 Other specified coagulation defects: Secondary | ICD-10-CM | POA: Diagnosis not present

## 2022-04-16 DIAGNOSIS — Z992 Dependence on renal dialysis: Secondary | ICD-10-CM | POA: Diagnosis not present

## 2022-04-16 DIAGNOSIS — N2581 Secondary hyperparathyroidism of renal origin: Secondary | ICD-10-CM | POA: Diagnosis not present

## 2022-04-16 DIAGNOSIS — N186 End stage renal disease: Secondary | ICD-10-CM | POA: Diagnosis not present

## 2022-04-18 DIAGNOSIS — N186 End stage renal disease: Secondary | ICD-10-CM | POA: Diagnosis not present

## 2022-04-18 DIAGNOSIS — N2581 Secondary hyperparathyroidism of renal origin: Secondary | ICD-10-CM | POA: Diagnosis not present

## 2022-04-18 DIAGNOSIS — Z992 Dependence on renal dialysis: Secondary | ICD-10-CM | POA: Diagnosis not present

## 2022-04-18 DIAGNOSIS — D688 Other specified coagulation defects: Secondary | ICD-10-CM | POA: Diagnosis not present

## 2022-04-21 DIAGNOSIS — Z992 Dependence on renal dialysis: Secondary | ICD-10-CM | POA: Diagnosis not present

## 2022-04-21 DIAGNOSIS — N186 End stage renal disease: Secondary | ICD-10-CM | POA: Diagnosis not present

## 2022-04-21 DIAGNOSIS — D688 Other specified coagulation defects: Secondary | ICD-10-CM | POA: Diagnosis not present

## 2022-04-21 DIAGNOSIS — N2581 Secondary hyperparathyroidism of renal origin: Secondary | ICD-10-CM | POA: Diagnosis not present

## 2022-04-23 DIAGNOSIS — N2581 Secondary hyperparathyroidism of renal origin: Secondary | ICD-10-CM | POA: Diagnosis not present

## 2022-04-23 DIAGNOSIS — N186 End stage renal disease: Secondary | ICD-10-CM | POA: Diagnosis not present

## 2022-04-23 DIAGNOSIS — Z992 Dependence on renal dialysis: Secondary | ICD-10-CM | POA: Diagnosis not present

## 2022-04-23 DIAGNOSIS — D688 Other specified coagulation defects: Secondary | ICD-10-CM | POA: Diagnosis not present

## 2022-04-25 DIAGNOSIS — N2581 Secondary hyperparathyroidism of renal origin: Secondary | ICD-10-CM | POA: Diagnosis not present

## 2022-04-25 DIAGNOSIS — D688 Other specified coagulation defects: Secondary | ICD-10-CM | POA: Diagnosis not present

## 2022-04-25 DIAGNOSIS — N186 End stage renal disease: Secondary | ICD-10-CM | POA: Diagnosis not present

## 2022-04-25 DIAGNOSIS — Z992 Dependence on renal dialysis: Secondary | ICD-10-CM | POA: Diagnosis not present

## 2022-04-30 DIAGNOSIS — Z992 Dependence on renal dialysis: Secondary | ICD-10-CM | POA: Diagnosis not present

## 2022-04-30 DIAGNOSIS — D688 Other specified coagulation defects: Secondary | ICD-10-CM | POA: Diagnosis not present

## 2022-04-30 DIAGNOSIS — N186 End stage renal disease: Secondary | ICD-10-CM | POA: Diagnosis not present

## 2022-04-30 DIAGNOSIS — N2581 Secondary hyperparathyroidism of renal origin: Secondary | ICD-10-CM | POA: Diagnosis not present

## 2022-05-02 DIAGNOSIS — Z992 Dependence on renal dialysis: Secondary | ICD-10-CM | POA: Diagnosis not present

## 2022-05-02 DIAGNOSIS — N186 End stage renal disease: Secondary | ICD-10-CM | POA: Diagnosis not present

## 2022-05-02 DIAGNOSIS — N2581 Secondary hyperparathyroidism of renal origin: Secondary | ICD-10-CM | POA: Diagnosis not present

## 2022-05-02 DIAGNOSIS — D688 Other specified coagulation defects: Secondary | ICD-10-CM | POA: Diagnosis not present

## 2022-05-05 DIAGNOSIS — Z992 Dependence on renal dialysis: Secondary | ICD-10-CM | POA: Diagnosis not present

## 2022-05-05 DIAGNOSIS — N186 End stage renal disease: Secondary | ICD-10-CM | POA: Diagnosis not present

## 2022-05-05 DIAGNOSIS — D688 Other specified coagulation defects: Secondary | ICD-10-CM | POA: Diagnosis not present

## 2022-05-05 DIAGNOSIS — N2581 Secondary hyperparathyroidism of renal origin: Secondary | ICD-10-CM | POA: Diagnosis not present

## 2022-05-10 DIAGNOSIS — T8610 Unspecified complication of kidney transplant: Secondary | ICD-10-CM | POA: Diagnosis not present

## 2022-05-10 DIAGNOSIS — N186 End stage renal disease: Secondary | ICD-10-CM | POA: Diagnosis not present

## 2022-05-10 DIAGNOSIS — Z992 Dependence on renal dialysis: Secondary | ICD-10-CM | POA: Diagnosis not present

## 2022-05-12 DIAGNOSIS — N2581 Secondary hyperparathyroidism of renal origin: Secondary | ICD-10-CM | POA: Diagnosis not present

## 2022-05-12 DIAGNOSIS — D631 Anemia in chronic kidney disease: Secondary | ICD-10-CM | POA: Diagnosis not present

## 2022-05-12 DIAGNOSIS — D509 Iron deficiency anemia, unspecified: Secondary | ICD-10-CM | POA: Diagnosis not present

## 2022-05-12 DIAGNOSIS — Z992 Dependence on renal dialysis: Secondary | ICD-10-CM | POA: Diagnosis not present

## 2022-05-12 DIAGNOSIS — D688 Other specified coagulation defects: Secondary | ICD-10-CM | POA: Diagnosis not present

## 2022-05-12 DIAGNOSIS — N186 End stage renal disease: Secondary | ICD-10-CM | POA: Diagnosis not present

## 2022-05-14 DIAGNOSIS — D688 Other specified coagulation defects: Secondary | ICD-10-CM | POA: Diagnosis not present

## 2022-05-14 DIAGNOSIS — N2581 Secondary hyperparathyroidism of renal origin: Secondary | ICD-10-CM | POA: Diagnosis not present

## 2022-05-14 DIAGNOSIS — D631 Anemia in chronic kidney disease: Secondary | ICD-10-CM | POA: Diagnosis not present

## 2022-05-14 DIAGNOSIS — Z992 Dependence on renal dialysis: Secondary | ICD-10-CM | POA: Diagnosis not present

## 2022-05-14 DIAGNOSIS — N186 End stage renal disease: Secondary | ICD-10-CM | POA: Diagnosis not present

## 2022-05-14 DIAGNOSIS — D509 Iron deficiency anemia, unspecified: Secondary | ICD-10-CM | POA: Diagnosis not present

## 2022-05-16 DIAGNOSIS — D509 Iron deficiency anemia, unspecified: Secondary | ICD-10-CM | POA: Diagnosis not present

## 2022-05-16 DIAGNOSIS — D688 Other specified coagulation defects: Secondary | ICD-10-CM | POA: Diagnosis not present

## 2022-05-16 DIAGNOSIS — N186 End stage renal disease: Secondary | ICD-10-CM | POA: Diagnosis not present

## 2022-05-16 DIAGNOSIS — Z992 Dependence on renal dialysis: Secondary | ICD-10-CM | POA: Diagnosis not present

## 2022-05-16 DIAGNOSIS — D631 Anemia in chronic kidney disease: Secondary | ICD-10-CM | POA: Diagnosis not present

## 2022-05-16 DIAGNOSIS — N2581 Secondary hyperparathyroidism of renal origin: Secondary | ICD-10-CM | POA: Diagnosis not present

## 2022-05-19 DIAGNOSIS — D509 Iron deficiency anemia, unspecified: Secondary | ICD-10-CM | POA: Diagnosis not present

## 2022-05-19 DIAGNOSIS — Z992 Dependence on renal dialysis: Secondary | ICD-10-CM | POA: Diagnosis not present

## 2022-05-19 DIAGNOSIS — D631 Anemia in chronic kidney disease: Secondary | ICD-10-CM | POA: Diagnosis not present

## 2022-05-19 DIAGNOSIS — N186 End stage renal disease: Secondary | ICD-10-CM | POA: Diagnosis not present

## 2022-05-19 DIAGNOSIS — N2581 Secondary hyperparathyroidism of renal origin: Secondary | ICD-10-CM | POA: Diagnosis not present

## 2022-05-19 DIAGNOSIS — D688 Other specified coagulation defects: Secondary | ICD-10-CM | POA: Diagnosis not present

## 2022-05-23 DIAGNOSIS — D631 Anemia in chronic kidney disease: Secondary | ICD-10-CM | POA: Diagnosis not present

## 2022-05-23 DIAGNOSIS — D509 Iron deficiency anemia, unspecified: Secondary | ICD-10-CM | POA: Diagnosis not present

## 2022-05-23 DIAGNOSIS — Z992 Dependence on renal dialysis: Secondary | ICD-10-CM | POA: Diagnosis not present

## 2022-05-23 DIAGNOSIS — N186 End stage renal disease: Secondary | ICD-10-CM | POA: Diagnosis not present

## 2022-05-23 DIAGNOSIS — N2581 Secondary hyperparathyroidism of renal origin: Secondary | ICD-10-CM | POA: Diagnosis not present

## 2022-05-23 DIAGNOSIS — D688 Other specified coagulation defects: Secondary | ICD-10-CM | POA: Diagnosis not present

## 2022-05-26 DIAGNOSIS — N2581 Secondary hyperparathyroidism of renal origin: Secondary | ICD-10-CM | POA: Diagnosis not present

## 2022-05-26 DIAGNOSIS — Z992 Dependence on renal dialysis: Secondary | ICD-10-CM | POA: Diagnosis not present

## 2022-05-26 DIAGNOSIS — D688 Other specified coagulation defects: Secondary | ICD-10-CM | POA: Diagnosis not present

## 2022-05-26 DIAGNOSIS — D631 Anemia in chronic kidney disease: Secondary | ICD-10-CM | POA: Diagnosis not present

## 2022-05-26 DIAGNOSIS — N186 End stage renal disease: Secondary | ICD-10-CM | POA: Diagnosis not present

## 2022-05-26 DIAGNOSIS — D509 Iron deficiency anemia, unspecified: Secondary | ICD-10-CM | POA: Diagnosis not present

## 2022-05-28 DIAGNOSIS — D688 Other specified coagulation defects: Secondary | ICD-10-CM | POA: Diagnosis not present

## 2022-05-28 DIAGNOSIS — Z992 Dependence on renal dialysis: Secondary | ICD-10-CM | POA: Diagnosis not present

## 2022-05-28 DIAGNOSIS — D509 Iron deficiency anemia, unspecified: Secondary | ICD-10-CM | POA: Diagnosis not present

## 2022-05-28 DIAGNOSIS — D631 Anemia in chronic kidney disease: Secondary | ICD-10-CM | POA: Diagnosis not present

## 2022-05-28 DIAGNOSIS — N2581 Secondary hyperparathyroidism of renal origin: Secondary | ICD-10-CM | POA: Diagnosis not present

## 2022-05-28 DIAGNOSIS — N186 End stage renal disease: Secondary | ICD-10-CM | POA: Diagnosis not present

## 2022-05-30 DIAGNOSIS — D688 Other specified coagulation defects: Secondary | ICD-10-CM | POA: Diagnosis not present

## 2022-05-30 DIAGNOSIS — N2581 Secondary hyperparathyroidism of renal origin: Secondary | ICD-10-CM | POA: Diagnosis not present

## 2022-05-30 DIAGNOSIS — D631 Anemia in chronic kidney disease: Secondary | ICD-10-CM | POA: Diagnosis not present

## 2022-05-30 DIAGNOSIS — N186 End stage renal disease: Secondary | ICD-10-CM | POA: Diagnosis not present

## 2022-05-30 DIAGNOSIS — Z992 Dependence on renal dialysis: Secondary | ICD-10-CM | POA: Diagnosis not present

## 2022-05-30 DIAGNOSIS — D509 Iron deficiency anemia, unspecified: Secondary | ICD-10-CM | POA: Diagnosis not present

## 2022-06-02 DIAGNOSIS — N186 End stage renal disease: Secondary | ICD-10-CM | POA: Diagnosis not present

## 2022-06-02 DIAGNOSIS — D688 Other specified coagulation defects: Secondary | ICD-10-CM | POA: Diagnosis not present

## 2022-06-02 DIAGNOSIS — D509 Iron deficiency anemia, unspecified: Secondary | ICD-10-CM | POA: Diagnosis not present

## 2022-06-02 DIAGNOSIS — D631 Anemia in chronic kidney disease: Secondary | ICD-10-CM | POA: Diagnosis not present

## 2022-06-02 DIAGNOSIS — N2581 Secondary hyperparathyroidism of renal origin: Secondary | ICD-10-CM | POA: Diagnosis not present

## 2022-06-02 DIAGNOSIS — Z992 Dependence on renal dialysis: Secondary | ICD-10-CM | POA: Diagnosis not present

## 2022-06-06 DIAGNOSIS — D631 Anemia in chronic kidney disease: Secondary | ICD-10-CM | POA: Diagnosis not present

## 2022-06-06 DIAGNOSIS — Z992 Dependence on renal dialysis: Secondary | ICD-10-CM | POA: Diagnosis not present

## 2022-06-06 DIAGNOSIS — N186 End stage renal disease: Secondary | ICD-10-CM | POA: Diagnosis not present

## 2022-06-06 DIAGNOSIS — D509 Iron deficiency anemia, unspecified: Secondary | ICD-10-CM | POA: Diagnosis not present

## 2022-06-06 DIAGNOSIS — N2581 Secondary hyperparathyroidism of renal origin: Secondary | ICD-10-CM | POA: Diagnosis not present

## 2022-06-06 DIAGNOSIS — D688 Other specified coagulation defects: Secondary | ICD-10-CM | POA: Diagnosis not present

## 2022-06-09 DIAGNOSIS — Z992 Dependence on renal dialysis: Secondary | ICD-10-CM | POA: Diagnosis not present

## 2022-06-09 DIAGNOSIS — D509 Iron deficiency anemia, unspecified: Secondary | ICD-10-CM | POA: Diagnosis not present

## 2022-06-09 DIAGNOSIS — N186 End stage renal disease: Secondary | ICD-10-CM | POA: Diagnosis not present

## 2022-06-09 DIAGNOSIS — D688 Other specified coagulation defects: Secondary | ICD-10-CM | POA: Diagnosis not present

## 2022-06-09 DIAGNOSIS — N2581 Secondary hyperparathyroidism of renal origin: Secondary | ICD-10-CM | POA: Diagnosis not present

## 2022-06-09 DIAGNOSIS — D631 Anemia in chronic kidney disease: Secondary | ICD-10-CM | POA: Diagnosis not present

## 2022-06-10 DIAGNOSIS — T8610 Unspecified complication of kidney transplant: Secondary | ICD-10-CM | POA: Diagnosis not present

## 2022-06-10 DIAGNOSIS — N186 End stage renal disease: Secondary | ICD-10-CM | POA: Diagnosis not present

## 2022-06-10 DIAGNOSIS — Z992 Dependence on renal dialysis: Secondary | ICD-10-CM | POA: Diagnosis not present

## 2022-06-11 DIAGNOSIS — D509 Iron deficiency anemia, unspecified: Secondary | ICD-10-CM | POA: Diagnosis not present

## 2022-06-11 DIAGNOSIS — N2581 Secondary hyperparathyroidism of renal origin: Secondary | ICD-10-CM | POA: Diagnosis not present

## 2022-06-11 DIAGNOSIS — D631 Anemia in chronic kidney disease: Secondary | ICD-10-CM | POA: Diagnosis not present

## 2022-06-11 DIAGNOSIS — Z992 Dependence on renal dialysis: Secondary | ICD-10-CM | POA: Diagnosis not present

## 2022-06-11 DIAGNOSIS — N186 End stage renal disease: Secondary | ICD-10-CM | POA: Diagnosis not present

## 2022-06-11 DIAGNOSIS — D688 Other specified coagulation defects: Secondary | ICD-10-CM | POA: Diagnosis not present

## 2022-06-13 DIAGNOSIS — N186 End stage renal disease: Secondary | ICD-10-CM | POA: Diagnosis not present

## 2022-06-13 DIAGNOSIS — N2581 Secondary hyperparathyroidism of renal origin: Secondary | ICD-10-CM | POA: Diagnosis not present

## 2022-06-13 DIAGNOSIS — D688 Other specified coagulation defects: Secondary | ICD-10-CM | POA: Diagnosis not present

## 2022-06-13 DIAGNOSIS — D631 Anemia in chronic kidney disease: Secondary | ICD-10-CM | POA: Diagnosis not present

## 2022-06-13 DIAGNOSIS — Z992 Dependence on renal dialysis: Secondary | ICD-10-CM | POA: Diagnosis not present

## 2022-06-13 DIAGNOSIS — D509 Iron deficiency anemia, unspecified: Secondary | ICD-10-CM | POA: Diagnosis not present

## 2022-06-17 DIAGNOSIS — N2581 Secondary hyperparathyroidism of renal origin: Secondary | ICD-10-CM | POA: Diagnosis not present

## 2022-06-17 DIAGNOSIS — T82868A Thrombosis of vascular prosthetic devices, implants and grafts, initial encounter: Secondary | ICD-10-CM | POA: Diagnosis not present

## 2022-06-17 DIAGNOSIS — N186 End stage renal disease: Secondary | ICD-10-CM | POA: Diagnosis not present

## 2022-06-17 DIAGNOSIS — Z992 Dependence on renal dialysis: Secondary | ICD-10-CM | POA: Diagnosis not present

## 2022-06-17 DIAGNOSIS — I871 Compression of vein: Secondary | ICD-10-CM | POA: Diagnosis not present

## 2022-06-17 DIAGNOSIS — D509 Iron deficiency anemia, unspecified: Secondary | ICD-10-CM | POA: Diagnosis not present

## 2022-06-17 DIAGNOSIS — D631 Anemia in chronic kidney disease: Secondary | ICD-10-CM | POA: Diagnosis not present

## 2022-06-17 DIAGNOSIS — D688 Other specified coagulation defects: Secondary | ICD-10-CM | POA: Diagnosis not present

## 2022-06-18 DIAGNOSIS — N186 End stage renal disease: Secondary | ICD-10-CM | POA: Diagnosis not present

## 2022-06-18 DIAGNOSIS — Z87448 Personal history of other diseases of urinary system: Secondary | ICD-10-CM | POA: Diagnosis not present

## 2022-06-18 DIAGNOSIS — N35919 Unspecified urethral stricture, male, unspecified site: Secondary | ICD-10-CM | POA: Diagnosis not present

## 2022-06-18 DIAGNOSIS — E1122 Type 2 diabetes mellitus with diabetic chronic kidney disease: Secondary | ICD-10-CM | POA: Diagnosis not present

## 2022-06-18 DIAGNOSIS — Z01818 Encounter for other preprocedural examination: Secondary | ICD-10-CM | POA: Diagnosis not present

## 2022-06-18 DIAGNOSIS — Z992 Dependence on renal dialysis: Secondary | ICD-10-CM | POA: Diagnosis not present

## 2022-06-18 DIAGNOSIS — I120 Hypertensive chronic kidney disease with stage 5 chronic kidney disease or end stage renal disease: Secondary | ICD-10-CM | POA: Diagnosis not present

## 2022-06-18 DIAGNOSIS — N35912 Unspecified bulbous urethral stricture, male: Secondary | ICD-10-CM | POA: Diagnosis not present

## 2022-06-20 DIAGNOSIS — Z992 Dependence on renal dialysis: Secondary | ICD-10-CM | POA: Diagnosis not present

## 2022-06-20 DIAGNOSIS — D631 Anemia in chronic kidney disease: Secondary | ICD-10-CM | POA: Diagnosis not present

## 2022-06-20 DIAGNOSIS — N2581 Secondary hyperparathyroidism of renal origin: Secondary | ICD-10-CM | POA: Diagnosis not present

## 2022-06-20 DIAGNOSIS — D509 Iron deficiency anemia, unspecified: Secondary | ICD-10-CM | POA: Diagnosis not present

## 2022-06-20 DIAGNOSIS — D688 Other specified coagulation defects: Secondary | ICD-10-CM | POA: Diagnosis not present

## 2022-06-20 DIAGNOSIS — N186 End stage renal disease: Secondary | ICD-10-CM | POA: Diagnosis not present

## 2022-06-23 DIAGNOSIS — N186 End stage renal disease: Secondary | ICD-10-CM | POA: Diagnosis not present

## 2022-06-23 DIAGNOSIS — N2581 Secondary hyperparathyroidism of renal origin: Secondary | ICD-10-CM | POA: Diagnosis not present

## 2022-06-23 DIAGNOSIS — Z992 Dependence on renal dialysis: Secondary | ICD-10-CM | POA: Diagnosis not present

## 2022-06-23 DIAGNOSIS — D688 Other specified coagulation defects: Secondary | ICD-10-CM | POA: Diagnosis not present

## 2022-06-23 DIAGNOSIS — D509 Iron deficiency anemia, unspecified: Secondary | ICD-10-CM | POA: Diagnosis not present

## 2022-06-23 DIAGNOSIS — D631 Anemia in chronic kidney disease: Secondary | ICD-10-CM | POA: Diagnosis not present

## 2022-06-27 DIAGNOSIS — Z992 Dependence on renal dialysis: Secondary | ICD-10-CM | POA: Diagnosis not present

## 2022-06-27 DIAGNOSIS — N186 End stage renal disease: Secondary | ICD-10-CM | POA: Diagnosis not present

## 2022-06-27 DIAGNOSIS — D688 Other specified coagulation defects: Secondary | ICD-10-CM | POA: Diagnosis not present

## 2022-06-27 DIAGNOSIS — N2581 Secondary hyperparathyroidism of renal origin: Secondary | ICD-10-CM | POA: Diagnosis not present

## 2022-06-27 DIAGNOSIS — D509 Iron deficiency anemia, unspecified: Secondary | ICD-10-CM | POA: Diagnosis not present

## 2022-06-27 DIAGNOSIS — D631 Anemia in chronic kidney disease: Secondary | ICD-10-CM | POA: Diagnosis not present

## 2022-06-29 DIAGNOSIS — D509 Iron deficiency anemia, unspecified: Secondary | ICD-10-CM | POA: Diagnosis not present

## 2022-06-29 DIAGNOSIS — Z992 Dependence on renal dialysis: Secondary | ICD-10-CM | POA: Diagnosis not present

## 2022-06-29 DIAGNOSIS — D631 Anemia in chronic kidney disease: Secondary | ICD-10-CM | POA: Diagnosis not present

## 2022-06-29 DIAGNOSIS — N2581 Secondary hyperparathyroidism of renal origin: Secondary | ICD-10-CM | POA: Diagnosis not present

## 2022-06-29 DIAGNOSIS — N186 End stage renal disease: Secondary | ICD-10-CM | POA: Diagnosis not present

## 2022-06-29 DIAGNOSIS — D688 Other specified coagulation defects: Secondary | ICD-10-CM | POA: Diagnosis not present

## 2022-06-30 LAB — HM DIABETES EYE EXAM

## 2022-07-01 DIAGNOSIS — D688 Other specified coagulation defects: Secondary | ICD-10-CM | POA: Diagnosis not present

## 2022-07-01 DIAGNOSIS — D631 Anemia in chronic kidney disease: Secondary | ICD-10-CM | POA: Diagnosis not present

## 2022-07-01 DIAGNOSIS — N2581 Secondary hyperparathyroidism of renal origin: Secondary | ICD-10-CM | POA: Diagnosis not present

## 2022-07-01 DIAGNOSIS — N186 End stage renal disease: Secondary | ICD-10-CM | POA: Diagnosis not present

## 2022-07-01 DIAGNOSIS — Z992 Dependence on renal dialysis: Secondary | ICD-10-CM | POA: Diagnosis not present

## 2022-07-01 DIAGNOSIS — D509 Iron deficiency anemia, unspecified: Secondary | ICD-10-CM | POA: Diagnosis not present

## 2022-07-04 DIAGNOSIS — Z992 Dependence on renal dialysis: Secondary | ICD-10-CM | POA: Diagnosis not present

## 2022-07-04 DIAGNOSIS — D631 Anemia in chronic kidney disease: Secondary | ICD-10-CM | POA: Diagnosis not present

## 2022-07-04 DIAGNOSIS — D509 Iron deficiency anemia, unspecified: Secondary | ICD-10-CM | POA: Diagnosis not present

## 2022-07-04 DIAGNOSIS — N2581 Secondary hyperparathyroidism of renal origin: Secondary | ICD-10-CM | POA: Diagnosis not present

## 2022-07-04 DIAGNOSIS — D688 Other specified coagulation defects: Secondary | ICD-10-CM | POA: Diagnosis not present

## 2022-07-04 DIAGNOSIS — N186 End stage renal disease: Secondary | ICD-10-CM | POA: Diagnosis not present

## 2022-07-07 DIAGNOSIS — D509 Iron deficiency anemia, unspecified: Secondary | ICD-10-CM | POA: Diagnosis not present

## 2022-07-07 DIAGNOSIS — Z992 Dependence on renal dialysis: Secondary | ICD-10-CM | POA: Diagnosis not present

## 2022-07-07 DIAGNOSIS — D631 Anemia in chronic kidney disease: Secondary | ICD-10-CM | POA: Diagnosis not present

## 2022-07-07 DIAGNOSIS — N2581 Secondary hyperparathyroidism of renal origin: Secondary | ICD-10-CM | POA: Diagnosis not present

## 2022-07-07 DIAGNOSIS — N186 End stage renal disease: Secondary | ICD-10-CM | POA: Diagnosis not present

## 2022-07-07 DIAGNOSIS — D688 Other specified coagulation defects: Secondary | ICD-10-CM | POA: Diagnosis not present

## 2022-07-09 DIAGNOSIS — H2513 Age-related nuclear cataract, bilateral: Secondary | ICD-10-CM | POA: Diagnosis not present

## 2022-07-09 DIAGNOSIS — H47393 Other disorders of optic disc, bilateral: Secondary | ICD-10-CM | POA: Diagnosis not present

## 2022-07-09 DIAGNOSIS — E113293 Type 2 diabetes mellitus with mild nonproliferative diabetic retinopathy without macular edema, bilateral: Secondary | ICD-10-CM | POA: Diagnosis not present

## 2022-07-09 LAB — HM DIABETES EYE EXAM

## 2022-07-10 DIAGNOSIS — T8610 Unspecified complication of kidney transplant: Secondary | ICD-10-CM | POA: Diagnosis not present

## 2022-07-10 DIAGNOSIS — Z992 Dependence on renal dialysis: Secondary | ICD-10-CM | POA: Diagnosis not present

## 2022-07-10 DIAGNOSIS — N186 End stage renal disease: Secondary | ICD-10-CM | POA: Diagnosis not present

## 2022-07-11 DIAGNOSIS — N186 End stage renal disease: Secondary | ICD-10-CM | POA: Diagnosis not present

## 2022-07-11 DIAGNOSIS — D688 Other specified coagulation defects: Secondary | ICD-10-CM | POA: Diagnosis not present

## 2022-07-11 DIAGNOSIS — N2581 Secondary hyperparathyroidism of renal origin: Secondary | ICD-10-CM | POA: Diagnosis not present

## 2022-07-11 DIAGNOSIS — Z992 Dependence on renal dialysis: Secondary | ICD-10-CM | POA: Diagnosis not present

## 2022-07-11 DIAGNOSIS — D631 Anemia in chronic kidney disease: Secondary | ICD-10-CM | POA: Diagnosis not present

## 2022-07-14 DIAGNOSIS — N186 End stage renal disease: Secondary | ICD-10-CM | POA: Diagnosis not present

## 2022-07-14 DIAGNOSIS — D688 Other specified coagulation defects: Secondary | ICD-10-CM | POA: Diagnosis not present

## 2022-07-14 DIAGNOSIS — D631 Anemia in chronic kidney disease: Secondary | ICD-10-CM | POA: Diagnosis not present

## 2022-07-14 DIAGNOSIS — Z992 Dependence on renal dialysis: Secondary | ICD-10-CM | POA: Diagnosis not present

## 2022-07-14 DIAGNOSIS — N2581 Secondary hyperparathyroidism of renal origin: Secondary | ICD-10-CM | POA: Diagnosis not present

## 2022-07-18 DIAGNOSIS — N2581 Secondary hyperparathyroidism of renal origin: Secondary | ICD-10-CM | POA: Diagnosis not present

## 2022-07-18 DIAGNOSIS — Z992 Dependence on renal dialysis: Secondary | ICD-10-CM | POA: Diagnosis not present

## 2022-07-18 DIAGNOSIS — D631 Anemia in chronic kidney disease: Secondary | ICD-10-CM | POA: Diagnosis not present

## 2022-07-18 DIAGNOSIS — D688 Other specified coagulation defects: Secondary | ICD-10-CM | POA: Diagnosis not present

## 2022-07-18 DIAGNOSIS — N186 End stage renal disease: Secondary | ICD-10-CM | POA: Diagnosis not present

## 2022-07-21 DIAGNOSIS — D688 Other specified coagulation defects: Secondary | ICD-10-CM | POA: Diagnosis not present

## 2022-07-21 DIAGNOSIS — N186 End stage renal disease: Secondary | ICD-10-CM | POA: Diagnosis not present

## 2022-07-21 DIAGNOSIS — Z992 Dependence on renal dialysis: Secondary | ICD-10-CM | POA: Diagnosis not present

## 2022-07-21 DIAGNOSIS — D631 Anemia in chronic kidney disease: Secondary | ICD-10-CM | POA: Diagnosis not present

## 2022-07-21 DIAGNOSIS — N2581 Secondary hyperparathyroidism of renal origin: Secondary | ICD-10-CM | POA: Diagnosis not present

## 2022-07-23 DIAGNOSIS — N2581 Secondary hyperparathyroidism of renal origin: Secondary | ICD-10-CM | POA: Diagnosis not present

## 2022-07-23 DIAGNOSIS — D631 Anemia in chronic kidney disease: Secondary | ICD-10-CM | POA: Diagnosis not present

## 2022-07-23 DIAGNOSIS — Z992 Dependence on renal dialysis: Secondary | ICD-10-CM | POA: Diagnosis not present

## 2022-07-23 DIAGNOSIS — D688 Other specified coagulation defects: Secondary | ICD-10-CM | POA: Diagnosis not present

## 2022-07-23 DIAGNOSIS — N186 End stage renal disease: Secondary | ICD-10-CM | POA: Diagnosis not present

## 2022-07-25 DIAGNOSIS — D631 Anemia in chronic kidney disease: Secondary | ICD-10-CM | POA: Diagnosis not present

## 2022-07-25 DIAGNOSIS — D688 Other specified coagulation defects: Secondary | ICD-10-CM | POA: Diagnosis not present

## 2022-07-25 DIAGNOSIS — N186 End stage renal disease: Secondary | ICD-10-CM | POA: Diagnosis not present

## 2022-07-25 DIAGNOSIS — Z992 Dependence on renal dialysis: Secondary | ICD-10-CM | POA: Diagnosis not present

## 2022-07-25 DIAGNOSIS — N2581 Secondary hyperparathyroidism of renal origin: Secondary | ICD-10-CM | POA: Diagnosis not present

## 2022-07-28 DIAGNOSIS — N2581 Secondary hyperparathyroidism of renal origin: Secondary | ICD-10-CM | POA: Diagnosis not present

## 2022-07-28 DIAGNOSIS — D688 Other specified coagulation defects: Secondary | ICD-10-CM | POA: Diagnosis not present

## 2022-07-28 DIAGNOSIS — N186 End stage renal disease: Secondary | ICD-10-CM | POA: Diagnosis not present

## 2022-07-28 DIAGNOSIS — Z992 Dependence on renal dialysis: Secondary | ICD-10-CM | POA: Diagnosis not present

## 2022-07-28 DIAGNOSIS — D631 Anemia in chronic kidney disease: Secondary | ICD-10-CM | POA: Diagnosis not present

## 2022-08-01 DIAGNOSIS — N2581 Secondary hyperparathyroidism of renal origin: Secondary | ICD-10-CM | POA: Diagnosis not present

## 2022-08-01 DIAGNOSIS — Z992 Dependence on renal dialysis: Secondary | ICD-10-CM | POA: Diagnosis not present

## 2022-08-01 DIAGNOSIS — N186 End stage renal disease: Secondary | ICD-10-CM | POA: Diagnosis not present

## 2022-08-01 DIAGNOSIS — D688 Other specified coagulation defects: Secondary | ICD-10-CM | POA: Diagnosis not present

## 2022-08-01 DIAGNOSIS — D631 Anemia in chronic kidney disease: Secondary | ICD-10-CM | POA: Diagnosis not present

## 2022-08-06 DIAGNOSIS — N2581 Secondary hyperparathyroidism of renal origin: Secondary | ICD-10-CM | POA: Diagnosis not present

## 2022-08-06 DIAGNOSIS — D688 Other specified coagulation defects: Secondary | ICD-10-CM | POA: Diagnosis not present

## 2022-08-06 DIAGNOSIS — N186 End stage renal disease: Secondary | ICD-10-CM | POA: Diagnosis not present

## 2022-08-06 DIAGNOSIS — D631 Anemia in chronic kidney disease: Secondary | ICD-10-CM | POA: Diagnosis not present

## 2022-08-06 DIAGNOSIS — Z992 Dependence on renal dialysis: Secondary | ICD-10-CM | POA: Diagnosis not present

## 2022-08-08 DIAGNOSIS — N2581 Secondary hyperparathyroidism of renal origin: Secondary | ICD-10-CM | POA: Diagnosis not present

## 2022-08-08 DIAGNOSIS — D688 Other specified coagulation defects: Secondary | ICD-10-CM | POA: Diagnosis not present

## 2022-08-08 DIAGNOSIS — D631 Anemia in chronic kidney disease: Secondary | ICD-10-CM | POA: Diagnosis not present

## 2022-08-08 DIAGNOSIS — N186 End stage renal disease: Secondary | ICD-10-CM | POA: Diagnosis not present

## 2022-08-08 DIAGNOSIS — Z992 Dependence on renal dialysis: Secondary | ICD-10-CM | POA: Diagnosis not present

## 2022-08-10 DIAGNOSIS — T8610 Unspecified complication of kidney transplant: Secondary | ICD-10-CM | POA: Diagnosis not present

## 2022-08-10 DIAGNOSIS — N186 End stage renal disease: Secondary | ICD-10-CM | POA: Diagnosis not present

## 2022-08-10 DIAGNOSIS — Z992 Dependence on renal dialysis: Secondary | ICD-10-CM | POA: Diagnosis not present

## 2022-08-13 DIAGNOSIS — N186 End stage renal disease: Secondary | ICD-10-CM | POA: Diagnosis not present

## 2022-08-13 DIAGNOSIS — D688 Other specified coagulation defects: Secondary | ICD-10-CM | POA: Diagnosis not present

## 2022-08-13 DIAGNOSIS — D509 Iron deficiency anemia, unspecified: Secondary | ICD-10-CM | POA: Diagnosis not present

## 2022-08-13 DIAGNOSIS — D631 Anemia in chronic kidney disease: Secondary | ICD-10-CM | POA: Diagnosis not present

## 2022-08-13 DIAGNOSIS — N2581 Secondary hyperparathyroidism of renal origin: Secondary | ICD-10-CM | POA: Diagnosis not present

## 2022-08-13 DIAGNOSIS — Z992 Dependence on renal dialysis: Secondary | ICD-10-CM | POA: Diagnosis not present

## 2022-08-15 DIAGNOSIS — N2581 Secondary hyperparathyroidism of renal origin: Secondary | ICD-10-CM | POA: Diagnosis not present

## 2022-08-15 DIAGNOSIS — Z992 Dependence on renal dialysis: Secondary | ICD-10-CM | POA: Diagnosis not present

## 2022-08-15 DIAGNOSIS — D509 Iron deficiency anemia, unspecified: Secondary | ICD-10-CM | POA: Diagnosis not present

## 2022-08-15 DIAGNOSIS — N186 End stage renal disease: Secondary | ICD-10-CM | POA: Diagnosis not present

## 2022-08-15 DIAGNOSIS — D688 Other specified coagulation defects: Secondary | ICD-10-CM | POA: Diagnosis not present

## 2022-08-15 DIAGNOSIS — D631 Anemia in chronic kidney disease: Secondary | ICD-10-CM | POA: Diagnosis not present

## 2022-08-18 DIAGNOSIS — D631 Anemia in chronic kidney disease: Secondary | ICD-10-CM | POA: Diagnosis not present

## 2022-08-18 DIAGNOSIS — Z992 Dependence on renal dialysis: Secondary | ICD-10-CM | POA: Diagnosis not present

## 2022-08-18 DIAGNOSIS — N186 End stage renal disease: Secondary | ICD-10-CM | POA: Diagnosis not present

## 2022-08-18 DIAGNOSIS — N2581 Secondary hyperparathyroidism of renal origin: Secondary | ICD-10-CM | POA: Diagnosis not present

## 2022-08-18 DIAGNOSIS — D688 Other specified coagulation defects: Secondary | ICD-10-CM | POA: Diagnosis not present

## 2022-08-18 DIAGNOSIS — D509 Iron deficiency anemia, unspecified: Secondary | ICD-10-CM | POA: Diagnosis not present

## 2022-08-22 DIAGNOSIS — D688 Other specified coagulation defects: Secondary | ICD-10-CM | POA: Diagnosis not present

## 2022-08-22 DIAGNOSIS — D509 Iron deficiency anemia, unspecified: Secondary | ICD-10-CM | POA: Diagnosis not present

## 2022-08-22 DIAGNOSIS — N186 End stage renal disease: Secondary | ICD-10-CM | POA: Diagnosis not present

## 2022-08-22 DIAGNOSIS — N2581 Secondary hyperparathyroidism of renal origin: Secondary | ICD-10-CM | POA: Diagnosis not present

## 2022-08-22 DIAGNOSIS — Z992 Dependence on renal dialysis: Secondary | ICD-10-CM | POA: Diagnosis not present

## 2022-08-22 DIAGNOSIS — D631 Anemia in chronic kidney disease: Secondary | ICD-10-CM | POA: Diagnosis not present

## 2022-08-25 DIAGNOSIS — D688 Other specified coagulation defects: Secondary | ICD-10-CM | POA: Diagnosis not present

## 2022-08-25 DIAGNOSIS — D509 Iron deficiency anemia, unspecified: Secondary | ICD-10-CM | POA: Diagnosis not present

## 2022-08-25 DIAGNOSIS — N186 End stage renal disease: Secondary | ICD-10-CM | POA: Diagnosis not present

## 2022-08-25 DIAGNOSIS — D631 Anemia in chronic kidney disease: Secondary | ICD-10-CM | POA: Diagnosis not present

## 2022-08-25 DIAGNOSIS — Z992 Dependence on renal dialysis: Secondary | ICD-10-CM | POA: Diagnosis not present

## 2022-08-25 DIAGNOSIS — N2581 Secondary hyperparathyroidism of renal origin: Secondary | ICD-10-CM | POA: Diagnosis not present

## 2022-08-26 ENCOUNTER — Ambulatory Visit
Admission: RE | Admit: 2022-08-26 | Discharge: 2022-08-26 | Disposition: A | Discharge status: Home / Self Care | Payer: Medicare Other | Source: Ambulatory Visit | Attending: Nephrology | Admitting: Nephrology

## 2022-08-26 ENCOUNTER — Other Ambulatory Visit: Payer: Self-pay | Admitting: Nephrology

## 2022-08-26 DIAGNOSIS — N186 End stage renal disease: Secondary | ICD-10-CM

## 2022-08-26 DIAGNOSIS — I517 Cardiomegaly: Secondary | ICD-10-CM | POA: Diagnosis not present

## 2022-08-29 DIAGNOSIS — N2581 Secondary hyperparathyroidism of renal origin: Secondary | ICD-10-CM | POA: Diagnosis not present

## 2022-08-29 DIAGNOSIS — Z992 Dependence on renal dialysis: Secondary | ICD-10-CM | POA: Diagnosis not present

## 2022-08-29 DIAGNOSIS — D631 Anemia in chronic kidney disease: Secondary | ICD-10-CM | POA: Diagnosis not present

## 2022-08-29 DIAGNOSIS — D688 Other specified coagulation defects: Secondary | ICD-10-CM | POA: Diagnosis not present

## 2022-08-29 DIAGNOSIS — N186 End stage renal disease: Secondary | ICD-10-CM | POA: Diagnosis not present

## 2022-08-29 DIAGNOSIS — D509 Iron deficiency anemia, unspecified: Secondary | ICD-10-CM | POA: Diagnosis not present

## 2022-09-01 DIAGNOSIS — N2581 Secondary hyperparathyroidism of renal origin: Secondary | ICD-10-CM | POA: Diagnosis not present

## 2022-09-01 DIAGNOSIS — N186 End stage renal disease: Secondary | ICD-10-CM | POA: Diagnosis not present

## 2022-09-01 DIAGNOSIS — D631 Anemia in chronic kidney disease: Secondary | ICD-10-CM | POA: Diagnosis not present

## 2022-09-01 DIAGNOSIS — Z992 Dependence on renal dialysis: Secondary | ICD-10-CM | POA: Diagnosis not present

## 2022-09-01 DIAGNOSIS — D688 Other specified coagulation defects: Secondary | ICD-10-CM | POA: Diagnosis not present

## 2022-09-01 DIAGNOSIS — D509 Iron deficiency anemia, unspecified: Secondary | ICD-10-CM | POA: Diagnosis not present

## 2022-09-02 IMAGING — DX DG ABDOMEN ACUTE W/ 1V CHEST
3 series · 3 of 3 positions shown · non-contrast
Comparison: 05/07/2017.

CLINICAL DATA: Abdominal and chest pressure.

EXAM:
DG ABDOMEN ACUTE W/ 1V CHEST

[abdomen supine]
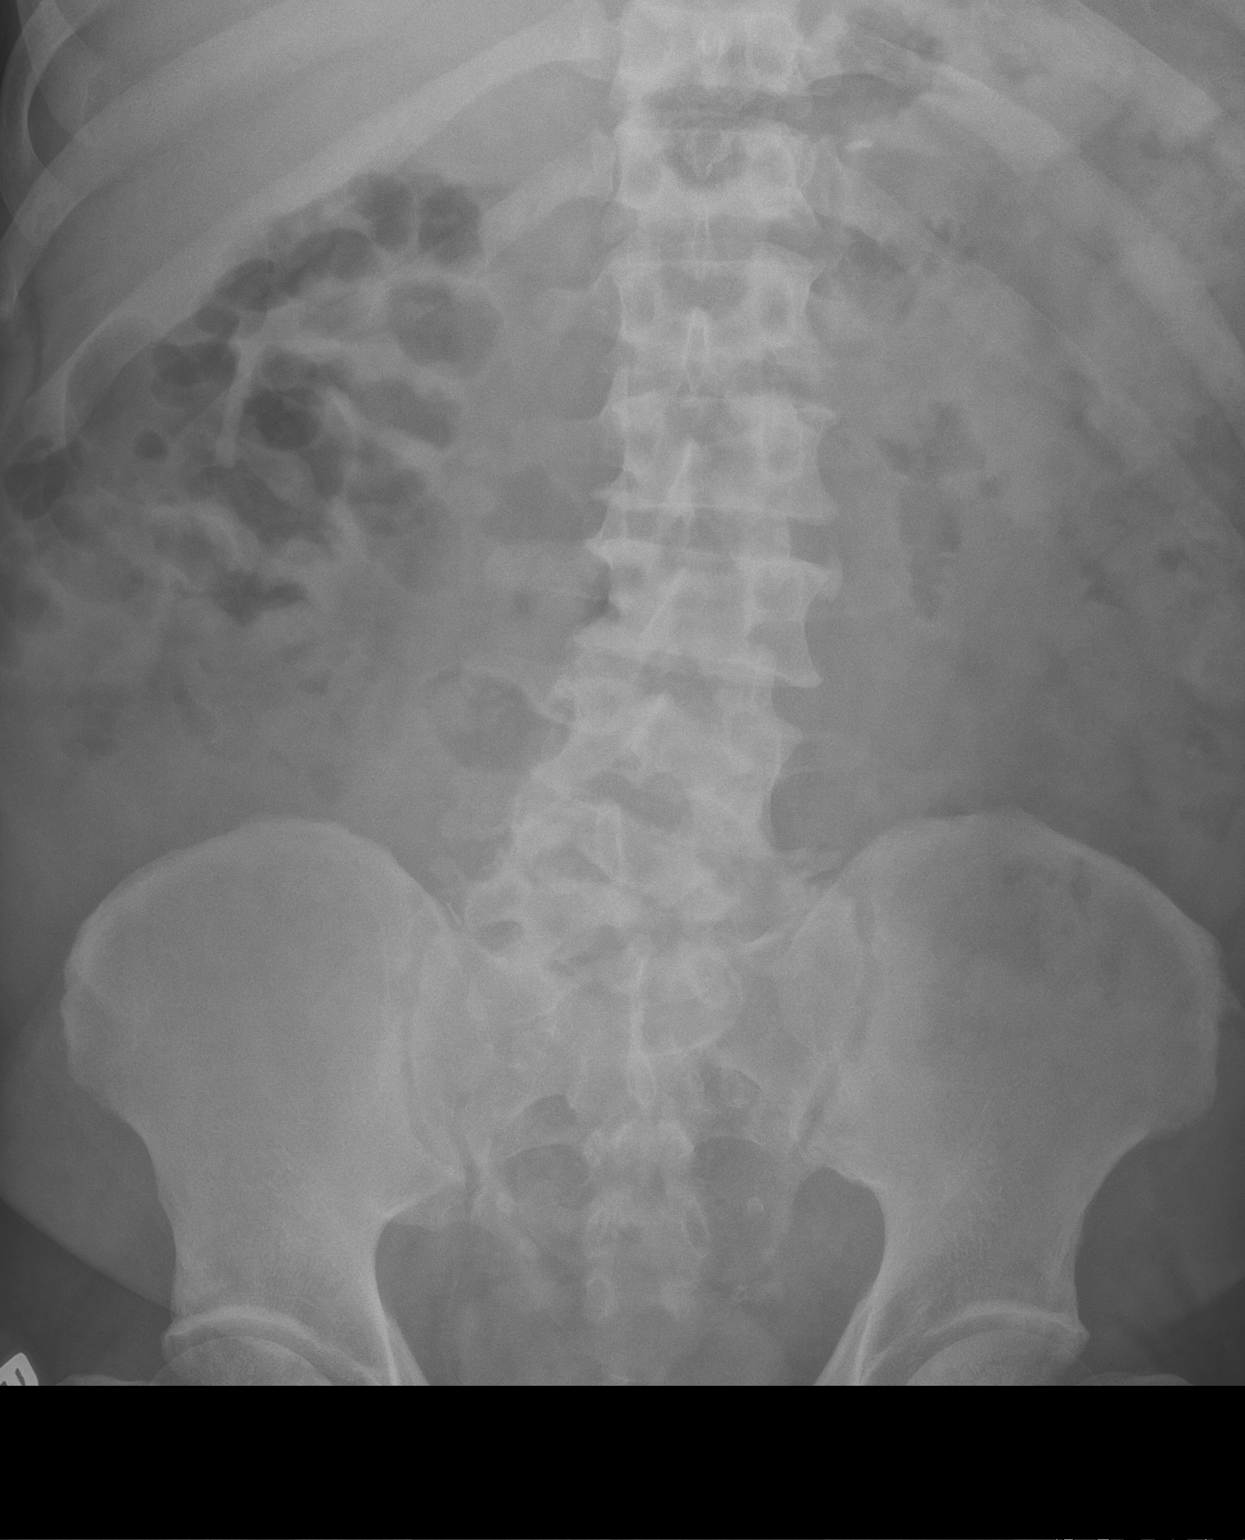

[abdomen erect]
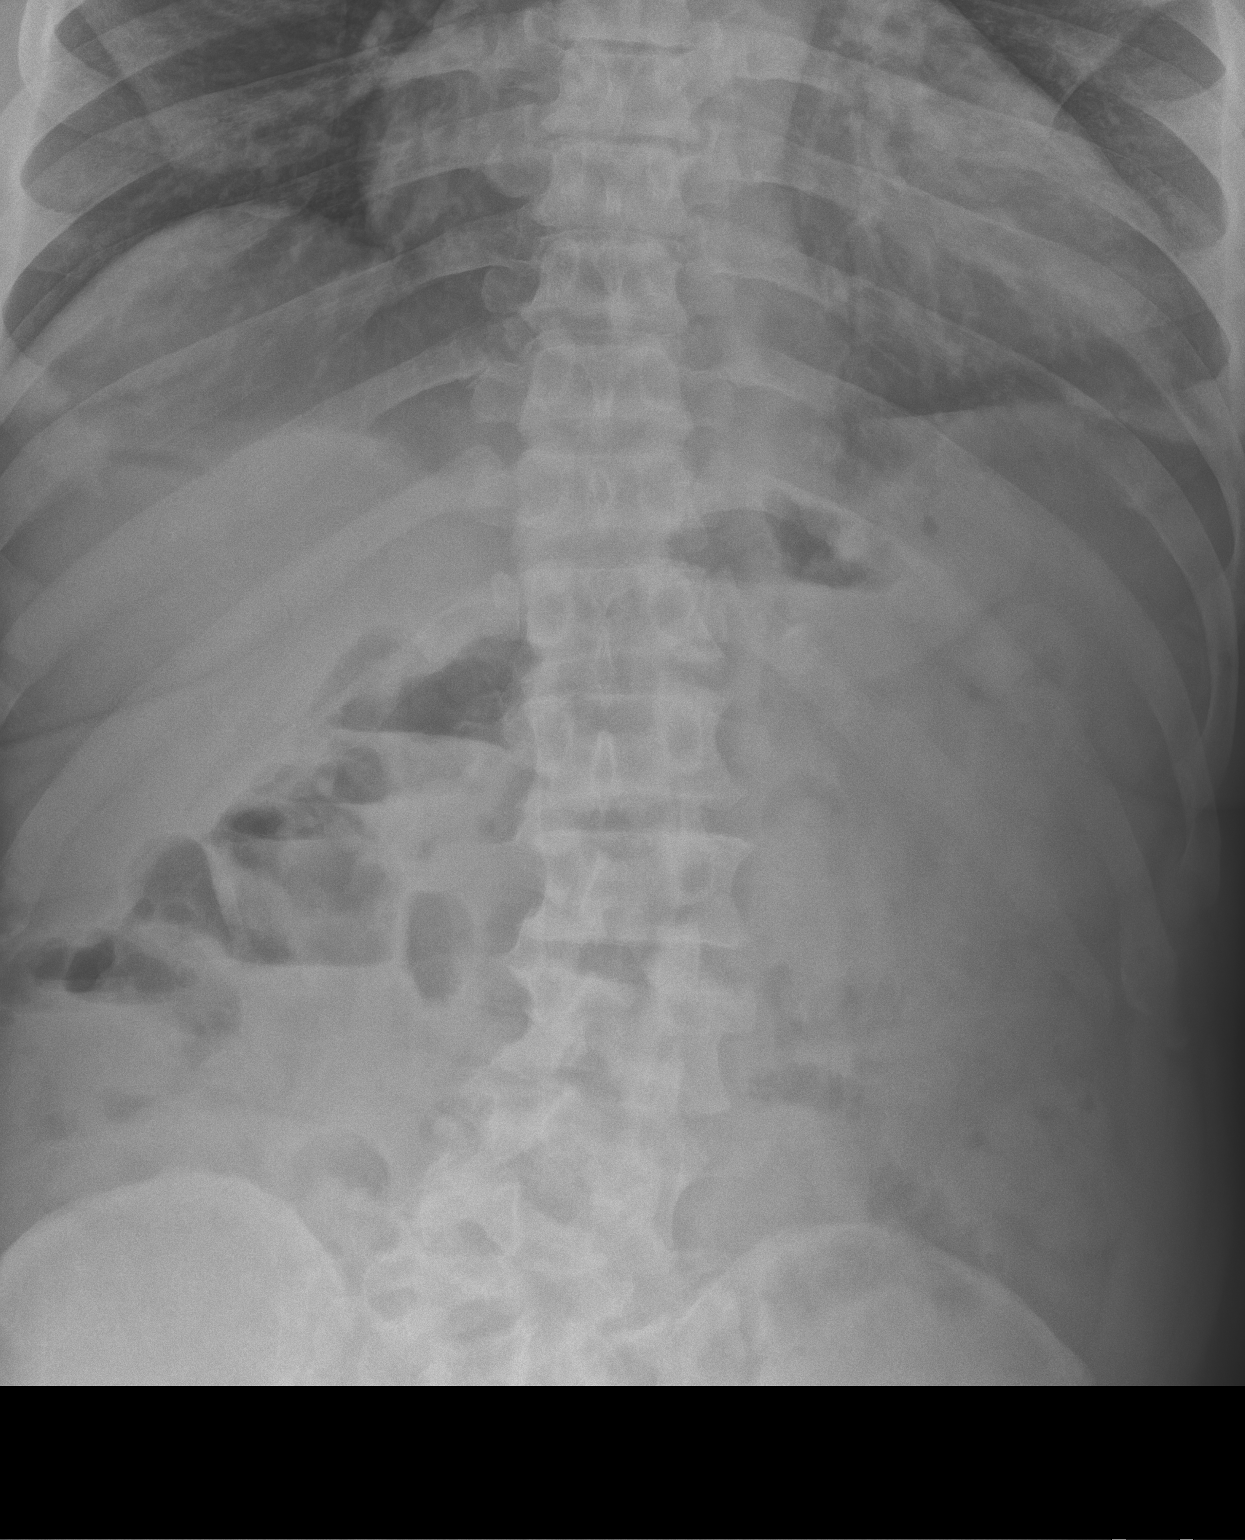

[chest pa]
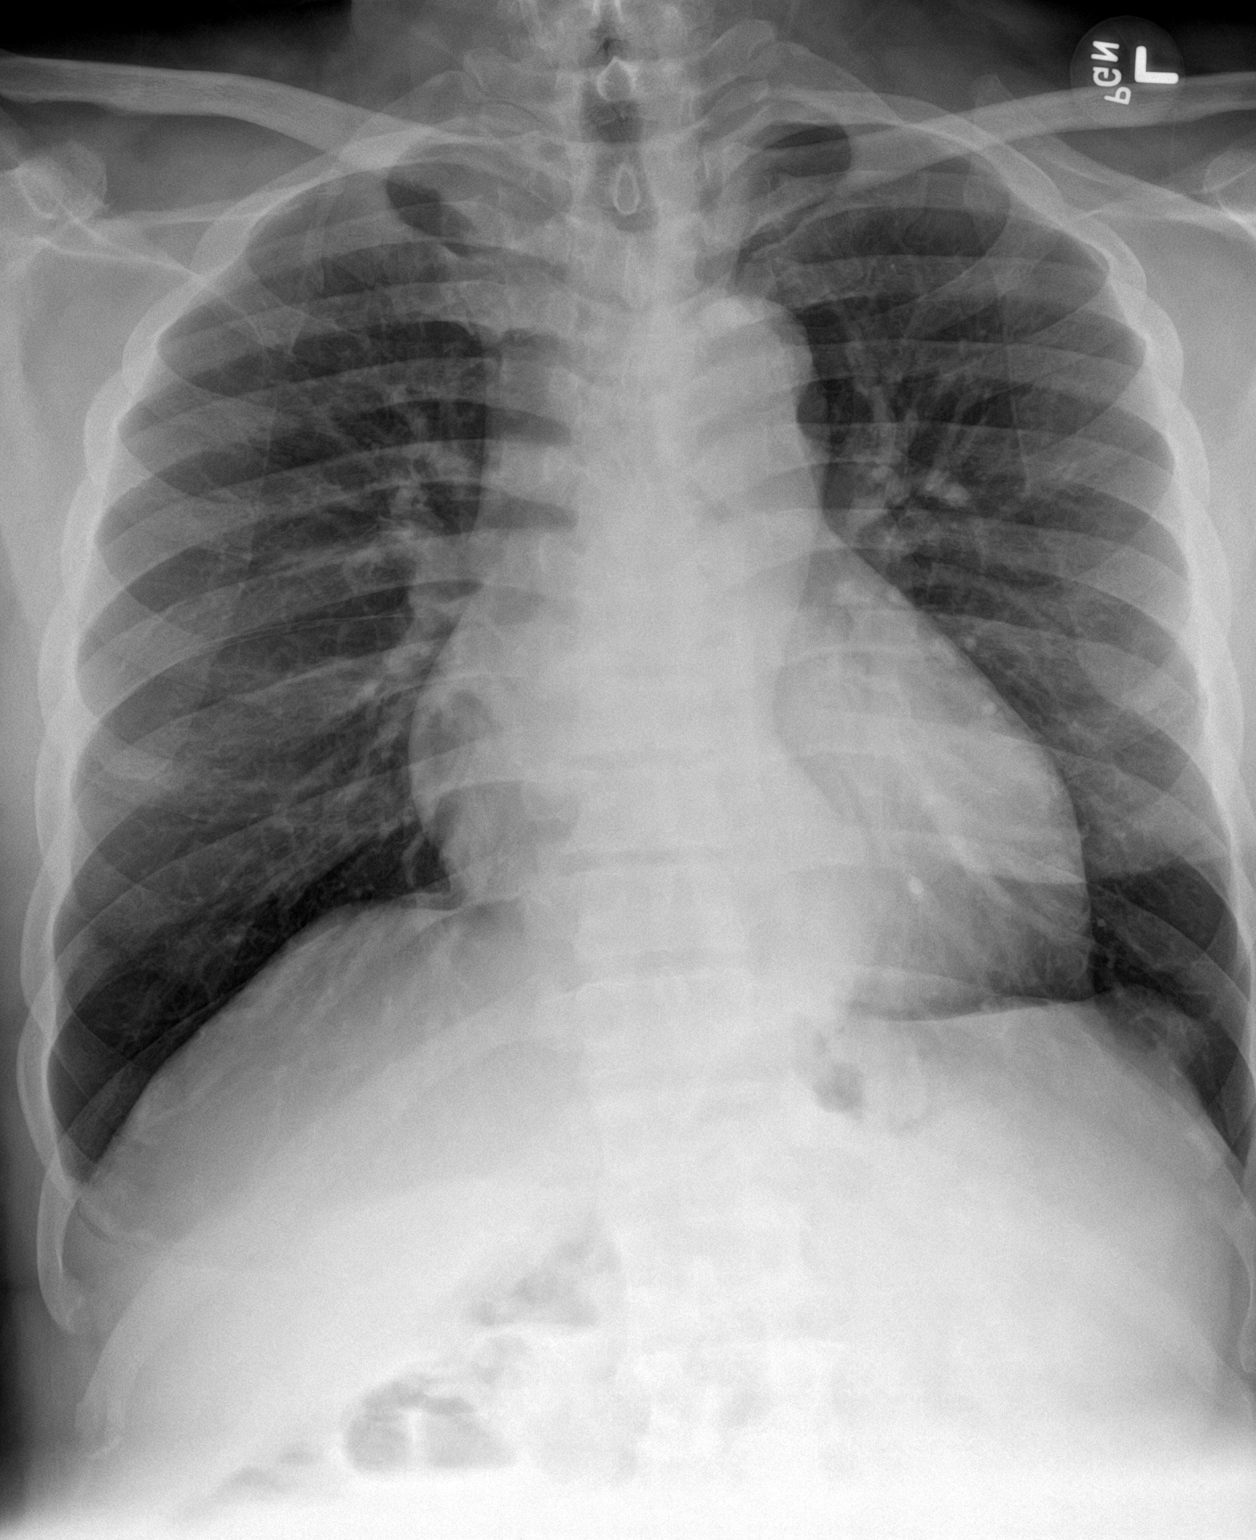

[3 of 3 positions shown; findings below may reference images not displayed]

FINDINGS: Again noted is cardiomegaly. There is no pneumothorax. No
significant pleural effusion. No focal infiltrate. The bowel gas
pattern is nonobstructive. There are no definite radiopaque kidney
stones.
IMPRESSION: 1. Cardiomegaly. No acute cardiopulmonary process.
2. Nonobstructive bowel gas pattern.

## 2022-09-05 DIAGNOSIS — D631 Anemia in chronic kidney disease: Secondary | ICD-10-CM | POA: Diagnosis not present

## 2022-09-05 DIAGNOSIS — Z992 Dependence on renal dialysis: Secondary | ICD-10-CM | POA: Diagnosis not present

## 2022-09-05 DIAGNOSIS — N186 End stage renal disease: Secondary | ICD-10-CM | POA: Diagnosis not present

## 2022-09-05 DIAGNOSIS — D688 Other specified coagulation defects: Secondary | ICD-10-CM | POA: Diagnosis not present

## 2022-09-05 DIAGNOSIS — N2581 Secondary hyperparathyroidism of renal origin: Secondary | ICD-10-CM | POA: Diagnosis not present

## 2022-09-05 DIAGNOSIS — D509 Iron deficiency anemia, unspecified: Secondary | ICD-10-CM | POA: Diagnosis not present

## 2022-09-08 DIAGNOSIS — D509 Iron deficiency anemia, unspecified: Secondary | ICD-10-CM | POA: Diagnosis not present

## 2022-09-08 DIAGNOSIS — N186 End stage renal disease: Secondary | ICD-10-CM | POA: Diagnosis not present

## 2022-09-08 DIAGNOSIS — D631 Anemia in chronic kidney disease: Secondary | ICD-10-CM | POA: Diagnosis not present

## 2022-09-08 DIAGNOSIS — Z992 Dependence on renal dialysis: Secondary | ICD-10-CM | POA: Diagnosis not present

## 2022-09-08 DIAGNOSIS — N2581 Secondary hyperparathyroidism of renal origin: Secondary | ICD-10-CM | POA: Diagnosis not present

## 2022-09-08 DIAGNOSIS — D688 Other specified coagulation defects: Secondary | ICD-10-CM | POA: Diagnosis not present

## 2022-09-10 DIAGNOSIS — Z7184 Encounter for health counseling related to travel: Secondary | ICD-10-CM | POA: Diagnosis not present

## 2022-09-10 DIAGNOSIS — D631 Anemia in chronic kidney disease: Secondary | ICD-10-CM | POA: Diagnosis not present

## 2022-09-10 DIAGNOSIS — T8610 Unspecified complication of kidney transplant: Secondary | ICD-10-CM | POA: Diagnosis not present

## 2022-09-10 DIAGNOSIS — N186 End stage renal disease: Secondary | ICD-10-CM | POA: Diagnosis not present

## 2022-09-10 DIAGNOSIS — D509 Iron deficiency anemia, unspecified: Secondary | ICD-10-CM | POA: Diagnosis not present

## 2022-09-10 DIAGNOSIS — N2581 Secondary hyperparathyroidism of renal origin: Secondary | ICD-10-CM | POA: Diagnosis not present

## 2022-09-10 DIAGNOSIS — D688 Other specified coagulation defects: Secondary | ICD-10-CM | POA: Diagnosis not present

## 2022-09-10 DIAGNOSIS — Z992 Dependence on renal dialysis: Secondary | ICD-10-CM | POA: Diagnosis not present

## 2022-10-05 ENCOUNTER — Emergency Department (HOSPITAL_COMMUNITY): Payer: Medicare Other

## 2022-10-05 ENCOUNTER — Other Ambulatory Visit: Payer: Self-pay

## 2022-10-05 ENCOUNTER — Observation Stay (HOSPITAL_COMMUNITY)
Admission: EM | Admit: 2022-10-05 | Discharge: 2022-10-06 | Disposition: A | Discharge status: Home / Self Care | Payer: Medicare Other | Attending: Obstetrics and Gynecology | Admitting: Obstetrics and Gynecology

## 2022-10-05 ENCOUNTER — Encounter (HOSPITAL_COMMUNITY): Payer: Self-pay

## 2022-10-05 DIAGNOSIS — I509 Heart failure, unspecified: Secondary | ICD-10-CM | POA: Diagnosis not present

## 2022-10-05 DIAGNOSIS — Z79899 Other long term (current) drug therapy: Secondary | ICD-10-CM | POA: Insufficient documentation

## 2022-10-05 DIAGNOSIS — I161 Hypertensive emergency: Secondary | ICD-10-CM | POA: Diagnosis not present

## 2022-10-05 DIAGNOSIS — I10 Essential (primary) hypertension: Secondary | ICD-10-CM | POA: Diagnosis present

## 2022-10-05 DIAGNOSIS — J9601 Acute respiratory failure with hypoxia: Secondary | ICD-10-CM | POA: Insufficient documentation

## 2022-10-05 DIAGNOSIS — I132 Hypertensive heart and chronic kidney disease with heart failure and with stage 5 chronic kidney disease, or end stage renal disease: Secondary | ICD-10-CM | POA: Diagnosis not present

## 2022-10-05 DIAGNOSIS — R531 Weakness: Secondary | ICD-10-CM | POA: Diagnosis not present

## 2022-10-05 DIAGNOSIS — Z992 Dependence on renal dialysis: Secondary | ICD-10-CM | POA: Insufficient documentation

## 2022-10-05 DIAGNOSIS — J810 Acute pulmonary edema: Secondary | ICD-10-CM | POA: Insufficient documentation

## 2022-10-05 DIAGNOSIS — R059 Cough, unspecified: Secondary | ICD-10-CM | POA: Diagnosis not present

## 2022-10-05 DIAGNOSIS — N186 End stage renal disease: Secondary | ICD-10-CM | POA: Diagnosis not present

## 2022-10-05 DIAGNOSIS — E1122 Type 2 diabetes mellitus with diabetic chronic kidney disease: Secondary | ICD-10-CM | POA: Insufficient documentation

## 2022-10-05 DIAGNOSIS — R778 Other specified abnormalities of plasma proteins: Secondary | ICD-10-CM | POA: Insufficient documentation

## 2022-10-05 DIAGNOSIS — N179 Acute kidney failure, unspecified: Secondary | ICD-10-CM | POA: Diagnosis not present

## 2022-10-05 DIAGNOSIS — I5033 Acute on chronic diastolic (congestive) heart failure: Secondary | ICD-10-CM | POA: Diagnosis not present

## 2022-10-05 DIAGNOSIS — U071 COVID-19: Secondary | ICD-10-CM | POA: Diagnosis not present

## 2022-10-05 DIAGNOSIS — I120 Hypertensive chronic kidney disease with stage 5 chronic kidney disease or end stage renal disease: Secondary | ICD-10-CM | POA: Diagnosis not present

## 2022-10-05 DIAGNOSIS — I5032 Chronic diastolic (congestive) heart failure: Secondary | ICD-10-CM | POA: Insufficient documentation

## 2022-10-05 DIAGNOSIS — R0602 Shortness of breath: Secondary | ICD-10-CM | POA: Diagnosis present

## 2022-10-05 LAB — BASIC METABOLIC PANEL
Anion gap: 30 — ABNORMAL HIGH (ref 5–15)
BUN: 143 mg/dL — ABNORMAL HIGH (ref 8–23)
CO2: 14 mmol/L — ABNORMAL LOW (ref 22–32)
Calcium: 9.4 mg/dL (ref 8.9–10.3)
Chloride: 93 mmol/L — ABNORMAL LOW (ref 98–111)
Creatinine, Ser: 29.93 mg/dL — ABNORMAL HIGH (ref 0.61–1.24)
GFR, Estimated: 1 mL/min — ABNORMAL LOW (ref >60)
Glucose, Bld: 89 mg/dL (ref 70–99)
Potassium: 5.4 mmol/L — ABNORMAL HIGH (ref 3.5–5.1)
Sodium: 137 mmol/L (ref 135–145)

## 2022-10-05 LAB — FIBRINOGEN: Fibrinogen: 664 mg/dL — ABNORMAL HIGH (ref 210–475)

## 2022-10-05 LAB — CBC
HCT: 36.7 % — ABNORMAL LOW (ref 39.0–52.0)
Hemoglobin: 12 g/dL — ABNORMAL LOW (ref 13.0–17.0)
MCH: 27.6 pg (ref 26.0–34.0)
MCHC: 32.7 g/dL (ref 30.0–36.0)
MCV: 84.6 fL (ref 80.0–100.0)
Platelets: 182 10*3/uL (ref 150–400)
RBC: 4.34 MIL/uL (ref 4.22–5.81)
RDW: 15 % (ref 11.5–15.5)
WBC: 7.1 10*3/uL (ref 4.0–10.5)
nRBC: 0 % (ref 0.0–0.2)

## 2022-10-05 LAB — TROPONIN I (HIGH SENSITIVITY)
Troponin I (High Sensitivity): 73 ng/L — ABNORMAL HIGH (ref <18)
Troponin I (High Sensitivity): 103 ng/L (ref <18)

## 2022-10-05 LAB — FERRITIN: Ferritin: 1449 ng/mL — ABNORMAL HIGH (ref 24–336)

## 2022-10-05 LAB — LACTATE DEHYDROGENASE: LDH: 247 U/L — ABNORMAL HIGH (ref 98–192)

## 2022-10-05 LAB — D-DIMER, QUANTITATIVE: D-Dimer, Quant: 2.82 ug/mL-FEU — ABNORMAL HIGH (ref 0.00–0.50)

## 2022-10-05 LAB — RESP PANEL BY RT-PCR (RSV, FLU A&B, COVID)  RVPGX2
Influenza A by PCR: NEGATIVE
Influenza B by PCR: NEGATIVE
Resp Syncytial Virus by PCR: NEGATIVE
SARS Coronavirus 2 by RT PCR: POSITIVE — AB

## 2022-10-05 LAB — HIV ANTIBODY (ROUTINE TESTING W REFLEX): HIV Screen 4th Generation wRfx: NONREACTIVE

## 2022-10-05 LAB — HEPATITIS B SURFACE ANTIGEN: Hepatitis B Surface Ag: NONREACTIVE

## 2022-10-05 LAB — GLUCOSE, CAPILLARY: Glucose-Capillary: 122 mg/dL — ABNORMAL HIGH (ref 70–99)

## 2022-10-05 LAB — PROCALCITONIN: Procalcitonin: 0.38 ng/mL

## 2022-10-05 LAB — MRSA NEXT GEN BY PCR, NASAL: MRSA by PCR Next Gen: NOT DETECTED

## 2022-10-05 MED ORDER — SIMVASTATIN 20 MG PO TABS: 10.0000 mg | ORAL_TABLET | Freq: Every day | ORAL | Status: DC

## 2022-10-05 MED ORDER — CHLORHEXIDINE GLUCONATE CLOTH 2 % EX PADS
6.0000 | MEDICATED_PAD | Freq: Every day | CUTANEOUS | Status: DC
  Administered 2022-10-06: 6 via TOPICAL

## 2022-10-05 MED ORDER — GUAIFENESIN-DM 100-10 MG/5ML PO SYRP: 10.0000 mL | ORAL_SOLUTION | ORAL | Status: DC | PRN

## 2022-10-05 MED ORDER — AMLODIPINE BESYLATE 5 MG PO TABS: 5.0000 mg | ORAL_TABLET | Freq: Every day | ORAL | Status: DC

## 2022-10-05 MED ORDER — HYDRALAZINE HCL 50 MG PO TABS
50.0000 mg | ORAL_TABLET | Freq: Three times a day (TID) | ORAL | Status: DC
  Administered 2022-10-05 (×2): 50 mg via ORAL
  Filled 2022-10-05 (×2): qty 1

## 2022-10-05 MED ORDER — CARVEDILOL 12.5 MG PO TABS: 25.0000 mg | ORAL_TABLET | Freq: Two times a day (BID) | ORAL | Status: DC

## 2022-10-05 MED ORDER — INSULIN ASPART 100 UNIT/ML IJ SOLN: 0.0000 [IU] | Freq: Three times a day (TID) | INTRAMUSCULAR | Status: DC

## 2022-10-05 MED ORDER — ISOSORBIDE MONONITRATE ER 30 MG PO TB24
30.0000 mg | ORAL_TABLET | Freq: Every day | ORAL | Status: DC
  Administered 2022-10-05: 30 mg via ORAL
  Filled 2022-10-05 (×2): qty 1

## 2022-10-05 MED ORDER — IPRATROPIUM-ALBUTEROL 20-100 MCG/ACT IN AERS
1.0000 | INHALATION_SPRAY | Freq: Two times a day (BID) | RESPIRATORY_TRACT | Status: DC
  Filled 2022-10-05: qty 4

## 2022-10-05 MED ORDER — IPRATROPIUM-ALBUTEROL 20-100 MCG/ACT IN AERS
1.0000 | INHALATION_SPRAY | Freq: Four times a day (QID) | RESPIRATORY_TRACT | Status: DC
  Filled 2022-10-05: qty 4

## 2022-10-05 MED ORDER — LABETALOL HCL 5 MG/ML IV SOLN
10.0000 mg | Freq: Once | INTRAVENOUS | Status: AC
  Administered 2022-10-05: 10 mg via INTRAVENOUS
  Filled 2022-10-05: qty 4

## 2022-10-05 MED ORDER — DEXAMETHASONE 6 MG PO TABS
6.0000 mg | ORAL_TABLET | ORAL | Status: DC
  Administered 2022-10-05: 6 mg via ORAL
  Filled 2022-10-05: qty 2
  Filled 2022-10-05: qty 1

## 2022-10-05 MED ORDER — AMLODIPINE BESYLATE 5 MG PO TABS
5.0000 mg | ORAL_TABLET | Freq: Every day | ORAL | Status: DC
  Filled 2022-10-05: qty 1

## 2022-10-05 MED ORDER — SODIUM CHLORIDE 0.9 % IV SOLN: 250.0000 mL | INTRAVENOUS | Status: DC | PRN

## 2022-10-05 MED ORDER — NIRMATRELVIR/RITONAVIR (PAXLOVID) TABLET (RENAL DOSING): 2.0000 | ORAL_TABLET | Freq: Two times a day (BID) | ORAL | Status: DC

## 2022-10-05 MED ORDER — HEPARIN SODIUM (PORCINE) 5000 UNIT/ML IJ SOLN
5000.0000 [IU] | Freq: Two times a day (BID) | INTRAMUSCULAR | Status: DC
  Administered 2022-10-05 (×2): 5000 [IU] via SUBCUTANEOUS
  Filled 2022-10-05 (×3): qty 1

## 2022-10-05 MED ORDER — ACETAMINOPHEN 500 MG PO TABS: 1000.0000 mg | ORAL_TABLET | Freq: Three times a day (TID) | ORAL | Status: DC | PRN

## 2022-10-05 MED ORDER — CHLORHEXIDINE GLUCONATE CLOTH 2 % EX PADS: 6.0000 | MEDICATED_PAD | Freq: Every day | CUTANEOUS | Status: DC

## 2022-10-05 MED ORDER — EZETIMIBE-SIMVASTATIN 10-10 MG PO TABS: 1.0000 | ORAL_TABLET | Freq: Every day | ORAL | Status: DC

## 2022-10-05 MED ORDER — CARVEDILOL 25 MG PO TABS
25.0000 mg | ORAL_TABLET | Freq: Two times a day (BID) | ORAL | Status: DC
  Administered 2022-10-05: 25 mg via ORAL
  Filled 2022-10-05 (×2): qty 1

## 2022-10-05 MED ORDER — LOSARTAN POTASSIUM 25 MG PO TABS
25.0000 mg | ORAL_TABLET | Freq: Every day | ORAL | Status: DC
  Filled 2022-10-05: qty 1

## 2022-10-05 MED ORDER — SODIUM CHLORIDE 0.9% FLUSH: 3.0000 mL | INTRAVENOUS | Status: DC | PRN

## 2022-10-05 MED ORDER — ACETAMINOPHEN 325 MG PO TABS
650.0000 mg | ORAL_TABLET | ORAL | Status: DC | PRN
  Administered 2022-10-05: 650 mg via ORAL
  Filled 2022-10-05: qty 2

## 2022-10-05 MED ORDER — SEVELAMER CARBONATE 800 MG PO TABS
1600.0000 mg | ORAL_TABLET | Freq: Three times a day (TID) | ORAL | Status: DC
  Administered 2022-10-05: 1600 mg via ORAL
  Filled 2022-10-05 (×2): qty 2

## 2022-10-05 MED ORDER — CARVEDILOL 12.5 MG PO TABS
12.5000 mg | ORAL_TABLET | Freq: Once | ORAL | Status: DC
  Filled 2022-10-05: qty 1

## 2022-10-05 MED ORDER — SODIUM CHLORIDE 0.9% FLUSH
3.0000 mL | Freq: Two times a day (BID) | INTRAVENOUS | Status: DC
  Administered 2022-10-05 (×2): 3 mL via INTRAVENOUS

## 2022-10-05 MED ORDER — HEPARIN SODIUM (PORCINE) 1000 UNIT/ML DIALYSIS
4000.0000 [IU] | Freq: Once | INTRAMUSCULAR | Status: DC
  Filled 2022-10-05: qty 4

## 2022-10-05 MED ORDER — ONDANSETRON HCL 4 MG/2ML IJ SOLN: 4.0000 mg | Freq: Four times a day (QID) | INTRAMUSCULAR | Status: DC | PRN

## 2022-10-05 MED ORDER — HYDRALAZINE HCL 20 MG/ML IJ SOLN: 5.0000 mg | Freq: Four times a day (QID) | INTRAMUSCULAR | Status: DC | PRN

## 2022-10-05 MED ORDER — EZETIMIBE 10 MG PO TABS: 10.0000 mg | ORAL_TABLET | Freq: Every day | ORAL | Status: DC

## 2022-10-05 NOTE — ED Notes (Signed)
Unsuccessful IV attempt x2.  Will consult IV team.  °

## 2022-10-05 NOTE — Consult Note (Addendum)
Renal Service Consult Note [facility]  [name] 10/05/2022 Sol Blazing, MD Requesting Physician: Dr. Roosevelt Locks  Reason for Consult: ESRD pt w/ missed HD HPI: The patient is a 72 y.o. year-old w/ PMH as below who presented to ED for cough and SOB. Pt is on HD at [facility] in [place] on MWF schedule. Pt went on a trip to [place] (last HD here was 1/31) and had about 4 HD sessions there while overseas. Got back to [place] 3 days ago, last HD was 8 days ago on 2/17 in [place]. Two days ago pt developed a dry cough and worsening DOE, no fever or chills, also nausea and vomiting. No diarrhea. Pt is for admission, we are asked to see for ESRD.    Pt seen in room. Primary c/o's are as above. Says his wife made him come to ED.  Labs showed K+ 5.4, BUN 143  creat 29, Hb 12, wbc 7K.  Has R arm AVG.   ROS - denies CP, no joint pain, no HA, no blurry vision, no rash, no diarrhea, no nausea/ vomiting, no dysuria, no difficulty voiding   Past Medical History  Past Medical History:  Diagnosis Date   Anemia    Anemia    Benign colon polyp 08/11/2010   Clot    STATED TOLD IN APRIL CLOT TO LEFT SHOULDER. 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   HTN (hypertension)    Hyperlipidemia    Renal failure    Past Surgical History  Past Surgical History:  Procedure Laterality Date   AV FISTULA PLACEMENT  01/19/2012   Procedure: ARTERIOVENOUS (AV) FISTULA CREATION;  Surgeon: Conrad Lindsay, MD;  Location: [place];  Service: Vascular;  Laterality: Right;  Right Brachio-cephalic arteriovenous fistula   AV FISTULA PLACEMENT Right 07/18/2021   Procedure: INSERTION OF RIGHT UPPER EXTREMITY ARTERIOVENOUS (AV) GORE-TEX GRAFT;  Surgeon: [name], MD;  Location: [place];  Service: Vascular;  Laterality: Right;  PERIPHERAL NERVE BLOCK   AV FISTULA PLACEMENT,  RADIOCEPHALIC  123XX123   Right arm   INSERTION OF DIALYSIS CATHETER  01/19/2012   Procedure: INSERTION OF DIALYSIS CATHETER;  Surgeon: Conrad Bethesda, MD;  Location: [place];  Service: Vascular;  Laterality: N/A;  right internal jugular vein   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   UPPER EXTREMITY VENOGRAPHY Right 06/11/2021   Procedure: UPPER EXTREMITY VENOGRAPHY;  Surgeon: Serafina Mitchell, MD;  Location: [place] [facility];  Service: Cardiovascular;  Laterality: Right;   VENOGRAM Left 05/30/2021   Procedure: DIAGNOSTIC CENTRAL VENOGRAM;  Surgeon: Broadus John, MD;  Location: [facility] OR;  Service: Vascular;  Laterality: Left;   Family History  Family History  Problem Relation Age of Onset   Diabetes Father    Colon cancer Neg Hx    Esophageal cancer Neg Hx    Stomach cancer Neg Hx    Rectal cancer Neg Hx    Social History  reports that he has never smoked. He has never used smokeless tobacco. He reports that he does not drink alcohol and does not use drugs. Allergies No Known Allergies Home medications Prior to Admission medications   Medication Sig Start Date End Date Taking? Authorizing Provider  ACCU-CHEK SOFTCLIX LANCETS lancets 1 each by Other route 2 (two) times daily. Use to check blood sugars  twice a day 03/04/17   Janith Lima, MD  acetaminophen (TYLENOL) 500 MG tablet Take 1,000 mg by mouth every 8 (eight) hours as needed for moderate pain.    [provider]  amLODipine (NORVASC) 5 MG tablet Take 5 mg by mouth daily.    [provider]  Blood Glucose Monitoring Suppl (ACCU-CHEK AVIVA PLUS) w/Device KIT Use as directed to check blood sugars 03/04/17   Janith Lima, MD  carvedilol (COREG) 25 MG tablet Take 25 mg by mouth 2 (two) times daily with a meal.    [provider]  ezetimibe-simvastatin (VYTORIN) 10-10 MG tablet Take 1 tablet by mouth at bedtime. 12/27/21   Janith Lima, MD  glucose blood test strip Use TID 02/25/17   Janith Lima, MD   losartan (COZAAR) 25 MG tablet Take 25 mg by mouth in the morning and at bedtime. 12/10/20   [provider]  sevelamer carbonate (RENVELA) 800 MG tablet Take 1,600 mg by mouth 3 (three) times daily with meals. 04/01/21   [provider]  sildenafil (REVATIO) 20 MG tablet Take 1 tablet (20 mg total) by mouth daily. 12/26/21   Janith Lima, MD     Vitals:   10/05/22 1100 10/05/22 1140 10/05/22 1200 10/05/22 1230  BP: (!) 200/100 (!) 204/100 (!) 184/83 (!) 200/93  Pulse:  83 83 84  Resp: '17 18 17 20  '$ Temp:      TempSrc:      SpO2:  95% 95% 95%  Weight:      Height:       Exam Gen alert, no distress, on 2L [place] No rash, cyanosis or gangrene Sclera anicteric, throat clear  No jvd or bruits Chest bilat scattered mild rhonchi, no rales/ wheezing RRR no MRG Abd soft ntnd no mass or ascites +bs GU normal male MS no joint effusions or deformity Ext no LE or UE edema, no wounds or ulcers Neuro is alert, Ox 3 , nf    RUA AVG +bruit      Home meds include - norvasc 5, coreg 25 bid, ezetimibe-simvastatin, losartan 25 qd, renvela 2 ac tid, sildenafil 20 qd, prns/ vits/ supps      OP HD: MWF G-O 4h  400/800   78.5kg  2/2 bath RUA AVG   Heparin 4000 - last HD was 1/31 post wt 78.3kg - rocaltrol 2.0 mcg po tiw - sensipar 150 mg po tiw, last pth 1061 on 08/13/22 - last Hb on 1/31 was 13.0   CXR 2/25 - IMPRESSION: asymmetric airspace disease is seen in both lower lobes, right side greater than left. This may be due to pneumonia or asymmetric edema.     Na 137  K + 5.4  BUN 143  CO2  14  creat 29.9  AG 30  Hb 12  wBC 7K     COVID +  Assessment/ Plan: Acute hypoxic resp failure - could be due to COVID and/or vol overload. Doesn't looks vol overloaded on exam but has missed 8 days of dialysis. And BP's are quite high. Will plan HD later this evening.  Volume - as above, needs body wt ESRD - on HD MWF. Has been overseas in [place], last HD in [place] 1/31. Last HD was 2/17 in  [place]. Pt quite azotemic but tolerating it well, prob has early mild uremia w/ N/V. Will need gradual lowering of solute over next several days. Plan HD tonight w/ bfr 250, dfr 250, 2h 81mn.  Volume - get standing wt prior to HD. CXR could be fluid or infection.  HTN - would just resume his home meds including norvasc, coreg and losartan. Get vol down w/ HD tonight Anemia esrd - Hb 12, no esa needs MBD ckd - Ca in range, add on phos. Get pth. Will resume sensipar and vdra as they were in late Jan here.   Kelly Splinter  MD [facility] 10/05/2022, 1:51 PM  Recent Labs  Lab 10/05/22 0922  HGB 12.0*  CALCIUM 9.4  CREATININE 29.93*  K 5.4*   Inpatient medications:  carvedilol  25 mg Oral BID WC   dexamethasone  6 mg Oral Q24H   heparin  5,000 Units Subcutaneous Q12H   insulin aspart  0-6 Units Subcutaneous TID WC   Ipratropium-Albuterol  1 puff Inhalation Q6H   sevelamer carbonate  1,600 mg Oral TID WC   sodium chloride flush  3 mL Intravenous Q12H    sodium chloride     sodium chloride, acetaminophen, guaiFENesin-dextromethorphan, hydrALAZINE, ondansetron (ZOFRAN) IV, sodium chloride flush

## 2022-10-05 NOTE — H&P (Addendum)
History and Physical    [name] [id number] DOB: Jun 02, 1951 DOA: 10/05/2022  PCP: Janith Lima, MD (Confirm with patient/family/NH records and if not entered, this has to be entered at [facility] point of entry) Patient coming from: Home  I have personally briefly reviewed patient's old medical records in [place]  Chief Complaint: Cough, shortness of breath  HPI: [name] is a 72 y.o. male with medical history significant of ESRD on HD MWF, chronic HFpEF, IIDM, HTN, HLD, came with worsening of cough and shortness of breath.  Underwent a trip recently to [place], and received 4 HD during 2 weeks trip and came back 3 days ago.  Last HD was last Saturday while he was seen [place].  2 days ago, patient started develop new dry cough and increasing exertional dyspnea denies any chest pain, no fever or chills.  No loss of taste no diarrhea.  Last night, patient developed generalized weakness, orthopnea and decided to come in to the hospital this morning.  ED Course: Blood pressure significant elevated SBP> 200, O2 saturation 88% on room air.  CT chest showed pulmonary edema.  Lab work showed overt uremia BUN 143, creatinine 29, hemoglobin 12, WBC 7.1, bicarb 14, K5.4.  Nephrology consulted and arranging for emergency dialysis this afternoon.  Review of Systems: As per HPI otherwise 14 point review of systems negative.    Past Medical History:  Diagnosis Date   Anemia    Anemia    Benign colon polyp 08/11/2010   Clot    STATED TOLD IN APRIL CLOT TO LEFT SHOULDER. 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   HTN (hypertension)    Hyperlipidemia    Renal failure     Past Surgical History:  Procedure Laterality Date   AV FISTULA PLACEMENT  01/19/2012   Procedure: ARTERIOVENOUS (AV) FISTULA CREATION;  Surgeon: Conrad [place], MD;  Location:  [place];  Service: Vascular;  Laterality: Right;  Right Brachio-cephalic arteriovenous fistula   AV FISTULA PLACEMENT Right 07/18/2021   Procedure: INSERTION OF RIGHT UPPER EXTREMITY ARTERIOVENOUS (AV) GORE-TEX GRAFT;  Surgeon: [name], MD;  Location: [place];  Service: Vascular;  Laterality: Right;  PERIPHERAL NERVE BLOCK   AV FISTULA PLACEMENT, RADIOCEPHALIC  [id number]   Right arm   INSERTION OF DIALYSIS CATHETER  01/19/2012   Procedure: INSERTION OF DIALYSIS CATHETER;  Surgeon: Conrad Barnum, MD;  Location: [place];  Service: Vascular;  Laterality: N/A;  right internal jugular vein   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   UPPER EXTREMITY VENOGRAPHY Right 06/11/2021   Procedure: UPPER EXTREMITY VENOGRAPHY;  Surgeon: Serafina Mitchell, MD;  Location: [place] [facility];  Service: Cardiovascular;  Laterality: Right;   VENOGRAM Left 05/30/2021   Procedure: DIAGNOSTIC CENTRAL VENOGRAM;  Surgeon: Broadus John, MD;  Location: [place];  Service: Vascular;  Laterality: Left;     reports that he has never smoked. He has never used smokeless tobacco. He reports that he does not drink alcohol and does not use drugs.  No Known Allergies  Family History  Problem Relation Age of Onset   Diabetes Father    Colon cancer Neg Hx    Esophageal cancer Neg Hx    Stomach cancer Neg Hx    Rectal cancer Neg Hx     Prior to  Admission medications   Medication Sig Start Date End Date Taking? Authorizing Provider  ACCU-CHEK SOFTCLIX LANCETS lancets 1 each by Other route 2 (two) times daily. Use to check blood sugars twice a day 03/04/17   Janith Lima, MD  acetaminophen (TYLENOL) 500 MG tablet Take 1,000 mg by mouth every 8 (eight) hours as needed for moderate pain.    [provider]  amLODipine (NORVASC) 5 MG tablet Take 5 mg by mouth daily.    [provider]  Blood Glucose Monitoring Suppl (ACCU-CHEK AVIVA PLUS) w/Device KIT Use as directed to check blood sugars 03/04/17   Janith Lima,  MD  carvedilol (COREG) 25 MG tablet Take 25 mg by mouth 2 (two) times daily with a meal.    [provider]  ezetimibe-simvastatin (VYTORIN) 10-10 MG tablet Take 1 tablet by mouth at bedtime. 12/27/21   Janith Lima, MD  glucose blood test strip Use TID 02/25/17   Janith Lima, MD  losartan (COZAAR) 25 MG tablet Take 25 mg by mouth in the morning and at bedtime. 12/10/20   [provider]  sevelamer carbonate (RENVELA) 800 MG tablet Take 1,600 mg by mouth 3 (three) times daily with meals. 04/01/21   [provider]  sildenafil (REVATIO) 20 MG tablet Take 1 tablet (20 mg total) by mouth daily. 12/26/21   Janith Lima, MD    Physical Exam: Vitals:   10/05/22 1005 10/05/22 1030 10/05/22 1100 10/05/22 1140  BP: (!) 203/96 (!) 204/102 (!) 200/100 (!) 204/100  Pulse: 80 78  83  Resp: '14 15 17 18  '$ Temp:      TempSrc:      SpO2: 96% 96%  95%  Weight:      Height:        Constitutional: NAD, calm, comfortable Vitals:   10/05/22 1005 10/05/22 1030 10/05/22 1100 10/05/22 1140  BP: (!) 203/96 (!) 204/102 (!) 200/100 (!) 204/100  Pulse: 80 78  83  Resp: '14 15 17 18  '$ Temp:      TempSrc:      SpO2: 96% 96%  95%  Weight:      Height:       Eyes: PERRL, lids and conjunctivae normal ENMT: Mucous membranes are moist. Posterior pharynx clear of any exudate or lesions.Normal dentition.  Neck: normal, supple, no masses, no thyromegaly. JVD to 6 cm above clavicle Respiratory: clear to auscultation bilaterally, no wheezing, diffuse crackles bilaterally to the mid field, increasing breathing effort. No accessory muscle use.  Cardiovascular: Regular rate and rhythm, no murmurs / rubs / gallops. 2+extremity edema. 2+ pedal pulses. No carotid bruits.  Abdomen: no tenderness, no masses palpated. No hepatosplenomegaly. Bowel sounds positive.  Musculoskeletal: no clubbing / cyanosis. No joint deformity upper and lower extremities. Good ROM, no contractures. Normal muscle tone.   Skin: no rashes, lesions, ulcers. No induration Neurologic: CN 2-12 grossly intact. Sensation intact, DTR normal. Strength 5/5 in all 4.  Psychiatric: Normal judgment and insight. Alert and oriented x 3. Normal mood.     Labs on Admission: I have personally reviewed following labs and imaging studies  CBC: Recent Labs  Lab 10/05/22 0922  WBC 7.1  HGB 12.0*  HCT 36.7*  MCV 84.6  PLT [id number]   Basic Metabolic Panel: Recent Labs  Lab 10/05/22 0922  NA 137  K 5.4*  CL 93*  CO2 14*  GLUCOSE 89  BUN 143*  CREATININE 29.93*  CALCIUM 9.4   GFR: Estimated Creatinine  Clearance: 2.3 mL/min (A) (by C-G formula based on SCr of 29.93 mg/dL (H)). Liver Function Tests: No results for input(s): "AST", "ALT", "ALKPHOS", "BILITOT", "PROT", "ALBUMIN" in the last 168 hours. No results for input(s): "LIPASE", "AMYLASE" in the last 168 hours. No results for input(s): "AMMONIA" in the last 168 hours. Coagulation Profile: No results for input(s): "INR", "PROTIME" in the last 168 hours. Cardiac Enzymes: No results for input(s): "CKTOTAL", "CKMB", "CKMBINDEX", "TROPONINI" in the last 168 hours. BNP (last 3 results) No results for input(s): "PROBNP" in the last 8760 hours. HbA1C: No results for input(s): "HGBA1C" in the last 72 hours. CBG: No results for input(s): "GLUCAP" in the last 168 hours. Lipid Profile: No results for input(s): "CHOL", "HDL", "LDLCALC", "TRIG", "CHOLHDL", "LDLDIRECT" in the last 72 hours. Thyroid Function Tests: No results for input(s): "TSH", "T4TOTAL", "FREET4", "T3FREE", "THYROIDAB" in the last 72 hours. Anemia Panel: No results for input(s): "VITAMINB12", "FOLATE", "FERRITIN", "TIBC", "IRON", "RETICCTPCT" in the last 72 hours. Urine analysis:    Component Value Date/Time   COLORURINE YELLOW 09/16/2020 1256   APPEARANCEUR CLOUDY (A) 09/16/2020 1256   LABSPEC 1.011 09/16/2020 1256   PHURINE 6.0 09/16/2020 1256   GLUCOSEU NEGATIVE 09/16/2020 1256   GLUCOSEU  NEGATIVE 07/30/2011 1440   HGBUR SMALL (A) 09/16/2020 1256   BILIRUBINUR NEGATIVE 09/16/2020 1256   KETONESUR NEGATIVE 09/16/2020 1256   PROTEINUR 100 (A) 09/16/2020 1256   UROBILINOGEN 0.2 09/30/2011 1824   NITRITE NEGATIVE 09/16/2020 1256   LEUKOCYTESUR LARGE (A) 09/16/2020 1256    Radiological Exams on Admission: DG Chest Portable 1 View  Result Date: 10/05/2022 CLINICAL DATA:  Nonproductive cough.  Weakness. EXAM: PORTABLE CHEST 1 VIEW COMPARISON:  None Available. FINDINGS: The heart size and mediastinal contours are within normal limits. Asymmetric airspace disease is seen in both lower lobes, right side greater than left. This may be due to pneumonia or asymmetric edema. No evidence of pleural effusion. IMPRESSION: Asymmetric airspace disease in both lower lobes, right side greater than left. This may be due to pneumonia or asymmetric edema. Electronically Signed   By: Marlaine Hind M.D.   On: 10/05/2022 10:19    EKG: Independently reviewed.  Sinus rhythm, no acute ST changes.  Assessment/Plan Principal Problem:   CHF (congestive heart failure) (HCC) Active Problems:   Hypertension   End stage renal disease (HCC)   Acute on chronic diastolic CHF (congestive heart failure) (HCC)   Acute respiratory failure with hypoxia (HCC)  (please populate well all problems here in Problem List. (For example, if patient is on BP meds at home and you resume or decide to hold them, it is a problem that needs to be her. Same for CAD, COPD, HLD and so on)  Acute on chronic HFpEF decompensation -Secondary to noncoherent with HD -Emergency HD this afternoon. -Breathing comfortably with nasal cannula, no indication for BiPAP at this point.  Acute hypoxic respiratory failure -As above.  HTN emergency -Resume home BP meds including Coreg -Hold off amlodipine as patient is in active CHF decompensation -Add as needed hydralazine -Emergency dialysis as above  COVID PNA -His symptoms of CHF  decompensation mixed with COVID-19 PNA -D/W pharmacy, Paxlovid contra-indicated, will start short course of of p.o. steroid -Breathing treatment -Check D-dimer  Peripheral edema -With possible COVID induced hypercoagulable state and Hx of recent intercontinental flight traveling, check DVT study and D-dimer level  Elevated troponins -No chest pain, EKG showed no acute ST-T changes. -Trending 70>100, pattern is flat. Suspect demanding ischemia from CHF  decompensation/fluid overload and uncontrolled HTN, management as above. No ACS.  IIDM -Sliding scale  AKI on ESRD -Secondary to noncompliant with HD schedule, with significant acute on chronic uremia, worsening of non-anion gap metabolic acidosis, hyperkalemia and uremia -Emergency HD this afternoon.  Mixed HLD -Hold off statin and Ezetimibe as per pharmacy given there is a AKI  DVT prophylaxis: Heparin subQ Code Status: Full code Family Communication: Wife at bedside Disposition Plan: Expect less than 2 midnight hospital stay Consults called: Nephro Admission status: Tele obs   Lequita Halt MD [facility] Pager [phone]  10/05/2022, 12:43 PM

## 2022-10-05 NOTE — ED Notes (Signed)
ED TO INPATIENT HANDOFF REPORT  ED Nurse Name and Phone #: Altha Harm [phone]  S Name/Age/Gender [name] 72 y.o. male Room/Bed: 025C/025C  Code Status   Code Status: Full Code  Home/SNF/Other Home Patient oriented to: self, place, time, and situation Is this baseline? Yes   Triage Complete: Triage complete  Chief Complaint CHF (congestive heart failure) ([place]) [I50.9]  Triage Note Pt c/o dry cough, sneezing, generalized weaknessx2d. Pt denies any other sx. Pt denies SOB   Allergies No Known Allergies  Level of Care/Admitting Diagnosis ED Disposition     ED Disposition  Admit   Condition  --   Comment  Hospital Area: [facility] [[id number]]  Level of Care: Telemetry Medical [104]  May place patient in observation at [facility] or [facility] if equivalent level of care is available:: No  Covid Evaluation: Confirmed COVID Positive  Diagnosis: CHF (congestive heart failure) [id number]  Admitting Physician: Lequita Halt [id number]  Attending Physician: Lequita Halt [id number]          B Medical/Surgery History Past Medical History:  Diagnosis Date   Anemia    Anemia    Benign colon polyp 08/11/2010   Clot    STATED TOLD IN APRIL CLOT TO LEFT SHOULDER. 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;  Service: Vascular;  Laterality: Right;  Right Brachio-cephalic arteriovenous fistula   AV FISTULA PLACEMENT Right 07/18/2021   Procedure: INSERTION OF RIGHT UPPER EXTREMITY ARTERIOVENOUS (AV) GORE-TEX GRAFT;  Surgeon: [name], MD;  Location: [facility];  Service: Vascular;   Laterality: Right;  PERIPHERAL NERVE BLOCK   AV FISTULA PLACEMENT, RADIOCEPHALIC  [id number]   Right arm   INSERTION OF DIALYSIS CATHETER  01/19/2012   Procedure: INSERTION OF DIALYSIS CATHETER;  Surgeon: Conrad Ventura, MD;  Location: [facility];  Service: Vascular;  Laterality: N/A;  right internal jugular vein   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   UPPER EXTREMITY VENOGRAPHY Right 06/11/2021   Procedure: UPPER EXTREMITY VENOGRAPHY;  Surgeon: Serafina Mitchell, MD;  Location: [facility] CV LAB;  Service: Cardiovascular;  Laterality: Right;   VENOGRAM Left 05/30/2021   Procedure: DIAGNOSTIC CENTRAL VENOGRAM;  Surgeon: Broadus John, MD;  Location: [facility] OR;  Service: Vascular;  Laterality: Left;     A IV Location/Drains/Wounds Patient Lines/Drains/Airways Status     Active Line/Drains/Airways     Name Placement date Placement time Site Days   Peripheral IV 10/05/22 22 G 1.75" Anterior;Left Forearm 10/05/22  1255  Forearm  less than 1   Vascular Access Right Upper arm Arteriovenous fistula 01/19/12  1410  Upper arm  3912   Fistula / Graft Right Upper arm Arteriovenous vein graft 07/18/21  0823  Upper arm  444            Intake/Output Last 24 hours No intake or output data in the 24 hours ending 10/05/22 1356  Labs/Imaging Results for orders placed or performed during the hospital encounter of 10/05/22 (from the past 48 hour(s))  Resp panel by RT-PCR (RSV, Flu  A&B, Covid) Anterior Nasal Swab     Status: Abnormal   Collection Time: 10/05/22  9:21 AM   Specimen: Anterior Nasal Swab  Result Value Ref Range   SARS Coronavirus 2 by RT PCR POSITIVE (A) NEGATIVE   Influenza A by PCR NEGATIVE NEGATIVE   Influenza B by PCR NEGATIVE NEGATIVE    Comment: (NOTE) The Xpert Xpress SARS-CoV-2/FLU/RSV plus assay is intended as an aid in the diagnosis of influenza from Nasopharyngeal swab specimens and should not be used as a sole basis for treatment. Nasal washings and aspirates are unacceptable for  Xpert Xpress SARS-CoV-2/FLU/RSV testing.  Fact Sheet for Patients: EntrepreneurPulse.com.au  Fact Sheet for Healthcare Providers: IncredibleEmployment.be  This test is not yet approved or cleared by the [place] FDA and has been authorized for detection and/or diagnosis of SARS-CoV-2 by FDA under an Emergency Use Authorization (EUA). This EUA will remain in effect (meaning this test can be used) for the duration of the COVID-19 declaration under Section 564(b)(1) of the Act, 21 U.S.C. section 360bbb-3(b)(1), unless the authorization is terminated or revoked.     Resp Syncytial Virus by PCR NEGATIVE NEGATIVE    Comment: (NOTE) Fact Sheet for Patients: EntrepreneurPulse.com.au  Fact Sheet for Healthcare Providers: IncredibleEmployment.be  This test is not yet approved or cleared by the [place] FDA and has been authorized for detection and/or diagnosis of SARS-CoV-2 by FDA under an Emergency Use Authorization (EUA). This EUA will remain in effect (meaning this test can be used) for the duration of the COVID-19 declaration under Section 564(b)(1) of the Act, 21 U.S.C. section 360bbb-3(b)(1), unless the authorization is terminated or revoked.  Performed at [facility], [place] [address] [place], [place] [id number]   Basic metabolic panel     Status: Abnormal   Collection Time: 10/05/22  9:22 AM  Result Value Ref Range   Sodium 137 135 - 145 mmol/L   Potassium 5.4 (H) 3.5 - 5.1 mmol/L   Chloride 93 (L) 98 - 111 mmol/L   CO2 14 (L) 22 - 32 mmol/L   Glucose, Bld 89 70 - 99 mg/dL    Comment: Glucose reference range applies only to samples taken after fasting for at least 8 hours.   BUN 143 (H) 8 - 23 mg/dL   Creatinine, Ser 29.93 (H) 0.61 - 1.24 mg/dL    Comment: RESULT CONFIRMED BY MANUAL DILUTION   Calcium 9.4 8.9 - 10.3 mg/dL   GFR, Estimated 1 (L) >60 mL/min    Comment:  (NOTE) Calculated using the CKD-EPI Creatinine Equation (2021)    Anion gap 30 (H) 5 - 15    Comment: ELECTROLYTES REPEATED TO VERIFY Performed at [facility] [address], [place], [place] [address]   CBC     Status: Abnormal   Collection Time: 10/05/22  9:22 AM  Result Value Ref Range   WBC 7.1 4.0 - 10.5 K/uL   RBC 4.34 4.22 - 5.81 MIL/uL   Hemoglobin 12.0 (L) 13.0 - 17.0 g/dL   HCT 36.7 (L) 39.0 - 52.0 %   MCV 84.6 80.0 - 100.0 fL   MCH 27.6 26.0 - 34.0 pg   MCHC 32.7 30.0 - 36.0 g/dL   RDW 15.0 11.5 - 15.5 %   Platelets 182 150 - 400 K/uL   nRBC 0.0 0.0 - 0.2 %    Comment: Performed at [facility], [place] [address] [place], [place] [address]  Troponin I (High Sensitivity)  Status: Abnormal   Collection Time: 10/05/22  9:22 AM  Result Value Ref Range   Troponin I (High Sensitivity) 73 (H) <18 ng/L    Comment: (NOTE) Elevated high sensitivity troponin I (hsTnI) values and significant  changes across serial measurements may suggest ACS but many other  chronic and acute conditions are known to elevate hsTnI results.  Refer to the "Links" section for chest pain algorithms and additional  guidance. Performed at [facility], [place] [address] [place], [place] [address]    DG Chest Portable 1 View  Result Date: 10/05/2022 CLINICAL DATA:  Nonproductive cough.  Weakness. EXAM: PORTABLE CHEST 1 VIEW COMPARISON:  None Available. FINDINGS: The heart size and mediastinal contours are within normal limits. Asymmetric airspace disease is seen in both lower lobes, right side greater than left. This may be due to pneumonia or asymmetric edema. No evidence of pleural effusion. IMPRESSION: Asymmetric airspace disease in both lower lobes, right side greater than left. This may be due to pneumonia or asymmetric edema. Electronically Signed   By: Marlaine Hind M.D.   On: 10/05/2022 10:19    Pending Labs Unresulted Labs (From admission, onward)     Start     Ordered    10/06/22 [id number]  Basic metabolic panel  Daily,   R     Comments: As Scheduled for 5 days    10/05/22 1242   10/06/22 0500  CBC with Differential/Platelet  Daily,   R      10/05/22 1242   10/06/22 0500  C-reactive protein  Daily,   R      10/05/22 1242   10/06/22 0500  D-dimer, quantitative  Daily,   R      10/05/22 1242   10/06/22 0500  Ferritin  Daily,   R      10/05/22 1242   10/05/22 1242  D-dimer, quantitative  Once,   R        10/05/22 1242   10/05/22 1242  Ferritin  Once,   R        10/05/22 1242   10/05/22 1242  Fibrinogen  Once,   R        10/05/22 1242   10/05/22 1242  Lactate dehydrogenase  Once,   R        10/05/22 1242   10/05/22 1242  Procalcitonin  Once,   R       References:    Procalcitonin Lower Respiratory Tract Infection AND Sepsis Procalcitonin Algorithm   10/05/22 1242   10/05/22 1240  HIV Antibody (routine testing w rflx)  (HIV Antibody (Routine testing w reflex) panel)  Once,   R        10/05/22 1242   10/05/22 1240  Hemoglobin A1c  Once,   R       Comments: To assess prior glycemic control    10/05/22 1242            Vitals/Pain Today's Vitals   10/05/22 1140 10/05/22 1200 10/05/22 1230 10/05/22 1330  BP: (!) 204/100 (!) 184/83 (!) 200/93 (!) 201/92  Pulse: 83 83 84 77  Resp: '18 17 20 14  '$ Temp:      TempSrc:      SpO2: 95% 95% 95% 93%  Weight:      Height:      PainSc:        Isolation Precautions Airborne and Contact precautions  Medications Medications  carvedilol (COREG) tablet 25 mg (has no administration in time range)  sevelamer carbonate (RENVELA) tablet 1,600 mg (has no administration in time range)  sodium chloride flush (NS) 0.9 % injection 3 mL (3 mLs Intravenous Given 10/05/22 1318)  sodium chloride flush (NS) 0.9 % injection 3 mL (has no administration in time range)  0.9 %  sodium chloride infusion (has no administration in time range)  acetaminophen (TYLENOL) tablet 650 mg (has no administration in time range)   ondansetron (ZOFRAN) injection 4 mg (has no administration in time range)  heparin injection 5,000 Units (5,000 Units Subcutaneous Given 10/05/22 1314)  insulin aspart (novoLOG) injection 0-6 Units (has no administration in time range)  Ipratropium-Albuterol (COMBIVENT) respimat 1 puff (has no administration in time range)  dexamethasone (DECADRON) tablet 6 mg (6 mg Oral Given 10/05/22 1314)  guaiFENesin-dextromethorphan (ROBITUSSIN DM) 100-10 MG/5ML syrup 10 mL (has no administration in time range)  hydrALAZINE (APRESOLINE) injection 5 mg (has no administration in time range)  labetalol (NORMODYNE) injection 10 mg (10 mg Intravenous Given 10/05/22 1318)    Mobility walks with person assist     Focused Assessments Pulmonary Assessment Handoff:  Lung sounds:   O2 Device: Nasal Cannula O2 Flow Rate (L/min): 2 L/min    R Recommendations: See Admitting Provider Note  Report given to:   Additional Notes: Restricted L arm.  Dialysis M, W, F.

## 2022-10-05 NOTE — Progress Notes (Signed)
Approximately 1500-- Pt arrived to [facility]. Pt placed on appropriate Airborne/ Contact precautions for COVID-19 infection. VS obtained, BP 194/86 MAP 113. Pt on 4L [place]. Appears comfortable, no S/S of distress. Full assessment completed by this RN. Telemetry connected and notified. Pt's wife at bedside.    This RN messaged MD Roosevelt Locks regarding pt's BP. Directed to give scheduled meds, including BP meds d/t severe HTN. No time scheduled for HD for pt. RN to administer and continue to monitor pt.

## 2022-10-05 NOTE — ED Notes (Signed)
Wife reports Pt walks w/o assistance at home.  Pt is very weak right now.  Also, Pt keeps removing oxygen when he stands to pee.  O2 drops to high 80s w/o O2.

## 2022-10-05 NOTE — ED Notes (Signed)
IV team at bedside 

## 2022-10-05 NOTE — ED Notes (Signed)
Per the pt's wife, pt has not had dialysis for 1 wk

## 2022-10-05 NOTE — ED Provider Notes (Signed)
[name] Provider Note   CSN: [id number] Arrival date & time: 10/05/22  [id number]     History  Chief Complaint  Patient presents with   Cough    [name] is a 72 y.o. male.  Pt is a 72 yo male with pmhx significant for ESRD on HD (MWF), HLD, HTN, anemia, and ED.  Pt went to [place] for about 2 weeks.  While there, he had dialysis only 3 or 4 times.  He has not had any dialysis since 2/16.  Pt has just gotten home and said he's had cough and sneezing for 2 days.  No fever.       Home Medications Prior to Admission medications   Medication Sig Start Date End Date Taking? Authorizing Provider  ACCU-CHEK SOFTCLIX LANCETS lancets 1 each by Other route 2 (two) times daily. Use to check blood sugars twice a day 03/04/17   Janith Lima, MD  acetaminophen (TYLENOL) 500 MG tablet Take 1,000 mg by mouth every 8 (eight) hours as needed for moderate pain.    [provider]  amLODipine (NORVASC) 5 MG tablet Take 5 mg by mouth daily.    [provider]  Blood Glucose Monitoring Suppl (ACCU-CHEK AVIVA PLUS) w/Device KIT Use as directed to check blood sugars 03/04/17   Janith Lima, MD  carvedilol (COREG) 25 MG tablet Take 25 mg by mouth 2 (two) times daily with a meal.    [provider]  ezetimibe-simvastatin (VYTORIN) 10-10 MG tablet Take 1 tablet by mouth at bedtime. 12/27/21   Janith Lima, MD  glucose blood test strip Use TID 02/25/17   Janith Lima, MD  losartan (COZAAR) 25 MG tablet Take 25 mg by mouth in the morning and at bedtime. 12/10/20   [provider]  sevelamer carbonate (RENVELA) 800 MG tablet Take 1,600 mg by mouth 3 (three) times daily with meals. 04/01/21   [provider]  sildenafil (REVATIO) 20 MG tablet Take 1 tablet (20 mg total) by mouth daily. 12/26/21   Janith Lima, MD      Allergies    Patient has no known allergies.    Review of Systems   Review of Systems   Respiratory:  Positive for cough and shortness of breath.   All other systems reviewed and are negative.   Physical Exam Updated Vital Signs BP (!) 204/100   Pulse 83   Temp 98 F (36.7 C) (Oral)   Resp 18   Ht '5\' 9"'$  (1.753 m)   Wt 79.4 kg   SpO2 95%   BMI 25.85 kg/m  Physical Exam Vitals and nursing note reviewed.  Constitutional:      Appearance: Normal appearance.  HENT:     Head: Normocephalic and atraumatic.     Right Ear: External ear normal.     Left Ear: External ear normal.     Nose: Rhinorrhea present.     Mouth/Throat:     Mouth: Mucous membranes are moist.     Pharynx: Oropharynx is clear.  Eyes:     Extraocular Movements: Extraocular movements intact.     Conjunctiva/sclera: Conjunctivae normal.     Pupils: Pupils are equal, round, and reactive to light.  Cardiovascular:     Rate and Rhythm: Normal rate and regular rhythm.     Pulses: Normal pulses.     Heart sounds: Normal heart sounds.  Pulmonary:     Breath sounds: Rhonchi present.  Abdominal:     General: Abdomen is flat. Bowel sounds are normal.     Palpations: Abdomen is soft.  Musculoskeletal:     Cervical back: Normal range of motion and neck supple.     Comments: AVF RUE with good thrill  Skin:    General: Skin is warm.     Capillary Refill: Capillary refill takes less than 2 seconds.  Neurological:     General: No focal deficit present.     Mental Status: He is alert and oriented to person, place, and time.  Psychiatric:        Mood and Affect: Mood normal.        Behavior: Behavior normal.     ED Results / Procedures / Treatments   Labs (all labs ordered are listed, but only abnormal results are displayed) Labs Reviewed  RESP PANEL BY RT-PCR (RSV, FLU A&B, COVID)  [id number] - Abnormal; Notable for the following components:      Result Value   SARS Coronavirus 2 by RT PCR POSITIVE (*)    All other components within normal limits  BASIC METABOLIC PANEL - Abnormal; Notable for the  following components:   Potassium 5.4 (*)    Chloride 93 (*)    CO2 14 (*)    BUN 143 (*)    Creatinine, Ser 29.93 (*)    GFR, Estimated 1 (*)    Anion gap 30 (*)    All other components within normal limits  CBC - Abnormal; Notable for the following components:   Hemoglobin 12.0 (*)    HCT 36.7 (*)    All other components within normal limits  TROPONIN I (HIGH SENSITIVITY) - Abnormal; Notable for the following components:   Troponin I (High Sensitivity) 73 (*)    All other components within normal limits  TROPONIN I (HIGH SENSITIVITY)    EKG EKG Interpretation  Date/Time:  Sunday October 05 2022 09:02:07 EST Ventricular Rate:  85 PR Interval:  190 QRS Duration: 90 QT Interval:  416 QTC Calculation: 495 R Axis:   1 Text Interpretation: Normal sinus rhythm Prolonged QT Abnormal ECG When compared with ECG of 15-Sep-2020 21:23, PREVIOUS ECG IS PRESENT No significant change since last tracing Confirmed by Isla Pence [id number]) on 10/05/2022 10:17:03 AM  Radiology DG Chest Portable 1 View  Result Date: 10/05/2022 CLINICAL DATA:  Nonproductive cough.  Weakness. EXAM: PORTABLE CHEST 1 VIEW COMPARISON:  None Available. FINDINGS: The heart size and mediastinal contours are within normal limits. Asymmetric airspace disease is seen in both lower lobes, right side greater than left. This may be due to pneumonia or asymmetric edema. No evidence of pleural effusion. IMPRESSION: Asymmetric airspace disease in both lower lobes, right side greater than left. This may be due to pneumonia or asymmetric edema. Electronically Signed   By: Marlaine Hind M.D.   On: 10/05/2022 10:19    Procedures Procedures    Medications Ordered in ED Medications  labetalol (NORMODYNE) injection 10 mg (has no administration in time range)    ED Course/ Medical Decision Making/ A&P                             Medical Decision Making Amount and/or Complexity of Data Reviewed Labs: ordered. Radiology:  ordered.  Risk Prescription drug management. Decision regarding hospitalization.   This patient presents to the ED for concern of sob, this involves an extensive number of treatment options, and is a complaint  that carries with it a high risk of complications and morbidity.  The differential diagnosis includes pulmonary edema, pna, covid/flu/rsv   Co morbidities that complicate the patient evaluation  ESRD on HD (MWF), HLD, HTN, anemia, and ED   Additional history obtained:  Additional history obtained from epic chart review External records from outside source obtained and reviewed including wife   Lab Tests:  I Ordered, and personally interpreted labs.  The pertinent results include:  cbc with hgb 12.0 (chronic); bmp with k 5.4, CO2 14, BUN 143 and Cr 29.93, trop elevated at 73, Covid +   Imaging Studies ordered:  I ordered imaging studies including cxr  I independently visualized and interpreted imaging which showed  Asymmetric airspace disease in both lower lobes, right side greater  than left. This may be due to pneumonia or asymmetric edema.   I agree with the radiologist interpretation   Cardiac Monitoring:  The patient was maintained on a cardiac monitor.  I personally viewed and interpreted the cardiac monitored which showed an underlying rhythm of: nsr   Medicines ordered and prescription drug management:  I ordered medication including labetalol  for htn  Reevaluation of the patient after these medicines showed that the patient improved I have reviewed the patients home medicines and have made adjustments as needed  Critical Interventions:  oxygen   Consultations Obtained:  I requested consultation with the nephrologist (Dr. Jonnie Finner),  and discussed lab and imaging findings as well as pertinent plan - he will see pt in consult. Pt d/w Dr. Roosevelt Locks (triad) for admission.   Problem List / ED Course:  ESRD on HD:  pt has not had adequate dialysis in  2-3 weeks.  K is elevated.  CXR with edema.  Pt is hypoxic, but that is unclear if it's from edema or from the covid. Covid-19:  +.  Hypoxic to 88% on RA.  Pt put on 2L and O2 sat in the low-90s. HTN:   pt has not taken any meds today   Reevaluation:  After the interventions noted above, I reevaluated the patient and found that they have :improved   Social Determinants of Health:  Lives at home   Dispostion:  After consideration of the diagnostic results and the patients response to treatment, I feel that the patent would benefit from admission.    Jolene Schimke was evaluated in Emergency Department on 10/05/2022 for the symptoms described in the history of present illness. He was evaluated in the context of the global COVID-19 pandemic, which necessitated consideration that the patient might be at risk for infection with the SARS-CoV-2 virus that causes COVID-19. Institutional protocols and algorithms that pertain to the evaluation of patients at risk for COVID-19 are in a state of rapid change based on information released by regulatory bodies including the CDC and federal and state organizations. These policies and algorithms were followed during the patient's care in the ED.         Final Clinical Impression(s) / ED Diagnoses Final diagnoses:  COVID-19  ESRD on hemodialysis ([place])  Acute pulmonary edema ([place])  Hypertension, unspecified type  Acute respiratory failure with hypoxia [facility])    Rx / DC Orders ED Discharge Orders     None         Isla Pence, MD 10/05/22 1218

## 2022-10-05 NOTE — ED Triage Notes (Addendum)
Pt c/o dry cough, sneezing, generalized weaknessx2d. Pt denies any other sx. Pt denies SOB

## 2022-10-06 ENCOUNTER — Encounter (HOSPITAL_COMMUNITY): Payer: Medicare Other

## 2022-10-06 DIAGNOSIS — I509 Heart failure, unspecified: Secondary | ICD-10-CM | POA: Diagnosis not present

## 2022-10-06 LAB — HEMOGLOBIN A1C
Hgb A1c MFr Bld: 5.2 % (ref 4.8–5.6)
Mean Plasma Glucose: 103 mg/dL

## 2022-10-06 MED ORDER — IPRATROPIUM-ALBUTEROL 20-100 MCG/ACT IN AERS: 1.0000 | INHALATION_SPRAY | Freq: Four times a day (QID) | RESPIRATORY_TRACT | Status: DC | PRN

## 2022-10-06 NOTE — Progress Notes (Signed)
Pre HD tx:   patient in bed.  Alert and oriented.  Informed consent signed and in chart.   Post HD tx.  TX duration: 2:45 Patient tolerated well.  Alert, without acute distress.  Hand-off given to patient's nurse.   Access used: graft Access issues: none  Total UF removed: 3000 ml Medication(s) given: none Post HD VS: 151/67, 78 Post HD weight: 75.8 kg     10/06/22 0506  Vitals  Temp 97.8 F (36.6 C)  Temp Source Oral  BP (!) 151/67  MAP (mmHg) 86  BP Location Left Arm  BP Method Automatic  Patient Position (if appropriate) Lying  Pulse Rate Source Monitor  ECG Heart Rate 69  Resp 15  Oxygen Therapy  SpO2 95 %  O2 Device Room Air  Patient Activity (if Appropriate) In bed  Pulse Oximetry Type Continuous  Post Treatment  Dialyzer Clearance Lightly streaked  Duration of HD Treatment -hour(s) 2.45 hour(s)  Hemodialysis Intake (mL) 0 mL  Liters Processed 41.7  Fluid Removed (mL) 3000 mL  Tolerated HD Treatment Yes  Post-Hemodialysis Comments HD tx achievd as expected, tolerated well, uf goal reached, no complaints  AVG/AVF Arterial Site Held (minutes) 10 minutes  AVG/AVF Venous Site Held (minutes) 10 minutes  Note  Observations pt is in bed, alert, oriented, verbally responsive , and stable.  Fistula / Graft Right Upper arm Arteriovenous vein graft  Placement Date/Time: 07/18/21 0823   Placed prior to admission: No  Orientation: Right  Access Location: Upper arm  Access Type: (c) Arteriovenous vein graft  Site Condition No complications  Fistula / Graft Assessment Present;Bruit;Thrill;Aneurysm present  Status Deaccessed  Drainage Description None   t

## 2022-10-06 NOTE — Progress Notes (Signed)
[facility] Progress Note    Assessment/ Plan:   Acute hypoxic resp failure - could be due to COVID and/or vol overload. Doesn't looks vol overloaded on exam but has missed 8 days of dialysis. And BP's are quite high. Will c/w HD MWF if patient is agreeable Volume - as above ESRD - on HD MWF. Has been overseas in [place], last HD in [place] 1/31. Last HD was 2/17 in [place]. Pt quite azotemic but tolerating it well, prob has early mild uremia w/ N/V. Will need gradual lowering of solute over next several days. S/p slow restart 2/25-2/26 (overnight). Plan for HD today (if patient is agreeable) otherwise will need to reattempt tomorrow Volume - get standing wt prior to HD. CXR could be fluid or infection (or both) HTN - would just resume his home meds including norvasc, coreg and losartan. Get vol down w/ HD. 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 PTH ordered. Will resume sensipar and vdra as they were in late Jan here.  Subjective:   Patient seen and examined, discussed with RN as well. Patient refusing CBG's and meds. He is quite upset especially given that he had HD overnight which he tolerated. He reports that he has "not doing dialysis at [facility]". Net UF 3L. Despite discussing plan and rationale, he is refusing HD today, discussed risks. Wife at bedside   Objective:   BP (!) 151/67 (BP Location: Left Arm)   Pulse 63   Temp 97.8 F (36.6 C) (Oral)   Resp 15   Ht '5\' 11"'$  (1.803 m)   Wt 75.8 kg   SpO2 95%   BMI 23.31 kg/m   Intake/Output Summary (Last 24 hours) at 10/06/2022 0849 Last data filed at 10/06/2022 [id number] Gross per 24 hour  Intake 240 ml  Output 3000 ml  Net -2760 ml   Weight change:   Physical Exam: Gen: NAD, agitated CVS: RRR Resp: normal WOB, laying flat in bed Abd: soft Ext: no edema Dialysis access: RUE AVG +b/t  Imaging: DG Chest Portable 1 View  Result Date:  10/05/2022 CLINICAL DATA:  Nonproductive cough.  Weakness. EXAM: PORTABLE CHEST 1 VIEW COMPARISON:  None Available. FINDINGS: The heart size and mediastinal contours are within normal limits. Asymmetric airspace disease is seen in both lower lobes, right side greater than left. This may be due to pneumonia or asymmetric edema. No evidence of pleural effusion. IMPRESSION: Asymmetric airspace disease in both lower lobes, right side greater than left. This may be due to pneumonia or asymmetric edema. Electronically Signed   By: Marlaine Hind M.D.   On: 10/05/2022 10:19    Labs: BMET Recent Labs  Lab 10/05/22 0922  NA 137  K 5.4*  CL 93*  CO2 14*  GLUCOSE 89  BUN 143*  CREATININE 29.93*  CALCIUM 9.4   CBC Recent Labs  Lab 10/05/22 0922  WBC 7.1  HGB 12.0*  HCT 36.7*  MCV 84.6  PLT 182    Medications:     carvedilol  12.5 mg Oral Once   carvedilol  25 mg Oral BID   Chlorhexidine Gluconate Cloth  6 each Topical Q0600   Chlorhexidine Gluconate Cloth  6 each Topical Q0600   dexamethasone  6 mg Oral Q24H   heparin  5,000 Units Subcutaneous Q12H   hydrALAZINE  50 mg Oral Q8H   insulin aspart  0-6 Units Subcutaneous TID  WC   isosorbide mononitrate  30 mg Oral Daily   sevelamer carbonate  1,600 mg Oral TID WC   sodium chloride flush  3 mL Intravenous Q12H      Gean Quint, MD [facility] 10/06/2022, 8:49 AM

## 2022-10-06 NOTE — TOC Transition Note (Signed)
Transition of Care [facility]) - CM/SW Discharge Note   Patient Details  Name: [name] MRN: [id number] Date of Birth: August 04, 1951  Transition of Care [facility]) CM/SW Contact:  Zenon Mayo, RN Phone Number: 10/06/2022, 9:13 AM   Clinical Narrative:    Leaving AMA         Patient Goals and CMS Choice      Discharge Placement                         Discharge Plan and Services Additional resources added to the After Visit Summary for                                       Social Determinants of Health (SDOH) Interventions [place]: Low Risk  (10/05/2022)  Depression (PHQ2-9): Low Risk  (12/26/2021)  Tobacco Use: Low Risk  (10/05/2022)     Readmission Risk Interventions     No data to display

## 2022-10-06 NOTE — Progress Notes (Signed)
Contacted Dr. Irene Pap to notify that pt refused lab work and blood sugar check. Nurse educated pt on the importance of these but pt still refused.

## 2022-10-06 NOTE — Discharge Summary (Signed)
[name] [id number] DOB: February 26, 1951 DOA: 10/05/2022  PCP: Greg Lima, MD  Admit date: 10/05/2022 Discharge date: 10/06/2022  Time spent: 15 minutes  Recommendations for Outpatient Follow-up:  Nephrology f/u     Discharge Diagnoses:  Principal Problem:   CHF (congestive heart failure) ([place]) Active Problems:   Hypertension   End stage renal disease ([place])   Acute on chronic diastolic CHF (congestive heart failure) ([place])   Acute respiratory failure with hypoxia ([place])   Discharge Condition: stable  Diet recommendation: low sodium  Filed Weights   10/05/22 0915 10/05/22 1456 10/06/22 0524  Weight: 79.4 kg 77.9 kg 75.8 kg    History of present illness:  From admission h and p [name] is a 72 y.o. male with medical history significant of ESRD on HD MWF, chronic HFpEF, IIDM, HTN, HLD, came with worsening of cough and shortness of breath.   Underwent a trip recently to [place], and received 4 HD during 2 weeks trip and came back 3 days ago.  Last HD was last Saturday while he was seen [place].  2 days ago, patient started develop new dry cough and increasing exertional dyspnea denies any chest pain, no fever or chills.  No loss of taste no diarrhea.  Last night, patient developed generalized weakness, orthopnea and decided to come in to the hospital this morning.  Hospital Course:  Patient presents with cough and shortness of breath in setting of several sessions missed dialysis. He was treated with urgent hemodialysis the day of his admission. He was also found to have covid-19 and hypertensive urgency and mild hyperkalemia. On the morning of hospital day one the patient refused morning labs, self-discontinued his IV, and left the hospital against medical advice, refusing to wait to discuss his condition and suitability for discharge with his attending physician.   Procedures: none   Consultations: none  Discharge Exam: Vitals:   10/06/22 0506 10/06/22  0800  BP: (!) 151/67   Pulse:    Resp: 15 15  Temp: 97.8 F (36.6 C)   SpO2: 95%     Exam: none, left AMA prior to exam  Discharge Instructions   Discharge Instructions     Diet - low sodium heart healthy   Complete by: As directed    Increase activity slowly   Complete by: As directed       Allergies as of 10/06/2022   No Known Allergies      Medication List     TAKE these medications    Accu-Chek Aviva Plus w/Device Kit Use as directed to check blood sugars   Accu-Chek Softclix Lancets lancets 1 each by Other route 2 (two) times daily. Use to check blood sugars twice a day   acetaminophen 500 MG tablet Commonly known as: TYLENOL Take 1,000 mg by mouth every 8 (eight) hours as needed for moderate pain.   amLODipine 5 MG tablet Commonly known as: NORVASC Take 5 mg by mouth daily.   carvedilol 25 MG tablet Commonly known as: COREG Take 25 mg by mouth 2 (two) times daily with a meal.   ezetimibe-simvastatin 10-10 MG tablet Commonly known as: Vytorin Take 1 tablet by mouth at bedtime.   glucose blood test strip Use TID   hydrALAZINE 25 MG tablet Commonly known as: APRESOLINE Take 25 mg by mouth 2 (two) times daily.   Lokelma 10 g Pack packet Generic drug: sodium zirconium cyclosilicate Take 10 g by mouth 2 (two) times a week.   losartan  25 MG tablet Commonly known as: COZAAR Take 25 mg by mouth in the morning and at bedtime.   sevelamer carbonate 800 MG tablet Commonly known as: RENVELA Take 1,600 mg by mouth 3 (three) times daily with meals.   sildenafil 20 MG tablet Commonly known as: REVATIO Take 1 tablet (20 mg total) by mouth daily.   Velphoro 500 MG chewable tablet Generic drug: sucroferric oxyhydroxide Chew 1,000 mg by mouth 3 (three) times daily.       No Known Allergies    The results of significant diagnostics from this hospitalization (including imaging, microbiology, ancillary and laboratory) are listed below for  reference.    Significant Diagnostic Studies: DG Chest Portable 1 View  Result Date: 10/05/2022 CLINICAL DATA:  Nonproductive cough.  Weakness. EXAM: PORTABLE CHEST 1 VIEW COMPARISON:  None Available. FINDINGS: The heart size and mediastinal contours are within normal limits. Asymmetric airspace disease is seen in both lower lobes, right side greater than left. This may be due to pneumonia or asymmetric edema. No evidence of pleural effusion. IMPRESSION: Asymmetric airspace disease in both lower lobes, right side greater than left. This may be due to pneumonia or asymmetric edema. Electronically Signed   By: Marlaine Hind M.D.   On: 10/05/2022 10:19    Microbiology: Recent Results (from the past 240 hour(s))  Resp panel by RT-PCR (RSV, Flu A&B, Covid) Anterior Nasal Swab     Status: Abnormal   Collection Time: 10/05/22  9:21 AM   Specimen: Anterior Nasal Swab  Result Value Ref Range Status   SARS Coronavirus 2 by RT PCR POSITIVE (A) NEGATIVE Final   Influenza A by PCR NEGATIVE NEGATIVE Final   Influenza B by PCR NEGATIVE NEGATIVE Final    Comment: (NOTE) The Xpert Xpress SARS-CoV-2/FLU/RSV plus assay is intended as an aid in the diagnosis of influenza from Nasopharyngeal swab specimens and should not be used as a sole basis for treatment. Nasal washings and aspirates are unacceptable for Xpert Xpress SARS-CoV-2/FLU/RSV testing.  Fact Sheet for Patients: EntrepreneurPulse.com.au  Fact Sheet for Healthcare Providers: IncredibleEmployment.be  This test is not yet approved or cleared by the [place] FDA and has been authorized for detection and/or diagnosis of SARS-CoV-2 by FDA under an Emergency Use Authorization (EUA). This EUA will remain in effect (meaning this test can be used) for the duration of the COVID-19 declaration under Section 564(b)(1) of the Act, 21 U.S.C. section 360bbb-3(b)(1), unless the authorization is terminated  or revoked.     Resp Syncytial Virus by PCR NEGATIVE NEGATIVE Final    Comment: (NOTE) Fact Sheet for Patients: EntrepreneurPulse.com.au  Fact Sheet for Healthcare Providers: IncredibleEmployment.be  This test is not yet approved or cleared by the [place] FDA and has been authorized for detection and/or diagnosis of SARS-CoV-2 by FDA under an Emergency Use Authorization (EUA). This EUA will remain in effect (meaning this test can be used) for the duration of the COVID-19 declaration under Section 564(b)(1) of the Act, 21 U.S.C. section 360bbb-3(b)(1), unless the authorization is terminated or revoked.  Performed at [facility], [place] [address] [place], [place] [address]   MRSA Next Gen by PCR, Nasal     Status: None   Collection Time: 10/05/22  5:00 PM   Specimen: Nasal Mucosa; Nasal Swab  Result Value Ref Range Status   MRSA by PCR Next Gen NOT DETECTED NOT DETECTED Final    Comment: (NOTE) The GeneXpert MRSA Assay (FDA approved for NASAL specimens only), is one  component of a comprehensive MRSA colonization surveillance program. It is not intended to diagnose MRSA infection nor to guide or monitor treatment for MRSA infections. Test performance is not FDA approved in patients less than 24 years old. Performed at [facility], [place] [address], [place], [place] [address]      Labs: Basic Metabolic Panel: Recent Labs  Lab 10/05/22 0922  NA 137  K 5.4*  CL 93*  CO2 14*  GLUCOSE 89  BUN 143*  CREATININE 29.93*  CALCIUM 9.4   Liver Function Tests: No results for input(s): "AST", "ALT", "ALKPHOS", "BILITOT", "PROT", "ALBUMIN" in the last 168 hours. No results for input(s): "LIPASE", "AMYLASE" in the last 168 hours. No results for input(s): "AMMONIA" in the last 168 hours. CBC: Recent Labs  Lab 10/05/22 0922  WBC 7.1  HGB 12.0*  HCT 36.7*  MCV 84.6  PLT 182   Cardiac Enzymes: No results for input(s):  "CKTOTAL", "CKMB", "CKMBINDEX", "TROPONINI" in the last 168 hours. BNP: BNP (last 3 results) No results for input(s): "BNP" in the last 8760 hours.  ProBNP (last 3 results) No results for input(s): "PROBNP" in the last 8760 hours.  CBG: Recent Labs  Lab 10/05/22 1643  GLUCAP 122*       Signed:  Desma Maxim MD.  [facility] 10/06/2022, 9:06 AM

## 2022-10-06 NOTE — Progress Notes (Signed)
Patient continues to refuse assessment or treatment. RN informed patient that MD (Dr. Ellyn Hack) is on department and will come in to talk with patient.  Patient continued to state that he will not be "incriminated" and will not wait for MD.  Risks of leaving AMA educated to patient and wife.  Patient and wife verbalize understanding and patient still states that he wants to leave or he will call the police to say he is being held against his will.  Patient refused to sign AMA form, stating that this will incriminate him.  Patient left with wife.

## 2022-10-06 NOTE — Progress Notes (Signed)
Patient refusing all assessments and medications.  RNs attempted to educate patient on rationale for ordered medications, vital signs, CBGs, and assessments.  Patient refuses to listed, stating that he is not interested.  Wife at bedside attempting to convince patient to participate in treatment plan.  Patient stating that we are trying to "put something in his body and incriminate him".  RN paged hospitalist.

## 2022-10-07 LAB — HEPATITIS B SURFACE ANTIBODY, QUANTITATIVE: Hep B S AB Quant (Post): 51.1 m[IU]/mL (ref >9.9)

## 2022-10-08 DIAGNOSIS — U071 COVID-19: Secondary | ICD-10-CM | POA: Diagnosis not present

## 2022-10-08 DIAGNOSIS — N2581 Secondary hyperparathyroidism of renal origin: Secondary | ICD-10-CM | POA: Diagnosis not present

## 2022-10-08 DIAGNOSIS — D688 Other specified coagulation defects: Secondary | ICD-10-CM | POA: Diagnosis not present

## 2022-10-08 DIAGNOSIS — Z992 Dependence on renal dialysis: Secondary | ICD-10-CM | POA: Diagnosis not present

## 2022-10-08 DIAGNOSIS — N186 End stage renal disease: Secondary | ICD-10-CM | POA: Diagnosis not present

## 2022-10-09 DIAGNOSIS — N186 End stage renal disease: Secondary | ICD-10-CM | POA: Diagnosis not present

## 2022-10-09 DIAGNOSIS — Z992 Dependence on renal dialysis: Secondary | ICD-10-CM | POA: Diagnosis not present

## 2022-10-09 DIAGNOSIS — T8610 Unspecified complication of kidney transplant: Secondary | ICD-10-CM | POA: Diagnosis not present

## 2022-10-10 DIAGNOSIS — N186 End stage renal disease: Secondary | ICD-10-CM | POA: Diagnosis not present

## 2022-10-10 DIAGNOSIS — D688 Other specified coagulation defects: Secondary | ICD-10-CM | POA: Diagnosis not present

## 2022-10-10 DIAGNOSIS — Z992 Dependence on renal dialysis: Secondary | ICD-10-CM | POA: Diagnosis not present

## 2022-10-10 DIAGNOSIS — N2581 Secondary hyperparathyroidism of renal origin: Secondary | ICD-10-CM | POA: Diagnosis not present

## 2022-10-10 DIAGNOSIS — D631 Anemia in chronic kidney disease: Secondary | ICD-10-CM | POA: Diagnosis not present

## 2022-10-13 DIAGNOSIS — N2581 Secondary hyperparathyroidism of renal origin: Secondary | ICD-10-CM | POA: Diagnosis not present

## 2022-10-13 DIAGNOSIS — Z992 Dependence on renal dialysis: Secondary | ICD-10-CM | POA: Diagnosis not present

## 2022-10-13 DIAGNOSIS — D688 Other specified coagulation defects: Secondary | ICD-10-CM | POA: Diagnosis not present

## 2022-10-13 DIAGNOSIS — D631 Anemia in chronic kidney disease: Secondary | ICD-10-CM | POA: Diagnosis not present

## 2022-10-13 DIAGNOSIS — N186 End stage renal disease: Secondary | ICD-10-CM | POA: Diagnosis not present

## 2022-10-15 DIAGNOSIS — D631 Anemia in chronic kidney disease: Secondary | ICD-10-CM | POA: Diagnosis not present

## 2022-10-15 DIAGNOSIS — D688 Other specified coagulation defects: Secondary | ICD-10-CM | POA: Diagnosis not present

## 2022-10-15 DIAGNOSIS — N2581 Secondary hyperparathyroidism of renal origin: Secondary | ICD-10-CM | POA: Diagnosis not present

## 2022-10-15 DIAGNOSIS — Z992 Dependence on renal dialysis: Secondary | ICD-10-CM | POA: Diagnosis not present

## 2022-10-15 DIAGNOSIS — U071 COVID-19: Secondary | ICD-10-CM | POA: Diagnosis not present

## 2022-10-15 DIAGNOSIS — N186 End stage renal disease: Secondary | ICD-10-CM | POA: Diagnosis not present

## 2022-10-17 DIAGNOSIS — D631 Anemia in chronic kidney disease: Secondary | ICD-10-CM | POA: Diagnosis not present

## 2022-10-17 DIAGNOSIS — D688 Other specified coagulation defects: Secondary | ICD-10-CM | POA: Diagnosis not present

## 2022-10-17 DIAGNOSIS — N2581 Secondary hyperparathyroidism of renal origin: Secondary | ICD-10-CM | POA: Diagnosis not present

## 2022-10-17 DIAGNOSIS — N186 End stage renal disease: Secondary | ICD-10-CM | POA: Diagnosis not present

## 2022-10-17 DIAGNOSIS — Z992 Dependence on renal dialysis: Secondary | ICD-10-CM | POA: Diagnosis not present

## 2022-10-20 DIAGNOSIS — D631 Anemia in chronic kidney disease: Secondary | ICD-10-CM | POA: Diagnosis not present

## 2022-10-20 DIAGNOSIS — N186 End stage renal disease: Secondary | ICD-10-CM | POA: Diagnosis not present

## 2022-10-20 DIAGNOSIS — D688 Other specified coagulation defects: Secondary | ICD-10-CM | POA: Diagnosis not present

## 2022-10-20 DIAGNOSIS — Z992 Dependence on renal dialysis: Secondary | ICD-10-CM | POA: Diagnosis not present

## 2022-10-20 DIAGNOSIS — N2581 Secondary hyperparathyroidism of renal origin: Secondary | ICD-10-CM | POA: Diagnosis not present

## 2022-10-24 DIAGNOSIS — Z992 Dependence on renal dialysis: Secondary | ICD-10-CM | POA: Diagnosis not present

## 2022-10-24 DIAGNOSIS — D631 Anemia in chronic kidney disease: Secondary | ICD-10-CM | POA: Diagnosis not present

## 2022-10-24 DIAGNOSIS — D688 Other specified coagulation defects: Secondary | ICD-10-CM | POA: Diagnosis not present

## 2022-10-24 DIAGNOSIS — N2581 Secondary hyperparathyroidism of renal origin: Secondary | ICD-10-CM | POA: Diagnosis not present

## 2022-10-24 DIAGNOSIS — N186 End stage renal disease: Secondary | ICD-10-CM | POA: Diagnosis not present

## 2022-10-27 DIAGNOSIS — N2581 Secondary hyperparathyroidism of renal origin: Secondary | ICD-10-CM | POA: Diagnosis not present

## 2022-10-27 DIAGNOSIS — D688 Other specified coagulation defects: Secondary | ICD-10-CM | POA: Diagnosis not present

## 2022-10-27 DIAGNOSIS — D631 Anemia in chronic kidney disease: Secondary | ICD-10-CM | POA: Diagnosis not present

## 2022-10-27 DIAGNOSIS — N186 End stage renal disease: Secondary | ICD-10-CM | POA: Diagnosis not present

## 2022-10-27 DIAGNOSIS — Z992 Dependence on renal dialysis: Secondary | ICD-10-CM | POA: Diagnosis not present

## 2022-10-31 DIAGNOSIS — N186 End stage renal disease: Secondary | ICD-10-CM | POA: Diagnosis not present

## 2022-10-31 DIAGNOSIS — Z992 Dependence on renal dialysis: Secondary | ICD-10-CM | POA: Diagnosis not present

## 2022-10-31 DIAGNOSIS — N2581 Secondary hyperparathyroidism of renal origin: Secondary | ICD-10-CM | POA: Diagnosis not present

## 2022-10-31 DIAGNOSIS — D631 Anemia in chronic kidney disease: Secondary | ICD-10-CM | POA: Diagnosis not present

## 2022-10-31 DIAGNOSIS — D688 Other specified coagulation defects: Secondary | ICD-10-CM | POA: Diagnosis not present

## 2022-11-03 DIAGNOSIS — N186 End stage renal disease: Secondary | ICD-10-CM | POA: Diagnosis not present

## 2022-11-03 DIAGNOSIS — Z992 Dependence on renal dialysis: Secondary | ICD-10-CM | POA: Diagnosis not present

## 2022-11-03 DIAGNOSIS — D631 Anemia in chronic kidney disease: Secondary | ICD-10-CM | POA: Diagnosis not present

## 2022-11-03 DIAGNOSIS — N2581 Secondary hyperparathyroidism of renal origin: Secondary | ICD-10-CM | POA: Diagnosis not present

## 2022-11-03 DIAGNOSIS — D688 Other specified coagulation defects: Secondary | ICD-10-CM | POA: Diagnosis not present

## 2022-11-07 DIAGNOSIS — N2581 Secondary hyperparathyroidism of renal origin: Secondary | ICD-10-CM | POA: Diagnosis not present

## 2022-11-07 DIAGNOSIS — D688 Other specified coagulation defects: Secondary | ICD-10-CM | POA: Diagnosis not present

## 2022-11-07 DIAGNOSIS — N186 End stage renal disease: Secondary | ICD-10-CM | POA: Diagnosis not present

## 2022-11-07 DIAGNOSIS — Z992 Dependence on renal dialysis: Secondary | ICD-10-CM | POA: Diagnosis not present

## 2022-11-07 DIAGNOSIS — D631 Anemia in chronic kidney disease: Secondary | ICD-10-CM | POA: Diagnosis not present

## 2022-11-09 DIAGNOSIS — N186 End stage renal disease: Secondary | ICD-10-CM | POA: Diagnosis not present

## 2022-11-09 DIAGNOSIS — T8610 Unspecified complication of kidney transplant: Secondary | ICD-10-CM | POA: Diagnosis not present

## 2022-11-09 DIAGNOSIS — Z992 Dependence on renal dialysis: Secondary | ICD-10-CM | POA: Diagnosis not present

## 2022-11-10 DIAGNOSIS — Z992 Dependence on renal dialysis: Secondary | ICD-10-CM | POA: Diagnosis not present

## 2022-11-10 DIAGNOSIS — N2581 Secondary hyperparathyroidism of renal origin: Secondary | ICD-10-CM | POA: Diagnosis not present

## 2022-11-10 DIAGNOSIS — N186 End stage renal disease: Secondary | ICD-10-CM | POA: Diagnosis not present

## 2022-11-10 DIAGNOSIS — D509 Iron deficiency anemia, unspecified: Secondary | ICD-10-CM | POA: Diagnosis not present

## 2022-11-10 DIAGNOSIS — D688 Other specified coagulation defects: Secondary | ICD-10-CM | POA: Diagnosis not present

## 2022-11-10 DIAGNOSIS — D631 Anemia in chronic kidney disease: Secondary | ICD-10-CM | POA: Diagnosis not present

## 2022-11-14 DIAGNOSIS — N186 End stage renal disease: Secondary | ICD-10-CM | POA: Diagnosis not present

## 2022-11-14 DIAGNOSIS — D631 Anemia in chronic kidney disease: Secondary | ICD-10-CM | POA: Diagnosis not present

## 2022-11-14 DIAGNOSIS — D688 Other specified coagulation defects: Secondary | ICD-10-CM | POA: Diagnosis not present

## 2022-11-14 DIAGNOSIS — D509 Iron deficiency anemia, unspecified: Secondary | ICD-10-CM | POA: Diagnosis not present

## 2022-11-14 DIAGNOSIS — N2581 Secondary hyperparathyroidism of renal origin: Secondary | ICD-10-CM | POA: Diagnosis not present

## 2022-11-14 DIAGNOSIS — Z992 Dependence on renal dialysis: Secondary | ICD-10-CM | POA: Diagnosis not present

## 2022-11-17 DIAGNOSIS — N186 End stage renal disease: Secondary | ICD-10-CM | POA: Diagnosis not present

## 2022-11-17 DIAGNOSIS — D509 Iron deficiency anemia, unspecified: Secondary | ICD-10-CM | POA: Diagnosis not present

## 2022-11-17 DIAGNOSIS — N2581 Secondary hyperparathyroidism of renal origin: Secondary | ICD-10-CM | POA: Diagnosis not present

## 2022-11-17 DIAGNOSIS — D631 Anemia in chronic kidney disease: Secondary | ICD-10-CM | POA: Diagnosis not present

## 2022-11-17 DIAGNOSIS — D688 Other specified coagulation defects: Secondary | ICD-10-CM | POA: Diagnosis not present

## 2022-11-17 DIAGNOSIS — Z992 Dependence on renal dialysis: Secondary | ICD-10-CM | POA: Diagnosis not present

## 2022-11-21 DIAGNOSIS — D688 Other specified coagulation defects: Secondary | ICD-10-CM | POA: Diagnosis not present

## 2022-11-21 DIAGNOSIS — N186 End stage renal disease: Secondary | ICD-10-CM | POA: Diagnosis not present

## 2022-11-21 DIAGNOSIS — Z992 Dependence on renal dialysis: Secondary | ICD-10-CM | POA: Diagnosis not present

## 2022-11-21 DIAGNOSIS — N2581 Secondary hyperparathyroidism of renal origin: Secondary | ICD-10-CM | POA: Diagnosis not present

## 2022-11-21 DIAGNOSIS — D509 Iron deficiency anemia, unspecified: Secondary | ICD-10-CM | POA: Diagnosis not present

## 2022-11-21 DIAGNOSIS — D631 Anemia in chronic kidney disease: Secondary | ICD-10-CM | POA: Diagnosis not present

## 2022-11-24 ENCOUNTER — Ambulatory Visit: Payer: Medicare Other | Admitting: Internal Medicine

## 2022-11-24 DIAGNOSIS — D509 Iron deficiency anemia, unspecified: Secondary | ICD-10-CM | POA: Diagnosis not present

## 2022-11-24 DIAGNOSIS — D688 Other specified coagulation defects: Secondary | ICD-10-CM | POA: Diagnosis not present

## 2022-11-24 DIAGNOSIS — N2581 Secondary hyperparathyroidism of renal origin: Secondary | ICD-10-CM | POA: Diagnosis not present

## 2022-11-24 DIAGNOSIS — D631 Anemia in chronic kidney disease: Secondary | ICD-10-CM | POA: Diagnosis not present

## 2022-11-24 DIAGNOSIS — Z992 Dependence on renal dialysis: Secondary | ICD-10-CM | POA: Diagnosis not present

## 2022-11-24 DIAGNOSIS — N186 End stage renal disease: Secondary | ICD-10-CM | POA: Diagnosis not present

## 2022-11-25 ENCOUNTER — Encounter: Payer: Self-pay | Admitting: Emergency Medicine

## 2022-11-25 ENCOUNTER — Ambulatory Visit (INDEPENDENT_AMBULATORY_CARE_PROVIDER_SITE_OTHER)
Admission: RE | Admit: 2022-11-25 | Discharge: 2022-11-25 | Disposition: A | Discharge status: Home / Self Care | Payer: Medicare Other | Source: Ambulatory Visit | Attending: Emergency Medicine | Admitting: Emergency Medicine

## 2022-11-25 ENCOUNTER — Ambulatory Visit (INDEPENDENT_AMBULATORY_CARE_PROVIDER_SITE_OTHER): Payer: Medicare Other | Admitting: Emergency Medicine

## 2022-11-25 VITALS — BP 138/84 | HR 67 | Temp 98.6°F | Ht 71.0 in | Wt 171.5 lb

## 2022-11-25 DIAGNOSIS — M25512 Pain in left shoulder: Secondary | ICD-10-CM | POA: Diagnosis not present

## 2022-11-25 MED ORDER — MELOXICAM 15 MG PO TABS: 15.0000 mg | ORAL_TABLET | Freq: Every day | ORAL | 0 refills | Status: DC

## 2022-11-25 NOTE — Progress Notes (Signed)
[name] 72 y.o.   Chief Complaint  Patient presents with   Pain    Pain in shoulder ( left ) x 2 weeks, patient states he slept wrong and the pain has not went away. OTC pain meds doesn't work     HISTORY OF PRESENT ILLNESS: Acute problem visit today.  Patient of Dr. Sanda Linger This is a 72 y.o. male complaining of left shoulder pain that started about 2 weeks ago.  May have slept on it the wrong way Tylenol over-the-counter not working for pain Denies any other injuries.  No other associated symptoms. No other complaints or medical concerns today.  HPI   Prior to Admission medications   Medication Sig Start Date End Date Taking? Authorizing Provider  ACCU-CHEK SOFTCLIX LANCETS lancets 1 each by Other route 2 (two) times daily. Use to check blood sugars twice a day 03/04/17  Yes Etta Grandchild, MD  acetaminophen (TYLENOL) 500 MG tablet Take 1,000 mg by mouth every 8 (eight) hours as needed for moderate pain.   Yes [provider]  amLODipine (NORVASC) 5 MG tablet Take 5 mg by mouth daily.   Yes [provider]  carvedilol (COREG) 25 MG tablet Take 25 mg by mouth 2 (two) times daily with a meal.   Yes [provider]  ezetimibe-simvastatin (VYTORIN) 10-10 MG tablet Take 1 tablet by mouth at bedtime. 12/27/21  Yes Etta Grandchild, MD  glucose blood test strip Use TID 02/25/17  Yes Etta Grandchild, MD  hydrALAZINE (APRESOLINE) 25 MG tablet Take 25 mg by mouth 2 (two) times daily. 09/01/22  Yes [provider]  LOKELMA 10 g PACK packet Take 10 g by mouth 2 (two) times a week. 07/01/22  Yes [provider]  sevelamer carbonate (RENVELA) 800 MG tablet Take 1,600 mg by mouth 3 (three) times daily with meals. 04/01/21  Yes [provider]  sildenafil (REVATIO) 20 MG tablet Take 1 tablet (20 mg total) by mouth daily. 12/26/21  Yes Etta Grandchild, MD  Blood Glucose Monitoring Suppl (ACCU-CHEK AVIVA PLUS) w/Device KIT Use as directed  to check blood sugars 03/04/17   Etta Grandchild, MD  losartan (COZAAR) 25 MG tablet Take 25 mg by mouth in the morning and at bedtime. Patient not taking: Reported on 11/25/2022 12/10/20   [provider]  VELPHORO 500 MG chewable tablet Chew 1,000 mg by mouth 3 (three) times daily. Patient not taking: Reported on 11/25/2022 08/18/22   [provider]    No Known Allergies  Patient Active Problem List   Diagnosis Date Noted   Acute on chronic diastolic CHF (congestive heart failure) 10/05/2022   Acute respiratory failure with hypoxia 10/05/2022   CHF (congestive heart failure) 10/05/2022   Need for prophylactic vaccination with combined diphtheria-tetanus-pertussis (DTP) vaccine 12/26/2021   Need for prophylactic vaccination and inoculation against varicella 12/26/2021   PAD (peripheral artery disease) 03/12/2021   Prostate cancer screening 03/12/2021   Immunosuppression due to drug therapy 09/16/2020   Dyslipidemia, goal LDL below 100 04/18/2020   Acquired ureteral stricture 11/11/2017   Type 2 diabetes mellitus with complication, without long-term current use of insulin 05/07/2017   Anemia in chronic kidney disease 02/06/2014   End stage renal disease 01/16/2012   ED (erectile dysfunction) 07/30/2011   Routine general medical examination at a health care facility 07/30/2011   Kidney transplant status, cadaveric 01/30/2011   Hypertension 10/16/2010    Past Medical History:  Diagnosis Date  Anemia    Anemia    Benign colon polyp 08/11/2010   Clot    STATED TOLD IN APRIL CLOT TO LEFT SHOULDER. 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   HTN (hypertension)    Hyperlipidemia    Renal failure     Past Surgical History:  Procedure Laterality Date   AV FISTULA PLACEMENT  01/19/2012   Procedure: ARTERIOVENOUS (AV) FISTULA CREATION;   Surgeon: Fransisco Hertz, MD;  Location[facility] OR;  Service: Vascular;  Laterality: Right;  Right Brachio-cephalic arteriovenous fistula   AV FISTULA PLACEMENT Right 07/18/2021   Procedure: INSERTION OF RIGHT UPPER EXTREMITY ARTERIOVENOUS (AV) GORE-TEX GRAFT;  Surgeon: Leonie Douglas, MD;  Location: [facility] OR;  Service: Vascular;  Laterality: Right;  PERIPHERAL NERVE BLOCK   AV FISTULA PLACEMENT, RADIOCEPHALIC  12/31/10   Right arm   INSERTION OF DIALYSIS CATHETER  01/19/2012   Procedure: INSERTION OF DIALYSIS CATHETER;  Surgeon: Fransisco Hertz, MD;  Location: [facility] OR;  Service: Vascular;  Laterality: N/A;  right internal jugular vein   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   UPPER EXTREMITY VENOGRAPHY Right 06/11/2021   Procedure: UPPER EXTREMITY VENOGRAPHY;  Surgeon: Nada Libman, MD;  Location: [facility] INVASIVE CV LAB;  Service: Cardiovascular;  Laterality: Right;   VENOGRAM Left 05/30/2021   Procedure: DIAGNOSTIC CENTRAL VENOGRAM;  Surgeon: Victorino Sparrow, MD;  Location: [facility] OR;  Service: Vascular;  Laterality: Left;    Social History   Socioeconomic History   Marital status: Married    Spouse name: Not on file   Number of children: 5   Years of education: Not on file   Highest education level: Not on file  Occupational History   Occupation: PhD Garment/textile technologist: [employer]  Tobacco Use   Smoking status: Never   Smokeless tobacco: Never  Vaping Use   Vaping Use: Never used  Substance and Sexual Activity   Alcohol use: No    Comment: 1 drink a month or non   Drug use: No   Sexual activity: Yes    Birth control/protection: Condom  Other Topics Concern   Not on file  Social History Narrative   Regular Exercise -  YES         Social Determinants of Health   Financial Resource Strain: Not on file  Food Insecurity: Not on file  Transportation Needs: Not on file  Physical Activity: Not on file  Stress: Not on file  Social Connections: Not on file  Intimate Partner Violence: Not At  Risk (10/05/2022)   Humiliation, Afraid, Rape, and Kick questionnaire    Fear of Current or Ex-Partner: No    Emotionally Abused: No    Physically Abused: No    Sexually Abused: No    Family History  Problem Relation Age of Onset   Diabetes Father    Colon cancer Neg Hx    Esophageal cancer Neg Hx    Stomach cancer Neg Hx    Rectal cancer Neg Hx      Review of Systems  Constitutional: Negative.  Negative for chills and fever.  HENT: Negative.  Negative for congestion and sore throat.   Respiratory: Negative.  Negative for cough and shortness of breath.   Cardiovascular: Negative.  Negative for chest pain and palpitations.  Gastrointestinal:  Negative for abdominal  pain, diarrhea, nausea and vomiting.  Musculoskeletal:  Positive for joint pain (Left shoulder).  Skin: Negative.  Negative for rash.  Neurological: Negative.  Negative for dizziness and headaches.  All other systems reviewed and are negative.   Vitals:   11/25/22 0921  BP: 138/84  Pulse: 67  Temp: 98.6 F (37 C)  SpO2: 93%    Physical Exam Vitals reviewed.  Constitutional:      Appearance: Normal appearance.  HENT:     Head: Normocephalic.  Eyes:     Extraocular Movements: Extraocular movements intact.  Cardiovascular:     Rate and Rhythm: Normal rate.  Pulmonary:     Effort: Pulmonary effort is normal.  Musculoskeletal:     Comments: Left shoulder: Limited range of motion due to pain.  No localized tenderness or swelling. Left upper arm: Neurovascularly intact.  Skin:    General: Skin is warm and dry.  Neurological:     Mental Status: He is alert and oriented to person, place, and time.  Psychiatric:        Mood and Affect: Mood normal.        Behavior: Behavior normal.    DG Shoulder Left  Result Date: 11/25/2022 CLINICAL DATA:  Shoulder pain for 2-3 weeks EXAM: LEFT SHOULDER - 3 VIEW COMPARISON:  None Available. FINDINGS: There is no evidence of fracture or dislocation. There is no  evidence of arthropathy or other focal bone abnormality. Soft tissues are unremarkable. IMPRESSION: No acute osseous abnormality Electronically Signed   By: Karen Kays M.D.   On: 11/25/2022 10:48     ASSESSMENT & PLAN: Problem List Items Addressed This Visit       Other   Acute pain of left shoulder - Primary    Clinically stable. Differential diagnosis discussed.  Most likely bursitis. Recommend to start meloxicam 15 mg daily for 10 days X-ray done today.  Report reviewed. Pain management discussed. May need orthopedic evaluation if no better after 10 days      Relevant Medications   meloxicam (MOBIC) 15 MG tablet   Other Relevant Orders   DG Shoulder Left   Patient Instructions  Shoulder Pain Many things can cause shoulder pain, including: An injury. Moving the shoulder in the same way again and again (overuse). Joint pain (arthritis). Pain can come from: Swelling and irritation (inflammation) of any part of the shoulder. An injury to: The shoulder joint. Tissues that connect muscle to bone (tendons). Tissues that connect bones to each other (ligaments). Bones. Follow these instructions at home: Watch for changes in your symptoms. Let your doctor know about them. Follow these instructions to help with your pain. If you have a sling that can be taken off: Wear the sling as told by your doctor. Take it off only as told by your doctor. Check the skin around the sling every day. Tell your doctor if you see problems. Loosen the sling if your fingers: Tingle. Become numb. Become cold. Keep the sling clean. If the sling is not waterproof: Do not let it get wet. Take the sling off when you shower or bathe. Managing pain, stiffness, and swelling  If told, put ice on the painful area. Put ice in a plastic bag. Place a towel between your skin and the bag. Leave the ice on for 20 minutes, 2-3 times a day. Stop putting ice on if it does not help with the pain. If your  skin turns bright red, take off the ice right away to prevent  skin damage. The risk of damage is higher if you cannot feel pain, heat, or cold. Squeeze a soft ball or a foam pad as much as possible. This prevents swelling in the shoulder. It also helps to strengthen the arm. General instructions Take over-the-counter and prescription medicines only as told by your doctor. Keep all follow-up visits. This will help you avoid any type of permanent shoulder problems. Contact a doctor if: Your pain gets worse. Medicine does not help your pain. You have new pain in your arm, hand, or fingers. You loosen your sling and your arm, hand, or fingers: Tingle. Are numb. Are swollen. Get help right away if: Your arm, hand, or fingers turn white or blue. This information is not intended to replace advice given to you by your health care provider. Make sure you discuss any questions you have with your health care provider. Document Revised: 02/28/2022 Document Reviewed: 02/28/2022 Elsevier Patient Education  2023 Elsevier Inc.    Edwina Barth, MD [facility]

## 2022-11-25 NOTE — Patient Instructions (Signed)
Shoulder Pain Many things can cause shoulder pain, including: An injury. Moving the shoulder in the same way again and again (overuse). Joint pain (arthritis). Pain can come from: Swelling and irritation (inflammation) of any part of the shoulder. An injury to: The shoulder joint. Tissues that connect muscle to bone (tendons). Tissues that connect bones to each other (ligaments). Bones. Follow these instructions at home: Watch for changes in your symptoms. Let your doctor know about them. Follow these instructions to help with your pain. If you have a sling that can be taken off: Wear the sling as told by your doctor. Take it off only as told by your doctor. Check the skin around the sling every day. Tell your doctor if you see problems. Loosen the sling if your fingers: Tingle. Become numb. Become cold. Keep the sling clean. If the sling is not waterproof: Do not let it get wet. Take the sling off when you shower or bathe. Managing pain, stiffness, and swelling  If told, put ice on the painful area. Put ice in a plastic bag. Place a towel between your skin and the bag. Leave the ice on for 20 minutes, 2-3 times a day. Stop putting ice on if it does not help with the pain. If your skin turns bright red, take off the ice right away to prevent skin damage. The risk of damage is higher if you cannot feel pain, heat, or cold. Squeeze a soft ball or a foam pad as much as possible. This prevents swelling in the shoulder. It also helps to strengthen the arm. General instructions Take over-the-counter and prescription medicines only as told by your doctor. Keep all follow-up visits. This will help you avoid any type of permanent shoulder problems. Contact a doctor if: Your pain gets worse. Medicine does not help your pain. You have new pain in your arm, hand, or fingers. You loosen your sling and your arm, hand, or fingers: Tingle. Are numb. Are swollen. Get help right away  if: Your arm, hand, or fingers turn white or blue. This information is not intended to replace advice given to you by your health care provider. Make sure you discuss any questions you have with your health care provider. Document Revised: 02/28/2022 Document Reviewed: 02/28/2022 Elsevier Patient Education  2023 Elsevier Inc.  

## 2022-11-25 NOTE — Assessment & Plan Note (Signed)
Clinically stable. Differential diagnosis discussed.  Most likely bursitis. Recommend to start meloxicam 15 mg daily for 10 days X-ray done today.  Report reviewed. Pain management discussed. May need orthopedic evaluation if no better after 10 days

## 2022-11-28 DIAGNOSIS — D688 Other specified coagulation defects: Secondary | ICD-10-CM | POA: Diagnosis not present

## 2022-11-28 DIAGNOSIS — D509 Iron deficiency anemia, unspecified: Secondary | ICD-10-CM | POA: Diagnosis not present

## 2022-11-28 DIAGNOSIS — N2581 Secondary hyperparathyroidism of renal origin: Secondary | ICD-10-CM | POA: Diagnosis not present

## 2022-11-28 DIAGNOSIS — D631 Anemia in chronic kidney disease: Secondary | ICD-10-CM | POA: Diagnosis not present

## 2022-11-28 DIAGNOSIS — Z992 Dependence on renal dialysis: Secondary | ICD-10-CM | POA: Diagnosis not present

## 2022-11-28 DIAGNOSIS — N186 End stage renal disease: Secondary | ICD-10-CM | POA: Diagnosis not present

## 2022-12-01 DIAGNOSIS — D688 Other specified coagulation defects: Secondary | ICD-10-CM | POA: Diagnosis not present

## 2022-12-01 DIAGNOSIS — D509 Iron deficiency anemia, unspecified: Secondary | ICD-10-CM | POA: Diagnosis not present

## 2022-12-01 DIAGNOSIS — N2581 Secondary hyperparathyroidism of renal origin: Secondary | ICD-10-CM | POA: Diagnosis not present

## 2022-12-01 DIAGNOSIS — Z992 Dependence on renal dialysis: Secondary | ICD-10-CM | POA: Diagnosis not present

## 2022-12-01 DIAGNOSIS — N186 End stage renal disease: Secondary | ICD-10-CM | POA: Diagnosis not present

## 2022-12-01 DIAGNOSIS — D631 Anemia in chronic kidney disease: Secondary | ICD-10-CM | POA: Diagnosis not present

## 2022-12-05 DIAGNOSIS — D631 Anemia in chronic kidney disease: Secondary | ICD-10-CM | POA: Diagnosis not present

## 2022-12-05 DIAGNOSIS — D509 Iron deficiency anemia, unspecified: Secondary | ICD-10-CM | POA: Diagnosis not present

## 2022-12-05 DIAGNOSIS — Z992 Dependence on renal dialysis: Secondary | ICD-10-CM | POA: Diagnosis not present

## 2022-12-05 DIAGNOSIS — D688 Other specified coagulation defects: Secondary | ICD-10-CM | POA: Diagnosis not present

## 2022-12-05 DIAGNOSIS — N2581 Secondary hyperparathyroidism of renal origin: Secondary | ICD-10-CM | POA: Diagnosis not present

## 2022-12-05 DIAGNOSIS — N186 End stage renal disease: Secondary | ICD-10-CM | POA: Diagnosis not present

## 2022-12-08 DIAGNOSIS — D631 Anemia in chronic kidney disease: Secondary | ICD-10-CM | POA: Diagnosis not present

## 2022-12-08 DIAGNOSIS — D509 Iron deficiency anemia, unspecified: Secondary | ICD-10-CM | POA: Diagnosis not present

## 2022-12-08 DIAGNOSIS — D688 Other specified coagulation defects: Secondary | ICD-10-CM | POA: Diagnosis not present

## 2022-12-08 DIAGNOSIS — N2581 Secondary hyperparathyroidism of renal origin: Secondary | ICD-10-CM | POA: Diagnosis not present

## 2022-12-08 DIAGNOSIS — N186 End stage renal disease: Secondary | ICD-10-CM | POA: Diagnosis not present

## 2022-12-08 DIAGNOSIS — Z992 Dependence on renal dialysis: Secondary | ICD-10-CM | POA: Diagnosis not present

## 2022-12-09 DIAGNOSIS — N186 End stage renal disease: Secondary | ICD-10-CM | POA: Diagnosis not present

## 2022-12-09 DIAGNOSIS — T8610 Unspecified complication of kidney transplant: Secondary | ICD-10-CM | POA: Diagnosis not present

## 2022-12-09 DIAGNOSIS — Z992 Dependence on renal dialysis: Secondary | ICD-10-CM | POA: Diagnosis not present

## 2022-12-12 DIAGNOSIS — N186 End stage renal disease: Secondary | ICD-10-CM | POA: Diagnosis not present

## 2022-12-12 DIAGNOSIS — D631 Anemia in chronic kidney disease: Secondary | ICD-10-CM | POA: Diagnosis not present

## 2022-12-12 DIAGNOSIS — D688 Other specified coagulation defects: Secondary | ICD-10-CM | POA: Diagnosis not present

## 2022-12-12 DIAGNOSIS — N2581 Secondary hyperparathyroidism of renal origin: Secondary | ICD-10-CM | POA: Diagnosis not present

## 2022-12-12 DIAGNOSIS — Z992 Dependence on renal dialysis: Secondary | ICD-10-CM | POA: Diagnosis not present

## 2022-12-15 DIAGNOSIS — D631 Anemia in chronic kidney disease: Secondary | ICD-10-CM | POA: Diagnosis not present

## 2022-12-15 DIAGNOSIS — D688 Other specified coagulation defects: Secondary | ICD-10-CM | POA: Diagnosis not present

## 2022-12-15 DIAGNOSIS — N2581 Secondary hyperparathyroidism of renal origin: Secondary | ICD-10-CM | POA: Diagnosis not present

## 2022-12-15 DIAGNOSIS — Z992 Dependence on renal dialysis: Secondary | ICD-10-CM | POA: Diagnosis not present

## 2022-12-15 DIAGNOSIS — N186 End stage renal disease: Secondary | ICD-10-CM | POA: Diagnosis not present

## 2022-12-19 DIAGNOSIS — Z992 Dependence on renal dialysis: Secondary | ICD-10-CM | POA: Diagnosis not present

## 2022-12-19 DIAGNOSIS — D631 Anemia in chronic kidney disease: Secondary | ICD-10-CM | POA: Diagnosis not present

## 2022-12-19 DIAGNOSIS — N186 End stage renal disease: Secondary | ICD-10-CM | POA: Diagnosis not present

## 2022-12-19 DIAGNOSIS — N2581 Secondary hyperparathyroidism of renal origin: Secondary | ICD-10-CM | POA: Diagnosis not present

## 2022-12-19 DIAGNOSIS — D688 Other specified coagulation defects: Secondary | ICD-10-CM | POA: Diagnosis not present

## 2022-12-20 ENCOUNTER — Other Ambulatory Visit: Payer: Self-pay | Admitting: Emergency Medicine

## 2022-12-20 DIAGNOSIS — M25512 Pain in left shoulder: Secondary | ICD-10-CM

## 2022-12-22 DIAGNOSIS — Z992 Dependence on renal dialysis: Secondary | ICD-10-CM | POA: Diagnosis not present

## 2022-12-22 DIAGNOSIS — N2581 Secondary hyperparathyroidism of renal origin: Secondary | ICD-10-CM | POA: Diagnosis not present

## 2022-12-22 DIAGNOSIS — D688 Other specified coagulation defects: Secondary | ICD-10-CM | POA: Diagnosis not present

## 2022-12-22 DIAGNOSIS — D631 Anemia in chronic kidney disease: Secondary | ICD-10-CM | POA: Diagnosis not present

## 2022-12-22 DIAGNOSIS — N186 End stage renal disease: Secondary | ICD-10-CM | POA: Diagnosis not present

## 2022-12-26 DIAGNOSIS — N2581 Secondary hyperparathyroidism of renal origin: Secondary | ICD-10-CM | POA: Diagnosis not present

## 2022-12-26 DIAGNOSIS — D688 Other specified coagulation defects: Secondary | ICD-10-CM | POA: Diagnosis not present

## 2022-12-26 DIAGNOSIS — D631 Anemia in chronic kidney disease: Secondary | ICD-10-CM | POA: Diagnosis not present

## 2022-12-26 DIAGNOSIS — Z992 Dependence on renal dialysis: Secondary | ICD-10-CM | POA: Diagnosis not present

## 2022-12-26 DIAGNOSIS — N186 End stage renal disease: Secondary | ICD-10-CM | POA: Diagnosis not present

## 2022-12-29 DIAGNOSIS — N2581 Secondary hyperparathyroidism of renal origin: Secondary | ICD-10-CM | POA: Diagnosis not present

## 2022-12-29 DIAGNOSIS — D631 Anemia in chronic kidney disease: Secondary | ICD-10-CM | POA: Diagnosis not present

## 2022-12-29 DIAGNOSIS — D688 Other specified coagulation defects: Secondary | ICD-10-CM | POA: Diagnosis not present

## 2022-12-29 DIAGNOSIS — Z992 Dependence on renal dialysis: Secondary | ICD-10-CM | POA: Diagnosis not present

## 2022-12-29 DIAGNOSIS — N186 End stage renal disease: Secondary | ICD-10-CM | POA: Diagnosis not present

## 2023-01-02 DIAGNOSIS — N2581 Secondary hyperparathyroidism of renal origin: Secondary | ICD-10-CM | POA: Diagnosis not present

## 2023-01-02 DIAGNOSIS — D631 Anemia in chronic kidney disease: Secondary | ICD-10-CM | POA: Diagnosis not present

## 2023-01-02 DIAGNOSIS — N186 End stage renal disease: Secondary | ICD-10-CM | POA: Diagnosis not present

## 2023-01-02 DIAGNOSIS — Z992 Dependence on renal dialysis: Secondary | ICD-10-CM | POA: Diagnosis not present

## 2023-01-02 DIAGNOSIS — D688 Other specified coagulation defects: Secondary | ICD-10-CM | POA: Diagnosis not present

## 2023-01-05 DIAGNOSIS — D688 Other specified coagulation defects: Secondary | ICD-10-CM | POA: Diagnosis not present

## 2023-01-05 DIAGNOSIS — N186 End stage renal disease: Secondary | ICD-10-CM | POA: Diagnosis not present

## 2023-01-05 DIAGNOSIS — N2581 Secondary hyperparathyroidism of renal origin: Secondary | ICD-10-CM | POA: Diagnosis not present

## 2023-01-05 DIAGNOSIS — D631 Anemia in chronic kidney disease: Secondary | ICD-10-CM | POA: Diagnosis not present

## 2023-01-05 DIAGNOSIS — Z992 Dependence on renal dialysis: Secondary | ICD-10-CM | POA: Diagnosis not present

## 2023-01-09 DIAGNOSIS — N2581 Secondary hyperparathyroidism of renal origin: Secondary | ICD-10-CM | POA: Diagnosis not present

## 2023-01-09 DIAGNOSIS — Z992 Dependence on renal dialysis: Secondary | ICD-10-CM | POA: Diagnosis not present

## 2023-01-09 DIAGNOSIS — D688 Other specified coagulation defects: Secondary | ICD-10-CM | POA: Diagnosis not present

## 2023-01-09 DIAGNOSIS — D631 Anemia in chronic kidney disease: Secondary | ICD-10-CM | POA: Diagnosis not present

## 2023-01-09 DIAGNOSIS — N186 End stage renal disease: Secondary | ICD-10-CM | POA: Diagnosis not present

## 2023-01-09 DIAGNOSIS — T8610 Unspecified complication of kidney transplant: Secondary | ICD-10-CM | POA: Diagnosis not present

## 2023-01-12 DIAGNOSIS — D688 Other specified coagulation defects: Secondary | ICD-10-CM | POA: Diagnosis not present

## 2023-01-12 DIAGNOSIS — N186 End stage renal disease: Secondary | ICD-10-CM | POA: Diagnosis not present

## 2023-01-12 DIAGNOSIS — Z992 Dependence on renal dialysis: Secondary | ICD-10-CM | POA: Diagnosis not present

## 2023-01-12 DIAGNOSIS — N2581 Secondary hyperparathyroidism of renal origin: Secondary | ICD-10-CM | POA: Diagnosis not present

## 2023-01-12 DIAGNOSIS — D509 Iron deficiency anemia, unspecified: Secondary | ICD-10-CM | POA: Diagnosis not present

## 2023-01-16 DIAGNOSIS — D509 Iron deficiency anemia, unspecified: Secondary | ICD-10-CM | POA: Diagnosis not present

## 2023-01-16 DIAGNOSIS — D688 Other specified coagulation defects: Secondary | ICD-10-CM | POA: Diagnosis not present

## 2023-01-16 DIAGNOSIS — Z992 Dependence on renal dialysis: Secondary | ICD-10-CM | POA: Diagnosis not present

## 2023-01-16 DIAGNOSIS — N186 End stage renal disease: Secondary | ICD-10-CM | POA: Diagnosis not present

## 2023-01-16 DIAGNOSIS — N2581 Secondary hyperparathyroidism of renal origin: Secondary | ICD-10-CM | POA: Diagnosis not present

## 2023-01-19 DIAGNOSIS — Z992 Dependence on renal dialysis: Secondary | ICD-10-CM | POA: Diagnosis not present

## 2023-01-19 DIAGNOSIS — D509 Iron deficiency anemia, unspecified: Secondary | ICD-10-CM | POA: Diagnosis not present

## 2023-01-19 DIAGNOSIS — N2581 Secondary hyperparathyroidism of renal origin: Secondary | ICD-10-CM | POA: Diagnosis not present

## 2023-01-19 DIAGNOSIS — D688 Other specified coagulation defects: Secondary | ICD-10-CM | POA: Diagnosis not present

## 2023-01-19 DIAGNOSIS — N186 End stage renal disease: Secondary | ICD-10-CM | POA: Diagnosis not present

## 2023-01-23 DIAGNOSIS — N2581 Secondary hyperparathyroidism of renal origin: Secondary | ICD-10-CM | POA: Diagnosis not present

## 2023-01-23 DIAGNOSIS — Z992 Dependence on renal dialysis: Secondary | ICD-10-CM | POA: Diagnosis not present

## 2023-01-23 DIAGNOSIS — D509 Iron deficiency anemia, unspecified: Secondary | ICD-10-CM | POA: Diagnosis not present

## 2023-01-23 DIAGNOSIS — D688 Other specified coagulation defects: Secondary | ICD-10-CM | POA: Diagnosis not present

## 2023-01-23 DIAGNOSIS — N186 End stage renal disease: Secondary | ICD-10-CM | POA: Diagnosis not present

## 2023-01-24 ENCOUNTER — Other Ambulatory Visit: Payer: Self-pay | Admitting: Internal Medicine

## 2023-01-24 DIAGNOSIS — N5201 Erectile dysfunction due to arterial insufficiency: Secondary | ICD-10-CM

## 2023-01-26 ENCOUNTER — Other Ambulatory Visit: Payer: Self-pay | Admitting: Internal Medicine

## 2023-01-26 DIAGNOSIS — N186 End stage renal disease: Secondary | ICD-10-CM | POA: Diagnosis not present

## 2023-01-26 DIAGNOSIS — D688 Other specified coagulation defects: Secondary | ICD-10-CM | POA: Diagnosis not present

## 2023-01-26 DIAGNOSIS — Z992 Dependence on renal dialysis: Secondary | ICD-10-CM | POA: Diagnosis not present

## 2023-01-26 DIAGNOSIS — D509 Iron deficiency anemia, unspecified: Secondary | ICD-10-CM | POA: Diagnosis not present

## 2023-01-26 DIAGNOSIS — N2581 Secondary hyperparathyroidism of renal origin: Secondary | ICD-10-CM | POA: Diagnosis not present

## 2023-01-30 DIAGNOSIS — D509 Iron deficiency anemia, unspecified: Secondary | ICD-10-CM | POA: Diagnosis not present

## 2023-01-30 DIAGNOSIS — D688 Other specified coagulation defects: Secondary | ICD-10-CM | POA: Diagnosis not present

## 2023-01-30 DIAGNOSIS — Z992 Dependence on renal dialysis: Secondary | ICD-10-CM | POA: Diagnosis not present

## 2023-01-30 DIAGNOSIS — N186 End stage renal disease: Secondary | ICD-10-CM | POA: Diagnosis not present

## 2023-01-30 DIAGNOSIS — N2581 Secondary hyperparathyroidism of renal origin: Secondary | ICD-10-CM | POA: Diagnosis not present

## 2023-01-30 IMAGING — DX DG CHEST 2V
2 series · 2 of 2 positions shown · non-contrast
Comparison: Two-view chest x-ray 05/07/2017

CLINICAL DATA: Productive cough for 3 weeks.

EXAM:
CHEST - 2 VIEW

[chest pa]
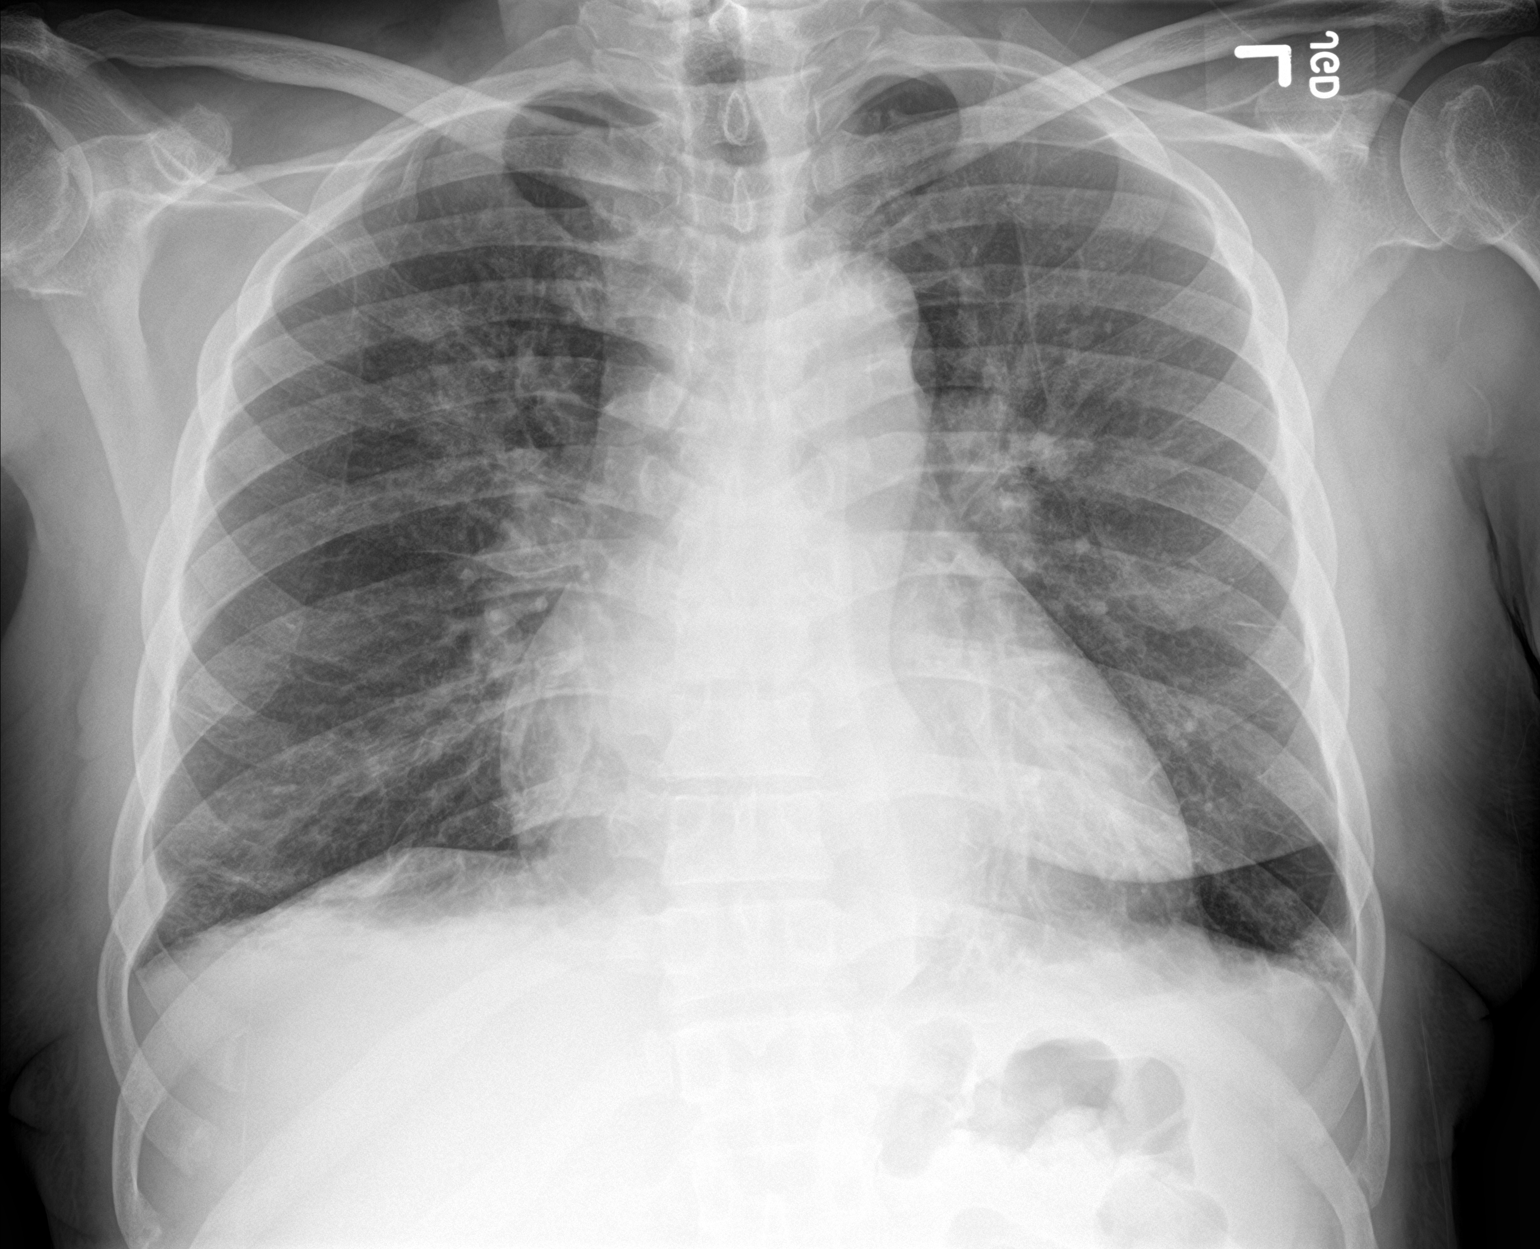

[chest lat]
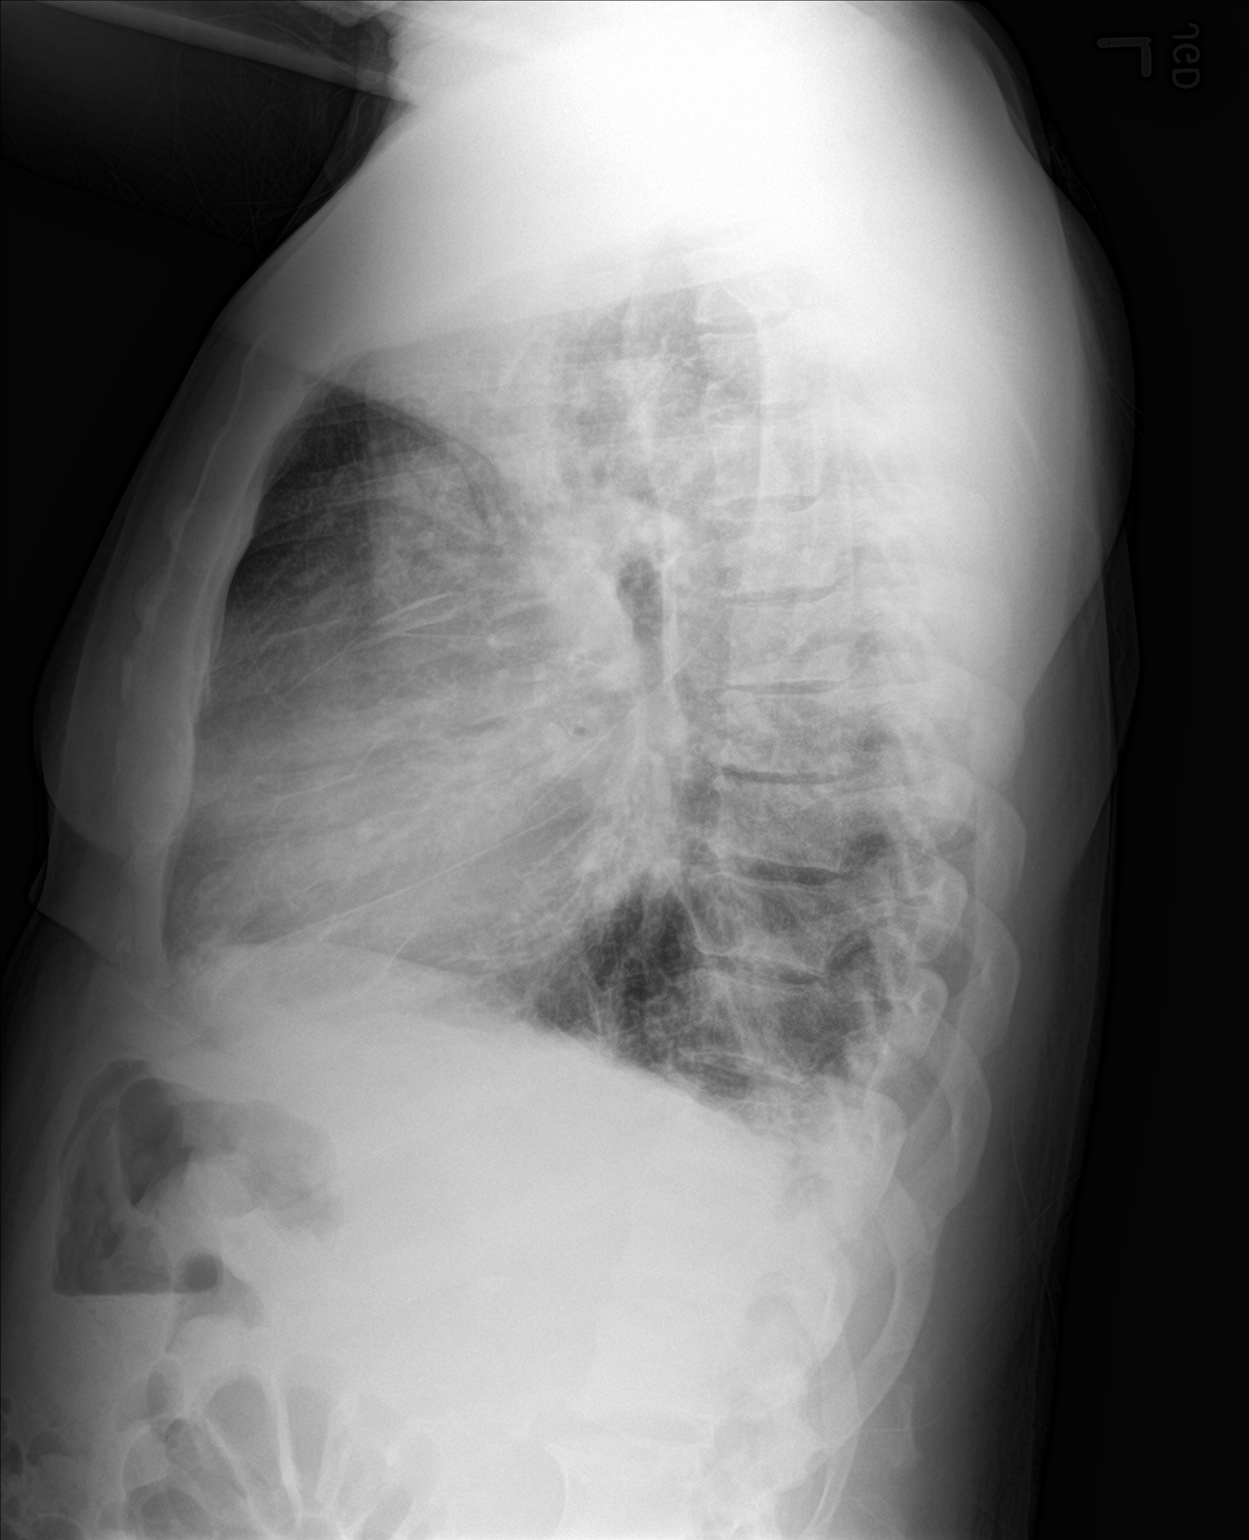

[2 of 2 positions shown; findings below may reference images not displayed]

FINDINGS: Heart size is upper limits of normal. Perihilar fullness is present
bilaterally. Patchy airspace disease is present at the left greater
than right base. Bilateral effusions are present. Right suprahilar
airspace opacities are noted as well.
IMPRESSION: 1. Patchy bibasilar airspace disease, left greater than right,
concerning for pneumonia.
2. Right upper lobe airspace disease is also concerning for
pneumonia.
3. Bilateral pleural effusions.

## 2023-02-02 DIAGNOSIS — Z992 Dependence on renal dialysis: Secondary | ICD-10-CM | POA: Diagnosis not present

## 2023-02-02 DIAGNOSIS — N186 End stage renal disease: Secondary | ICD-10-CM | POA: Diagnosis not present

## 2023-02-02 DIAGNOSIS — D688 Other specified coagulation defects: Secondary | ICD-10-CM | POA: Diagnosis not present

## 2023-02-02 DIAGNOSIS — D509 Iron deficiency anemia, unspecified: Secondary | ICD-10-CM | POA: Diagnosis not present

## 2023-02-02 DIAGNOSIS — N2581 Secondary hyperparathyroidism of renal origin: Secondary | ICD-10-CM | POA: Diagnosis not present

## 2023-02-06 DIAGNOSIS — D688 Other specified coagulation defects: Secondary | ICD-10-CM | POA: Diagnosis not present

## 2023-02-06 DIAGNOSIS — Z992 Dependence on renal dialysis: Secondary | ICD-10-CM | POA: Diagnosis not present

## 2023-02-06 DIAGNOSIS — D509 Iron deficiency anemia, unspecified: Secondary | ICD-10-CM | POA: Diagnosis not present

## 2023-02-06 DIAGNOSIS — N186 End stage renal disease: Secondary | ICD-10-CM | POA: Diagnosis not present

## 2023-02-06 DIAGNOSIS — N2581 Secondary hyperparathyroidism of renal origin: Secondary | ICD-10-CM | POA: Diagnosis not present

## 2023-02-08 DIAGNOSIS — N186 End stage renal disease: Secondary | ICD-10-CM | POA: Diagnosis not present

## 2023-02-08 DIAGNOSIS — Z992 Dependence on renal dialysis: Secondary | ICD-10-CM | POA: Diagnosis not present

## 2023-02-08 DIAGNOSIS — T8610 Unspecified complication of kidney transplant: Secondary | ICD-10-CM | POA: Diagnosis not present

## 2023-02-09 DIAGNOSIS — Z992 Dependence on renal dialysis: Secondary | ICD-10-CM | POA: Diagnosis not present

## 2023-02-09 DIAGNOSIS — N2581 Secondary hyperparathyroidism of renal origin: Secondary | ICD-10-CM | POA: Diagnosis not present

## 2023-02-09 DIAGNOSIS — D688 Other specified coagulation defects: Secondary | ICD-10-CM | POA: Diagnosis not present

## 2023-02-09 DIAGNOSIS — D631 Anemia in chronic kidney disease: Secondary | ICD-10-CM | POA: Diagnosis not present

## 2023-02-09 DIAGNOSIS — N186 End stage renal disease: Secondary | ICD-10-CM | POA: Diagnosis not present

## 2023-02-13 DIAGNOSIS — D631 Anemia in chronic kidney disease: Secondary | ICD-10-CM | POA: Diagnosis not present

## 2023-02-13 DIAGNOSIS — N2581 Secondary hyperparathyroidism of renal origin: Secondary | ICD-10-CM | POA: Diagnosis not present

## 2023-02-13 DIAGNOSIS — N186 End stage renal disease: Secondary | ICD-10-CM | POA: Diagnosis not present

## 2023-02-13 DIAGNOSIS — Z992 Dependence on renal dialysis: Secondary | ICD-10-CM | POA: Diagnosis not present

## 2023-02-13 DIAGNOSIS — D688 Other specified coagulation defects: Secondary | ICD-10-CM | POA: Diagnosis not present

## 2023-02-16 DIAGNOSIS — D688 Other specified coagulation defects: Secondary | ICD-10-CM | POA: Diagnosis not present

## 2023-02-16 DIAGNOSIS — D631 Anemia in chronic kidney disease: Secondary | ICD-10-CM | POA: Diagnosis not present

## 2023-02-16 DIAGNOSIS — N186 End stage renal disease: Secondary | ICD-10-CM | POA: Diagnosis not present

## 2023-02-16 DIAGNOSIS — Z992 Dependence on renal dialysis: Secondary | ICD-10-CM | POA: Diagnosis not present

## 2023-02-16 DIAGNOSIS — N2581 Secondary hyperparathyroidism of renal origin: Secondary | ICD-10-CM | POA: Diagnosis not present

## 2023-02-20 DIAGNOSIS — N2581 Secondary hyperparathyroidism of renal origin: Secondary | ICD-10-CM | POA: Diagnosis not present

## 2023-02-20 DIAGNOSIS — N186 End stage renal disease: Secondary | ICD-10-CM | POA: Diagnosis not present

## 2023-02-20 DIAGNOSIS — D631 Anemia in chronic kidney disease: Secondary | ICD-10-CM | POA: Diagnosis not present

## 2023-02-20 DIAGNOSIS — Z992 Dependence on renal dialysis: Secondary | ICD-10-CM | POA: Diagnosis not present

## 2023-02-20 DIAGNOSIS — D688 Other specified coagulation defects: Secondary | ICD-10-CM | POA: Diagnosis not present

## 2023-02-23 DIAGNOSIS — Z992 Dependence on renal dialysis: Secondary | ICD-10-CM | POA: Diagnosis not present

## 2023-02-23 DIAGNOSIS — N186 End stage renal disease: Secondary | ICD-10-CM | POA: Diagnosis not present

## 2023-02-23 DIAGNOSIS — D688 Other specified coagulation defects: Secondary | ICD-10-CM | POA: Diagnosis not present

## 2023-02-23 DIAGNOSIS — N2581 Secondary hyperparathyroidism of renal origin: Secondary | ICD-10-CM | POA: Diagnosis not present

## 2023-02-23 DIAGNOSIS — D631 Anemia in chronic kidney disease: Secondary | ICD-10-CM | POA: Diagnosis not present

## 2023-02-27 DIAGNOSIS — Z992 Dependence on renal dialysis: Secondary | ICD-10-CM | POA: Diagnosis not present

## 2023-02-27 DIAGNOSIS — D631 Anemia in chronic kidney disease: Secondary | ICD-10-CM | POA: Diagnosis not present

## 2023-02-27 DIAGNOSIS — N186 End stage renal disease: Secondary | ICD-10-CM | POA: Diagnosis not present

## 2023-02-27 DIAGNOSIS — N2581 Secondary hyperparathyroidism of renal origin: Secondary | ICD-10-CM | POA: Diagnosis not present

## 2023-02-27 DIAGNOSIS — D688 Other specified coagulation defects: Secondary | ICD-10-CM | POA: Diagnosis not present

## 2023-03-02 DIAGNOSIS — Z992 Dependence on renal dialysis: Secondary | ICD-10-CM | POA: Diagnosis not present

## 2023-03-02 DIAGNOSIS — D631 Anemia in chronic kidney disease: Secondary | ICD-10-CM | POA: Diagnosis not present

## 2023-03-02 DIAGNOSIS — D688 Other specified coagulation defects: Secondary | ICD-10-CM | POA: Diagnosis not present

## 2023-03-02 DIAGNOSIS — N186 End stage renal disease: Secondary | ICD-10-CM | POA: Diagnosis not present

## 2023-03-02 DIAGNOSIS — N2581 Secondary hyperparathyroidism of renal origin: Secondary | ICD-10-CM | POA: Diagnosis not present

## 2023-03-06 DIAGNOSIS — Z992 Dependence on renal dialysis: Secondary | ICD-10-CM | POA: Diagnosis not present

## 2023-03-06 DIAGNOSIS — D631 Anemia in chronic kidney disease: Secondary | ICD-10-CM | POA: Diagnosis not present

## 2023-03-06 DIAGNOSIS — N2581 Secondary hyperparathyroidism of renal origin: Secondary | ICD-10-CM | POA: Diagnosis not present

## 2023-03-06 DIAGNOSIS — D688 Other specified coagulation defects: Secondary | ICD-10-CM | POA: Diagnosis not present

## 2023-03-06 DIAGNOSIS — N186 End stage renal disease: Secondary | ICD-10-CM | POA: Diagnosis not present

## 2023-03-09 DIAGNOSIS — D631 Anemia in chronic kidney disease: Secondary | ICD-10-CM | POA: Diagnosis not present

## 2023-03-09 DIAGNOSIS — Z992 Dependence on renal dialysis: Secondary | ICD-10-CM | POA: Diagnosis not present

## 2023-03-09 DIAGNOSIS — D688 Other specified coagulation defects: Secondary | ICD-10-CM | POA: Diagnosis not present

## 2023-03-09 DIAGNOSIS — N186 End stage renal disease: Secondary | ICD-10-CM | POA: Diagnosis not present

## 2023-03-09 DIAGNOSIS — N2581 Secondary hyperparathyroidism of renal origin: Secondary | ICD-10-CM | POA: Diagnosis not present

## 2023-03-11 DIAGNOSIS — Z992 Dependence on renal dialysis: Secondary | ICD-10-CM | POA: Diagnosis not present

## 2023-03-11 DIAGNOSIS — T8610 Unspecified complication of kidney transplant: Secondary | ICD-10-CM | POA: Diagnosis not present

## 2023-03-11 DIAGNOSIS — N186 End stage renal disease: Secondary | ICD-10-CM | POA: Diagnosis not present

## 2023-03-13 DIAGNOSIS — L299 Pruritus, unspecified: Secondary | ICD-10-CM | POA: Insufficient documentation

## 2023-08-27 ENCOUNTER — Encounter: Payer: Self-pay | Admitting: Family Medicine

## 2023-08-27 ENCOUNTER — Ambulatory Visit: Payer: Medicare Other | Admitting: Family Medicine

## 2023-08-27 VITALS — BP 132/80 | HR 62 | Temp 97.6°F | Ht 71.0 in | Wt 165.0 lb

## 2023-08-27 DIAGNOSIS — M25512 Pain in left shoulder: Secondary | ICD-10-CM | POA: Diagnosis not present

## 2023-08-27 DIAGNOSIS — G8929 Other chronic pain: Secondary | ICD-10-CM | POA: Diagnosis not present

## 2023-08-27 NOTE — Progress Notes (Signed)
Subjective:     Patient ID: [name], male    DOB: 08-26-1950, 73 y.o.   MRN: [id number]  Chief Complaint  Patient presents with   Referral    Wants referral to physical therapy for left shoulder pain, was told its arthritis but not sure    HPI   History of Present Illness         C/o persistent left shoulder pain. Pain started in April 2024. Unable to sleep on his left side. No numbness or tingling.  C/o posterior neck pain. Denies injury.   Taking OTC topical analgesics.   Taking Tylenol arthritis.   He had a negative X ray. Thought to be bursitis.   ESRD on dialysis          Health Maintenance Due  Topic Date Due   Medicare Annual Wellness (AWV)  Never done   Zoster Vaccines- Shingrix (1 of 2) Never done   COVID-19 Vaccine (2 - Janssen risk series) 02/17/2020   DTaP/Tdap/Td (2 - Td or Tdap) 10/29/2020   FOOT EXAM  12/28/2022   INFLUENZA VACCINE  03/12/2023   HEMOGLOBIN A1C  04/05/2023   OPHTHALMOLOGY EXAM  07/10/2023    Past Medical History:  Diagnosis Date   Anemia    Anemia    Benign colon polyp 08/11/2010   Clot    STATED TOLD IN [facility] CLOT TO LEFT SHOULDER. 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   HTN (hypertension)    Hyperlipidemia    Renal failure     Past Surgical History:  Procedure Laterality Date   AV FISTULA PLACEMENT  01/19/2012   Procedure: ARTERIOVENOUS (AV) FISTULA CREATION;  Surgeon: Fransisco Hertz, MD;  Location: [facility] OR;  Service: Vascular;  Laterality: Right;  Right Brachio-cephalic arteriovenous fistula   AV FISTULA PLACEMENT Right 07/18/2021   Procedure: INSERTION OF RIGHT UPPER EXTREMITY ARTERIOVENOUS (AV) GORE-TEX GRAFT;  Surgeon: Leonie Douglas, MD;  Location: [facility] OR;  Service: Vascular;  Laterality: Right;  PERIPHERAL NERVE BLOCK   AV FISTULA PLACEMENT, RADIOCEPHALIC  12/31/10   Right  arm   INSERTION OF DIALYSIS CATHETER  01/19/2012   Procedure: INSERTION OF DIALYSIS CATHETER;  Surgeon: Fransisco Hertz, MD;  Location: [facility] OR;  Service: Vascular;  Laterality: N/A;  right internal jugular vein   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   UPPER EXTREMITY VENOGRAPHY Right 06/11/2021   Procedure: UPPER EXTREMITY VENOGRAPHY;  Surgeon: Nada Libman, MD;  Location: [facility] INVASIVE CV LAB;  Service: Cardiovascular;  Laterality: Right;   VENOGRAM Left 05/30/2021   Procedure: DIAGNOSTIC CENTRAL VENOGRAM;  Surgeon: Victorino Sparrow, MD;  Location: [facility] OR;  Service: Vascular;  Laterality: Left;    Family History  Problem Relation Age of Onset   Diabetes Father    Colon cancer Neg Hx    Esophageal cancer Neg Hx    Stomach cancer Neg Hx    Rectal cancer Neg Hx     Social History   Socioeconomic History   Marital status: Married    Spouse name: Not on file   Number of children: 5   Years of education: Not on file   Highest education level: Not on file  Occupational History   Occupation: PhD Garment/textile technologist: [employer]  Tobacco Use   Smoking status: Never   Smokeless  tobacco: Never  Vaping Use   Vaping status: Never Used  Substance and Sexual Activity   Alcohol use: No    Comment: 1 drink a month or non   Drug use: No   Sexual activity: Yes    Birth control/protection: Condom  Other Topics Concern   Not on file  Social History Narrative   Regular Exercise -  YES         Social Drivers of Health   Financial Resource Strain: Not on file  Food Insecurity: Not on file  Transportation Needs: Not on file  Physical Activity: Not on file  Stress: Not on file  Social Connections: Not on file  Intimate Partner Violence: Not At Risk (10/05/2022)   Humiliation, Afraid, Rape, and Kick questionnaire    Fear of Current or Ex-Partner: No    Emotionally Abused: No    Physically Abused: No    Sexually Abused: No    Outpatient Medications Prior to Visit  Medication Sig Dispense  Refill   ACCU-CHEK SOFTCLIX LANCETS lancets 1 each by Other route 2 (two) times daily. Use to check blood sugars twice a day 100 each 3   acetaminophen (TYLENOL) 500 MG tablet Take 1,000 mg by mouth every 8 (eight) hours as needed for moderate pain.     amLODipine (NORVASC) 5 MG tablet Take 5 mg by mouth daily.     Blood Glucose Monitoring Suppl (ACCU-CHEK AVIVA PLUS) w/Device KIT Use as directed to check blood sugars 1 kit 0   carvedilol (COREG) 25 MG tablet Take 25 mg by mouth 2 (two) times daily with a meal.     ezetimibe-simvastatin (VYTORIN) 10-10 MG tablet Take 1 tablet by mouth at bedtime. 90 tablet 1   glucose blood test strip Use TID 100 each 12   hydrALAZINE (APRESOLINE) 25 MG tablet Take 25 mg by mouth 2 (two) times daily.     LOKELMA 10 g PACK packet Take 10 g by mouth 2 (two) times a week.     losartan (COZAAR) 25 MG tablet Take 25 mg by mouth in the morning and at bedtime.     meloxicam (MOBIC) 15 MG tablet TAKE 1 TABLET BY MOUTH ONCE DAILY FOR 10 DAYS 10 tablet 0   sevelamer carbonate (RENVELA) 800 MG tablet Take 1,600 mg by mouth 3 (three) times daily with meals.     sildenafil (REVATIO) 20 MG tablet Take 1 tablet (20 mg total) by mouth daily. 10 tablet 5   VELPHORO 500 MG chewable tablet Chew 1,000 mg by mouth 3 (three) times daily.     No facility-administered medications prior to visit.    No Known Allergies  Review of Systems  Constitutional:  Negative for chills and fever.  Respiratory:  Negative for shortness of breath.   Cardiovascular:  Negative for chest pain and palpitations.  Gastrointestinal:  Negative for nausea and vomiting.  Musculoskeletal:  Positive for joint pain and neck pain. Negative for falls.  Neurological:  Negative for dizziness, tingling, sensory change, focal weakness and headaches.       Objective:    Physical Exam Constitutional:      General: He is not in acute distress.    Appearance: He is not ill-appearing.  Eyes:     Extraocular  Movements: Extraocular movements intact.     Conjunctiva/sclera: Conjunctivae normal.  Cardiovascular:     Rate and Rhythm: Normal rate.  Pulmonary:     Effort: Pulmonary effort is normal.  Musculoskeletal:  Left shoulder: No swelling, tenderness, bony tenderness or crepitus. Decreased range of motion. Normal strength. Normal pulse.     Left upper arm: Normal.     Cervical back: Normal range of motion and neck supple. No tenderness or bony tenderness. Pain with movement present. Normal range of motion.     Thoracic back: Normal.     Lumbar back: Normal.     Comments: Left shoulder with good sensation, ROM and strength. Negative Neers and Hawkins, negative drop arm test. Negative lift off test   Lymphadenopathy:     Cervical: No cervical adenopathy.  Skin:    General: Skin is warm and dry.  Neurological:     General: No focal deficit present.     Mental Status: He is alert and oriented to person, place, and time.     Cranial Nerves: No cranial nerve deficit.     Sensory: No sensory deficit.     Motor: No weakness.     Coordination: Coordination normal.     Gait: Gait normal.  Psychiatric:        Mood and Affect: Mood normal.        Behavior: Behavior normal.        Thought Content: Thought content normal.      BP 132/80 (BP Location: Left Arm, Patient Position: Sitting, Cuff Size: Large)   Pulse 62   Temp 97.6 F (36.4 C) (Temporal)   Ht 5\' 11"  (1.803 m)   Wt 165 lb (74.8 kg)   SpO2 95%   BMI 23.01 kg/m  Wt Readings from Last 3 Encounters:  08/27/23 165 lb (74.8 kg)  11/25/22 171 lb 8 oz (77.8 kg)  10/06/22 167 lb 1.7 oz (75.8 kg)       Assessment & Plan:   Problem List Items Addressed This Visit   None Visit Diagnoses       Chronic left shoulder pain    -  Primary   Relevant Orders   Ambulatory referral to Physical Therapy      He is a patient of Dr. Yetta Barre care today for chronic left shoulder pain.  States he has an appointment at 2 PM today with  [facility] physical therapy and he needs a referral. Reviewed notes from Dr. Alvy Bimler in April 2024 along with negative left shoulder x-ray.  It was recommended that he follow-up with an orthopedist if not improving.  We discussed referral to sports medicine or an orthopedist today and he declines.  States he only wants to see physical therapy.  Referral made.  Encouraged him to follow-up with his PCP for chronic health conditions.  I am having Vevelyn Francois maintain his carvedilol, amLODipine, acetaminophen, glucose blood, Accu-Chek Aviva Plus, Accu-Chek Softclix Lancets, losartan, sevelamer carbonate, sildenafil, ezetimibe-simvastatin, hydrALAZINE, Velphoro, Lokelma, and meloxicam.  No orders of the defined types were placed in this encounter.

## 2023-08-27 NOTE — Patient Instructions (Signed)
I recommend that you schedule a follow up visit with your primary care provider for your chronic health conditions.

## 2023-09-07 ENCOUNTER — Emergency Department (HOSPITAL_COMMUNITY): Payer: Medicare Other

## 2023-09-07 ENCOUNTER — Inpatient Hospital Stay (HOSPITAL_COMMUNITY)
Admission: EM | Admit: 2023-09-07 | Discharge: 2023-09-13 | DRG: 070 | Disposition: A | Discharge status: Home Health | Payer: Medicare Other | Source: Ambulatory Visit | Attending: Family Medicine | Admitting: Family Medicine

## 2023-09-07 ENCOUNTER — Inpatient Hospital Stay (HOSPITAL_COMMUNITY): Payer: Medicare Other

## 2023-09-07 DIAGNOSIS — N2581 Secondary hyperparathyroidism of renal origin: Secondary | ICD-10-CM | POA: Diagnosis present

## 2023-09-07 DIAGNOSIS — R7989 Other specified abnormal findings of blood chemistry: Secondary | ICD-10-CM

## 2023-09-07 DIAGNOSIS — R569 Unspecified convulsions: Secondary | ICD-10-CM | POA: Diagnosis not present

## 2023-09-07 DIAGNOSIS — E1122 Type 2 diabetes mellitus with diabetic chronic kidney disease: Secondary | ICD-10-CM | POA: Diagnosis present

## 2023-09-07 DIAGNOSIS — E785 Hyperlipidemia, unspecified: Secondary | ICD-10-CM | POA: Diagnosis present

## 2023-09-07 DIAGNOSIS — R400 Somnolence: Principal | ICD-10-CM

## 2023-09-07 DIAGNOSIS — G40901 Epilepsy, unspecified, not intractable, with status epilepticus: Secondary | ICD-10-CM | POA: Diagnosis present

## 2023-09-07 DIAGNOSIS — Z833 Family history of diabetes mellitus: Secondary | ICD-10-CM | POA: Diagnosis not present

## 2023-09-07 DIAGNOSIS — Z7982 Long term (current) use of aspirin: Secondary | ICD-10-CM | POA: Diagnosis not present

## 2023-09-07 DIAGNOSIS — I959 Hypotension, unspecified: Secondary | ICD-10-CM | POA: Diagnosis present

## 2023-09-07 DIAGNOSIS — E875 Hyperkalemia: Secondary | ICD-10-CM | POA: Diagnosis not present

## 2023-09-07 DIAGNOSIS — R4701 Aphasia: Secondary | ICD-10-CM | POA: Diagnosis present

## 2023-09-07 DIAGNOSIS — Z860100 Personal history of colon polyps, unspecified: Secondary | ICD-10-CM | POA: Diagnosis not present

## 2023-09-07 DIAGNOSIS — Z1152 Encounter for screening for COVID-19: Secondary | ICD-10-CM

## 2023-09-07 DIAGNOSIS — I6509 Occlusion and stenosis of unspecified vertebral artery: Secondary | ICD-10-CM | POA: Diagnosis not present

## 2023-09-07 DIAGNOSIS — Z79899 Other long term (current) drug therapy: Secondary | ICD-10-CM | POA: Diagnosis not present

## 2023-09-07 DIAGNOSIS — G9340 Encephalopathy, unspecified: Principal | ICD-10-CM

## 2023-09-07 DIAGNOSIS — I132 Hypertensive heart and chronic kidney disease with heart failure and with stage 5 chronic kidney disease, or end stage renal disease: Secondary | ICD-10-CM | POA: Diagnosis present

## 2023-09-07 DIAGNOSIS — R471 Dysarthria and anarthria: Secondary | ICD-10-CM | POA: Diagnosis present

## 2023-09-07 DIAGNOSIS — J101 Influenza due to other identified influenza virus with other respiratory manifestations: Secondary | ICD-10-CM | POA: Diagnosis present

## 2023-09-07 DIAGNOSIS — E119 Type 2 diabetes mellitus without complications: Secondary | ICD-10-CM

## 2023-09-07 DIAGNOSIS — R0902 Hypoxemia: Secondary | ICD-10-CM | POA: Diagnosis present

## 2023-09-07 DIAGNOSIS — E11649 Type 2 diabetes mellitus with hypoglycemia without coma: Secondary | ICD-10-CM | POA: Diagnosis not present

## 2023-09-07 DIAGNOSIS — I5030 Unspecified diastolic (congestive) heart failure: Secondary | ICD-10-CM

## 2023-09-07 DIAGNOSIS — D631 Anemia in chronic kidney disease: Secondary | ICD-10-CM | POA: Diagnosis present

## 2023-09-07 DIAGNOSIS — E1151 Type 2 diabetes mellitus with diabetic peripheral angiopathy without gangrene: Secondary | ICD-10-CM | POA: Diagnosis present

## 2023-09-07 DIAGNOSIS — Z91199 Patient's noncompliance with other medical treatment and regimen due to unspecified reason: Secondary | ICD-10-CM | POA: Diagnosis not present

## 2023-09-07 DIAGNOSIS — I10 Essential (primary) hypertension: Secondary | ICD-10-CM | POA: Diagnosis present

## 2023-09-07 DIAGNOSIS — I5032 Chronic diastolic (congestive) heart failure: Secondary | ICD-10-CM | POA: Diagnosis present

## 2023-09-07 DIAGNOSIS — D696 Thrombocytopenia, unspecified: Secondary | ICD-10-CM | POA: Diagnosis present

## 2023-09-07 DIAGNOSIS — Z992 Dependence on renal dialysis: Secondary | ICD-10-CM

## 2023-09-07 DIAGNOSIS — R4182 Altered mental status, unspecified: Secondary | ICD-10-CM | POA: Diagnosis present

## 2023-09-07 DIAGNOSIS — I739 Peripheral vascular disease, unspecified: Secondary | ICD-10-CM | POA: Diagnosis present

## 2023-09-07 DIAGNOSIS — G9341 Metabolic encephalopathy: Principal | ICD-10-CM | POA: Diagnosis present

## 2023-09-07 DIAGNOSIS — R270 Ataxia, unspecified: Secondary | ICD-10-CM | POA: Diagnosis present

## 2023-09-07 DIAGNOSIS — N186 End stage renal disease: Secondary | ICD-10-CM

## 2023-09-07 LAB — COMPREHENSIVE METABOLIC PANEL
ALT: 527 U/L — ABNORMAL HIGH (ref 0–44)
AST: 169 U/L — ABNORMAL HIGH (ref 15–41)
Albumin: 3.3 g/dL — ABNORMAL LOW (ref 3.5–5.0)
Alkaline Phosphatase: 178 U/L — ABNORMAL HIGH (ref 38–126)
Anion gap: 23 — ABNORMAL HIGH (ref 5–15)
BUN: 100 mg/dL — ABNORMAL HIGH (ref 8–23)
CO2: 22 mmol/L (ref 22–32)
Calcium: 8.7 mg/dL — ABNORMAL LOW (ref 8.9–10.3)
Chloride: 92 mmol/L — ABNORMAL LOW (ref 98–111)
Creatinine, Ser: 14.03 mg/dL — ABNORMAL HIGH (ref 0.61–1.24)
GFR, Estimated: 3 mL/min — ABNORMAL LOW (ref >60)
Glucose, Bld: 100 mg/dL — ABNORMAL HIGH (ref 70–99)
Potassium: 4.5 mmol/L (ref 3.5–5.1)
Sodium: 137 mmol/L (ref 135–145)
Total Bilirubin: 1.1 mg/dL (ref 0.0–1.2)
Total Protein: 6.5 g/dL (ref 6.5–8.1)

## 2023-09-07 LAB — DIFFERENTIAL
Abs Immature Granulocytes: 0.07 10*3/uL (ref 0.00–0.07)
Basophils Absolute: 0 10*3/uL (ref 0.0–0.1)
Basophils Relative: 0 %
Eosinophils Absolute: 0 10*3/uL (ref 0.0–0.5)
Eosinophils Relative: 0 %
Immature Granulocytes: 1 %
Lymphocytes Relative: 8 %
Lymphs Abs: 0.7 10*3/uL (ref 0.7–4.0)
Monocytes Absolute: 0.5 10*3/uL (ref 0.1–1.0)
Monocytes Relative: 5 %
Neutro Abs: 7.9 10*3/uL — ABNORMAL HIGH (ref 1.7–7.7)
Neutrophils Relative %: 86 %
Smear Review: DECREASED

## 2023-09-07 LAB — I-STAT CHEM 8, ED
BUN: 94 mg/dL — ABNORMAL HIGH (ref 8–23)
Calcium, Ion: 0.85 mmol/L — CL (ref 1.15–1.40)
Chloride: 101 mmol/L (ref 98–111)
Creatinine, Ser: 15 mg/dL — ABNORMAL HIGH (ref 0.61–1.24)
Glucose, Bld: 100 mg/dL — ABNORMAL HIGH (ref 70–99)
HCT: 42 % (ref 39.0–52.0)
Hemoglobin: 14.3 g/dL (ref 13.0–17.0)
Potassium: 4.7 mmol/L (ref 3.5–5.1)
Sodium: 135 mmol/L (ref 135–145)
TCO2: 26 mmol/L (ref 22–32)

## 2023-09-07 LAB — I-STAT VENOUS BLOOD GAS, ED
Acid-Base Excess: 2 mmol/L (ref 0.0–2.0)
Bicarbonate: 27.8 mmol/L (ref 20.0–28.0)
Calcium, Ion: 0.98 mmol/L — ABNORMAL LOW (ref 1.15–1.40)
HCT: 42 % (ref 39.0–52.0)
Hemoglobin: 14.3 g/dL (ref 13.0–17.0)
O2 Saturation: 95 %
Potassium: 4.3 mmol/L (ref 3.5–5.1)
Sodium: 136 mmol/L (ref 135–145)
TCO2: 29 mmol/L (ref 22–32)
pCO2, Ven: 44.5 mm[Hg] (ref 44–60)
pH, Ven: 7.403 (ref 7.25–7.43)
pO2, Ven: 76 mm[Hg] — ABNORMAL HIGH (ref 32–45)

## 2023-09-07 LAB — PROTIME-INR
INR: 1.4 — ABNORMAL HIGH (ref 0.8–1.2)
Prothrombin Time: 17.7 s — ABNORMAL HIGH (ref 11.4–15.2)

## 2023-09-07 LAB — CBC
HCT: 39.7 % (ref 39.0–52.0)
Hemoglobin: 12.8 g/dL — ABNORMAL LOW (ref 13.0–17.0)
MCH: 26.1 pg (ref 26.0–34.0)
MCHC: 32.2 g/dL (ref 30.0–36.0)
MCV: 80.9 fL (ref 80.0–100.0)
Platelets: 93 10*3/uL — ABNORMAL LOW (ref 150–400)
RBC: 4.91 MIL/uL (ref 4.22–5.81)
RDW: 19.6 % — ABNORMAL HIGH (ref 11.5–15.5)
WBC: 9.3 10*3/uL (ref 4.0–10.5)
nRBC: 1.5 % — ABNORMAL HIGH (ref 0.0–0.2)

## 2023-09-07 LAB — RESP PANEL BY RT-PCR (RSV, FLU A&B, COVID)  RVPGX2
Influenza A by PCR: NEGATIVE
Influenza B by PCR: NEGATIVE
Resp Syncytial Virus by PCR: NEGATIVE
SARS Coronavirus 2 by RT PCR: NEGATIVE

## 2023-09-07 LAB — CBG MONITORING, ED
Glucose-Capillary: 107 mg/dL — ABNORMAL HIGH (ref 70–99)
Glucose-Capillary: 90 mg/dL (ref 70–99)

## 2023-09-07 LAB — MAGNESIUM: Magnesium: 2.8 mg/dL — ABNORMAL HIGH (ref 1.7–2.4)

## 2023-09-07 LAB — TSH: TSH: 2.102 u[IU]/mL (ref 0.350–4.500)

## 2023-09-07 LAB — LIPASE, BLOOD: Lipase: 33 U/L (ref 11–51)

## 2023-09-07 LAB — I-STAT CG4 LACTIC ACID, ED: Lactic Acid, Venous: 2.4 mmol/L (ref 0.5–1.9)

## 2023-09-07 LAB — HEMOGLOBIN A1C
Hgb A1c MFr Bld: 4.9 % (ref 4.8–5.6)
Mean Plasma Glucose: 93.93 mg/dL

## 2023-09-07 LAB — APTT: aPTT: 34 s (ref 24–36)

## 2023-09-07 LAB — ETHANOL: Alcohol, Ethyl (B): <10 mg/dL (ref <10)

## 2023-09-07 MED ORDER — ACETAMINOPHEN 650 MG RE SUPP: 650.0000 mg | Freq: Four times a day (QID) | RECTAL | Status: DC | PRN

## 2023-09-07 MED ORDER — HYDRALAZINE HCL 20 MG/ML IJ SOLN: 5.0000 mg | INTRAMUSCULAR | Status: DC | PRN

## 2023-09-07 MED ORDER — ONDANSETRON HCL 4 MG PO TABS: 4.0000 mg | ORAL_TABLET | Freq: Four times a day (QID) | ORAL | Status: DC | PRN

## 2023-09-07 MED ORDER — HEPARIN SODIUM (PORCINE) 5000 UNIT/ML IJ SOLN
5000.0000 [IU] | Freq: Three times a day (TID) | INTRAMUSCULAR | Status: DC
  Administered 2023-09-07 – 2023-09-13 (×12): 5000 [IU] via SUBCUTANEOUS
  Filled 2023-09-07 (×12): qty 1

## 2023-09-07 MED ORDER — SODIUM CHLORIDE 0.9 % IV SOLN
250.0000 mg | INTRAVENOUS | Status: DC | PRN
  Administered 2023-09-08: 250 mg via INTRAVENOUS
  Filled 2023-09-07: qty 2.5

## 2023-09-07 MED ORDER — LEVETIRACETAM IN NACL 1500 MG/100ML IV SOLN
1500.0000 mg | Freq: Once | INTRAVENOUS | Status: AC
  Administered 2023-09-07: 1500 mg via INTRAVENOUS
  Filled 2023-09-07: qty 100

## 2023-09-07 MED ORDER — SODIUM CHLORIDE 0.9% FLUSH: 3.0000 mL | Freq: Once | INTRAVENOUS | Status: DC

## 2023-09-07 MED ORDER — INSULIN ASPART 100 UNIT/ML IJ SOLN
0.0000 [IU] | INTRAMUSCULAR | Status: DC
  Administered 2023-09-09: 3 [IU] via SUBCUTANEOUS
  Administered 2023-09-09: 2 [IU] via SUBCUTANEOUS
  Administered 2023-09-10: 3 [IU] via SUBCUTANEOUS
  Administered 2023-09-11: 2 [IU] via SUBCUTANEOUS

## 2023-09-07 MED ORDER — LORAZEPAM 2 MG/ML IJ SOLN
INTRAMUSCULAR | Status: AC
  Filled 2023-09-07: qty 1

## 2023-09-07 MED ORDER — LACTATED RINGERS IV BOLUS
500.0000 mL | Freq: Once | INTRAVENOUS | Status: AC
  Administered 2023-09-07: 500 mL via INTRAVENOUS

## 2023-09-07 MED ORDER — MIDAZOLAM HCL (PF) 10 MG/2ML IJ SOLN
INTRAMUSCULAR | Status: AC
  Administered 2023-09-07: 5 mg via INTRAMUSCULAR
  Filled 2023-09-07: qty 2

## 2023-09-07 MED ORDER — CHLORHEXIDINE GLUCONATE CLOTH 2 % EX PADS
6.0000 | MEDICATED_PAD | Freq: Every day | CUTANEOUS | Status: DC
  Administered 2023-09-08 – 2023-09-13 (×5): 6 via TOPICAL

## 2023-09-07 MED ORDER — LEVETIRACETAM IN NACL 500 MG/100ML IV SOLN
500.0000 mg | INTRAVENOUS | Status: DC
  Administered 2023-09-08: 500 mg via INTRAVENOUS
  Filled 2023-09-07 (×2): qty 100

## 2023-09-07 MED ORDER — ACETAMINOPHEN 325 MG PO TABS
650.0000 mg | ORAL_TABLET | Freq: Four times a day (QID) | ORAL | Status: DC | PRN
  Administered 2023-09-10 – 2023-09-11 (×4): 650 mg via ORAL
  Filled 2023-09-07 (×5): qty 2

## 2023-09-07 MED ORDER — ONDANSETRON HCL 4 MG/2ML IJ SOLN
4.0000 mg | Freq: Four times a day (QID) | INTRAMUSCULAR | Status: DC | PRN
  Administered 2023-09-11: 4 mg via INTRAVENOUS
  Filled 2023-09-07: qty 2

## 2023-09-07 NOTE — H&P (Signed)
PCP:   Etta Grandchild, MD   Chief Complaint:  Altered mentation  HPI: This is a 73 year old male with past medical history significant for MWF, HTN, PAD, HLD.  Patient has missed the last 11 days of hemodialysis per wife because patient was too "to go out.  Last week he had 2 episodes of nausea and vomiting.  His appetite has been decreased.  Over the past week gotten progressively weaker, did complain of a headache, resolved with Tylenol.  No reports of fever or chills.  On arrival to hemodialysis patient noted to be weak, was taken via wheelchair.  2-1/2 hours into hemodialysis he was noted not to be aphasic and having abnormal movements in the right upper extremity.  He was sent to the ER as a code stroke.  In ER patient has been having seizures given IM Versed and loaded with IV Keppra 1000 mg.  Had episode of hypoxemic to the 70s, and hypotension systolic blood pressures to the 60s.  He was transiently placed on a nonrebreather.  Given 1 L IV fluid bolus.  BP responsive to fluids.  Since then patient has been poorly responsive with sonorous breathing.  Stat CT head done and negative.  Continuous EEG reviewed by neurology, no evidence of seizures. Patient's systolic blood pressure on presentation was 200.  AST 169, ALT 525, AST 178 [new], platelets 93, INR 1.4, CXR pulmonary edema mild.  Admission requested  Review of Systems:  Unable to obtain  Past Medical History: Past Medical History:  Diagnosis Date   Anemia    Anemia    Benign colon polyp 08/11/2010   Clot    STATED TOLD IN [facility] CLOT TO LEFT SHOULDER. 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   HTN (hypertension)    Hyperlipidemia    Renal failure    Past Surgical History:  Procedure Laterality Date   AV FISTULA PLACEMENT  01/19/2012   Procedure: ARTERIOVENOUS (AV) FISTULA CREATION;  Surgeon: Fransisco Hertz, MD;  Location: [facility] OR;  Service: Vascular;  Laterality: Right;  Right Brachio-cephalic arteriovenous fistula   AV FISTULA PLACEMENT Right 07/18/2021   Procedure: INSERTION OF RIGHT UPPER EXTREMITY ARTERIOVENOUS (AV) GORE-TEX GRAFT;  Surgeon: Leonie Douglas, MD;  Location: [facility] OR;  Service: Vascular;  Laterality: Right;  PERIPHERAL NERVE BLOCK   AV FISTULA PLACEMENT, RADIOCEPHALIC  12/31/10   Right arm   INSERTION OF DIALYSIS CATHETER  01/19/2012   Procedure: INSERTION OF DIALYSIS CATHETER;  Surgeon: Fransisco Hertz, MD;  Location[facility] OR;  Service: Vascular;  Laterality: N/A;  right internal jugular vein   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   UPPER EXTREMITY VENOGRAPHY Right 06/11/2021   Procedure: UPPER EXTREMITY VENOGRAPHY;  Surgeon: Nada Libman, MD;  Location: [facility] INVASIVE CV LAB;  Service: Cardiovascular;  Laterality: Right;   VENOGRAM Left 05/30/2021   Procedure: DIAGNOSTIC CENTRAL VENOGRAM;  Surgeon: Victorino Sparrow, MD;  Location: [facility] OR;  Service: Vascular;  Laterality: Left;    Medications: Prior to Admission medications   Medication Sig Start Date End Date Taking? Authorizing Provider  ACCU-CHEK SOFTCLIX LANCETS lancets 1 each by Other route 2 (two) times daily. Use to check blood sugars twice a day 03/04/17   Etta Grandchild, MD  acetaminophen (TYLENOL) 500 MG tablet Take 1,000 mg by mouth every 8 (eight) hours as  needed for moderate pain.    [provider]  amLODipine (NORVASC) 5 MG tablet Take 5 mg by mouth daily.    [provider]  Blood Glucose Monitoring Suppl (ACCU-CHEK AVIVA PLUS) w/Device KIT Use as directed to check blood sugars 03/04/17   Etta Grandchild, MD  carvedilol (COREG) 25 MG tablet Take 25 mg by mouth 2 (two) times daily with a meal.    [provider]  ezetimibe-simvastatin (VYTORIN) 10-10 MG tablet Take 1 tablet by mouth at bedtime. 12/27/21   Etta Grandchild, MD  glucose blood test strip Use TID 02/25/17   Etta Grandchild, MD   hydrALAZINE (APRESOLINE) 25 MG tablet Take 25 mg by mouth 2 (two) times daily. 09/01/22   [provider]  LOKELMA 10 g PACK packet Take 10 g by mouth 2 (two) times a week. 07/01/22   [provider]  losartan (COZAAR) 25 MG tablet Take 25 mg by mouth in the morning and at bedtime. 12/10/20   [provider]  meloxicam (MOBIC) 15 MG tablet TAKE 1 TABLET BY MOUTH ONCE DAILY FOR 10 DAYS 12/21/22   Georgina Quint, MD  sevelamer carbonate (RENVELA) 800 MG tablet Take 1,600 mg by mouth 3 (three) times daily with meals. 04/01/21   [provider]  sildenafil (REVATIO) 20 MG tablet Take 1 tablet (20 mg total) by mouth daily. 12/26/21   Etta Grandchild, MD  VELPHORO 500 MG chewable tablet Chew 1,000 mg by mouth 3 (three) times daily. 08/18/22   [provider]    Allergies:  No Known Allergies  Social History:  reports that he has never smoked. He has never used smokeless tobacco. He reports that he does not drink alcohol and does not use drugs.  Family History: Family History  Problem Relation Age of Onset   Diabetes Father    Colon cancer Neg Hx    Esophageal cancer Neg Hx    Stomach cancer Neg Hx    Rectal cancer Neg Hx     Physical Exam: Vitals:   09/07/23 1600 09/07/23 1645  BP:  (!) 163/58  Pulse:  72  Resp:  (!) 23  Temp:  (!) 96.8 F (36 C)  TempSrc:  Axillary  SpO2:  97%  Weight: 75.6 kg     General: Sonorous, well developed and nourished, no acute distress Eyes: Pink conjunctiva, no scleral icterus ENT: Moist oral mucosa, neck supple, no thyromegaly Lungs: CTA B/L, no wheeze, no crackles, no use of accessory muscles Cardiovascular: RRR, no regurgitation, no gallops, no murmurs. no JVD Abdomen: soft, positive BS, NTND, not an acute abdomen GU: not examined Neuro: Unable to assess Musculoskeletal: Unable to assess Skin: no rash, no subcutaneous crepitation, no decubitus Psych: Unable to assess   Labs on Admission:   Recent Labs    09/07/23 1619 09/07/23 1641 09/07/23 1705  NA 135 137 136  K 4.7 4.5 4.3  CL 101 92*  --   CO2  --  22  --   GLUCOSE 100* 100*  --   BUN 94* 100*  --   CREATININE 15.00* 14.03*  --   CALCIUM  --  8.7*  --   MG  --  2.8*  --    Recent Labs    09/07/23 1641  AST 169*  ALT 527*  ALKPHOS 178*  BILITOT 1.1  PROT 6.5  ALBUMIN 3.3*    Recent Labs    09/07/23 1641 09/07/23 1705  WBC 9.3  --  NEUTROABS 7.9*  --   HGB 12.8* 14.3  HCT 39.7 42.0  MCV 80.9  --   PLT 93*  --     Radiological Exams on Admission: CT HEAD CODE STROKE WO CONTRAST Result Date: 09/07/2023 CLINICAL DATA:  Code stroke.  Unresponsive EXAM: CT HEAD WITHOUT CONTRAST TECHNIQUE: Contiguous axial images were obtained from the base of the skull through the vertex without intravenous contrast. RADIATION DOSE REDUCTION: This exam was performed according to the departmental dose-optimization program which includes automated exposure control, adjustment of the mA and/or kV according to patient size and/or use of iterative reconstruction technique. COMPARISON:  None Available. FINDINGS: Brain: Possible loss of gray-white differentiation bilateral anterior frontal lobes (series 2, image 25) is favored to be artifactual. No other evidence of acute infarct. No evidence of acute hemorrhage, mass, mass effect, or midline shift. No hydrocephalus or extra-axial collection. Vascular: No hyperdense vessel. Atherosclerotic calcifications in the intracranial carotid and vertebral arteries. Skull: Negative for fracture or focal lesion. Sinuses/Orbits: Mucosal thickening and mucous retention cysts in the maxillary sinuses. No acute finding in the orbits. Other: The mastoid air cells are well aerated. ASPECTS (Alberta Stroke Program Early CT Score) - Ganglionic level infarction (caudate, lentiform nuclei, internal capsule, insula, M1-M3 cortex): 7 - Supraganglionic infarction (M4-M6 cortex): 3 Total score (0-10 with 10  being normal): 10 IMPRESSION: Possible loss of gray-white differentiation in the bilateral anterior frontal lobes, likely artifactual. No other evidence of acute infarct. No acute intracranial hemorrhage. Imaging results were communicated on 09/07/2023 at 4:35 pm to provider STACK via secure text paging. Electronically Signed   By: Wiliam Ke M.D.   On: 09/07/2023 16:35    Assessment/Plan Present on Admission:  Altered mentation -Differential diagnosis: Seizures, CVA, uremia, infection -CVA: CT head normal.  Plans repeat MRI if patient remains neurologically impaired in AM.  EEG leads and MRI incompatible -Seizures: Continuous EEG underway.  Patient loaded with IV Keppra.  Keppra twice daily ordered by neurology.  Neurology on board. -Uremia: Per nephrology unable to do hemodialysis until 12 hours after prior.  Concern for fluid shifts. -Infection: No evidence of infection.  Patient does not make urine.  Respiratory panel normal hep B normal.  CXR cardiomegaly with pulmonary edema.  WBC 8.3 -Elevated LFTs have been normal.  Abdominal ultrasound normal..  Ammonia level normal -Sedation: Patient recently received Ativan.  VBG pH 7.3. -Monitor patient   Dyslipidemia, goal LDL below 100 -Statin on hold   End stage renal disease (HCC) -MWF.  Nephrologist aware.  Plans for hemodialysis in a.m.   Hypertension -As needed Lopressor ordered   Gahel Safley 09/07/2023, 7:50 PM

## 2023-09-07 NOTE — Progress Notes (Signed)
Stat LTM EEG hooked up and running - no initial skin breakdown - push button tested - [facility] NOT monitoring pt in ED.

## 2023-09-07 NOTE — ED Provider Notes (Incomplete)
[place] EMERGENCY DEPARTMENT AT [facility] Provider Note  MDM   HPI/ROS:  [name] is a 73 y.o. male with pertinent past medical history of ESRD on HD Monday Wednesday Friday, who presents ***   Per EMS, patient was far more weak than normal when he arrived for dialysis today at 12:25 PM requiring him to use a wheelchair when he is normally ambulatory.  He received about 2.5 hours of his dialysis session when staff noticed that he was completely aphasic and that his right arm was twitching.  Initial vital signs with blood pressure 200/90, heart rate 76, blood glucose 131.  He reportedly had missed dialysis for 11 days prior to this time.  Spoke with patient's wife, missed HD last week because he said it was too cold to go outside.   Physical exam is notable for: -***  On my initial evaluation, patient is:  -Vital signs stable.*** Patient afebrile***, hemodynamically stable***, and non-toxic appearing.*** -Additional history obtained from ***  This patient's current presentation, including their history and physical exam, is most consistent with ***. Differentials include ***.     Interpretations, interventions, and the patient's course of care are documented below.      ***   Disposition:  {ED Dispo:29898}  Clinical Impression: No diagnosis found.  Rx / DC Orders ED Discharge Orders     None       The plan for this patient was discussed with Dr. ***, who voiced agreement and who oversaw evaluation and treatment of this patient.   Clinical Complexity A medically appropriate history, review of systems, and physical exam was performed.  My independent interpretations of EKG, labs, and radiology are documented in the ED course above.   If decision rules were used in this patient's evaluation, they are listed below.  *** Click here for ABCD2, HEART and other calculatorsREFRESH Note before signing   Patient's presentation is most consistent with {EM  COPA:27473}  Medical Decision Making Amount and/or Complexity of Data Reviewed Labs: ordered. Radiology: ordered.    HPI/ROS      See MDM section for pertinent HPI and ROS. A complete ROS was performed with pertinent positives/negatives noted above.   Past Medical History:  Diagnosis Date   Anemia    Anemia    Benign colon polyp 08/11/2010   Clot    STATED TOLD IN APRIL CLOT TO LEFT SHOULDER. 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   HTN (hypertension)    Hyperlipidemia    Renal failure     Past Surgical History:  Procedure Laterality Date   AV FISTULA PLACEMENT  01/19/2012   Procedure: ARTERIOVENOUS (AV) FISTULA CREATION;  Surgeon: Fransisco Hertz, MD;  Location: [facility] OR;  Service: Vascular;  Laterality: Right;  Right Brachio-cephalic arteriovenous fistula   AV FISTULA PLACEMENT Right 07/18/2021   Procedure: INSERTION OF RIGHT UPPER EXTREMITY ARTERIOVENOUS (AV) GORE-TEX GRAFT;  Surgeon: Leonie Douglas, MD;  Location: [facility] OR;  Service: Vascular;  Laterality: Right;  PERIPHERAL NERVE BLOCK   AV FISTULA PLACEMENT, RADIOCEPHALIC  12/31/10   Right arm   INSERTION OF DIALYSIS CATHETER  01/19/2012   Procedure: INSERTION OF DIALYSIS CATHETER;  Surgeon: Fransisco Hertz, MD;  Location: [facility] OR;  Service: Vascular;  Laterality: N/A;  right internal jugular vein   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  UPPER EXTREMITY VENOGRAPHY Right 06/11/2021   Procedure: UPPER EXTREMITY VENOGRAPHY;  Surgeon: Nada Libman, MD;  Location: [facility] INVASIVE CV LAB;  Service: Cardiovascular;  Laterality: Right;   VENOGRAM Left 05/30/2021   Procedure: DIAGNOSTIC CENTRAL VENOGRAM;  Surgeon: Victorino Sparrow, MD;  Location: [facility] OR;  Service: Vascular;  Laterality: Left;      Physical Exam   Vitals:   09/07/23 1600  Weight: 75.6 kg    Physical Exam Gen: NAD. Appears comfortable HENT:  Conjunctiva clear, PERRL, EOMI. MMM.  CV: RRR. No M/R/G Pulm: Lungs CTAB with no wheezing, rales, or rhonchi.  GI: Abdomen soft, non-tender, non-distended. Normal bowel sounds in all 4 quadrants. MSK/Skin: No lower extremity edema. Extremities warm, well-perfused with 2+ pulses in all 4 extremities. Neuro: A&Ox3. GCS 15. Moves all extremities.     Procedures   If procedures were preformed on this patient, they are listed below:  Procedures   Mikeal Hawthorne, MD Emergency Medicine PGY-2   Please note that this documentation was produced with the assistance of voice-to-text technology and may contain errors.

## 2023-09-07 NOTE — Progress Notes (Signed)
Md aware and at bedside of nihs.

## 2023-09-07 NOTE — ED Notes (Signed)
EEG at bedside.

## 2023-09-07 NOTE — ED Triage Notes (Addendum)
Came to Dialysis at 1225, was talking however weak but had to come in via wheel chair due to weakness, staff assumed due to missing 11 days of dialysis. Staff then noticed he was non verbal and that his R arm was twitching 2 hours into dialysis. Semi verbal for ems grunting to questions. Pt was less active with Left arm for ems.  Vitals from ems  Bps 200/90 Cbg 131 Hr 76  EMS 12 lead ECG described as unremarkable.

## 2023-09-07 NOTE — Consult Note (Signed)
[facility] Renal Consultation Note  Requesting MD: [name], MD Indication for Consultation:  ESRD, altered  Chief complaint: altered mental status   HPI:  [name] is a 73 y.o. male with a history of ESRD on hemodialysis Monday Wednesday Friday with dialysis hypertension type 2 diabetes mellitus after altered mental status and and weakness after dialysis today.  Staff had to bring him into HD via wheelchair today and he was driven by his wife; he is normally alert and oriented and usually drives himself to his HD treatments.  Treatment was terminated prematurely after 2 hours and 40 minutes as he was having "sporadic arm and leg movements and not speaking"; he was able to open his eyes to his name at the HD unit but was not speaking per clinic manager note.  Then had a rhythmic movement of the right extremity.  Per chart review, the patient was actively seizing on arrival to the ER, got versed, and was later hypotensive.  Neurology was consulted and he was loaded with keppra.    Nephrology is consulted for assistance with management of ESRD.  He had HD on 1/17 and again on 1/27. (He missed 1/20, 1/22, and 1/24).  Note that he has struggled with dialysis compliance; he has indicated to staff numerous times that he "knows my body and what my body needs" and he has skipped dialysis on Wednesdays for months; he has been counseled by this MD as well as numerous other staff members as to the risks of dialysis noncompliance including death, MI, and seizure.  Spoke with his wife at bedside and his daughter via speakerphone; his daughter is a Development worker, community.  His wife states that he hasn't spoken since he has been here.  She has been nervous watching him.    PMHx:   Past Medical History:  Diagnosis Date   Anemia    Anemia    Benign colon polyp 08/11/2010   Clot    STATED TOLD IN APRIL CLOT TO LEFT SHOULDER. 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   HTN (hypertension)    Hyperlipidemia    Renal failure     Past Surgical History:  Procedure Laterality Date   AV FISTULA PLACEMENT  01/19/2012   Procedure: ARTERIOVENOUS (AV) FISTULA CREATION;  Surgeon: Fransisco Hertz, MD;  Location: [facility] OR;  Service: Vascular;  Laterality: Right;  Right Brachio-cephalic arteriovenous fistula   AV FISTULA PLACEMENT Right 07/18/2021   Procedure: INSERTION OF RIGHT UPPER EXTREMITY ARTERIOVENOUS (AV) GORE-TEX GRAFT;  Surgeon: Leonie Douglas, MD;  Location: [facility] OR;  Service: Vascular;  Laterality: Right;  PERIPHERAL NERVE BLOCK   AV FISTULA PLACEMENT, RADIOCEPHALIC  12/31/10   Right arm   INSERTION OF DIALYSIS CATHETER  01/19/2012   Procedure: INSERTION OF DIALYSIS CATHETER;  Surgeon: Fransisco Hertz, MD;  Location: [facility] OR;  Service: Vascular;  Laterality: N/A;  right internal jugular vein   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   UPPER EXTREMITY VENOGRAPHY Right 06/11/2021   Procedure: UPPER EXTREMITY VENOGRAPHY;  Surgeon: Nada Libman, MD;  Location: [facility] INVASIVE CV LAB;  Service: Cardiovascular;  Laterality: Right;   VENOGRAM Left 05/30/2021   Procedure: DIAGNOSTIC CENTRAL VENOGRAM;  Surgeon: Victorino Sparrow, MD;  Location: [facility] OR;  Service: Vascular;  Laterality: Left;    Family Hx:  Family History  Problem Relation Age of Onset   Diabetes Father    Colon cancer Neg Hx    Esophageal cancer Neg Hx    Stomach cancer Neg Hx    Rectal cancer Neg Hx     Social History:  reports that he has never smoked. He has never used smokeless tobacco. He reports that he does not drink alcohol and does not use drugs.  Allergies: No Known Allergies  Medications: Prior to Admission medications   Medication Sig Start Date End Date Taking? Authorizing Provider  ACCU-CHEK SOFTCLIX LANCETS lancets 1 each by Other route 2 (two) times daily. Use to check blood sugars twice a  day 03/04/17   Etta Grandchild, MD  acetaminophen (TYLENOL) 500 MG tablet Take 1,000 mg by mouth every 8 (eight) hours as needed for moderate pain.    [provider]  amLODipine (NORVASC) 5 MG tablet Take 5 mg by mouth daily.    [provider]  Blood Glucose Monitoring Suppl (ACCU-CHEK AVIVA PLUS) w/Device KIT Use as directed to check blood sugars 03/04/17   Etta Grandchild, MD  carvedilol (COREG) 25 MG tablet Take 25 mg by mouth 2 (two) times daily with a meal.    [provider]  ezetimibe-simvastatin (VYTORIN) 10-10 MG tablet Take 1 tablet by mouth at bedtime. 12/27/21   Etta Grandchild, MD  glucose blood test strip Use TID 02/25/17   Etta Grandchild, MD  hydrALAZINE (APRESOLINE) 25 MG tablet Take 25 mg by mouth 2 (two) times daily. 09/01/22   [provider]  LOKELMA 10 g PACK packet Take 10 g by mouth 2 (two) times a week. 07/01/22   [provider]  losartan (COZAAR) 25 MG tablet Take 25 mg by mouth in the morning and at bedtime. 12/10/20   [provider]  meloxicam (MOBIC) 15 MG tablet TAKE 1 TABLET BY MOUTH ONCE DAILY FOR 10 DAYS 12/21/22   Georgina Quint, MD  sevelamer carbonate (RENVELA) 800 MG tablet Take 1,600 mg by mouth 3 (three) times daily with meals. 04/01/21   [provider]  sildenafil (REVATIO) 20 MG tablet Take 1 tablet (20 mg total) by mouth daily. 12/26/21   Etta Grandchild, MD  VELPHORO 500 MG chewable tablet Chew 1,000 mg by mouth 3 (three) times daily. 08/18/22   [provider]    I have reviewed the patient's current and reported prior to admission medications.  Labs:     Latest Ref Rng & Units 09/07/2023    5:05 PM 09/07/2023    4:41 PM 09/07/2023    4:19 PM  BMP  Glucose 70 - 99 mg/dL  161  096   BUN 8 - 23 mg/dL  045  94   Creatinine 4.09 - 1.24 mg/dL  81.19  14.78   Sodium 135 - 145 mmol/L 136  137  135   Potassium 3.5 - 5.1 mmol/L 4.3  4.5  4.7   Chloride 98 - 111 mmol/L  92  101   CO2  22 - 32 mmol/L  22    Calcium 8.9 - 10.3 mg/dL  8.7      Urinalysis    Component Value Date/Time   COLORURINE YELLOW 09/16/2020 1256   APPEARANCEUR CLOUDY (A) 09/16/2020 1256   LABSPEC 1.011 09/16/2020 1256   PHURINE 6.0 09/16/2020 1256   GLUCOSEU NEGATIVE 09/16/2020 1256   GLUCOSEU NEGATIVE 07/30/2011 1440   HGBUR SMALL (A) 09/16/2020 1256   BILIRUBINUR NEGATIVE 09/16/2020 1256  KETONESUR NEGATIVE 09/16/2020 1256   PROTEINUR 100 (A) 09/16/2020 1256   UROBILINOGEN 0.2 09/30/2011 1824   NITRITE NEGATIVE 09/16/2020 1256   LEUKOCYTESUR LARGE (A) 09/16/2020 1256     ROS:  Unable to obtain secondary to AMS/obtunded   Physical Exam: Vitals:   09/07/23 1645  BP: (!) 163/58  Pulse: 72  Resp: (!) 23  Temp: (!) 96.8 F (36 C)  SpO2: 97%     General:  adult male in stretcher; minimally interactive with examiner HEENT: NCAT Eyes: does not track reliably Neck: supple trachea midline Heart: S1S2 no rub Lungs: clear with transmitted upper airway sounds; on continuous oxygen which he intermittently removes Abdomen: soft/nt/nd Extremities: no edema appreciated; no cyanosis or clubbing  Skin:no rash on extremities exposed  Neuro: ER attending elicited a gag reflex from patient while I was at bedside; he is hooked up to continuous EEG monitoring. He does not follow commands or speak; does respond to pain  RUE AV graft with needles still accessed   Outpatient HD orders:  [name]  MWF  4 hours Noncompliant - skips HD every Wednesday against medical advice  400 BF 800 DF EDW 73.3 kg  Last tx 1/27 - left at 74 kg - treatment ended prematurely as above  AV graft 08/28/23 with post weight of 73.2 kg.  2 K/2 Calcium Meds: calcitriol 4 mcg three times a week; sensipar 30 mg three times a week with HD; venofer 100 mg IV with HD x 10; mircera 100 mcg every 2 weeks (last given 08/19/23; due today however didn't get outpatient per charting)   Assessment/Plan:   # Seizure  -  Appreciate neurology - He is s/p keppra load - anti-epileptics per neurology  # Altered mental status  - Post-ictal as well as s/p PRN and with uremia  - Optimize seizure treatment as above  - Can perform serial HD however would be unsafe to perform additional HD tonight - I spoke with the ED and they are watching for the need to intubate - ICU vs. Stepdown unit and would advocate for ICU if a bed is available  # ESRD - Serial HD. HD tomorrow and then per MWF schedule  - I have called the HD RN who has arrived at bedside to take his needles out  - He had 2 hours and 40 minutes of an HD treatment today and this was terminated prematurely after an abrupt neurologic change (stopping speaking) and rhythmic jerking.  He is not acceptable for further dialysis tonight   # HTN  - improved control; hypotension earlier after a PRN for seizures   # Anemia of CKD  - Mircera was held today outpatient - ESA contraindicated currently with Hb 14.3 - Trend and reassess for need to resume   # Secondary hyperparathyroidism, renal  - Can resume binders and calcitriol once taking PO    Thank you for the consult.  Please do not hesitate to contact us with any questions regarding the patient    [name] 09/07/2023, 10:05 PM

## 2023-09-07 NOTE — Consult Note (Addendum)
NEUROLOGY CONSULT NOTE   Date of service: September 07, 2023 Patient Name: SAHAJ BONA MRN:  [id number] DOB:  Mar 05, 1951 Chief Complaint: stroke code Requesting Provider: [name], MD  History of Present Illness   This is a 73 year old man with a past medical history of DM2, end-stage renal disease on HD Monday Wednesday Friday who presented as a stroke code and was actively seizing on arrival.  Per EMS he had missed dialysis for 11 days.  His wife later explained that he was not feeling well so did not go to dialysis on those days.  There was no last known well because patient had been feeling progressively worse all week last week.  He was so weak that staff had to bring him into dialysis today by wheelchair.  He had undergone approximately 2.5 hours of dialysis and stopped speaking and had rhythmic jerking of his right upper extremity.  This continued on presentation to the ED.  We were unable to quickly secure IV access therefore he got 5 mg of IM Versed.  Approximately 15 minutes after that he became severely hypotensive to 60s over 40s and desatted to the 70s.  His blood pressure did recover shortly afterwards and he was stable on nonrebreather.  He did not require intubation.  He received 4.5 g of Keppra and the right sided rhythmic jerking stopped.  He was hooked up to stat EEG and on my personal review of the monitor at bedside was not actively seizing upon hookup.  He has no history of seizures.  TNK was not administered due to unknown last known well, sometimes several days ago.  Other than the focal seizure he was nonfocal on exam and after the seizure was done he was withdrawing in all extremities equally therefore CTA was not performed.  At that point he was obtunded and we were unable to test further for aphasia.   NIHSS components Score: Comment  1a Level of Conscious 0[]  1[x]  2[]  3[]      1b LOC Questions 0[]  1[]  2[x]       1c LOC Commands 0[]  1[]  2[x]       2 Best Gaze 0[x]   1[]  2[]       3 Visual 0[x]  1[]  2[]  3[]      4 Facial Palsy 0[x]  1[]  2[]  3[]      5a Motor Arm - left 0[]  1[]  2[]  3[x]  4[]  UN[]    5b Motor Arm - Right 0[]  1[]  2[]  3[x]  4[]  UN[]    6a Motor Leg - Left 0[]  1[]  2[]  3[x]  4[]  UN[]    6b Motor Leg - Right 0[]  1[]  2[]  3[x]  4[]  UN[]    7 Limb Ataxia 0[x]  1[]  2[]  3[]  UN[]     8 Sensory 0[x]  1[]  2[]  UN[]      9 Best Language 0[]  1[]  2[]  3[x]      10 Dysarthria 0[]  1[]  2[x]  UN[]      11 Extinct. and Inattention 0[x]  1[]  2[]       TOTAL:  22      ROS   UTA 2/2 aphasia  Past History   Past Medical History:  Diagnosis Date   Anemia    Anemia    Benign colon polyp 08/11/2010   Clot    STATED TOLD IN APRIL CLOT TO LEFT SHOULDER. 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   HTN (hypertension)    Hyperlipidemia  Renal failure     Past Surgical History:  Procedure Laterality Date   AV FISTULA PLACEMENT  01/19/2012   Procedure: ARTERIOVENOUS (AV) FISTULA CREATION;  Surgeon: Fransisco Hertz, MD;  Location: [facility] OR;  Service: Vascular;  Laterality: Right;  Right Brachio-cephalic arteriovenous fistula   AV FISTULA PLACEMENT Right 07/18/2021   Procedure: INSERTION OF RIGHT UPPER EXTREMITY ARTERIOVENOUS (AV) GORE-TEX GRAFT;  Surgeon: Leonie Douglas, MD;  Location: [facility] OR;  Service: Vascular;  Laterality: Right;  PERIPHERAL NERVE BLOCK   AV FISTULA PLACEMENT, RADIOCEPHALIC  12/31/10   Right arm   INSERTION OF DIALYSIS CATHETER  01/19/2012   Procedure: INSERTION OF DIALYSIS CATHETER;  Surgeon: Fransisco Hertz, MD;  Location: [facility] OR;  Service: Vascular;  Laterality: N/A;  right internal jugular vein   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   UPPER EXTREMITY VENOGRAPHY Right 06/11/2021   Procedure: UPPER EXTREMITY VENOGRAPHY;  Surgeon: Nada Libman, MD;  Location: [facility] INVASIVE CV LAB;  Service: Cardiovascular;  Laterality: Right;   VENOGRAM Left  05/30/2021   Procedure: DIAGNOSTIC CENTRAL VENOGRAM;  Surgeon: Victorino Sparrow, MD;  Location: [facility] OR;  Service: Vascular;  Laterality: Left;    Family History: Family History  Problem Relation Age of Onset   Diabetes Father    Colon cancer Neg Hx    Esophageal cancer Neg Hx    Stomach cancer Neg Hx    Rectal cancer Neg Hx     Social History  reports that he has never smoked. He has never used smokeless tobacco. He reports that he does not drink alcohol and does not use drugs.  No Known Allergies  Medications   Current Facility-Administered Medications:    sodium chloride flush (NS) 0.9 % injection 3 mL, 3 mL, Intravenous, Once, [name], MD  Current Outpatient Medications:    ACCU-CHEK SOFTCLIX LANCETS lancets, 1 each by Other route 2 (two) times daily. Use to check blood sugars twice a day, Disp: 100 each, Rfl: 3   acetaminophen (TYLENOL) 500 MG tablet, Take 1,000 mg by mouth every 8 (eight) hours as needed for moderate pain., Disp: , Rfl:    amLODipine (NORVASC) 5 MG tablet, Take 5 mg by mouth daily., Disp: , Rfl:    Blood Glucose Monitoring Suppl (ACCU-CHEK AVIVA PLUS) w/Device KIT, Use as directed to check blood sugars, Disp: 1 kit, Rfl: 0   carvedilol (COREG) 25 MG tablet, Take 25 mg by mouth 2 (two) times daily with a meal., Disp: , Rfl:    ezetimibe-simvastatin (VYTORIN) 10-10 MG tablet, Take 1 tablet by mouth at bedtime., Disp: 90 tablet, Rfl: 1   glucose blood test strip, Use TID, Disp: 100 each, Rfl: 12   hydrALAZINE (APRESOLINE) 25 MG tablet, Take 25 mg by mouth 2 (two) times daily., Disp: , Rfl:    LOKELMA 10 g PACK packet, Take 10 g by mouth 2 (two) times a week., Disp: , Rfl:    losartan (COZAAR) 25 MG tablet, Take 25 mg by mouth in the morning and at bedtime., Disp: , Rfl:    meloxicam (MOBIC) 15 MG tablet, TAKE 1 TABLET BY MOUTH ONCE DAILY FOR 10 DAYS, Disp: 10 tablet, Rfl: 0   sevelamer carbonate (RENVELA) 800 MG tablet, Take 1,600 mg by mouth 3 (three)  times daily with meals., Disp: , Rfl:    sildenafil (REVATIO) 20 MG tablet, Take 1 tablet (20 mg total) by mouth daily., Disp: 10 tablet, Rfl: 5   VELPHORO 500 MG chewable tablet, Chew 1,000 mg  by mouth 3 (three) times daily., Disp: , Rfl:   Vitals   Vitals:   2023-09-28 1600 September 28, 2023 1645  BP:  (!) 163/58  Pulse:  72  Resp:  (!) 23  Temp:  (!) 96.8 F (36 C)  TempSrc:  Axillary  SpO2:  97%  Weight: 75.6 kg     Body mass index is 23.25 kg/m.  Physical Exam   Gen: patient lying in bed, NAD CV: extremities appear well-perfused Resp: normal WOB  Neurologic Examination   MS: lethargic, able to stick tongue out on command but not follow commands on either side, not able to answer orientation questions Speech: no intelligible speech CN: PERRL, blinks to threat bilat, EOMI, face symmetric, hearing intact to voice Motor & sensory: withdraws to noxious stimuli in all extremities equally Reflexes: 1+ symm with toes down bilat Coordination: UTA Gait: deferred  Labs/Imaging/Neurodiagnostic studies   CBC:  Recent Labs  Lab 09-28-23 1641 09/28/2023 1705  WBC 9.3  --   NEUTROABS 7.9*  --   HGB 12.8* 14.3  HCT 39.7 42.0  MCV 80.9  --   PLT 93*  --    Basic Metabolic Panel:  Lab Results  Component Value Date   NA 136 2023-09-28   K 4.3 09-28-23   CO2 22 2023-09-28   GLUCOSE 100 (H) 09/28/2023   BUN 100 (H) 2023/09/28   CREATININE 14.03 (H) 09/28/2023   CALCIUM 8.7 (L) 28-Sep-2023   GFRNONAA 3 (L) 09-28-23   GFRAA 13 (L) 04/18/2020   Lipid Panel:  Lab Results  Component Value Date   LDLCALC 92 03/07/2020   HgbA1c:  Lab Results  Component Value Date   HGBA1C 5.2 10/05/2022   Urine Drug Screen: No results found for: "LABOPIA", "COCAINSCRNUR", "LABBENZ", "AMPHETMU", "THCU", "LABBARB"  Alcohol Level     Component Value Date/Time   ETH <10 09/28/23 1641   INR  Lab Results  Component Value Date   INR 1.4 (H) 2023-09-28   APTT  Lab Results  Component  Value Date   APTT 34 09/28/2023   AED levels: No results found for: "PHENYTOIN", "ZONISAMIDE", "LAMOTRIGINE", "LEVETIRACETA"  CT Head without contrast(Personally reviewed): Possible loss of gray-white differentiation in the bilateral anterior frontal lobes, likely artifactual. No other evidence of acute infarct. No acute intracranial hemorrhage.  ASSESSMENT   This is a 73 year old man with a past medical history of DM2, end-stage renal disease on HD Monday Wednesday Friday who presented with R focal seizure in the setting of missing dialysis x11 days.   RECOMMENDATIONS   - cEEG - S/p 4.5g load keppra, continue 500mg  daily + additional 250mg  after each dialysis - Consult to nephrology for HD in the setting of persistent uremia - If patient returns to mental status baseline and at that time has any persistent focal deficits would be reasonable to do MRI at that time. Current leads are not compatible. ______________________________________________________________________    Signed, Jefferson Fuel, MD [facility]

## 2023-09-08 ENCOUNTER — Other Ambulatory Visit: Payer: Self-pay

## 2023-09-08 ENCOUNTER — Encounter (HOSPITAL_COMMUNITY): Payer: Self-pay | Admitting: Family Medicine

## 2023-09-08 DIAGNOSIS — R569 Unspecified convulsions: Secondary | ICD-10-CM | POA: Diagnosis not present

## 2023-09-08 DIAGNOSIS — R400 Somnolence: Secondary | ICD-10-CM | POA: Diagnosis not present

## 2023-09-08 DIAGNOSIS — G40901 Epilepsy, unspecified, not intractable, with status epilepticus: Secondary | ICD-10-CM | POA: Diagnosis not present

## 2023-09-08 LAB — I-STAT VENOUS BLOOD GAS, ED
Acid-Base Excess: 2 mmol/L (ref 0.0–2.0)
Bicarbonate: 28.4 mmol/L — ABNORMAL HIGH (ref 20.0–28.0)
Calcium, Ion: 1 mmol/L — ABNORMAL LOW (ref 1.15–1.40)
HCT: 38 % — ABNORMAL LOW (ref 39.0–52.0)
Hemoglobin: 12.9 g/dL — ABNORMAL LOW (ref 13.0–17.0)
O2 Saturation: 93 %
Potassium: 5.2 mmol/L — ABNORMAL HIGH (ref 3.5–5.1)
Sodium: 135 mmol/L (ref 135–145)
TCO2: 30 mmol/L (ref 22–32)
pCO2, Ven: 48.3 mm[Hg] (ref 44–60)
pH, Ven: 7.377 (ref 7.25–7.43)
pO2, Ven: 69 mm[Hg] — ABNORMAL HIGH (ref 32–45)

## 2023-09-08 LAB — HEPATITIS PANEL, ACUTE
HCV Ab: NONREACTIVE
Hep A IgM: NONREACTIVE
Hep B C IgM: NONREACTIVE
Hepatitis B Surface Ag: NONREACTIVE

## 2023-09-08 LAB — CBC WITH DIFFERENTIAL/PLATELET
Abs Immature Granulocytes: 0.05 10*3/uL (ref 0.00–0.07)
Basophils Absolute: 0 10*3/uL (ref 0.0–0.1)
Basophils Relative: 0 %
Eosinophils Absolute: 0 10*3/uL (ref 0.0–0.5)
Eosinophils Relative: 1 %
HCT: 36.8 % — ABNORMAL LOW (ref 39.0–52.0)
Hemoglobin: 11.6 g/dL — ABNORMAL LOW (ref 13.0–17.0)
Immature Granulocytes: 1 %
Lymphocytes Relative: 8 %
Lymphs Abs: 0.6 10*3/uL — ABNORMAL LOW (ref 0.7–4.0)
MCH: 26 pg (ref 26.0–34.0)
MCHC: 31.5 g/dL (ref 30.0–36.0)
MCV: 82.5 fL (ref 80.0–100.0)
Monocytes Absolute: 0.7 10*3/uL (ref 0.1–1.0)
Monocytes Relative: 9 %
Neutro Abs: 6.8 10*3/uL (ref 1.7–7.7)
Neutrophils Relative %: 81 %
Platelets: 82 10*3/uL — ABNORMAL LOW (ref 150–400)
RBC: 4.46 MIL/uL (ref 4.22–5.81)
RDW: 19.5 % — ABNORMAL HIGH (ref 11.5–15.5)
WBC: 8.3 10*3/uL (ref 4.0–10.5)
nRBC: 0.4 % — ABNORMAL HIGH (ref 0.0–0.2)

## 2023-09-08 LAB — CBG MONITORING, ED
Glucose-Capillary: 100 mg/dL — ABNORMAL HIGH (ref 70–99)
Glucose-Capillary: 61 mg/dL — ABNORMAL LOW (ref 70–99)
Glucose-Capillary: 69 mg/dL — ABNORMAL LOW (ref 70–99)
Glucose-Capillary: 78 mg/dL (ref 70–99)
Glucose-Capillary: 84 mg/dL (ref 70–99)

## 2023-09-08 LAB — COMPREHENSIVE METABOLIC PANEL
ALT: 446 U/L — ABNORMAL HIGH (ref 0–44)
AST: 127 U/L — ABNORMAL HIGH (ref 15–41)
Albumin: 2.8 g/dL — ABNORMAL LOW (ref 3.5–5.0)
Alkaline Phosphatase: 161 U/L — ABNORMAL HIGH (ref 38–126)
Anion gap: 20 — ABNORMAL HIGH (ref 5–15)
BUN: 103 mg/dL — ABNORMAL HIGH (ref 8–23)
CO2: 25 mmol/L (ref 22–32)
Calcium: 8.7 mg/dL — ABNORMAL LOW (ref 8.9–10.3)
Chloride: 92 mmol/L — ABNORMAL LOW (ref 98–111)
Creatinine, Ser: 15.49 mg/dL — ABNORMAL HIGH (ref 0.61–1.24)
GFR, Estimated: 3 mL/min — ABNORMAL LOW (ref >60)
Glucose, Bld: 83 mg/dL (ref 70–99)
Potassium: 5.2 mmol/L — ABNORMAL HIGH (ref 3.5–5.1)
Sodium: 137 mmol/L (ref 135–145)
Total Bilirubin: 0.9 mg/dL (ref 0.0–1.2)
Total Protein: 5.8 g/dL — ABNORMAL LOW (ref 6.5–8.1)

## 2023-09-08 LAB — LACTIC ACID, PLASMA
Lactic Acid, Venous: 1.6 mmol/L (ref 0.5–1.9)
Lactic Acid, Venous: 1.7 mmol/L (ref 0.5–1.9)

## 2023-09-08 LAB — HEPATITIS B SURFACE ANTIGEN: Hepatitis B Surface Ag: NONREACTIVE

## 2023-09-08 LAB — AMMONIA: Ammonia: 24 umol/L (ref 9–35)

## 2023-09-08 LAB — GLUCOSE, CAPILLARY: Glucose-Capillary: 101 mg/dL — ABNORMAL HIGH (ref 70–99)

## 2023-09-08 LAB — MAGNESIUM: Magnesium: 2.8 mg/dL — ABNORMAL HIGH (ref 1.7–2.4)

## 2023-09-08 LAB — MRSA NEXT GEN BY PCR, NASAL: MRSA by PCR Next Gen: NOT DETECTED

## 2023-09-08 MED ORDER — DEXTROSE 50 % IV SOLN
INTRAVENOUS | Status: AC
  Filled 2023-09-08: qty 50

## 2023-09-08 MED ORDER — DEXTROSE 50 % IV SOLN
12.5000 g | INTRAVENOUS | Status: AC
  Administered 2023-09-08: 12.5 g via INTRAVENOUS
  Filled 2023-09-08: qty 50

## 2023-09-08 MED ORDER — LORAZEPAM 2 MG/ML IJ SOLN: 2.0000 mg | INTRAMUSCULAR | Status: DC | PRN

## 2023-09-08 MED ORDER — HYDRALAZINE HCL 25 MG PO TABS
25.0000 mg | ORAL_TABLET | Freq: Three times a day (TID) | ORAL | Status: DC | PRN
  Administered 2023-09-08: 25 mg via ORAL
  Filled 2023-09-08: qty 1

## 2023-09-08 MED ORDER — DEXTROSE 10 % IV SOLN: INTRAVENOUS | Status: DC

## 2023-09-08 NOTE — Procedures (Signed)
Patient Name: [name]  MRN: [id number]  Epilepsy Attending: [name]  Referring Physician/Provider: Jefferson Fuel, MD  Duration: 09/07/2023 1744 to 09/08/2023 2300  Patient history: 73 year old man with a past medical history of DM2, end-stage renal disease on HD Monday Wednesday Friday who presented with R focal seizure in the setting of missing dialysis x11 days. EEG to evaluate for seizure  Level of alertness: Awake, asleep  AEDs during EEG study: LEV  Technical aspects: This EEG study was done with scalp electrodes positioned according to the 10-20 International system of electrode placement. Electrical activity was reviewed with band pass filter of 1-70Hz , sensitivity of 7 uV/mm, display speed of 69mm/sec with a 60Hz  notched filter applied as appropriate. EEG data were recorded continuously and digitally stored.  Video monitoring was available and reviewed as appropriate.  Description: No posterior dominant rhythm was seen. Sleep was characterized by sleep spindles (12 to 14 Hz), maximal frontocentral region. EEG showed continuous generalized polymorphic sharply contoured 3 to 6 Hz theta-delta slowing. Generalized periodic discharges with triphasic morphology were also noted at 1 to 1.5 Hz.  Hyperventilation and photic stimulation were not performed.     EEG was disconnected between 09/08/2023 1503 to 2011 for HD  ABNORMALITY -Periodic discharges with triphasic morphology, generalized - Continuous slow, generalized  IMPRESSION: This study is suggestive of moderate diffuse encephalopathy most likely secondary to toxic-metabolic causes. No seizures or definite epileptiform discharges were seen throughout the recording.  Stefannie Defeo Annabelle Harman

## 2023-09-08 NOTE — Progress Notes (Signed)
Subjective: No further seizures.  Per RN patient is not answering questions but did say some things earlier like when nurse asked what his agent is he responded "it is in the chart"  ROS: negative except above  Examination  Vital signs in last 24 hours: Temp:  [96.8 F (36 C)-97.9 F (36.6 C)] 97.9 F (36.6 C) (01/28 0645) Pulse Rate:  [58-72] 69 (01/28 0915) Resp:  [12-23] 18 (01/28 0915) BP: (125-171)/(58-95) 167/90 (01/28 0915) SpO2:  [91 %-100 %] 100 % (01/28 0915) Weight:  [75.6 kg] 75.6 kg (01/28 0154)  General: lying in bed, NAD Neuro: Awake, alert, does not answer questions (this is likely volitional) does follow commands, tracks examiner in room, cranial nerves appear grossly intact, spontaneously moving all 4 extremities with antigravity strength but does not participate in strength testing  Basic Metabolic Panel: Recent Labs  Lab 09/07/23 1619 09/07/23 1641 09/07/23 1705 09/07/23 2359 09/08/23 0247  NA 135 137 136 135 137  K 4.7 4.5 4.3 5.2* 5.2*  CL 101 92*  --   --  92*  CO2  --  22  --   --  25  GLUCOSE 100* 100*  --   --  83  BUN 94* 100*  --   --  103*  CREATININE 15.00* 14.03*  --   --  15.49*  CALCIUM  --  8.7*  --   --  8.7*  MG  --  2.8*  --   --  2.8*    CBC: Recent Labs  Lab 09/07/23 1619 09/07/23 1641 09/07/23 1705 09/07/23 2359 09/08/23 0247  WBC  --  9.3  --   --  8.3  NEUTROABS  --  7.9*  --   --  6.8  HGB 14.3 12.8* 14.3 12.9* 11.6*  HCT 42.0 39.7 42.0 38.0* 36.8*  MCV  --  80.9  --   --  82.5  PLT  --  93*  --   --  82*     Coagulation Studies: Recent Labs    09/07/23 1641  LABPROT 17.7*  INR 1.4*    Imaging personally reviewed  CT head without contrast 09/07/2023:Possible loss of gray-white differentiation in the bilateral anterior frontal lobes, likely artifactual. No other evidence of acute infarct. No acute intracranial hemorrhage.    ASSESSMENT AND PLAN: 73 year old male who presented with focal convulsive status  epilepticus in the setting of dialysis for 11 days.  Focal convulsive status epilepticus, resolved -Etiology: Likely due to missing dialysis  Recommendations -Continue Keppra 500 mg daily -Continue LTM EEG till after dialysis -Will DC LTM and obtain MRI brain without contrast after dialysis to look for any acute abnormalities -Continue seizure precautions -As needed IV Ativan for clinical seizures  I have spent a total of  36 minutes with the patient reviewing hospital notes,  test results, labs and examining the patient as well as establishing an assessment and plan that was discussed personally with the patient.  > 50% of time was spent in direct patient care.         Lindie Spruce Epilepsy [facility] For questions after 5pm please refer to AMION to reach the Neurologist on call

## 2023-09-08 NOTE — ED Notes (Signed)
Dr. Idelle Leech notified of hypoglycemia; pt alert at this time but only following some commands

## 2023-09-08 NOTE — Progress Notes (Signed)
Nephrology Follow-Up Consult note   Assessment/Recommendations: [name] is a/an 73 y.o. male with a past medical history significant for ESRD, admitted for seizures.     Outpatient HD orders:  [name]  MWF  4 hours Noncompliant - skips HD every Wednesday against medical advice  400 BF 800 DF EDW 73.3 kg  Last tx 1/27 - left at 74 kg - treatment ended prematurely as above  AV graft 08/28/23 with post weight of 73.2 kg.  2 K/2 Calcium Meds: calcitriol 4 mcg three times a week; sensipar 30 mg three times a week with HD; venofer 100 mg IV with HD x 10; mircera 100 mcg every 2 weeks (last given 08/19/23; due today however didn't get outpatient per charting)   # Seizure  - Appreciate neurology - anti-epileptics per neurology   # Altered mental status  - Post-ictal as well as s/p PRN and with some degree of uremia - Optimize seizure treatment as above  - HD today then again tomorrow per schedule - seems to be improving   # ESRD - Serial HD. HD today and then per MWF schedule    # HTN  - improved control; hypotension earlier after a PRN for seizures    # Anemia of CKD  - Hgb at goal   # Secondary hyperparathyroidism, renal  - Can resume binders and calcitriol once taking PO     Recommendations conveyed to primary service.    [name] [facility] 09/08/2023 9:28 AM  ___________________________________________________________  CC: Seizure  Interval History/Subjective: Patient refused to answer most questions today but denied complaints.   Medications:  Current Facility-Administered Medications  Medication Dose Route Frequency Provider Last Rate Last Admin   acetaminophen (TYLENOL) tablet 650 mg  650 mg Oral Q6H PRN [name], [name], MD       Or   acetaminophen (TYLENOL) suppository 650 mg  650 mg Rectal Q6H PRN [name], [name], MD       Chlorhexidine Gluconate Cloth 2 % PADS 6 each  6 each Topical Q0600 [name], MD        dextrose 10 % infusion   Intravenous Continuous [name], MD 50 mL/hr at 09/08/23 0824 New Bag at 09/08/23 0824   dextrose 50 % solution            heparin injection 5,000 Units  5,000 Units Subcutaneous Q8H [name], [name], MD   5,000 Units at 09/08/23 0551   hydrALAZINE (APRESOLINE) injection 5 mg  5 mg Intravenous Q4H PRN [name], [name], MD       insulin aspart (novoLOG) injection 0-15 Units  0-15 Units Subcutaneous Q4H [name], [name], MD       levETIRAcetam (KEPPRA) IVPB 500 mg/100 mL premix  500 mg Intravenous Q24H Jefferson Fuel, MD       And   levETIRAcetam (KEPPRA) 250 mg in sodium chloride 0.9 % 100 mL IVPB  250 mg Intravenous Q dialysis Jefferson Fuel, MD       ondansetron [facility]) tablet 4 mg  4 mg Oral Q6H PRN [name], MD       Or   ondansetron (ZOFRAN) injection 4 mg  4 mg Intravenous Q6H PRN [name], [name], MD       sodium chloride flush (NS) 0.9 % injection 3 mL  3 mL Intravenous Once [name], MD       Current Outpatient Medications  Medication Sig Dispense Refill   acetaminophen (TYLENOL) 500 MG tablet Take 1,000 mg  by mouth every 8 (eight) hours as needed for moderate pain.     amLODipine (NORVASC) 5 MG tablet Take 5 mg by mouth daily.     carvedilol (COREG) 25 MG tablet Take 25 mg by mouth 2 (two) times daily with a meal.     ezetimibe-simvastatin (VYTORIN) 10-10 MG tablet Take 1 tablet by mouth at bedtime. 90 tablet 1   hydrALAZINE (APRESOLINE) 25 MG tablet Take 25 mg by mouth 2 (two) times daily.     LOKELMA 10 g PACK packet Take 10 g by mouth 2 (two) times a week.     losartan (COZAAR) 25 MG tablet Take 25 mg by mouth in the morning and at bedtime.     meloxicam (MOBIC) 15 MG tablet TAKE 1 TABLET BY MOUTH ONCE DAILY FOR 10 DAYS 10 tablet 0   sevelamer carbonate (RENVELA) 800 MG tablet Take 1,600 mg by mouth 3 (three) times daily with meals.     sildenafil (REVATIO) 20 MG tablet Take 1 tablet (20 mg total) by mouth daily. 10 tablet 5   VELPHORO 500  MG chewable tablet Chew 1,000 mg by mouth 3 (three) times daily.        Review of Systems: 10 systems reviewed and negative except per interval history/subjective  Physical Exam: Vitals:   09/08/23 0745 09/08/23 0915  BP: (!) 170/90 (!) 167/90  Pulse:  69  Resp: 20 18  Temp:    SpO2:  100%   No intake/output data recorded. No intake or output data in the 24 hours ending 09/08/23 1610 Constitutional: well-appearing, no acute distress ENMT: ears and nose without scars or lesions, MMM CV: normal rate, no edema Respiratory: clear to auscultation, normal work of breathing Gastrointestinal: soft, non-tender, no palpable masses or hernias Skin: no visible lesions or rashes Psych: alert, mood normal, blunted affect   Test Results I personally reviewed new and old clinical labs and radiology tests Lab Results  Component Value Date   NA 137 09/08/2023   K 5.2 (H) 09/08/2023   CL 92 (L) 09/08/2023   CO2 25 09/08/2023   BUN 103 (H) 09/08/2023   CREATININE 15.49 (H) 09/08/2023   GFR 44.63 (L) 01/18/2018   GLU 102 03/29/2020   CALCIUM 8.7 (L) 09/08/2023   ALBUMIN 2.8 (L) 09/08/2023   PHOS 9.8 (H) 12/16/2011    CBC Recent Labs  Lab 09/07/23 1641 09/07/23 1705 09/07/23 2359 09/08/23 0247  WBC 9.3  --   --  8.3  NEUTROABS 7.9*  --   --  6.8  HGB 12.8* 14.3 12.9* 11.6*  HCT 39.7 42.0 38.0* 36.8*  MCV 80.9  --   --  82.5  PLT 93*  --   --  82*

## 2023-09-08 NOTE — ED Notes (Addendum)
Pt uncooperative during assessment; when asking pt's age, pt states "you should have that in the chart"; this RN provided education regarding the importance of a neurological assessment; pt continues to be uncooperative with assessment; Dr. Idelle Leech notified

## 2023-09-08 NOTE — ED Notes (Addendum)
PT was very uncooperative when this RN was telling the PT about the medication I was giving. I also informed the PT that he has a room that is ready upstairs. PT still not responding.

## 2023-09-08 NOTE — Progress Notes (Addendum)
TRH night cross cover note:   I was notified by RN that peripheral IV access has been lost on this patient.  RN has placed a consult request to the IV team for assistance in establishing peripheral IV access.  In the meantime, the patient's blood pressure is noted to be 179/88, without any oral prn blood pressure medications ordered at this time.  He has prn IV hydralazine ordered.  I subsequently ordered hydralazine 25 mg p.o. every 8 hours as needed for systolic blood pressure greater than 170 mmHg, with plans to discontinue this po prn hydralazine order once peripheral access has been reestablished.  Update: PIV access has been reestablished.  I subsequently discontinued the order for prn PO hydralazine and left the preceding order for prn IV hydralazine.      Newton Pigg, DO Hospitalist

## 2023-09-08 NOTE — ED Notes (Signed)
Pt arrives to room 1 in ed green @ 2235

## 2023-09-08 NOTE — Inpatient Diabetes Management (Signed)
Inpatient Diabetes Program Recommendations  AACE/ADA: New Consensus Statement on Inpatient Glycemic Control (2015)  Target Ranges:  Prepandial:   less than 140 mg/dL      Peak postprandial:   less than 180 mg/dL (1-2 hours)      Critically ill patients:  140 - 180 mg/dL   Lab Results  Component Value Date   GLUCAP 78 09/08/2023   HGBA1C 4.9 09/07/2023    Review of Glycemic Control  Latest Reference Range & Units 09/07/23 16:10 09/07/23 19:15 09/08/23 00:05 09/08/23 05:33 09/08/23 06:42  Glucose-Capillary 70 - 99 mg/dL 161 (H) 90 84 69 (L) 78   Diabetes history: DM 2 Outpatient Diabetes medications: none Current orders for Inpatient glycemic control:  Novolog 0-15 units Q4 hours  Renal function elevated. Glucose trends 107 and below.  Inpatient Diabetes Program Recommendations:    -   Reduce Novolog correction scale to 0-6 units q4 hours (starting at 151)  Thanks,  Christena Deem RN, MSN, BC-ADM Inpatient Diabetes Coordinator Team Pager [phone] (8a-5p)

## 2023-09-08 NOTE — Progress Notes (Signed)
Needles removed from right upper arm fistula. No complications.

## 2023-09-08 NOTE — Progress Notes (Signed)
EEG maint complete. NW/GMD

## 2023-09-08 NOTE — ED Notes (Signed)
Verbal order to use soft restraints as needed. Restraints are not needed at this time and have not been applied.

## 2023-09-08 NOTE — Evaluation (Signed)
Clinical/Bedside Swallow Evaluation Patient Details  Name: [name] MRN: [id number] Date of Birth: 1950-10-12  Today's Date: 09/08/2023 Time: SLP Start Time (ACUTE ONLY): 1053 SLP Stop Time (ACUTE ONLY): 1102 SLP Time Calculation (min) (ACUTE ONLY): 9 min  Past Medical History:  Past Medical History:  Diagnosis Date   Anemia    Anemia    Benign colon polyp 08/11/2010   Clot    STATED TOLD IN APRIL CLOT TO LEFT SHOULDER. 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   HTN (hypertension)    Hyperlipidemia    Renal failure    Past Surgical History:  Past Surgical History:  Procedure Laterality Date   AV FISTULA PLACEMENT  01/19/2012   Procedure: ARTERIOVENOUS (AV) FISTULA CREATION;  Surgeon: Fransisco Hertz, MD;  Location: [facility] OR;  Service: Vascular;  Laterality: Right;  Right Brachio-cephalic arteriovenous fistula   AV FISTULA PLACEMENT Right 07/18/2021   Procedure: INSERTION OF RIGHT UPPER EXTREMITY ARTERIOVENOUS (AV) GORE-TEX GRAFT;  Surgeon: Leonie Douglas, MD;  Location: [facility] OR;  Service: Vascular;  Laterality: Right;  PERIPHERAL NERVE BLOCK   AV FISTULA PLACEMENT, RADIOCEPHALIC  12/31/10   Right arm   INSERTION OF DIALYSIS CATHETER  01/19/2012   Procedure: INSERTION OF DIALYSIS CATHETER;  Surgeon: Fransisco Hertz, MD;  Location: [facility] OR;  Service: Vascular;  Laterality: N/A;  right internal jugular vein   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   UPPER EXTREMITY VENOGRAPHY Right 06/11/2021   Procedure: UPPER EXTREMITY VENOGRAPHY;  Surgeon: Nada Libman, MD;  Location: [facility] INVASIVE CV LAB;  Service: Cardiovascular;  Laterality: Right;   VENOGRAM Left 05/30/2021   Procedure: DIAGNOSTIC CENTRAL VENOGRAM;  Surgeon: Victorino Sparrow, MD;  Location: [facility] OR;  Service: Vascular;  Laterality: Left;   HPI:  [name] is a 73 year old male who presented from HD,  after having missed 11 days for being to weak to go out, as a code stroke. Had seizures in ED. Heat CT 1/27 without acute findings.  CXR 1/27: "Cardiomegaly with pulmonary edema and small bilateral pleural  effusions."  Pt with PMH significant for ESRD on HD MWF, HTN, PAD, HLD.    Assessment / Plan / Recommendation  Clinical Impression  Pt presents with functional swallowing as assessed clinically, but did exhibit some behavior barriers to PO intkae.  Pt tolerated serial straw sips of thin liquid without overt s/s of aspiration.  He did not exhibite clincial s/s of aspiration with puree or solid textures.  Pt did not follow directions for OME and would not open mouth for SLP to visualize oral cavity.  SLP checked for pocketing via digital sweep which suggested adequate oral clearance.  Pt may have decreased PO intake given his reluctance to participate in today's evaluation, but he appears safe for POs should he be agreeable.  SLP to follow for tolerance.    Recommend regular texture diet with thin liquids.   SLP Visit Diagnosis: Dysphagia, unspecified (R13.10)    Aspiration Risk  No limitations    Diet Recommendation Regular;Thin liquid    Liquid Administration via: Straw Medication Administration:  (as tolerated) Supervision: Staff to assist with self feeding Compensations: Slow rate;Small sips/bites Postural Changes: Seated upright at 90 degrees    Other  Recommendations Oral Care Recommendations: Oral care BID    Recommendations for follow up therapy are one  component of a multi-disciplinary discharge planning process, led by the attending physician.  Recommendations may be updated based on patient status, additional functional criteria and insurance authorization.  Follow up Recommendations No SLP follow up      Assistance Recommended at Discharge    Functional Status Assessment Patient has had a recent decline in their functional status and demonstrates the ability to make  significant improvements in function in a reasonable and predictable amount of time.  Frequency and Duration min 2x/week  2 weeks       Prognosis Prognosis for improved oropharyngeal function: Good      Swallow Study   General Date of Onset: 09/07/23 HPI: [name] is a 73 year old male who presented from HD, after having missed 11 days for being to weak to go out, as a code stroke. Had seizures in ED. Heat CT 1/27 without acute findings.  CXR 1/27: "Cardiomegaly with pulmonary edema and small bilateral pleural  effusions."  Pt with PMH significant for ESRD on HD MWF, HTN, PAD, HLD. Type of Study: Bedside Swallow Evaluation Previous Swallow Assessment: None Diet Prior to this Study: NPO Temperature Spikes Noted: No Respiratory Status: Room air History of Recent Intubation: No Behavior/Cognition: Alert;Requires cueing;Doesn't follow directions Oral Cavity Assessment:  (Unable to assess) Oral Care Completed by SLP: No Oral Cavity - Dentition: Adequate natural dentition (unable to assess) Patient Positioning: Partially reclined Baseline Vocal Quality: Not observed Volitional Cough: Cognitively unable to elicit Volitional Swallow: Unable to elicit    Oral/Motor/Sensory Function Overall Oral Motor/Sensory Function:  (Unable to assess)   Ice Chips Ice chips: Not tested Other Comments: purse lips to presentation   Thin Liquid Thin Liquid: Within functional limits Presentation: Straw    Nectar Thick Nectar Thick Liquid: Not tested   Honey Thick Honey Thick Liquid: Not tested   Puree Puree: Within functional limits Presentation: Spoon   Solid     Solid: Within functional limits Presentation:  (SLP fed)      Kerrie Pleasure, MA, CCC-SLP Acute Rehabilitation Services Office: [phone] 09/08/2023,11:40 AM

## 2023-09-08 NOTE — ED Notes (Addendum)
Dr. Idelle Leech at bedside; pt moving all 4 extremities

## 2023-09-08 NOTE — Progress Notes (Signed)
Patient transported to HD on monitor, in the bed. Wife at bedside. Consent obtained. No distress noted.

## 2023-09-08 NOTE — Progress Notes (Addendum)
PROGRESS NOTE    MASSIMILIANO ROHLEDER  ZOX:[id number] DOB: May 19, 1951 DOA: 09/07/2023 PCP: Etta Grandchild, MD   Brief Narrative: This 73 year old male with PMH significant for ESRD on HD MWF, HTN, PAD, HLD.  Patient has missed his last 11 days of hemodialysis per wife because patient was too weak "to go out.  Last week he had 2 episodes of nausea and vomiting.  His appetite has been decreased.  Over the past week gotten progressively weaker, did complain of a headache, resolved with Tylenol.  No reports of fever or chills.  On arrival to hemodialysis patient noted to be weak, He was taken via wheelchair.  2-1/2 hours into hemodialysis he was noted to be aphasic and having abnormal movements in the right upper extremity.  He was sent to the ER as a code stroke.  In ER patient had seizures, He was given IM Versed and loaded with IV Keppra 1000 mg.  Had episode of hypoxemia , SPO2 70s, and hypotension systolic blood pressures to the 60s.  He was transiently placed on a nonrebreather.  Given 1 L IV fluid bolus.  BP responsive to fluids.  Since then patient has been poorly responsive with sonorus breathing.  Stat CT head done and negative.  Continuous EEG reviewed by Neurology, No evidence of seizures. Labs:  AST 169, ALT 525, AST 178 [new], platelets 93, INR 1.4, CXR pulmonary edema mild.  Patient was admitted for further evaluation, Nephrology and neurology is consulted.   Assessment & Plan:   Principal Problem:   Somnolence Active Problems:   Hypertension   End stage renal disease (HCC)   Dyslipidemia, goal LDL below 100   PAD (peripheral artery disease) (HCC)   (HFpEF) heart failure with preserved ejection fraction (HCC)   T2DM (type 2 diabetes mellitus) (HCC)   Elevated LFTs  Acute metabolic encephalopathy likely multifactorial: Differential diagnosis: Seizures, postictal, CVA, uremia, infection. CT head normal. Patient has not completed hemodialysis,  likely uremia. There is no evidence of  infection. Respiratory panel, hepatitis panel normal. Chest x-ray showed cardiomegaly with pulmonary edema. Patient seems improving. Continue to monitor clinical status. Continue hemodialysis as per schedule.  Hyperkalemia:  Likely due to missed hemodialysis.   Continue dialysis as per nephrology  Seizure disorder: Patient was loaded with IV Keppra. Neurology recommended to continue Keppra twice daily. EEG > No evidence of seizures.  Continuous EEG underway. If patient does not return to baseline and at that time still has persistent focal deficit,  would consider MRI.  ESRD on hemodialysis: Nephrology on board, hemodialysis today and then per MWF as scheduled.  Essential hypertension: Blood pressure is controlled.  Continue Lopressor as needed  Hyperlipidemia: LDL below 100.  Statin on hold.  Elevated  LFTs: Abdominal ultrasound normal.  Ammonia level normal,  Continue to monitor LFTs  Anemia of CKD Hemoglobin at baseline.:  Thrombocytopenia: No signs of any bleeding.  Continue to monitor platelet count.   DVT prophylaxis: Heparin Code Status: Full code Family Communication: No family at bedside Disposition Plan:    Status is: Inpatient Remains inpatient appropriate because: Severity of illness   Consultants:  Neurology Nephrology  Procedures: Hemodialysis  Antimicrobials:  Anti-infectives (From admission, onward)    None      Subjective: Patient was seen and examined at bedside.  Overnight events noted.   Patient is able to move all 4 extremities,  Patient is alert and responding appropriately.   Objective: Vitals:   09/08/23 0915 09/08/23 1015 09/08/23 1030 09/08/23  1039  BP: (!) 167/90 (!) 170/88    Pulse: 69  72   Resp: 18 19 18    Temp:    97.9 F (36.6 C)  TempSrc:    Oral  SpO2: 100%  94%   Weight:      Height:       No intake or output data in the 24 hours ending 09/08/23 1131 Filed Weights   09/07/23 1600 09/08/23 0154  Weight:  75.6 kg 75.6 kg    Examination:  General exam: Appears calm and comfortable, deconditioned, not in any acute distress. Respiratory system: Clear to auscultation. Respiratory effort normal.  RR 16 Cardiovascular system: S1 & S2 heard, RRR. No JVD, murmurs, rubs, gallops or clicks.  Gastrointestinal system: Abdomen is non distended, soft and nontender. Normal bowel sounds heard. Central nervous system: Alert and oriented X 3. No focal neurological deficits. Extremities: No edema, no cyanosis, no clubbing. Skin: No rashes, lesions or ulcers Psychiatry: Judgement and insight appear normal. Mood & affect appropriate.     Data Reviewed: I have personally reviewed following labs and imaging studies  CBC: Recent Labs  Lab 09/07/23 1619 09/07/23 1641 09/07/23 1705 09/07/23 2359 09/08/23 0247  WBC  --  9.3  --   --  8.3  NEUTROABS  --  7.9*  --   --  6.8  HGB 14.3 12.8* 14.3 12.9* 11.6*  HCT 42.0 39.7 42.0 38.0* 36.8*  MCV  --  80.9  --   --  82.5  PLT  --  93*  --   --  82*   Basic Metabolic Panel: Recent Labs  Lab 09/07/23 1619 09/07/23 1641 09/07/23 1705 09/07/23 2359 09/08/23 0247  NA 135 137 136 135 137  K 4.7 4.5 4.3 5.2* 5.2*  CL 101 92*  --   --  92*  CO2  --  22  --   --  25  GLUCOSE 100* 100*  --   --  83  BUN 94* 100*  --   --  103*  CREATININE 15.00* 14.03*  --   --  15.49*  CALCIUM  --  8.7*  --   --  8.7*  MG  --  2.8*  --   --  2.8*   GFR: Estimated Creatinine Clearance: 4.7 mL/min (A) (by C-G formula based on SCr of 15.49 mg/dL (H)). Liver Function Tests: Recent Labs  Lab 09/07/23 1641 09/08/23 0247  AST 169* 127*  ALT 527* 446*  ALKPHOS 178* 161*  BILITOT 1.1 0.9  PROT 6.5 5.8*  ALBUMIN 3.3* 2.8*   Recent Labs  Lab 09/07/23 1641  LIPASE 33   Recent Labs  Lab 09/07/23 2340  AMMONIA 24   Coagulation Profile: Recent Labs  Lab 09/07/23 1641  INR 1.4*   Cardiac Enzymes: No results for input(s): "CKTOTAL", "CKMB", "CKMBINDEX",  "TROPONINI" in the last 168 hours. BNP (last 3 results) No results for input(s): "PROBNP" in the last 8760 hours. HbA1C: Recent Labs    09/07/23 2134  HGBA1C 4.9   CBG: Recent Labs  Lab 09/08/23 0005 09/08/23 0533 09/08/23 0642 09/08/23 0801 09/08/23 0933  GLUCAP 84 69* 78 61* 100*   Lipid Profile: No results for input(s): "CHOL", "HDL", "LDLCALC", "TRIG", "CHOLHDL", "LDLDIRECT" in the last 72 hours. Thyroid Function Tests: Recent Labs    09/07/23 1641  TSH 2.102   Anemia Panel: No results for input(s): "VITAMINB12", "FOLATE", "FERRITIN", "TIBC", "IRON", "RETICCTPCT" in the last 72 hours. Sepsis Labs: Recent Labs  Lab  09/07/23 1707 09/08/23 0247 09/08/23 0531  LATICACIDVEN 2.4* 1.6 1.7    Recent Results (from the past 240 hours)  Resp panel by RT-PCR (RSV, Flu A&B, Covid) Anterior Nasal Swab     Status: None   Collection Time: 09/07/23  9:34 PM   Specimen: Anterior Nasal Swab  Result Value Ref Range Status   SARS Coronavirus 2 by RT PCR NEGATIVE NEGATIVE Final   Influenza A by PCR NEGATIVE NEGATIVE Final   Influenza B by PCR NEGATIVE NEGATIVE Final    Comment: (NOTE) The Xpert Xpress SARS-CoV-2/FLU/RSV plus assay is intended as an aid in the diagnosis of influenza from Nasopharyngeal swab specimens and should not be used as a sole basis for treatment. Nasal washings and aspirates are unacceptable for Xpert Xpress SARS-CoV-2/FLU/RSV testing.  Fact Sheet for Patients: BloggerCourse.com  Fact Sheet for Healthcare Providers: SeriousBroker.it  This test is not yet approved or cleared by the [place] FDA and has been authorized for detection and/or diagnosis of SARS-CoV-2 by FDA under an Emergency Use Authorization (EUA). This EUA will remain in effect (meaning this test can be used) for the duration of the COVID-19 declaration under Section 564(b)(1) of the Act, 21 U.S.C. section 360bbb-3(b)(1), unless  the authorization is terminated or revoked.     Resp Syncytial Virus by PCR NEGATIVE NEGATIVE Final    Comment: (NOTE) Fact Sheet for Patients: BloggerCourse.com  Fact Sheet for Healthcare Providers: SeriousBroker.it  This test is not yet approved or cleared by the [place] FDA and has been authorized for detection and/or diagnosis of SARS-CoV-2 by FDA under an Emergency Use Authorization (EUA). This EUA will remain in effect (meaning this test can be used) for the duration of the COVID-19 declaration under Section 564(b)(1) of the Act, 21 U.S.C. section 360bbb-3(b)(1), unless the authorization is terminated or revoked.  Performed at [facility], [address], [place], [place] [address]    Radiology Studies: Overnight EEG with video Result Date: 09/08/2023 [name], MD     09/08/2023  9:25 AM Patient Name: JEREMIAN WHITBY MRN: [id number] Epilepsy Attending: [name] Referring Physician/Provider: Jefferson Fuel, MD Duration: 09/07/2023 1744 to 09/08/2023 0925 Patient history: 73 year old man with a past medical history of DM2, end-stage renal disease on HD Monday Wednesday Friday who presented with R focal seizure in the setting of missing dialysis x11 days. EEG to evaluate for seizure Level of alertness: Awake, asleep AEDs during EEG study: LEV Technical aspects: This EEG study was done with scalp electrodes positioned according to the 10-20 International system of electrode placement. Electrical activity was reviewed with band pass filter of 1-70Hz , sensitivity of 7 uV/mm, display speed of 50mm/sec with a 60Hz  notched filter applied as appropriate. EEG data were recorded continuously and digitally stored.  Video monitoring was available and reviewed as appropriate. Description: No posterior dominant rhythm was seen. Sleep was characterized by sleep spindles (12 to 14 Hz), maximal frontocentral region. EEG  initially showed continuous generalized polymorphic sharply contoured 3 to 6 Hz theta-delta slowing. Generalized periodic discharges with triphasic morphology were also noted at 1 to 1.5 Hz.  Hyperventilation and photic stimulation were not performed.   ABNORMALITY -Periodic discharges with triphasic morphology, generalized - Continuous slow, generalized IMPRESSION: This study is suggestive of moderate diffuse encephalopathy most likely secondary to toxic-metabolic causes. No seizures or definite epileptiform discharges were seen throughout the recording. Priyanka Annabelle Harman   US Abdomen Limited RUQ (LIVER/GB) Result Date: 09/07/2023 CLINICAL DATA:  Elevated  LFTs. EXAM: ULTRASOUND ABDOMEN LIMITED RIGHT UPPER QUADRANT COMPARISON:  None Available. FINDINGS: Gallbladder: Physiologically distended. No gallstones. Upper normal gallbladder wall thickness of 3 mm, nonspecific in the presence of ascites. No sonographic Murphy sign noted by sonographer. Common bile duct: Diameter: 6 mm, normal. Liver: No focal lesion identified. Within normal limits in parenchymal echogenicity. No convincing capsular nodularity. Portal vein is patent on color Doppler imaging with normal direction of blood flow towards the liver. Other: Moderate right upper quadrant ascites. IMPRESSION: 1. Moderate right upper quadrant ascites. No definite morphologic changes of cirrhosis. 2. Upper normal gallbladder wall thickness is nonspecific in setting of ascites. No gallstones. 3. No biliary dilatation or focal liver lesion. Electronically Signed   By: Narda Rutherford M.D.   On: 09/07/2023 21:58   DG Chest Portable 1 View Result Date: 09/07/2023 CLINICAL DATA:  Altered mental status.  Code stroke EXAM: PORTABLE CHEST 1 VIEW COMPARISON:  10/05/2022 FINDINGS: Similar marked cardiomegaly. Aortic atherosclerotic calcification. Pulmonary vascular congestion and interstitial coarsening. Small bilateral pleural effusions. No pneumothorax. IMPRESSION:  Cardiomegaly with pulmonary edema and small bilateral pleural effusions. Electronically Signed   By: Minerva Fester M.D.   On: 09/07/2023 19:51   CT HEAD CODE STROKE WO CONTRAST Result Date: 09/07/2023 CLINICAL DATA:  Code stroke.  Unresponsive EXAM: CT HEAD WITHOUT CONTRAST TECHNIQUE: Contiguous axial images were obtained from the base of the skull through the vertex without intravenous contrast. RADIATION DOSE REDUCTION: This exam was performed according to the departmental dose-optimization program which includes automated exposure control, adjustment of the mA and/or kV according to patient size and/or use of iterative reconstruction technique. COMPARISON:  None Available. FINDINGS: Brain: Possible loss of gray-white differentiation bilateral anterior frontal lobes (series 2, image 25) is favored to be artifactual. No other evidence of acute infarct. No evidence of acute hemorrhage, mass, mass effect, or midline shift. No hydrocephalus or extra-axial collection. Vascular: No hyperdense vessel. Atherosclerotic calcifications in the intracranial carotid and vertebral arteries. Skull: Negative for fracture or focal lesion. Sinuses/Orbits: Mucosal thickening and mucous retention cysts in the maxillary sinuses. No acute finding in the orbits. Other: The mastoid air cells are well aerated. ASPECTS (Alberta Stroke Program Early CT Score) - Ganglionic level infarction (caudate, lentiform nuclei, internal capsule, insula, M1-M3 cortex): 7 - Supraganglionic infarction (M4-M6 cortex): 3 Total score (0-10 with 10 being normal): 10 IMPRESSION: Possible loss of gray-white differentiation in the bilateral anterior frontal lobes, likely artifactual. No other evidence of acute infarct. No acute intracranial hemorrhage. Imaging results were communicated on 09/07/2023 at 4:35 pm to provider STACK via secure text paging. Electronically Signed   By: Wiliam Ke M.D.   On: 09/07/2023 16:35   Scheduled Meds:  Chlorhexidine  Gluconate Cloth  6 each Topical Q0600   dextrose       heparin  5,000 Units Subcutaneous Q8H   insulin aspart  0-15 Units Subcutaneous Q4H   sodium chloride flush  3 mL Intravenous Once   Continuous Infusions:  dextrose 50 mL/hr at 09/08/23 0824   levETIRAcetam     And   levETIRAcetam       LOS: 1 day    Time spent: 50 mins    [name], MD [facility]   If 7PM-7AM, please contact night-coverage

## 2023-09-08 NOTE — Progress Notes (Signed)
LTM maint complete - no skin breakdown seen. EEG set up after returning from diaylysis and being assigned inpt room. Leads attached. [facility] monitored, Event button test confirmed by [facility].

## 2023-09-08 NOTE — Progress Notes (Signed)
Received patient in bed to unit.  Alert and oriented.  Informed consent signed and in chart.   TX duration:3.5hr  Patient tolerated well.  Transported back to the room  Alert, without acute distress.  Hand-off given to patient's nurse.   Access used: R AVG Access issues: none  Total UF removed: 2000 Medication(s) given: none   09/08/23 1905  Vitals  Temp 97.8 F (36.6 C)  Temp Source Oral  BP (!) 161/93  MAP (mmHg) 111  Pulse Rate 61  ECG Heart Rate 62  Resp 10  Oxygen Therapy  SpO2 100 %  During Treatment Monitoring  Blood Flow Rate (mL/min) 400 mL/min  Arterial Pressure (mmHg) -196.76 mmHg  Venous Pressure (mmHg) 258.57 mmHg  TMP (mmHg) 11.31 mmHg  Ultrafiltration Rate (mL/min) 802 mL/min  Dialysate Flow Rate (mL/min) 300 ml/min  Dialysate Potassium Concentration 2  Dialysate Calcium Concentration 2.5  Duration of HD Treatment -hour(s) 3.47 hour(s)  Cumulative Fluid Removed (mL) per Treatment  1974.71  HD Safety Checks Performed Yes  Intra-Hemodialysis Comments Tx completed  Dialysis Fluid Bolus Normal Saline  Bolus Amount (mL) 300 mL  Post Treatment  Dialyzer Clearance Clear  Liters Processed 84  Fluid Removed (mL) 2000 mL  Tolerated HD Treatment Yes  AVG/AVF Arterial Site Held (minutes) 5 minutes  AVG/AVF Venous Site Held (minutes) 5 minutes      Freddi Starr, RN [facility]

## 2023-09-08 NOTE — TOC Initial Note (Signed)
Transition of Care [facility]) - Initial/Assessment Note    Patient Details  Name: [name] MRN: [id number] Date of Birth: 1950/11/23  Transition of Care [facility]) CM/SW Contact:    Lawerance Sabal, RN Phone Number: 09/08/2023, 2:19 PM  Clinical Narrative:                  Patient admitted from, lives w wife, after prolonged time period of missing HD, with weakness and possible seizure at HD.  Patient has insurance coverage and primary care listed.  TOC following through progression rounds.  Expected Discharge Plan: Home/Self Care Barriers to Discharge: Continued Medical Work up   Patient Goals and CMS Choice            Expected Discharge Plan and Services       Living arrangements for the past 2 months: Single Family Home                                      Prior Living Arrangements/Services Living arrangements for the past 2 months: Single Family Home Lives with:: Spouse                   Activities of Daily Living   ADL Screening (condition at time of admission) Independently performs ADLs?: No Does the patient have a NEW difficulty with bathing/dressing/toileting/self-feeding that is expected to last >3 days?: No (needs help) Does the patient have a NEW difficulty with getting in/out of bed, walking, or climbing stairs that is expected to last >3 days?: No (needs help) Does the patient have a NEW difficulty with communication that is expected to last >3 days?: No Is the patient deaf or have difficulty hearing?: No Does the patient have difficulty seeing, even when wearing glasses/contacts?: No Does the patient have difficulty concentrating, remembering, or making decisions?: No  Permission Sought/Granted                  Emotional Assessment              Admission diagnosis:  Somnolence [R40.0] Patient Active Problem List   Diagnosis Date Noted   (HFpEF) heart failure with preserved ejection fraction (HCC) 09/07/2023   T2DM (type 2  diabetes mellitus) (HCC) 09/07/2023   Elevated LFTs 09/07/2023   Somnolence 09/07/2023   Acute pain of left shoulder 11/25/2022   Acute on chronic diastolic CHF (congestive heart failure) (HCC) 10/05/2022   Acute respiratory failure with hypoxia (HCC) 10/05/2022   CHF (congestive heart failure) (HCC) 10/05/2022   Need for prophylactic vaccination with combined diphtheria-tetanus-pertussis (DTP) vaccine 12/26/2021   Need for prophylactic vaccination and inoculation against varicella 12/26/2021   PAD (peripheral artery disease) (HCC) 03/12/2021   Prostate cancer screening 03/12/2021   Immunosuppression due to drug therapy (HCC) 09/16/2020   Dyslipidemia, goal LDL below 100 04/18/2020   Acquired ureteral stricture 11/11/2017   Type 2 diabetes mellitus with complication, without long-term current use of insulin (HCC) 05/07/2017   Anemia in chronic kidney disease 02/06/2014   End stage renal disease (HCC) 01/16/2012   ED (erectile dysfunction) 07/30/2011   Routine general medical examination at a health care facility 07/30/2011   Kidney transplant status, cadaveric 01/30/2011   Hypertension 10/16/2010   PCP:  Etta Grandchild, MD Pharmacy:   [facility] - [place] ([place]), [place] - [address] [place] ([place]) [place] [address] Phone: [phone] Fax:  [phone]     Social Drivers of Health (SDOH) Social History: SDOH Screenings   Food Insecurity: Patient Declined (09/08/2023)  Housing: Patient Declined (09/08/2023)  Transportation Needs: Patient Declined (09/08/2023)  Utilities: Patient Declined (09/08/2023)  Depression (PHQ2-9): Low Risk  (11/25/2022)  Social Connections: Patient Declined (09/08/2023)  Tobacco Use: Low Risk  (09/08/2023)   SDOH Interventions:     Readmission Risk Interventions     No data to display

## 2023-09-09 ENCOUNTER — Other Ambulatory Visit (HOSPITAL_COMMUNITY): Payer: Self-pay

## 2023-09-09 ENCOUNTER — Inpatient Hospital Stay (HOSPITAL_COMMUNITY): Payer: Medicare Other

## 2023-09-09 ENCOUNTER — Encounter (HOSPITAL_COMMUNITY): Payer: Self-pay | Admitting: *Deleted

## 2023-09-09 ENCOUNTER — Telehealth (HOSPITAL_COMMUNITY): Payer: Self-pay | Admitting: Pharmacy Technician

## 2023-09-09 DIAGNOSIS — R569 Unspecified convulsions: Secondary | ICD-10-CM | POA: Diagnosis not present

## 2023-09-09 DIAGNOSIS — G40901 Epilepsy, unspecified, not intractable, with status epilepticus: Secondary | ICD-10-CM | POA: Diagnosis not present

## 2023-09-09 DIAGNOSIS — R400 Somnolence: Secondary | ICD-10-CM | POA: Diagnosis not present

## 2023-09-09 LAB — COMPREHENSIVE METABOLIC PANEL
ALT: 359 U/L — ABNORMAL HIGH (ref 0–44)
AST: 124 U/L — ABNORMAL HIGH (ref 15–41)
Albumin: 2.7 g/dL — ABNORMAL LOW (ref 3.5–5.0)
Alkaline Phosphatase: 148 U/L — ABNORMAL HIGH (ref 38–126)
Anion gap: 16 — ABNORMAL HIGH (ref 5–15)
BUN: 57 mg/dL — ABNORMAL HIGH (ref 8–23)
CO2: 25 mmol/L (ref 22–32)
Calcium: 8.6 mg/dL — ABNORMAL LOW (ref 8.9–10.3)
Chloride: 93 mmol/L — ABNORMAL LOW (ref 98–111)
Creatinine, Ser: 9.99 mg/dL — ABNORMAL HIGH (ref 0.61–1.24)
GFR, Estimated: 5 mL/min — ABNORMAL LOW (ref >60)
Glucose, Bld: 108 mg/dL — ABNORMAL HIGH (ref 70–99)
Potassium: 4.4 mmol/L (ref 3.5–5.1)
Sodium: 134 mmol/L — ABNORMAL LOW (ref 135–145)
Total Bilirubin: 1 mg/dL (ref 0.0–1.2)
Total Protein: 5.7 g/dL — ABNORMAL LOW (ref 6.5–8.1)

## 2023-09-09 LAB — CBC
HCT: 38.3 % — ABNORMAL LOW (ref 39.0–52.0)
Hemoglobin: 12.1 g/dL — ABNORMAL LOW (ref 13.0–17.0)
MCH: 26.4 pg (ref 26.0–34.0)
MCHC: 31.6 g/dL (ref 30.0–36.0)
MCV: 83.6 fL (ref 80.0–100.0)
Platelets: 74 10*3/uL — ABNORMAL LOW (ref 150–400)
RBC: 4.58 MIL/uL (ref 4.22–5.81)
RDW: 20 % — ABNORMAL HIGH (ref 11.5–15.5)
WBC: 6.8 10*3/uL (ref 4.0–10.5)
nRBC: 0 % (ref 0.0–0.2)

## 2023-09-09 LAB — PHOSPHORUS: Phosphorus: 6.2 mg/dL — ABNORMAL HIGH (ref 2.5–4.6)

## 2023-09-09 LAB — GLUCOSE, CAPILLARY
Glucose-Capillary: 102 mg/dL — ABNORMAL HIGH (ref 70–99)
Glucose-Capillary: 104 mg/dL — ABNORMAL HIGH (ref 70–99)
Glucose-Capillary: 126 mg/dL — ABNORMAL HIGH (ref 70–99)
Glucose-Capillary: 141 mg/dL — ABNORMAL HIGH (ref 70–99)
Glucose-Capillary: 157 mg/dL — ABNORMAL HIGH (ref 70–99)
Glucose-Capillary: 79 mg/dL (ref 70–99)
Glucose-Capillary: 94 mg/dL (ref 70–99)

## 2023-09-09 LAB — MAGNESIUM: Magnesium: 2.4 mg/dL (ref 1.7–2.4)

## 2023-09-09 LAB — HEPATITIS B SURFACE ANTIBODY, QUANTITATIVE: Hep B S AB Quant (Post): 49.3 m[IU]/mL

## 2023-09-09 MED ORDER — SEVELAMER CARBONATE 800 MG PO TABS
1600.0000 mg | ORAL_TABLET | Freq: Three times a day (TID) | ORAL | Status: DC
  Administered 2023-09-09 – 2023-09-13 (×8): 1600 mg via ORAL
  Filled 2023-09-09 (×9): qty 2

## 2023-09-09 MED ORDER — ANTICOAGULANT SODIUM CITRATE 4% (200MG/5ML) IV SOLN: 5.0000 mL | Status: DC | PRN

## 2023-09-09 MED ORDER — HEPARIN SODIUM (PORCINE) 1000 UNIT/ML DIALYSIS: 1000.0000 [IU] | INTRAMUSCULAR | Status: DC | PRN

## 2023-09-09 MED ORDER — LIDOCAINE HCL (PF) 1 % IJ SOLN: 5.0000 mL | INTRAMUSCULAR | Status: DC | PRN

## 2023-09-09 MED ORDER — LIDOCAINE-PRILOCAINE 2.5-2.5 % EX CREA: 1.0000 | TOPICAL_CREAM | CUTANEOUS | Status: DC | PRN

## 2023-09-09 MED ORDER — CALCITRIOL 0.5 MCG PO CAPS
4.0000 ug | ORAL_CAPSULE | ORAL | Status: DC
  Administered 2023-09-09 – 2023-09-11 (×2): 4 ug via ORAL
  Filled 2023-09-09 (×2): qty 8

## 2023-09-09 MED ORDER — SODIUM CHLORIDE 0.9 % IV SOLN
250.0000 mg | INTRAVENOUS | Status: DC
  Administered 2023-09-09 – 2023-09-11 (×2): 250 mg via INTRAVENOUS
  Filled 2023-09-09 (×3): qty 2.5

## 2023-09-09 MED ORDER — PENTAFLUOROPROP-TETRAFLUOROETH EX AERO: 1.0000 | INHALATION_SPRAY | CUTANEOUS | Status: DC | PRN

## 2023-09-09 MED ORDER — LEVETIRACETAM IN NACL 500 MG/100ML IV SOLN
500.0000 mg | INTRAVENOUS | Status: DC
  Administered 2023-09-09 – 2023-09-12 (×4): 500 mg via INTRAVENOUS
  Filled 2023-09-09 (×4): qty 100

## 2023-09-09 MED ORDER — HYDRALAZINE HCL 20 MG/ML IJ SOLN
5.0000 mg | INTRAMUSCULAR | Status: DC | PRN
  Administered 2023-09-09 – 2023-09-11 (×6): 5 mg via INTRAVENOUS
  Filled 2023-09-09 (×7): qty 1

## 2023-09-09 MED ORDER — CINACALCET HCL 30 MG PO TABS
30.0000 mg | ORAL_TABLET | ORAL | Status: DC
  Administered 2023-09-09 – 2023-09-11 (×2): 30 mg via ORAL
  Filled 2023-09-09 (×2): qty 1

## 2023-09-09 MED ORDER — ALTEPLASE 2 MG IJ SOLR: 2.0000 mg | Freq: Once | INTRAMUSCULAR | Status: DC | PRN

## 2023-09-09 NOTE — Progress Notes (Signed)
Received patient in bed.Awake,alert and oriented x 3. Consent verified.  Access used.: Right arm AVG that worked well.  Duration of treatment: 2. 5 hours.  Net uf. Tolerated 2 liters.  Tolerated treatment.  Hand off to the patient's nurse. Back into his room with stable medical condition via transporter.

## 2023-09-09 NOTE — Progress Notes (Signed)
vLTM discontinued  No skin breakdown noted at all skin sites.  [facility] notiified

## 2023-09-09 NOTE — Telephone Encounter (Signed)
Patient Product/process development scientist completed.    The patient is insured through [employer]. Patient has Medicare and is not eligible for a copay card, but may be able to apply for patient assistance or Medicare RX Payment Plan (Patient Must reach out to their plan, if eligible for payment plan), if available.    Ran test claim for Valtoco 20 mg and Requires Prior Authorization   This test claim was processed through [employer]- copay amounts may vary at other pharmacies due to [employer], or as the patient moves through the different stages of their insurance plan.     Roland Earl, CPHT Pharmacy Technician III Certified Patient Advocate [facility] Patient Advocate Team Direct Number: [phone]  Fax: [phone]

## 2023-09-09 NOTE — Progress Notes (Signed)
Pt receives out-pt HD at [facility] on MWF. Will assist as needed.   Olivia Canter Renal Navigator [phone]

## 2023-09-09 NOTE — Procedures (Addendum)
Patient Name: BYFORD SCHOOLS  MRN: [id number]  Epilepsy Attending: [name]  Referring Physician/Provider: Jefferson Fuel, MD  Duration: 09/08/2023 2300 to 09/09/2023 0933   Patient history: 73 year old man with a past medical history of DM2, end-stage renal disease on HD Monday Wednesday Friday who presented with R focal seizure in the setting of missing dialysis x11 days. EEG to evaluate for seizure   Level of alertness: Awake, asleep   AEDs during EEG study: LEV   Technical aspects: This EEG study was done with scalp electrodes positioned according to the 10-20 International system of electrode placement. Electrical activity was reviewed with band pass filter of 1-70Hz , sensitivity of 7 uV/mm, display speed of 72mm/sec with a 60Hz  notched filter applied as appropriate. EEG data were recorded continuously and digitally stored.  Video monitoring was available and reviewed as appropriate.   Description: EEG initially showed continuous generalized polymorphic sharply contoured 3 to 6 Hz theta-delta slowing. Generalized periodic discharges with triphasic morphology were also noted at 1 to 1.5 Hz.  Gradually EEG improved and showed posterior dominant rhythm consists of 8 Hz activity of moderate voltage (25-35 uV) seen predominantly in posterior head regions, symmetric and reactive to eye opening and eye closing. Sleep was characterized by sleep spindles (12 to 14 Hz), maximal frontocentral region. Intermittent generalized 3-6hz  theta-delta slowing was noted, at times with triphasic morphology. Hyperventilation and photic stimulation were not performed.      ABNORMALITY - Periodic discharges with triphasic morphology, generalized - Continuous slow, generalized   IMPRESSION: This study was suggestive of moderate diffuse encephalopathy most likely secondary to toxic-metabolic causes. Gradually EEG improved and was suggestive of mild diffuse encephalopathy. No seizures or definite  epileptiform discharges were seen throughout the recording.   Remigio Mcmillon Annabelle Harman

## 2023-09-09 NOTE — Telephone Encounter (Signed)
Pharmacy Patient Advocate Encounter   Received notification that prior authorization for Valtoco 20 MG Dose 10MG /0.1ML liquid is required/requested.   Insurance verification completed.   The patient is insured through [facility] .   Per test claim: PA required; PA submitted to above mentioned insurance via CoverMyMeds Key/confirmation #/EOC [facility] Status is pending

## 2023-09-09 NOTE — Progress Notes (Signed)
PROGRESS NOTE    [name]  ZOX:[id number] DOB: 10-31-1950 DOA: 09/07/2023 PCP: Etta Grandchild, MD   Brief Narrative: This 73 year old male with PMH significant for ESRD on HD MWF, HTN, PAD, HLD.  Patient has missed his last 11 days of hemodialysis per wife because patient was too weak "to go out.  Last week he had 2 episodes of nausea and vomiting.  His appetite has been decreased.  Over the past week gotten progressively weaker, did complain of a headache, resolved with Tylenol.  No reports of fever or chills.  On arrival to hemodialysis patient noted to be weak, He was taken via wheelchair.  2-1/2 hours into hemodialysis he was noted to be aphasic and having abnormal movements in the right upper extremity.  He was sent to the ER as a code stroke.  In ER patient had seizures, He was given IM Versed and loaded with IV Keppra 1000 mg.  Had episode of hypoxemia , SPO2 70s, and hypotension systolic blood pressures to the 60s.  He was transiently placed on a nonrebreather.  Given 1 L IV fluid bolus.  BP responsive to fluids.  Since then patient has been poorly responsive with sonorus breathing.  Stat CT head done and negative.  Continuous EEG reviewed by Neurology, No evidence of seizures. Labs:  AST 169, ALT 525, AST 178 [new], platelets 93, INR 1.4, CXR pulmonary edema mild.  Patient was admitted for further evaluation, Nephrology and neurology is consulted.   Assessment & Plan:   Principal Problem:   Somnolence Active Problems:   Hypertension   End stage renal disease (HCC)   Dyslipidemia, goal LDL below 100   PAD (peripheral artery disease) (HCC)   (HFpEF) heart failure with preserved ejection fraction (HCC)   T2DM (type 2 diabetes mellitus) (HCC)   Elevated LFTs  Acute metabolic encephalopathy likely multifactorial: Differential diagnosis: Seizures, postictal, CVA, uremia, infection. CT head normal. Patient has not completed hemodialysis,  likely uremia. There is no evidence of  infection. Respiratory panel, hepatitis panel normal. Chest x-ray showed cardiomegaly with pulmonary edema. Patient seems improving. Continue to monitor clinical status. Continue hemodialysis as per schedule. EEG > No evidence of seizures.  Neurology recommended MRI brain. If MRI negative , clear to discharge from neurology perspective.  Hyperkalemia:  > Resolved. Likely due to missed hemodialysis.   Continue dialysis as per nephrology  Seizure disorder: Patient was loaded with IV Keppra. Neurology recommended to continue Keppra twice daily. EEG > No evidence of seizures.  Continuous EEG no evidence of seizures. Neurology recommended MRI brain if negative , Neurology will sign off. Neuro recommended Valtoco ( rescue nasal spray). If not covered can prescribe clonazepam 2mg  ODT instead.  ESRD on hemodialysis: Nephrology on board, hemodialysis per MWF as scheduled.  Essential hypertension: Blood pressure is controlled.   Continue Lopressor as needed  Hyperlipidemia: LDL below 100.  Statin on hold.  Elevated  LFTs: Abdominal ultrasound normal.  Ammonia level normal,  Continue to monitor LFT.  Anemia of CKD: Hemoglobin at baseline.:  Thrombocytopenia: No signs of any bleeding.  Continue to monitor platelet count.   DVT prophylaxis: Heparin Code Status: Full code Family Communication: No family at bedside Disposition Plan:    Status is: Inpatient Remains inpatient appropriate because: Severity of illness   Consultants:  Neurology Nephrology  Procedures: Hemodialysis  Antimicrobials:  Anti-infectives (From admission, onward)    None      Subjective: Patient was seen and examined at bedside.  Overnight events  noted.   Patient is able to move all 4 extremities.  Patient reports feeling better,  He was going for hemodialysis.   Objective: Vitals:   09/09/23 0500 09/09/23 0814 09/09/23 0816 09/09/23 0839  BP: (!) 153/77 (!) 177/79 (!) 177/79 (!) 177/79   Pulse: 66 75 76   Resp: (!) 25 (!) 24 (!) 23   Temp:  99 F (37.2 C) 99 F (37.2 C)   TempSrc:  Oral Oral   SpO2: 90% 93% 95%   Weight:      Height:        Intake/Output Summary (Last 24 hours) at 09/09/2023 1328 Last data filed at 09/09/2023 1610 Gross per 24 hour  Intake 1247.2 ml  Output 2000 ml  Net -752.8 ml   Filed Weights   09/08/23 1518 09/08/23 1935 09/09/23 0412  Weight: 74.1 kg 72.1 kg 73.5 kg    Examination:  General exam: Appears comfortable, deconditioned, not in any acute distress. Respiratory system: CTA bilaterally. Respiratory effort normal.  RR 14 Cardiovascular system: S1 & S2 heard, RRR. No JVD, murmurs, rubs, gallops or clicks.  Gastrointestinal system: Abdomen is non distended, soft and nontender. Normal bowel sounds heard. Central nervous system: Alert and oriented X 3. No focal neurological deficits. Extremities: No edema, no cyanosis, no clubbing. Skin: No rashes, lesions or ulcers Psychiatry: Judgement and insight appear normal. Mood & affect appropriate.     Data Reviewed: I have personally reviewed following labs and imaging studies  CBC: Recent Labs  Lab 09/07/23 1641 09/07/23 1705 09/07/23 2359 09/08/23 0247 09/09/23 0236  WBC 9.3  --   --  8.3 6.8  NEUTROABS 7.9*  --   --  6.8  --   HGB 12.8* 14.3 12.9* 11.6* 12.1*  HCT 39.7 42.0 38.0* 36.8* 38.3*  MCV 80.9  --   --  82.5 83.6  PLT 93*  --   --  82* 74*   Basic Metabolic Panel: Recent Labs  Lab 09/07/23 1619 09/07/23 1641 09/07/23 1705 09/07/23 2359 09/08/23 0247 09/09/23 0236  NA 135 137 136 135 137 134*  K 4.7 4.5 4.3 5.2* 5.2* 4.4  CL 101 92*  --   --  92* 93*  CO2  --  22  --   --  25 25  GLUCOSE 100* 100*  --   --  83 108*  BUN 94* 100*  --   --  103* 57*  CREATININE 15.00* 14.03*  --   --  15.49* 9.99*  CALCIUM  --  8.7*  --   --  8.7* 8.6*  MG  --  2.8*  --   --  2.8* 2.4  PHOS  --   --   --   --   --  6.2*   GFR: Estimated Creatinine Clearance: 7.2  mL/min (A) (by C-G formula based on SCr of 9.99 mg/dL (H)). Liver Function Tests: Recent Labs  Lab 09/07/23 1641 09/08/23 0247 09/09/23 0236  AST 169* 127* 124*  ALT 527* 446* 359*  ALKPHOS 178* 161* 148*  BILITOT 1.1 0.9 1.0  PROT 6.5 5.8* 5.7*  ALBUMIN 3.3* 2.8* 2.7*   Recent Labs  Lab 09/07/23 1641  LIPASE 33   Recent Labs  Lab 09/07/23 2340  AMMONIA 24   Coagulation Profile: Recent Labs  Lab 09/07/23 1641  INR 1.4*   Cardiac Enzymes: No results for input(s): "CKTOTAL", "CKMB", "CKMBINDEX", "TROPONINI" in the last 168 hours. BNP (last 3 results) No results for input(s): "PROBNP" in  the last 8760 hours. HbA1C: Recent Labs    09/07/23 2134  HGBA1C 4.9   CBG: Recent Labs  Lab 09/08/23 2015 09/09/23 0013 09/09/23 0410 09/09/23 0814 09/09/23 1150  GLUCAP 79 126* 94 157* 102*   Lipid Profile: No results for input(s): "CHOL", "HDL", "LDLCALC", "TRIG", "CHOLHDL", "LDLDIRECT" in the last 72 hours. Thyroid Function Tests: Recent Labs    09/07/23 1641  TSH 2.102   Anemia Panel: No results for input(s): "VITAMINB12", "FOLATE", "FERRITIN", "TIBC", "IRON", "RETICCTPCT" in the last 72 hours. Sepsis Labs: Recent Labs  Lab 09/07/23 1707 09/08/23 0247 09/08/23 0531  LATICACIDVEN 2.4* 1.6 1.7    Recent Results (from the past 240 hours)  Resp panel by RT-PCR (RSV, Flu A&B, Covid) Anterior Nasal Swab     Status: None   Collection Time: 09/07/23  9:34 PM   Specimen: Anterior Nasal Swab  Result Value Ref Range Status   SARS Coronavirus 2 by RT PCR NEGATIVE NEGATIVE Final   Influenza A by PCR NEGATIVE NEGATIVE Final   Influenza B by PCR NEGATIVE NEGATIVE Final    Comment: (NOTE) The Xpert Xpress SARS-CoV-2/FLU/RSV plus assay is intended as an aid in the diagnosis of influenza from Nasopharyngeal swab specimens and should not be used as a sole basis for treatment. Nasal washings and aspirates are unacceptable for Xpert Xpress  SARS-CoV-2/FLU/RSV testing.  Fact Sheet for Patients: BloggerCourse.com  Fact Sheet for Healthcare Providers: SeriousBroker.it  This test is not yet approved or cleared by the [place] FDA and has been authorized for detection and/or diagnosis of SARS-CoV-2 by FDA under an Emergency Use Authorization (EUA). This EUA will remain in effect (meaning this test can be used) for the duration of the COVID-19 declaration under Section 564(b)(1) of the Act, 21 U.S.C. section 360bbb-3(b)(1), unless the authorization is terminated or revoked.     Resp Syncytial Virus by PCR NEGATIVE NEGATIVE Final    Comment: (NOTE) Fact Sheet for Patients: BloggerCourse.com  Fact Sheet for Healthcare Providers: SeriousBroker.it  This test is not yet approved or cleared by the [place] FDA and has been authorized for detection and/or diagnosis of SARS-CoV-2 by FDA under an Emergency Use Authorization (EUA). This EUA will remain in effect (meaning this test can be used) for the duration of the COVID-19 declaration under Section 564(b)(1) of the Act, 21 U.S.C. section 360bbb-3(b)(1), unless the authorization is terminated or revoked.  Performed at [facility], [address] [place], [place] [address]   MRSA Next Gen by PCR, Nasal     Status: None   Collection Time: 09/08/23  2:15 PM   Specimen: Nasal Mucosa; Nasal Swab  Result Value Ref Range Status   MRSA by PCR Next Gen NOT DETECTED NOT DETECTED Final    Comment: (NOTE) The GeneXpert MRSA Assay (FDA approved for NASAL specimens only), is one component of a comprehensive MRSA colonization surveillance program. It is not intended to diagnose MRSA infection nor to guide or monitor treatment for MRSA infections. Test performance is not FDA approved in patients less than 58 years old. Performed at [facility], [address], [place], [place] [address]    Radiology Studies: Overnight EEG with video Result Date: 09/08/2023 [name], MD     09/09/2023  8:52 AM Patient Name: [name] MRN: [id number] Epilepsy Attending: [name] Referring Physician/Provider: Jefferson Fuel, MD Duration: 09/07/2023 1744 to 09/08/2023 2300 Patient history: 73 year old man with a past medical history of DM2, end-stage  renal disease on HD Monday Wednesday Friday who presented with R focal seizure in the setting of missing dialysis x11 days. EEG to evaluate for seizure Level of alertness: Awake, asleep AEDs during EEG study: LEV Technical aspects: This EEG study was done with scalp electrodes positioned according to the 10-20 International system of electrode placement. Electrical activity was reviewed with band pass filter of 1-70Hz , sensitivity of 7 uV/mm, display speed of 67mm/sec with a 60Hz  notched filter applied as appropriate. EEG data were recorded continuously and digitally stored.  Video monitoring was available and reviewed as appropriate. Description: No posterior dominant rhythm was seen. Sleep was characterized by sleep spindles (12 to 14 Hz), maximal frontocentral region. EEG showed continuous generalized polymorphic sharply contoured 3 to 6 Hz theta-delta slowing. Generalized periodic discharges with triphasic morphology were also noted at 1 to 1.5 Hz.  Hyperventilation and photic stimulation were not performed.   EEG was disconnected between 09/08/2023 1503 to 2011 for HD ABNORMALITY -Periodic discharges with triphasic morphology, generalized - Continuous slow, generalized IMPRESSION: This study is suggestive of moderate diffuse encephalopathy most likely secondary to toxic-metabolic causes. No seizures or definite epileptiform discharges were seen throughout the recording. Priyanka Annabelle Harman   US Abdomen Limited RUQ (LIVER/GB) Result Date: 09/07/2023 CLINICAL DATA:  Elevated LFTs. EXAM: ULTRASOUND ABDOMEN LIMITED  RIGHT UPPER QUADRANT COMPARISON:  None Available. FINDINGS: Gallbladder: Physiologically distended. No gallstones. Upper normal gallbladder wall thickness of 3 mm, nonspecific in the presence of ascites. No sonographic Murphy sign noted by sonographer. Common bile duct: Diameter: 6 mm, normal. Liver: No focal lesion identified. Within normal limits in parenchymal echogenicity. No convincing capsular nodularity. Portal vein is patent on color Doppler imaging with normal direction of blood flow towards the liver. Other: Moderate right upper quadrant ascites. IMPRESSION: 1. Moderate right upper quadrant ascites. No definite morphologic changes of cirrhosis. 2. Upper normal gallbladder wall thickness is nonspecific in setting of ascites. No gallstones. 3. No biliary dilatation or focal liver lesion. Electronically Signed   By: Narda Rutherford M.D.   On: 09/07/2023 21:58   DG Chest Portable 1 View Result Date: 09/07/2023 CLINICAL DATA:  Altered mental status.  Code stroke EXAM: PORTABLE CHEST 1 VIEW COMPARISON:  10/05/2022 FINDINGS: Similar marked cardiomegaly. Aortic atherosclerotic calcification. Pulmonary vascular congestion and interstitial coarsening. Small bilateral pleural effusions. No pneumothorax. IMPRESSION: Cardiomegaly with pulmonary edema and small bilateral pleural effusions. Electronically Signed   By: Minerva Fester M.D.   On: 09/07/2023 19:51   CT HEAD CODE STROKE WO CONTRAST Result Date: 09/07/2023 CLINICAL DATA:  Code stroke.  Unresponsive EXAM: CT HEAD WITHOUT CONTRAST TECHNIQUE: Contiguous axial images were obtained from the base of the skull through the vertex without intravenous contrast. RADIATION DOSE REDUCTION: This exam was performed according to the departmental dose-optimization program which includes automated exposure control, adjustment of the mA and/or kV according to patient size and/or use of iterative reconstruction technique. COMPARISON:  None Available. FINDINGS: Brain:  Possible loss of gray-white differentiation bilateral anterior frontal lobes (series 2, image 25) is favored to be artifactual. No other evidence of acute infarct. No evidence of acute hemorrhage, mass, mass effect, or midline shift. No hydrocephalus or extra-axial collection. Vascular: No hyperdense vessel. Atherosclerotic calcifications in the intracranial carotid and vertebral arteries. Skull: Negative for fracture or focal lesion. Sinuses/Orbits: Mucosal thickening and mucous retention cysts in the maxillary sinuses. No acute finding in the orbits. Other: The mastoid air cells are well aerated. ASPECTS [facility] Stroke Program Early CT Score) - Ganglionic  level infarction (caudate, lentiform nuclei, internal capsule, insula, M1-M3 cortex): 7 - Supraganglionic infarction (M4-M6 cortex): 3 Total score (0-10 with 10 being normal): 10 IMPRESSION: Possible loss of gray-white differentiation in the bilateral anterior frontal lobes, likely artifactual. No other evidence of acute infarct. No acute intracranial hemorrhage. Imaging results were communicated on 09/07/2023 at 4:35 pm to provider STACK via secure text paging. Electronically Signed   By: Wiliam Ke M.D.   On: 09/07/2023 16:35   Scheduled Meds:  calcitRIOL  4 mcg Oral Q M,W,F   Chlorhexidine Gluconate Cloth  6 each Topical Q0600   cinacalcet  30 mg Oral Q M,W,F   heparin  5,000 Units Subcutaneous Q8H   insulin aspart  0-15 Units Subcutaneous Q4H   sevelamer carbonate  1,600 mg Oral TID WC   sodium chloride flush  3 mL Intravenous Once   Continuous Infusions:  dextrose 50 mL/hr at 09/09/23 0701   levETIRAcetam     And   levETIRAcetam       LOS: 2 days    Time spent: 35 mins    [name], MD [facility]   If 7PM-7AM, please contact night-coverage

## 2023-09-09 NOTE — Plan of Care (Signed)
  Problem: Metabolic: Goal: Ability to maintain appropriate glucose levels will improve Outcome: Progressing   Problem: Nutritional: Goal: Maintenance of adequate nutrition will improve Outcome: Progressing   Problem: Clinical Measurements: Goal: Cardiovascular complication will be avoided Outcome: Progressing   Problem: Pain Managment: Goal: General experience of comfort will improve and/or be controlled Outcome: Progressing   Problem: Education: Goal: Knowledge of disease and its progression will improve Outcome: Not Progressing   Problem: Clinical Measurements: Goal: Complications related to the disease process, condition or treatment will be avoided or minimized Outcome: Progressing

## 2023-09-09 NOTE — Progress Notes (Addendum)
Subjective: No acute events overnight.  Wife at bedside.  States patient is pretty close to baseline.  ROS: negative except above Examination  Vital signs in last 24 hours: Temp:  [97.2 F (36.2 C)-99.2 F (37.3 C)] 99 F (37.2 C) (01/29 0816) Pulse Rate:  [61-76] 76 (01/29 0816) Resp:  [10-29] 23 (01/29 0816) BP: (132-187)/(73-113) 177/79 (01/29 0839) SpO2:  [90 %-100 %] 95 % (01/29 0816) Weight:  [72.1 kg-76.4 kg] 73.5 kg (01/29 0412)  General: lying in bed, NAD Neuro: MS: Alert, oriented to place and person but not to time (March), follows commands CN: pupils equal and reactive,  EOMI, face symmetric, tongue midline, normal sensation over face, Motor: 5/5 strength in all 4 extremities Coordination: normal Gait: not tested  Basic Metabolic Panel: Recent Labs  Lab 09/07/23 1619 09/07/23 1641 09/07/23 1705 09/07/23 2359 09/08/23 0247 09/09/23 0236  NA 135 137 136 135 137 134*  K 4.7 4.5 4.3 5.2* 5.2* 4.4  CL 101 92*  --   --  92* 93*  CO2  --  22  --   --  25 25  GLUCOSE 100* 100*  --   --  83 108*  BUN 94* 100*  --   --  103* 57*  CREATININE 15.00* 14.03*  --   --  15.49* 9.99*  CALCIUM  --  8.7*  --   --  8.7* 8.6*  MG  --  2.8*  --   --  2.8* 2.4  PHOS  --   --   --   --   --  6.2*    CBC: Recent Labs  Lab 09/07/23 1641 09/07/23 1705 09/07/23 2359 09/08/23 0247 09/09/23 0236  WBC 9.3  --   --  8.3 6.8  NEUTROABS 7.9*  --   --  6.8  --   HGB 12.8* 14.3 12.9* 11.6* 12.1*  HCT 39.7 42.0 38.0* 36.8* 38.3*  MCV 80.9  --   --  82.5 83.6  PLT 93*  --   --  82* 74*     Coagulation Studies: Recent Labs    09/07/23 1641  LABPROT 17.7*  INR 1.4*    Imaging MRI brain ordered and pending   ASSESSMENT AND PLAN: 73 year old male who presented with focal convulsive status epilepticus in the setting of dialysis for 11 days.   Focal convulsive status epilepticus, resolved -Etiology: Likely due to missing dialysis   Recommendations -Continue Keppra 500  mg daily.  No clinical seizures-after missing dialysis and therefore lower likely provoked.  Can consider weaning off of Keppra in future. -Risk medication: clonazepam 2 mg oral dissolving tablet for seizure lasting more than 2 minutes. Valtoco prior Berkley Harvey was denied -DC LTM EEG -MRI brain without contrast to look for any acute abnormality -Discussed seizure precautions including no driving -Follow-up with neurology in 3 months (order placed) -Discussed plan with patient and wife at bedside -Discussed plan with Dr.Khatri via secure chat  Addendum - MRI brain wo contrast: No acute abnormality. Can't get CTA due to ESRD. Will get MRA head and carotid US to evaluate for vertebral stenosis/occlusion.  Seizure precautions: Per [facility] statutes, patients with seizures are not allowed to drive until they have been seizure-free for six months and cleared by a physician    Use caution when using heavy equipment or power tools. Avoid working on ladders or at heights. Take showers instead of baths. Ensure the water temperature is not too high on the home water heater. Do  not go swimming alone. Do not lock yourself in a room alone (i.e. bathroom). When caring for infants or small children, sit down when holding, feeding, or changing them to minimize risk of injury to the child in the event you have a seizure. Maintain good sleep hygiene. Avoid alcohol.    If patient has another seizure, call 911 and bring them back to the ED if: A.  The seizure lasts longer than 5 minutes.      B.  The patient doesn't wake shortly after the seizure or has new problems such as difficulty seeing, speaking or moving following the seizure C.  The patient was injured during the seizure D.  The patient has a temperature over 102 F (39C) E.  The patient vomited during the seizure and now is having trouble breathing    During the Seizure   - First, ensure adequate ventilation and place patients on the floor on their  left side  Loosen clothing around the neck and ensure the airway is patent. If the patient is clenching the teeth, do not force the mouth open with any object as this can cause severe damage - Remove all items from the surrounding that can be hazardous. The patient may be oblivious to what's happening and may not even know what he or she is doing. If the patient is confused and wandering, either gently guide him/her away and block access to outside areas - Reassure the individual and be comforting - Call 911. In most cases, the seizure ends before EMS arrives. However, there are cases when seizures may last over 3 to 5 minutes. Or the individual may have developed breathing difficulties or severe injuries. If a pregnant patient or a person with diabetes develops a seizure, it is prudent to call an ambulance.     After the Seizure (Postictal Stage)   After a seizure, most patients experience confusion, fatigue, muscle pain and/or a headache. Thus, one should permit the individual to sleep. For the next few days, reassurance is essential. Being calm and helping reorient the person is also of importance.   Most seizures are painless and end spontaneously. Seizures are not harmful to others but can lead to complications such as stress on the lungs, brain and the heart. Individuals with prior lung problems may develop labored breathing and respiratory distress.      I have spent a total of  35 minutes with the patient reviewing hospital notes,  test results, labs and examining the patient as well as establishing an assessment and plan that was discussed personally with the patient.  > 50% of time was spent in direct patient care.      Lindie Spruce Epilepsy [facility] For questions after 5pm please refer to AMION to reach the Neurologist on call

## 2023-09-09 NOTE — Progress Notes (Signed)
Nephrology Follow-Up Consult note   Assessment/Recommendations: [name] is a/an 73 y.o. male with a past medical history significant for ESRD, admitted for seizures.     Outpatient HD orders:  [name]  MWF  4 hours Noncompliant - skips HD every Wednesday against medical advice  400 BF 800 DF EDW 73.3 kg  Last tx 1/27 - left at 74 kg - treatment ended prematurely as above  AV graft 08/28/23 with post weight of 73.2 kg.  2 K/2 Calcium Meds: calcitriol 4 mcg three times a week; sensipar 30 mg three times a week with HD; venofer 100 mg IV with HD x 10; mircera 100 mcg every 2 weeks (last given 08/19/23; due today however didn't get outpatient per charting)   # Seizure  - Appreciate neurology - anti-epileptics per neurology   # Altered mental status  - Post-ictal as well as s/p PRN and with some degree of uremia.  Slightly improved today - Optimize seizure treatment as above  -Dialysis today per schedule - seems to be improving   # ESRD - Serial HD.  Continue dialysis on MWF schedule   # HTN  -Blood pressure acceptable at this time   # Anemia of CKD  - Hgb at goal   # Secondary hyperparathyroidism, renal  - Resume binders, calcitriol, and sensipar    Recommendations conveyed to primary service.    [name] [facility] 09/09/2023 10:02 AM  ___________________________________________________________  CC: Seizure  Interval History/Subjective: Patient denies any complaints.  Tolerated dialysis yesterday with no issues   Medications:  Current Facility-Administered Medications  Medication Dose Route Frequency Provider Last Rate Last Admin   acetaminophen (TYLENOL) tablet 650 mg  650 mg Oral Q6H PRN [name], [name], MD       Or   acetaminophen (TYLENOL) suppository 650 mg  650 mg Rectal Q6H PRN [name], [name], MD       Chlorhexidine Gluconate Cloth 2 % PADS 6 each  6 each Topical Q0600 [name], MD   6 each at 09/09/23 0542    dextrose 10 % infusion   Intravenous Continuous [name], MD 50 mL/hr at 09/09/23 0701 Infusion Verify at 09/09/23 0701   heparin injection 5,000 Units  5,000 Units Subcutaneous Q8H [name], [name], MD   5,000 Units at 09/08/23 0551   hydrALAZINE (APRESOLINE) injection 5 mg  5 mg Intravenous Q4H PRN Howerter, Justin B, DO   5 mg at 09/09/23 0839   insulin aspart (novoLOG) injection 0-15 Units  0-15 Units Subcutaneous Q4H [name], [name], MD   3 Units at 09/09/23 0839   levETIRAcetam (KEPPRA) IVPB 500 mg/100 mL premix  500 mg Intravenous Q24H [name], [facility]       And   levETIRAcetam (KEPPRA) 250 mg in sodium chloride 0.9 % 100 mL IVPB  250 mg Intravenous Q M,W,F-2000 [name], RPH       LORazepam (ATIVAN) injection 2 mg  2 mg Intravenous PRN [name], MD       ondansetron (ZOFRAN) tablet 4 mg  4 mg Oral Q6H PRN [name], [name], MD       Or   ondansetron (ZOFRAN) injection 4 mg  4 mg Intravenous Q6H PRN [name], [name], MD       sodium chloride flush (NS) 0.9 % injection 3 mL  3 mL Intravenous Once [name], MD          Review of Systems: 10 systems reviewed and negative except per  interval history/subjective  Physical Exam: Vitals:   09/09/23 0816 09/09/23 0839  BP: (!) 177/79 (!) 177/79  Pulse: 76   Resp: (!) 23   Temp: 99 F (37.2 C)   SpO2: 95%    Total I/O In: 1147.4 [P.O.:120; I.V.:924.4; IV Piggyback:103] Out: -   Intake/Output Summary (Last 24 hours) at 09/09/2023 1002 Last data filed at 09/09/2023 1478 Gross per 24 hour  Intake 1247.2 ml  Output 2000 ml  Net -752.8 ml   Constitutional: well-appearing, no acute distress ENMT: ears and nose without scars or lesions, MMM CV: normal rate, no edema Respiratory: bilateral chest rise, normal work of breathing Gastrointestinal: soft, non-tender, no palpable masses or hernias Skin: no visible lesions or rashes Psych: alert, mood normal, blunted affect   Test Results I personally reviewed new  and old clinical labs and radiology tests Lab Results  Component Value Date   NA 134 (L) 09/09/2023   K 4.4 09/09/2023   CL 93 (L) 09/09/2023   CO2 25 09/09/2023   BUN 57 (H) 09/09/2023   CREATININE 9.99 (H) 09/09/2023   GFR 44.63 (L) 01/18/2018   GLU 102 03/29/2020   CALCIUM 8.6 (L) 09/09/2023   ALBUMIN 2.7 (L) 09/09/2023   PHOS 6.2 (H) 09/09/2023    CBC Recent Labs  Lab 09/07/23 1641 09/07/23 1705 09/07/23 2359 09/08/23 0247 09/09/23 0236  WBC 9.3  --   --  8.3 6.8  NEUTROABS 7.9*  --   --  6.8  --   HGB 12.8*   < > 12.9* 11.6* 12.1*  HCT 39.7   < > 38.0* 36.8* 38.3*  MCV 80.9  --   --  82.5 83.6  PLT 93*  --   --  82* 74*   < > = values in this interval not displayed.

## 2023-09-10 ENCOUNTER — Inpatient Hospital Stay (HOSPITAL_COMMUNITY): Payer: Medicare Other

## 2023-09-10 ENCOUNTER — Encounter (HOSPITAL_COMMUNITY): Payer: Medicare Other

## 2023-09-10 DIAGNOSIS — R400 Somnolence: Secondary | ICD-10-CM | POA: Diagnosis not present

## 2023-09-10 LAB — GLUCOSE, CAPILLARY
Glucose-Capillary: 111 mg/dL — ABNORMAL HIGH (ref 70–99)
Glucose-Capillary: 109 mg/dL — ABNORMAL HIGH (ref 70–99)
Glucose-Capillary: 152 mg/dL — ABNORMAL HIGH (ref 70–99)
Glucose-Capillary: 101 mg/dL — ABNORMAL HIGH (ref 70–99)
Glucose-Capillary: 110 mg/dL — ABNORMAL HIGH (ref 70–99)

## 2023-09-10 LAB — CBC
HCT: 37.8 % — ABNORMAL LOW (ref 39.0–52.0)
Hemoglobin: 11.9 g/dL — ABNORMAL LOW (ref 13.0–17.0)
MCH: 25.9 pg — ABNORMAL LOW (ref 26.0–34.0)
MCHC: 31.5 g/dL (ref 30.0–36.0)
MCV: 82.2 fL (ref 80.0–100.0)
Platelets: 77 10*3/uL — ABNORMAL LOW (ref 150–400)
RBC: 4.6 MIL/uL (ref 4.22–5.81)
RDW: 19.9 % — ABNORMAL HIGH (ref 11.5–15.5)
WBC: 7.9 10*3/uL (ref 4.0–10.5)
nRBC: 0 % (ref 0.0–0.2)

## 2023-09-10 LAB — BASIC METABOLIC PANEL
Anion gap: 14 (ref 5–15)
BUN: 42 mg/dL — ABNORMAL HIGH (ref 8–23)
CO2: 24 mmol/L (ref 22–32)
Calcium: 8.6 mg/dL — ABNORMAL LOW (ref 8.9–10.3)
Chloride: 93 mmol/L — ABNORMAL LOW (ref 98–111)
Creatinine, Ser: 8.12 mg/dL — ABNORMAL HIGH (ref 0.61–1.24)
GFR, Estimated: 7 mL/min — ABNORMAL LOW (ref >60)
Glucose, Bld: 106 mg/dL — ABNORMAL HIGH (ref 70–99)
Potassium: 4.1 mmol/L (ref 3.5–5.1)
Sodium: 131 mmol/L — ABNORMAL LOW (ref 135–145)

## 2023-09-10 LAB — MAGNESIUM: Magnesium: 2.2 mg/dL (ref 1.7–2.4)

## 2023-09-10 LAB — PHOSPHORUS: Phosphorus: 5.8 mg/dL — ABNORMAL HIGH (ref 2.5–4.6)

## 2023-09-10 MED ORDER — GUAIFENESIN-DM 100-10 MG/5ML PO SYRP
5.0000 mL | ORAL_SOLUTION | ORAL | Status: DC | PRN
  Administered 2023-09-10 (×3): 5 mL via ORAL
  Filled 2023-09-10 (×4): qty 5

## 2023-09-10 MED ORDER — OXYCODONE-ACETAMINOPHEN 5-325 MG PO TABS
1.0000 | ORAL_TABLET | Freq: Three times a day (TID) | ORAL | Status: DC | PRN
  Administered 2023-09-10 – 2023-09-13 (×3): 1 via ORAL
  Filled 2023-09-10 (×4): qty 1

## 2023-09-10 MED ORDER — LOSARTAN POTASSIUM 50 MG PO TABS
50.0000 mg | ORAL_TABLET | Freq: Every day | ORAL | Status: DC
  Administered 2023-09-10: 50 mg via ORAL
  Filled 2023-09-10: qty 1

## 2023-09-10 MED ORDER — GUAIFENESIN ER 600 MG PO TB12
600.0000 mg | ORAL_TABLET | Freq: Two times a day (BID) | ORAL | Status: DC
  Administered 2023-09-10 – 2023-09-13 (×4): 600 mg via ORAL
  Filled 2023-09-10 (×5): qty 1

## 2023-09-10 NOTE — Progress Notes (Signed)
Speech Language Pathology Treatment: Dysphagia  Patient Details Name: [name] MRN: [id number] DOB: 01/22/1951 Today's Date: 09/10/2023 Time: 6213-0865 SLP Time Calculation (min) (ACUTE ONLY): 11 min  Assessment / Plan / Recommendation Clinical Impression  Pt's family present throughout session to assist pt with feeding. Observed with regular solids and thin liquids without overt s/s of dysphagia or aspiration. They report no concerns with current diet. Suspect cognition may continue to affect intake, but overall swallow function appears normal. Continue current diet without SLP f/u.    HPI HPI: [name] is a 73 year old male who presented from HD, after having missed 11 days for being to weak to go out, as a code stroke. Had seizures in ED. Heat CT 1/27 without acute findings.  CXR 1/27: "Cardiomegaly with pulmonary edema and small bilateral pleural  effusions."  Pt with PMH significant for ESRD on HD MWF, HTN, PAD, HLD.      SLP Plan  All goals met      Recommendations for follow up therapy are one component of a multi-disciplinary discharge planning process, led by the attending physician.  Recommendations may be updated based on patient status, additional functional criteria and insurance authorization.    Recommendations  Diet recommendations: Regular;Thin liquid Liquids provided via: Cup;Straw Medication Administration: Whole meds with liquid Supervision: Staff to assist with self feeding;Full supervision/cueing for compensatory strategies Compensations: Slow rate;Small sips/bites Postural Changes and/or Swallow Maneuvers: Seated upright 90 degrees                  Oral care BID   PRN Dysphagia, unspecified (R13.10)     All goals met     Gwynneth Aliment, M.A., CF-SLP Speech Language Pathology, Acute Rehabilitation Services  Secure Chat preferred [phone]   09/10/2023, 11:55 AM

## 2023-09-10 NOTE — Progress Notes (Signed)
RN notified MD patient had cough and Robitussin was not helping. MD gave verbal for Mucinex BID. Orders placed

## 2023-09-10 NOTE — Care Management Important Message (Signed)
Important Message  Patient Details  Name: [name] MRN: [id number] Date of Birth: 02-15-1951   Important Message Given:  Yes - Medicare IM     Dorena Bodo 09/10/2023, 2:47 PM

## 2023-09-10 NOTE — Evaluation (Signed)
Physical Therapy Evaluation Patient Details Name: [name] MRN: [id number] DOB: 01/20/1951 Today's Date: 09/10/2023  History of Present Illness  73 year old male with PMH significant for ESRD on HD MWF, HTN, PAD, HLD.  Patient has missed his last 11 days of hemodialysis per wife because patient was too weak "to go out.  Last week he had 2 episodes of nausea and vomiting.  His appetite has been decreased.  Over the past week gotten progressively weaker, did complain of a headache, resolved with Tylenol.  No reports of fever or chills.  On arrival to hemodialysis patient noted to be weak, He was taken via wheelchair.  2-1/2 hours into hemodialysis he was noted to be aphasic and having abnormal movements in the right upper extremity.  He was sent to the ER as a code stroke.  In ER patient had seizures, He was given IM Versed and loaded with IV Keppra 1000 mg.  Had episode of hypoxemia , SPO2 70s, and hypotension systolic blood pressures to the 60s.  He was transiently placed on a nonrebreather.  Given 1 L IV fluid bolus.  BP responsive to fluids.  Since then patient has been poorly responsive with sonorus breathing.  Stat CT head done and negative.  Continuous EEG reviewed by Neurology, No evidence of seizures.  Clinical Impression  Pt is presenting below baseline level of functioning. Pt was very lethargic throughout session. Pt was supervision to Min A +2 for mobility including bed mobility, sit to stand and side stepping at EOB at [facility]. Pt has poor activity tolerance due to lethargy. Per pt he has family support at home. Due to pt current functional status, home set up and available assistance at home recommending skilled physical therapy services 3x/week in order to address strength, balance and functional mobility to decrease risk for falls, injury and re-hospitalization.         If plan is discharge home, recommend the following: A little help with walking and/or transfers;Assistance with  cooking/housework;Assist for transportation;Help with stairs or ramp for entrance     Equipment Recommendations Rolling walker (2 wheels);BSC/3in1     Functional Status Assessment Patient has had a recent decline in their functional status and demonstrates the ability to make significant improvements in function in a reasonable and predictable amount of time.     Precautions / Restrictions Precautions Precautions: Fall;Other (comment) Precaution Comments: seizure Restrictions Weight Bearing Restrictions Per Provider Order: No      Mobility  Bed Mobility Overal bed mobility: Needs Assistance Bed Mobility: Supine to Sit, Sit to Supine     Supine to sit: Supervision, HOB elevated Sit to supine: Supervision, HOB elevated   General bed mobility comments: VC to initiate task. Increased time to initiate.    Transfers Overall transfer level: Needs assistance Equipment used: 2 person hand held assist Transfers: Sit to/from Stand, Bed to chair/wheelchair/BSC Sit to Stand: Min assist, +2 physical assistance           General transfer comment: Pt completed sit to stand at EOB then performed lateral side steps towards HOB. VC for safety awareness and technique as needed. Pt did not control his decent back to sitting.    Ambulation/Gait   Pre-gait activities: side stepping at edge of bed with 2 person min A; low foot clearance and better wgt shifting to the L than R with better stepping on the R. Pt would benefit from RW.       Balance Overall balance assessment: Mild deficits observed, not  formally tested         Pertinent Vitals/Pain Pain Assessment Pain Assessment: Faces Faces Pain Scale: Hurts a little bit Breathing: normal Negative Vocalization: none Facial Expression: smiling or inexpressive Body Language: relaxed Consolability: no need to console PAINAD Score: 0 Pain Location: head Pain Descriptors / Indicators: Aching, Headache, Grimacing Pain  Intervention(s): Limited activity within patient's tolerance, Monitored during session, Patient requesting pain meds-RN notified    Home Living Family/patient expects to be discharged to:: Private residence Living Arrangements: Spouse/significant other Available Help at Discharge: Family;Available 24 hours/day Type of Home: House Home Access: Stairs to enter Entrance Stairs-Rails: Right;Can reach both;Left Entrance Stairs-Number of Steps: 1   Home Layout: One level Home Equipment: Agricultural consultant (2 wheels);Rollator (4 wheels);Shower seat      Prior Function Prior Level of Function : Independent/Modified Independent;Driving         Extremity/Trunk Assessment   Upper Extremity Assessment Upper Extremity Assessment: Defer to OT evaluation    Lower Extremity Assessment Lower Extremity Assessment: Generalized weakness    Cervical / Trunk Assessment Cervical / Trunk Assessment: Normal  Communication   Communication Communication: Difficulty communicating thoughts/reduced clarity of speech (Sounded as though pt had alot of muscus or saliva in his mouth although pt denied need to spit)  Cognition Arousal: Lethargic Behavior During Therapy: Flat affect Overall Cognitive Status: No family/caregiver present to determine baseline cognitive functioning       General Comments: Slow to respond to questions, Pt kept eyes closed during evaluation. Answered all questions appropriately. Information regarding entrance to home was not very clear.        General Comments General comments (skin integrity, edema, etc.): No noted respiratory or cardiac distress throughout session.        Assessment/Plan    PT Assessment Patient needs continued PT services  PT Problem List Decreased strength;Decreased balance;Decreased mobility;Decreased activity tolerance;Decreased cognition;Decreased safety awareness       PT Treatment Interventions DME instruction;Therapeutic activities;Gait  training;Therapeutic exercise;Patient/family education;Stair training;Balance training;Functional mobility training    PT Goals (Current goals can be found in the Care Plan section)  Acute Rehab PT Goals Patient Stated Goal: Pt was not clear on goals. PT Goal Formulation: Patient unable to participate in goal setting Time For Goal Achievement: 09/24/23 Potential to Achieve Goals: Fair    Frequency Min 1X/week     Co-evaluation PT/OT/SLP Co-Evaluation/Treatment: Yes Reason for Co-Treatment: To address functional/ADL transfers PT goals addressed during session: Mobility/safety with mobility;Balance OT goals addressed during session: ADL's and self-care;Strengthening/ROM       AM-PAC PT "6 Clicks" Mobility  Outcome Measure Help needed turning from your back to your side while in a flat bed without using bedrails?: A Little Help needed moving from lying on your back to sitting on the side of a flat bed without using bedrails?: A Little Help needed moving to and from a bed to a chair (including a wheelchair)?: A Little Help needed standing up from a chair using your arms (e.g., wheelchair or bedside chair)?: A Little Help needed to walk in hospital room?: A Little Help needed climbing 3-5 steps with a railing? : A Lot 6 Click Score: 17    End of Session   Activity Tolerance: Patient tolerated treatment well;Patient limited by fatigue Patient left: in bed;with call bell/phone within reach;with bed alarm set Nurse Communication: Mobility status PT Visit Diagnosis: Unsteadiness on feet (R26.81);Other abnormalities of gait and mobility (R26.89)    Time: 5784-6962 PT Time Calculation (min) (ACUTE ONLY): 17  min   Charges:   PT Evaluation $PT Eval Low Complexity: 1 Low   PT General Charges $$ ACUTE PT VISIT: 1 Visit         Harrel Carina, DPT, CLT  Acute Rehabilitation Services Office: [phone] (Secure chat preferred)   Claudia Desanctis 09/10/2023, 4:09 PM

## 2023-09-10 NOTE — Progress Notes (Signed)
OT Cancellation Note  Patient Details Name: [name] MRN: [id number] DOB: 11/13/1950   Cancelled Treatment:    Reason Eval/Treat Not Completed: Patient at procedure or test/ unavailable Pt currently out of room at CT. Will follow up with patient when able.   Limmie Patricia, OTR/L,CBIS  Supplemental OT - [facility] and [facility] Secure Chat Preferred   09/10/2023, 2:18 PM

## 2023-09-10 NOTE — Progress Notes (Signed)
Nephrology Follow-Up Consult note   Assessment/Recommendations: [name] is a/an 73 y.o. male with a past medical history significant for ESRD, admitted for seizures.     Outpatient HD orders:  [name]  MWF  4 hours Noncompliant - skips HD every Wednesday against medical advice  400 BF 800 DF EDW 73.3 kg  Last tx 1/27 - left at 74 kg - treatment ended prematurely as above  AV graft 08/28/23 with post weight of 73.2 kg.  2 K/2 Calcium Meds: calcitriol 4 mcg three times a week; sensipar 30 mg three times a week with HD; venofer 100 mg IV with HD x 10; mircera 100 mcg every 2 weeks (last given 08/19/23; due today however didn't get outpatient per charting)   # Seizure  - Appreciate neurology - anti-epileptics per neurology   # Altered mental status  - Post-ictal as well as s/p PRN and with some degree of uremia. Overall improved - Optimize seizure treatment as above    # ESRD - Cont dialysis per schedule   # HTN  -BP somewhat elevated. Started losartan 50mg  daily. UF w/ HD   # Anemia of CKD  - Hgb at goal   # Secondary hyperparathyroidism, renal  - Cont binders, calcitriol, and sensipar    Recommendations conveyed to primary service.    [name] [facility] 09/10/2023 9:23 AM  ___________________________________________________________  CC: Seizure  Interval History/Subjective: Patient denies any complaints but continually says "I'm still here." No other issues   Medications:  Current Facility-Administered Medications  Medication Dose Route Frequency Provider Last Rate Last Admin   acetaminophen (TYLENOL) tablet 650 mg  650 mg Oral Q6H PRN [name], MD   650 mg at 09/10/23 0155   Or   acetaminophen (TYLENOL) suppository 650 mg  650 mg Rectal Q6H PRN [name], MD       calcitRIOL (ROCALTROL) capsule 4 mcg  4 mcg Oral Q M,W,F [name], MD   4 mcg at 09/09/23 1157   Chlorhexidine Gluconate Cloth 2 % PADS 6  each  6 each Topical Q0600 [name], MD   6 each at 09/09/23 0542   cinacalcet (SENSIPAR) tablet 30 mg  30 mg Oral Q M,W,F [name], MD   30 mg at 09/09/23 1158   dextrose 10 % infusion   Intravenous Continuous [name], MD 50 mL/hr at 09/09/23 1953 New Bag at 09/09/23 1953   heparin injection 5,000 Units  5,000 Units Subcutaneous Q8H [name], [name], MD   5,000 Units at 09/10/23 0533   hydrALAZINE (APRESOLINE) injection 5 mg  5 mg Intravenous Q4H PRN Howerter, Justin B, DO   5 mg at 09/10/23 0005   insulin aspart (novoLOG) injection 0-15 Units  0-15 Units Subcutaneous Q4H [name], [name], MD   3 Units at 09/09/23 0839   levETIRAcetam (KEPPRA) IVPB 500 mg/100 mL premix  500 mg Intravenous Q24H [name], RPH 400 mL/hr at 09/09/23 1948 500 mg at 09/09/23 1948   And   levETIRAcetam (KEPPRA) 250 mg in sodium chloride 0.9 % 100 mL IVPB  250 mg Intravenous Q M,W,F-2000 [name], RPH 410 mL/hr at 09/09/23 2129 250 mg at 09/09/23 2129   LORazepam (ATIVAN) injection 2 mg  2 mg Intravenous PRN [name], MD       losartan (COZAAR) tablet 50 mg  50 mg Oral Daily [name], MD       ondansetron [facility]) tablet 4 mg  4 mg Oral Q6H PRN [name], MD       Or   ondansetron (ZOFRAN) injection 4 mg  4 mg Intravenous Q6H PRN [name], [name], MD       sevelamer carbonate (RENVELA) tablet 1,600 mg  1,600 mg Oral TID WC [name], MD   1,600 mg at 09/09/23 1945   sodium chloride flush (NS) 0.9 % injection 3 mL  3 mL Intravenous Once [name], MD          Review of Systems: 10 systems reviewed and negative except per interval history/subjective  Physical Exam: Vitals:   09/10/23 0534 09/10/23 0807  BP: (!) 168/82 (!) 176/87  Pulse: 72   Resp: 20   Temp: 98 F (36.7 C) 98.3 F (36.8 C)  SpO2: 98%    No intake/output data recorded.  Intake/Output Summary (Last 24 hours) at 09/10/2023 8295 Last data filed at 09/09/2023 2129 Gross per 24  hour  Intake 240 ml  Output 2000 ml  Net -1760 ml   Constitutional: well-appearing, no acute distress ENMT: ears and nose without scars or lesions, MMM CV: normal rate, no edema Respiratory: bilateral chest rise, normal work of breathing Gastrointestinal: soft, non-tender, no palpable masses or hernias Skin: no visible lesions or rashes Psych: alert, mood normal, blunted affect   Test Results I personally reviewed new and old clinical labs and radiology tests Lab Results  Component Value Date   NA 131 (L) 09/10/2023   K 4.1 09/10/2023   CL 93 (L) 09/10/2023   CO2 24 09/10/2023   BUN 42 (H) 09/10/2023   CREATININE 8.12 (H) 09/10/2023   GFR 44.63 (L) 01/18/2018   GLU 102 03/29/2020   CALCIUM 8.6 (L) 09/10/2023   ALBUMIN 2.7 (L) 09/09/2023   PHOS 5.8 (H) 09/10/2023    CBC Recent Labs  Lab 09/07/23 1641 09/07/23 1705 09/08/23 0247 09/09/23 0236 09/10/23 0228  WBC 9.3  --  8.3 6.8 7.9  NEUTROABS 7.9*  --  6.8  --   --   HGB 12.8*   < > 11.6* 12.1* 11.9*  HCT 39.7   < > 36.8* 38.3* 37.8*  MCV 80.9  --  82.5 83.6 82.2  PLT 93*  --  82* 74* 77*   < > = values in this interval not displayed.

## 2023-09-10 NOTE — Evaluation (Signed)
Occupational Therapy Evaluation Patient Details Name: [name] MRN: [id number] DOB: [birth year] Today's Date: 09/10/2023   History of Present Illness 73 year old male with PMH significant for ESRD on HD MWF, HTN, PAD, HLD.  Patient has missed his last 11 days of hemodialysis per wife because patient was too weak "to go out.  Last week he had 2 episodes of nausea and vomiting.  His appetite has been decreased.  Over the past week gotten progressively weaker, did complain of a headache, resolved with Tylenol.  No reports of fever or chills.  On arrival to hemodialysis patient noted to be weak, He was taken via wheelchair.  2-1/2 hours into hemodialysis he was noted to be aphasic and having abnormal movements in the right upper extremity.  He was sent to the ER as a code stroke.  In ER patient had seizures, He was given IM Versed and loaded with IV Keppra 1000 mg.  Had episode of hypoxemia , SPO2 70s, and hypotension systolic blood pressures to the 60s.  He was transiently placed on a nonrebreather.  Given 1 L IV fluid bolus.  BP responsive to fluids.  Since then patient has been poorly responsive with sonorus breathing.  Stat CT head done and negative.  Continuous EEG reviewed by Neurology, No evidence of seizures.   Clinical Impression   Pt admitted with the above diagnosis. Pt currently with functional limitations due to the deficits listed below (see OT Problem List). Prior to admit, pt reports that he lived at home with his wife and was independent with all ADL tasks and functional mobility. Pt will benefit from acute skilled OT to increase their safety and independence with ADL and functional mobility for ADL to facilitate discharge. Recommend follow Home health OT services to focus on mentioned deficits and allow pt to return to highest level of independence with all daily tasks. OT will continue to follow pt acutely.         If plan is discharge home, recommend the following: A little help  with walking and/or transfers;A little help with bathing/dressing/bathroom;Help with stairs or ramp for entrance;Assist for transportation;Assistance with cooking/housework    Functional Status Assessment  Patient has had a recent decline in their functional status and demonstrates the ability to make significant improvements in function in a reasonable and predictable amount of time.  Equipment Recommendations  None recommended by OT       Precautions / Restrictions Precautions Precautions: Fall;Other (comment) Precaution Comments: seizure Restrictions Weight Bearing Restrictions Per Provider Order: No      Mobility Bed Mobility Overal bed mobility: Needs Assistance Bed Mobility: Supine to Sit, Sit to Supine     Supine to sit: Supervision, HOB elevated Sit to supine: Supervision, HOB elevated   General bed mobility comments: VC to initiate task. Increased time to initiate.    Transfers Overall transfer level: Needs assistance Equipment used: 2 person hand held assist Transfers: Sit to/from Stand, Bed to chair/wheelchair/BSC Sit to Stand: Min assist, +2 physical assistance           General transfer comment: Pt completed sit to stand at EOB then performed lateral side steps towards HOB. VC for safety awareness and technique as needed. Pt did not control his decent back to sitting.      Balance Overall balance assessment: Mild deficits observed, not formally tested      ADL either performed or assessed with clinical judgement   ADL    General ADL Comments: Pt requires CGA-Min A  for BADL tasks at this time seated.     Vision Baseline Vision/History: 1 Wears glasses Ability to See in Adequate Light: 0 Adequate Patient Visual Report: No change from baseline Vision Assessment?: No apparent visual deficits     Perception Perception: Not tested       Praxis Praxis: Not tested       Pertinent Vitals/Pain Pain Assessment Pain Assessment: Faces Pain Location:  head Pain Descriptors / Indicators: Aching, Headache, Grimacing Pain Intervention(s): Limited activity within patient's tolerance, Patient requesting pain meds-RN notified, Other (comment) (Nursing reports that it is too soon for pain medication at this time)     Extremity/Trunk Assessment Upper Extremity Assessment Upper Extremity Assessment: Right hand dominant;Generalized weakness   Lower Extremity Assessment Lower Extremity Assessment: Defer to PT evaluation   Cervical / Trunk Assessment Cervical / Trunk Assessment: Normal   Communication Communication Communication: Difficulty communicating thoughts/reduced clarity of speech (Sounded as though pt had alot of muscus or saliva in his mouth although pt denied need to spit)   Cognition Arousal: Lethargic Behavior During Therapy: Flat affect Overall Cognitive Status: No family/caregiver present to determine baseline cognitive functioning     General Comments: Slow to respond to questions, Pt kept eyes closed during evaluation. Answered all questions appropriately. Information regarding entrance to home was not very clear.     General Comments  VSS on RA            Home Living Family/patient expects to be discharged to:: Private residence Living Arrangements: Spouse/significant other Available Help at Discharge: Family;Available 24 hours/day Type of Home: House Home Access: Stairs to enter [employer] of Steps: 1 Entrance Stairs-Rails: Right;Can reach both;Left Home Layout: One level     Bathroom Shower/Tub: Chief Strategy Officer: Standard     Home Equipment: Agricultural consultant (2 wheels);Rollator (4 wheels);Shower seat          Prior Functioning/Environment Prior Level of Function : Independent/Modified Independent;Driving         OT Problem List: Decreased strength;Decreased coordination;Decreased activity tolerance;Decreased safety awareness;Impaired balance (sitting and/or  standing);Impaired UE functional use      OT Treatment/Interventions: Self-care/ADL training;Therapeutic exercise;Therapeutic activities;Neuromuscular education;DME and/or AE instruction;Patient/family education;Balance training    OT Goals(Current goals can be found in the care plan section) Acute Rehab OT Goals Patient Stated Goal: Requesting medication for cough and headache OT Goal Formulation: With patient Time For Goal Achievement: 09/24/23 Potential to Achieve Goals: Good  OT Frequency: Min 1X/week    Co-evaluation PT/OT/SLP Co-Evaluation/Treatment: Yes Reason for Co-Treatment: To address functional/ADL transfers   OT goals addressed during session: ADL's and self-care;Strengthening/ROM      AM-PAC OT "6 Clicks" Daily Activity     Outcome Measure Help from another person eating meals?: A Little Help from another person taking care of personal grooming?: A Little Help from another person toileting, which includes using toliet, bedpan, or urinal?: A Little Help from another person bathing (including washing, rinsing, drying)?: A Little Help from another person to put on and taking off regular upper body clothing?: A Little Help from another person to put on and taking off regular lower body clothing?: A Little 6 Click Score: 18   End of Session Nurse Communication: Patient requests pain meds  Activity Tolerance: Patient limited by fatigue Patient left: in bed;with call bell/phone within reach;with bed alarm set  OT Visit Diagnosis: Muscle weakness (generalized) (M62.81);Unsteadiness on feet (R26.81)  Time: 8119-1478 OT Time Calculation (min): 15 min Charges:  OT General Charges $OT Visit: 1 Visit OT Evaluation $OT Eval Low Complexity: 1 Low  Limmie Patricia, OTR/L,CBIS  Supplemental OT - [facility] and [facility] Secure Chat Preferred    Garreth Burnsworth, Charisse March 09/10/2023, 3:28 PM

## 2023-09-10 NOTE — Telephone Encounter (Signed)
Pharmacy Patient Advocate Encounter  Received notification from [facility] that Prior Authorization for Valtoco 20 MG Dose 10MG /0.1ML liquid  has been DENIED.  Full denial letter will be uploaded to the media tab. See denial reason below. Medicare allows [place] to cover a drug only when it is a Part D drug. A Part D drug is one that is used for a "medically accepted indication." A medically accepted indication means the use is approved by the Food and Drug Administration (FDA) OR the use is supported by one of the following accepted references: (1) [facility] Formulary Service Drug Information. (2) [employer] DRUGDEX Information System. VALTOCO SPR 20MG  is not FDA approved or supported by an accepted reference for your medical condition(s): Epilepsy.  PA #/Case ID/Reference #: [id number]

## 2023-09-10 NOTE — Progress Notes (Signed)
PROGRESS NOTE    [name]  AOZ:[id number] DOB: Dec 20, 1950 DOA: 09/07/2023 PCP: Etta Grandchild, MD   Brief Narrative: This 73 year old male with PMH significant for ESRD on HD MWF, HTN, PAD, HLD.  Patient has missed his last 11 days of hemodialysis per wife because patient was too weak "to go out.  Last week he had 2 episodes of nausea and vomiting.  His appetite has been decreased.  Over the past week gotten progressively weaker, did complain of a headache, resolved with Tylenol.  No reports of fever or chills.  On arrival to hemodialysis patient noted to be weak, He was taken via wheelchair.  2-1/2 hours into hemodialysis he was noted to be aphasic and having abnormal movements in the right upper extremity.  He was sent to the ER as a code stroke.  In ER patient had seizures, He was given IM Versed and loaded with IV Keppra 1000 mg.  Had episode of hypoxemia , SPO2 70s, and hypotension systolic blood pressures to the 60s.  He was transiently placed on a nonrebreather.  Given 1 L IV fluid bolus.  BP responsive to fluids.  Since then patient has been poorly responsive with sonorus breathing.  Stat CT head done and negative.  Continuous EEG reviewed by Neurology, No evidence of seizures. Labs:  AST 169, ALT 525, AST 178 [new], platelets 93, INR 1.4, CXR pulmonary edema mild.  Patient was admitted for further evaluation, Nephrology and neurology is consulted.   Assessment & Plan:   Principal Problem:   Somnolence Active Problems:   Hypertension   End stage renal disease (HCC)   Dyslipidemia, goal LDL below 100   PAD (peripheral artery disease) (HCC)   (HFpEF) heart failure with preserved ejection fraction (HCC)   T2DM (type 2 diabetes mellitus) (HCC)   Elevated LFTs  Acute metabolic encephalopathy likely multifactorial: Differential diagnosis: Seizures, postictal, CVA, uremia, infection. CT head normal. Patient has not completed hemodialysis,  likely uremia. There is no evidence of  infection. Respiratory panel, hepatitis panel normal. Chest x-ray showed cardiomegaly with pulmonary edema. Patient seems improving. Continue to monitor clinical status. Continue hemodialysis as per schedule. EEG > No evidence of seizures.  Long-term EEG discontinued. MRI brain >  No acute intracranial abnormality found. Neurology recommended MRA Head and  Carotid ultrasound to evaluate for vertebral artery occlusion.  Hyperkalemia:  > Resolved. Likely due to missed hemodialysis.   Continue dialysis as per nephrology  Seizure disorder: Patient was loaded with IV Keppra. Neurology recommended to continue Keppra twice daily. EEG > No evidence of seizures. LTM EEG >  no evidence of seizures. Neurology recommended MRI brain if negative , Neurology will sign off. Neuro recommended Valtoco ( rescue nasal spray). If not covered can prescribe clonazepam 2mg  ODT instead.  ESRD on hemodialysis: Nephrology on board, hemodialysis per MWF as scheduled.  Essential hypertension: Blood pressure is controlled.   Continue Lopressor as needed  Hyperlipidemia: LDL below 100.  Statin on hold.  Elevated  LFTs: Abdominal ultrasound normal.  Ammonia level normal,  Continue to monitor LFT.  Anemia of CKD: Hemoglobin at baseline.:  Thrombocytopenia: No signs of any bleeding.  Continue to monitor platelet count.   DVT prophylaxis: Heparin Code Status: Full code Family Communication: Wife at bedside. Disposition Plan:    Status is: Inpatient Remains inpatient appropriate because: Severity of illness.   Consultants:  Neurology Nephrology  Procedures: Hemodialysis  Antimicrobials:  Anti-infectives (From admission, onward)    None  Subjective: Patient was seen and examined at bedside.  Overnight events noted.  Patient reports feeling better, and is able to move all 4 extremities.  Objective: Vitals:   09/09/23 1931 09/09/23 2312 09/10/23 0534 09/10/23 0807  BP: (!) 183/95 (!)  167/87 (!) 168/82 (!) 176/87  Pulse: 74 73 72   Resp: 20 19 20    Temp: 97.6 F (36.4 C) 98.4 F (36.9 C) 98 F (36.7 C) 98.3 F (36.8 C)  TempSrc: Oral Oral Oral Oral  SpO2: 98% 97% 98%   Weight:   72.9 kg   Height:        Intake/Output Summary (Last 24 hours) at 09/10/2023 1111 Last data filed at 09/09/2023 2129 Gross per 24 hour  Intake 240 ml  Output 2000 ml  Net -1760 ml   Filed Weights   09/09/23 1455 09/09/23 1802 09/10/23 0534  Weight: 74.8 kg 72.8 kg 72.9 kg    Examination:  General exam: Appears comfortable, deconditioned, not in any acute distress. Respiratory system: CTA bilaterally. Respiratory effort normal.  RR 16 Cardiovascular system: S1 & S2 heard, RRR. No JVD, murmurs, rubs, gallops or clicks.  Gastrointestinal system: Abdomen is non distended, soft and nontender. Normal bowel sounds heard. Central nervous system: Alert and oriented X 3. No focal neurological deficits. Extremities: No edema, no cyanosis, no clubbing. Skin: No rashes, lesions or ulcers Psychiatry: Judgement and insight appear normal. Mood & affect appropriate.     Data Reviewed: I have personally reviewed following labs and imaging studies  CBC: Recent Labs  Lab 09/07/23 1641 09/07/23 1705 09/07/23 2359 09/08/23 0247 09/09/23 0236 09/10/23 0228  WBC 9.3  --   --  8.3 6.8 7.9  NEUTROABS 7.9*  --   --  6.8  --   --   HGB 12.8* 14.3 12.9* 11.6* 12.1* 11.9*  HCT 39.7 42.0 38.0* 36.8* 38.3* 37.8*  MCV 80.9  --   --  82.5 83.6 82.2  PLT 93*  --   --  82* 74* 77*   Basic Metabolic Panel: Recent Labs  Lab 09/07/23 1619 09/07/23 1641 09/07/23 1705 09/07/23 2359 09/08/23 0247 09/09/23 0236 09/10/23 0228  NA 135 137 136 135 137 134* 131*  K 4.7 4.5 4.3 5.2* 5.2* 4.4 4.1  CL 101 92*  --   --  92* 93* 93*  CO2  --  22  --   --  25 25 24   GLUCOSE 100* 100*  --   --  83 108* 106*  BUN 94* 100*  --   --  103* 57* 42*  CREATININE 15.00* 14.03*  --   --  15.49* 9.99* 8.12*   CALCIUM  --  8.7*  --   --  8.7* 8.6* 8.6*  MG  --  2.8*  --   --  2.8* 2.4 2.2  PHOS  --   --   --   --   --  6.2* 5.8*   GFR: Estimated Creatinine Clearance: 8.7 mL/min (A) (by C-G formula based on SCr of 8.12 mg/dL (H)). Liver Function Tests: Recent Labs  Lab 09/07/23 1641 09/08/23 0247 09/09/23 0236  AST 169* 127* 124*  ALT 527* 446* 359*  ALKPHOS 178* 161* 148*  BILITOT 1.1 0.9 1.0  PROT 6.5 5.8* 5.7*  ALBUMIN 3.3* 2.8* 2.7*   Recent Labs  Lab 09/07/23 1641  LIPASE 33   Recent Labs  Lab 09/07/23 2340  AMMONIA 24   Coagulation Profile: Recent Labs  Lab 09/07/23 1641  INR  1.4*   Cardiac Enzymes: No results for input(s): "CKTOTAL", "CKMB", "CKMBINDEX", "TROPONINI" in the last 168 hours. BNP (last 3 results) No results for input(s): "PROBNP" in the last 8760 hours. HbA1C: Recent Labs    09/07/23 2134  HGBA1C 4.9   CBG: Recent Labs  Lab 09/09/23 1150 09/09/23 1929 09/09/23 2311 09/10/23 0203 09/10/23 0809  GLUCAP 102* 104* 141* 111* 109*   Lipid Profile: No results for input(s): "CHOL", "HDL", "LDLCALC", "TRIG", "CHOLHDL", "LDLDIRECT" in the last 72 hours. Thyroid Function Tests: Recent Labs    09/07/23 1641  TSH 2.102   Anemia Panel: No results for input(s): "VITAMINB12", "FOLATE", "FERRITIN", "TIBC", "IRON", "RETICCTPCT" in the last 72 hours. Sepsis Labs: Recent Labs  Lab 09/07/23 1707 09/08/23 0247 09/08/23 0531  LATICACIDVEN 2.4* 1.6 1.7    Recent Results (from the past 240 hours)  Resp panel by RT-PCR (RSV, Flu A&B, Covid) Anterior Nasal Swab     Status: None   Collection Time: 09/07/23  9:34 PM   Specimen: Anterior Nasal Swab  Result Value Ref Range Status   SARS Coronavirus 2 by RT PCR NEGATIVE NEGATIVE Final   Influenza A by PCR NEGATIVE NEGATIVE Final   Influenza B by PCR NEGATIVE NEGATIVE Final    Comment: (NOTE) The Xpert Xpress SARS-CoV-2/FLU/RSV plus assay is intended as an aid in the diagnosis of influenza from  Nasopharyngeal swab specimens and should not be used as a sole basis for treatment. Nasal washings and aspirates are unacceptable for Xpert Xpress SARS-CoV-2/FLU/RSV testing.  Fact Sheet for Patients: BloggerCourse.com  Fact Sheet for Healthcare Providers: SeriousBroker.it  This test is not yet approved or cleared by the [place] FDA and has been authorized for detection and/or diagnosis of SARS-CoV-2 by FDA under an Emergency Use Authorization (EUA). This EUA will remain in effect (meaning this test can be used) for the duration of the COVID-19 declaration under Section 564(b)(1) of the Act, 21 U.S.C. section 360bbb-3(b)(1), unless the authorization is terminated or revoked.     Resp Syncytial Virus by PCR NEGATIVE NEGATIVE Final    Comment: (NOTE) Fact Sheet for Patients: BloggerCourse.com  Fact Sheet for Healthcare Providers: SeriousBroker.it  This test is not yet approved or cleared by the [place] FDA and has been authorized for detection and/or diagnosis of SARS-CoV-2 by FDA under an Emergency Use Authorization (EUA). This EUA will remain in effect (meaning this test can be used) for the duration of the COVID-19 declaration under Section 564(b)(1) of the Act, 21 U.S.C. section 360bbb-3(b)(1), unless the authorization is terminated or revoked.  Performed at [facility], [address], [place], [place] [address]   MRSA Next Gen by PCR, Nasal     Status: None   Collection Time: 09/08/23  2:15 PM   Specimen: Nasal Mucosa; Nasal Swab  Result Value Ref Range Status   MRSA by PCR Next Gen NOT DETECTED NOT DETECTED Final    Comment: (NOTE) The GeneXpert MRSA Assay (FDA approved for NASAL specimens only), is one component of a comprehensive MRSA colonization surveillance program. It is not intended to diagnose MRSA infection nor to guide or monitor treatment  for MRSA infections. Test performance is not FDA approved in patients less than 28 years old. Performed at [facility], [address], [place], [place] [address]    Radiology Studies: MR BRAIN WO CONTRAST Result Date: 09/09/2023 CLINICAL DATA:  Seizure, new onset, no history of trauma EXAM: MRI HEAD WITHOUT CONTRAST TECHNIQUE: Multiplanar, multiecho pulse sequences  of the brain and surrounding structures were obtained without intravenous contrast. COMPARISON:  None Available. FINDINGS: Evaluation is somewhat limited by motion artifact. Brain: The hippocampi are roughly symmetric in size and signal. No heterotopia or evidence of cortical dysgenesis. No restricted diffusion to suggest acute or subacute infarct. No acute hemorrhage, mass, mass effect, or midline shift. No hydrocephalus or extra-axial collection. Pituitary and craniocervical junction within normal limits. No hemosiderin deposition to suggest remote hemorrhage. Mildly advanced cerebral atrophy for age, with prominence of the ventricles and sulci. Confluent T2 hyperintense signal in the periventricular white matter, likely the sequela of moderate chronic small vessel ischemic disease. Vascular: Loss of the right vertebral artery flow void (series 5, image 6). Otherwise normal arterial flow voids. Skull and upper cervical spine: Normal marrow signal. Sinuses/Orbits: Mucous retention cysts in the left maxillary sinus. No acute finding in the orbits. Other: The mastoid air cells are well aerated. IMPRESSION: 1. No acute intracranial process. No seizure focus is identified. 2. Loss of the right vertebral artery flow void, which is nonspecific but can be seen in the setting of slow flow or occlusion. If there is clinical concern for vertebral artery pathology, consider further evaluation with CTA. Electronically Signed   By: Wiliam Ke M.D.   On: 09/09/2023 16:19   Scheduled Meds:  calcitRIOL  4 mcg Oral Q M,W,F   Chlorhexidine Gluconate  Cloth  6 each Topical Q0600   cinacalcet  30 mg Oral Q M,W,F   heparin  5,000 Units Subcutaneous Q8H   insulin aspart  0-15 Units Subcutaneous Q4H   losartan  50 mg Oral Daily   sevelamer carbonate  1,600 mg Oral TID WC   sodium chloride flush  3 mL Intravenous Once   Continuous Infusions:  dextrose 50 mL/hr at 09/09/23 1953   levETIRAcetam 500 mg (09/09/23 1948)   And   levETIRAcetam 250 mg (09/09/23 2129)     LOS: 3 days    Time spent: 35 mins    [name], MD [facility]   If 7PM-7AM, please contact night-coverage

## 2023-09-10 NOTE — Plan of Care (Signed)
  Problem: Coping: Goal: Ability to adjust to condition or change in health will improve Outcome: Progressing   Problem: Metabolic: Goal: Ability to maintain appropriate glucose levels will improve Outcome: Progressing   Problem: Tissue Perfusion: Goal: Adequacy of tissue perfusion will improve Outcome: Progressing   Problem: Education: Goal: Knowledge of General Education information will improve Description: Including pain rating scale, medication(s)/side effects and non-pharmacologic comfort measures Outcome: Progressing   Problem: Clinical Measurements: Goal: Diagnostic test results will improve Outcome: Progressing Goal: Respiratory complications will improve Outcome: Progressing Goal: Cardiovascular complication will be avoided Outcome: Progressing   Problem: Pain Managment: Goal: General experience of comfort will improve and/or be controlled Outcome: Progressing

## 2023-09-11 ENCOUNTER — Inpatient Hospital Stay (HOSPITAL_COMMUNITY): Payer: Medicare Other

## 2023-09-11 DIAGNOSIS — I6509 Occlusion and stenosis of unspecified vertebral artery: Secondary | ICD-10-CM | POA: Diagnosis not present

## 2023-09-11 DIAGNOSIS — R400 Somnolence: Secondary | ICD-10-CM | POA: Diagnosis not present

## 2023-09-11 LAB — BASIC METABOLIC PANEL
Anion gap: 12 (ref 5–15)
BUN: 33 mg/dL — ABNORMAL HIGH (ref 8–23)
CO2: 26 mmol/L (ref 22–32)
Calcium: 8.3 mg/dL — ABNORMAL LOW (ref 8.9–10.3)
Chloride: 94 mmol/L — ABNORMAL LOW (ref 98–111)
Creatinine, Ser: 7.45 mg/dL — ABNORMAL HIGH (ref 0.61–1.24)
GFR, Estimated: 7 mL/min — ABNORMAL LOW (ref >60)
Glucose, Bld: 103 mg/dL — ABNORMAL HIGH (ref 70–99)
Potassium: 4.9 mmol/L (ref 3.5–5.1)
Sodium: 132 mmol/L — ABNORMAL LOW (ref 135–145)

## 2023-09-11 LAB — CBC
HCT: 35.4 % — ABNORMAL LOW (ref 39.0–52.0)
Hemoglobin: 11 g/dL — ABNORMAL LOW (ref 13.0–17.0)
MCH: 25.8 pg — ABNORMAL LOW (ref 26.0–34.0)
MCHC: 31.1 g/dL (ref 30.0–36.0)
MCV: 82.9 fL (ref 80.0–100.0)
Platelets: 63 10*3/uL — ABNORMAL LOW (ref 150–400)
RBC: 4.27 MIL/uL (ref 4.22–5.81)
RDW: 19.5 % — ABNORMAL HIGH (ref 11.5–15.5)
WBC: 4.9 10*3/uL (ref 4.0–10.5)
nRBC: 0 % (ref 0.0–0.2)

## 2023-09-11 LAB — BLOOD GAS, ARTERIAL
Acid-Base Excess: 8.6 mmol/L — ABNORMAL HIGH (ref 0.0–2.0)
Bicarbonate: 33.5 mmol/L — ABNORMAL HIGH (ref 20.0–28.0)
Drawn by: 270221
O2 Saturation: 98.9 %
Patient temperature: 37.2
pCO2 arterial: 46 mm[Hg] (ref 32–48)
pH, Arterial: 7.47 — ABNORMAL HIGH (ref 7.35–7.45)
pO2, Arterial: 86 mm[Hg] (ref 83–108)

## 2023-09-11 LAB — GLUCOSE, CAPILLARY
Glucose-Capillary: 100 mg/dL — ABNORMAL HIGH (ref 70–99)
Glucose-Capillary: 113 mg/dL — ABNORMAL HIGH (ref 70–99)
Glucose-Capillary: 115 mg/dL — ABNORMAL HIGH (ref 70–99)
Glucose-Capillary: 124 mg/dL — ABNORMAL HIGH (ref 70–99)
Glucose-Capillary: 90 mg/dL (ref 70–99)
Glucose-Capillary: 96 mg/dL (ref 70–99)

## 2023-09-11 LAB — PROCALCITONIN: Procalcitonin: 5.42 ng/mL

## 2023-09-11 MED ORDER — HYDRALAZINE HCL 20 MG/ML IJ SOLN
10.0000 mg | INTRAMUSCULAR | Status: DC | PRN
  Administered 2023-09-11 – 2023-09-12 (×3): 10 mg via INTRAVENOUS
  Filled 2023-09-11 (×3): qty 1

## 2023-09-11 MED ORDER — ASPIRIN 81 MG PO CHEW
81.0000 mg | CHEWABLE_TABLET | Freq: Every day | ORAL | Status: DC
Start: 2023-09-11 — End: 2023-09-13
  Administered 2023-09-11 – 2023-09-13 (×2): 81 mg via ORAL
  Filled 2023-09-11 (×3): qty 1

## 2023-09-11 MED ORDER — HYDRALAZINE HCL 20 MG/ML IJ SOLN
10.0000 mg | INTRAMUSCULAR | Status: AC
  Administered 2023-09-11: 10 mg via INTRAVENOUS
  Filled 2023-09-11: qty 1

## 2023-09-11 MED ORDER — HYDRALAZINE HCL 25 MG PO TABS
25.0000 mg | ORAL_TABLET | Freq: Three times a day (TID) | ORAL | Status: DC
  Administered 2023-09-11 – 2023-09-13 (×4): 25 mg via ORAL
  Filled 2023-09-11 (×5): qty 1

## 2023-09-11 MED ORDER — LOSARTAN POTASSIUM 50 MG PO TABS
50.0000 mg | ORAL_TABLET | Freq: Every day | ORAL | Status: DC
  Administered 2023-09-11: 50 mg via ORAL
  Filled 2023-09-11: qty 1

## 2023-09-11 NOTE — Progress Notes (Signed)
PROGRESS NOTE    [name]  ZOX:[id number] DOB: 1951-08-02 DOA: 09/07/2023 PCP: Etta Grandchild, MD   Brief Narrative: This 73 year old male with PMH significant for ESRD on HD MWF, HTN, PAD, HLD.  Patient has missed his last 11 days of hemodialysis per wife because patient was too weak "to go out.  Last week he had 2 episodes of nausea and vomiting.  His appetite has been decreased.  Over the past week gotten progressively weaker, did complain of a headache, resolved with Tylenol.  No reports of fever or chills.  On arrival to hemodialysis patient noted to be weak, He was taken via wheelchair.  2-1/2 hours into hemodialysis he was noted to be aphasic and having abnormal movements in the right upper extremity.  He was sent to the ER as a code stroke.  In ER patient had seizures, He was given IM Versed and loaded with IV Keppra 1000 mg.  Had episode of hypoxemia , SPO2 70s, and hypotension systolic blood pressures to the 60s.  He was transiently placed on a nonrebreather.  Given 1 L IV fluid bolus.  BP responsive to fluids.  Since then patient has been poorly responsive with sonorus breathing.  Stat CT head done and negative.  Continuous EEG reviewed by Neurology, No evidence of seizures. Labs:  AST 169, ALT 525, AST 178 [new], platelets 93, INR 1.4, CXR pulmonary edema mild.  Patient was admitted for further evaluation, Nephrology and neurology is consulted.   Assessment & Plan:   Principal Problem:   Somnolence Active Problems:   Hypertension   End stage renal disease (HCC)   Dyslipidemia, goal LDL below 100   PAD (peripheral artery disease) (HCC)   (HFpEF) heart failure with preserved ejection fraction (HCC)   T2DM (type 2 diabetes mellitus) (HCC)   Elevated LFTs  Acute metabolic encephalopathy likely multifactorial: Differential diagnosis: Seizures, postictal, CVA, uremia, infection. CT head normal. Patient has not completed hemodialysis,  likely uremia. There is no evidence of  infection. Respiratory panel, hepatitis panel normal. Chest x-ray showed cardiomegaly with pulmonary edema. Patient seems improving. Continue to monitor clinical status. Continue hemodialysis as per schedule. EEG > No evidence of seizures.  Long-term EEG discontinued. MRI brain >  No acute intracranial abnormality found. Neurology recommended MRA Head and Carotid ultrasound to evaluate for vertebral artery occlusion. MRA completed. Neurology adding asa 81mg  daily. No other workup from neurology standpoint.  Neurology signed off.  Hyperkalemia:  > Resolved. Likely due to missed hemodialysis.   Continue dialysis as per nephrology  Seizure disorder: Patient was loaded with IV Keppra. Neurology recommended to continue Keppra twice daily. EEG > No evidence of seizures. LTM EEG >  no evidence of seizures. Neurology recommended MRI brain if negative , Neurology will sign off. Neuro recommended Valtoco ( rescue nasal spray). If not covered can prescribe clonazepam 2mg  ODT instead.  ESRD on hemodialysis: Nephrology on board, hemodialysis per MWF as scheduled.  Essential hypertension: Blood pressure is controlled.   Continue Lopressor as needed  Hyperlipidemia: LDL below 100.  Statin on hold.  Elevated  LFTs: Abdominal ultrasound normal.  Ammonia level normal,  Continue to monitor LFT.  Anemia of CKD: Hemoglobin at baseline.  Thrombocytopenia: No signs of any bleeding.  Continue to monitor platelet count.   DVT prophylaxis: Heparin Code Status: Full code Family Communication: Wife at bedside. Disposition Plan:    Status is: Inpatient Remains inpatient appropriate because: Severity of illness.   Consultants:  Neurology Nephrology  Procedures:  Hemodialysis  Antimicrobials:  Anti-infectives (From admission, onward)    None      Subjective: Patient was seen and examined at bedside.  Overnight events noted.  Patient was seen in the hemodialysis , Patient seems more  groggy, states he could not sleep last night.  Objective: Vitals:   09/11/23 1130 09/11/23 1145 09/11/23 1210 09/11/23 1215  BP: (!) 179/87 (!) 183/89 (!) 158/86   Pulse:      Resp:      Temp:   99 F (37.2 C)   TempSrc:   Oral   SpO2:      Weight:    70.5 kg  Height:        Intake/Output Summary (Last 24 hours) at 09/11/2023 1342 Last data filed at 09/11/2023 1210 Gross per 24 hour  Intake 648.93 ml  Output 3400 ml  Net -2751.07 ml   Filed Weights   09/10/23 0534 09/11/23 0817 09/11/23 1215  Weight: 72.9 kg 74 kg 70.5 kg    Examination:  General exam: Appears comfortable, deconditioned, not in any acute distress. Respiratory system: CTA bilaterally. Respiratory effort normal.  RR 16 Cardiovascular system: S1 & S2 heard, RRR. No JVD, murmurs, rubs, gallops or clicks.  Gastrointestinal system: Abdomen is non distended, soft and nontender. Normal bowel sounds heard. Central nervous system: Groggy, following commands, arousable,  no focal neurological deficits. Extremities: No edema, no cyanosis, no clubbing. Skin: No rashes, lesions or ulcers Psychiatry: Judgement and insight appear normal. Mood & affect appropriate.     Data Reviewed: I have personally reviewed following labs and imaging studies  CBC: Recent Labs  Lab 09/07/23 1641 09/07/23 1705 09/07/23 2359 09/08/23 0247 09/09/23 0236 09/10/23 0228  WBC 9.3  --   --  8.3 6.8 7.9  NEUTROABS 7.9*  --   --  6.8  --   --   HGB 12.8* 14.3 12.9* 11.6* 12.1* 11.9*  HCT 39.7 42.0 38.0* 36.8* 38.3* 37.8*  MCV 80.9  --   --  82.5 83.6 82.2  PLT 93*  --   --  82* 74* 77*   Basic Metabolic Panel: Recent Labs  Lab 09/07/23 1619 09/07/23 1641 09/07/23 1705 09/07/23 2359 09/08/23 0247 09/09/23 0236 09/10/23 0228  NA 135 137 136 135 137 134* 131*  K 4.7 4.5 4.3 5.2* 5.2* 4.4 4.1  CL 101 92*  --   --  92* 93* 93*  CO2  --  22  --   --  25 25 24   GLUCOSE 100* 100*  --   --  83 108* 106*  BUN 94* 100*  --   --   103* 57* 42*  CREATININE 15.00* 14.03*  --   --  15.49* 9.99* 8.12*  CALCIUM  --  8.7*  --   --  8.7* 8.6* 8.6*  MG  --  2.8*  --   --  2.8* 2.4 2.2  PHOS  --   --   --   --   --  6.2* 5.8*   GFR: Estimated Creatinine Clearance: 8.4 mL/min (A) (by C-G formula based on SCr of 8.12 mg/dL (H)). Liver Function Tests: Recent Labs  Lab 09/07/23 1641 09/08/23 0247 09/09/23 0236  AST 169* 127* 124*  ALT 527* 446* 359*  ALKPHOS 178* 161* 148*  BILITOT 1.1 0.9 1.0  PROT 6.5 5.8* 5.7*  ALBUMIN 3.3* 2.8* 2.7*   Recent Labs  Lab 09/07/23 1641  LIPASE 33   Recent Labs  Lab 09/07/23 2340  AMMONIA  24   Coagulation Profile: Recent Labs  Lab 09/07/23 1641  INR 1.4*   Cardiac Enzymes: No results for input(s): "CKTOTAL", "CKMB", "CKMBINDEX", "TROPONINI" in the last 168 hours. BNP (last 3 results) No results for input(s): "PROBNP" in the last 8760 hours. HbA1C: No results for input(s): "HGBA1C" in the last 72 hours.  CBG: Recent Labs  Lab 09/10/23 2016 09/11/23 0006 09/11/23 0506 09/11/23 0725 09/11/23 1246  GLUCAP 110* 115* 100* 90 96   Lipid Profile: No results for input(s): "CHOL", "HDL", "LDLCALC", "TRIG", "CHOLHDL", "LDLDIRECT" in the last 72 hours. Thyroid Function Tests: No results for input(s): "TSH", "T4TOTAL", "FREET4", "T3FREE", "THYROIDAB" in the last 72 hours.  Anemia Panel: No results for input(s): "VITAMINB12", "FOLATE", "FERRITIN", "TIBC", "IRON", "RETICCTPCT" in the last 72 hours. Sepsis Labs: Recent Labs  Lab 09/07/23 1707 09/08/23 0247 09/08/23 0531  LATICACIDVEN 2.4* 1.6 1.7    Recent Results (from the past 240 hours)  Resp panel by RT-PCR (RSV, Flu A&B, Covid) Anterior Nasal Swab     Status: None   Collection Time: 09/07/23  9:34 PM   Specimen: Anterior Nasal Swab  Result Value Ref Range Status   SARS Coronavirus 2 by RT PCR NEGATIVE NEGATIVE Final   Influenza A by PCR NEGATIVE NEGATIVE Final   Influenza B by PCR NEGATIVE NEGATIVE Final     Comment: (NOTE) The Xpert Xpress SARS-CoV-2/FLU/RSV plus assay is intended as an aid in the diagnosis of influenza from Nasopharyngeal swab specimens and should not be used as a sole basis for treatment. Nasal washings and aspirates are unacceptable for Xpert Xpress SARS-CoV-2/FLU/RSV testing.  Fact Sheet for Patients: BloggerCourse.com  Fact Sheet for Healthcare Providers: SeriousBroker.it  This test is not yet approved or cleared by the [place] FDA and has been authorized for detection and/or diagnosis of SARS-CoV-2 by FDA under an Emergency Use Authorization (EUA). This EUA will remain in effect (meaning this test can be used) for the duration of the COVID-19 declaration under Section 564(b)(1) of the Act, 21 U.S.C. section 360bbb-3(b)(1), unless the authorization is terminated or revoked.     Resp Syncytial Virus by PCR NEGATIVE NEGATIVE Final    Comment: (NOTE) Fact Sheet for Patients: BloggerCourse.com  Fact Sheet for Healthcare Providers: SeriousBroker.it  This test is not yet approved or cleared by the [place] FDA and has been authorized for detection and/or diagnosis of SARS-CoV-2 by FDA under an Emergency Use Authorization (EUA). This EUA will remain in effect (meaning this test can be used) for the duration of the COVID-19 declaration under Section 564(b)(1) of the Act, 21 U.S.C. section 360bbb-3(b)(1), unless the authorization is terminated or revoked.  Performed at [facility], [address], [place], [place] [address]   MRSA Next Gen by PCR, Nasal     Status: None   Collection Time: 09/08/23  2:15 PM   Specimen: Nasal Mucosa; Nasal Swab  Result Value Ref Range Status   MRSA by PCR Next Gen NOT DETECTED NOT DETECTED Final    Comment: (NOTE) The GeneXpert MRSA Assay (FDA approved for NASAL specimens only), is one component of a comprehensive  MRSA colonization surveillance program. It is not intended to diagnose MRSA infection nor to guide or monitor treatment for MRSA infections. Test performance is not FDA approved in patients less than 56 years old. Performed at [facility], [address], [place], [place] [address]    Radiology Studies: MR ANGIO HEAD WO CONTRAST Result Date: 09/10/2023 CLINICAL DATA:  Concern  for vertebral artery dissection, stenosis, or occlusion EXAM: MRA HEAD WITHOUT CONTRAST TECHNIQUE: Angiographic images of the Circle of Willis were acquired using MRA technique without intravenous contrast. COMPARISON:  No prior MRA available, correlation is made with MRI 09/09/2023 FINDINGS: Anterior circulation: Both internal carotid arteries are patent to the termini, with severe stenosis in the proximal right cavernous ICA and and distal left petrous ICA, and moderate stenosis in the distal right cavernous ICA, bilateral supraclinoid ICA, left cavernous ICA, and distal left petrous ICA (series 2, images 68, 74, 80, and 87). A1 segments patent. The anterior communicating artery is not well seen due to artifact. Anterior cerebral arteries are patent to their distal aspects without significant stenosis. No M1 stenosis or occlusion. Distal MCA branches perfused to their distal aspects without significant stenosis. Posterior circulation: The left vertebral artery is patent to the vertebrobasilar junction with mild stenosis proximally (series 2, image 12). Poor signal throughout the intracranial right vertebral artery, with superimposed moderate to severe stenosis proximally (series 2, image 13) and likely moderate stenosis distally (series 2, images 36 and 44). Basilar patent to its distal aspect. Superior cerebellar arteries patent proximally. Patent P1 segments, with mild stenosis in the bilateral P1 segments (series 2, image 109 and 112). PCAs are otherwise perfused to their distal aspects without significant stenosis. The  bilateral posterior communicating arteries are not visualized. Anatomic variants: None significant IMPRESSION: 1. Poor signal throughout the intracranial right vertebral artery, with superimposed moderate to severe stenosis proximally and likely moderate stenosis distally. This could be better evaluated with a CTA of the head and neck if clinically indicated. 2. 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 3. Mild stenosis in the proximal left vertebral artery and bilateral P1 segments. Electronically Signed   By: Wiliam Ke M.D.   On: 09/10/2023 18:38   Scheduled Meds:  aspirin  81 mg Oral Daily   calcitRIOL  4 mcg Oral Q M,W,F   Chlorhexidine Gluconate Cloth  6 each Topical Q0600   cinacalcet  30 mg Oral Q M,W,F   guaiFENesin  600 mg Oral BID   heparin  5,000 Units Subcutaneous Q8H   insulin aspart  0-15 Units Subcutaneous Q4H   losartan  50 mg Oral Daily   sevelamer carbonate  1,600 mg Oral TID WC   sodium chloride flush  3 mL Intravenous Once   Continuous Infusions:  dextrose 50 mL/hr at 09/10/23 2023   levETIRAcetam 500 mg (09/10/23 2114)   And   levETIRAcetam Stopped (09/09/23 2144)     LOS: 4 days    Time spent: 35 mins    [name], MD [facility]   If 7PM-7AM, please contact night-coverage

## 2023-09-11 NOTE — Progress Notes (Signed)
  Patient's RN reported that patient has low-grade temperature and more somnolent as compared to his baseline.  Responding to voice.  Patient's wife at the bedside stated that this is patient's normal baseline after the dialysis.   Patient is also coughing up a lot of phlegm which is new per RN. Patient is hemodynamically stable.  O2 sat 97% room air. -Obtaining respiratory panel, chest x-ray, p.o. checking stat CBC and CMP.  Checking procalcitonin level.  Checking sputum culture. - Currently patient is already on Mucinex and Robitussin.   Tereasa Coop, MD [facility] 09/11/2023, 8:41 PM

## 2023-09-11 NOTE — Progress Notes (Signed)
Patient turned yellow MEWS this evening due to fever of 100.8. Assessment does show rhonchi throughout lung fields. Patient is somnolent and alert only to voice, which wife states is normal for him after HD. He is coughing up a lot of phlegm, which is new for him. Wife is concerned because he continues to swallow it rather than spitting it out. Suction at bedside. Educated wife on how to help him suction the phlegm should she witness this while at bedside. Good understanding with teach back. Tylenol given for fever.       09/11/23 1900  Assess: MEWS Score  Temp (!) 100.8 F (38.2 C)  BP (!) 155/85  MAP (mmHg) 105  Pulse Rate 93  ECG Heart Rate 94  Resp 20  SpO2 97 %  O2 Device Room Air  Assess: MEWS Score  MEWS Temp 1  MEWS Systolic 0  MEWS Pulse 0  MEWS RR 0  MEWS LOC 1  MEWS Score 2  MEWS Score Color Yellow  Assess: if the MEWS score is Yellow or Red  Were vital signs accurate and taken at a resting state? Yes  Does the patient meet 2 or more of the SIRS criteria? No  MEWS guidelines implemented  Yes, yellow  Treat  MEWS Interventions Considered administering scheduled or prn medications/treatments as ordered  Take Vital Signs  Increase Vital Sign Frequency  Yellow: Q2hr x1, continue Q4hrs until patient remains green for 12hrs  Escalate  MEWS: Escalate Yellow: Discuss with charge nurse and consider notifying provider and/or RRT  Notify: Charge Nurse/RN  Name of Charge Nurse/RN Notified Shiwangi, RN  Assess: SIRS CRITERIA  SIRS Temperature  0  SIRS Respirations  0  SIRS Pulse 1  SIRS WBC 0  SIRS Score Sum  1

## 2023-09-11 NOTE — Plan of Care (Signed)
  Problem: Education: Goal: Ability to describe self-care measures that may prevent or decrease complications (Diabetes Survival Skills Education) will improve Outcome: Progressing   Problem: Health Behavior/Discharge Planning: Goal: Ability to manage health-related needs will improve Outcome: Progressing   Problem: Health Behavior/Discharge Planning: Goal: Ability to manage health-related needs will improve Outcome: Progressing

## 2023-09-11 NOTE — Progress Notes (Signed)
I was present at this dialysis session. I have reviewed the session itself and made appropriate changes.   Filed Weights   09/09/23 1802 09/10/23 0534 09/11/23 0817  Weight: 72.8 kg 72.9 kg 74 kg    Recent Labs  Lab 09/10/23 0228  NA 131*  K 4.1  CL 93*  CO2 24  GLUCOSE 106*  BUN 42*  CREATININE 8.12*  CALCIUM 8.6*  PHOS 5.8*    Recent Labs  Lab 09/07/23 1641 09/07/23 1705 09/08/23 0247 09/09/23 0236 09/10/23 0228  WBC 9.3  --  8.3 6.8 7.9  NEUTROABS 7.9*  --  6.8  --   --   HGB 12.8*   < > 11.6* 12.1* 11.9*  HCT 39.7   < > 36.8* 38.3* 37.8*  MCV 80.9  --  82.5 83.6 82.2  PLT 93*  --  82* 74* 77*   < > = values in this interval not displayed.    Scheduled Meds:  aspirin  81 mg Oral Daily   calcitRIOL  4 mcg Oral Q M,W,F   Chlorhexidine Gluconate Cloth  6 each Topical Q0600   cinacalcet  30 mg Oral Q M,W,F   guaiFENesin  600 mg Oral BID   heparin  5,000 Units Subcutaneous Q8H   insulin aspart  0-15 Units Subcutaneous Q4H   losartan  50 mg Oral Daily   sevelamer carbonate  1,600 mg Oral TID WC   sodium chloride flush  3 mL Intravenous Once   Continuous Infusions:  dextrose 50 mL/hr at 09/10/23 2023   levETIRAcetam 500 mg (09/10/23 2114)   And   levETIRAcetam Stopped (09/09/23 2144)   PRN Meds:.acetaminophen **OR** acetaminophen, guaiFENesin-dextromethorphan, hydrALAZINE, LORazepam, ondansetron **OR** ondansetron (ZOFRAN) IV, oxyCODONE-acetaminophen   Louie Bun,  MD 09/11/2023, 10:03 AM

## 2023-09-11 NOTE — Progress Notes (Signed)
Nephrology Follow-Up Consult note   Assessment/Recommendations: [name] is a/an 73 y.o. male with a past medical history significant for ESRD, admitted for seizures.     Outpatient HD orders:  [name]  MWF  4 hours Noncompliant - skips HD every Wednesday against medical advice  400 BF 800 DF EDW 73.3 kg  Last tx 1/27 - left at 74 kg - treatment ended prematurely as above  AV graft 08/28/23 with post weight of 73.2 kg.  2 K/2 Calcium Meds: calcitriol 4 mcg three times a week; sensipar 30 mg three times a week with HD; venofer 100 mg IV with HD x 10; mircera 100 mcg every 2 weeks (last given 08/19/23; due today however didn't get outpatient per charting)   # Seizure  - Appreciate neurology - anti-epileptics per neurology   # Altered mental status  - Post-ictal as well as s/p PRN and with some degree of uremia. Overall improved - Optimize seizure treatment as above    # ESRD - Cont dialysis per schedule   # HTN  -BP somewhat elevated. Started losartan 50mg  daily. UF w/ HD.  Consider intensifying blood pressure regimen as needed   # Anemia of CKD  - Hgb at goal   # Secondary hyperparathyroidism, renal  - Cont binders, calcitriol, and sensipar    Recommendations conveyed to primary service.    [name] [facility] 09/11/2023 10:04 AM  ___________________________________________________________  CC: Seizure  Interval History/Subjective: Patient fairly tired answers minimal questions but denies any complaints.  Sounds like he did have some nausea possible vomiting this morning.   Medications:  Current Facility-Administered Medications  Medication Dose Route Frequency Provider Last Rate Last Admin   acetaminophen (TYLENOL) tablet 650 mg  650 mg Oral Q6H PRN [name], MD   650 mg at 09/10/23 1603   Or   acetaminophen (TYLENOL) suppository 650 mg  650 mg Rectal Q6H PRN [name], MD       aspirin chewable tablet 81 mg  81  mg Oral Daily [name], MD       calcitRIOL (ROCALTROL) capsule 4 mcg  4 mcg Oral Q M,W,F [name], MD   4 mcg at 09/09/23 1157   Chlorhexidine Gluconate Cloth 2 % PADS 6 each  6 each Topical Q0600 [name], MD   6 each at 09/11/23 0526   cinacalcet (SENSIPAR) tablet 30 mg  30 mg Oral Q M,W,F [name], MD   30 mg at 09/09/23 1158   dextrose 10 % infusion   Intravenous Continuous [name], MD 50 mL/hr at 09/10/23 2023 New Bag at 09/10/23 2023   guaiFENesin (MUCINEX) 12 hr tablet 600 mg  600 mg Oral BID [name], MD   600 mg at 09/10/23 2108   guaiFENesin-dextromethorphan (ROBITUSSIN DM) 100-10 MG/5ML syrup 5 mL  5 mL Oral Q4H PRN [name], MD   5 mL at 09/10/23 2108   heparin injection 5,000 Units  5,000 Units Subcutaneous Q8H [name], [name], MD   5,000 Units at 09/11/23 0558   hydrALAZINE (APRESOLINE) injection 10 mg  10 mg Intravenous Q4H PRN [name], Subrina, MD       insulin aspart (novoLOG) injection 0-15 Units  0-15 Units Subcutaneous Q4H [name], [name], MD   3 Units at 09/10/23 1253   levETIRAcetam (KEPPRA) IVPB 500 mg/100 mL premix  500 mg Intravenous Q24H [name], RPH 400 mL/hr at 09/10/23 2114 500 mg at 09/10/23 2114  And   levETIRAcetam (KEPPRA) 250 mg in sodium chloride 0.9 % 100 mL IVPB  250 mg Intravenous Q M,W,F-2000 [name], [facility]   Stopped at 09/09/23 2144   LORazepam (ATIVAN) injection 2 mg  2 mg Intravenous PRN [name], MD       losartan (COZAAR) tablet 50 mg  50 mg Oral Daily [name], Subrina, MD   50 mg at 09/11/23 0558   ondansetron (ZOFRAN) tablet 4 mg  4 mg Oral Q6H PRN [name], MD       Or   ondansetron (ZOFRAN) injection 4 mg  4 mg Intravenous Q6H PRN [name], [name], MD   4 mg at 09/11/23 0733   oxyCODONE-acetaminophen (PERCOCET/ROXICET) 5-325 MG per tablet 1 tablet  1 tablet Oral Q8H PRN [name], MD   1 tablet at 09/10/23 2119   sevelamer carbonate (RENVELA) tablet 1,600 mg  1,600  mg Oral TID WC [name], MD   1,600 mg at 09/11/23 0558   sodium chloride flush (NS) 0.9 % injection 3 mL  3 mL Intravenous Once [name], MD          Review of Systems: 10 systems reviewed and negative except per interval history/subjective  Physical Exam: Vitals:   09/11/23 0906 09/11/23 0921  BP: (!) 165/87 (!) 185/88  Pulse:    Resp:    Temp:    SpO2:     No intake/output data recorded.  Intake/Output Summary (Last 24 hours) at 09/11/2023 1004 Last data filed at 09/10/2023 2123 Gross per 24 hour  Intake 648.93 ml  Output --  Net 648.93 ml   Constitutional: well-appearing, tired, no distress ENMT: ears and nose without scars or lesions, MMM CV: normal rate, no edema Respiratory: bilateral chest rise, normal work of breathing Gastrointestinal: soft, non-tender, no palpable masses or hernias Skin: no visible lesions or rashes   Test Results I personally reviewed new and old clinical labs and radiology tests Lab Results  Component Value Date   NA 131 (L) 09/10/2023   K 4.1 09/10/2023   CL 93 (L) 09/10/2023   CO2 24 09/10/2023   BUN 42 (H) 09/10/2023   CREATININE 8.12 (H) 09/10/2023   GFR 44.63 (L) 01/18/2018   GLU 102 03/29/2020   CALCIUM 8.6 (L) 09/10/2023   ALBUMIN 2.7 (L) 09/09/2023   PHOS 5.8 (H) 09/10/2023    CBC Recent Labs  Lab 09/07/23 1641 09/07/23 1705 09/08/23 0247 09/09/23 0236 09/10/23 0228  WBC 9.3  --  8.3 6.8 7.9  NEUTROABS 7.9*  --  6.8  --   --   HGB 12.8*   < > 11.6* 12.1* 11.9*  HCT 39.7   < > 36.8* 38.3* 37.8*  MCV 80.9  --  82.5 83.6 82.2  PLT 93*  --  82* 74* 77*   < > = values in this interval not displayed.

## 2023-09-11 NOTE — TOC Initial Note (Signed)
Transition of Care [facility]) - Initial/Assessment Note    Patient Details  Name: [name] MRN: [id number] Date of Birth: September 10, 1950  Transition of Care [facility]) CM/SW Contact:    Harriet Masson, RN Phone Number: 09/11/2023, 2:53 PM  Clinical Narrative:                 Spoke to patient and wife at bedside regarding transition needs.  Agreeable to home health.  This RNCM offered choice for Home  Health, Patient states He has no preference, RNCM made referral to [facility] with [facility], He is able to take referral.  Agreeable to walker and BSC. Notified [employer] of order.  Address, Phone number and PCP verified. TOC following.  Expected Discharge Plan: Home w Home Health Services Barriers to Discharge: Continued Medical Work up   Patient Goals and CMS Choice Patient states their goals for this hospitalization and ongoing recovery are:: return home CMS Medicare.gov Compare Post Acute Care list provided to:: Patient Choice offered to / list presented to : Patient      Expected Discharge Plan and Services     Post Acute Care Choice: Home Health Living arrangements for the past 2 months: Single Family Home                                      Prior Living Arrangements/Services Living arrangements for the past 2 months: Single Family Home Lives with:: Spouse Patient language and need for interpreter reviewed:: Yes Do you feel safe going back to the place where you live?: Yes      Need for Family Participation in Patient Care: Yes (Comment) Care giver support system in place?: Yes (comment)   Criminal Activity/Legal Involvement Pertinent to Current Situation/Hospitalization: No - Comment as needed  Activities of Daily Living   ADL Screening (condition at time of admission) Independently performs ADLs?: No Does the patient have a NEW difficulty with bathing/dressing/toileting/self-feeding that is expected to last >3 days?: No (needs help) Does the patient have a NEW  difficulty with getting in/out of bed, walking, or climbing stairs that is expected to last >3 days?: No (needs help) Does the patient have a NEW difficulty with communication that is expected to last >3 days?: No Is the patient deaf or have difficulty hearing?: No Does the patient have difficulty seeing, even when wearing glasses/contacts?: No Does the patient have difficulty concentrating, remembering, or making decisions?: No  Permission Sought/Granted                  Emotional Assessment Appearance:: Appears stated age     Orientation: : Oriented to Situation, Oriented to  Time, Oriented to Place, Oriented to Self Alcohol / Substance Use: Not Applicable Psych Involvement: No (comment)  Admission diagnosis:  Somnolence [R40.0] Patient Active Problem List   Diagnosis Date Noted   (HFpEF) heart failure with preserved ejection fraction (HCC) 09/07/2023   T2DM (type 2 diabetes mellitus) (HCC) 09/07/2023   Elevated LFTs 09/07/2023   Somnolence 09/07/2023   Acute pain of left shoulder 11/25/2022   Acute on chronic diastolic CHF (congestive heart failure) (HCC) 10/05/2022   Acute respiratory failure with hypoxia (HCC) 10/05/2022   CHF (congestive heart failure) (HCC) 10/05/2022   Need for prophylactic vaccination with combined diphtheria-tetanus-pertussis (DTP) vaccine 12/26/2021   Need for prophylactic vaccination and inoculation against varicella 12/26/2021   PAD (peripheral artery disease) (HCC) 03/12/2021   Prostate  cancer screening 03/12/2021   Immunosuppression due to drug therapy (HCC) 09/16/2020   Dyslipidemia, goal LDL below 100 04/18/2020   Acquired ureteral stricture 11/11/2017   Type 2 diabetes mellitus with complication, without long-term current use of insulin (HCC) 05/07/2017   Anemia in chronic kidney disease 02/06/2014   End stage renal disease (HCC) 01/16/2012   ED (erectile dysfunction) 07/30/2011   Routine general medical examination at a health care  facility 07/30/2011   Kidney transplant status, cadaveric 01/30/2011   Hypertension 10/16/2010   PCP:  Etta Grandchild, MD Pharmacy:   [facility] [address] ([place]), [place] - [address] [place] ([place]) [place] [address] Phone: [phone] Fax: [phone]     Social Drivers of Health (SDOH) Social History: SDOH Screenings   Food Insecurity: Patient Declined (09/08/2023)  Housing: Patient Declined (09/10/2023)  Transportation Needs: Patient Declined (09/08/2023)  Utilities: Patient Declined (09/08/2023)  Depression (PHQ2-9): Low Risk  (11/25/2022)  Social Connections: Patient Declined (09/08/2023)  Tobacco Use: Low Risk  (09/08/2023)   SDOH Interventions:     Readmission Risk Interventions     No data to display

## 2023-09-11 NOTE — Progress Notes (Signed)
Carotid artery duplex has been completed. Preliminary results can be found in CV Proc through chart review.   09/11/23 3:38 PM Olen Cordial RVT

## 2023-09-11 NOTE — Progress Notes (Signed)
Received patient in bed.Alert and oriented x 2. Consent verified.  Access used : Left AVG that worked well.  Duration of treatment: 3.5 hours.  Net UF : 3 L  Tolerated treatment : Yes.  Hand off to the patient's nurse : Back into his room with stable medical condition via  transporter.

## 2023-09-12 DIAGNOSIS — R400 Somnolence: Secondary | ICD-10-CM | POA: Diagnosis not present

## 2023-09-12 LAB — CBC
HCT: 41.8 % (ref 39.0–52.0)
Hemoglobin: 12.6 g/dL — ABNORMAL LOW (ref 13.0–17.0)
MCH: 26 pg (ref 26.0–34.0)
MCHC: 30.1 g/dL (ref 30.0–36.0)
MCV: 86.2 fL (ref 80.0–100.0)
Platelets: 58 10*3/uL — ABNORMAL LOW (ref 150–400)
RBC: 4.85 MIL/uL (ref 4.22–5.81)
RDW: 19.6 % — ABNORMAL HIGH (ref 11.5–15.5)
WBC: 6.8 10*3/uL (ref 4.0–10.5)
nRBC: 0 % (ref 0.0–0.2)

## 2023-09-12 LAB — MAGNESIUM: Magnesium: 2.3 mg/dL (ref 1.7–2.4)

## 2023-09-12 LAB — BASIC METABOLIC PANEL
Anion gap: 16 — ABNORMAL HIGH (ref 5–15)
BUN: 39 mg/dL — ABNORMAL HIGH (ref 8–23)
CO2: 24 mmol/L (ref 22–32)
Calcium: 8.5 mg/dL — ABNORMAL LOW (ref 8.9–10.3)
Chloride: 91 mmol/L — ABNORMAL LOW (ref 98–111)
Creatinine, Ser: 8.34 mg/dL — ABNORMAL HIGH (ref 0.61–1.24)
GFR, Estimated: 6 mL/min — ABNORMAL LOW (ref >60)
Glucose, Bld: 105 mg/dL — ABNORMAL HIGH (ref 70–99)
Potassium: 5.1 mmol/L (ref 3.5–5.1)
Sodium: 131 mmol/L — ABNORMAL LOW (ref 135–145)

## 2023-09-12 LAB — RESPIRATORY PANEL BY PCR

## 2023-09-12 LAB — GLUCOSE, CAPILLARY
Glucose-Capillary: 100 mg/dL — ABNORMAL HIGH (ref 70–99)
Glucose-Capillary: 101 mg/dL — ABNORMAL HIGH (ref 70–99)
Glucose-Capillary: 105 mg/dL — ABNORMAL HIGH (ref 70–99)
Glucose-Capillary: 111 mg/dL — ABNORMAL HIGH (ref 70–99)
Glucose-Capillary: 68 mg/dL — ABNORMAL LOW (ref 70–99)
Glucose-Capillary: 77 mg/dL (ref 70–99)
Glucose-Capillary: 78 mg/dL (ref 70–99)
Glucose-Capillary: 88 mg/dL (ref 70–99)

## 2023-09-12 LAB — PHOSPHORUS: Phosphorus: 8 mg/dL — ABNORMAL HIGH (ref 2.5–4.6)

## 2023-09-12 MED ORDER — LOSARTAN POTASSIUM 50 MG PO TABS
50.0000 mg | ORAL_TABLET | Freq: Every day | ORAL | Status: DC
  Administered 2023-09-13: 50 mg via ORAL
  Filled 2023-09-12 (×2): qty 1

## 2023-09-12 MED ORDER — CARVEDILOL 25 MG PO TABS
25.0000 mg | ORAL_TABLET | Freq: Two times a day (BID) | ORAL | Status: DC
  Administered 2023-09-13: 25 mg via ORAL
  Filled 2023-09-12 (×2): qty 1

## 2023-09-12 MED ORDER — OSELTAMIVIR PHOSPHATE 30 MG PO CAPS: 30.0000 mg | ORAL_CAPSULE | ORAL | Status: DC

## 2023-09-12 MED ORDER — OSELTAMIVIR PHOSPHATE 30 MG PO CAPS
30.0000 mg | ORAL_CAPSULE | Freq: Once | ORAL | Status: AC
Start: 2023-09-12 — End: 2023-09-12
  Administered 2023-09-12: 30 mg via ORAL
  Filled 2023-09-12: qty 1

## 2023-09-12 NOTE — Progress Notes (Signed)
PROGRESS NOTE    [name]  NWG:[id number] DOB: 23-May-1951 DOA: 09/07/2023 PCP: Etta Grandchild, MD   Brief Narrative: This 73 year old male with PMH significant for ESRD on HD MWF, HTN, PAD, HLD.  Patient has missed his last 11 days of hemodialysis per wife because patient was too weak "to go out.  Last week he had 2 episodes of nausea and vomiting.  His appetite has been decreased.  Over the past week gotten progressively weaker, did complain of a headache, resolved with Tylenol.  No reports of fever or chills.  On arrival to hemodialysis patient noted to be weak, He was taken via wheelchair.  2-1/2 hours into hemodialysis he was noted to be aphasic and having abnormal movements in the right upper extremity.  He was sent to the ER as a code stroke.  In ER patient had seizures, He was given IM Versed and loaded with IV Keppra 1000 mg.  Had episode of hypoxemia , SPO2 70s, and hypotension systolic blood pressures to the 60s.  He was transiently placed on a nonrebreather.  Given 1 L IV fluid bolus.  BP responsive to fluids.  Since then patient has been poorly responsive with sonorus breathing.  Stat CT head done and negative.  Continuous EEG reviewed by Neurology, No evidence of seizures. Labs:  AST 169, ALT 525, AST 178 [new], platelets 93, INR 1.4, CXR pulmonary edema mild.  Patient was admitted for further evaluation, Nephrology and neurology is consulted.   Assessment & Plan:   Principal Problem:   Somnolence Active Problems:   Hypertension   End stage renal disease (HCC)   Dyslipidemia, goal LDL below 100   PAD (peripheral artery disease) (HCC)   (HFpEF) heart failure with preserved ejection fraction (HCC)   T2DM (type 2 diabetes mellitus) (HCC)   Elevated LFTs  Acute metabolic encephalopathy likely multifactorial: Differential diagnosis: Seizures, postictal, CVA, uremia, infection. CT head normal. Patient has not completed hemodialysis,  likely uremia. There is no evidence of  infection. Respiratory panel, hepatitis panel normal. Chest x-ray showed cardiomegaly with pulmonary edema. Patient seems improving. Continue to monitor clinical status. Continue hemodialysis as per schedule. EEG > No evidence of seizures.  Long-term EEG discontinued. MRI brain >  No acute intracranial abnormality found. Neurology recommended MRA Head and Carotid ultrasound to evaluate for vertebral artery occlusion. MRA completed. Neurology adding asa 81mg  daily. No other workup from neurology standpoint.  Neurology signed off.  Hyperkalemia:  > Resolved. Likely due to missed hemodialysis.   Continue dialysis as per nephrology  Seizure disorder: Patient was loaded with IV Keppra. Neurology recommended to continue Keppra twice daily. EEG > No evidence of seizures. LTM EEG >  no evidence of seizures. Neurology recommended MRI brain if negative , Neurology will sign off. Neuro recommended Valtoco ( rescue nasal spray). If not covered can prescribe clonazepam 2mg  ODT instead.  ESRD on hemodialysis: Nephrology on board, hemodialysis per MWF as scheduled.  Essential hypertension: Blood pressure is not controlled. Patient has been refusing losartan and oral hydralazine.   He just wants Coreg.  Continue Coreg 25 mg twice daily Continue IV hydralazine as needed.  Hyperlipidemia: LDL below 100.  Statin on hold.  Elevated  LFTs: Abdominal ultrasound normal.  Ammonia level normal,  Continue to monitor LFT.  Anemia of CKD: Hemoglobin at baseline.  Thrombocytopenia: No signs of any bleeding.  Continue to monitor platelet count.  Influenza A+ Patient spiked fever 100.8 last night. Tylenol given, continue to monitor.  Continue Tamiflu for 5 days   DVT prophylaxis: Heparin Code Status: Full code Family Communication: Wife at bedside. Disposition Plan:    Status is: Inpatient Remains inpatient appropriate because: Severity of illness.   Consultants:   Neurology Nephrology  Procedures: Hemodialysis  Antimicrobials:  Anti-infectives (From admission, onward)    Start     Dose/Rate Route Frequency Ordered Stop   09/14/23 1200  oseltamivir (TAMIFLU) capsule 30 mg       Placed in "Followed by" Linked Group   30 mg Oral Every M-W-F (Hemodialysis) 09/12/23 0343 09/21/23 1159   09/12/23 0430  oseltamivir (TAMIFLU) capsule 30 mg       Placed in "Followed by" Linked Group   30 mg Oral  Once 09/12/23 0343 09/12/23 0443      Subjective: Patient was seen and examined at bedside.  Overnight events noted.  Last night patient had fever 100.8, RVP shows influenza A+, started on Tamiflu.  Objective: Vitals:   09/12/23 0433 09/12/23 0808 09/12/23 1000 09/12/23 1143  BP: (!) 155/82 (!) 163/90 (!) 194/99 (!) 177/86  Pulse: 77  89   Resp: 17  20 18   Temp: 98.7 F (37.1 C) 97.9 F (36.6 C)  98.7 F (37.1 C)  TempSrc: Oral Axillary  Axillary  SpO2: 93%  99%   Weight:      Height:        Intake/Output Summary (Last 24 hours) at 09/12/2023 1323 Last data filed at 09/11/2023 1700 Gross per 24 hour  Intake 240 ml  Output --  Net 240 ml   Filed Weights   09/10/23 0534 09/11/23 0817 09/11/23 1215  Weight: 72.9 kg 74 kg 70.5 kg    Examination:  General exam: Appears comfortable, deconditioned, not in any acute distress. Respiratory system: CTA bilaterally. Respiratory effort normal.  RR 15 Cardiovascular system: S1 & S2 heard, RRR. No JVD, murmurs, rubs, gallops or clicks.  Gastrointestinal system: Abdomen is non distended, soft and nontender. Normal bowel sounds heard. Central nervous system: Groggy, following commands, arousable,  no focal neurological deficits. Extremities: No edema, no cyanosis, no clubbing. Skin: No rashes, lesions or ulcers Psychiatry:  Mood & affect appropriate.     Data Reviewed: I have personally reviewed following labs and imaging studies  CBC: Recent Labs  Lab 09/07/23 1641 09/07/23 1705  09/08/23 0247 09/09/23 0236 09/10/23 0228 09/11/23 2055 09/12/23 1033  WBC 9.3  --  8.3 6.8 7.9 4.9 6.8  NEUTROABS 7.9*  --  6.8  --   --   --   --   HGB 12.8*   < > 11.6* 12.1* 11.9* 11.0* 12.6*  HCT 39.7   < > 36.8* 38.3* 37.8* 35.4* 41.8  MCV 80.9  --  82.5 83.6 82.2 82.9 86.2  PLT 93*  --  82* 74* 77* 63* 58*   < > = values in this interval not displayed.   Basic Metabolic Panel: Recent Labs  Lab 09/07/23 1641 09/07/23 1705 09/08/23 0247 09/09/23 0236 09/10/23 0228 09/11/23 2055 09/12/23 1033  NA 137   < > 137 134* 131* 132* 131*  K 4.5   < > 5.2* 4.4 4.1 4.9 5.1  CL 92*  --  92* 93* 93* 94* 91*  CO2 22  --  25 25 24 26 24   GLUCOSE 100*  --  83 108* 106* 103* 105*  BUN 100*  --  103* 57* 42* 33* 39*  CREATININE 14.03*  --  15.49* 9.99* 8.12* 7.45* 8.34*  CALCIUM 8.7*  --  8.7* 8.6* 8.6* 8.3* 8.5*  MG 2.8*  --  2.8* 2.4 2.2  --  2.3  PHOS  --   --   --  6.2* 5.8*  --  8.0*   < > = values in this interval not displayed.   GFR: Estimated Creatinine Clearance: 8.2 mL/min (A) (by C-G formula based on SCr of 8.34 mg/dL (H)). Liver Function Tests: Recent Labs  Lab 09/07/23 1641 09/08/23 0247 09/09/23 0236  AST 169* 127* 124*  ALT 527* 446* 359*  ALKPHOS 178* 161* 148*  BILITOT 1.1 0.9 1.0  PROT 6.5 5.8* 5.7*  ALBUMIN 3.3* 2.8* 2.7*   Recent Labs  Lab 09/07/23 1641  LIPASE 33   Recent Labs  Lab 09/07/23 2340  AMMONIA 24   Coagulation Profile: Recent Labs  Lab 09/07/23 1641  INR 1.4*   Cardiac Enzymes: No results for input(s): "CKTOTAL", "CKMB", "CKMBINDEX", "TROPONINI" in the last 168 hours. BNP (last 3 results) No results for input(s): "PROBNP" in the last 8760 hours. HbA1C: No results for input(s): "HGBA1C" in the last 72 hours.  CBG: Recent Labs  Lab 09/12/23 0025 09/12/23 0100 09/12/23 0432 09/12/23 0812 09/12/23 1147  GLUCAP 68* 105* 78 100* 111*   Lipid Profile: No results for input(s): "CHOL", "HDL", "LDLCALC", "TRIG", "CHOLHDL",  "LDLDIRECT" in the last 72 hours. Thyroid Function Tests: No results for input(s): "TSH", "T4TOTAL", "FREET4", "T3FREE", "THYROIDAB" in the last 72 hours.  Anemia Panel: No results for input(s): "VITAMINB12", "FOLATE", "FERRITIN", "TIBC", "IRON", "RETICCTPCT" in the last 72 hours. Sepsis Labs: Recent Labs  Lab 09/07/23 1707 09/08/23 0247 09/08/23 0531 09/11/23 2055  PROCALCITON  --   --   --  5.42  LATICACIDVEN 2.4* 1.6 1.7  --     Recent Results (from the past 240 hours)  Resp panel by RT-PCR (RSV, Flu A&B, Covid) Anterior Nasal Swab     Status: None   Collection Time: 09/07/23  9:34 PM   Specimen: Anterior Nasal Swab  Result Value Ref Range Status   SARS Coronavirus 2 by RT PCR NEGATIVE NEGATIVE Final   Influenza A by PCR NEGATIVE NEGATIVE Final   Influenza B by PCR NEGATIVE NEGATIVE Final    Comment: (NOTE) The Xpert Xpress SARS-CoV-2/FLU/RSV plus assay is intended as an aid in the diagnosis of influenza from Nasopharyngeal swab specimens and should not be used as a sole basis for treatment. Nasal washings and aspirates are unacceptable for Xpert Xpress SARS-CoV-2/FLU/RSV testing.  Fact Sheet for Patients: BloggerCourse.com  Fact Sheet for Healthcare Providers: SeriousBroker.it  This test is not yet approved or cleared by the [place] FDA and has been authorized for detection and/or diagnosis of SARS-CoV-2 by FDA under an Emergency Use Authorization (EUA). This EUA will remain in effect (meaning this test can be used) for the duration of the COVID-19 declaration under Section 564(b)(1) of the Act, 21 U.S.C. section 360bbb-3(b)(1), unless the authorization is terminated or revoked.     Resp Syncytial Virus by PCR NEGATIVE NEGATIVE Final    Comment: (NOTE) Fact Sheet for Patients: BloggerCourse.com  Fact Sheet for Healthcare Providers: SeriousBroker.it  This  test is not yet approved or cleared by the [place] FDA and has been authorized for detection and/or diagnosis of SARS-CoV-2 by FDA under an Emergency Use Authorization (EUA). This EUA will remain in effect (meaning this test can be used) for the duration of the COVID-19 declaration under Section 564(b)(1) of the Act, 21 U.S.C. section 360bbb-3(b)(1), unless the authorization is terminated or revoked.  Performed at [facility], [address], [place], [place] [address]   MRSA Next Gen by PCR, Nasal     Status: None   Collection Time: 09/08/23  2:15 PM   Specimen: Nasal Mucosa; Nasal Swab  Result Value Ref Range Status   MRSA by PCR Next Gen NOT DETECTED NOT DETECTED Final    Comment: (NOTE) The GeneXpert MRSA Assay (FDA approved for NASAL specimens only), is one component of a comprehensive MRSA colonization surveillance program. It is not intended to diagnose MRSA infection nor to guide or monitor treatment for MRSA infections. Test performance is not FDA approved in patients less than 70 years old. Performed at [facility], [address] [place], [place] [address]   Respiratory (~20 pathogens) panel by PCR     Status: Abnormal   Collection Time: 09/11/23 10:27 PM   Specimen: Nasopharyngeal Swab; Respiratory  Result Value Ref Range Status   Adenovirus NOT DETECTED NOT DETECTED Final   Coronavirus 229E NOT DETECTED NOT DETECTED Final    Comment: (NOTE) The Coronavirus on the Respiratory Panel, DOES NOT test for the novel  Coronavirus (2019 nCoV)    Coronavirus HKU1 NOT DETECTED NOT DETECTED Final   Coronavirus NL63 NOT DETECTED NOT DETECTED Final   Coronavirus OC43 NOT DETECTED NOT DETECTED Final   Metapneumovirus NOT DETECTED NOT DETECTED Final   Rhinovirus / Enterovirus NOT DETECTED NOT DETECTED Final   Influenza A H3 DETECTED (A) NOT DETECTED Final   Influenza B NOT DETECTED NOT DETECTED Final   Parainfluenza Virus 1 NOT DETECTED NOT DETECTED Final    Parainfluenza Virus 2 NOT DETECTED NOT DETECTED Final   Parainfluenza Virus 3 NOT DETECTED NOT DETECTED Final   Parainfluenza Virus 4 NOT DETECTED NOT DETECTED Final   Respiratory Syncytial Virus NOT DETECTED NOT DETECTED Final   Bordetella pertussis NOT DETECTED NOT DETECTED Final   Bordetella Parapertussis NOT DETECTED NOT DETECTED Final   Chlamydophila pneumoniae NOT DETECTED NOT DETECTED Final   Mycoplasma pneumoniae NOT DETECTED NOT DETECTED Final    Comment: Performed at [facility], [address], [place], [place] [address]   Radiology Studies: DG CHEST PORT 1 VIEW Result Date: 09/11/2023 CLINICAL DATA:  Pneumonia EXAM: PORTABLE CHEST 1 VIEW COMPARISON:  09/07/2023 FINDINGS: With moderate cardiomegaly with small pleural effusions. No focal airspace consolidation or pulmonary edema. IMPRESSION: Moderate cardiomegaly with small pleural effusions. Electronically Signed   By: Deatra Robinson M.D.   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 Risk Factors:      Hypertension, Diabetes, PAD. Limitations        Today's exam was limited due to the high bifurcation of the                    carotid, the patient's respiratory variation and patient  positioning, patient movement, patient somnolence. Comparison Study:  No prior studies. Performing Technologist: Chanda Busing RVT  Examination Guidelines: A complete evaluation includes B-mode imaging, spectral Doppler, color Doppler, and power Doppler as  needed of all accessible portions of each vessel. Bilateral testing is considered an integral part of a complete examination. Limited examinations for reoccurring indications may be performed as noted.  Right Carotid Findings: +----------+--------+--------+--------+--------------------------+--------+           PSV cm/sEDV cm/sStenosisPlaque Description        Comments +----------+--------+--------+--------+--------------------------+--------+ CCA Prox  78      12              smooth and heterogenous            +----------+--------+--------+--------+--------------------------+--------+ CCA Distal61      13              smooth and heterogenous            +----------+--------+--------+--------+--------------------------+--------+ ICA Prox  70      30              irregular and heterogenous         +----------+--------+--------+--------+--------------------------+--------+ ICA Mid   60      23                                        tortuous +----------+--------+--------+--------+--------------------------+--------+ ICA Distal52      23                                        tortuous +----------+--------+--------+--------+--------------------------+--------+ ECA       81      9                                                  +----------+--------+--------+--------+--------------------------+--------+ +----------+--------+-------+--------+-------------------+           PSV cm/sEDV cmsDescribeArm Pressure (mmHG) +----------+--------+-------+--------+-------------------+ [id number]                                        +----------+--------+-------+--------+-------------------+ +---------+--------+--------+-----------------------+ VertebralPSV cm/sEDV cm/sAtypical and Retrograde +---------+--------+--------+-----------------------+  Left Carotid Findings: +----------+--------+--------+--------+-----------------------+--------+           PSV cm/sEDV  cm/sStenosisPlaque Description     Comments +----------+--------+--------+--------+-----------------------+--------+ CCA Prox  75      16              smooth and heterogenous         +----------+--------+--------+--------+-----------------------+--------+ CCA Distal74      19              smooth and heterogenous         +----------+--------+--------+--------+-----------------------+--------+ ICA Prox  51      20              smooth and heterogenous         +----------+--------+--------+--------+-----------------------+--------+ ICA Mid   45      17  tortuous +----------+--------+--------+--------+-----------------------+--------+ ICA Distal59      23                                     tortuous +----------+--------+--------+--------+-----------------------+--------+ ECA       77      8                                               +----------+--------+--------+--------+-----------------------+--------+ +----------+--------+--------+--------+-------------------+           PSV cm/sEDV cm/sDescribeArm Pressure (mmHG) +----------+--------+--------+--------+-------------------+ [id number]                                         +----------+--------+--------+--------+-------------------+ +---------+--------+---+--------+--+--------+ VertebralPSV cm/s122EDV cm/s39Atypical +---------+--------+---+--------+--+--------+   Summary: Right Carotid: Velocities in the right ICA are consistent with a 1-39% stenosis. Left Carotid: Velocities in the left ICA are consistent with a 1-39% stenosis. Vertebrals: Right vertebral artery demonstrates retrograde flow. Atypical             waveforms bilaterally. *See table(s) above for measurements and observations.     Preliminary    MR ANGIO HEAD WO CONTRAST Result Date: 09/10/2023 CLINICAL DATA:  Concern for vertebral artery dissection, stenosis, or occlusion EXAM: MRA HEAD WITHOUT  CONTRAST TECHNIQUE: Angiographic images of the Circle of Willis were acquired using MRA technique without intravenous contrast. COMPARISON:  No prior MRA available, correlation is made with MRI 09/09/2023 FINDINGS: Anterior circulation: Both internal carotid arteries are patent to the termini, with severe stenosis in the proximal right cavernous ICA and and distal left petrous ICA, and moderate stenosis in the distal right cavernous ICA, bilateral supraclinoid ICA, left cavernous ICA, and distal left petrous ICA (series 2, images 68, 74, 80, and 87). A1 segments patent. The anterior communicating artery is not well seen due to artifact. Anterior cerebral arteries are patent to their distal aspects without significant stenosis. No M1 stenosis or occlusion. Distal MCA branches perfused to their distal aspects without significant stenosis. Posterior circulation: The left vertebral artery is patent to the vertebrobasilar junction with mild stenosis proximally (series 2, image 12). Poor signal throughout the intracranial right vertebral artery, with superimposed moderate to severe stenosis proximally (series 2, image 13) and likely moderate stenosis distally (series 2, images 36 and 44). Basilar patent to its distal aspect. Superior cerebellar arteries patent proximally. Patent P1 segments, with mild stenosis in the bilateral P1 segments (series 2, image 109 and 112). PCAs are otherwise perfused to their distal aspects without significant stenosis. The bilateral posterior communicating arteries are not visualized. Anatomic variants: None significant IMPRESSION: 1. Poor signal throughout the intracranial right vertebral artery, with superimposed moderate to severe stenosis proximally and likely moderate stenosis distally. This could be better evaluated with a CTA of the head and neck if clinically indicated. 2. 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 3. Mild stenosis in the proximal left vertebral artery and bilateral P1 segments. Electronically Signed   By: Wiliam Ke M.D.   On: 09/10/2023 18:38   Scheduled Meds:  aspirin  81 mg Oral  Daily   calcitRIOL  4 mcg Oral Q M,W,F   carvedilol  25 mg Oral BID WC   Chlorhexidine Gluconate Cloth  6 each Topical Q0600   cinacalcet  30 mg Oral Q M,W,F   guaiFENesin  600 mg Oral BID   heparin  5,000 Units Subcutaneous Q8H   hydrALAZINE  25 mg Oral Q8H   insulin aspart  0-15 Units Subcutaneous Q4H   losartan  50 mg Oral Daily   [START ON 09/14/2023] oseltamivir  30 mg Oral Q M,W,F-HD   sevelamer carbonate  1,600 mg Oral TID WC   sodium chloride flush  3 mL Intravenous Once   Continuous Infusions:  dextrose 50 mL/hr at 09/12/23 0531   levETIRAcetam 500 mg (09/11/23 2121)   And   levETIRAcetam 250 mg (09/11/23 2017)     LOS: 5 days    Time spent: 35 mins    [name], MD [facility]   If 7PM-7AM, please contact night-coverage

## 2023-09-12 NOTE — Progress Notes (Signed)
Patient's blood pressure is elevated however he is declining to take oral hydralazine and losartan.  Patient is requesting for carvedilol.  He is also declining IV hydralazine as needed medication.  Tereasa Coop, MD [facility] 09/12/2023, 6:37 AM

## 2023-09-12 NOTE — Plan of Care (Signed)
  Problem: Education: Goal: Ability to describe self-care measures that may prevent or decrease complications (Diabetes Survival Skills Education) will improve Outcome: Progressing   Problem: Coping: Goal: Ability to adjust to condition or change in health will improve Outcome: Progressing   Problem: Fluid Volume: Goal: Ability to maintain a balanced intake and output will improve Outcome: Progressing   Problem: Fluid Volume: Goal: Ability to maintain a balanced intake and output will improve Outcome: Progressing

## 2023-09-12 NOTE — Progress Notes (Addendum)
Patient refused hydralazine scheduled for this morning. He stated, "it makes my blood pressure go up." Patient educated and wife also tried to convince him. He stilled refused.  Dr. Janalyn Shy paged, and rescheduled 1000 Cozaar for this morning until things could get figured out. Patient refused this as well.   He says he wants Carvedilol. Will let MD know. See Dr. Orest Dikes new orders.

## 2023-09-12 NOTE — Progress Notes (Signed)
Nephrology Follow-Up Consult note   Assessment/Recommendations: [name] is a/an 73 y.o. male with a past medical history significant for ESRD, admitted for seizures.     Outpatient HD orders:  [name]  MWF  4 hours Noncompliant - skips HD every Wednesday against medical advice  400 BF 800 DF EDW 73.3 kg  Last tx 1/27 - left at 74 kg - treatment ended prematurely as above  AV graft 08/28/23 with post weight of 73.2 kg.  2 K/2 Calcium Meds: calcitriol 4 mcg three times a week; sensipar 30 mg three times a week with HD; venofer 100 mg IV with HD x 10; mircera 100 mcg every 2 weeks (last given 08/19/23; due today however didn't get outpatient per charting)   # Seizure  - Appreciate neurology - anti-epileptics per neurology   # Altered mental status  -Multifactorial previously with seizures now with influenza contributing.  Management per primary team   # ESRD - Cont dialysis per schedule   # HTN  -BP somewhat elevated.  Continue medications as prescribed.  Patient refusing intermittently.   # Anemia of CKD  - Hgb at goal   # Secondary hyperparathyroidism, renal  - Cont binders, calcitriol, and sensipar   #Fever/Flu: Found to be influenza positive.  Manage per primary team   Recommendations conveyed to primary service.    [name] [name] 09/12/2023 8:54 AM  ___________________________________________________________  CC: Seizure  Interval History/Subjective: Patient continues to answer questions minimally.  Refusing some blood pressure medications.  Denies any complaints.  Tolerated dialysis with no issues yesterday   Medications:  Current Facility-Administered Medications  Medication Dose Route Frequency Provider Last Rate Last Admin   acetaminophen (TYLENOL) tablet 650 mg  650 mg Oral Q6H PRN [name], [name], MD   650 mg at 09/11/23 1945   Or   acetaminophen (TYLENOL) suppository 650 mg  650 mg Rectal Q6H PRN [name],  [name], MD       aspirin chewable tablet 81 mg  81 mg Oral Daily [name], MD   81 mg at 09/11/23 1700   calcitRIOL (ROCALTROL) capsule 4 mcg  4 mcg Oral Q M,W,F Greg Level, MD   4 mcg at 09/11/23 1700   carvedilol (COREG) tablet 25 mg  25 mg Oral BID WC [name], Subrina, MD       Chlorhexidine Gluconate Cloth 2 % PADS 6 each  6 each Topical Q0600 [name], MD   6 each at 09/12/23 [phone]   cinacalcet (SENSIPAR) tablet 30 mg  30 mg Oral Q M,W,F Greg Level, MD   30 mg at 09/11/23 1700   dextrose 10 % infusion   Intravenous Continuous [name], MD 50 mL/hr at 09/12/23 0531 New Bag at 09/12/23 0531   guaiFENesin (MUCINEX) 12 hr tablet 600 mg  600 mg Oral BID [name], MD   600 mg at 09/11/23 2315   guaiFENesin-dextromethorphan (ROBITUSSIN DM) 100-10 MG/5ML syrup 5 mL  5 mL Oral Q4H PRN [name], MD   5 mL at 09/10/23 2108   heparin injection 5,000 Units  5,000 Units Subcutaneous Q8H [name], [name], MD   5,000 Units at 09/12/23 0533   hydrALAZINE (APRESOLINE) injection 10 mg  10 mg Intravenous Q4H PRN [name], Subrina, MD   10 mg at 09/11/23 1459   hydrALAZINE (APRESOLINE) tablet 25 mg  25 mg Oral Q8H Khatri, Pardeep, MD   25 mg at 09/11/23 2315   insulin aspart (novoLOG) injection  0-15 Units  0-15 Units Subcutaneous Q4H [name], MD   2 Units at 09/11/23 2023   levETIRAcetam (KEPPRA) IVPB 500 mg/100 mL premix  500 mg Intravenous Q24H [name], RPH 400 mL/hr at 09/11/23 2121 500 mg at 09/11/23 2121   And   levETIRAcetam (KEPPRA) 250 mg in sodium chloride 0.9 % 100 mL IVPB  250 mg Intravenous Q M,W,F-2000 [name], RPH 410 mL/hr at 09/11/23 2017 250 mg at 09/11/23 2017   LORazepam (ATIVAN) injection 2 mg  2 mg Intravenous PRN [name], MD       losartan (COZAAR) tablet 50 mg  50 mg Oral Daily [name], Subrina, MD       ondansetron [facility]) tablet 4 mg  4 mg Oral Q6H PRN [name], MD       Or   ondansetron (ZOFRAN) injection  4 mg  4 mg Intravenous Q6H PRN [name], MD   4 mg at 09/11/23 0733   [START ON 09/14/2023] oseltamivir (TAMIFLU) capsule 30 mg  30 mg Oral Q M,W,F-HD Arabella Merles, RPH       oxyCODONE-acetaminophen (PERCOCET/ROXICET) 5-325 MG per tablet 1 tablet  1 tablet Oral Q8H PRN [name], MD   1 tablet at 09/12/23 0443   sevelamer carbonate (RENVELA) tablet 1,600 mg  1,600 mg Oral TID WC Greg Level, MD   1,600 mg at 09/12/23 0532   sodium chloride flush (NS) 0.9 % injection 3 mL  3 mL Intravenous Once [name], MD          Review of Systems: 10 systems reviewed and negative except per interval history/subjective  Physical Exam: Vitals:   09/12/23 0433 09/12/23 0808  BP: (!) 155/82 (!) 163/90  Pulse: 77   Resp: 17   Temp: 98.7 F (37.1 C) 97.9 F (36.6 C)  SpO2: 93%    No intake/output data recorded.  Intake/Output Summary (Last 24 hours) at 09/12/2023 0854 Last data filed at 09/11/2023 1700 Gross per 24 hour  Intake 240 ml  Output 3400 ml  Net -3160 ml   Constitutional: tired appearing, minimally interactive, nad ENMT: ears and nose without scars or lesions, MMM CV: normal rate, no edema Respiratory: bilateral chest rise, normal work of breathing Gastrointestinal: soft, non-tender, no palpable masses or hernias Skin: no visible lesions or rashes   Test Results I personally reviewed new and old clinical labs and radiology tests Lab Results  Component Value Date   NA 132 (L) 09/11/2023   K 4.9 09/11/2023   CL 94 (L) 09/11/2023   CO2 26 09/11/2023   BUN 33 (H) 09/11/2023   CREATININE 7.45 (H) 09/11/2023   GFR 44.63 (L) 01/18/2018   GLU 102 03/29/2020   CALCIUM 8.3 (L) 09/11/2023   ALBUMIN 2.7 (L) 09/09/2023   PHOS 5.8 (H) 09/10/2023    CBC Recent Labs  Lab 09/07/23 1641 09/07/23 1705 09/08/23 0247 09/09/23 0236 09/10/23 0228 09/11/23 2055  WBC 9.3  --  8.3 6.8 7.9 4.9  NEUTROABS 7.9*  --  6.8  --   --   --   HGB 12.8*   < > 11.6* 12.1* 11.9*  11.0*  HCT 39.7   < > 36.8* 38.3* 37.8* 35.4*  MCV 80.9  --  82.5 83.6 82.2 82.9  PLT 93*  --  82* 74* 77* 63*   < > = values in this interval not displayed.

## 2023-09-13 DIAGNOSIS — R400 Somnolence: Secondary | ICD-10-CM | POA: Diagnosis not present

## 2023-09-13 LAB — GLUCOSE, CAPILLARY
Glucose-Capillary: 87 mg/dL (ref 70–99)
Glucose-Capillary: 124 mg/dL — ABNORMAL HIGH (ref 70–99)

## 2023-09-13 MED ORDER — SENNOSIDES-DOCUSATE SODIUM 8.6-50 MG PO TABS: 1.0000 | ORAL_TABLET | Freq: Two times a day (BID) | ORAL | 0 refills | Status: AC

## 2023-09-13 MED ORDER — SENNOSIDES-DOCUSATE SODIUM 8.6-50 MG PO TABS
1.0000 | ORAL_TABLET | Freq: Two times a day (BID) | ORAL | Status: DC
Start: 2023-09-13 — End: 2023-09-13

## 2023-09-13 MED ORDER — CLONAZEPAM 0.5 MG PO TBDP: 2.0000 mg | ORAL_TABLET | Freq: Two times a day (BID) | ORAL | Status: DC

## 2023-09-13 MED ORDER — OXYCODONE-ACETAMINOPHEN 5-325 MG PO TABS: 1.0000 | ORAL_TABLET | Freq: Three times a day (TID) | ORAL | 0 refills | Status: AC | PRN

## 2023-09-13 MED ORDER — GUAIFENESIN ER 600 MG PO TB12: 600.0000 mg | ORAL_TABLET | Freq: Two times a day (BID) | ORAL | 0 refills | Status: AC

## 2023-09-13 MED ORDER — CLONAZEPAM 2 MG PO TBDP: 2.0000 mg | ORAL_TABLET | Freq: Every day | ORAL | 0 refills | Status: DC

## 2023-09-13 MED ORDER — LEVETIRACETAM 500 MG PO TABS: 500.0000 mg | ORAL_TABLET | Freq: Every day | ORAL | 0 refills | Status: DC

## 2023-09-13 MED ORDER — OSELTAMIVIR PHOSPHATE 30 MG PO CAPS: 30.0000 mg | ORAL_CAPSULE | ORAL | 0 refills | Status: DC

## 2023-09-13 MED ORDER — ASPIRIN 81 MG PO CHEW: 81.0000 mg | CHEWABLE_TABLET | Freq: Every day | ORAL | 0 refills | Status: AC

## 2023-09-13 MED ORDER — CINACALCET HCL 30 MG PO TABS: 30.0000 mg | ORAL_TABLET | ORAL | 0 refills | Status: DC

## 2023-09-13 NOTE — Progress Notes (Signed)
Nephrology Follow-Up Consult note   Assessment/Recommendations: [name] is a/an 73 y.o. male with a past medical history significant for ESRD, admitted for seizures.     Outpatient HD orders:  [name]  MWF  4 hours Noncompliant - skips HD every Wednesday against medical advice  400 BF 800 DF EDW 73.3 kg  Last tx 1/27 - left at 74 kg - treatment ended prematurely as above  AV graft 08/28/23 with post weight of 73.2 kg.  2 K/2 Calcium Meds: calcitriol 4 mcg three times a week; sensipar 30 mg three times a week with HD; venofer 100 mg IV with HD x 10; mircera 100 mcg every 2 weeks (last given 08/19/23; due today however didn't get outpatient per charting)   # Seizure  - Appreciate neurology - anti-epileptics per neurology   # Altered mental status  -Multifactorial previously with seizures now with influenza contributing.  Management per primary team   # ESRD - Cont dialysis per schedule   # HTN  -BP somewhat elevated.  Patient refusing meds intermittently. Stop hydral given it is tid. Continue losartan and encourage compliance   # Anemia of CKD  - Hgb at goal   # Secondary hyperparathyroidism, renal  - Cont binders, calcitriol, and sensipar   #Fever/Flu: Found to be influenza positive.  Manage per primary team   Recommendations conveyed to primary service.    [name]  [facility] 09/13/2023 8:34 AM  ___________________________________________________________  CC: Seizure  Interval History/Subjective: Says he has a headache but no other complaints.   Medications:  Current Facility-Administered Medications  Medication Dose Route Frequency Provider Last Rate Last Admin   acetaminophen (TYLENOL) tablet 650 mg  650 mg Oral Q6H PRN [name], MD   650 mg at 09/11/23 1945   Or   acetaminophen (TYLENOL) suppository 650 mg  650 mg Rectal Q6H PRN [name], MD       aspirin chewable tablet 81 mg  81 mg Oral Daily [name], MD   81 mg at 09/13/23 0981   calcitRIOL (ROCALTROL) capsule 4 mcg  4 mcg Oral Q M,W,F [name], MD   4 mcg at 09/11/23 1700   carvedilol (COREG) tablet 25 mg  25 mg Oral BID WC [name], Subrina, MD   25 mg at 09/13/23 1914   Chlorhexidine Gluconate Cloth 2 % PADS 6 each  6 each Topical Q0600 [name], MD   6 each at 09/13/23 0600   cinacalcet (SENSIPAR) tablet 30 mg  30 mg Oral Q M,W,F [name], MD   30 mg at 09/11/23 1700   dextrose 10 % infusion   Intravenous Continuous [name], MD 50 mL/hr at 09/12/23 0531 New Bag at 09/12/23 0531   guaiFENesin (MUCINEX) 12 hr tablet 600 mg  600 mg Oral BID [name], MD   600 mg at 09/13/23 0826   guaiFENesin-dextromethorphan (ROBITUSSIN DM) 100-10 MG/5ML syrup 5 mL  5 mL Oral Q4H PRN [name], MD   5 mL at 09/10/23 2108   heparin injection 5,000 Units  5,000 Units Subcutaneous Q8H [name], [name], MD   5,000 Units at 09/13/23 0600   hydrALAZINE (APRESOLINE) injection 10 mg  10 mg Intravenous Q4H PRN [name], Subrina, MD   10 mg at 09/12/23 1502   insulin aspart (novoLOG) injection 0-15 Units  0-15 Units Subcutaneous Q4H [name], [name], MD   2 Units at 09/11/23 2023   levETIRAcetam (KEPPRA) IVPB 500 mg/100 mL premix  500 mg Intravenous Q24H [name], RPH 400 mL/hr at 09/13/23 0827 Infusion Verify at 09/13/23 0827   And   levETIRAcetam (KEPPRA) 250 mg in sodium chloride 0.9 % 100 mL IVPB  250 mg Intravenous Q M,W,F-2000 [name], RPH 410 mL/hr at 09/11/23 2017 250 mg at 09/11/23 2017   LORazepam (ATIVAN) injection 2 mg  2 mg Intravenous PRN [name], MD       losartan (COZAAR) tablet 50 mg  50 mg Oral Daily Janalyn Shy, Subrina, MD   50 mg at 09/13/23 0826   ondansetron (ZOFRAN) tablet 4 mg  4 mg Oral Q6H PRN [name], MD       Or   ondansetron (ZOFRAN) injection 4 mg  4 mg Intravenous Q6H PRN [name], MD   4 mg at 09/11/23 0733   [START ON 09/14/2023] oseltamivir (TAMIFLU)  capsule 30 mg  30 mg Oral Q M,W,F-HD Arabella Merles, RPH       oxyCODONE-acetaminophen (PERCOCET/ROXICET) 5-325 MG per tablet 1 tablet  1 tablet Oral Q8H PRN [name], MD   1 tablet at 09/13/23 0825   sevelamer carbonate (RENVELA) tablet 1,600 mg  1,600 mg Oral TID WC [name], MD   1,600 mg at 09/13/23 1610   sodium chloride flush (NS) 0.9 % injection 3 mL  3 mL Intravenous Once [name], MD          Review of Systems: 10 systems reviewed and negative except per interval history/subjective  Physical Exam: Vitals:   09/13/23 0435 09/13/23 0725  BP: (!) 157/76 (!) 147/72  Pulse: 74   Resp: 19   Temp: 98.8 F (37.1 C) 98.6 F (37 C)  SpO2: 99%    Total I/O In: 206.7 [IV Piggyback:206.7] Out: -   Intake/Output Summary (Last 24 hours) at 09/13/2023 9604 Last data filed at 09/13/2023 0827 Gross per 24 hour  Intake 206.67 ml  Output --  Net 206.67 ml   Constitutional: tired appearing, sitting in bed eating breakfast, nad ENMT: ears and nose without scars or lesions, MMM CV: normal rate, no edema Respiratory: bilateral chest rise, normal work of breathing Gastrointestinal: soft, non-tender, no palpable masses or hernias Skin: no visible lesions or rashes   Test Results I personally reviewed new and old clinical labs and radiology tests Lab Results  Component Value Date   NA 131 (L) 09/12/2023   K 5.1 09/12/2023   CL 91 (L) 09/12/2023   CO2 24 09/12/2023   BUN 39 (H) 09/12/2023   CREATININE 8.34 (H) 09/12/2023   GFR 44.63 (L) 01/18/2018   GLU 102 03/29/2020   CALCIUM 8.5 (L) 09/12/2023   ALBUMIN 2.7 (L) 09/09/2023   PHOS 8.0 (H) 09/12/2023    CBC Recent Labs  Lab 09/07/23 1641 09/07/23 1705 09/08/23 0247 09/09/23 0236 09/10/23 0228 09/11/23 2055 09/12/23 1033  WBC 9.3  --  8.3   < > 7.9 4.9 6.8  NEUTROABS 7.9*  --  6.8  --   --   --   --   HGB 12.8*   < > 11.6*   < > 11.9* 11.0* 12.6*  HCT 39.7   < > 36.8*   < > 37.8* 35.4* 41.8  MCV 80.9   --  82.5   < > 82.2 82.9 86.2  PLT 93*  --  82*   < > 77* 63* 58*   < > = values in this interval not displayed.

## 2023-09-13 NOTE — Discharge Planning (Signed)
[facility] Patient Discharge Orders- [facility]: [name]  Patient's name: PETR BONTEMPO Admit/DC Dates: 09/07/2023 - 09/13/2023  Discharge Diagnoses: Acute metabolic encephalopathy   Influenza A Hyperkalemia  Aranesp: Given: No   Date and amount of last dose: NA  Last Hgb: 12.6 PRBC's Given: No Date/# of units: NA ESA dose for discharge: mircera per protocl IV Iron dose at discharge: per protocol  Heparin change: No  EDW Change: yes New EDW: 71.5 kg Please assess  [place] Change: No  Access intervention/Change: No Details:  [name]: No  Discharge Labs: Calcium 8.5 Phosphorus 8.0 Albumin 2.7 K+ 5.1  IV Antibiotics: No Details:  On Coumadin?: No Last INR: Next INR: Managed By:   OTHER/APPTS/LAB ORDERS:    D/C Meds to be reconciled by nurse after every discharge.  Completed By: Alonna Buckler [facility] [phone]    Reviewed by: MD:______ RN_______

## 2023-09-13 NOTE — Progress Notes (Signed)
SATURATION QUALIFICATIONS: (This note is used to comply with regulatory documentation for home oxygen)  Patient Saturations on Room Air at Rest = 92%  Patient Saturations on Room Air while Ambulating = 94%   

## 2023-09-13 NOTE — Discharge Summary (Signed)
Physician Discharge Summary  [name] GNF:[id number] DOB: Sep 10, 1950 DOA: 09/07/2023  PCP: Etta Grandchild, MD  Admit date: 09/07/2023  Discharge date: 09/13/2023  Admitted From: Home  Disposition:  Home Health Services.  Recommendations for Outpatient Follow-up:  Follow up with PCP in 1-2 weeks. Please obtain BMP/CBC in one week Advised to follow-up with Nephrology for continuation of hemodialysis. Advised to continue Tamiflu during hemodialysis for 3 more doses to complete 5-day treatment. Advised to take Keppra 500 mg daily for seizures. Advised to take aspirin 81 mg daily for carotid stenosis. Advised to take clonazepam 2 mg ODT for seizures more than 2 minutes.  Home Health: Home PT/OT Equipment/Devices: None  Discharge Condition: Stable CODE STATUS:Full code Diet recommendation: Renal Diet  Brief Summary/ Hospital Course: This 73 year old male with PMH significant for ESRD on HD MWF, HTN, PAD, HLD.  Patient has missed his last 11 days of hemodialysis per wife because patient was too weak "to go out.  Last week he had 2 episodes of nausea and vomiting.  His appetite has been decreased.  Over the past week gotten progressively weaker, did complain of a headache, resolved with Tylenol.  No reports of fever or chills.  On arrival to hemodialysis patient noted to be weak, He was taken via wheelchair.  2-1/2 hours into hemodialysis he was noted to be aphasic and having abnormal movements in the right upper extremity.  He was sent to the ER as a code stroke.  In ER patient had seizures, He was given IM Versed and loaded with IV Keppra 1000 mg.  Had episode of hypoxemia , SPO2 70s, and hypotension systolic blood pressures to the 60s.  He was transiently placed on a nonrebreather.  Given 1 L IV fluid bolus.  BP responsive to fluids.  Since then patient has been poorly responsive with sonorus breathing.  Stat CT head done and negative.  Continuous EEG reviewed by Neurology, No evidence  of seizures. Labs:  AST 169, ALT 525, AST 178 [new], platelets 93, INR 1.4, CXR pulmonary edema mild.  Patient was admitted for further evaluation, Nephrology and neurology is consulted.  He was continued on hemodialysis during hospitalization.  Neurology recommended to continue Keppra daily,  EEG shows No evidence of seizures, Long-term EEG >  No evidence of seizures, Neurology recommended clonazepam 2 mg as needed ODT for seizures lasting more than 2 minutes.  PT and OT recommended home health services.  Patient has spiked fever and found to have influenza A positive.  Patient started on Tamiflu during hemodialysis.  Patient remained afebrile for more than 24 hours before discharge.  Patient feels better and  wants to be discharged.  Discharge Diagnoses:  Principal Problem:   Somnolence Active Problems:   Hypertension   End stage renal disease (HCC)   Dyslipidemia, goal LDL below 100   PAD (peripheral artery disease) (HCC)   (HFpEF) heart failure with preserved ejection fraction (HCC)   T2DM (type 2 diabetes mellitus) (HCC)   Elevated LFTs  Acute metabolic encephalopathy likely multifactorial: Differential diagnosis: Seizures, postictal, CVA, uremia, infection. CT head normal. Patient has not completed hemodialysis in 2 weeks,  likely uremia. There is no evidence of infection. Respiratory panel, hepatitis panel normal. Chest x-ray showed cardiomegaly with pulmonary edema. Patient seems improving. Continue to monitor clinical status. Continue hemodialysis as per schedule. EEG > No evidence of seizures.  Long-term EEG discontinued. MRI brain >  No acute intracranial abnormality found. Neurology recommended MRA Head and Carotid ultrasound to  evaluate for vertebral artery occlusion. MRA completed. Neurology adding asa 81mg  daily. No other workup from neurology standpoint.  Neurology signed off.   Hyperkalemia:  > Resolved. Likely due to missed hemodialysis.   Continue dialysis as per  nephrology   Seizure disorder: Patient was loaded with IV Keppra. Neurology recommended to continue Keppra  daily. EEG > No evidence of seizures. LTM EEG >  no evidence of seizures. Neurology recommended MRI brain if negative , Neurology will sign off. Neuro recommended Valtoco ( rescue nasal spray). If not covered can prescribe clonazepam 2mg  ODT instead. Patient being discharged.  Neurology signed off   ESRD on hemodialysis: Nephrology on board, hemodialysis per MWF as scheduled.   Essential hypertension: Blood pressure is not controlled. Patient has been refusing losartan and oral hydralazine.   He just wants Coreg.  Continue Coreg 25 mg twice daily Continue IV hydralazine as needed.   Hyperlipidemia: LDL below 100.  Statin on hold.   Elevated  LFTs: Abdominal ultrasound normal.  Ammonia level normal,  Continue to monitor LFT.   Anemia of CKD: Hemoglobin at baseline.   Thrombocytopenia: No signs of any bleeding.  Continue to monitor platelet count.   Influenza A+ Patient spiked fever 100.8 last night. Tylenol given, continue to monitor.   Continue Tamiflu for 5 days.    Discharge Instructions  Discharge Instructions     Ambulatory referral to Neurology   Complete by: As directed    An appointment is requested in approximately: 8 -12 weeks   Call MD for:  difficulty breathing, headache or visual disturbances   Complete by: As directed    Call MD for:  persistant dizziness or light-headedness   Complete by: As directed    Call MD for:  persistant nausea and vomiting   Complete by: As directed    Diet - low sodium heart healthy   Complete by: As directed    Diet Carb Modified   Complete by: As directed    Discharge instructions   Complete by: As directed    Advised to follow-up with primary care physician in 1 week. Advised to follow-up with nephrology for continuation of hemodialysis. Advised to continue Tamiflu during hemodialysis for 3 more doses to  complete 5-day treatment. Advised to take Keppra 500 mg daily for seizures. Advised to take aspirin 81 mg daily for carotid stenosis. Advised to take clonazepam 02 mg ODT for seizures more than 2 minutes.   Increase activity slowly   Complete by: As directed       Allergies as of 09/13/2023   No Known Allergies      Medication List     STOP taking these medications    Lokelma 10 g Pack packet Generic drug: sodium zirconium cyclosilicate   meloxicam 15 MG tablet Commonly known as: MOBIC   sildenafil 20 MG tablet Commonly known as: REVATIO   Velphoro 500 MG chewable tablet Generic drug: sucroferric oxyhydroxide       TAKE these medications    acetaminophen 500 MG tablet Commonly known as: TYLENOL Take 1,000 mg by mouth every 8 (eight) hours as needed for moderate pain.   amLODipine 10 MG tablet Commonly known as: NORVASC Take 10 mg by mouth at bedtime.   aspirin 81 MG chewable tablet Chew 1 tablet (81 mg total) by mouth daily. Start taking on: September 14, 2023   calcitRIOL 0.5 MCG capsule Commonly known as: ROCALTROL Take 0.5 mcg by mouth See admin instructions. Take one tablet by mouth three  times a week on Monday, Wednesday and Fridays per wife   carvedilol 25 MG tablet Commonly known as: COREG Take 25 mg by mouth 2 (two) times daily with a meal.   cinacalcet 30 MG tablet Commonly known as: SENSIPAR Take 1 tablet (30 mg total) by mouth every Monday, Wednesday, and Friday. Start taking on: September 14, 2023   clonazepam 2 MG disintegrating tablet Commonly known as: KLONOPIN Take 1 tablet (2 mg total) by mouth daily.   guaiFENesin 600 MG 12 hr tablet Commonly known as: MUCINEX Take 1 tablet (600 mg total) by mouth 2 (two) times daily.   hydrALAZINE 25 MG tablet Commonly known as: APRESOLINE Take 25 mg by mouth 2 (two) times daily.   levETIRAcetam 500 MG tablet Commonly known as: Keppra Take 1 tablet (500 mg total) by mouth daily.   losartan 25  MG tablet Commonly known as: COZAAR Take 25 mg by mouth in the morning and at bedtime.   oseltamivir 30 MG capsule Commonly known as: TAMIFLU Take 1 capsule (30 mg total) by mouth every Monday, Wednesday, and Friday with hemodialysis for 3 doses. Start taking on: September 14, 2023   oxyCODONE-acetaminophen 5-325 MG tablet Commonly known as: PERCOCET/ROXICET Take 1 tablet by mouth every 8 (eight) hours as needed for up to 3 days for severe pain (pain score 7-10).   senna-docusate 8.6-50 MG tablet Commonly known as: Senokot-S Take 1 tablet by mouth 2 (two) times daily.   sevelamer carbonate 800 MG tablet Commonly known as: RENVELA Take 1,600 mg by mouth 3 (three) times daily with meals.               Durable Medical Equipment  (From admission, onward)           Start     Ordered   09/11/23 1519  For home use only DME Walker rolling  Once       Question Answer Comment  Walker: With 5 Inch Wheels   Patient needs a walker to treat with the following condition Weakness      09/11/23 1519   09/11/23 1519  For home use only DME Bedside commode  Once       Question:  Patient needs a bedside commode to treat with the following condition  Answer:  Weakness   09/11/23 1519            Follow-up Information     [facility] Follow [facility].   Specialty: Home Health Services Why: Home health has been arranged. They will contact you to schedule apt within 48hrs post discharge. Contact information: [address] [place] [address] [phone]         Etta Grandchild, MD Follow up in 1 week(s).   Specialty: Internal Medicine Contact information: [address] [place] [address] [phone]         [name], MD Follow up in 2 week(s).   Specialty: Neurology Contact information: [address] [place] [address] [phone]                No Known  Allergies  Consultations: Nephrology Neurology   Procedures/Studies: VAS US CAROTID Result Date: 09/12/2023 Carotid Arterial Duplex Study Patient Name:  [name]  Date of Exam:   09/11/2023 Medical Rec #: [id number]          Accession #:    [id number] Date of Birth: [birth year]  Patient Gender: M Patient Age:   67 years Exam [facility]:  [facility] Procedure:      VAS US CAROTID Referring Phys: Lorrin Jackson YADAV --------------------------------------------------------------------------------  Indications:       Occlusion, 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 Risk Factors:      Hypertension, Diabetes, PAD. Limitations        Today's exam was limited due to the high bifurcation of the                    carotid, the patient's respiratory variation and patient                    positioning, patient movement, patient somnolence. Comparison Study:  No prior studies. Performing Technologist: Chanda Busing RVT  Examination Guidelines: A complete evaluation includes B-mode imaging, spectral Doppler, color Doppler, and power Doppler as needed of all accessible portions of each vessel. Bilateral testing is considered an integral part of a complete examination. Limited examinations for reoccurring indications may be performed as noted.  Right Carotid Findings: +----------+--------+--------+--------+--------------------------+--------+           PSV cm/sEDV cm/sStenosisPlaque Description        Comments +----------+--------+--------+--------+--------------------------+--------+ CCA Prox  1       0               smooth and heterogenous            +----------+--------+--------+--------+--------------------------+--------+ CCA Distal1       0               smooth and heterogenous            +----------+--------+--------+--------+--------------------------+--------+ ICA Prox  1       0               irregular and heterogenous          +----------+--------+--------+--------+--------------------------+--------+ ICA Mid   1       0                                         tortuous +----------+--------+--------+--------+--------------------------+--------+ ICA Distal1       0                                         tortuous +----------+--------+--------+--------+--------------------------+--------+ ECA       1       0                                                  +----------+--------+--------+--------+--------------------------+--------+ +----------+--------+-------+--------+-------------------+           PSV cm/sEDV cmsDescribeArm Pressure (mmHG) +----------+--------+-------+--------+-------------------+ [id number]                                          +----------+--------+-------+--------+-------------------+ +---------+--------+--------+-----------------------+ VertebralPSV cm/sEDV cm/sAtypical and Retrograde +---------+--------+--------+-----------------------+  Left Carotid Findings: +----------+--------+--------+--------+-----------------------+--------+           PSV cm/sEDV cm/sStenosisPlaque Description     Comments +----------+--------+--------+--------+-----------------------+--------+ CCA Prox  1       0  smooth and heterogenous         +----------+--------+--------+--------+-----------------------+--------+ CCA Distal1       0               smooth and heterogenous         +----------+--------+--------+--------+-----------------------+--------+ ICA Prox  1       0               smooth and heterogenous         +----------+--------+--------+--------+-----------------------+--------+ ICA Mid   0       0                                      tortuous +----------+--------+--------+--------+-----------------------+--------+ ICA Distal1       0                                      tortuous  +----------+--------+--------+--------+-----------------------+--------+ ECA       1       0                                               +----------+--------+--------+--------+-----------------------+--------+ +----------+--------+--------+--------+-------------------+           PSV cm/sEDV cm/sDescribeArm Pressure (mmHG) +----------+--------+--------+--------+-------------------+ [id number]                                           +----------+--------+--------+--------+-------------------+ +---------+--------+-+--------+-+--------+ VertebralPSV cm/s1EDV cm/s0Atypical +---------+--------+-+--------+-+--------+   Summary: Right Carotid: Velocities in the right ICA are consistent with a 1-39% stenosis. Left Carotid: Velocities in the left ICA are consistent with a 1-39% stenosis. Vertebrals: Right vertebral artery demonstrates retrograde flow. Atypical             waveforms bilaterally. *See table(s) above for measurements and observations.  Electronically signed by Lemar Livings MD on 09/12/2023 at 4:07:14 PM.    Final    DG CHEST PORT 1 VIEW Result Date: 09/11/2023 CLINICAL DATA:  Pneumonia EXAM: PORTABLE CHEST 1 VIEW COMPARISON:  09/07/2023 FINDINGS: With moderate cardiomegaly with small pleural effusions. No focal airspace consolidation or pulmonary edema. IMPRESSION: Moderate cardiomegaly with small pleural effusions. Electronically Signed   By: Deatra Robinson M.D.   On: 09/11/2023 21:28   MR ANGIO HEAD WO CONTRAST Result Date: 09/10/2023 CLINICAL DATA:  Concern for vertebral artery dissection, stenosis, or occlusion EXAM: MRA HEAD WITHOUT CONTRAST TECHNIQUE: Angiographic images of the Circle of Willis were acquired using MRA technique without intravenous contrast. COMPARISON:  No prior MRA available, correlation is made with MRI 09/09/2023 FINDINGS: Anterior circulation: Both internal carotid arteries are patent to the termini, with severe stenosis in the proximal right cavernous  ICA and and distal left petrous ICA, and moderate stenosis in the distal right cavernous ICA, bilateral supraclinoid ICA, left cavernous ICA, and distal left petrous ICA (series 2, images 68, 74, 80, and 87). A1 segments patent. The anterior communicating artery is not well seen due to artifact. Anterior cerebral arteries are patent to their distal aspects without significant stenosis. No M1 stenosis or occlusion. Distal MCA branches perfused to their distal aspects without significant stenosis. Posterior circulation: The  left vertebral artery is patent to the vertebrobasilar junction with mild stenosis proximally (series 2, image 12). Poor signal throughout the intracranial right vertebral artery, with superimposed moderate to severe stenosis proximally (series 2, image 13) and likely moderate stenosis distally (series 2, images 36 and 44). Basilar patent to its distal aspect. Superior cerebellar arteries patent proximally. Patent P1 segments, with mild stenosis in the bilateral P1 segments (series 2, image 109 and 112). PCAs are otherwise perfused to their distal aspects without significant stenosis. The bilateral posterior communicating arteries are not visualized. Anatomic variants: None significant IMPRESSION: 1. Poor signal throughout the intracranial right vertebral artery, with superimposed moderate to severe stenosis proximally and likely moderate stenosis distally. This could be better evaluated with a CTA of the head and neck if clinically indicated. 2. 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 3. Mild stenosis in the proximal left vertebral artery and bilateral P1 segments. Electronically Signed   By: Wiliam Ke M.D.   On: 09/10/2023 18:38   MR BRAIN WO CONTRAST Result Date: 09/09/2023 CLINICAL DATA:  Seizure, new onset, no  history of trauma EXAM: MRI HEAD WITHOUT CONTRAST TECHNIQUE: Multiplanar, multiecho pulse sequences of the brain and surrounding structures were obtained without intravenous contrast. COMPARISON:  None Available. FINDINGS: Evaluation is somewhat limited by motion artifact. Brain: The hippocampi are roughly symmetric in size and signal. No heterotopia or evidence of cortical dysgenesis. No restricted diffusion to suggest acute or subacute infarct. No acute hemorrhage, mass, mass effect, or midline shift. No hydrocephalus or extra-axial collection. Pituitary and craniocervical junction within normal limits. No hemosiderin deposition to suggest remote hemorrhage. Mildly advanced cerebral atrophy for age, with prominence of the ventricles and sulci. Confluent T2 hyperintense signal in the periventricular white matter, likely the sequela of moderate chronic small vessel ischemic disease. Vascular: Loss of the right vertebral artery flow void (series 5, image 6). Otherwise normal arterial flow voids. Skull and upper cervical spine: Normal marrow signal. Sinuses/Orbits: Mucous retention cysts in the left maxillary sinus. No acute finding in the orbits. Other: The mastoid air cells are well aerated. IMPRESSION: 1. No acute intracranial process. No seizure focus is identified. 2. Loss of the right vertebral artery flow void, which is nonspecific but can be seen in the setting of slow flow or occlusion. If there is clinical concern for vertebral artery pathology, consider further evaluation with CTA. Electronically Signed   By: Wiliam Ke M.D.   On: 09/09/2023 16:19   Overnight EEG with video Result Date: 09/08/2023 [name], MD     09/09/2023  8:52 AM Patient Name: RISHAN OYAMA MRN: [id number] Epilepsy Attending: [name] Referring Physician/Provider: Jefferson Fuel, MD Duration: 09/07/2023 1744 to 09/08/2023 2300 Patient history: 73 year old man with a past medical history of DM2, end-stage renal  disease on HD Monday Wednesday Friday who presented with R focal seizure in the setting of missing dialysis x11 days. EEG to evaluate for seizure Level of alertness: Awake, asleep AEDs during EEG study: LEV Technical aspects: This EEG study was done with scalp electrodes positioned according to the 10-20 International system of electrode placement. Electrical activity was reviewed with band pass filter of 1-70Hz , sensitivity of 7 uV/mm, display speed of 45mm/sec with a 60Hz  notched filter applied as appropriate. EEG data were recorded continuously and digitally stored.  Video monitoring was  available and reviewed as appropriate. Description: No posterior dominant rhythm was seen. Sleep was characterized by sleep spindles (12 to 14 Hz), maximal frontocentral region. EEG showed continuous generalized polymorphic sharply contoured 3 to 6 Hz theta-delta slowing. Generalized periodic discharges with triphasic morphology were also noted at 1 to 1.5 Hz.  Hyperventilation and photic stimulation were not performed.   EEG was disconnected between 09/08/2023 1503 to 2011 for HD ABNORMALITY -Periodic discharges with triphasic morphology, generalized - Continuous slow, generalized IMPRESSION: This study is suggestive of moderate diffuse encephalopathy most likely secondary to toxic-metabolic causes. No seizures or definite epileptiform discharges were seen throughout the recording. Priyanka Annabelle Harman   US Abdomen Limited RUQ (LIVER/GB) Result Date: 09/07/2023 CLINICAL DATA:  Elevated LFTs. EXAM: ULTRASOUND ABDOMEN LIMITED RIGHT UPPER QUADRANT COMPARISON:  None Available. FINDINGS: Gallbladder: Physiologically distended. No gallstones. Upper normal gallbladder wall thickness of 3 mm, nonspecific in the presence of ascites. No sonographic Murphy sign noted by sonographer. Common bile duct: Diameter: 6 mm, normal. Liver: No focal lesion identified. Within normal limits in parenchymal echogenicity. No convincing capsular nodularity.  Portal vein is patent on color Doppler imaging with normal direction of blood flow towards the liver. Other: Moderate right upper quadrant ascites. IMPRESSION: 1. Moderate right upper quadrant ascites. No definite morphologic changes of cirrhosis. 2. Upper normal gallbladder wall thickness is nonspecific in setting of ascites. No gallstones. 3. No biliary dilatation or focal liver lesion. Electronically Signed   By: Narda Rutherford M.D.   On: 09/07/2023 21:58   DG Chest Portable 1 View Result Date: 09/07/2023 CLINICAL DATA:  Altered mental status.  Code stroke EXAM: PORTABLE CHEST 1 VIEW COMPARISON:  10/05/2022 FINDINGS: Similar marked cardiomegaly. Aortic atherosclerotic calcification. Pulmonary vascular congestion and interstitial coarsening. Small bilateral pleural effusions. No pneumothorax. IMPRESSION: Cardiomegaly with pulmonary edema and small bilateral pleural effusions. Electronically Signed   By: Minerva Fester M.D.   On: 09/07/2023 19:51   CT HEAD CODE STROKE WO CONTRAST Result Date: 09/07/2023 CLINICAL DATA:  Code stroke.  Unresponsive EXAM: CT HEAD WITHOUT CONTRAST TECHNIQUE: Contiguous axial images were obtained from the base of the skull through the vertex without intravenous contrast. RADIATION DOSE REDUCTION: This exam was performed according to the departmental dose-optimization program which includes automated exposure control, adjustment of the mA and/or kV according to patient size and/or use of iterative reconstruction technique. COMPARISON:  None Available. FINDINGS: Brain: Possible loss of gray-white differentiation bilateral anterior frontal lobes (series 2, image 25) is favored to be artifactual. No other evidence of acute infarct. No evidence of acute hemorrhage, mass, mass effect, or midline shift. No hydrocephalus or extra-axial collection. Vascular: No hyperdense vessel. Atherosclerotic calcifications in the intracranial carotid and vertebral arteries. Skull: Negative for  fracture or focal lesion. Sinuses/Orbits: Mucosal thickening and mucous retention cysts in the maxillary sinuses. No acute finding in the orbits. Other: The mastoid air cells are well aerated. ASPECTS (Alberta Stroke Program Early CT Score) - Ganglionic level infarction (caudate, lentiform nuclei, internal capsule, insula, M1-M3 cortex): 7 - Supraganglionic infarction (M4-M6 cortex): 3 Total score (0-10 with 10 being normal): 10 IMPRESSION: Possible loss of gray-white differentiation in the bilateral anterior frontal lobes, likely artifactual. No other evidence of acute infarct. No acute intracranial hemorrhage. Imaging results were communicated on 09/07/2023 at 4:35 pm to provider STACK via secure text paging. Electronically Signed   By: Wiliam Ke M.D.   On: 09/07/2023 16:35     Subjective: Patient was seen and examined at bedside.  Overnight events  noted.   Patient feels much improved,  alert and fully awake,  following commands.  Patient wants to be discharged.  Discharge Exam: Vitals:   09/13/23 0435 09/13/23 0725  BP: (!) 157/76 (!) 147/72  Pulse: 74   Resp: 19   Temp: 98.8 F (37.1 C) 98.6 F (37 C)  SpO2: 99%    Vitals:   09/12/23 2326 09/13/23 0433 09/13/23 0435 09/13/23 0725  BP: (!) 131/48 (!) 162/54 (!) 157/76 (!) 147/72  Pulse: 77 78 74   Resp: 11 17 19    Temp: 98.9 F (37.2 C) 98.8 F (37.1 C) 98.8 F (37.1 C) 98.6 F (37 C)  TempSrc: Oral Oral Oral Oral  SpO2: 98% 98% 99%   Weight:      Height:        General: Pt is alert, awake, not in acute distress Cardiovascular: RRR, S1/S2 +, no rubs, no gallops Respiratory: CTA bilaterally, no wheezing, no rhonchi Abdominal: Soft, NT, ND, bowel sounds + Extremities: no edema, no cyanosis    The results of significant diagnostics from this hospitalization (including imaging, microbiology, ancillary and laboratory) are listed below for reference.     Microbiology: Recent Results (from the past 240 hours)  Resp  panel by RT-PCR (RSV, Flu A&B, Covid) Anterior Nasal Swab     Status: None   Collection Time: 09/07/23  9:34 PM   Specimen: Anterior Nasal Swab  Result Value Ref Range Status   SARS Coronavirus 2 by RT PCR NEGATIVE NEGATIVE Final   Influenza A by PCR NEGATIVE NEGATIVE Final   Influenza B by PCR NEGATIVE NEGATIVE Final    Comment: (NOTE) The Xpert Xpress SARS-CoV-2/FLU/RSV plus assay is intended as an aid in the diagnosis of influenza from Nasopharyngeal swab specimens and should not be used as a sole basis for treatment. Nasal washings and aspirates are unacceptable for Xpert Xpress SARS-CoV-2/FLU/RSV testing.  Fact Sheet for Patients: BloggerCourse.com  Fact Sheet for Healthcare Providers: SeriousBroker.it  This test is not yet approved or cleared by the [place] FDA and has been authorized for detection and/or diagnosis of SARS-CoV-2 by FDA under an Emergency Use Authorization (EUA). This EUA will remain in effect (meaning this test can be used) for the duration of the COVID-19 declaration under Section 564(b)(1) of the Act, 21 U.S.C. section 360bbb-3(b)(1), unless the authorization is terminated or revoked.     Resp Syncytial Virus by PCR NEGATIVE NEGATIVE Final    Comment: (NOTE) Fact Sheet for Patients: BloggerCourse.com  Fact Sheet for Healthcare Providers: SeriousBroker.it  This test is not yet approved or cleared by the [place] FDA and has been authorized for detection and/or diagnosis of SARS-CoV-2 by FDA under an Emergency Use Authorization (EUA). This EUA will remain in effect (meaning this test can be used) for the duration of the COVID-19 declaration under Section 564(b)(1) of the Act, 21 U.S.C. section 360bbb-3(b)(1), unless the authorization is terminated or revoked.  Performed at [facility], [address] [place], [place] [address]    MRSA Next Gen by PCR, Nasal     Status: None   Collection Time: 09/08/23  2:15 PM   Specimen: Nasal Mucosa; Nasal Swab  Result Value Ref Range Status   MRSA by PCR Next Gen NOT DETECTED NOT DETECTED Final    Comment: (NOTE) The GeneXpert MRSA Assay (FDA approved for NASAL specimens only), is one component of a comprehensive MRSA colonization surveillance program. It is not intended to diagnose MRSA infection nor to guide  or monitor treatment for MRSA infections. Test performance is not FDA approved in patients less than 50 years old. Performed at [facility], [address], [place], [place] [address]   Respiratory (~20 pathogens) panel by PCR     Status: Abnormal   Collection Time: 09/11/23 10:27 PM   Specimen: Nasopharyngeal Swab; Respiratory  Result Value Ref Range Status   Adenovirus NOT DETECTED NOT DETECTED Final   Coronavirus 229E NOT DETECTED NOT DETECTED Final    Comment: (NOTE) The Coronavirus on the Respiratory Panel, DOES NOT test for the novel  Coronavirus (2019 nCoV)    Coronavirus HKU1 NOT DETECTED NOT DETECTED Final   Coronavirus NL63 NOT DETECTED NOT DETECTED Final   Coronavirus OC43 NOT DETECTED NOT DETECTED Final   Metapneumovirus NOT DETECTED NOT DETECTED Final   Rhinovirus / Enterovirus NOT DETECTED NOT DETECTED Final   Influenza A H3 DETECTED (A) NOT DETECTED Final   Influenza B NOT DETECTED NOT DETECTED Final   Parainfluenza Virus 1 NOT DETECTED NOT DETECTED Final   Parainfluenza Virus 2 NOT DETECTED NOT DETECTED Final   Parainfluenza Virus 3 NOT DETECTED NOT DETECTED Final   Parainfluenza Virus 4 NOT DETECTED NOT DETECTED Final   Respiratory Syncytial Virus NOT DETECTED NOT DETECTED Final   Bordetella pertussis NOT DETECTED NOT DETECTED Final   Bordetella Parapertussis NOT DETECTED NOT DETECTED Final   Chlamydophila pneumoniae NOT DETECTED NOT DETECTED Final   Mycoplasma pneumoniae NOT DETECTED NOT DETECTED Final    Comment: Performed at [facility], [address], [place], [place] [address]     Labs: BNP (last 3 results) No results for input(s): "BNP" in the last 8760 hours. Basic Metabolic Panel: Recent Labs  Lab 09/07/23 1641 09/07/23 1705 09/08/23 0247 09/09/23 0236 09/10/23 0228 09/11/23 2055 09/12/23 1033  NA 137   < > 137 134* 131* 132* 131*  K 4.5   < > 5.2* 4.4 4.1 4.9 5.1  CL 92*  --  92* 93* 93* 94* 91*  CO2 22  --  25 25 24 26 24   GLUCOSE 100*  --  83 108* 106* 103* 105*  BUN 100*  --  103* 57* 42* 33* 39*  CREATININE 14.03*  --  15.49* 9.99* 8.12* 7.45* 8.34*  CALCIUM 8.7*  --  8.7* 8.6* 8.6* 8.3* 8.5*  MG 2.8*  --  2.8* 2.4 2.2  --  2.3  PHOS  --   --   --  6.2* 5.8*  --  8.0*   < > = values in this interval not displayed.   Liver Function Tests: Recent Labs  Lab 09/07/23 1641 09/08/23 0247 09/09/23 0236  AST 169* 127* 124*  ALT 527* 446* 359*  ALKPHOS 178* 161* 148*  BILITOT 1.1 0.9 1.0  PROT 6.5 5.8* 5.7*  ALBUMIN 3.3* 2.8* 2.7*   Recent Labs  Lab 09/07/23 1641  LIPASE 33   Recent Labs  Lab 09/07/23 2340  AMMONIA 24   CBC: Recent Labs  Lab 09/07/23 1641 09/07/23 1705 09/08/23 0247 09/09/23 0236 09/10/23 0228 09/11/23 2055 09/12/23 1033  WBC 9.3  --  8.3 6.8 7.9 4.9 6.8  NEUTROABS 7.9*  --  6.8  --   --   --   --   HGB 12.8*   < > 11.6* 12.1* 11.9* 11.0* 12.6*  HCT 39.7   < > 36.8* 38.3* 37.8* 35.4* 41.8  MCV 80.9  --  82.5 83.6 82.2 82.9 86.2  PLT 93*  --  82* 74* 77* 63* 58*   < > = values in this interval not displayed.   Cardiac Enzymes: No results for input(s): "CKTOTAL", "CKMB", "CKMBINDEX", "TROPONINI" in the last 168 hours. BNP: Invalid input(s): "POCBNP" CBG: Recent Labs  Lab 09/12/23 1636 09/12/23 2045 09/12/23 2350 09/13/23 0428 09/13/23 0812  GLUCAP 88 101* 77 87 124*   D-Dimer No results for input(s): "DDIMER" in the last 72 hours. Hgb A1c No results for input(s): "HGBA1C" in the last 72 hours. Lipid Profile No results for input(s):  "CHOL", "HDL", "LDLCALC", "TRIG", "CHOLHDL", "LDLDIRECT" in the last 72 hours. Thyroid function studies No results for input(s): "TSH", "T4TOTAL", "T3FREE", "THYROIDAB" in the last 72 hours.  Invalid input(s): "FREET3" Anemia work up No results for input(s): "VITAMINB12", "FOLATE", "FERRITIN", "TIBC", "IRON", "RETICCTPCT" in the last 72 hours. Urinalysis    Component Value Date/Time   COLORURINE YELLOW 09/16/2020 1256   APPEARANCEUR CLOUDY (A) 09/16/2020 1256   LABSPEC 1.011 09/16/2020 1256   PHURINE 6.0 09/16/2020 1256   GLUCOSEU NEGATIVE 09/16/2020 1256   GLUCOSEU NEGATIVE 07/30/2011 1440   HGBUR SMALL (A) 09/16/2020 1256   BILIRUBINUR NEGATIVE 09/16/2020 1256   KETONESUR NEGATIVE 09/16/2020 1256   PROTEINUR 100 (A) 09/16/2020 1256   UROBILINOGEN 0.2 09/30/2011 1824   NITRITE NEGATIVE 09/16/2020 1256   LEUKOCYTESUR LARGE (A) 09/16/2020 1256   Sepsis Labs Recent Labs  Lab 09/09/23 0236 09/10/23 0228 09/11/23 2055 09/12/23 1033  WBC 6.8 7.9 4.9 6.8   Microbiology Recent Results (from the past 240 hours)  Resp panel by RT-PCR (RSV, Flu A&B, Covid) Anterior Nasal Swab     Status: None   Collection Time: 09/07/23  9:34 PM   Specimen: Anterior Nasal Swab  Result Value Ref Range Status   SARS Coronavirus 2 by RT PCR NEGATIVE NEGATIVE Final   Influenza A by PCR NEGATIVE NEGATIVE Final   Influenza B by PCR NEGATIVE NEGATIVE Final    Comment: (NOTE) The Xpert Xpress SARS-CoV-2/FLU/RSV plus assay is intended as an aid in the diagnosis of influenza from Nasopharyngeal swab specimens and should not be used as a sole basis for treatment. Nasal washings and aspirates are unacceptable for Xpert Xpress SARS-CoV-2/FLU/RSV testing.  Fact Sheet for Patients: BloggerCourse.com  Fact Sheet for Healthcare Providers: SeriousBroker.it  This test is not yet approved or cleared by the [place] FDA and has been authorized for  detection and/or diagnosis of SARS-CoV-2 by FDA under an Emergency Use Authorization (EUA). This EUA will remain in effect (meaning this test can be used) for the duration of the COVID-19 declaration under Section 564(b)(1) of the Act, 21 U.S.C. section 360bbb-3(b)(1), unless the authorization is terminated or revoked.     Resp Syncytial Virus by PCR NEGATIVE NEGATIVE Final    Comment: (NOTE) Fact Sheet for Patients: BloggerCourse.com  Fact Sheet for Healthcare Providers: SeriousBroker.it  This test is not yet approved or cleared by the [place] FDA and has been authorized for detection and/or diagnosis of SARS-CoV-2 by FDA under an Emergency Use Authorization (EUA). This EUA will remain in effect (meaning this test can be used) for the duration of the COVID-19 declaration under Section 564(b)(1) of the Act, 21 U.S.C. section 360bbb-3(b)(1), unless the authorization is terminated or revoked.  Performed at [facility], [address] [place], [place] [address]   MRSA Next Gen by PCR, Nasal     Status: None   Collection Time: 09/08/23  2:15 PM   Specimen: Nasal Mucosa; Nasal Swab  Result Value  Ref Range Status   MRSA by PCR Next Gen NOT DETECTED NOT DETECTED Final    Comment: (NOTE) The GeneXpert MRSA Assay (FDA approved for NASAL specimens only), is one component of a comprehensive MRSA colonization surveillance program. It is not intended to diagnose MRSA infection nor to guide or monitor treatment for MRSA infections. Test performance is not FDA approved in patients less than 44 years old. Performed at [facility], [address], [place], [place] [address]   Respiratory (~20 pathogens) panel by PCR     Status: Abnormal   Collection Time: 09/11/23 10:27 PM   Specimen: Nasopharyngeal Swab; Respiratory  Result Value Ref Range Status   Adenovirus NOT DETECTED NOT DETECTED Final   Coronavirus 229E NOT  DETECTED NOT DETECTED Final    Comment: (NOTE) The Coronavirus on the Respiratory Panel, DOES NOT test for the novel  Coronavirus (2019 nCoV)    Coronavirus HKU1 NOT DETECTED NOT DETECTED Final   Coronavirus NL63 NOT DETECTED NOT DETECTED Final   Coronavirus OC43 NOT DETECTED NOT DETECTED Final   Metapneumovirus NOT DETECTED NOT DETECTED Final   Rhinovirus / Enterovirus NOT DETECTED NOT DETECTED Final   Influenza A H3 DETECTED (A) NOT DETECTED Final   Influenza B NOT DETECTED NOT DETECTED Final   Parainfluenza Virus 1 NOT DETECTED NOT DETECTED Final   Parainfluenza Virus 2 NOT DETECTED NOT DETECTED Final   Parainfluenza Virus 3 NOT DETECTED NOT DETECTED Final   Parainfluenza Virus 4 NOT DETECTED NOT DETECTED Final   Respiratory Syncytial Virus NOT DETECTED NOT DETECTED Final   Bordetella pertussis NOT DETECTED NOT DETECTED Final   Bordetella Parapertussis NOT DETECTED NOT DETECTED Final   Chlamydophila pneumoniae NOT DETECTED NOT DETECTED Final   Mycoplasma pneumoniae NOT DETECTED NOT DETECTED Final    Comment: Performed at [facility], [address] [place], [place] [address]     Time coordinating discharge: Over 30 minutes  SIGNED:   [name], MD  [facility] 09/13/2023, 12:21 PM Pager   If 7PM-7AM, please contact night-coverage

## 2023-09-13 NOTE — TOC Transition Note (Signed)
Transition of Care [facility]) - Discharge Note   Patient Details  Name: [name] MRN: [id number] Date of Birth: 1951-06-28  Transition of [facility]) CM/SW Contact:  Ronny Bacon, RN Phone Number: 09/13/2023, 11:00 AM   Clinical Narrative:   Patient is being discharged today, Kandee Keen with [facility] made aware.    Final next level of care: Home w Home Health Services Barriers to Discharge: No Barriers Identified   Patient Goals and CMS Choice Patient states their goals for this hospitalization and ongoing recovery are:: return home CMS Medicare.gov Compare Post Acute Care list provided to:: Patient Choice offered to / list presented to : Patient      Discharge Placement                       Discharge Plan and Services Additional resources added to the After Visit Summary for     Discharge Planning Services: CM Consult Post Acute Care Choice: Home Health          DME Arranged: Walker rolling, Bedside commode DME Agency: [employer] Date DME Agency Contacted: 09/11/23 Time DME Agency Contacted: 1521 Representative spoke with at DME Agency: Vaughan Basta HH Arranged: PT, OT HH Agency: [facility] Date [facility] Agency Contacted: 09/11/23 Time HH Agency Contacted: 1521 Representative spoke with at [facility] Agency: Kandee Keen  Social Drivers of Health (SDOH) Interventions SDOH Screenings   Food Insecurity: Patient Declined (09/08/2023)  Housing: Patient Declined (09/10/2023)  Transportation Needs: Patient Declined (09/08/2023)  Utilities: Patient Declined (09/08/2023)  Depression (PHQ2-9): Low Risk  (11/25/2022)  Social Connections: Patient Declined (09/08/2023)  Tobacco Use: Low Risk  (09/08/2023)     Readmission Risk Interventions     No data to display

## 2023-09-13 NOTE — Discharge Instructions (Signed)
Advised to follow-up with nephrology for continuation of hemodialysis. Advised to continue Tamiflu during hemodialysis for 3 more doses to complete 5-day treatment. Advised to take Keppra 500 mg daily for seizures. Advised to take aspirin 81 mg daily for carotid stenosis. Advised to take clonazepam 02 mg ODT for seizures more than 2 minutes.

## 2023-09-14 NOTE — TOC Transition Note (Signed)
Transition of Care - Initial Contact from Inpatient Facility  Date of discharge: 09/13/23 Date of contact: 09/14/23 Method: Attempted Phone Call Spoke to: No Answer  Contacted patient to discuss transition of care from recent inpatient hospitalization but he didn't answer the phone. Unable to leave a voicemail.   Patient to return to his  outpatient HD unit today at [facility].  Salome Holmes, NP

## 2023-09-14 NOTE — Progress Notes (Signed)
Late Note Entry- Sep 14, 2023  Pt was d/c yesterday. Contacted [name] this morning to be advised of pt's d/c date and that pt should resume care today.   Olivia Canter Renal Navigator [phone]

## 2023-09-15 ENCOUNTER — Telehealth: Payer: Self-pay

## 2023-09-15 ENCOUNTER — Encounter: Payer: Self-pay | Admitting: Family Medicine

## 2023-09-15 ENCOUNTER — Ambulatory Visit (INDEPENDENT_AMBULATORY_CARE_PROVIDER_SITE_OTHER): Payer: Medicare Other | Admitting: Family Medicine

## 2023-09-15 VITALS — BP 120/70 | HR 65 | Temp 98.5°F | Ht 71.0 in | Wt 155.0 lb

## 2023-09-15 DIAGNOSIS — Z7984 Long term (current) use of oral hypoglycemic drugs: Secondary | ICD-10-CM

## 2023-09-15 DIAGNOSIS — Z79899 Other long term (current) drug therapy: Secondary | ICD-10-CM | POA: Diagnosis not present

## 2023-09-15 DIAGNOSIS — D84821 Immunodeficiency due to drugs: Secondary | ICD-10-CM

## 2023-09-15 DIAGNOSIS — I509 Heart failure, unspecified: Secondary | ICD-10-CM

## 2023-09-15 DIAGNOSIS — E118 Type 2 diabetes mellitus with unspecified complications: Secondary | ICD-10-CM | POA: Diagnosis not present

## 2023-09-15 DIAGNOSIS — I132 Hypertensive heart and chronic kidney disease with heart failure and with stage 5 chronic kidney disease, or end stage renal disease: Secondary | ICD-10-CM

## 2023-09-15 DIAGNOSIS — N186 End stage renal disease: Secondary | ICD-10-CM | POA: Diagnosis not present

## 2023-09-15 DIAGNOSIS — Z940 Kidney transplant status: Secondary | ICD-10-CM

## 2023-09-15 DIAGNOSIS — N2581 Secondary hyperparathyroidism of renal origin: Secondary | ICD-10-CM | POA: Insufficient documentation

## 2023-09-15 DIAGNOSIS — I10 Essential (primary) hypertension: Secondary | ICD-10-CM

## 2023-09-15 DIAGNOSIS — Z992 Dependence on renal dialysis: Secondary | ICD-10-CM | POA: Insufficient documentation

## 2023-09-15 LAB — COMPREHENSIVE METABOLIC PANEL
ALT: 84 U/L — ABNORMAL HIGH (ref 0–53)
AST: 30 U/L (ref 0–37)
Albumin: 3.2 g/dL — ABNORMAL LOW (ref 3.5–5.2)
Alkaline Phosphatase: 96 U/L (ref 39–117)
BUN: 42 mg/dL — ABNORMAL HIGH (ref 6–23)
CO2: 32 meq/L (ref 19–32)
Calcium: 8.2 mg/dL — ABNORMAL LOW (ref 8.4–10.5)
Chloride: 91 meq/L — ABNORMAL LOW (ref 96–112)
Creatinine, Ser: 7.87 mg/dL (ref 0.40–1.50)
GFR: 6.41 mL/min — CL (ref >60.00)
Glucose, Bld: 77 mg/dL (ref 70–99)
Potassium: 4 meq/L (ref 3.5–5.1)
Sodium: 135 meq/L (ref 135–145)
Total Bilirubin: 0.5 mg/dL (ref 0.2–1.2)
Total Protein: 6 g/dL (ref 6.0–8.3)

## 2023-09-15 LAB — CBC WITH DIFFERENTIAL/PLATELET
Basophils Absolute: 0 10*3/uL (ref 0.0–0.1)
Basophils Relative: 0.9 % (ref 0.0–3.0)
Eosinophils Absolute: 0 10*3/uL (ref 0.0–0.7)
Eosinophils Relative: 1.2 % (ref 0.0–5.0)
HCT: 36.3 % — ABNORMAL LOW (ref 39.0–52.0)
Hemoglobin: 11.6 g/dL — ABNORMAL LOW (ref 13.0–17.0)
Lymphocytes Relative: 15.4 % (ref 12.0–46.0)
Lymphs Abs: 0.5 10*3/uL — ABNORMAL LOW (ref 0.7–4.0)
MCHC: 31.9 g/dL (ref 30.0–36.0)
MCV: 81.8 fL (ref 78.0–100.0)
Monocytes Absolute: 0.6 10*3/uL (ref 0.1–1.0)
Monocytes Relative: 17.6 % — ABNORMAL HIGH (ref 3.0–12.0)
Neutro Abs: 2.2 10*3/uL (ref 1.4–7.7)
Neutrophils Relative %: 64.9 % (ref 43.0–77.0)
Platelets: 97 10*3/uL — ABNORMAL LOW (ref 150.0–400.0)
RBC: 4.44 Mil/uL (ref 4.22–5.81)
RDW: 20.2 % — ABNORMAL HIGH (ref 11.5–15.5)
WBC: 3.4 10*3/uL — ABNORMAL LOW (ref 4.0–10.5)

## 2023-09-15 LAB — TSH: TSH: 3.01 u[IU]/mL (ref 0.35–5.50)

## 2023-09-15 LAB — MAGNESIUM: Magnesium: 2.2 mg/dL (ref 1.5–2.5)

## 2023-09-15 LAB — HEMOGLOBIN A1C: Hgb A1c MFr Bld: 5.2 % (ref 4.6–6.5)

## 2023-09-15 NOTE — Telephone Encounter (Signed)
 CRITICAL VALUE STICKER  CRITICAL VALUE: Creatinine 7.87 Critical GFR 6.41  RECEIVER (on-site recipient of call): Payton Moder Stubbs-Barnette  DATE & TIME NOTIFIED: 09/15/2023 4:38 pm  MESSENGER (representative from lab):Ernst  MD NOTIFIED: [name] Molt MD  TIME OF NOTIFICATION: 4:41 pm  RESPONSE:  Make sure patient continues with dialysis

## 2023-09-15 NOTE — Telephone Encounter (Signed)
Called and spoke with patient and informed them of critical values. Also informed them to make sure to continue with dialysis.

## 2023-09-15 NOTE — TOC Progression Note (Signed)
Transition of Care - Initial Contact from Inpatient Facility  Date of discharge: 09/13/23 Date of contact: 09/15/23 Method: Phone (2nd attempt) Spoke to: Patient  Patient contacted to discuss transition of care from recent inpatient hospitalization. Patient was admitted to [facility] from 09/07/23-09/13/23 with discharge diagnosis of Influenza A, acute metabolic encephalopathy, and hyperkalemia.  The discharge medication list was reviewed. Patient understands the changes and has no concerns.   Patient will return to his outpatient HD unit on: 09/16/23 at [facility], NP

## 2023-09-15 NOTE — Patient Instructions (Signed)
 We are checking labs today, will be in contact with any results that require further attention  Follow-up with me for new or worsening symptoms.

## 2023-09-15 NOTE — Progress Notes (Signed)
 Established Patient Office Visit  Subjective   Patient ID: [name], male    DOB: Nov 25, 1950  Age: 73 y.o. MRN: [id number]  Chief Complaint  Patient presents with   Hospitalization Follow-up    Patient visited hospital 01/27-02/02. Patient has since received dialysis and has been taking medications prescribed except for their cinacalcet  HCL (pharmacy is requiring a call from the provider prior to prescribing out the medication).    HPI Follow up Hospitalization  Patient was admitted to [facility] hospital on 09/07/23 and discharged on 09/13/23. He is accompanied by his wife today as the historian. He was treated for encephalopathy secondary to uremia. Had not been to dialysis in 11 days prior. Had an episode of somnolence at dialysis and they sent him to the ER as a code stroke.  Stroke workup was negative.  He was also diagnosed with a seizure disorder while he was in the hospital, started on Keppra . Reports compliance with this. Also given prn clonazepam . He reports good compliance with treatment. He reports this condition is improved.  Was dx with Flu A on 09/13/23. States compliance with tamiflu . Has 2 days left of course.  Reports that he is feeling much better.  Reports that he did attend dialysis yesterday and is scheduled to go again tomorrow.  Denies other concerns today. Requesting refill of cinacalcet  30mg  M,W,F. Do not see recent PTH, will get one today.  ----------------------------------------------------------------------------------------- -   ROS Per HPI    Objective:     BP 120/70   Pulse 65   Temp 98.5 F (36.9 C)   Ht 5' 11 (1.803 m)   Wt 155 lb (70.3 kg)   SpO2 99%   BMI 21.62 kg/m   Physical Exam Vitals and nursing note reviewed.  Constitutional:      General: He is not in acute distress.    Appearance: He is ill-appearing.     Comments: Chronically ill appearing  HENT:     Head: Normocephalic and atraumatic.     Nose: Congestion  present.     Mouth/Throat:     Mouth: Mucous membranes are moist.     Pharynx: Oropharynx is clear.  Eyes:     Extraocular Movements: Extraocular movements intact.  Cardiovascular:     Rate and Rhythm: Normal rate and regular rhythm.     Pulses: Normal pulses.     Heart sounds: Normal heart sounds.  Pulmonary:     Effort: Pulmonary effort is normal. No respiratory distress.     Breath sounds: Examination of the right-lower field reveals decreased breath sounds. Examination of the left-lower field reveals decreased breath sounds. Decreased breath sounds present. No wheezing, rhonchi or rales.  Musculoskeletal:     Cervical back: Normal range of motion.     Comments: Using cane to ambulate  Lymphadenopathy:     Cervical: No cervical adenopathy.  Skin:    General: Skin is warm and dry.  Neurological:     General: No focal deficit present.     Mental Status: He is alert and oriented to person, place, and time.     Motor: Weakness (generalized muscle weakness) present.  Psychiatric:        Mood and Affect: Mood normal.        Behavior: Behavior normal.      No results found for any visits on 09/15/23.   The ASCVD Risk score (Arnett DK, et al., 2019) failed to calculate for the following reasons:   Cannot find a  previous HDL lab   Cannot find a previous total cholesterol lab    Assessment & Plan:   Type 2 diabetes mellitus with complication, without long-term current use of insulin  (HCC) -     CBC with Differential/Platelet -     Comprehensive metabolic panel -     Hemoglobin A1c  End stage renal disease (HCC) -     CBC with Differential/Platelet -     Comprehensive metabolic panel -     TSH -     PTH, intact and calcium  -     Magnesium  Immunosuppression due to drug therapy (HCC) -     CBC with Differential/Platelet -     Comprehensive metabolic panel -     TSH -     PTH, intact and calcium  -     Magnesium  Congestive heart failure, unspecified HF chronicity,  unspecified heart failure type (HCC) -     CBC with Differential/Platelet -     Comprehensive metabolic panel  Primary hypertension -     CBC with Differential/Platelet -     Comprehensive metabolic panel  Kidney transplant status, cadaveric -     CBC with Differential/Platelet -     Comprehensive metabolic panel -     PTH, intact and calcium   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    Corean Ku, FNP

## 2023-09-16 ENCOUNTER — Encounter: Payer: Self-pay | Admitting: Family Medicine

## 2023-09-16 ENCOUNTER — Telehealth: Payer: Self-pay | Admitting: Pharmacy Technician

## 2023-09-16 ENCOUNTER — Other Ambulatory Visit (HOSPITAL_COMMUNITY): Payer: Self-pay

## 2023-09-16 LAB — PTH, INTACT AND CALCIUM
Calcium: 8.2 mg/dL — ABNORMAL LOW (ref 8.6–10.3)
PTH: 358 pg/mL — ABNORMAL HIGH (ref 16–77)

## 2023-09-16 MED ORDER — CINACALCET HCL 30 MG PO TABS: 30.0000 mg | ORAL_TABLET | ORAL | 3 refills | Status: AC

## 2023-09-16 NOTE — Telephone Encounter (Signed)
 Pharmacy Patient Advocate Encounter  Received notification from [facility] that Prior Authorization for Cinacalcet  HCl 30MG  tablets  has been DENIED.  Full denial letter will be uploaded to the media tab. See denial reason below.     PA #/Case ID/Reference #: [id number]

## 2023-09-16 NOTE — Telephone Encounter (Signed)
 Pharmacy Patient Advocate Encounter   Received notification from CoverMyMeds that prior authorization for Cinacalcet  HCl 30MG  tablets is required/requested.   Insurance verification completed.   The patient is insured through [facility] .   Per test claim: PA required; PA submitted to above mentioned insurance via CoverMyMeds Key/confirmation #/EOC [id number] Status is pending

## 2023-09-16 NOTE — Addendum Note (Signed)
 Addended by: Wellington Half on: 09/16/2023 09:53 AM   Modules accepted: Orders

## 2023-09-17 ENCOUNTER — Telehealth: Payer: Self-pay

## 2023-09-17 NOTE — Telephone Encounter (Signed)
 Duplicate request, routed to provider prior

## 2023-09-17 NOTE — Telephone Encounter (Signed)
 Copied from CRM [id number]. Topic: Clinical - Home Health Verbal Orders >> Sep 17, 2023  1:10 PM Rosina BIRCH wrote:  Caller/Agency:sree from [facility] Callback Number: [phone] Service Requested: Skilled Nursing, child psychotherapist, home health aid and transfer bench Frequency: physical therapy-one time a week for nine weeks, home health aid-one time for four weeks Any new concerns about the patient? Yes the patient is drowsy

## 2023-09-17 NOTE — Telephone Encounter (Signed)
 Copied from CRM [id number]. Topic: Clinical - Prescription Issue >> Sep 17, 2023 12:45 PM Deaijah H wrote:  Reason for CRM: Sree Therapist [facility] still very weak certain medication he does not at home losartan  (COZAAR ) 25 MG tablet / oseltamivir  (TAMIFLU ) 30 MG capsule / calcitRIOL  (ROCALTROL ) 0.5 MCG capsule / cinacalcet  (SENSIPAR ) 30 MG table and would like to add a nurse / if needed, please call [phone]

## 2023-09-17 NOTE — Telephone Encounter (Signed)
 Patient recently had an appointment, please advise. Virtual if needed to discuss medications?

## 2023-09-17 NOTE — Telephone Encounter (Signed)
 Spoke with Sree from [facility] in regards to patient, he states patient is still extremely drowsy, he has still been driving, very lethargic, and experiencing headaches. He is requesting orders for a nurse, child psychotherapist, home health aide, and a transfer bed through a DME supply company. Medications listed below are medications he has questions about, some of them patient does not have or has incorrect doses. Tylenol  500mg , OTC states patient needs a script Hydralazine  100mg  at home, but the medication list they have says he should be taking 25mg  of this medication Losartan , needs refilled Cinacalet, needs refilled Perocet 5-325mg , needs refilled

## 2023-09-18 ENCOUNTER — Encounter (HOSPITAL_COMMUNITY): Payer: Self-pay | Admitting: Internal Medicine

## 2023-09-18 ENCOUNTER — Emergency Department (HOSPITAL_COMMUNITY): Payer: Medicare Other

## 2023-09-18 ENCOUNTER — Inpatient Hospital Stay (HOSPITAL_COMMUNITY): Payer: Medicare Other

## 2023-09-18 ENCOUNTER — Inpatient Hospital Stay (HOSPITAL_COMMUNITY)
Admission: EM | Admit: 2023-09-18 | Discharge: 2023-09-24 | DRG: 871 | Disposition: A | Discharge status: Home Health | Payer: Medicare Other | Attending: Internal Medicine | Admitting: Internal Medicine

## 2023-09-18 DIAGNOSIS — B957 Other staphylococcus as the cause of diseases classified elsewhere: Secondary | ICD-10-CM | POA: Diagnosis present

## 2023-09-18 DIAGNOSIS — E785 Hyperlipidemia, unspecified: Secondary | ICD-10-CM | POA: Diagnosis present

## 2023-09-18 DIAGNOSIS — D631 Anemia in chronic kidney disease: Secondary | ICD-10-CM | POA: Diagnosis present

## 2023-09-18 DIAGNOSIS — R0603 Acute respiratory distress: Secondary | ICD-10-CM | POA: Diagnosis not present

## 2023-09-18 DIAGNOSIS — J101 Influenza due to other identified influenza virus with other respiratory manifestations: Secondary | ICD-10-CM | POA: Diagnosis present

## 2023-09-18 DIAGNOSIS — E1122 Type 2 diabetes mellitus with diabetic chronic kidney disease: Secondary | ICD-10-CM | POA: Diagnosis present

## 2023-09-18 DIAGNOSIS — N2581 Secondary hyperparathyroidism of renal origin: Secondary | ICD-10-CM | POA: Diagnosis present

## 2023-09-18 DIAGNOSIS — Z1152 Encounter for screening for COVID-19: Secondary | ICD-10-CM

## 2023-09-18 DIAGNOSIS — J111 Influenza due to unidentified influenza virus with other respiratory manifestations: Principal | ICD-10-CM

## 2023-09-18 DIAGNOSIS — F17200 Nicotine dependence, unspecified, uncomplicated: Secondary | ICD-10-CM | POA: Diagnosis present

## 2023-09-18 DIAGNOSIS — I10 Essential (primary) hypertension: Secondary | ICD-10-CM | POA: Diagnosis present

## 2023-09-18 DIAGNOSIS — I330 Acute and subacute infective endocarditis: Secondary | ICD-10-CM | POA: Diagnosis present

## 2023-09-18 DIAGNOSIS — I5042 Chronic combined systolic (congestive) and diastolic (congestive) heart failure: Secondary | ICD-10-CM | POA: Diagnosis present

## 2023-09-18 DIAGNOSIS — J9601 Acute respiratory failure with hypoxia: Secondary | ICD-10-CM | POA: Diagnosis present

## 2023-09-18 DIAGNOSIS — D6959 Other secondary thrombocytopenia: Secondary | ICD-10-CM | POA: Diagnosis present

## 2023-09-18 DIAGNOSIS — G9341 Metabolic encephalopathy: Secondary | ICD-10-CM | POA: Diagnosis present

## 2023-09-18 DIAGNOSIS — N186 End stage renal disease: Secondary | ICD-10-CM | POA: Diagnosis present

## 2023-09-18 DIAGNOSIS — M109 Gout, unspecified: Secondary | ICD-10-CM | POA: Diagnosis present

## 2023-09-18 DIAGNOSIS — Z862 Personal history of diseases of the blood and blood-forming organs and certain disorders involving the immune mechanism: Secondary | ICD-10-CM | POA: Diagnosis not present

## 2023-09-18 DIAGNOSIS — A4189 Other specified sepsis: Secondary | ICD-10-CM | POA: Diagnosis present

## 2023-09-18 DIAGNOSIS — I081 Rheumatic disorders of both mitral and tricuspid valves: Secondary | ICD-10-CM | POA: Diagnosis present

## 2023-09-18 DIAGNOSIS — Z992 Dependence on renal dialysis: Secondary | ICD-10-CM

## 2023-09-18 DIAGNOSIS — E873 Alkalosis: Secondary | ICD-10-CM | POA: Diagnosis present

## 2023-09-18 DIAGNOSIS — I5032 Chronic diastolic (congestive) heart failure: Secondary | ICD-10-CM | POA: Diagnosis not present

## 2023-09-18 DIAGNOSIS — E11649 Type 2 diabetes mellitus with hypoglycemia without coma: Secondary | ICD-10-CM | POA: Diagnosis present

## 2023-09-18 DIAGNOSIS — R6883 Chills (without fever): Secondary | ICD-10-CM

## 2023-09-18 DIAGNOSIS — Z7982 Long term (current) use of aspirin: Secondary | ICD-10-CM

## 2023-09-18 DIAGNOSIS — K5900 Constipation, unspecified: Secondary | ICD-10-CM | POA: Diagnosis present

## 2023-09-18 DIAGNOSIS — Z833 Family history of diabetes mellitus: Secondary | ICD-10-CM

## 2023-09-18 DIAGNOSIS — A419 Sepsis, unspecified organism: Secondary | ICD-10-CM | POA: Diagnosis not present

## 2023-09-18 DIAGNOSIS — N189 Chronic kidney disease, unspecified: Secondary | ICD-10-CM | POA: Diagnosis not present

## 2023-09-18 DIAGNOSIS — I132 Hypertensive heart and chronic kidney disease with heart failure and with stage 5 chronic kidney disease, or end stage renal disease: Secondary | ICD-10-CM | POA: Diagnosis present

## 2023-09-18 DIAGNOSIS — G40909 Epilepsy, unspecified, not intractable, without status epilepticus: Secondary | ICD-10-CM | POA: Diagnosis present

## 2023-09-18 DIAGNOSIS — R7881 Bacteremia: Secondary | ICD-10-CM | POA: Diagnosis not present

## 2023-09-18 DIAGNOSIS — R509 Fever, unspecified: Secondary | ICD-10-CM

## 2023-09-18 DIAGNOSIS — Z87898 Personal history of other specified conditions: Secondary | ICD-10-CM | POA: Diagnosis not present

## 2023-09-18 DIAGNOSIS — E162 Hypoglycemia, unspecified: Secondary | ICD-10-CM | POA: Diagnosis not present

## 2023-09-18 DIAGNOSIS — E119 Type 2 diabetes mellitus without complications: Secondary | ICD-10-CM | POA: Diagnosis not present

## 2023-09-18 DIAGNOSIS — R6520 Severe sepsis without septic shock: Secondary | ICD-10-CM | POA: Diagnosis present

## 2023-09-18 DIAGNOSIS — Z860100 Personal history of colon polyps, unspecified: Secondary | ICD-10-CM

## 2023-09-18 DIAGNOSIS — Z79899 Other long term (current) drug therapy: Secondary | ICD-10-CM

## 2023-09-18 DIAGNOSIS — I340 Nonrheumatic mitral (valve) insufficiency: Secondary | ICD-10-CM | POA: Diagnosis not present

## 2023-09-18 DIAGNOSIS — R54 Age-related physical debility: Secondary | ICD-10-CM | POA: Diagnosis present

## 2023-09-18 DIAGNOSIS — I509 Heart failure, unspecified: Secondary | ICD-10-CM | POA: Diagnosis not present

## 2023-09-18 HISTORY — DX: Unspecified convulsions: R56.9

## 2023-09-18 HISTORY — DX: End stage renal disease: N18.6

## 2023-09-18 LAB — COMPREHENSIVE METABOLIC PANEL
ALT: 46 U/L — ABNORMAL HIGH (ref 0–44)
AST: 23 U/L (ref 15–41)
Albumin: 2.4 g/dL — ABNORMAL LOW (ref 3.5–5.0)
Alkaline Phosphatase: 95 U/L (ref 38–126)
Anion gap: 14 (ref 5–15)
BUN: 14 mg/dL (ref 8–23)
CO2: 30 mmol/L (ref 22–32)
Calcium: 8.4 mg/dL — ABNORMAL LOW (ref 8.9–10.3)
Chloride: 93 mmol/L — ABNORMAL LOW (ref 98–111)
Creatinine, Ser: 4.6 mg/dL — ABNORMAL HIGH (ref 0.61–1.24)
GFR, Estimated: 13 mL/min — ABNORMAL LOW (ref >60)
Glucose, Bld: 92 mg/dL (ref 70–99)
Potassium: 3.9 mmol/L (ref 3.5–5.1)
Sodium: 137 mmol/L (ref 135–145)
Total Bilirubin: 1 mg/dL (ref 0.0–1.2)
Total Protein: 6.4 g/dL — ABNORMAL LOW (ref 6.5–8.1)

## 2023-09-18 LAB — I-STAT VENOUS BLOOD GAS, ED
Acid-Base Excess: 12 mmol/L — ABNORMAL HIGH (ref 0.0–2.0)
Bicarbonate: 36.5 mmol/L — ABNORMAL HIGH (ref 20.0–28.0)
Calcium, Ion: 1.03 mmol/L — ABNORMAL LOW (ref 1.15–1.40)
HCT: 38 % — ABNORMAL LOW (ref 39.0–52.0)
Hemoglobin: 12.9 g/dL — ABNORMAL LOW (ref 13.0–17.0)
O2 Saturation: 78 %
Patient temperature: 37
Potassium: 3.6 mmol/L (ref 3.5–5.1)
Sodium: 135 mmol/L (ref 135–145)
TCO2: 38 mmol/L — ABNORMAL HIGH (ref 22–32)
pCO2, Ven: 43.9 mm[Hg] — ABNORMAL LOW (ref 44–60)
pH, Ven: 7.528 — ABNORMAL HIGH (ref 7.25–7.43)
pO2, Ven: 39 mm[Hg] (ref 32–45)

## 2023-09-18 LAB — CBC WITH DIFFERENTIAL/PLATELET
Abs Immature Granulocytes: 0.12 10*3/uL — ABNORMAL HIGH (ref 0.00–0.07)
Basophils Absolute: 0 10*3/uL (ref 0.0–0.1)
Basophils Relative: 0 %
Eosinophils Absolute: 0 10*3/uL (ref 0.0–0.5)
Eosinophils Relative: 0 %
HCT: 37.5 % — ABNORMAL LOW (ref 39.0–52.0)
Hemoglobin: 11.4 g/dL — ABNORMAL LOW (ref 13.0–17.0)
Immature Granulocytes: 1 %
Lymphocytes Relative: 5 %
Lymphs Abs: 0.5 10*3/uL — ABNORMAL LOW (ref 0.7–4.0)
MCH: 25.7 pg — ABNORMAL LOW (ref 26.0–34.0)
MCHC: 30.4 g/dL (ref 30.0–36.0)
MCV: 84.5 fL (ref 80.0–100.0)
Monocytes Absolute: 0.5 10*3/uL (ref 0.1–1.0)
Monocytes Relative: 5 %
Neutro Abs: 9.3 10*3/uL — ABNORMAL HIGH (ref 1.7–7.7)
Neutrophils Relative %: 89 %
Platelets: 123 10*3/uL — ABNORMAL LOW (ref 150–400)
RBC: 4.44 MIL/uL (ref 4.22–5.81)
RDW: 19 % — ABNORMAL HIGH (ref 11.5–15.5)
WBC: 10.5 10*3/uL (ref 4.0–10.5)
nRBC: 0 % (ref 0.0–0.2)

## 2023-09-18 LAB — MAGNESIUM: Magnesium: 2 mg/dL (ref 1.7–2.4)

## 2023-09-18 LAB — RESP PANEL BY RT-PCR (RSV, FLU A&B, COVID)  RVPGX2
Influenza A by PCR: POSITIVE — AB
Influenza B by PCR: NEGATIVE
Resp Syncytial Virus by PCR: NEGATIVE
SARS Coronavirus 2 by RT PCR: NEGATIVE

## 2023-09-18 LAB — PROTIME-INR
INR: 1.2 (ref 0.8–1.2)
Prothrombin Time: 15.1 s (ref 11.4–15.2)

## 2023-09-18 LAB — APTT: aPTT: 31 s (ref 24–36)

## 2023-09-18 LAB — BRAIN NATRIURETIC PEPTIDE: B Natriuretic Peptide: >4500 pg/mL — ABNORMAL HIGH (ref 0.0–100.0)

## 2023-09-18 LAB — CBG MONITORING, ED: Glucose-Capillary: 64 mg/dL — ABNORMAL LOW (ref 70–99)

## 2023-09-18 LAB — I-STAT CG4 LACTIC ACID, ED: Lactic Acid, Venous: 1.3 mmol/L (ref 0.5–1.9)

## 2023-09-18 LAB — PROCALCITONIN: Procalcitonin: 6.45 ng/mL

## 2023-09-18 MED ORDER — LOSARTAN POTASSIUM 50 MG PO TABS
25.0000 mg | ORAL_TABLET | Freq: Two times a day (BID) | ORAL | Status: DC
  Administered 2023-09-19 – 2023-09-20 (×3): 25 mg via ORAL
  Filled 2023-09-18 (×3): qty 1

## 2023-09-18 MED ORDER — HYDRALAZINE HCL 25 MG PO TABS
25.0000 mg | ORAL_TABLET | Freq: Two times a day (BID) | ORAL | Status: DC
  Administered 2023-09-19 – 2023-09-24 (×11): 25 mg via ORAL
  Filled 2023-09-18 (×11): qty 1

## 2023-09-18 MED ORDER — LEVETIRACETAM 500 MG PO TABS
500.0000 mg | ORAL_TABLET | Freq: Every day | ORAL | Status: DC
  Administered 2023-09-19 – 2023-09-24 (×6): 500 mg via ORAL
  Filled 2023-09-18 (×6): qty 1

## 2023-09-18 MED ORDER — DEXTROSE 50 % IV SOLN
1.0000 | INTRAVENOUS | Status: DC | PRN
  Administered 2023-09-23: 50 mL via INTRAVENOUS
  Filled 2023-09-18: qty 50

## 2023-09-18 MED ORDER — MELATONIN 3 MG PO TABS
3.0000 mg | ORAL_TABLET | Freq: Every evening | ORAL | Status: DC | PRN
  Administered 2023-09-21 – 2023-09-22 (×2): 3 mg via ORAL
  Filled 2023-09-18 (×2): qty 1

## 2023-09-18 MED ORDER — CLONAZEPAM 1 MG PO TBDP: 2.0000 mg | ORAL_TABLET | Freq: Every day | ORAL | Status: DC | PRN

## 2023-09-18 MED ORDER — GUAIFENESIN ER 600 MG PO TB12
600.0000 mg | ORAL_TABLET | Freq: Two times a day (BID) | ORAL | Status: DC
  Administered 2023-09-19 – 2023-09-24 (×11): 600 mg via ORAL
  Filled 2023-09-18 (×11): qty 1

## 2023-09-18 MED ORDER — ACETAMINOPHEN 325 MG PO TABS
650.0000 mg | ORAL_TABLET | Freq: Four times a day (QID) | ORAL | Status: DC | PRN
Start: 2023-09-18 — End: 2023-09-24
  Administered 2023-09-21 – 2023-09-24 (×4): 650 mg via ORAL
  Filled 2023-09-18 (×4): qty 2

## 2023-09-18 MED ORDER — ASPIRIN 81 MG PO CHEW
81.0000 mg | CHEWABLE_TABLET | Freq: Every day | ORAL | Status: DC
  Administered 2023-09-19 – 2023-09-24 (×6): 81 mg via ORAL
  Filled 2023-09-18 (×6): qty 1

## 2023-09-18 MED ORDER — ACETAMINOPHEN 325 MG PO TABS
650.0000 mg | ORAL_TABLET | Freq: Once | ORAL | Status: AC
  Administered 2023-09-18: 650 mg via ORAL
  Filled 2023-09-18: qty 2

## 2023-09-18 MED ORDER — INSULIN ASPART 100 UNIT/ML IJ SOLN: 0.0000 [IU] | Freq: Three times a day (TID) | INTRAMUSCULAR | Status: DC

## 2023-09-18 MED ORDER — SEVELAMER CARBONATE 800 MG PO TABS
1600.0000 mg | ORAL_TABLET | Freq: Three times a day (TID) | ORAL | Status: DC
  Administered 2023-09-19 – 2023-09-24 (×11): 1600 mg via ORAL
  Filled 2023-09-18 (×12): qty 2

## 2023-09-18 MED ORDER — ACETAMINOPHEN 650 MG RE SUPP: 650.0000 mg | Freq: Four times a day (QID) | RECTAL | Status: DC | PRN

## 2023-09-18 MED ORDER — ONDANSETRON HCL 4 MG/2ML IJ SOLN: 4.0000 mg | Freq: Four times a day (QID) | INTRAMUSCULAR | Status: DC | PRN

## 2023-09-18 MED ORDER — BENZONATATE 100 MG PO CAPS
200.0000 mg | ORAL_CAPSULE | Freq: Three times a day (TID) | ORAL | Status: DC | PRN
  Administered 2023-09-19 – 2023-09-23 (×2): 200 mg via ORAL
  Filled 2023-09-18 (×2): qty 2

## 2023-09-18 NOTE — ED Triage Notes (Signed)
 Pt from home with reports of tremors after his dialysis treatment today. Pt able to state his name. Denies pain. Pts O2 85% on RA. Pt placed on 2L. O2 now 92%.

## 2023-09-18 NOTE — Sepsis Progress Note (Addendum)
 Notified provider of need to order antibiotics.  Per provider, suspect viral cause as patient tested positive for influenza.

## 2023-09-18 NOTE — H&P (Addendum)
 History and Physical      VERBON GIANGREGORIO FMW:[id number] DOB: 03/13/1951 DOA: 09/18/2023; DOS: 09/18/2023  PCP: Joshua [name] CROME, MD  Patient coming from: home   I have personally briefly reviewed patient's old medical records in [facility]  Chief Complaint: fever  HPI: [name] is a 73 y.o. male with medical history significant for end-stage renal disease on hemodialysis on Monday, Wednesday, Friday schedule, type 2 diabetes mellitus, essential hypertension, seizures, chronic diastolic heart failure, who is admitted to [facility] on 09/18/2023 with influenza A infection after presenting from home to [facility] ED complaining of fever.   The following history is provided by the patient as well as his wife, in addition to my discussions with the EDP and via chart review.  Patient presents with complaint of 1 day of subjective fever, chills.  He was diagnosed with influenza A 5 days ago, and has now completed a total course of 5 days of Tamiflu .  Initially, his symptoms were limited to cough, generalized myalgias.  However, he has had persistence of his cough, generalized myalgias, now with subjective fever and chills.  No associated any chest pain or significant shortness of breath.  Patient's wife confirms that the patient has not recently been experiencing any abdominal pain or any recent nausea, vomiting, diarrhea.  He has a history of incisional disease on hemodialysis on Monday, Wednesday, Friday schedule, and was able to complete today's hemodialysis session.  However, following hemodialysis, his subjective fever, chills intensified, resulting in his presentation to [facility] emergency department for further evaluation management thereof.  Additionally, the patient was noted to be somnolent after today's hemodialysis session.  His wife conveys that this is typical for him following hemodialysis sessions, and that he typically requires a long nap after completing a  hemodialysis session.  He no longer produces urine.  His medical history is also notable for chronic diastolic heart failure, with most recent echocardiogram completed in February 2022, which showed LVEF 60 to 65% as well as grade 2 diastolic dysfunction.  He is a lifelong non-smoker, and is without any known chronic underlying pulmonary pathology.  No known baseline supplemental oxygen requirements.  Of note, he was hospitalized in the [facility] system from 09/07/2023 to 09/13/2023 for further evaluation management of acute encephalopathy.    ED Course:  Vital signs in the ED were notable for the following: Temperature max 100.8; heart rates in the 70s to 80s; systolic blood pressures in the 150s; respiratory rate 17-22, initial oxygen saturation 85% on room air, subsequently increasing to 97% on 3 L nasal cannula.  Labs were notable for the following: Initial VBG showed the following: 7.5-8/43 point 9/39/30 6.5.  CMP notable for the following: Sodium 137, potassium 3.9, bicarbonate 30, glucose 92, calcium  adjusted for mild hypoalbuminemia noted to be 9.6, avidin 2.4, ALT 46 compared to 84 on 09/15/2023.  Otherwise, liver enzymes are within normal limits.  BNP greater than 4500, compared to most recent prior BNP value of 1800 on February 2022.  Lactic acid 1.3.  CBC G64.  CBC notable for evidence of count 10,500, hemoglobin 11.4 associated Neuraceq properties and relative demonstrates a prior hemoglobin value of 11.6 on 09/15/2023.  INR 1.2, PTT 31.  Blood cultures x 2 were collected.  Influenza A PCR was confirmed to be positive.  COVID, influenza B, and RSV PCR were all negative.  Per my interpretation, EKG in ED demonstrated the following: Interpretation of today's EKG was limited by the  presence of artifact.  However, within these confines, and in comparison to most recent prior EKG from 09/08/2023, today's EKG appears to show sinus rhythm with heart rate 86, no evidence of T wave changes, less than 1  mm ST elevation in V3, which appears unchanged from most recent EKG, demonstrating evidence of interval ST changes.  Imaging in the ED, per corresponding formal radiology read, was notable for the following: 2 view chest x-ray shows cardiomegaly with low lung volumes and mild mid left lung and bibasilar atelectasis, with small bilateral pleural effusions, in the absence of overt evidence of infiltrate, edema, or pneumothorax.  While in the ED, the following were administered: Acetaminophen  650 mg p.o. x 1 dose.  Subsequently, the patient was admitted for further evaluation management of presenting influenza A infection complicated by severe sepsis, as well as acute hypoxic respiratory distress, with presentation also notable for suspected acute metabolic encephalopathy as well as hypoglycemia.    Review of Systems: As per HPI otherwise 10 point review of systems negative.   Past Medical History:  Diagnosis Date   Anemia    Anemia    Benign colon polyp 08/11/2010   Clot    STATED TOLD IN APRIL CLOT TO LEFT SHOULDER. 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   HTN (hypertension)    Hyperlipidemia    Renal failure     Past Surgical History:  Procedure Laterality Date   AV FISTULA PLACEMENT  01/19/2012   Procedure: ARTERIOVENOUS (AV) FISTULA CREATION;  Surgeon: Redell LITTIE Door, MD;  Location: [facility] OR;  Service: Vascular;  Laterality: Right;  Right Brachio-cephalic arteriovenous fistula   AV FISTULA PLACEMENT Right 07/18/2021   Procedure: INSERTION OF RIGHT UPPER EXTREMITY ARTERIOVENOUS (AV) GORE-TEX GRAFT;  Surgeon: Magda [name] SAILOR, MD;  Location: [facility] OR;  Service: Vascular;  Laterality: Right;  PERIPHERAL NERVE BLOCK   AV FISTULA PLACEMENT, RADIOCEPHALIC  12/31/10   Right arm   INSERTION OF DIALYSIS CATHETER  01/19/2012   Procedure: INSERTION OF DIALYSIS CATHETER;   Surgeon: Redell LITTIE Door, MD;  Location[facility] OR;  Service: Vascular;  Laterality: N/A;  right internal jugular vein   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   UPPER EXTREMITY VENOGRAPHY Right 06/11/2021   Procedure: UPPER EXTREMITY VENOGRAPHY;  Surgeon: Serene Gaile ORN, MD;  Location: [facility] INVASIVE CV LAB;  Service: Cardiovascular;  Laterality: Right;   VENOGRAM Left 05/30/2021   Procedure: DIAGNOSTIC CENTRAL VENOGRAM;  Surgeon: Lanis Fonda BRAVO, MD;  Location: [facility] OR;  Service: Vascular;  Laterality: Left;    Social History:  reports that he has never smoked. He has never used smokeless tobacco. He reports that he does not drink alcohol and does not use drugs.   No Known Allergies  Family History  Problem Relation Age of Onset   Diabetes Father    Colon cancer Neg Hx    Esophageal cancer Neg Hx    Stomach cancer Neg Hx    Rectal cancer Neg Hx     Family history reviewed and not pertinent    Prior to Admission medications   Medication Sig Start Date End Date Taking? Authorizing Provider  acetaminophen  (TYLENOL ) 500 MG tablet Take 1,000 mg by mouth every 8 (eight) hours as needed for moderate pain.    [provider]  amLODipine  (NORVASC ) 10 MG tablet Take 10 mg by  mouth at bedtime. 06/30/23   [provider]  aspirin  81 MG chewable tablet Chew 1 tablet (81 mg total) by mouth daily. 09/14/23 10/14/23  Leotis Bogus, MD  calcitRIOL  (ROCALTROL ) 0.5 MCG capsule Take 0.5 mcg by mouth See admin instructions. Take one tablet by mouth three times a week on Monday, Wednesday and Fridays per wife 04/20/23   [provider]  carvedilol  (COREG ) 25 MG tablet Take 25 mg by mouth 2 (two) times daily with a meal.    [provider]  cinacalcet  (SENSIPAR ) 30 MG tablet Take 1 tablet (30 mg total) by mouth every Monday, Wednesday, and Friday. 09/16/23 10/16/23  Alvia Krabbe, FNP  clonazePAM  (KLONOPIN ) 2 MG disintegrating tablet Take 1 tablet (2 mg total) by mouth daily. 09/13/23    Leotis Bogus, MD  guaiFENesin  (MUCINEX ) 600 MG 12 hr tablet Take 1 tablet (600 mg total) by mouth 2 (two) times daily. 09/13/23 10/13/23  Leotis Bogus, MD  hydrALAZINE  (APRESOLINE ) 25 MG tablet Take 25 mg by mouth 2 (two) times daily. 09/01/22   [provider]  levETIRAcetam  (KEPPRA ) 500 MG tablet Take 1 tablet (500 mg total) by mouth daily. 09/13/23 10/13/23  Leotis Bogus, MD  losartan  (COZAAR ) 25 MG tablet Take 25 mg by mouth in the morning and at bedtime. 12/10/20   [provider]  oseltamivir  (TAMIFLU ) 30 MG capsule Take 1 capsule (30 mg total) by mouth every Monday, Wednesday, and Friday with hemodialysis for 3 doses. 09/14/23 09/19/23  Leotis Bogus, MD  senna-docusate (SENOKOT-S) 8.6-50 MG tablet Take 1 tablet by mouth 2 (two) times daily. 09/13/23 10/13/23  Leotis Bogus, MD  sevelamer  carbonate (RENVELA ) 800 MG tablet Take 1,600 mg by mouth 3 (three) times daily with meals. 04/01/21   [provider]     Objective    Physical Exam: Vitals:   09/18/23 1839 09/18/23 1859  BP: (!) 153/94   Pulse: 88   Resp: (!) 22   Temp:  (!) 100.8 F (38.2 C)  TempSrc:  Oral  SpO2: 90%     General: appears to be stated age; somnolent, Skin: warm, dry, no rash Head:  AT/[place] Mouth:  Oral mucosa membranes appear moist, normal dentition Neck: supple; trachea midline Heart:  RRR; did not appreciate any M/R/G Lungs: CTAB, did not appreciate any wheezes, rales, or rhonchi Abdomen: + BS; soft, ND, NT Vascular: 2+ pedal pulses b/l; 2+ radial pulses b/l Extremities: no peripheral edema, no muscle wasting Neuro: in the setting of the patient's current mental status and associated inability to follow instructions, unable to perform full neurologic exam at this time.  As such, assessment of strength, sensation, and cranial nerves is limited at this time. Patient noted to spontaneously move all 4 extremities. No tremors.      Labs on Admission: I have personally reviewed  following labs and imaging studies  CBC: Recent Labs  Lab 09/12/23 1033 09/15/23 1407 09/18/23 1922 09/18/23 1929  WBC 6.8 3.4*  --  10.5  NEUTROABS  --  2.2  --  9.3*  HGB 12.6* 11.6* 12.9* 11.4*  HCT 41.8 36.3* 38.0* 37.5*  MCV 86.2 81.8  --  84.5  PLT 58* 97.0*  --  123*   Basic Metabolic Panel: Recent Labs  Lab 09/12/23 1033 09/15/23 1407 09/18/23 1922 09/18/23 1929  NA 131* 135 135 137  K 5.1 4.0 3.6 3.9  CL 91* 91*  --  93*  CO2 24 32  --  30  GLUCOSE 105* 77  --  92  BUN 39* 42*  --  14  CREATININE 8.34* 7.87*  --  4.60*  CALCIUM  8.5* 8.2*  8.2*  --  8.4*  MG 2.3 2.2  --   --   PHOS 8.0*  --   --   --    GFR: Estimated Creatinine Clearance: 14.9 mL/min (A) (by C-G formula based on SCr of 4.6 mg/dL (H)). Liver Function Tests: Recent Labs  Lab 09/15/23 1407 09/18/23 1929  AST 30 23  ALT 84* 46*  ALKPHOS 96 95  BILITOT 0.5 1.0  PROT 6.0 6.4*  ALBUMIN 3.2* 2.4*   No results for input(s): LIPASE, AMYLASE in the last 168 hours. No results for input(s): AMMONIA in the last 168 hours. Coagulation Profile: Recent Labs  Lab 09/18/23 1929  INR 1.2   Cardiac Enzymes: No results for input(s): CKTOTAL, CKMB, CKMBINDEX, TROPONINI in the last 168 hours. BNP (last 3 results) No results for input(s): PROBNP in the last 8760 hours. HbA1C: No results for input(s): HGBA1C in the last 72 hours. CBG: Recent Labs  Lab 09/12/23 2045 09/12/23 2350 09/13/23 0428 09/13/23 0812 09/18/23 1834  GLUCAP 101* 77 87 124* 64*   Lipid Profile: No results for input(s): CHOL, HDL, LDLCALC, TRIG, CHOLHDL, LDLDIRECT in the last 72 hours. Thyroid  Function Tests: No results for input(s): TSH, T4TOTAL, FREET4, T3FREE, THYROIDAB in the last 72 hours. Anemia Panel: No results for input(s): VITAMINB12, FOLATE, FERRITIN, TIBC, IRON, RETICCTPCT in the last 72 hours. Urine analysis:    Component Value Date/Time   COLORURINE  YELLOW 09/16/2020 1256   APPEARANCEUR CLOUDY (A) 09/16/2020 1256   LABSPEC 1.011 09/16/2020 1256   PHURINE 6.0 09/16/2020 1256   GLUCOSEU NEGATIVE 09/16/2020 1256   GLUCOSEU NEGATIVE 07/30/2011 1440   HGBUR SMALL (A) 09/16/2020 1256   BILIRUBINUR NEGATIVE 09/16/2020 1256   KETONESUR NEGATIVE 09/16/2020 1256   PROTEINUR 100 (A) 09/16/2020 1256   UROBILINOGEN 0.2 09/30/2011 1824   NITRITE NEGATIVE 09/16/2020 1256   LEUKOCYTESUR LARGE (A) 09/16/2020 1256    Radiological Exams on Admission: DG Chest 2 View Result Date: 09/18/2023 CLINICAL DATA:  Hypoxia and tremors. EXAM: CHEST - 2 VIEW COMPARISON:  September 11, 2023 FINDINGS: The cardiac silhouette is mildly enlarged and unchanged in size. Moderate severity calcification of the aortic arch is seen. Low lung volumes are noted with mild areas of atelectasis and/or infiltrate seen within the mid left lung and bilateral lung bases. Small bilateral pleural effusions are noted. No pneumothorax is identified. The visualized skeletal structures are unremarkable. IMPRESSION: 1. Cardiomegaly and low lung volumes with mild mid left lung and bibasilar atelectasis and/or infiltrate. 2. Small bilateral pleural effusions. Electronically Signed   By: Suzen Dials M.D.   On: 09/18/2023 19:50      Assessment/Plan   Principal Problem:   Influenza A Active Problems:   Essential hypertension   ESRD on dialysis (HCC)   History of anemia due to chronic kidney disease   Chronic diastolic CHF (congestive heart failure) (HCC)   Acute hypoxic respiratory failure (HCC)   DM2 (diabetes mellitus, type 2) (HCC)   Severe sepsis (HCC)   Acute metabolic encephalopathy   Hypoglycemia   History of seizures     #) Severe sepsis due to influenza A infection: Diagnosed with influenza A as an outpatient 5 days ago, with interval progression and cough, now with development of subjective fever, chills, perpetuation of generalized myalgias, and confirmation of  positive influenza A PCR performed in the ED today.  SIRS  criteria met via objective fever, tachypnea. Lactic acid level: 1.3. Of note, given the associated presence of suspected end organ damage in the form of concominant presenting acute hypoxic respiratory distress as well as suspected acute metabolic encephalopathy, criteria are met for pt's sepsis to be considered severe in nature. However, in the absence of lactic acid level that is greater than or equal to 4.0, and in the absence of any associated hypotension refractory to IVF's, there are no indications for administration of a 30 mL/kg IVF bolus at this time.   No evidence of additional underlying infectious process at this time, including no evidence of bacterial infection.  Presenting chest x-ray without overt evidence of infiltrate.  Will add on procalcitonin level to further assess.  In the setting of his end-stage renal disease on hemodialysis, is confirmed that the patient is an uric at baseline.  Will therefore discontinue order for urinalysis at this time.  Blood cultures x 2 were collected in the ED today.  In the absence of any evidence of underlying bacterial infection at this time, we will refrain from initiation of antibiotics.    Of note, the patient has completed a 5-day course of every 48 hour dosing of Tamiflu  over the last 5 days.  Will pursue additional supportive measures, as outlined below.   Plan: CBC w/ diff and CMP in AM.  Follow for results of blood cx's x 2.  Scheduled Mucinex , as needed Tessalon  Perles.  Add on procalcitonin level.  Flutter valve, incentive spirometry.  As needed acetaminophen  for fever.  Monitor on telemetry.                 #) Acute hypoxic respiratory distress: in the context of acute respiratory symptoms and no known baseline supplemental O2 requirements, presenting O2 sat note to be 85% on room air, subsequently improving to 97% on 3 L nasal cannula, thereby meeting criteria for  acute hypoxic respiratory distress as opposed to acute hypoxic respiratory failure at this time. Appears to be on basis of influenza A infection, as above. Presenting CXR shows low lung volumes and mild mid left lung and bibasilar atelectasis as well as small bilateral pleural effusions, withou overt evidence oft infiltrate, edema, or pneumothorax.  While present chest x-ray does not appear to indicate underlying pneumonia, will add on procalcitonin level to further assess.  Clinically and radiographically, presentation appears less suggestive of acutely decompensated heart failure.  His BNP is noted to be elevated relative to most recent prior BNP value, although there is a 3-year gap between these data points.  Overall, acute on chronic diastolic heart failure appears to be less likely at this time, but will continue to trend BNP values and closely monitor for evidence of volume overload, as further outlined below.  As noted above, no known chronic underlying pulmonary conditions .  In terms of other considered etiologies, ACS appears less likely at this time in the absence of any recent CP and in the context of presenting EKG showing no e/o acute ischemic process. Clinically, presentation is less suggestive of acute PE at this time. COVID-19/RSV PCR are negative.   Plan: further evaluation/management of presenting influenza A infection, as above. .  Add on procalcitonin level . monitor on telemetry. CMP/CBC in the AM. Check serum Mg and Phos levels.  Flutter valve, incentive spirometry.  Repeat BMP in the morning.  Is most recent echocardiogram occurred 3 years ago, will repeat echocardiogram in the morning.                     #)  Acute metabolic encephalopathy: Patient with evidence of somnolence at the time of this evening's presentation.  There is concern for potential contribution from physiologic stressors stemming from presenting severe sepsis due to influenza A infection.  There  may also be a secondary contribution from presenting hypoglycemia.  Presenting blood gas shows combined respiratory/metabolic alkalosis, and demonstrates no evidence of contributory hypercapnia.  No additional overt metabolic/electrolyte contributions at this time.  Differential also includes potential contribution from outpatient central acting medications, including Keppra  as well as scheduled clonazepam , which she takes in the setting of a history of seizures.  Of note, no evidence of underlying seizure activity at this time.  Additionally, no evidence of acute focal neurologic deficits to suggest contribution from underlying acute CVA.  CT head is currently pending at this time.  Plan: fall precautions. Delirium precautions. Repeat CMP/CBC in the AM. Check Mg level. check TSH, B12 level.  Follow-up for result of CT head.  Further evaluation management of severe sepsis due to influenza A infection, as above as well as further evaluation management of hypoglycemia, as outlined below.  Add on procalcitonin level.  Check ammonia level.                 # Emergency hypoglycemia in the setting of a history of type 2 diabetes mellitus: In the context of a history of type 2 diabetes mellitus which is managed via lifestyle modifications in the absence of any outpatient use of insulin  or oral hypoglycemic agents, with most recent hemoglobin A1c noted to be 5.2% on 09/15/2023, presenting labs reflect mild hypoglycemia with initial CBG of 64, notable given concern for presenting acute metabolic encephalopathy.  Suspect hypoglycemic contribution from acute infection superimposed on baseline susceptibility for hypoglycemia given end-stage renal disease on hemodialysis.   Plan: Acute 2-hour CBG monitoring x 2 occurrences followed by Accu-Cheks on before every meal and at bedtime basis.  Prn amp of D50 for ensuing CBG less than 70.                    #) ESRD: on HD (schedule: Monday,  Wednesday, Friday).  Completed the totality of his scheduled hemodialysis session today, 09/18/2023 .next due for routine HD on Monday, 09/21/2023. No clinical evidence for urgent overnight HD or to expedite HD relative to this timeframe.   Plan: monitor strict I's/O's, daily weights. CMP in the AM. Check mag and phos levels.                      #) Essential Hypertension: documented h/o such, with outpatient antihypertensive regimen including amlodipine , Coreg , hydralazine , losartan .  SBP's in the ED today: 150s mmHg. in the setting of presenting severe sepsis, will be conservative with resumption of outpatient antihypertensive medications.  Included in this, will hold home Coreg  for now in the setting of influenza A infection due to increased risk for exacerbation of acute hypoxic respiratory failure due to associated beta-2 antagonism.   Plan: Close monitoring of subsequent BP via routine VS. hold home Coreg  and amlodipine  for now.  Resume home hydralazine  and losartan .                    #) History of seizures: Documented history of such, without clinical evidence to suggest active seizures at this time.  Outpatient antiepileptic regimen consists of: Keppra  and daily scheduled clonazepam .    Plan: Continue outpatient antiepileptic regimen.                    #)  Anemia of chronic kidney disease: Documented history of such, a/w with baseline hgb range 11-13, with presenting hgb consistent with this range, in the absence of any overt evidence of active bleed.  Of note, PTT and INR found to be within normal limits.   Plan: Repeat CBC in the morning.                        #) Chronic diastolic heart failure: documented history of such, with most recent echocardiogram performed in February 2022, which was notable for LVEF 60 to 65%, grade 2 diastolic dysfunction, normal right ventricular systolic function and mild to moderate  mitral regurgitation. No clinical or radiographic evidence to suggest acutely decompensated heart failure at this time.  However, presentation is associated with interval elevation in BNP, and he is at risk for development of acutely decompensated heart failure in the setting of his acute viral infection.  It is noted that he is an uric at baseline, and completed the totality of his scheduled hemodialysis session earlier today.   Plan: monitor strict I's & O's and daily weights. Repeat CMP in AM. Check serum mag level.  Repeat BMP in the morning.      DVT prophylaxis: SCD's   Code Status: Full code Family Communication: I discussed patient's case with his wife, who was present at bedside Disposition Plan: Per Rounding Team Consults called: none;  Admission status: Inpatient     I SPENT GREATER THAN 75  MINUTES IN CLINICAL CARE TIME/MEDICAL DECISION-MAKING IN COMPLETING THIS ADMISSION.      Eva NOVAK Yeilin Zweber DO [facility]  From 7PM - 7AM   09/18/2023, 9:31 PM

## 2023-09-18 NOTE — ED Provider Notes (Signed)
  EMERGENCY DEPARTMENT AT [facility] Provider Note   CSN: [id number] Arrival date & time: 09/18/23  1643     History  Chief Complaint  Patient presents with   Tremors    [name] is a 73 y.o. male.  Patinent is a 73 yo male with pmh htn, dm, seizures, hyperlipidemia, ckd on dialysis M/W/F presenting for tremors. Wife states she was picking him up from dialysis today when upper extremity bilateral tremors. Pt was awake and speaking at this time. Currently patient is sleeping and difficult to arouse. Patient's wife states this is typical after dialysis. He was febrile with a temp of 100.8 F on arrival. Wife denies complaints of of coughing, abdominal pain, nausea, or vomiting. States prior to dialysis today he was feeling generalized weakness but otherwise no symptoms.   Chart review demonstrates he was discharged from hospital on 09/13/2023, five days ago, with dx of encephalopathy, fever, influenza A. He arrived to ED tachypeic and hypoxic at 85% on room air. Does not wear home oxygen.   The history is provided by the patient and the spouse. No language interpreter was used.       Home Medications Prior to Admission medications   Medication Sig Start Date End Date Taking? Authorizing Provider  acetaminophen  (TYLENOL ) 500 MG tablet Take 1,000 mg by mouth every 8 (eight) hours as needed for moderate pain.   Yes [provider]  amLODipine  (NORVASC ) 10 MG tablet Take 10 mg by mouth at bedtime. 06/30/23  Yes [provider]  aspirin  81 MG chewable tablet Chew 1 tablet (81 mg total) by mouth daily. 09/14/23 10/14/23 Yes Leotis Bogus, MD  calcitRIOL  (ROCALTROL ) 0.5 MCG capsule Take 0.5 mcg by mouth See admin instructions. Take one tablet by mouth three times a week on Monday, Wednesday and Fridays per wife 04/20/23  Yes [provider]  carvedilol  (COREG ) 25 MG tablet Take 25 mg by mouth 2 (two) times daily with a meal.   Yes [provider]  guaiFENesin  (MUCINEX ) 600 MG 12 hr tablet Take 1 tablet (600 mg total) by mouth 2 (two) times daily. 09/13/23 10/13/23 Yes Leotis Bogus, MD  senna-docusate (SENOKOT-S) 8.6-50 MG tablet Take 1 tablet by mouth 2 (two) times daily. 09/13/23 10/13/23 Yes Leotis Bogus, MD  sevelamer  carbonate (RENVELA ) 800 MG tablet Take 1,600 mg by mouth 3 (three) times daily with meals. 04/01/21  Yes [provider]  tiZANidine (ZANAFLEX) 2 MG tablet Take 2 mg by mouth every 8 (eight) hours as needed for muscle spasms. 09/15/23  Yes [provider]  ceFAZolin  (ANCEF ) 2-4 GM/100ML-% IVPB Inject 100 mLs (2 g total) into the vein every Monday, Wednesday, and Friday with hemodialysis. 09/25/23 10/29/23  Raenelle Donalda HERO, MD  cinacalcet  (SENSIPAR ) 30 MG tablet Take 1 tablet (30 mg total) by mouth every Monday, Wednesday, and Friday. Patient not taking: Reported on 09/18/2023 09/16/23 10/16/23  Alvia Krabbe, FNP  clonazePAM  (KLONOPIN ) 2 MG disintegrating tablet Take 1 tablet (2 mg total) by mouth daily. Patient not taking: Reported on 09/18/2023 09/13/23   Leotis Bogus, MD  hydrALAZINE  (APRESOLINE ) 25 MG tablet Take 1 tablet (25 mg total) by mouth 2 (two) times daily. 09/24/23   Ghimire, Donalda HERO, MD  levETIRAcetam  (KEPPRA ) 500 MG tablet Take 1 tablet (500 mg total) by mouth daily. 09/24/23 10/24/23  GhimireDonalda HERO, MD      Allergies    Patient has no known allergies.    Review of Systems  Review of Systems  Constitutional:  Negative for chills and fever.  HENT:  Negative for ear pain and sore throat.   Eyes:  Negative for pain and visual disturbance.  Respiratory:  Negative for cough and shortness of breath.   Cardiovascular:  Negative for chest pain and palpitations.  Gastrointestinal:  Negative for abdominal pain and vomiting.  Genitourinary:  Negative for dysuria and hematuria.  Musculoskeletal:  Negative for arthralgias and back pain.  Skin:  Negative for color change and rash.   Neurological:  Positive for tremors. Negative for seizures and syncope.  All other systems reviewed and are negative.   Physical Exam Updated Vital Signs BP (!) 117/55 (BP Location: Left Arm)   Pulse 79   Temp 97.7 F (36.5 C) (Oral)   Resp 20   Wt 72.6 kg   SpO2 93%   BMI 22.32 kg/m  Physical Exam Vitals and nursing note reviewed.  Constitutional:      General: He is not in acute distress.    Appearance: He is well-developed.  HENT:     Head: Normocephalic and atraumatic.  Eyes:     Conjunctiva/sclera: Conjunctivae normal.  Cardiovascular:     Rate and Rhythm: Normal rate and regular rhythm.     Heart sounds: No murmur heard. Pulmonary:     Effort: Pulmonary effort is normal. No respiratory distress.     Breath sounds: Normal breath sounds.  Abdominal:     Palpations: Abdomen is soft.     Tenderness: There is no abdominal tenderness.  Musculoskeletal:        General: No swelling.     Cervical back: Neck supple.  Skin:    General: Skin is warm and dry.     Capillary Refill: Capillary refill takes less than 2 seconds.  Neurological:     General: No focal deficit present.     Mental Status: He is lethargic.     GCS: GCS eye subscore is 4. GCS verbal subscore is 5. GCS motor subscore is 6.     Cranial Nerves: Cranial nerves 2-12 are intact.     Sensory: Sensation is intact.     Motor: Weakness (generalized) and tremor present.     Comments: Somnolent but arousable  Psychiatric:        Mood and Affect: Mood normal.     ED Results / Procedures / Treatments   Labs (all labs ordered are listed, but only abnormal results are displayed) Labs Reviewed  RESP PANEL BY RT-PCR (RSV, FLU A&B, COVID)  RVPGX2 - Abnormal; Notable for the following components:      Result Value   Influenza A by PCR POSITIVE (*)    All other components within normal limits  CULTURE, BLOOD (ROUTINE X 2) - Abnormal; Notable for the following components:   Culture STAPHYLOCOCCUS EPIDERMIDIS  (*)    All other components within normal limits  CULTURE, BLOOD (ROUTINE X 2) - Abnormal; Notable for the following components:   Culture STAPHYLOCOCCUS EPIDERMIDIS (*)    All other components within normal limits  BLOOD CULTURE ID PANEL (REFLEXED) - BCID2 - Abnormal; Notable for the following components:   Staphylococcus species DETECTED (*)    Staphylococcus epidermidis DETECTED (*)    All other components within normal limits  CBC WITH DIFFERENTIAL/PLATELET - Abnormal; Notable for the following components:   Hemoglobin 11.4 (*)    HCT 37.5 (*)    MCH 25.7 (*)    RDW 19.0 (*)    Platelets 123 (*)  Neutro Abs 9.3 (*)    Lymphs Abs 0.5 (*)    Abs Immature Granulocytes 0.12 (*)    All other components within normal limits  COMPREHENSIVE METABOLIC PANEL - Abnormal; Notable for the following components:   Chloride 93 (*)    Creatinine, Ser 4.60 (*)    Calcium  8.4 (*)    Total Protein 6.4 (*)    Albumin 2.4 (*)    ALT 46 (*)    GFR, Estimated 13 (*)    All other components within normal limits  BRAIN NATRIURETIC PEPTIDE - Abnormal; Notable for the following components:   B Natriuretic Peptide >4,500.0 (*)    All other components within normal limits  CBC WITH DIFFERENTIAL/PLATELET - Abnormal; Notable for the following components:   RBC 3.93 (*)    Hemoglobin 10.2 (*)    HCT 33.6 (*)    RDW 18.9 (*)    Platelets 114 (*)    Abs Immature Granulocytes 0.15 (*)    All other components within normal limits  COMPREHENSIVE METABOLIC PANEL - Abnormal; Notable for the following components:   Chloride 95 (*)    Creatinine, Ser 4.99 (*)    Calcium  7.7 (*)    Total Protein 5.4 (*)    Albumin 2.0 (*)    GFR, Estimated 12 (*)    All other components within normal limits  BRAIN NATRIURETIC PEPTIDE - Abnormal; Notable for the following components:   B Natriuretic Peptide >4,500.0 (*)    All other components within normal limits  VITAMIN B12 - Abnormal; Notable for the following  components:   Vitamin B-12 1,549 (*)    All other components within normal limits  CBC WITH DIFFERENTIAL/PLATELET - Abnormal; Notable for the following components:   Hemoglobin 11.6 (*)    HCT 37.2 (*)    RDW 19.0 (*)    Platelets 123 (*)    Abs Immature Granulocytes 0.08 (*)    All other components within normal limits  GLUCOSE, CAPILLARY - Abnormal; Notable for the following components:   Glucose-Capillary 111 (*)    All other components within normal limits  BASIC METABOLIC PANEL - Abnormal; Notable for the following components:   Chloride 94 (*)    BUN 31 (*)    Creatinine, Ser 7.38 (*)    GFR, Estimated 7 (*)    All other components within normal limits  CBC - Abnormal; Notable for the following components:   Hemoglobin 11.0 (*)    HCT 37.0 (*)    MCH 25.8 (*)    MCHC 29.7 (*)    RDW 19.1 (*)    Platelets 108 (*)    All other components within normal limits  BASIC METABOLIC PANEL - Abnormal; Notable for the following components:   Chloride 92 (*)    BUN 45 (*)    Creatinine, Ser 8.85 (*)    GFR, Estimated 6 (*)    All other components within normal limits  GLUCOSE, CAPILLARY - Abnormal; Notable for the following components:   Glucose-Capillary 69 (*)    All other components within normal limits  GLUCOSE, CAPILLARY - Abnormal; Notable for the following components:   Glucose-Capillary 116 (*)    All other components within normal limits  GLUCOSE, CAPILLARY - Abnormal; Notable for the following components:   Glucose-Capillary 69 (*)    All other components within normal limits  GLUCOSE, CAPILLARY - Abnormal; Notable for the following components:   Glucose-Capillary 69 (*)    All other components within normal  limits  CBC - Abnormal; Notable for the following components:   RBC 4.01 (*)    Hemoglobin 10.2 (*)    HCT 33.9 (*)    MCH 25.4 (*)    RDW 18.8 (*)    All other components within normal limits  BASIC METABOLIC PANEL - Abnormal; Notable for the following  components:   Sodium 131 (*)    Potassium 5.2 (*)    Chloride 92 (*)    BUN 35 (*)    Creatinine, Ser 7.90 (*)    GFR, Estimated 7 (*)    Anion gap 16 (*)    All other components within normal limits  PHOSPHORUS - Abnormal; Notable for the following components:   Phosphorus 4.9 (*)    All other components within normal limits  GLUCOSE, CAPILLARY - Abnormal; Notable for the following components:   Glucose-Capillary 62 (*)    All other components within normal limits  CBC - Abnormal; Notable for the following components:   RBC 3.98 (*)    Hemoglobin 10.2 (*)    HCT 33.6 (*)    MCH 25.6 (*)    RDW 18.5 (*)    All other components within normal limits  RENAL FUNCTION PANEL - Abnormal; Notable for the following components:   Chloride 94 (*)    Creatinine, Ser 5.96 (*)    Albumin 2.2 (*)    GFR, Estimated 10 (*)    All other components within normal limits  GLUCOSE, CAPILLARY - Abnormal; Notable for the following components:   Glucose-Capillary 103 (*)    All other components within normal limits  CBG MONITORING, ED - Abnormal; Notable for the following components:   Glucose-Capillary 64 (*)    All other components within normal limits  I-STAT VENOUS BLOOD GAS, ED - Abnormal; Notable for the following components:   pH, Ven 7.528 (*)    pCO2, Ven 43.9 (*)    Bicarbonate 36.5 (*)    TCO2 38 (*)    Acid-Base Excess 12.0 (*)    Calcium , Ion 1.03 (*)    HCT 38.0 (*)    Hemoglobin 12.9 (*)    All other components within normal limits  CULTURE, BLOOD (SINGLE)  PROTIME-INR  APTT  MAGNESIUM  MAGNESIUM  PHOSPHORUS  PROCALCITONIN  AMMONIA  GLUCOSE, CAPILLARY  TSH  GLUCOSE, CAPILLARY  GLUCOSE, CAPILLARY  GLUCOSE, CAPILLARY  MAGNESIUM  GLUCOSE, CAPILLARY  GLUCOSE, CAPILLARY  GLUCOSE, CAPILLARY  GLUCOSE, CAPILLARY  GLUCOSE, CAPILLARY  GLUCOSE, CAPILLARY  GLUCOSE, CAPILLARY  GLUCOSE, CAPILLARY  I-STAT CG4 LACTIC ACID, ED  CBG MONITORING, ED  CBG MONITORING, ED  CBG  MONITORING, ED    EKG None  Radiology No results found.   Procedures .Critical Care  Performed by: Elnor Bernarda SQUIBB, DO Authorized by: Elnor Bernarda SQUIBB, DO   Critical care provider statement:    Critical care time (minutes):  75   Critical care was necessary to treat or prevent imminent or life-threatening deterioration of the following conditions:  Sepsis   Critical care was time spent personally by me on the following activities:  Development of treatment plan with patient or surrogate, discussions with consultants, evaluation of patient's response to treatment, examination of patient, ordering and review of laboratory studies, ordering and review of radiographic studies, ordering and performing treatments and interventions, pulse oximetry, re-evaluation of patient's condition and review of old charts     Medications Ordered in ED Medications  lactulose  (CHRONULAC ) 10 GM/15ML solution 30 g (30 g Oral Patient Refused/Not  Given 09/22/23 0102)  polyethylene glycol (MIRALAX  / GLYCOLAX ) packet 17 g (17 g Oral Patient Refused/Not Given 09/21/23 2208)  acetaminophen  (TYLENOL ) tablet 650 mg (650 mg Oral Given 09/18/23 2021)  heparin  injection 3,000 Units (3,000 Units Dialysis Given 09/21/23 0930)  mineral oil enema 1 enema (1 enema Rectal Given 09/20/23 2245)  ketoconazole  (NIZORAL ) 2 % shampoo ( Topical Given 09/23/23 1654)  sodium zirconium cyclosilicate  (LOKELMA ) packet 10 g (10 g Oral Given 09/23/23 0800)  ceFAZolin  (ANCEF ) IVPB 2g/100 mL premix (2 g Intravenous New Bag/Given 09/23/23 1648)    ED Course/ Medical Decision Making/ A&P                                 Medical Decision Making Amount and/or Complexity of Data Reviewed Radiology: ordered.  Risk OTC drugs. Decision regarding hospitalization.   73 yo male with pmh ckd on dialysis on mon, wed, Friday presenting for tremors.  Was seen in triage and code sepsis was activated for a temperature of 100.8 and respiratory rate of 22  breaths/min with an oxygen rate of 85%.  Patient currently on nasal cannula 2 L.  7:33 AM On chart review it looks like patient was discharged 5 days ago with diagnosis of influenza after hospital admission.  I think this is likely causing his fever and his hypoxia.  The chest x-ray is stable with no focal consolidations.  Patient did receive blood cultures, lactic acid, and urine studies.  Will cancel urine study bc patient doesn't make urine. Lactic acid normal.  No need for repeat.  He is a dialysis patient so we will hold off on fluid overloading him at this time.  No hypotension.  Blood pressure currently 153/94.  Aminofen 650 mg ordered.  Due to influenza diagnosis I will hold off on antibiotic therapy unless bacterial infection proven.  Recommending admission at this time for acute respiratory failure with hypoxia secondary to influenza.  Accepted by admitting physician Dr. Marcene.        Final Clinical Impression(s) / ED Diagnoses Final diagnoses:  Influenza  Chills  Fever, unspecified fever cause  Acute respiratory failure with hypoxia (HCC)    Rx / DC Orders ED Discharge Orders          Ordered    ceFAZolin  (ANCEF ) 2-4 GM/100ML-% IVPB  Every M-W-F (Hemodialysis)        09/24/23 1109    levETIRAcetam  (KEPPRA ) 500 MG tablet  Daily        09/24/23 1109    hydrALAZINE  (APRESOLINE ) 25 MG tablet  2 times daily        09/24/23 1109    Increase activity slowly        09/24/23 1109    Diet - low sodium heart healthy        09/24/23 1109    Discharge instructions       Comments: Follow with Primary MD  Joshua [name] CROME, MD in 1-2 weeks  Follow-up with infectious disease clinic-their office will call you with appointment  Follow-up with your hemodialysis center as previously scheduled.  Please get a complete blood count and chemistry panel checked by your Primary MD at your next visit, and again as instructed by your Primary MD.  Get Medicines reviewed and  adjusted: Please take all your medications with you for your next visit with your Primary MD  Laboratory/radiological data: Please request your Primary MD to go over all hospital  tests and procedure/radiological results at the follow up, please ask your Primary MD to get all Hospital records sent to his/her office.  In some cases, they will be blood work, cultures and biopsy results pending at the time of your discharge. Please request that your primary care M.D. follows up on these results.  Also Note the following: If you experience worsening of your admission symptoms, develop shortness of breath, life threatening emergency, suicidal or homicidal thoughts you must seek medical attention immediately by calling 911 or calling your MD immediately  if symptoms less severe.  You must read complete instructions/literature along with all the possible adverse reactions/side effects for all the Medicines you take and that have been prescribed to you. Take any new Medicines after you have completely understood and accpet all the possible adverse reactions/side effects.   Do not drive when taking Pain medications or sleeping medications (Benzodaizepines)  Do not take more than prescribed Pain, Sleep and Anxiety Medications. It is not advisable to combine anxiety,sleep and pain medications without talking with your primary care practitioner  Special Instructions: If you have smoked or chewed Tobacco  in the last 2 yrs please stop smoking, stop any regular Alcohol  and or any Recreational drug use.  Wear Seat belts while driving.  Please note: You were cared for by a hospitalist during your hospital stay. Once you are discharged, your primary care physician will handle any further medical issues. Please note that NO REFILLS for any discharge medications will be authorized once you are discharged, as it is imperative that you return to your primary care physician (or establish a relationship with a  primary care physician if you do not have one) for your post hospital discharge needs so that they can reassess your need for medications and monitor your lab values.   09/24/23 1109    Call MD for:  difficulty breathing, headache or visual disturbances        09/24/23 1109              Elnor Bernarda SQUIBB, DO 09/26/23 [phone]

## 2023-09-18 NOTE — Sepsis Progress Note (Signed)
 Elink monitoring for the code sepsis protocol.

## 2023-09-19 ENCOUNTER — Inpatient Hospital Stay (HOSPITAL_COMMUNITY): Payer: Medicare Other

## 2023-09-19 DIAGNOSIS — N186 End stage renal disease: Secondary | ICD-10-CM

## 2023-09-19 DIAGNOSIS — R0603 Acute respiratory distress: Secondary | ICD-10-CM

## 2023-09-19 DIAGNOSIS — N189 Chronic kidney disease, unspecified: Secondary | ICD-10-CM

## 2023-09-19 DIAGNOSIS — J9601 Acute respiratory failure with hypoxia: Secondary | ICD-10-CM | POA: Diagnosis not present

## 2023-09-19 DIAGNOSIS — I10 Essential (primary) hypertension: Secondary | ICD-10-CM

## 2023-09-19 DIAGNOSIS — E119 Type 2 diabetes mellitus without complications: Secondary | ICD-10-CM

## 2023-09-19 DIAGNOSIS — G9341 Metabolic encephalopathy: Secondary | ICD-10-CM | POA: Diagnosis not present

## 2023-09-19 DIAGNOSIS — Z992 Dependence on renal dialysis: Secondary | ICD-10-CM

## 2023-09-19 DIAGNOSIS — Z87898 Personal history of other specified conditions: Secondary | ICD-10-CM

## 2023-09-19 DIAGNOSIS — A419 Sepsis, unspecified organism: Secondary | ICD-10-CM | POA: Diagnosis present

## 2023-09-19 DIAGNOSIS — E162 Hypoglycemia, unspecified: Secondary | ICD-10-CM | POA: Diagnosis present

## 2023-09-19 DIAGNOSIS — J101 Influenza due to other identified influenza virus with other respiratory manifestations: Secondary | ICD-10-CM

## 2023-09-19 DIAGNOSIS — I5032 Chronic diastolic (congestive) heart failure: Secondary | ICD-10-CM

## 2023-09-19 DIAGNOSIS — Z862 Personal history of diseases of the blood and blood-forming organs and certain disorders involving the immune mechanism: Secondary | ICD-10-CM

## 2023-09-19 DIAGNOSIS — R6520 Severe sepsis without septic shock: Secondary | ICD-10-CM | POA: Diagnosis present

## 2023-09-19 LAB — BLOOD CULTURE ID PANEL (REFLEXED) - BCID2

## 2023-09-19 LAB — CBC WITH DIFFERENTIAL/PLATELET
Abs Immature Granulocytes: 0.15 10*3/uL — ABNORMAL HIGH (ref 0.00–0.07)
Basophils Absolute: 0 10*3/uL (ref 0.0–0.1)
Basophils Relative: 0 %
Eosinophils Absolute: 0 10*3/uL (ref 0.0–0.5)
Eosinophils Relative: 0 %
HCT: 33.6 % — ABNORMAL LOW (ref 39.0–52.0)
Hemoglobin: 10.2 g/dL — ABNORMAL LOW (ref 13.0–17.0)
Immature Granulocytes: 2 %
Lymphocytes Relative: 8 %
Lymphs Abs: 0.7 10*3/uL (ref 0.7–4.0)
MCH: 26 pg (ref 26.0–34.0)
MCHC: 30.4 g/dL (ref 30.0–36.0)
MCV: 85.5 fL (ref 80.0–100.0)
Monocytes Absolute: 0.5 10*3/uL (ref 0.1–1.0)
Monocytes Relative: 6 %
Neutro Abs: 7.4 10*3/uL (ref 1.7–7.7)
Neutrophils Relative %: 84 %
Platelets: 114 10*3/uL — ABNORMAL LOW (ref 150–400)
RBC: 3.93 MIL/uL — ABNORMAL LOW (ref 4.22–5.81)
RDW: 18.9 % — ABNORMAL HIGH (ref 11.5–15.5)
WBC: 8.7 10*3/uL (ref 4.0–10.5)
nRBC: 0 % (ref 0.0–0.2)

## 2023-09-19 LAB — MAGNESIUM: Magnesium: 2 mg/dL (ref 1.7–2.4)

## 2023-09-19 LAB — ECHOCARDIOGRAM COMPLETE
AR max vel: 1.46 cm2
AV Area VTI: 1.73 cm2
AV Area mean vel: 1.68 cm2
AV Mean grad: 8.3 mm[Hg]
AV Peak grad: 17 mm[Hg]
Ao pk vel: 2.06 m/s
Area-P 1/2: 3.72 cm2
P 1/2 time: 335 ms
S' Lateral: 3.7 cm

## 2023-09-19 LAB — COMPREHENSIVE METABOLIC PANEL
ALT: 37 U/L (ref 0–44)
AST: 22 U/L (ref 15–41)
Albumin: 2 g/dL — ABNORMAL LOW (ref 3.5–5.0)
Alkaline Phosphatase: 72 U/L (ref 38–126)
Anion gap: 13 (ref 5–15)
BUN: 17 mg/dL (ref 8–23)
CO2: 28 mmol/L (ref 22–32)
Calcium: 7.7 mg/dL — ABNORMAL LOW (ref 8.9–10.3)
Chloride: 95 mmol/L — ABNORMAL LOW (ref 98–111)
Creatinine, Ser: 4.99 mg/dL — ABNORMAL HIGH (ref 0.61–1.24)
GFR, Estimated: 12 mL/min — ABNORMAL LOW (ref >60)
Glucose, Bld: 82 mg/dL (ref 70–99)
Potassium: 3.9 mmol/L (ref 3.5–5.1)
Sodium: 136 mmol/L (ref 135–145)
Total Bilirubin: 0.7 mg/dL (ref 0.0–1.2)
Total Protein: 5.4 g/dL — ABNORMAL LOW (ref 6.5–8.1)

## 2023-09-19 LAB — CBG MONITORING, ED
Glucose-Capillary: 82 mg/dL (ref 70–99)
Glucose-Capillary: 70 mg/dL (ref 70–99)
Glucose-Capillary: 89 mg/dL (ref 70–99)

## 2023-09-19 LAB — AMMONIA: Ammonia: 15 umol/L (ref 9–35)

## 2023-09-19 LAB — GLUCOSE, CAPILLARY
Glucose-Capillary: 84 mg/dL (ref 70–99)
Glucose-Capillary: 111 mg/dL — ABNORMAL HIGH (ref 70–99)

## 2023-09-19 LAB — BRAIN NATRIURETIC PEPTIDE: B Natriuretic Peptide: >4500 pg/mL — ABNORMAL HIGH (ref 0.0–100.0)

## 2023-09-19 LAB — PHOSPHORUS: Phosphorus: 4.5 mg/dL (ref 2.5–4.6)

## 2023-09-19 MED ORDER — SODIUM CHLORIDE 0.9 % IV SOLN
500.0000 mg | Freq: Every day | INTRAVENOUS | Status: DC
  Administered 2023-09-20 (×2): 500 mg via INTRAVENOUS
  Filled 2023-09-19 (×3): qty 5

## 2023-09-19 MED ORDER — SODIUM CHLORIDE 0.9 % IV SOLN
1.0000 g | Freq: Every day | INTRAVENOUS | Status: DC
  Administered 2023-09-20 (×2): 1 g via INTRAVENOUS
  Filled 2023-09-19 (×2): qty 10

## 2023-09-19 MED ORDER — CLONAZEPAM 0.5 MG PO TBDP: 2.0000 mg | ORAL_TABLET | Freq: Every day | ORAL | Status: DC | PRN

## 2023-09-19 NOTE — Progress Notes (Signed)
 [facility]                                                                               [name], is a 73 y.o. male, DOB - 1951-05-07, FMW:[id number] Admit date - 09/18/2023    Outpatient Primary MD for the patient is Greg [name] CROME, MD  LOS - 1  days    Brief summary   [name] is a 73 y.o. male with medical history significant for end-stage renal disease on hemodialysis on Monday, Wednesday, Friday schedule, type 2 diabetes mellitus, essential hypertension, seizures, chronic diastolic heart failure, who is admitted to [facility] on 09/18/2023 with influenza A infection after presenting from home to [facility] ED complaining of fever.    Patient on hemodialysis on Monday, Wednesday, Friday schedule, and was able to complete today's hemodialysis session. However, following hemodialysis, his subjective fever, chills intensified, resulting in his presentation to [facility] emergency department for further evaluation management thereof.  Influenza A PCR was confirmed to be positive. COVID, influenza B, and RSV PCR were all negative.   Assessment & Plan    Assessment and Plan:  Fever of 100.8, tachypnea 22/min , influenza A infection. Normal wbc count,  Lactic acid wnl. Acute respiratory failure with hypoxia from influenza A infection.  COVID-19/RSV PCR are negative.  Sepsis from influenza A infection. He is outside the window for tamiflu .  Improving. CXR shows Cardiomegaly and low lung volumes with mild mid left lung and bibasilar atelectasis and/or infiltrate. Will empirically start him on IV ceftriaxone  and azithromycin .  Follow up blood cultures.    Acute metabolic encephalopathy Probably from the above.  CT head Atrophy with chronic microvascular ischemic changes.   Hypoglycemia in the setting of type 2 DM:  CBG (last 3)  Recent Labs    09/19/23 0444 09/19/23 0731 09/19/23 1139  GLUCAP 82 70 89   Probably from influenza A infection.   Monitor.  Dietary will be consulted.     Thrombocytopenia:  Improving.    ESRD on HD Nephrology will be consulted.   Hypertension:  Well controlled.    Seizures:  Continue with keppra .    Chronic diastolic heart failure:  Repeat echo ordered.  No signs of decompensation.  Fluid management with HD.      Anemia of chronic disease;  Hemoglobin appears adequate.   Estimated body mass index is 21.62 kg/m as calculated from the following:   Height as of 09/15/23: 5' 11 (1.803 m).   Weight as of 09/15/23: 70.3 kg.  Code Status: full code.  DVT Prophylaxis:  SCDs Start: 09/18/23 2130   Level of Care: Level of care: Progressive Family Communication: none at bedside.   Disposition Plan:     Remains inpatient appropriate:  IV antibiotics, pending clinical improvement.   Procedures:  echo  Consultants:   None.   Antimicrobials:   Anti-infectives (From admission, onward)    None        Medications  Scheduled Meds:  aspirin   81 mg Oral Daily   guaiFENesin   600 mg Oral BID   hydrALAZINE   25 mg Oral BID   insulin  aspart  0-6 Units Subcutaneous  TID WC   levETIRAcetam   500 mg Oral Daily   losartan   25 mg Oral BID   sevelamer  carbonate  1,600 mg Oral TID WC   Continuous Infusions: PRN Meds:.acetaminophen  **OR** acetaminophen , benzonatate , clonazepam , dextrose , melatonin, ondansetron  (ZOFRAN ) IV    Subjective:   [name] was seen and examined today.  No new complaints.   Objective:   Vitals:   09/19/23 0923 09/19/23 0930 09/19/23 1000 09/19/23 1100  BP: 131/62 134/66 128/66 133/73  Pulse: 73 77 68 73  Resp: 15 (!) 22 (!) 22 16  Temp:      TempSrc:      SpO2: 97% 97% 94% 97%   No intake or output data in the 24 hours ending 09/19/23 1141 There were no vitals filed for this visit.   Exam General exam: Appears calm and comfortable  Respiratory system: Clear to auscultation. Respiratory effort normal. Cardiovascular system: S1 & S2  heard, RRR. No JVD, murmurs, rubs, gallops or clicks. No pedal edema. Gastrointestinal system: Abdomen is nondistended, soft and nontender. No organomegaly or masses felt. Normal bowel sounds heard. Central nervous system: Alert and oriented. No focal neurological deficits. Extremities: Symmetric 5 x 5 power. Skin: No rashes, lesions or ulcers Psychiatry: Judgement and insight appear normal. Mood & affect appropriate.     Data Reviewed:  I have personally reviewed following labs and imaging studies   CBC Lab Results  Component Value Date   WBC 8.7 09/19/2023   RBC 3.93 (L) 09/19/2023   HGB 10.2 (L) 09/19/2023   HCT 33.6 (L) 09/19/2023   MCV 85.5 09/19/2023   MCH 26.0 09/19/2023   PLT 114 (L) 09/19/2023   MCHC 30.4 09/19/2023   RDW 18.9 (H) 09/19/2023   LYMPHSABS 0.7 09/19/2023   MONOABS 0.5 09/19/2023   EOSABS 0.0 09/19/2023   BASOSABS 0.0 09/19/2023     Last metabolic panel Lab Results  Component Value Date   NA 136 09/19/2023   K 3.9 09/19/2023   CL 95 (L) 09/19/2023   CO2 28 09/19/2023   BUN 17 09/19/2023   CREATININE 4.99 (H) 09/19/2023   GLUCOSE 82 09/19/2023   GFRNONAA 12 (L) 09/19/2023   GFRAA 13 (L) 04/18/2020   CALCIUM  7.7 (L) 09/19/2023   PHOS 4.5 09/19/2023   PROT 5.4 (L) 09/19/2023   ALBUMIN 2.0 (L) 09/19/2023   BILITOT 0.7 09/19/2023   ALKPHOS 72 09/19/2023   AST 22 09/19/2023   ALT 37 09/19/2023   ANIONGAP 13 09/19/2023    CBG (last 3)  Recent Labs    09/19/23 0444 09/19/23 0731 09/19/23 1139  GLUCAP 82 70 89      Coagulation Profile: Recent Labs  Lab 09/18/23 1929  INR 1.2     Radiology Studies: CT Head Wo Contrast Result Date: 09/18/2023 CLINICAL DATA:  Altered mental status. Tremors after dialysis appointment today. EXAM: CT HEAD WITHOUT CONTRAST TECHNIQUE: Contiguous axial images were obtained from the base of the skull through the vertex without intravenous contrast. RADIATION DOSE REDUCTION: This exam was performed according  to the departmental dose-optimization program which includes automated exposure control, adjustment of the mA and/or kV according to patient size and/or use of iterative reconstruction technique. COMPARISON:  09/07/2023. FINDINGS: Brain: No acute intracranial hemorrhage, midline shift or mass effect. No extra-axial fluid collection. Mild atrophy is noted. Periventricular white matter hypodensities are present bilaterally. No hydrocephalus. Vascular: No hyperdense vessel or unexpected calcification. Skull: Normal. Negative for fracture or focal lesion. Sinuses/Orbits: There is partial opacification of the  ethmoid air cells and maxillary sinuses. Mucosal thickening is noted in the sphenoid sinuses bilaterally. No acute orbital abnormality. Other: None. IMPRESSION: 1. No acute intracranial process. 2. Atrophy with chronic microvascular ischemic changes. Electronically Signed   By: Leita Birmingham M.D.   On: 09/18/2023 23:30   DG Chest 2 View Result Date: 09/18/2023 CLINICAL DATA:  Hypoxia and tremors. EXAM: CHEST - 2 VIEW COMPARISON:  September 11, 2023 FINDINGS: The cardiac silhouette is mildly enlarged and unchanged in size. Moderate severity calcification of the aortic arch is seen. Low lung volumes are noted with mild areas of atelectasis and/or infiltrate seen within the mid left lung and bilateral lung bases. Small bilateral pleural effusions are noted. No pneumothorax is identified. The visualized skeletal structures are unremarkable. IMPRESSION: 1. Cardiomegaly and low lung volumes with mild mid left lung and bibasilar atelectasis and/or infiltrate. 2. Small bilateral pleural effusions. Electronically Signed   By: Suzen Dials M.D.   On: 09/18/2023 19:50       Elgie Butter M.D. [facility] 09/19/2023, 11:41 AM  Available via Epic secure chat 7am-7pm After 7 pm, please refer to night coverage provider listed on amion.

## 2023-09-19 NOTE — Plan of Care (Signed)

## 2023-09-19 NOTE — Progress Notes (Signed)
 PHARMACY - PHYSICIAN COMMUNICATION CRITICAL VALUE ALERT - BLOOD CULTURE IDENTIFICATION (BCID)  [name] is an 73 y.o. male who presented to [facility] on 09/18/2023 with a chief complaint of influenza A.  Assessment:  Pt with 1/2 blood cx bottles growing staph epi - ?contaminant. Second set of blood cx not drawn yet.  Name of physician (or Provider) Contacted: Dr. Cherlyn  Current antibiotics: None  Changes to prescribed antibiotics recommended:  No antibiotics recommended at this time  Results for orders placed or performed during the hospital encounter of 09/18/23  Blood Culture ID Panel (Reflexed) (Collected: 09/18/2023  7:00 PM)  Result Value Ref Range   Enterococcus faecalis NOT DETECTED NOT DETECTED   Enterococcus Faecium NOT DETECTED NOT DETECTED   Listeria monocytogenes NOT DETECTED NOT DETECTED   Staphylococcus species DETECTED (A) NOT DETECTED   Staphylococcus aureus (BCID) NOT DETECTED NOT DETECTED   Staphylococcus epidermidis DETECTED (A) NOT DETECTED   Staphylococcus lugdunensis NOT DETECTED NOT DETECTED   Streptococcus species NOT DETECTED NOT DETECTED   Streptococcus agalactiae NOT DETECTED NOT DETECTED   Streptococcus pneumoniae NOT DETECTED NOT DETECTED   Streptococcus pyogenes NOT DETECTED NOT DETECTED   A.calcoaceticus-baumannii NOT DETECTED NOT DETECTED   Bacteroides fragilis NOT DETECTED NOT DETECTED   Enterobacterales NOT DETECTED NOT DETECTED   Enterobacter cloacae complex NOT DETECTED NOT DETECTED   Escherichia coli NOT DETECTED NOT DETECTED   Klebsiella aerogenes NOT DETECTED NOT DETECTED   Klebsiella oxytoca NOT DETECTED NOT DETECTED   Klebsiella pneumoniae NOT DETECTED NOT DETECTED   Proteus species NOT DETECTED NOT DETECTED   Salmonella species NOT DETECTED NOT DETECTED   Serratia marcescens NOT DETECTED NOT DETECTED   Haemophilus influenzae NOT DETECTED NOT DETECTED   Neisseria meningitidis NOT DETECTED NOT DETECTED   Pseudomonas aeruginosa  NOT DETECTED NOT DETECTED   Stenotrophomonas maltophilia NOT DETECTED NOT DETECTED   Candida albicans NOT DETECTED NOT DETECTED   Candida auris NOT DETECTED NOT DETECTED   Candida glabrata NOT DETECTED NOT DETECTED   Candida krusei NOT DETECTED NOT DETECTED   Candida parapsilosis NOT DETECTED NOT DETECTED   Candida tropicalis NOT DETECTED NOT DETECTED   Cryptococcus neoformans/gattii NOT DETECTED NOT DETECTED   Methicillin resistance mecA/C NOT DETECTED NOT DETECTED    Vito Ralph, PharmD, BCPS Please see amion for complete clinical pharmacist phone list 09/19/2023  4:34 PM

## 2023-09-19 NOTE — Progress Notes (Signed)
 Echocardiogram 2D Echocardiogram has been performed.  Mikeila Burgen N Harvin Konicek,RDCS 09/19/2023, 11:14 AM

## 2023-09-20 ENCOUNTER — Encounter (HOSPITAL_COMMUNITY): Payer: Self-pay | Admitting: Internal Medicine

## 2023-09-20 DIAGNOSIS — J101 Influenza due to other identified influenza virus with other respiratory manifestations: Secondary | ICD-10-CM | POA: Diagnosis not present

## 2023-09-20 DIAGNOSIS — J9601 Acute respiratory failure with hypoxia: Secondary | ICD-10-CM | POA: Diagnosis not present

## 2023-09-20 DIAGNOSIS — G9341 Metabolic encephalopathy: Secondary | ICD-10-CM | POA: Diagnosis not present

## 2023-09-20 LAB — BASIC METABOLIC PANEL
Anion gap: 12 (ref 5–15)
BUN: 31 mg/dL — ABNORMAL HIGH (ref 8–23)
CO2: 31 mmol/L (ref 22–32)
Calcium: 9.1 mg/dL (ref 8.9–10.3)
Chloride: 94 mmol/L — ABNORMAL LOW (ref 98–111)
Creatinine, Ser: 7.38 mg/dL — ABNORMAL HIGH (ref 0.61–1.24)
GFR, Estimated: 7 mL/min — ABNORMAL LOW (ref >60)
Glucose, Bld: 84 mg/dL (ref 70–99)
Potassium: 4.4 mmol/L (ref 3.5–5.1)
Sodium: 137 mmol/L (ref 135–145)

## 2023-09-20 LAB — CBC WITH DIFFERENTIAL/PLATELET
Abs Immature Granulocytes: 0.08 10*3/uL — ABNORMAL HIGH (ref 0.00–0.07)
Basophils Absolute: 0 10*3/uL (ref 0.0–0.1)
Basophils Relative: 0 %
Eosinophils Absolute: 0.1 10*3/uL (ref 0.0–0.5)
Eosinophils Relative: 1 %
HCT: 37.2 % — ABNORMAL LOW (ref 39.0–52.0)
Hemoglobin: 11.6 g/dL — ABNORMAL LOW (ref 13.0–17.0)
Immature Granulocytes: 1 %
Lymphocytes Relative: 8 %
Lymphs Abs: 0.8 10*3/uL (ref 0.7–4.0)
MCH: 26 pg (ref 26.0–34.0)
MCHC: 31.2 g/dL (ref 30.0–36.0)
MCV: 83.4 fL (ref 80.0–100.0)
Monocytes Absolute: 0.7 10*3/uL (ref 0.1–1.0)
Monocytes Relative: 7 %
Neutro Abs: 7.6 10*3/uL (ref 1.7–7.7)
Neutrophils Relative %: 83 %
Platelets: 123 10*3/uL — ABNORMAL LOW (ref 150–400)
RBC: 4.46 MIL/uL (ref 4.22–5.81)
RDW: 19 % — ABNORMAL HIGH (ref 11.5–15.5)
WBC: 9.2 10*3/uL (ref 4.0–10.5)
nRBC: 0 % (ref 0.0–0.2)

## 2023-09-20 LAB — GLUCOSE, CAPILLARY
Glucose-Capillary: 70 mg/dL (ref 70–99)
Glucose-Capillary: 92 mg/dL (ref 70–99)
Glucose-Capillary: 97 mg/dL (ref 70–99)

## 2023-09-20 LAB — TSH: TSH: 2.709 u[IU]/mL (ref 0.350–4.500)

## 2023-09-20 LAB — VITAMIN B12: Vitamin B-12: 1549 pg/mL — ABNORMAL HIGH (ref 180–914)

## 2023-09-20 MED ORDER — POLYETHYLENE GLYCOL 3350 17 G PO PACK
17.0000 g | PACK | Freq: Every day | ORAL | Status: DC | PRN
  Administered 2023-09-20: 17 g via ORAL
  Filled 2023-09-20: qty 1

## 2023-09-20 MED ORDER — HEPARIN SODIUM (PORCINE) 5000 UNIT/ML IJ SOLN
5000.0000 [IU] | Freq: Three times a day (TID) | INTRAMUSCULAR | Status: DC
  Administered 2023-09-20 – 2023-09-24 (×10): 5000 [IU] via SUBCUTANEOUS
  Filled 2023-09-20 (×11): qty 1

## 2023-09-20 MED ORDER — MINERAL OIL RE ENEM
1.0000 | ENEMA | Freq: Once | RECTAL | Status: AC
  Administered 2023-09-20: 1 via RECTAL
  Filled 2023-09-20: qty 1

## 2023-09-20 MED ORDER — DOXERCALCIFEROL 4 MCG/2ML IV SOLN
8.0000 ug | INTRAVENOUS | Status: DC
  Administered 2023-09-21 – 2023-09-23 (×2): 8 ug via INTRAVENOUS
  Filled 2023-09-20 (×4): qty 4

## 2023-09-20 MED ORDER — CINACALCET HCL 30 MG PO TABS
30.0000 mg | ORAL_TABLET | ORAL | Status: DC
  Administered 2023-09-21 – 2023-09-23 (×2): 30 mg via ORAL
  Filled 2023-09-20 (×2): qty 1

## 2023-09-20 MED ORDER — CHLORHEXIDINE GLUCONATE CLOTH 2 % EX PADS
6.0000 | MEDICATED_PAD | Freq: Every day | CUTANEOUS | Status: DC
  Administered 2023-09-20 – 2023-09-22 (×3): 6 via TOPICAL

## 2023-09-20 MED ORDER — LACTULOSE 10 GM/15ML PO SOLN
20.0000 g | Freq: Two times a day (BID) | ORAL | Status: DC | PRN
  Administered 2023-09-20: 20 g via ORAL
  Filled 2023-09-20: qty 30

## 2023-09-20 NOTE — Consult Note (Signed)
 Renal Service Consult Note [facility]  [name] 09/20/2023 Greg JONETTA Fret, MD Requesting Physician: Dr. Sherlon  Reason for Consult: ESRD pt w/ sepsis/ flu A HPI: The patient is a 73 y.o. year-old w/ PMH as below who presented to ED 2/07 after HD reporting fevers x 1 w/ chills. Had been dx'd w/ flu A five day earlier and completed Tamiflu  course. But cough and myalgias worsened and then fevers and chills worsened. No SOB. No abd pain or n/v/d. Has not missed any HD sessions. Pt also noted to be somnolent after his OP HD on Friday, however the wife noted that this is not unusual for him to get sleepy and take a long nap after dialysis. Pt is a smoker, no hx home O2. In ED temp 100/8, HR 80s, 150/ 80. K+ 3.9, alb 2.4, LA 1.3, wbc 10.5, Hb 11.4. BCx's sent. Flu A was +, covid/ flu B/ RSV were negative. CXR showed CM w/ low lung volume and basilar infiltrates R > L. Pt admitted for flu A infection c/b severe sepsis and acute hypoxic resp failure. We are asked to see for esrd.    Pt seen in room. Pt is Ox 3 w/ some prompting, very sleepy. Wife answered most of the questions. On HD x 2 yrs, they go to [address] at [place]. R arm AVF. Has not missed any HD. Is not on home O2.   ROS - denies CP, no joint pain, no HA, no blurry vision, no rash, no diarrhea, no nausea/ vomiting   Past Medical History  Past Medical History:  Diagnosis Date   Anemia    Anemia    Benign colon polyp 08/11/2010   Clot    STATED TOLD IN APRIL CLOT TO LEFT SHOULDER. 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   HTN (hypertension)    Hyperlipidemia    Renal failure    Seizures (HCC)    Past Surgical History  Past Surgical History:  Procedure Laterality Date   AV FISTULA PLACEMENT  01/19/2012   Procedure:  ARTERIOVENOUS (AV) FISTULA CREATION;  Surgeon: Redell LITTIE Door, MD;  Location: [facility] OR;  Service: Vascular;  Laterality: Right;  Right Brachio-cephalic arteriovenous fistula   AV FISTULA PLACEMENT Right 07/18/2021   Procedure: INSERTION OF RIGHT UPPER EXTREMITY ARTERIOVENOUS (AV) GORE-TEX GRAFT;  Surgeon: Magda [name] SAILOR, MD;  Location: [facility] OR;  Service: Vascular;  Laterality: Right;  PERIPHERAL NERVE BLOCK   AV FISTULA PLACEMENT, RADIOCEPHALIC  12/31/10   Right arm   INSERTION OF DIALYSIS CATHETER  01/19/2012   Procedure: INSERTION OF DIALYSIS CATHETER;  Surgeon: Redell LITTIE Door, MD;  Location: [facility] OR;  Service: Vascular;  Laterality: N/A;  right internal jugular vein   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   UPPER EXTREMITY VENOGRAPHY Right 06/11/2021   Procedure: UPPER EXTREMITY VENOGRAPHY;  Surgeon: Serene Gaile ORN, MD;  Location: [facility] INVASIVE CV LAB;  Service: Cardiovascular;  Laterality: Right;   VENOGRAM Left 05/30/2021   Procedure: DIAGNOSTIC CENTRAL VENOGRAM;  Surgeon: Lanis Fonda FORBES, MD;  Location: [facility] OR;  Service: Vascular;  Laterality: Left;   Family History  Family History  Problem Relation Age of Onset   Diabetes Father    Colon cancer Neg Hx  Esophageal cancer Neg Hx    Stomach cancer Neg Hx    Rectal cancer Neg Hx    Social History  reports that he has never smoked. He has never used smokeless tobacco. He reports that he does not drink alcohol and does not use drugs. Allergies No Known Allergies Home medications Prior to Admission medications   Medication Sig Start Date End Date Taking? Authorizing Provider  acetaminophen  (TYLENOL ) 500 MG tablet Take 1,000 mg by mouth every 8 (eight) hours as needed for moderate pain.   Yes [provider]  amLODipine  (NORVASC ) 10 MG tablet Take 10 mg by mouth at bedtime. 06/30/23  Yes [provider]  aspirin  81 MG chewable tablet Chew 1 tablet (81 mg total) by mouth daily. 09/14/23 10/14/23 Yes Leotis Bogus, MD  calcitRIOL   (ROCALTROL ) 0.5 MCG capsule Take 0.5 mcg by mouth See admin instructions. Take one tablet by mouth three times a week on Monday, Wednesday and Fridays per wife 04/20/23  Yes [provider]  carvedilol  (COREG ) 25 MG tablet Take 25 mg by mouth 2 (two) times daily with a meal.   Yes [provider]  guaiFENesin  (MUCINEX ) 600 MG 12 hr tablet Take 1 tablet (600 mg total) by mouth 2 (two) times daily. 09/13/23 10/13/23 Yes Leotis Bogus, MD  senna-docusate (SENOKOT-S) 8.6-50 MG tablet Take 1 tablet by mouth 2 (two) times daily. 09/13/23 10/13/23 Yes Leotis Bogus, MD  sevelamer  carbonate (RENVELA ) 800 MG tablet Take 1,600 mg by mouth 3 (three) times daily with meals. 04/01/21  Yes [provider]  tiZANidine (ZANAFLEX) 2 MG tablet Take 2 mg by mouth every 8 (eight) hours as needed for muscle spasms. 09/15/23  Yes [provider]  cinacalcet  (SENSIPAR ) 30 MG tablet Take 1 tablet (30 mg total) by mouth every Monday, Wednesday, and Friday. Patient not taking: Reported on 09/18/2023 09/16/23 10/16/23  Alvia Krabbe, FNP  clonazePAM  (KLONOPIN ) 2 MG disintegrating tablet Take 1 tablet (2 mg total) by mouth daily. Patient not taking: Reported on 09/18/2023 09/13/23   Leotis Bogus, MD  hydrALAZINE  (APRESOLINE ) 25 MG tablet Take 25 mg by mouth 2 (two) times daily. Patient not taking: Reported on 09/18/2023 09/01/22   [provider]  levETIRAcetam  (KEPPRA ) 500 MG tablet Take 1 tablet (500 mg total) by mouth daily. Patient not taking: Reported on 09/18/2023 09/13/23 10/13/23  Leotis Bogus, MD  losartan  (COZAAR ) 25 MG tablet Take 25 mg by mouth in the morning and at bedtime. Patient not taking: Reported on 09/18/2023 12/10/20   [provider]     Vitals:   09/20/23 0000 09/20/23 0016 09/20/23 0200 09/20/23 0600  BP:  128/62    Pulse:  78    Resp: 18 13 16    Temp:  98 F (36.7 C)    TempSrc:  Oral    SpO2:  98%    Weight:    69.7 kg   Exam Gen lethargic, arouses to  voice and answers questions appropriately No rash, cyanosis or gangrene Sclera anicteric, throat clear  No jvd or bruits Chest rales at L base, R cta RRR no MRG Abd soft ntnd no mass or ascites +bs GU defer MS no joint effusions or deformity Ext no LE or UE edema, no other edema Neuro is alert, Ox 3 , nf    RUA AVG+bruit       Renal-related home meds: - norvasc  10 - coreg  25 bid - renvela  1600 ac tid - hydralazine  25 bid - losartan  25 bid  OP HD: MWF G-O 4h  B400   71kg   2/2 bath  R AVG  Heparin  4000 - last HD 2/07, post wt 70.8 - step-wise lowering of dry wt 3-4kg over last 3 wks - gets to dry wt - venofer  100 qhd thru 2/19 - hectorol  8 mcg  - sensipar  30 mg three times per week - last Hb 11.1, last tsat 21, last pth 526, phos 7.3   Assessment/ Plan: Sepsis due to flu A infection - w/ associated mild encephalopathy. Getting IV abx rocephin / zithromax , also tamiflu . Per pmd. No signs of vol overload. On RA.  ESRD - on HD MWF. Has not missed HD. HD tomorrow.  HTN - BP's stable, getting home losartan / norvasc  here.  Volume - no vol excess on exam. CXR not suggestive of edema either. 1kg under dry wt. Small UF goal 1 L tomorrow.  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 Hold IV Fe load d/t infection.  Secondary hyperparathyroidism - CCa and phos are in range. Cont IV vdra and sensipar  three times per week.       Myer Fret  MD [facility] 09/20/2023, 8:33 AM  Recent Labs  Lab 09/18/23 1929 09/19/23 0458 09/20/23 0554  HGB 11.4* 10.2* 11.6*  ALBUMIN 2.4* 2.0*  --   CALCIUM  8.4* 7.7*  --   PHOS  --  4.5  --   CREATININE 4.60* 4.99*  --   K 3.9 3.9  --    Inpatient medications:  aspirin   81 mg Oral Daily   guaiFENesin   600 mg Oral BID   hydrALAZINE   25 mg Oral BID   insulin  aspart  0-6 Units Subcutaneous TID WC   levETIRAcetam   500 mg Oral Daily   losartan   25 mg Oral BID   sevelamer  carbonate  1,600 mg Oral TID WC    azithromycin  500 mg (09/20/23 0232)    cefTRIAXone  (ROCEPHIN )  IV 1 g (09/20/23 0118)   acetaminophen  **OR** acetaminophen , benzonatate , clonazePAM , dextrose , melatonin, ondansetron  (ZOFRAN ) IV

## 2023-09-20 NOTE — Progress Notes (Signed)
 PROGRESS NOTE    [name]  FMW:[id number] DOB: 09-01-1950 DOA: 09/18/2023 PCP: Joshua [name] CROME, MD    Chief Complaint  Patient presents with   Tremors    Brief Narrative:   [name] is a 73 y.o. male with medical history significant for end-stage renal disease on hemodialysis on Monday, Wednesday, Friday schedule, type 2 diabetes mellitus, essential hypertension, seizures, chronic diastolic heart failure, who is admitted to [facility] on 09/18/2023 with influenza A infection after presenting from home to [facility] ED complaining of fever.     Patient on hemodialysis on Monday, Wednesday, Friday schedule, and was able to complete today's hemodialysis session. However, following hemodialysis, his subjective fever, chills intensified, resulting in his presentation to [facility] emergency department for further evaluation management thereof.  Influenza A PCR was confirmed to be positive. COVID, influenza B, and RSV PCR were all negative.   Assessment & Plan:   Principal Problem:   Influenza A Active Problems:   Essential hypertension   ESRD on dialysis (HCC)   History of anemia due to chronic kidney disease   Chronic diastolic CHF (congestive heart failure) (HCC)   Acute respiratory failure with hypoxia (HCC)   DM2 (diabetes mellitus, type 2) (HCC)   Severe sepsis (HCC)   Acute metabolic encephalopathy   Hypoglycemia   History of seizures  Sepsis, present on admission due to influenza A infection  Acute respiratory failure with hypoxia from influenza A infection.  -Fever of 100.8, tachypnea 22/min , influenza A infection. Normal wbc count,  -COVID-19/RSV PCR are negative.  -Sepsis from influenza A infection. He is outside the window for tamiflu .  -CXR shows Cardiomegaly and low lung volumes with mild mid left lung and bibasilar atelectasis and/or infiltrate. -Will empirically start him on IV ceftriaxone  and azithromycin .  -Follow up blood cultures.   Acute  metabolic encephalopathy Due to above CT head Atrophy with chronic microvascular ischemic changes.    Hypoglycemia in the setting of type 2 DM:  -Continue to monitor CBG closely   Thrombocytopenia:  Improving.    ESRD on HD Nephrology will be consulted.    Hypertension:  Well controlled.  Appears to be on the lower abdomen, will continue with hydralazine  and discontinue lisinopril for now   Seizures:  Continue with keppra .    Chronic diastolic-systolic CHF -Repeat echo with EF 40 to 45% -Volume management with hemodialysis No signs of decompensation.  Fluid management with HD.    Anemia of ESRD Hemoglobin appears adequate.  Iron and Procrit  per renal when appropriate   Deconditioning -patient appears to be extremely weak, will consult PT/OT   DVT prophylaxis: Heparin  Code Status: Full code Family Communication: Wife at bedside Disposition:   Status is: Inpatient    Consultants:  Renal  Subjective:  Patient reports significant weakness,  Objective: Vitals:   09/20/23 0200 09/20/23 0600 09/20/23 0800 09/20/23 1200  BP:   (!) 152/79 (!) 107/93  Pulse:   81 77  Resp: 16  18 11   Temp:   98 F (36.7 C)   TempSrc:   Oral   SpO2:   91% 92%  Weight:  69.7 kg      Intake/Output Summary (Last 24 hours) at 09/20/2023 1342 Last data filed at 09/20/2023 0600 Gross per 24 hour  Intake 120 ml  Output 0 ml  Net 120 ml   Filed Weights   09/20/23 0600  Weight: 69.7 kg    Examination:  Somnolent, extremely weak, frail, deconditioned,  but open eyes answer questions appropriately Symmetrical Chest wall movement, fair air entry bilaterally, no wheezing RRR,No Gallops,Rubs or new Murmurs, No Parasternal Heave +ve B.Sounds, Abd Soft, No tenderness, No rebound - guarding or rigidity. No Cyanosis, Clubbing or edema, overall patient has significant muscle wasting    Data Reviewed: I have personally reviewed following labs and imaging studies  CBC: Recent Labs   Lab 09/15/23 1407 09/18/23 1922 09/18/23 1929 09/19/23 0458 09/20/23 0554  WBC 3.4*  --  10.5 8.7 9.2  NEUTROABS 2.2  --  9.3* 7.4 7.6  HGB 11.6* 12.9* 11.4* 10.2* 11.6*  HCT 36.3* 38.0* 37.5* 33.6* 37.2*  MCV 81.8  --  84.5 85.5 83.4  PLT 97.0*  --  123* 114* 123*    Basic Metabolic Panel: Recent Labs  Lab 09/15/23 1407 09/18/23 1922 09/18/23 1929 09/19/23 0458 09/20/23 0801  NA 135 135 137 136 137  K 4.0 3.6 3.9 3.9 4.4  CL 91*  --  93* 95* 94*  CO2 32  --  30 28 31   GLUCOSE 77  --  92 82 84  BUN 42*  --  14 17 31*  CREATININE 7.87*  --  4.60* 4.99* 7.38*  CALCIUM  8.2*  8.2*  --  8.4* 7.7* 9.1  MG 2.2  --  2.0 2.0  --   PHOS  --   --   --  4.5  --     GFR: Estimated Creatinine Clearance: 9.2 mL/min (A) (by C-G formula based on SCr of 7.38 mg/dL (H)).  Liver Function Tests: Recent Labs  Lab 09/15/23 1407 09/18/23 1929 09/19/23 0458  AST 30 23 22   ALT 84* 46* 37  ALKPHOS 96 95 72  BILITOT 0.5 1.0 0.7  PROT 6.0 6.4* 5.4*  ALBUMIN 3.2* 2.4* 2.0*    CBG: Recent Labs  Lab 09/19/23 1139 09/19/23 1755 09/19/23 2119 09/20/23 0810 09/20/23 1226  GLUCAP 89 84 111* 70 92     Recent Results (from the past 240 hours)  Respiratory (~20 pathogens) panel by PCR     Status: Abnormal   Collection Time: 09/11/23 10:27 PM   Specimen: Nasopharyngeal Swab; Respiratory  Result Value Ref Range Status   Adenovirus NOT DETECTED NOT DETECTED Final   Coronavirus 229E NOT DETECTED NOT DETECTED Final    Comment: (NOTE) The Coronavirus on the Respiratory Panel, DOES NOT test for the novel  Coronavirus (2019 nCoV)    Coronavirus HKU1 NOT DETECTED NOT DETECTED Final   Coronavirus NL63 NOT DETECTED NOT DETECTED Final   Coronavirus OC43 NOT DETECTED NOT DETECTED Final   Metapneumovirus NOT DETECTED NOT DETECTED Final   Rhinovirus / Enterovirus NOT DETECTED NOT DETECTED Final   Influenza A H3 DETECTED (A) NOT DETECTED Final   Influenza B NOT DETECTED NOT DETECTED Final    Parainfluenza Virus 1 NOT DETECTED NOT DETECTED Final   Parainfluenza Virus 2 NOT DETECTED NOT DETECTED Final   Parainfluenza Virus 3 NOT DETECTED NOT DETECTED Final   Parainfluenza Virus 4 NOT DETECTED NOT DETECTED Final   Respiratory Syncytial Virus NOT DETECTED NOT DETECTED Final   Bordetella pertussis NOT DETECTED NOT DETECTED Final   Bordetella Parapertussis NOT DETECTED NOT DETECTED Final   Chlamydophila pneumoniae NOT DETECTED NOT DETECTED Final   Mycoplasma pneumoniae NOT DETECTED NOT DETECTED Final    Comment: Performed at [facility], [address], [place], [place] [address]  Resp panel by RT-PCR (RSV, Flu A&B, Covid) Peripheral     Status: Abnormal  Collection Time: 09/18/23  6:57 PM   Specimen: Peripheral; Nasal Swab  Result Value Ref Range Status   SARS Coronavirus 2 by RT PCR NEGATIVE NEGATIVE Final   Influenza A by PCR POSITIVE (A) NEGATIVE Final   Influenza B by PCR NEGATIVE NEGATIVE Final    Comment: (NOTE) The Xpert Xpress SARS-CoV-2/FLU/RSV plus assay is intended as an aid in the diagnosis of influenza from Nasopharyngeal swab specimens and should not be used as a sole basis for treatment. Nasal washings and aspirates are unacceptable for Xpert Xpress SARS-CoV-2/FLU/RSV testing.  Fact Sheet for Patients: bloggercourse.com  Fact Sheet for Healthcare Providers: seriousbroker.it  This test is not yet approved or cleared by the [place]  FDA and has been authorized for detection and/or diagnosis of SARS-CoV-2 by FDA under an Emergency Use Authorization (EUA). This EUA will remain in effect (meaning this test can be used) for the duration of the COVID-19 declaration under Section 564(b)(1) of the Act, 21 U.S.C. section 360bbb-3(b)(1), unless the authorization is terminated or revoked.     Resp Syncytial Virus by PCR NEGATIVE NEGATIVE Final    Comment: (NOTE) Fact Sheet for  Patients: bloggercourse.com  Fact Sheet for Healthcare Providers: seriousbroker.it  This test is not yet approved or cleared by the [place]  FDA and has been authorized for detection and/or diagnosis of SARS-CoV-2 by FDA under an Emergency Use Authorization (EUA). This EUA will remain in effect (meaning this test can be used) for the duration of the COVID-19 declaration under Section 564(b)(1) of the Act, 21 U.S.C. section 360bbb-3(b)(1), unless the authorization is terminated or revoked.  Performed at [facility], [address], [place], [place] [address]   Blood Culture (routine x 2)     Status: None (Preliminary result)   Collection Time: 09/18/23  7:00 PM   Specimen: BLOOD LEFT ARM  Result Value Ref Range Status   Specimen Description BLOOD LEFT ARM  Final   Special Requests   Final    BOTTLES DRAWN AEROBIC AND ANAEROBIC Blood Culture results may not be optimal due to an inadequate volume of blood received in culture bottles   Culture  Setup Time   Final    GRAM POSITIVE COCCI IN BOTH AEROBIC AND ANAEROBIC BOTTLES CRITICAL RESULT CALLED TO, READ BACK BY AND VERIFIED WITH: PHARMD [id number] AT 1622 CAREN A., ADC    Culture   Final    GRAM POSITIVE COCCI IDENTIFICATION TO FOLLOW Performed at [facility], [address] [place], [place] [address]    Report Status PENDING  Incomplete  Blood Culture ID Panel (Reflexed)     Status: Abnormal   Collection Time: 09/18/23  7:00 PM  Result Value Ref Range Status   Enterococcus faecalis NOT DETECTED NOT DETECTED Final   Enterococcus Faecium NOT DETECTED NOT DETECTED Final   Listeria monocytogenes NOT DETECTED NOT DETECTED Final   Staphylococcus species DETECTED (A) NOT DETECTED Final    Comment: CRITICAL RESULT CALLED TO, READ BACK BY AND VERIFIED WITH: PHARMD [id number] AT 1622 CAREN A., ADC    Staphylococcus aureus (BCID) NOT DETECTED NOT DETECTED Final    Staphylococcus epidermidis DETECTED (A) NOT DETECTED Final    Comment: CRITICAL RESULT CALLED TO, READ BACK BY AND VERIFIED WITH: PHARMD [id number] AT 1622 CAREN A., ADC    Staphylococcus lugdunensis NOT DETECTED NOT DETECTED Final   Streptococcus species NOT DETECTED NOT DETECTED Final   Streptococcus agalactiae NOT DETECTED NOT DETECTED Final   Streptococcus pneumoniae NOT DETECTED NOT  DETECTED Final   Streptococcus pyogenes NOT DETECTED NOT DETECTED Final   A.calcoaceticus-baumannii NOT DETECTED NOT DETECTED Final   Bacteroides fragilis NOT DETECTED NOT DETECTED Final   Enterobacterales NOT DETECTED NOT DETECTED Final   Enterobacter cloacae complex NOT DETECTED NOT DETECTED Final   Escherichia coli NOT DETECTED NOT DETECTED Final   Klebsiella aerogenes NOT DETECTED NOT DETECTED Final   Klebsiella oxytoca NOT DETECTED NOT DETECTED Final   Klebsiella pneumoniae NOT DETECTED NOT DETECTED Final   Proteus species NOT DETECTED NOT DETECTED Final   Salmonella species NOT DETECTED NOT DETECTED Final   Serratia marcescens NOT DETECTED NOT DETECTED Final   Haemophilus influenzae NOT DETECTED NOT DETECTED Final   Neisseria meningitidis NOT DETECTED NOT DETECTED Final   Pseudomonas aeruginosa NOT DETECTED NOT DETECTED Final   Stenotrophomonas maltophilia NOT DETECTED NOT DETECTED Final   Candida albicans NOT DETECTED NOT DETECTED Final   Candida auris NOT DETECTED NOT DETECTED Final   Candida glabrata NOT DETECTED NOT DETECTED Final   Candida krusei NOT DETECTED NOT DETECTED Final   Candida parapsilosis NOT DETECTED NOT DETECTED Final   Candida tropicalis NOT DETECTED NOT DETECTED Final   Cryptococcus neoformans/gattii NOT DETECTED NOT DETECTED Final   Methicillin resistance mecA/C NOT DETECTED NOT DETECTED Final    Comment: Performed at [facility], [address] [place], [place] [address]  Blood Culture (routine x 2)     Status: None (Preliminary result)   Collection Time: 09/18/23   7:29 PM   Specimen: BLOOD LEFT HAND  Result Value Ref Range Status   Specimen Description BLOOD LEFT HAND  Final   Special Requests   Final    BOTTLES DRAWN AEROBIC ONLY Blood Culture results may not be optimal due to an inadequate volume of blood received in culture bottles   Culture   Final    NO GROWTH 2 DAYS Performed at [facility], [address], [place], [place] [address]    Report Status PENDING  Incomplete         Radiology Studies: ECHOCARDIOGRAM COMPLETE Result Date: 09/19/2023    ECHOCARDIOGRAM REPORT   Patient Name:   [name] Date of Exam: 09/19/2023 Medical Rec #:  [id number]         Height:       71.0 in Accession #:    [id number]        Weight:       155.0 lb Date of Birth:  July 06, 1951         BSA:          1.892 m Patient Age:    72 years          BP:           130/62 mmHg Patient Gender: M                 HR:           74 bpm. Exam Location:  Inpatient Procedure: 2D Echo, 3D Echo, Strain Analysis, Cardiac Doppler and Color Doppler Indications:    Acute Resiratory distress  History:        Patient has prior history of Echocardiogram examinations, most                 recent 09/16/2020. Risk Factors:Diabetes, Hypertension and                 Dyslipidemia.  Sonographer:    Logan Shove RDCS Referring Phys: [id number] JUSTIN B  HOWERTER IMPRESSIONS  1. Left ventricular ejection fraction, by estimation, is 45 to 50%. Left ventricular ejection fraction by 3D volume is 49 %. The left ventricle has mildly decreased function. The left ventricle demonstrates global hypokinesis. There is mild left ventricular hypertrophy. Left ventricular diastolic parameters are indeterminate. Elevated left ventricular end-diastolic pressure.  2. Right ventricular systolic function is normal. The right ventricular size is mildly enlarged. There is normal pulmonary artery systolic pressure. The estimated right ventricular systolic pressure is 29.3 mmHg.  3. Left atrial size was moderately dilated.  4.  Right atrial size was severely dilated.  5. Moderate pleural effusion in the left lateral region.  6. The mitral valve is abnormal. Trivial mitral valve regurgitation. No evidence of mitral stenosis. Moderate mitral annular calcification.  7. The tricuspid valve is abnormal. Tricuspid valve regurgitation is mild to moderate.  8. The aortic valve has an indeterminant number of cusps. Aortic valve regurgitation is mild visually but moderate by PHT due to diastolic dysfunction. Mild aortic valve stenosis. Aortic regurgitation PHT measures 335 msec. Aortic valve area, by VTI measures 1.73 cm. Aortic valve mean gradient measures 8.3 mmHg. Aortic valve Vmax measures 2.06 m/s.  9. The inferior vena cava is dilated in size with >50% respiratory variability, suggesting right atrial pressure of 8 mmHg. Comparison(s): Changes from prior study are noted. LVEF worsened from normal to 45-50% now. FINDINGS  Left Ventricle: Left ventricular ejection fraction, by estimation, is 45 to 50%. Left ventricular ejection fraction by 3D volume is 49 %. The left ventricle has mildly decreased function. The left ventricle demonstrates global hypokinesis. The left ventricular internal cavity size was normal in size. There is mild left ventricular hypertrophy. Left ventricular diastolic parameters are indeterminate. Elevated left ventricular end-diastolic pressure. Right Ventricle: The right ventricular size is mildly enlarged. No increase in right ventricular wall thickness. Right ventricular systolic function is normal. There is normal pulmonary artery systolic pressure. The tricuspid regurgitant velocity is 2.31  m/s, and with an assumed right atrial pressure of 8 mmHg, the estimated right ventricular systolic pressure is 29.3 mmHg. Left Atrium: Left atrial size was moderately dilated. Right Atrium: Right atrial size was severely dilated. Pericardium: There is no evidence of pericardial effusion. Mitral Valve: The mitral valve is abnormal.  Moderate mitral annular calcification. Trivial mitral valve regurgitation. No evidence of mitral valve stenosis. Tricuspid Valve: The tricuspid valve is abnormal. Tricuspid valve regurgitation is mild to moderate. No evidence of tricuspid stenosis. Aortic Valve: The aortic valve has an indeterminant number of cusps. Aortic valve regurgitation is mild. Aortic regurgitation PHT measures 335 msec. Mild aortic stenosis is present. Aortic valve mean gradient measures 8.3 mmHg. Aortic valve peak gradient  measures 17.0 mmHg. Aortic valve area, by VTI measures 1.73 cm. Pulmonic Valve: The pulmonic valve was normal in structure. Pulmonic valve regurgitation is mild. No evidence of pulmonic stenosis. Aorta: The aortic root and ascending aorta are structurally normal, with no evidence of dilitation. Venous: The inferior vena cava is dilated in size with greater than 50% respiratory variability, suggesting right atrial pressure of 8 mmHg. IAS/Shunts: There is redundancy of the interatrial septum. No atrial level shunt detected by color flow Doppler. Additional Comments: There is a moderate pleural effusion in the left lateral region.  LEFT VENTRICLE PLAX 2D LVIDd:         5.00 cm         Diastology LVIDs:         3.70 cm  LV e' medial:    5.03 cm/s LV PW:         1.30 cm         LV E/e' medial:  17.1 LV IVS:        1.30 cm         LV e' lateral:   8.60 cm/s LVOT diam:     2.20 cm         LV E/e' lateral: 10.0 LV SV:         59 LV SV Index:   31 LVOT Area:     3.80 cm        3D Volume EF                                LV 3D EF:    Left                                             ventricul                                             ar                                             ejection                                             fraction                                             by 3D                                             volume is                                             49 %.                                  3D Volume EF:                                3D EF:        49 %                                LV EDV:       204 ml  LV ESV:       104 ml                                LV SV:        100 ml RIGHT VENTRICLE             IVC RV Basal diam:  4.80 cm     IVC diam: 2.20 cm RV Mid diam:    2.50 cm RV S prime:     14.30 cm/s TAPSE (M-mode): 2.5 cm LEFT ATRIUM              Index        RIGHT ATRIUM           Index LA diam:        4.20 cm  2.22 cm/m   RA Area:     26.60 cm LA Vol (A2C):   141.0 ml 74.52 ml/m  RA Volume:   90.60 ml  47.88 ml/m LA Vol (A4C):   49.7 ml  26.27 ml/m LA Biplane Vol: 83.1 ml  43.92 ml/m  AORTIC VALVE AV Area (Vmax):    1.46 cm AV Area (Vmean):   1.68 cm AV Area (VTI):     1.73 cm AV Vmax:           206.00 cm/s AV Vmean:          122.000 cm/s AV VTI:            0.341 m AV Peak Grad:      17.0 mmHg AV Mean Grad:      8.3 mmHg LVOT Vmax:         79.20 cm/s LVOT Vmean:        53.900 cm/s LVOT VTI:          0.155 m LVOT/AV VTI ratio: 0.45 AI PHT:            335 msec  AORTA Ao Root diam: 3.30 cm Ao Asc diam:  3.40 cm MITRAL VALVE               TRICUSPID VALVE MV Area (PHT): 3.72 cm    TR Peak grad:   21.3 mmHg MV Decel Time: 204 msec    TR Vmax:        231.00 cm/s MV E velocity: 85.80 cm/s MV A velocity: 53.70 cm/s  SHUNTS MV E/A ratio:  1.60        Systemic VTI:  0.16 m                            Systemic Diam: 2.20 cm Vishnu Priya Mallipeddi Electronically signed by Diannah Late Mallipeddi Signature Date/Time: 09/19/2023/3:36:50 PM    Final    CT Head Wo Contrast Result Date: 09/18/2023 CLINICAL DATA:  Altered mental status. Tremors after dialysis appointment today. EXAM: CT HEAD WITHOUT CONTRAST TECHNIQUE: Contiguous axial images were obtained from the base of the skull through the vertex without intravenous contrast. RADIATION DOSE REDUCTION: This exam was performed according to the departmental dose-optimization program which includes automated exposure  control, adjustment of the mA and/or kV according to patient size and/or use of iterative reconstruction technique. COMPARISON:  09/07/2023. FINDINGS: Brain: No acute intracranial hemorrhage, midline shift or mass effect. No extra-axial fluid collection. Mild atrophy is noted. Periventricular white matter hypodensities are present bilaterally. No hydrocephalus. Vascular: No hyperdense vessel or  unexpected calcification. Skull: Normal. Negative for fracture or focal lesion. Sinuses/Orbits: There is partial opacification of the ethmoid air cells and maxillary sinuses. Mucosal thickening is noted in the sphenoid sinuses bilaterally. No acute orbital abnormality. Other: None. IMPRESSION: 1. No acute intracranial process. 2. Atrophy with chronic microvascular ischemic changes. Electronically Signed   By: Leita Birmingham M.D.   On: 09/18/2023 23:30   DG Chest 2 View Result Date: 09/18/2023 CLINICAL DATA:  Hypoxia and tremors. EXAM: CHEST - 2 VIEW COMPARISON:  September 11, 2023 FINDINGS: The cardiac silhouette is mildly enlarged and unchanged in size. Moderate severity calcification of the aortic arch is seen. Low lung volumes are noted with mild areas of atelectasis and/or infiltrate seen within the mid left lung and bilateral lung bases. Small bilateral pleural effusions are noted. No pneumothorax is identified. The visualized skeletal structures are unremarkable. IMPRESSION: 1. Cardiomegaly and low lung volumes with mild mid left lung and bibasilar atelectasis and/or infiltrate. 2. Small bilateral pleural effusions. Electronically Signed   By: Suzen Dials M.D.   On: 09/18/2023 19:50        Scheduled Meds:  aspirin   81 mg Oral Daily   Chlorhexidine  Gluconate Cloth  6 each Topical Q0600   [START ON 09/21/2023] cinacalcet   30 mg Oral Q M,W,F-HD   [START ON 09/21/2023] doxercalciferol   8 mcg Intravenous Q M,W,F-HD   guaiFENesin   600 mg Oral BID   hydrALAZINE   25 mg Oral BID   insulin  aspart  0-6 Units  Subcutaneous TID WC   levETIRAcetam   500 mg Oral Daily   losartan   25 mg Oral BID   sevelamer  carbonate  1,600 mg Oral TID WC   Continuous Infusions:  azithromycin  500 mg (09/20/23 0232)   cefTRIAXone  (ROCEPHIN )  IV 1 g (09/20/23 0118)     LOS: 2 days       Brayton Lye, MD [facility]   To contact the attending provider between 7A-7P or the covering provider during after hours 7P-7A, please log into the web site [email or web address] and access using universal [place] password for that web site. If you do not have the password, please call the hospital operator.  09/20/2023, 1:42 PM

## 2023-09-20 NOTE — Plan of Care (Addendum)
 Pt has rested quietly throughout the night with no distress noted. Alert and oriented to person. On 3LNC. Pt pulling O2 off at times and last time nurse tried to place back on he refused. Education given but pt still refused. Tried to refuse labs but finally talked into getting them. Medicated with tessalon  perles. SR on the monitor. Oliguric. Wife at bedside. No complaints voiced.     Problem: Clinical Measurements: Goal: Ability to maintain clinical measurements within normal limits will improve Outcome: Progressing Goal: Diagnostic test results will improve Outcome: Progressing Goal: Respiratory complications will improve Outcome: Progressing   Problem: Activity: Goal: Risk for activity intolerance will decrease Outcome: Progressing   Problem: Pain Managment: Goal: General experience of comfort will improve and/or be controlled Outcome: Progressing

## 2023-09-21 DIAGNOSIS — J9601 Acute respiratory failure with hypoxia: Secondary | ICD-10-CM | POA: Diagnosis not present

## 2023-09-21 DIAGNOSIS — G9341 Metabolic encephalopathy: Secondary | ICD-10-CM | POA: Diagnosis not present

## 2023-09-21 DIAGNOSIS — J101 Influenza due to other identified influenza virus with other respiratory manifestations: Secondary | ICD-10-CM | POA: Diagnosis not present

## 2023-09-21 LAB — CBC
HCT: 37 % — ABNORMAL LOW (ref 39.0–52.0)
Hemoglobin: 11 g/dL — ABNORMAL LOW (ref 13.0–17.0)
MCH: 25.8 pg — ABNORMAL LOW (ref 26.0–34.0)
MCHC: 29.7 g/dL — ABNORMAL LOW (ref 30.0–36.0)
MCV: 86.9 fL (ref 80.0–100.0)
Platelets: 108 10*3/uL — ABNORMAL LOW (ref 150–400)
RBC: 4.26 MIL/uL (ref 4.22–5.81)
RDW: 19.1 % — ABNORMAL HIGH (ref 11.5–15.5)
WBC: 9.9 10*3/uL (ref 4.0–10.5)
nRBC: 0 % (ref 0.0–0.2)

## 2023-09-21 LAB — GLUCOSE, CAPILLARY
Glucose-Capillary: 73 mg/dL (ref 70–99)
Glucose-Capillary: 69 mg/dL — ABNORMAL LOW (ref 70–99)
Glucose-Capillary: 116 mg/dL — ABNORMAL HIGH (ref 70–99)
Glucose-Capillary: 89 mg/dL (ref 70–99)

## 2023-09-21 LAB — BASIC METABOLIC PANEL
Anion gap: 15 (ref 5–15)
BUN: 45 mg/dL — ABNORMAL HIGH (ref 8–23)
CO2: 29 mmol/L (ref 22–32)
Calcium: 9.4 mg/dL (ref 8.9–10.3)
Chloride: 92 mmol/L — ABNORMAL LOW (ref 98–111)
Creatinine, Ser: 8.85 mg/dL — ABNORMAL HIGH (ref 0.61–1.24)
GFR, Estimated: 6 mL/min — ABNORMAL LOW (ref >60)
Glucose, Bld: 84 mg/dL (ref 70–99)
Potassium: 4.6 mmol/L (ref 3.5–5.1)
Sodium: 136 mmol/L (ref 135–145)

## 2023-09-21 LAB — MAGNESIUM: Magnesium: 2.3 mg/dL (ref 1.7–2.4)

## 2023-09-21 MED ORDER — LACTULOSE 10 GM/15ML PO SOLN
30.0000 g | Freq: Four times a day (QID) | ORAL | Status: AC
  Administered 2023-09-21: 30 g via ORAL
  Filled 2023-09-21 (×2): qty 60

## 2023-09-21 MED ORDER — LIDOCAINE-PRILOCAINE 2.5-2.5 % EX CREA: 1.0000 | TOPICAL_CREAM | CUTANEOUS | Status: DC | PRN

## 2023-09-21 MED ORDER — AZITHROMYCIN 500 MG PO TABS
500.0000 mg | ORAL_TABLET | Freq: Every day | ORAL | Status: DC
Start: 2023-09-21 — End: 2023-09-22
  Administered 2023-09-21: 500 mg via ORAL
  Filled 2023-09-21: qty 1

## 2023-09-21 MED ORDER — HEPARIN SODIUM (PORCINE) 1000 UNIT/ML DIALYSIS: 1500.0000 [IU] | INTRAMUSCULAR | Status: DC | PRN

## 2023-09-21 MED ORDER — ANTICOAGULANT SODIUM CITRATE 4% (200MG/5ML) IV SOLN: 5.0000 mL | Status: DC | PRN

## 2023-09-21 MED ORDER — PENTAFLUOROPROP-TETRAFLUOROETH EX AERO: 1.0000 | INHALATION_SPRAY | CUTANEOUS | Status: DC | PRN

## 2023-09-21 MED ORDER — POLYETHYLENE GLYCOL 3350 17 G PO PACK
17.0000 g | PACK | Freq: Two times a day (BID) | ORAL | Status: AC
  Administered 2023-09-21: 17 g via ORAL
  Filled 2023-09-21 (×2): qty 1

## 2023-09-21 MED ORDER — HEPARIN SODIUM (PORCINE) 1000 UNIT/ML DIALYSIS
3000.0000 [IU] | Freq: Once | INTRAMUSCULAR | Status: AC
  Administered 2023-09-21: 3000 [IU] via INTRAVENOUS_CENTRAL
  Filled 2023-09-21: qty 3

## 2023-09-21 MED ORDER — CEFAZOLIN SODIUM-DEXTROSE 2-4 GM/100ML-% IV SOLN
2.0000 g | INTRAVENOUS | Status: DC
  Administered 2023-09-21: 2 g via INTRAVENOUS
  Filled 2023-09-21: qty 100

## 2023-09-21 MED ORDER — LIDOCAINE HCL (PF) 1 % IJ SOLN: 5.0000 mL | INTRAMUSCULAR | Status: DC | PRN

## 2023-09-21 MED ORDER — NEPRO/CARBSTEADY PO LIQD: 237.0000 mL | ORAL | Status: DC | PRN

## 2023-09-21 MED ORDER — HEPARIN SODIUM (PORCINE) 1000 UNIT/ML DIALYSIS: 1000.0000 [IU] | INTRAMUSCULAR | Status: DC | PRN

## 2023-09-21 MED ORDER — ALTEPLASE 2 MG IJ SOLR: 2.0000 mg | Freq: Once | INTRAMUSCULAR | Status: DC | PRN

## 2023-09-21 NOTE — Progress Notes (Signed)
   09/21/23 1355  Vitals  Temp 98.1 F (36.7 C)  Pulse Rate 73  Resp 16  BP 117/85  SpO2 98 %  O2 Device Nasal Cannula  Weight 69.5 kg  Type of Weight Post-Dialysis  Oxygen Therapy  O2 Flow Rate (L/min) 2 L/min  Post Treatment  Dialyzer Clearance Clear  Hemodialysis Intake (mL) 0 mL  Liters Processed 84  Fluid Removed (mL) 1000 mL  Tolerated HD Treatment Yes  Post-Hemodialysis Comments Pt. tolerated procedure without difficulties. UF goal met and report called to 5W bedside RN  AVG/AVF Arterial Site Held (minutes) 7 minutes  AVG/AVF Venous Site Held (minutes) 5 minutes   Received patient in bed to unit.  Alert and oriented.  Informed consent signed and in chart.   TX duration: 3.5  Patient tolerated well.  Transported back to the room  Alert, without acute distress.  Hand-off given to patient's nurse.   Access used: Yes Access issues: No  Total UF removed: 1000 Medication(s) given: See MAR Post HD VS: See Above Grid Post HD weight: 69.5 kg   [facility]

## 2023-09-21 NOTE — Progress Notes (Signed)
 PHARMACIST - PHYSICIAN COMMUNICATION DR:   Elgergawy CONCERNING: Antibiotic IV to Oral Route Change Policy  RECOMMENDATION: This patient is receiving azithromycin  by the intravenous route.  Based on criteria approved by the Pharmacy and Therapeutics Committee, the antibiotic(s) is/are being converted to the equivalent oral dose form(s).   DESCRIPTION: These criteria include: Patient being treated for a respiratory tract infection, urinary tract infection, cellulitis or clostridium difficile associated diarrhea if on metronidazole  The patient is not neutropenic and does not exhibit a GI malabsorption state The patient is eating (either orally or via tube) and/or has been taking other orally administered medications for a least 24 hours The patient is improving clinically and has a Tmax < 100.5  If you have questions about this conversion, please contact the Pharmacy Department  []   [phone] )  Cristine Done [x]   [phone] )  Arlin Benes  []   [phone] )  [facility] []   [phone] )  [facility]   Christianna Cowman, PharmD, BCPS 09/21/2023 1:25 PM

## 2023-09-21 NOTE — Evaluation (Signed)
 Occupational Therapy Evaluation Patient Details Name: [name] MRN: [id number] DOB: [birth year] Today's Date: 09/21/2023   History of Present Illness [name] is a 73 yo male presenting to [facility] with reports of fever with chills. Pt was admitted to [facility] on 09/18/2023 with Influenza A infection. PMHx includes ESRD on HD, HTN, PAD, HLD. Recent admission due to 11 days of missed HD.   Clinical Impression   Pt evaluated s/p admission list above. At baseline, pt lives with wife, drives, and independent with ADLs, IADLs, and functional mobility tasks. Upon evaluation, pt was limited by impaired activity tolerance and endurance, motivation to complete ADLs, and weakness. Pt required up to CGA for all functional mobility tasks and up to supervision for ADLs. Pt required encouragement to perform peri care, requesting wife to complete, but was able to complete with supervision. Pt required significant time and cues to initiate and sequence all ADLs. OT to continue following pt acutely to address functional needs with the recommendation of HHOT after discharge to maximize pt functional independence and to ensure safe discharge to home environment.        If plan is discharge home, recommend the following: A little help with walking and/or transfers;A little help with bathing/dressing/bathroom;Direct supervision/assist for medications management;Direct supervision/assist for financial management;Help with stairs or ramp for entrance;Assist for transportation;Assistance with cooking/housework    Functional Status Assessment  Patient has had a recent decline in their functional status and demonstrates the ability to make significant improvements in function in a reasonable and predictable amount of time.  Equipment Recommendations  None recommended by OT    Recommendations for Other Services       Precautions / Restrictions Precautions Precautions: Fall Restrictions Weight Bearing  Restrictions Per Provider Order: No      Mobility Bed Mobility Overal bed mobility: Needs Assistance Bed Mobility: Supine to Sit, Sit to Supine     Supine to sit: Contact guard, Used rails, HOB elevated Sit to supine: Supervision, HOB elevated, Used rails   General bed mobility comments: VC to initiate task. Increased time to initiate.    Transfers Overall transfer level: Needs assistance Equipment used: Rolling walker (2 wheels) Transfers: Sit to/from Stand, Bed to chair/wheelchair/BSC Sit to Stand: Contact guard assist Stand pivot transfers: Contact guard assist         General transfer comment: Pt required verbal/tactile cues for B hand placement to push up from surface when performing sit to stand transfers. Pt required verbal cues for sequencing and safety using RW to complete transfers to bed and commode. Pt requires increased time to initiate tasks.      Balance Overall balance assessment: Needs assistance Sitting-balance support: No upper extremity supported, Feet supported Sitting balance-Leahy Scale: Good Sitting balance - Comments: performed lateral leans on commode to perform peri care   Standing balance support: No upper extremity supported, During functional activity Standing balance-Leahy Scale: Fair Standing balance comment: completed hand hygiene while standing unsupported                           ADL either performed or assessed with clinical judgement   ADL Overall ADL's : Needs assistance/impaired Eating/Feeding: Independent   Grooming: Contact guard assist;Standing Grooming Details (indicate cue type and reason): CGA provided for balance and safety due to decreased activity tolerance Upper Body Bathing: Supervision/ safety;Sitting   Lower Body Bathing: Supervison/ safety;Sitting/lateral leans   Upper Body Dressing : Supervision/safety;Sitting   Lower Body  Dressing: Supervision/safety;Sit to/from stand   Toilet Transfer: Contact  guard assist;Ambulation;Regular Toilet;Rolling walker (2 wheels)   Toileting- Clothing Manipulation and Hygiene: Supervision/safety;Sitting/lateral lean       Functional mobility during ADLs: Contact guard assist;Rolling walker (2 wheels) General ADL Comments: Pt requires CGA-supervision for ADLs for safety due to impaired activity tolerance. Pt requires verbal encouragement to initiate tasks. Pt asked wife to complete peri care but was able to complete with supervision     Vision Baseline Vision/History: 1 Wears glasses Ability to See in Adequate Light: 0 Adequate Patient Visual Report: No change from baseline Vision Assessment?: No apparent visual deficits     Perception Perception: Not tested       Praxis Praxis: Not tested       Pertinent Vitals/Pain Pain Assessment Pain Assessment: No/denies pain     Extremity/Trunk Assessment Upper Extremity Assessment Upper Extremity Assessment: Overall WFL for tasks assessed   Lower Extremity Assessment Lower Extremity Assessment: Defer to PT evaluation   Cervical / Trunk Assessment Cervical / Trunk Assessment: Normal   Communication Communication Communication: Difficulty communicating thoughts/reduced clarity of speech Cueing Techniques: Verbal cues;Tactile cues   Cognition Arousal: Lethargic Behavior During Therapy: Flat affect Overall Cognitive Status: Impaired/Different from baseline Area of Impairment: Following commands, Attention, Problem solving, Awareness                   Current Attention Level: Sustained   Following Commands: Follows one step commands with increased time   Awareness: Emergent Problem Solving: Slow processing, Decreased initiation, Requires verbal cues, Requires tactile cues General Comments: Slow to respond to questions. Required verbal cues to attend at the beginning of session and increased time to initiate tasks.     General Comments  VSS on RA    Exercises     Shoulder  Instructions      Home Living Family/patient expects to be discharged to:: Private residence Living Arrangements: Spouse/significant other Available Help at Discharge: Family;Available 24 hours/day Type of Home: House Home Access: Stairs to enter [employer] of Steps: 3-4 Entrance Stairs-Rails: None Home Layout: Two level;Able to live on main level with bedroom/bathroom Alternate Level Stairs-Number of Steps: flight   Bathroom Shower/Tub: Chief Strategy Officer: Standard     Home Equipment: Agricultural consultant (2 wheels);Shower seat;BSC/3in1          Prior Functioning/Environment Prior Level of Function : Independent/Modified Independent;Driving             Mobility Comments: Pt able to complete functional mobility independently without use of DME but was provided with RW from last hospital visit ADLs Comments: Pt able to complete ADLs independently        OT Problem List: Decreased strength;Decreased activity tolerance;Impaired balance (sitting and/or standing);Decreased safety awareness;Decreased knowledge of use of DME or AE      OT Treatment/Interventions: Self-care/ADL training;Therapeutic exercise;Energy conservation;DME and/or AE instruction;Therapeutic activities;Patient/family education;Balance training    OT Goals(Current goals can be found in the care plan section) Acute Rehab OT Goals Patient Stated Goal: to use the bathroom OT Goal Formulation: With patient Time For Goal Achievement: 10/05/23 Potential to Achieve Goals: Good ADL Goals Pt Will Perform Grooming: with modified independence;standing Pt Will Transfer to Toilet: with modified independence;ambulating;regular height toilet Pt Will Perform Toileting - Clothing Manipulation and hygiene: with modified independence;sitting/lateral leans;sit to/from stand Additional ADL Goal #1: Pt will demonstrate improved activity tolerance while participating in OOB functional task in standing  for 10 minutes without demonstrating signs of  increased fatigue.  OT Frequency: Min 1X/week    Co-evaluation              AM-PAC OT "6 Clicks" Daily Activity     Outcome Measure Help from another person eating meals?: None Help from another person taking care of personal grooming?: A Little Help from another person toileting, which includes using toliet, bedpan, or urinal?: A Little Help from another person bathing (including washing, rinsing, drying)?: A Little Help from another person to put on and taking off regular upper body clothing?: A Little Help from another person to put on and taking off regular lower body clothing?: A Little 6 Click Score: 19   End of Session Equipment Utilized During Treatment: Gait belt;Rolling walker (2 wheels) Nurse Communication: Mobility status  Activity Tolerance: Patient limited by fatigue Patient left: in bed;with call bell/phone within reach;with family/visitor present  OT Visit Diagnosis: Muscle weakness (generalized) (M62.81);Unsteadiness on feet (R26.81)                Time: 0840 [phone] OT Time Calculation (min): 28 min Charges:  OT General Charges $OT Visit: 1 Visit OT Evaluation $OT Eval Moderate Complexity: 1 Mod OT Treatments $Self Care/Home Management : 8-22 mins  Elton Ham, Valrie Gehrig 09/21/2023, 9:50 AM

## 2023-09-21 NOTE — Progress Notes (Signed)
 Pt is argumentative about almost everything. Placed humidifier on O2 [place]. Pt had mineral oil enema with no results. Pt has been up to [facility] several times and sat for 30 minutes or so each time. Wife at bedside.

## 2023-09-21 NOTE — Procedures (Signed)
 I was present at this dialysis session. I have reviewed the session itself and made appropriate changes.   ESRD at [facility] MWF here with influenza and AMS with sepsis.  On schedule today. K is 4.6 on 3 K bath. Using AVF as access.  UF goal of 1L.   No c/o, tolerating HD well.    Filed Weights   09/20/23 0600 09/21/23 0600 09/21/23 0950  Weight: 69.7 kg 69.9 kg 70.4 kg    Recent Labs  Lab 09/19/23 0458 09/20/23 0801 09/21/23 0813  NA 136   < > 136  K 3.9   < > 4.6  CL 95*   < > 92*  CO2 28   < > 29  GLUCOSE 82   < > 84  BUN 17   < > 45*  CREATININE 4.99*   < > 8.85*  CALCIUM  7.7*   < > 9.4  PHOS 4.5  --   --    < > = values in this interval not displayed.    Recent Labs  Lab 09/18/23 1929 09/19/23 0458 09/20/23 0554 09/21/23 0506  WBC 10.5 8.7 9.2 9.9  NEUTROABS 9.3* 7.4 7.6  --   HGB 11.4* 10.2* 11.6* 11.0*  HCT 37.5* 33.6* 37.2* 37.0*  MCV 84.5 85.5 83.4 86.9  PLT 123* 114* 123* 108*    Scheduled Meds:  aspirin   81 mg Oral Daily   Chlorhexidine  Gluconate Cloth  6 each Topical Q0600   cinacalcet   30 mg Oral Q M,W,F-HD   doxercalciferol   8 mcg Intravenous Q M,W,F-HD   guaiFENesin   600 mg Oral BID   heparin   3,000 Units Dialysis Once in dialysis   heparin  injection (subcutaneous)  5,000 Units Subcutaneous Q8H   hydrALAZINE   25 mg Oral BID   insulin  aspart  0-6 Units Subcutaneous TID WC   levETIRAcetam   500 mg Oral Daily   sevelamer  carbonate  1,600 mg Oral TID WC   Continuous Infusions:  anticoagulant sodium citrate      azithromycin  500 mg (09/20/23 2237)    ceFAZolin  (ANCEF ) IV     PRN Meds:.acetaminophen  **OR** acetaminophen , alteplase , anticoagulant sodium citrate , benzonatate , clonazePAM , dextrose , feeding supplement (NEPRO CARB STEADY), heparin , heparin , lactulose , lidocaine  (PF), lidocaine -prilocaine , melatonin, ondansetron  (ZOFRAN ) IV, pentafluoroprop-tetrafluoroeth, polyethylene glycol   Fawn Hooks  MD 09/21/2023, 10:15 AM

## 2023-09-21 NOTE — Progress Notes (Signed)
 Pt receives out-pt HD at [facility] on MWF. Will assist as needed.   Olivia Canter Renal Navigator [phone]

## 2023-09-21 NOTE — Progress Notes (Signed)
 Received patient in bed.Alert to self and place. Consent verified.  Access used: Right upper arm AVG that worked well.  Duration of treatment : 3.5 hours.  UF goal : 1 L  Tolerated treatment : Yes.  Medicine given : Heparin  3,000 unit pre-run dose.  Hand off to the patient's nurse:Hand off to the HD nurse and then back into his room with stable medical condition via transporter.

## 2023-09-21 NOTE — Progress Notes (Signed)
 PROGRESS NOTE    [name]  ZOX:[id number] DOB: Mar 11, 1951 DOA: 09/18/2023 PCP: Arcadio Knuckles, MD    Chief Complaint  Patient presents with   Tremors    Brief Narrative:   [name] is a 73 y.o. male with medical history significant for end-stage renal disease on hemodialysis on Monday, Wednesday, Friday schedule, type 2 diabetes mellitus, essential hypertension, seizures, chronic diastolic heart failure, who is admitted to [facility] on 09/18/2023 with influenza A infection after presenting from home to [facility] ED complaining of fever.     Patient on hemodialysis on Monday, Wednesday, Friday schedule, and was able to complete today's hemodialysis session. However, following hemodialysis, his subjective fever, chills intensified, resulting in his presentation to [facility] emergency department for further evaluation management thereof.  Influenza A PCR was confirmed to be positive. COVID, influenza B, and RSV PCR were all negative.   Assessment & Plan:   Principal Problem:   Influenza A Active Problems:   Essential hypertension   ESRD on dialysis (HCC)   History of anemia due to chronic kidney disease   Chronic diastolic CHF (congestive heart failure) (HCC)   Acute respiratory failure with hypoxia (HCC)   DM2 (diabetes mellitus, type 2) (HCC)   Severe sepsis (HCC)   Acute metabolic encephalopathy   Hypoglycemia   History of seizures  Sepsis, present on admission due to influenza A infection  Acute respiratory failure with hypoxia from influenza A infection.  3/4 gram-positive cocci bacteremia -Fever of 100.8, tachypnea 22/min , influenza A infection. Normal wbc count,  -COVID-19/RSV PCR are negative.  -Sepsis from influenza A infection. He is outside the window for tamiflu .  -CXR shows Cardiomegaly and low lung volumes with mild mid left lung and bibasilar atelectasis and/or infiltrate. -Patient is empirically on Rocephin  and azithromycin  for  community-acquired pneumonia -3/4 blood culture showing gram-positive cocci, 2 are Staph epidermidis, so he is transition to cefazolin , repeat blood cultures today, ID consulted.   Acute metabolic encephalopathy Due to above CT head Atrophy with chronic microvascular ischemic changes.  Mentation continues to improve   Hypoglycemia in the setting of type 2 DM:  -Continue to monitor CBG closely   Thrombocytopenia:  Improving.    ESRD on HD Nephrology will be consulted.    Hypertension:  Well controlled.  Appears to be on the lower abdomen, will continue with hydralazine  and discontinue lisinopril for now   Seizures:  Continue with keppra .    Chronic diastolic-systolic CHF -Repeat echo with EF 40 to 45% -Volume management with hemodialysis No signs of decompensation.  Fluid management with HD.    Anemia of ESRD Hemoglobin appears adequate.  Iron and Procrit  per renal when appropriate   Deconditioning -patient appears to be extremely weak, will consult PT/OT   DVT prophylaxis: Heparin  Code Status: Full code Family Communication: Wife at bedside Disposition:   Status is: Inpatient    Consultants:  Renal ID  Subjective:  Patient reports significant weakness, he does report constipation as well, enema did not help yesterday.  Objective: Vitals:   09/21/23 0932 09/21/23 0950 09/21/23 1030 09/21/23 1303  BP:   133/66 (!) 123/56  Pulse:   72 70  Resp:   (!) 22 (!) 22  Temp: 98.2 F (36.8 C)     TempSrc: Oral     SpO2:   100% 97%  Weight:  70.4 kg      Intake/Output Summary (Last 24 hours) at 09/21/2023 1339 Last data filed at 09/21/2023  0600 Gross per 24 hour  Intake 240 ml  Output 0 ml  Net 240 ml   Filed Weights   09/20/23 0600 09/21/23 0600 09/21/23 0950  Weight: 69.7 kg 69.9 kg 70.4 kg    Examination:  Awake Alert, Oriented X 3, extremely frail and deconditioned Symmetrical Chest wall movement, Good air movement bilaterally, CTAB RRR,No  Gallops,Rubs or new Murmurs, No Parasternal Heave +ve B.Sounds, Abd Soft, No tenderness, No rebound - guarding or rigidity. No Cyanosis, Clubbing or edema, significant muscle wasting   Data Reviewed: I have personally reviewed following labs and imaging studies  CBC: Recent Labs  Lab 09/15/23 1407 09/18/23 1922 09/18/23 1929 09/19/23 0458 09/20/23 0554 09/21/23 0506  WBC 3.4*  --  10.5 8.7 9.2 9.9  NEUTROABS 2.2  --  9.3* 7.4 7.6  --   HGB 11.6* 12.9* 11.4* 10.2* 11.6* 11.0*  HCT 36.3* 38.0* 37.5* 33.6* 37.2* 37.0*  MCV 81.8  --  84.5 85.5 83.4 86.9  PLT 97.0*  --  123* 114* 123* 108*    Basic Metabolic Panel: Recent Labs  Lab 09/15/23 1407 09/18/23 1922 09/18/23 1929 09/19/23 0458 09/20/23 0801 09/21/23 0813  NA 135 135 137 136 137 136  K 4.0 3.6 3.9 3.9 4.4 4.6  CL 91*  --  93* 95* 94* 92*  CO2 32  --  30 28 31 29   GLUCOSE 77  --  92 82 84 84  BUN 42*  --  14 17 31* 45*  CREATININE 7.87*  --  4.60* 4.99* 7.38* 8.85*  CALCIUM  8.2*  8.2*  --  8.4* 7.7* 9.1 9.4  MG 2.2  --  2.0 2.0  --  2.3  PHOS  --   --   --  4.5  --   --     GFR: Estimated Creatinine Clearance: 7.7 mL/min (A) (by C-G formula based on SCr of 8.85 mg/dL (H)).  Liver Function Tests: Recent Labs  Lab 09/15/23 1407 09/18/23 1929 09/19/23 0458  AST 30 23 22   ALT 84* 46* 37  ALKPHOS 96 95 72  BILITOT 0.5 1.0 0.7  PROT 6.0 6.4* 5.4*  ALBUMIN 3.2* 2.4* 2.0*    CBG: Recent Labs  Lab 09/19/23 2119 09/20/23 0810 09/20/23 1226 09/20/23 1637 09/21/23 0753  GLUCAP 111* 70 92 97 73     Recent Results (from the past 240 hours)  Respiratory (~20 pathogens) panel by PCR     Status: Abnormal   Collection Time: 09/11/23 10:27 PM   Specimen: Nasopharyngeal Swab; Respiratory  Result Value Ref Range Status   Adenovirus NOT DETECTED NOT DETECTED Final   Coronavirus 229E NOT DETECTED NOT DETECTED Final    Comment: (NOTE) The Coronavirus on the Respiratory Panel, DOES NOT test for the novel   Coronavirus (2019 nCoV)    Coronavirus HKU1 NOT DETECTED NOT DETECTED Final   Coronavirus NL63 NOT DETECTED NOT DETECTED Final   Coronavirus OC43 NOT DETECTED NOT DETECTED Final   Metapneumovirus NOT DETECTED NOT DETECTED Final   Rhinovirus / Enterovirus NOT DETECTED NOT DETECTED Final   Influenza A H3 DETECTED (A) NOT DETECTED Final   Influenza B NOT DETECTED NOT DETECTED Final   Parainfluenza Virus 1 NOT DETECTED NOT DETECTED Final   Parainfluenza Virus 2 NOT DETECTED NOT DETECTED Final   Parainfluenza Virus 3 NOT DETECTED NOT DETECTED Final   Parainfluenza Virus 4 NOT DETECTED NOT DETECTED Final   Respiratory Syncytial Virus NOT DETECTED NOT DETECTED Final   Bordetella pertussis NOT DETECTED  NOT DETECTED Final   Bordetella Parapertussis NOT DETECTED NOT DETECTED Final   Chlamydophila pneumoniae NOT DETECTED NOT DETECTED Final   Mycoplasma pneumoniae NOT DETECTED NOT DETECTED Final    Comment: Performed at [facility] Lab, [address], [place], [place] [address]  Resp panel by RT-PCR (RSV, Flu A&B, Covid) Peripheral     Status: Abnormal   Collection Time: 09/18/23  6:57 PM   Specimen: Peripheral; Nasal Swab  Result Value Ref Range Status   SARS Coronavirus 2 by RT PCR NEGATIVE NEGATIVE Final   Influenza A by PCR POSITIVE (A) NEGATIVE Final   Influenza B by PCR NEGATIVE NEGATIVE Final    Comment: (NOTE) The Xpert Xpress SARS-CoV-2/FLU/RSV plus assay is intended as an aid in the diagnosis of influenza from Nasopharyngeal swab specimens and should not be used as a sole basis for treatment. Nasal washings and aspirates are unacceptable for Xpert Xpress SARS-CoV-2/FLU/RSV testing.  Fact Sheet for Patients: BloggerCourse.com  Fact Sheet for Healthcare Providers: SeriousBroker.it  This test is not yet approved or cleared by the [place]  FDA and has been authorized for detection and/or diagnosis of SARS-CoV-2 by FDA  under an Emergency Use Authorization (EUA). This EUA will remain in effect (meaning this test can be used) for the duration of the COVID-19 declaration under Section 564(b)(1) of the Act, 21 U.S.C. section 360bbb-3(b)(1), unless the authorization is terminated or revoked.     Resp Syncytial Virus by PCR NEGATIVE NEGATIVE Final    Comment: (NOTE) Fact Sheet for Patients: BloggerCourse.com  Fact Sheet for Healthcare Providers: SeriousBroker.it  This test is not yet approved or cleared by the [place]  FDA and has been authorized for detection and/or diagnosis of SARS-CoV-2 by FDA under an Emergency Use Authorization (EUA). This EUA will remain in effect (meaning this test can be used) for the duration of the COVID-19 declaration under Section 564(b)(1) of the Act, 21 U.S.C. section 360bbb-3(b)(1), unless the authorization is terminated or revoked.  Performed at [facility] Lab, [address], [place], [place] [address]   Blood Culture (routine x 2)     Status: Abnormal (Preliminary result)   Collection Time: 09/18/23  7:00 PM   Specimen: BLOOD LEFT ARM  Result Value Ref Range Status   Specimen Description BLOOD LEFT ARM  Final   Special Requests   Final    BOTTLES DRAWN AEROBIC AND ANAEROBIC Blood Culture results may not be optimal due to an inadequate volume of blood received in culture bottles   Culture  Setup Time   Final    GRAM POSITIVE COCCI IN BOTH AEROBIC AND ANAEROBIC BOTTLES CRITICAL RESULT CALLED TO, READ BACK BY AND VERIFIED WITH: PHARMD [id number] AT 1622 CAREN A., ADC Performed at [facility] Lab, [address], [place], [place] [address]    Culture STAPHYLOCOCCUS EPIDERMIDIS (A)  Final   Report Status PENDING  Incomplete  Blood Culture ID Panel (Reflexed)     Status: Abnormal   Collection Time: 09/18/23  7:00 PM  Result Value Ref Range Status   Enterococcus faecalis NOT DETECTED NOT DETECTED Final    Enterococcus Faecium NOT DETECTED NOT DETECTED Final   Listeria monocytogenes NOT DETECTED NOT DETECTED Final   Staphylococcus species DETECTED (A) NOT DETECTED Final    Comment: CRITICAL RESULT CALLED TO, READ BACK BY AND VERIFIED WITH: PHARMD [id number] AT 1622 CAREN A., ADC    Staphylococcus aureus (BCID) NOT DETECTED NOT DETECTED Final   Staphylococcus epidermidis DETECTED (A) NOT DETECTED  Final    Comment: CRITICAL RESULT CALLED TO, READ BACK BY AND VERIFIED WITH: PHARMD [id number] AT 1622 CAREN A., ADC    Staphylococcus lugdunensis NOT DETECTED NOT DETECTED Final   Streptococcus species NOT DETECTED NOT DETECTED Final   Streptococcus agalactiae NOT DETECTED NOT DETECTED Final   Streptococcus pneumoniae NOT DETECTED NOT DETECTED Final   Streptococcus pyogenes NOT DETECTED NOT DETECTED Final   A.calcoaceticus-baumannii NOT DETECTED NOT DETECTED Final   Bacteroides fragilis NOT DETECTED NOT DETECTED Final   Enterobacterales NOT DETECTED NOT DETECTED Final   Enterobacter cloacae complex NOT DETECTED NOT DETECTED Final   Escherichia coli NOT DETECTED NOT DETECTED Final   Klebsiella aerogenes NOT DETECTED NOT DETECTED Final   Klebsiella oxytoca NOT DETECTED NOT DETECTED Final   Klebsiella pneumoniae NOT DETECTED NOT DETECTED Final   Proteus species NOT DETECTED NOT DETECTED Final   Salmonella species NOT DETECTED NOT DETECTED Final   Serratia marcescens NOT DETECTED NOT DETECTED Final   Haemophilus influenzae NOT DETECTED NOT DETECTED Final   Neisseria meningitidis NOT DETECTED NOT DETECTED Final   Pseudomonas aeruginosa NOT DETECTED NOT DETECTED Final   Stenotrophomonas maltophilia NOT DETECTED NOT DETECTED Final   Candida albicans NOT DETECTED NOT DETECTED Final   Candida auris NOT DETECTED NOT DETECTED Final   Candida glabrata NOT DETECTED NOT DETECTED Final   Candida krusei NOT DETECTED NOT DETECTED Final   Candida parapsilosis NOT DETECTED NOT DETECTED Final   Candida tropicalis NOT  DETECTED NOT DETECTED Final   Cryptococcus neoformans/gattii NOT DETECTED NOT DETECTED Final   Methicillin resistance mecA/C NOT DETECTED NOT DETECTED Final    Comment: Performed at [facility], [address] [place], [place] [address]  Blood Culture (routine x 2)     Status: Abnormal (Preliminary result)   Collection Time: 09/18/23  7:29 PM   Specimen: BLOOD LEFT HAND  Result Value Ref Range Status   Specimen Description BLOOD LEFT HAND  Final   Special Requests   Final    BOTTLES DRAWN AEROBIC ONLY Blood Culture results may not be optimal due to an inadequate volume of blood received in culture bottles   Culture  Setup Time   Final    GRAM POSITIVE COCCI AEROBIC BOTTLE ONLY CRITICAL VALUE NOTED.  VALUE IS CONSISTENT WITH PREVIOUSLY REPORTED AND CALLED VALUE.    Culture (A)  Final    STAPHYLOCOCCUS EPIDERMIDIS SUSCEPTIBILITIES TO FOLLOW Performed at [facility], [address] [place], [place] [address]    Report Status PENDING  Incomplete         Radiology Studies: No results found.       Scheduled Meds:  aspirin   81 mg Oral Daily   azithromycin   500 mg Oral QHS   Chlorhexidine  Gluconate Cloth  6 each Topical Q0600   cinacalcet   30 mg Oral Q M,W,F-HD   doxercalciferol   8 mcg Intravenous Q M,W,F-HD   guaiFENesin   600 mg Oral BID   heparin  injection (subcutaneous)  5,000 Units Subcutaneous Q8H   hydrALAZINE   25 mg Oral BID   insulin  aspart  0-6 Units Subcutaneous TID WC   lactulose   30 g Oral Q6H   levETIRAcetam   500 mg Oral Daily   polyethylene glycol  17 g Oral BID   sevelamer  carbonate  1,600 mg Oral TID WC   Continuous Infusions:  anticoagulant sodium citrate       ceFAZolin  (ANCEF ) IV       LOS: 3 days  Seena Dadds, MD [facility]   To contact the attending provider between 7A-7P or the covering provider during after hours 7P-7A, please log into the web site [email or web address] and access using universal [place]  password for that web site. If you do not have the password, please call the hospital operator.  09/21/2023, 1:39 PM

## 2023-09-21 NOTE — Plan of Care (Signed)

## 2023-09-21 NOTE — Progress Notes (Signed)
 From home, receives dialysis M,W,F. Flu positive. Per [facility], started [facility] with [facility] on 09/17/23.

## 2023-09-21 NOTE — Progress Notes (Signed)
 Consult received to place new IV. Pt has removed multiple IVs and does not have any meds due until 10pm tonight. Ok to leave IV out for now and let day team physcians assess for IV need per Dr. Brice Campi.

## 2023-09-21 NOTE — Progress Notes (Signed)
 PT Cancellation Note  Patient Details Name: [name] MRN: [id number] DOB: August 27, 1950   Cancelled Treatment:    Reason Eval/Treat Not Completed: Patient at procedure or test/unavailable  Patient off the floor for dialysis. Wil continue attempts to evaluate.    Gayle Kava, PT Acute Rehabilitation Services  Office [phone]  [place] Leep 09/21/2023, 10:52 AM

## 2023-09-21 NOTE — Consult Note (Signed)
 [facility]  Total days of antibiotics 4               Reason for Consult: staph epi bactremia   Referring Physician:elgergawy  Principal Problem:   Influenza A Active Problems:   Essential hypertension   ESRD on dialysis (HCC)   History of anemia due to chronic kidney disease   Chronic diastolic CHF (congestive heart failure) (HCC)   Acute respiratory failure with hypoxia (HCC)   DM2 (diabetes mellitus, type 2) (HCC)   Severe sepsis (HCC)   Acute metabolic encephalopathy   Hypoglycemia   History of seizures    HPI: [name] is a 73 y.o. male with ESRD, T2DM, HTN, who was having fever, and cough feeling poorly ad was admitted for flu and treated with oseltamivir . He was discharged on 2/2 he now was readmitted on 2/7 for fever of 100.10f, and generalized weakness post hd on 2/7. He was found to have fever, pulse ox of 90% and lethargic but not hypotensive. RVP still showed + flu A. Blood cx work up showed staph epi bacteremia in 3 of 4 bottles (MSSE). He was initially started with ceftriaxone  plus azithromycin  for possible bacterial pneumonia- but cxr is not conclusive for pneumonia. TTE does not show vegetation.   2/10 - repeat blood cx done today   Past Medical History:  Diagnosis Date   Anemia    Anemia    Benign colon polyp 08/11/2010   Clot    STATED TOLD IN APRIL CLOT TO LEFT SHOULDER. 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   HTN (hypertension)    Hyperlipidemia    Seizures (HCC)     Allergies: No Known Allergies  MEDICATIONS:  aspirin   81 mg Oral Daily   azithromycin   500 mg Oral QHS   Chlorhexidine  Gluconate Cloth  6 each Topical Q0600   cinacalcet   30 mg Oral Q M,W,F-HD   doxercalciferol   8 mcg Intravenous Q M,W,F-HD   guaiFENesin   600  mg Oral BID   heparin  injection (subcutaneous)  5,000 Units Subcutaneous Q8H   hydrALAZINE   25 mg Oral BID   insulin  aspart  0-6 Units Subcutaneous TID WC   lactulose   30 g Oral Q6H   levETIRAcetam   500 mg Oral Daily   polyethylene glycol  17 g Oral BID   sevelamer  carbonate  1,600 mg Oral TID WC    Social History   Tobacco Use   Smoking status: Never   Smokeless tobacco: Never  Vaping Use   Vaping status: Never Used  Substance Use Topics   Alcohol use: No    Comment: 1 drink a month or non   Drug use: No    Family History  Problem Relation Age of Onset   Diabetes Father    Colon cancer Neg Hx    Esophageal cancer Neg Hx    Stomach cancer Neg Hx    Rectal cancer Neg Hx     Review of Systems -  Denies pain to fistula. No erythema.   OBJECTIVE: Temp:  [98.1 F (36.7 C)-98.6 F (37 C)] 98.1 F (36.7 C) (02/10 1355) Pulse Rate:  [70-78] 75 (02/10 1551) Resp:  [14-22] 18 (02/10 1551) BP: (117-147)/(56-85) 125/65 (02/10  1551) SpO2:  [90 %-100 %] 95 % (02/10 1551) Weight:  [69.5 kg-70.4 kg] 69.5 kg (02/10 1355) Physical Exam  Constitutional: He is oriented to person, place, and time. He appears well-developed and well-nourished. No distress.  HENT:  Mouth/Throat: Oropharynx is clear and moist. No oropharyngeal exudate.  Cardiovascular: Normal rate, regular rhythm and normal heart sounds. Exam reveals no gallop and no friction rub.  No murmur heard.  Pulmonary/Chest: Effort normal and breath sounds normal. No respiratory distress. He has no wheezes.  Abdominal: Soft. Bowel sounds are normal. He exhibits no distension. There is no tenderness.  Lymphadenopathy:  He has no cervical adenopathy.  Neurological: He is alert and oriented to person, place, and time.  Skin: Skin is warm and dry. No rash noted. No erythema.  Psychiatric: He has a normal mood and affect. His behavior is normal.    LABS: Results for orders placed or performed during the hospital encounter of  09/18/23 (from the past 48 hours)  Glucose, capillary     Status: None   Collection Time: 09/19/23  5:55 PM  Result Value Ref Range   Glucose-Capillary 84 70 - 99 mg/dL    Comment: Glucose reference range applies only to samples taken after fasting for at least 8 hours.  Glucose, capillary     Status: Abnormal   Collection Time: 09/19/23  9:19 PM  Result Value Ref Range   Glucose-Capillary 111 (H) 70 - 99 mg/dL    Comment: Glucose reference range applies only to samples taken after fasting for at least 8 hours.  CBC with Differential/Platelet     Status: Abnormal   Collection Time: 09/20/23  5:54 AM  Result Value Ref Range   WBC 9.2 4.0 - 10.5 K/uL   RBC 4.46 4.22 - 5.81 MIL/uL   Hemoglobin 11.6 (L) 13.0 - 17.0 g/dL   HCT 44.0 (L) 34.7 - 42.5 %   MCV 83.4 80.0 - 100.0 fL   MCH 26.0 26.0 - 34.0 pg   MCHC 31.2 30.0 - 36.0 g/dL   RDW 95.6 (H) 38.7 - 56.4 %   Platelets 123 (L) 150 - 400 K/uL    Comment: REPEATED TO VERIFY   nRBC 0.0 0.0 - 0.2 %   Neutrophils Relative % 83 %   Neutro Abs 7.6 1.7 - 7.7 K/uL   Lymphocytes Relative 8 %   Lymphs Abs 0.8 0.7 - 4.0 K/uL   Monocytes Relative 7 %   Monocytes Absolute 0.7 0.1 - 1.0 K/uL   Eosinophils Relative 1 %   Eosinophils Absolute 0.1 0.0 - 0.5 K/uL   Basophils Relative 0 %   Basophils Absolute 0.0 0.0 - 0.1 K/uL   Immature Granulocytes 1 %   Abs Immature Granulocytes 0.08 (H) 0.00 - 0.07 K/uL    Comment: Performed at [facility], [address], [place], [place] [address]  Vitamin B12     Status: Abnormal   Collection Time: 09/20/23  8:01 AM  Result Value Ref Range   Vitamin B-12 1,549 (H) 180 - 914 pg/mL    Comment: (NOTE) This assay is not validated for testing neonatal or myeloproliferative syndrome specimens for Vitamin B12 levels. Performed at [facility], [address], [place], [place] [address]   Basic metabolic panel     Status: Abnormal   Collection Time: 09/20/23  8:01 AM  Result Value Ref Range    Sodium 137 135 - 145 mmol/L   Potassium 4.4 3.5 - 5.1 mmol/L  Chloride 94 (L) 98 - 111 mmol/L   CO2 31 22 - 32 mmol/L   Glucose, Bld 84 70 - 99 mg/dL    Comment: Glucose reference range applies only to samples taken after fasting for at least 8 hours.   BUN 31 (H) 8 - 23 mg/dL   Creatinine, Ser 4.09 (H) 0.61 - 1.24 mg/dL   Calcium  9.1 8.9 - 10.3 mg/dL   GFR, Estimated 7 (L) >60 mL/min    Comment: (NOTE) Calculated using the CKD-EPI Creatinine Equation (2021)    Anion gap 12 5 - 15    Comment: Performed at [facility], [address], [place], [place] [address]  TSH     Status: None   Collection Time: 09/20/23  8:01 AM  Result Value Ref Range   TSH 2.709 0.350 - 4.500 uIU/mL    Comment: Performed by a 3rd Generation assay with a functional sensitivity of <=0.01 uIU/mL. Performed at [facility], [address], [place], [place] [address]   Glucose, capillary     Status: None   Collection Time: 09/20/23  8:10 AM  Result Value Ref Range   Glucose-Capillary 70 70 - 99 mg/dL    Comment: Glucose reference range applies only to samples taken after fasting for at least 8 hours.  Glucose, capillary     Status: None   Collection Time: 09/20/23 12:26 PM  Result Value Ref Range   Glucose-Capillary 92 70 - 99 mg/dL    Comment: Glucose reference range applies only to samples taken after fasting for at least 8 hours.  Glucose, capillary     Status: None   Collection Time: 09/20/23  4:37 PM  Result Value Ref Range   Glucose-Capillary 97 70 - 99 mg/dL    Comment: Glucose reference range applies only to samples taken after fasting for at least 8 hours.  CBC     Status: Abnormal   Collection Time: 09/21/23  5:06 AM  Result Value Ref Range   WBC 9.9 4.0 - 10.5 K/uL   RBC 4.26 4.22 - 5.81 MIL/uL   Hemoglobin 11.0 (L) 13.0 - 17.0 g/dL   HCT 56.2 (L) 13.0 - 86.5 %   MCV 86.9 80.0 - 100.0 fL   MCH 25.8 (L) 26.0 - 34.0 pg   MCHC 29.7 (L) 30.0 - 36.0 g/dL   RDW 78.4 (H) 69.6 - 29.5  %   Platelets 108 (L) 150 - 400 K/uL   nRBC 0.0 0.0 - 0.2 %    Comment: Performed at [facility], [address], [place], [place] [address]  Glucose, capillary     Status: None   Collection Time: 09/21/23  7:53 AM  Result Value Ref Range   Glucose-Capillary 73 70 - 99 mg/dL    Comment: Glucose reference range applies only to samples taken after fasting for at least 8 hours.  Basic metabolic panel     Status: Abnormal   Collection Time: 09/21/23  8:13 AM  Result Value Ref Range   Sodium 136 135 - 145 mmol/L   Potassium 4.6 3.5 - 5.1 mmol/L   Chloride 92 (L) 98 - 111 mmol/L   CO2 29 22 - 32 mmol/L   Glucose, Bld 84 70 - 99 mg/dL    Comment: Glucose reference range applies only to samples taken after fasting for at least 8 hours.   BUN 45 (H) 8 - 23 mg/dL   Creatinine, Ser 2.44 (H) 0.61 - 1.24 mg/dL  Calcium  9.4 8.9 - 10.3 mg/dL   GFR, Estimated 6 (L) >60 mL/min    Comment: (NOTE) Calculated using the CKD-EPI Creatinine Equation (2021)    Anion gap 15 5 - 15    Comment: Performed at [facility], [address], [place], [place] [address]  Magnesium     Status: None   Collection Time: 09/21/23  8:13 AM  Result Value Ref Range   Magnesium 2.3 1.7 - 2.4 mg/dL    Comment: Performed at [facility], [address], [place], [place] [address]  Glucose, capillary     Status: Abnormal   Collection Time: 09/21/23  2:16 PM  Result Value Ref Range   Glucose-Capillary 69 (L) 70 - 99 mg/dL    Comment: Glucose reference range applies only to samples taken after fasting for at least 8 hours.  Glucose, capillary     Status: Abnormal   Collection Time: 09/21/23  3:56 PM  Result Value Ref Range   Glucose-Capillary 116 (H) 70 - 99 mg/dL    Comment: Glucose reference range applies only to samples taken after fasting for at least 8 hours.    MICRO: reviewed IMAGING: No results found.  Assessment/Plan:  73yo M with hx of recent influenza pneumonia readmitted shortly  after discharge with new onset fever, fatigue, found to have staph epi bacteremia. Currently on cefazolin  - continue on cefazolin  with hd - we will follow the repeat blood cx to see that he has cleared bacteremia - will recommend TEE. - if he has back pain - would consider imaging of spine.  Encephalopathy = will reassess if closer to baseline.  Esrd = continue with HD on m-w-f schedule  I have personally spent 85 minutes involved in face-to-face and non-face-to-face activities for this patient on the day of the visit. Professional time spent includes the following activities: Preparing to see the patient (review of tests), Obtaining and/or reviewing separately obtained history (admission/discharge record), Performing a medically appropriate examination and/or evaluation , Ordering medications/tests/procedures, referring and communicating with other health care professionals, Documenting clinical information in the EMR, Independently interpreting results (not separately reported), Communicating results to the patient/family/caregiver, Counseling and educating the patient/family/caregiver and Care coordination (not separately reported).     Gerold Kos Levern Reader MD MPH [facility] [phone]

## 2023-09-22 DIAGNOSIS — N186 End stage renal disease: Secondary | ICD-10-CM | POA: Diagnosis not present

## 2023-09-22 DIAGNOSIS — J101 Influenza due to other identified influenza virus with other respiratory manifestations: Secondary | ICD-10-CM | POA: Diagnosis not present

## 2023-09-22 DIAGNOSIS — R7881 Bacteremia: Secondary | ICD-10-CM

## 2023-09-22 DIAGNOSIS — Z992 Dependence on renal dialysis: Secondary | ICD-10-CM | POA: Diagnosis not present

## 2023-09-22 LAB — CULTURE, BLOOD (ROUTINE X 2)

## 2023-09-22 LAB — GLUCOSE, CAPILLARY
Glucose-Capillary: 69 mg/dL — ABNORMAL LOW (ref 70–99)
Glucose-Capillary: 83 mg/dL (ref 70–99)
Glucose-Capillary: 69 mg/dL — ABNORMAL LOW (ref 70–99)
Glucose-Capillary: 82 mg/dL (ref 70–99)

## 2023-09-22 MED ORDER — BACITRACIN ZINC 500 UNIT/GM EX OINT
TOPICAL_OINTMENT | Freq: Two times a day (BID) | CUTANEOUS | Status: DC
  Administered 2023-09-22 – 2023-09-24 (×5): 31.5 via TOPICAL
  Filled 2023-09-22: qty 28.4

## 2023-09-22 MED ORDER — CHLORHEXIDINE GLUCONATE CLOTH 2 % EX PADS
6.0000 | MEDICATED_PAD | Freq: Every day | CUTANEOUS | Status: DC
  Administered 2023-09-22 – 2023-09-24 (×3): 6 via TOPICAL

## 2023-09-22 MED ORDER — KETOCONAZOLE 2 % EX SHAM
MEDICATED_SHAMPOO | Freq: Every day | CUTANEOUS | Status: AC
  Filled 2023-09-22: qty 120

## 2023-09-22 MED ORDER — FLUTICASONE PROPIONATE 50 MCG/ACT NA SUSP
1.0000 | Freq: Two times a day (BID) | NASAL | Status: DC
  Administered 2023-09-22 – 2023-09-24 (×5): 1 via NASAL
  Filled 2023-09-22: qty 16

## 2023-09-22 NOTE — Progress Notes (Signed)
[facility] Progress Note   Subjective:    Seen and examined patient at bedside. Noted recent admit for influenza pneumonia. Patient was then re-admitted due to new onset fever and fatigue. Found to have staph epi bacteremia. ID consulted and also at bedside.  Tolerated yesterday's HD with net UF 1L. On ABXs. Next HD 2/12.  Objective Vitals:   09/21/23 1412 09/21/23 1551 09/21/23 1940 09/22/23 0755  BP: 128/66 125/65 (!) 148/61 (!) 151/78  Pulse: 73 75  77  Resp: 14 18 18 20   Temp:   98 F (36.7 C)   TempSrc:   Oral   SpO2: 98% 95% 91% 93%  Weight:       Physical Exam General: Awake, alert, on O2 [place], NAD Heart: S1 and S2; No murmurs, gallops, or rubs Lungs: Diminished bilateral bases, clear in uppers. No wheezing, rales, or rhonchi Abdomen: Soft and non-tender Extremities: No bilateral LE edema Dialysis Access: R AVG (+) B/T   Filed Weights   09/21/23 0600 09/21/23 0950 09/21/23 1355  Weight: 69.9 kg 70.4 kg 69.5 kg    Intake/Output Summary (Last 24 hours) at 09/22/2023 1054 Last data filed at 09/22/2023 0600 Gross per 24 hour  Intake 740 ml  Output 1000 ml  Net -260 ml    Additional Objective Labs: Basic Metabolic Panel: Recent Labs  Lab 09/19/23 0458 09/20/23 0801 09/21/23 0813  NA 136 137 136  K 3.9 4.4 4.6  CL 95* 94* 92*  CO2 28 31 29   GLUCOSE 82 84 84  BUN 17 31* 45*  CREATININE 4.99* 7.38* 8.85*  CALCIUM 7.7* 9.1 9.4  PHOS 4.5  --   --    Liver Function Tests: Recent Labs  Lab 09/15/23 1407 09/18/23 1929 09/19/23 0458  AST 30 23 22   ALT 84* 46* 37  ALKPHOS 96 95 72  BILITOT 0.5 1.0 0.7  PROT 6.0 6.4* 5.4*  ALBUMIN 3.2* 2.4* 2.0*   No results for input(s): "LIPASE", "AMYLASE" in the last 168 hours. CBC: Recent Labs  Lab 09/15/23 1407 09/18/23 1922 09/18/23 1929 09/19/23 0458 09/20/23 0554 09/21/23 0506  WBC 3.4*  --  10.5 8.7 9.2 9.9  NEUTROABS 2.2  --  9.3* 7.4 7.6  --   HGB 11.6*   < > 11.4* 10.2* 11.6* 11.0*   HCT 36.3*   < > 37.5* 33.6* 37.2* 37.0*  MCV 81.8  --  84.5 85.5 83.4 86.9  PLT 97.0*  --  123* 114* 123* 108*   < > = values in this interval not displayed.   Blood Culture    Component Value Date/Time   SDES BLOOD LEFT ARM 09/21/2023 0813   SPECREQUEST  09/21/2023 0813    BOTTLES DRAWN AEROBIC AND ANAEROBIC Blood Culture adequate volume   CULT  09/21/2023 0813    NO GROWTH < 24 HOURS Performed at [facility], [address], [place], [place] [address]    REPTSTATUS PENDING 09/21/2023 [phone]    Cardiac Enzymes: No results for input(s): "CKTOTAL", "CKMB", "CKMBINDEX", "TROPONINI" in the last 168 hours. CBG: Recent Labs  Lab 09/21/23 0753 09/21/23 1416 09/21/23 1556 09/21/23 2121 09/22/23 0801  GLUCAP 73 69* 116* 89 69*   Iron Studies: No results for input(s): "IRON", "TIBC", "TRANSFERRIN", "FERRITIN" in the last 72 hours. Lab Results  Component Value Date   INR 1.2 09/18/2023   INR 1.4 (H) 09/07/2023   INR 1.08 06/28/2014   Studies/Results: No results found.  Medications:   ceFAZolin (ANCEF) IV  Stopped (09/21/23 1653)    aspirin  81 mg Oral Daily   bacitracin   Topical BID   Chlorhexidine Gluconate Cloth  6 each Topical Q0600   cinacalcet  30 mg Oral Q M,W,F-HD   doxercalciferol  8 mcg Intravenous Q M,W,F-HD   fluticasone  1 spray Each Nare BID   guaiFENesin  600 mg Oral BID   heparin injection (subcutaneous)  5,000 Units Subcutaneous Q8H   hydrALAZINE  25 mg Oral BID   insulin aspart  0-6 Units Subcutaneous TID WC   ketoconazole   Topical Daily   levETIRAcetam  500 mg Oral Daily   sevelamer carbonate  1,600 mg Oral TID WC    Dialysis Orders: MWF G-O 4h  B400   71kg   2/2 bath  R AVG  Heparin 4000 - last HD 2/07, post wt 70.8 - step-wise lowering of dry wt 3-4kg over last 3 wks - gets to dry wt - venofer 100 qhd thru 2/19 - hectorol 8 mcg  - sensipar 30 mg three times per week - last Hb 11.1, last tsat 21, last pth 526, phos  7.3  Renal-related home meds: - norvasc 10 - coreg 25 bid - renvela 1600 ac tid - hydralazine 25 bid - losartan 25 bid  Assessment/Plan: Sepsis due to flu A infection -. Noted recent admit for influenza pneumonia. Patient was then re-admitted due to new onset fever and fatigue. Found to have staph epi bacteremia. ID now following. Blood cultures from 2/10 NGTD. ID reccommended TEE. On Cefazolin with HD. ESRD - on HD MWF. Has not missed HD. Next HD 2/12.  HTN - BP's stable, getting home losartan/ norvasc here.  Volume - no vol excess on exam. CXR not suggestive of edema either. Appears he's now under his EDW. Will continue minimal UF with HD tomorrow. Lower EDW at discharge. 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 Hold IV Fe load d/t infection.  Secondary hyperparathyroidism - CCa and phos are in range. Cont IV vdra and sensipar three times per week.   Greg Holmes, NP [facility] 09/22/2023,10:54 AM  LOS: 4 days

## 2023-09-22 NOTE — Progress Notes (Signed)
[facility]    Date of Admission:  09/18/2023   Total days of antibiotics 4   ID: [name] is a 73 y.o. male with  staph epi bacteremia following influenza pneumonia Principal Problem:   Influenza A Active Problems:   Essential hypertension   ESRD on dialysis (HCC)   History of anemia due to chronic kidney disease   Chronic diastolic CHF (congestive heart failure) (HCC)   Acute respiratory failure with hypoxia (HCC)   DM2 (diabetes mellitus, type 2) (HCC)   Severe sepsis (HCC)   Acute metabolic encephalopathy   Hypoglycemia   History of seizures    Subjective: Afebrile, but still feeling poorly, dry sinuses, scalp discomfort but no rash. No pain in avf site  Medications:   aspirin  81 mg Oral Daily   bacitracin   Topical BID   Chlorhexidine Gluconate Cloth  6 each Topical Q0600   cinacalcet  30 mg Oral Q M,W,F-HD   doxercalciferol  8 mcg Intravenous Q M,W,F-HD   fluticasone  1 spray Each Nare BID   guaiFENesin  600 mg Oral BID   heparin injection (subcutaneous)  5,000 Units Subcutaneous Q8H   hydrALAZINE  25 mg Oral BID   insulin aspart  0-6 Units Subcutaneous TID WC   ketoconazole   Topical Daily   levETIRAcetam  500 mg Oral Daily   sevelamer carbonate  1,600 mg Oral TID WC    Objective: Vital signs in last 24 hours: Temp:  [98 F (36.7 C)-99.5 F (37.5 C)] 99.5 F (37.5 C) (02/11 1557) Pulse Rate:  [77-78] 78 (02/11 1557) Resp:  [15-20] 20 (02/11 1557) BP: (142-156)/(61-81) 156/81 (02/11 1557) SpO2:  [91 %-93 %] 92 % (02/11 1557)  Physical Exam  Constitutional: He is oriented to person, place, and time. He appears well-developed and well-nourished. No distress.  HENT:  Mouth/Throat: Oropharynx is clear and moist. No oropharyngeal exudate.  Cardiovascular: Normal rate, regular rhythm and normal heart sounds. Exam reveals no gallop and no friction rub.  No murmur heard.  Pulmonary/Chest: Effort normal and breath sounds  normal. No respiratory distress. He has no wheezes.  Abdominal: Soft. Bowel sounds are normal. He exhibits no distension. There is no tenderness.  Lymphadenopathy:  He has no cervical adenopathy.  Neurological: He is alert and oriented to person, place, and time.  Skin: Skin is warm and dry. No rash noted. No erythema.  Psychiatric: He has a normal mood and affect. His behavior is normal.    Lab Results Recent Labs    09/20/23 0554 09/20/23 0801 09/21/23 0506 09/21/23 0813  WBC 9.2  --  9.9  --   HGB 11.6*  --  11.0*  --   HCT 37.2*  --  37.0*  --   NA  --  137  --  136  K  --  4.4  --  4.6  CL  --  94*  --  92*  CO2  --  31  --  29  BUN  --  31*  --  45*  CREATININE  --  7.38*  --  8.85*   Liver Panel No results for input(s): "PROT", "ALBUMIN", "AST", "ALT", "ALKPHOS", "BILITOT", "BILIDIR", "IBILI" in the last 72 hours. Sedimentation Rate No results for input(s): "ESRSEDRATE" in the last 72 hours. C-Reactive Protein No results for input(s): "CRP" in the last 72 hours.  Microbiology: 2/10 blood cx NGTD at 24hr 2/7 blood cx 3/5 staph epi Studies/Results: No results  found.   Assessment/Plan: Staph epi bacteremia = continue on cefazolin renally dosed, will continue to follow blood cx to see if clearing bacteremia; recommend to get TEE to rule out endocarditis  Influenza pneumonia= has bene greater than 7 days since diagnosis. No longer needs droplet isolation. Continue with supportive care. Will d/c azithromycin  Dry sinuses = will recommend saline nasal spray   ESRD on HD = continue on m-w-f schedule. Anemia of chronic disease= hgb stable.  [facility] Pager: [phone]  09/22/2023, 4:06 PM

## 2023-09-22 NOTE — H&P (View-Only) (Signed)
PROGRESS NOTE    [name]  ZOX:[id number] DOB: 08-07-1951 DOA: 09/18/2023 PCP: Etta Grandchild, MD    Chief Complaint  Patient presents with   Tremors    Brief Narrative:   [name] is a 73 y.o. male with medical history significant for end-stage renal disease on hemodialysis on Monday, Wednesday, Friday schedule, type 2 diabetes mellitus, essential hypertension, seizures, chronic diastolic heart failure, who is admitted to [facility] on 09/18/2023 with influenza A infection after presenting from home to [facility] ED complaining of fever.     Patient on hemodialysis on Monday, Wednesday, Friday schedule, and was able to complete today's hemodialysis session. However, following hemodialysis, his subjective fever, chills intensified, resulting in his presentation to [facility] emergency department for further evaluation management thereof.  Influenza A PCR was confirmed to be positive. COVID, influenza B, and RSV PCR were all negative.   Assessment & Plan:   Principal Problem:   Influenza A Active Problems:   Essential hypertension   ESRD on dialysis (HCC)   History of anemia due to chronic kidney disease   Chronic diastolic CHF (congestive heart failure) (HCC)   Acute respiratory failure with hypoxia (HCC)   DM2 (diabetes mellitus, type 2) (HCC)   Severe sepsis (HCC)   Acute metabolic encephalopathy   Hypoglycemia   History of seizures  Sepsis, present on admission due to influenza A infection  Acute respiratory failure with hypoxia from influenza A infection.  3/4 Staph epidermidis bacteremia bacteremia -Fever of 100.8, tachypnea 22/min , influenza A infection. Normal wbc count,  -COVID-19/RSV PCR are negative.  -Sepsis from influenza A infection. He is outside the window for tamiflu.  -CXR shows Cardiomegaly and low lung volumes with mild mid left lung and bibasilar atelectasis and/or infiltrate. -3/4 blood culture growing Staph epidermidis, management  per ID, continue with IV cefazolin, discussed with cardiology to arrange for TEE  -Follow repeat blood cultures 2/10   Acute metabolic encephalopathy Due to above CT head Atrophy with chronic microvascular ischemic changes.  Mentation continues to improve   Hypoglycemia in the setting of type 2 DM:  -Continue to monitor CBG closely   Thrombocytopenia:  Improving.    ESRD on HD Nephrology will be consulted.    Hypertension:  Well controlled.  Appears to be on the lower abdomen, will continue with hydralazine and discontinue lisinopril for now   Seizures:  Continue with keppra.    Chronic diastolic-systolic CHF -Repeat echo with EF 40 to 45% -Volume management with hemodialysis No signs of decompensation.  Fluid management with HD.    Anemia of ESRD Hemoglobin appears adequate.  Iron and Procrit per renal when appropriate   Deconditioning -patient appears to be extremely weak, will consult PT/OT  Patient complains of hair follicle pain, nothing significant on physical exam, but will start on 0 shampoo x 2 days, as well as started on nasal bacitracin and Flonase given nasal congestion.   DVT prophylaxis: Heparin Code Status: Full code Family Communication: Wife at bedside Disposition:   Status is: Inpatient    Consultants:  Renal ID  Subjective:  Patient reports significant weakness, he does report constipation as well, enema did not help yesterday.  Objective: Vitals:   09/21/23 1412 09/21/23 1551 09/21/23 1940 09/22/23 0755  BP: 128/66 125/65 (!) 148/61 (!) 151/78  Pulse: 73 75  77  Resp: 14 18 18 20   Temp:   98 F (36.7 C)   TempSrc:   Oral   SpO2:  98% 95% 91% 93%  Weight:        Intake/Output Summary (Last 24 hours) at 09/22/2023 1356 Last data filed at 09/22/2023 0600 Gross per 24 hour  Intake 740 ml  Output --  Net 740 ml   Filed Weights   09/21/23 0600 09/21/23 0950 09/21/23 1355  Weight: 69.9 kg 70.4 kg 69.5 kg     Examination:   Awake Alert, Oriented X 3, extremely frail, deconditioned Symmetrical Chest wall movement, Good air movement bilaterally, CTAB RRR,No Gallops,Rubs or new Murmurs, No Parasternal Heave +ve B.Sounds, Abd Soft, No tenderness, No rebound - guarding or rigidity. Significant muscle wasting     Data Reviewed: I have personally reviewed following labs and imaging studies  CBC: Recent Labs  Lab 09/15/23 1407 09/18/23 1922 09/18/23 1929 09/19/23 0458 09/20/23 0554 09/21/23 0506  WBC 3.4*  --  10.5 8.7 9.2 9.9  NEUTROABS 2.2  --  9.3* 7.4 7.6  --   HGB 11.6* 12.9* 11.4* 10.2* 11.6* 11.0*  HCT 36.3* 38.0* 37.5* 33.6* 37.2* 37.0*  MCV 81.8  --  84.5 85.5 83.4 86.9  PLT 97.0*  --  123* 114* 123* 108*    Basic Metabolic Panel: Recent Labs  Lab 09/15/23 1407 09/18/23 1922 09/18/23 1929 09/19/23 0458 09/20/23 0801 09/21/23 0813  NA 135 135 137 136 137 136  K 4.0 3.6 3.9 3.9 4.4 4.6  CL 91*  --  93* 95* 94* 92*  CO2 32  --  30 28 31 29   GLUCOSE 77  --  92 82 84 84  BUN 42*  --  14 17 31* 45*  CREATININE 7.87*  --  4.60* 4.99* 7.38* 8.85*  CALCIUM 8.2*  8.2*  --  8.4* 7.7* 9.1 9.4  MG 2.2  --  2.0 2.0  --  2.3  PHOS  --   --   --  4.5  --   --     GFR: Estimated Creatinine Clearance: 7.6 mL/min (A) (by C-G formula based on SCr of 8.85 mg/dL (H)).  Liver Function Tests: Recent Labs  Lab 09/15/23 1407 09/18/23 1929 09/19/23 0458  AST 30 23 22   ALT 84* 46* 37  ALKPHOS 96 95 72  BILITOT 0.5 1.0 0.7  PROT 6.0 6.4* 5.4*  ALBUMIN 3.2* 2.4* 2.0*    CBG: Recent Labs  Lab 09/21/23 1416 09/21/23 1556 09/21/23 2121 09/22/23 0801 09/22/23 1226  GLUCAP 69* 116* 89 69* 83     Recent Results (from the past 240 hours)  Resp panel by RT-PCR (RSV, Flu A&B, Covid) Peripheral     Status: Abnormal   Collection Time: 09/18/23  6:57 PM   Specimen: Peripheral; Nasal Swab  Result Value Ref Range Status   SARS Coronavirus 2 by RT PCR NEGATIVE NEGATIVE  Final   Influenza A by PCR POSITIVE (A) NEGATIVE Final   Influenza B by PCR NEGATIVE NEGATIVE Final    Comment: (NOTE) The Xpert Xpress SARS-CoV-2/FLU/RSV plus assay is intended as an aid in the diagnosis of influenza from Nasopharyngeal swab specimens and should not be used as a sole basis for treatment. Nasal washings and aspirates are unacceptable for Xpert Xpress SARS-CoV-2/FLU/RSV testing.  Fact Sheet for Patients: BloggerCourse.com  Fact Sheet for Healthcare Providers: SeriousBroker.it  This test is not yet approved or cleared by the [place] FDA and has been authorized for detection and/or diagnosis of SARS-CoV-2 by FDA under an Emergency Use Authorization (EUA). This EUA will remain in effect (meaning this test can be  used) for the duration of the COVID-19 declaration under Section 564(b)(1) of the Act, 21 U.S.C. section 360bbb-3(b)(1), unless the authorization is terminated or revoked.     Resp Syncytial Virus by PCR NEGATIVE NEGATIVE Final    Comment: (NOTE) Fact Sheet for Patients: BloggerCourse.com  Fact Sheet for Healthcare Providers: SeriousBroker.it  This test is not yet approved or cleared by the [place] FDA and has been authorized for detection and/or diagnosis of SARS-CoV-2 by FDA under an Emergency Use Authorization (EUA). This EUA will remain in effect (meaning this test can be used) for the duration of the COVID-19 declaration under Section 564(b)(1) of the Act, 21 U.S.C. section 360bbb-3(b)(1), unless the authorization is terminated or revoked.  Performed at [facility], [address], [place], [place] [address]   Blood Culture (routine x 2)     Status: Abnormal (Preliminary result)   Collection Time: 09/18/23  7:00 PM   Specimen: BLOOD LEFT ARM  Result Value Ref Range Status   Specimen Description BLOOD LEFT ARM  Final   Special  Requests   Final    BOTTLES DRAWN AEROBIC AND ANAEROBIC Blood Culture results may not be optimal due to an inadequate volume of blood received in culture bottles   Culture  Setup Time   Final    GRAM POSITIVE COCCI IN BOTH AEROBIC AND ANAEROBIC BOTTLES CRITICAL RESULT CALLED TO, READ BACK BY AND VERIFIED WITH: PHARMD [id number] AT 1622 CAREN A., ADC    Culture (A)  Final    STAPHYLOCOCCUS EPIDERMIDIS SUSCEPTIBILITIES TO FOLLOW Performed at [facility], [address] [place], [place] [address]    Report Status PENDING  Incomplete  Blood Culture ID Panel (Reflexed)     Status: Abnormal   Collection Time: 09/18/23  7:00 PM  Result Value Ref Range Status   Enterococcus faecalis NOT DETECTED NOT DETECTED Final   Enterococcus Faecium NOT DETECTED NOT DETECTED Final   Listeria monocytogenes NOT DETECTED NOT DETECTED Final   Staphylococcus species DETECTED (A) NOT DETECTED Final    Comment: CRITICAL RESULT CALLED TO, READ BACK BY AND VERIFIED WITH: PHARMD [id number] AT 1622 CAREN A., ADC    Staphylococcus aureus (BCID) NOT DETECTED NOT DETECTED Final   Staphylococcus epidermidis DETECTED (A) NOT DETECTED Final    Comment: CRITICAL RESULT CALLED TO, READ BACK BY AND VERIFIED WITH: PHARMD [id number] AT 1622 CAREN A., ADC    Staphylococcus lugdunensis NOT DETECTED NOT DETECTED Final   Streptococcus species NOT DETECTED NOT DETECTED Final   Streptococcus agalactiae NOT DETECTED NOT DETECTED Final   Streptococcus pneumoniae NOT DETECTED NOT DETECTED Final   Streptococcus pyogenes NOT DETECTED NOT DETECTED Final   A.calcoaceticus-baumannii NOT DETECTED NOT DETECTED Final   Bacteroides fragilis NOT DETECTED NOT DETECTED Final   Enterobacterales NOT DETECTED NOT DETECTED Final   Enterobacter cloacae complex NOT DETECTED NOT DETECTED Final   Escherichia coli NOT DETECTED NOT DETECTED Final   Klebsiella aerogenes NOT DETECTED NOT DETECTED Final   Klebsiella oxytoca NOT DETECTED NOT DETECTED Final    Klebsiella pneumoniae NOT DETECTED NOT DETECTED Final   Proteus species NOT DETECTED NOT DETECTED Final   Salmonella species NOT DETECTED NOT DETECTED Final   Serratia marcescens NOT DETECTED NOT DETECTED Final   Haemophilus influenzae NOT DETECTED NOT DETECTED Final   Neisseria meningitidis NOT DETECTED NOT DETECTED Final   Pseudomonas aeruginosa NOT DETECTED NOT DETECTED Final   Stenotrophomonas maltophilia NOT DETECTED NOT DETECTED Final   Candida albicans NOT DETECTED NOT  DETECTED Final   Candida auris NOT DETECTED NOT DETECTED Final   Candida glabrata NOT DETECTED NOT DETECTED Final   Candida krusei NOT DETECTED NOT DETECTED Final   Candida parapsilosis NOT DETECTED NOT DETECTED Final   Candida tropicalis NOT DETECTED NOT DETECTED Final   Cryptococcus neoformans/gattii NOT DETECTED NOT DETECTED Final   Methicillin resistance mecA/C NOT DETECTED NOT DETECTED Final    Comment: Performed at [facility], [address], [place], [place] [address]  Blood Culture (routine x 2)     Status: Abnormal   Collection Time: 09/18/23  7:29 PM   Specimen: BLOOD LEFT HAND  Result Value Ref Range Status   Specimen Description BLOOD LEFT HAND  Final   Special Requests   Final    BOTTLES DRAWN AEROBIC ONLY Blood Culture results may not be optimal due to an inadequate volume of blood received in culture bottles   Culture  Setup Time   Final    GRAM POSITIVE COCCI AEROBIC BOTTLE ONLY CRITICAL VALUE NOTED.  VALUE IS CONSISTENT WITH PREVIOUSLY REPORTED AND CALLED VALUE. Performed at [facility], [address], [place], [place] [address]    Culture STAPHYLOCOCCUS EPIDERMIDIS (A)  Final   Report Status 09/22/2023 FINAL  Final   Organism ID, Bacteria STAPHYLOCOCCUS EPIDERMIDIS  Final      Susceptibility   Staphylococcus epidermidis - MIC*    CIPROFLOXACIN <=0.5 SENSITIVE Sensitive     ERYTHROMYCIN <=0.25 SENSITIVE Sensitive     GENTAMICIN <=0.5 SENSITIVE Sensitive     OXACILLIN  <=0.25 SENSITIVE Sensitive     TETRACYCLINE 2 SENSITIVE Sensitive     VANCOMYCIN 2 SENSITIVE Sensitive     TRIMETH/SULFA 20 SENSITIVE Sensitive     CLINDAMYCIN <=0.25 SENSITIVE Sensitive     RIFAMPIN <=0.5 SENSITIVE Sensitive     Inducible Clindamycin NEGATIVE Sensitive     * STAPHYLOCOCCUS EPIDERMIDIS  Culture, blood (single) w Reflex to ID Panel     Status: None (Preliminary result)   Collection Time: 09/21/23  8:13 AM   Specimen: BLOOD LEFT ARM  Result Value Ref Range Status   Specimen Description BLOOD LEFT ARM  Final   Special Requests   Final    BOTTLES DRAWN AEROBIC AND ANAEROBIC Blood Culture adequate volume   Culture   Final    NO GROWTH < 24 HOURS Performed at [facility], [address] [place], [place] [address]    Report Status PENDING  Incomplete         Radiology Studies: No results found.       Scheduled Meds:  aspirin  81 mg Oral Daily   bacitracin   Topical BID   Chlorhexidine Gluconate Cloth  6 each Topical Q0600   cinacalcet  30 mg Oral Q M,W,F-HD   doxercalciferol  8 mcg Intravenous Q M,W,F-HD   fluticasone  1 spray Each Nare BID   guaiFENesin  600 mg Oral BID   heparin injection (subcutaneous)  5,000 Units Subcutaneous Q8H   hydrALAZINE  25 mg Oral BID   insulin aspart  0-6 Units Subcutaneous TID WC   ketoconazole   Topical Daily   levETIRAcetam  500 mg Oral Daily   sevelamer carbonate  1,600 mg Oral TID WC   Continuous Infusions:   ceFAZolin (ANCEF) IV Stopped (09/21/23 1653)     LOS: 4 days       Huey Bienenstock, MD [facility]   To contact the attending provider between 7A-7P or the covering  provider during after hours 7P-7A, please log into the web site [email or web address] and access using universal [place] password for that web site. If you do not have the password, please call the hospital operator.  09/22/2023, 1:56 PM

## 2023-09-22 NOTE — Evaluation (Signed)
Physical Therapy Evaluation Patient Details Name: [name] MRN: [id number] DOB: 1951-02-27 Today's Date: 09/22/2023  History of Present Illness  [name] is a 73 yo male presenting to [facility] with reports of fever with chills. Pt was admitted to [facility] on 09/18/2023 with Influenza A infection. PMHx includes ESRD on HD, HTN, PAD, HLD. Recent admission due to 11 days of missed HD.  Clinical Impression  Pt presents with admitting diagnosis above. Pt today was able to ambulate short distance in hallway with RW CGA. Pt noted with very flat affect and also to be somewhat self limiting. PTA pt wife reports that pt was independent not using RW that was provided from previous admission a few weeks ago. Pt and family educated that he will likely benefit from RW use initially going forward until strength is built back up. Recommend HHPT upon DC. PT will continue to follow.         If plan is discharge home, recommend the following: A little help with walking and/or transfers;Assistance with cooking/housework;Assist for transportation;Help with stairs or ramp for entrance   Can travel by private vehicle        Equipment Recommendations None recommended by PT (DME delivered from previous admission 2 weeks ago.)  Recommendations for Other Services       Functional Status Assessment Patient has had a recent decline in their functional status and demonstrates the ability to make significant improvements in function in a reasonable and predictable amount of time.     Precautions / Restrictions Precautions Precautions: Fall;Other (comment) Recall of Precautions/Restrictions: Intact Precaution/Restrictions Comments: Droplet Restrictions Weight Bearing Restrictions Per Provider Order: No      Mobility  Bed Mobility Overal bed mobility: Needs Assistance Bed Mobility: Supine to Sit, Sit to Supine     Supine to sit: Supervision Sit to supine: Min assist   General bed mobility comments:  VC to initiate task. Increased time to initiate. pt requested assistance with BLE management on return to bed.    Transfers Overall transfer level: Needs assistance Equipment used: Rolling walker (2 wheels) Transfers: Sit to/from Stand Sit to Stand: Contact guard assist, Min assist           General transfer comment: Pt required verbal/tactile cues for B hand placement to push up from surface when performing sit to stand transfers. Pt required verbal cues for sequencing and safety using RW to complete sit to stand Pt requires increased time to initiate tasks. Pt with LOB on initial sit to stand requiring Min A to correct.    Ambulation/Gait Ambulation/Gait assistance: Contact guard assist Gait Distance (Feet): 50 Feet Assistive device: Rolling walker (2 wheels) Gait Pattern/deviations: Decreased stride length, Step-through pattern Gait velocity: decreased     General Gait Details: Very slow gait pattern. Requires constant cues to stay within RW.  Stairs            Wheelchair Mobility     Tilt Bed    Modified Rankin (Stroke Patients Only)       Balance Overall balance assessment: Needs assistance Sitting-balance support: No upper extremity supported, Feet supported Sitting balance-Leahy Scale: Good     Standing balance support: During functional activity, Bilateral upper extremity supported Standing balance-Leahy Scale: Fair Standing balance comment: Reliant on RW                             Pertinent Vitals/Pain Pain Assessment Pain Assessment: No/denies pain  Home Living Family/patient expects to be discharged to:: Private residence Living Arrangements: Spouse/significant other Available Help at Discharge: Family;Available 24 hours/day Type of Home: House Home Access: Stairs to enter Entrance Stairs-Rails: None Entrance Stairs-Number of Steps: 3-4 Alternate Level Stairs-Number of Steps: flight Home Layout: Two level;Able to live on  main level with bedroom/bathroom Home Equipment: Rolling Walker (2 wheels);Shower seat;BSC/3in1      Prior Function Prior Level of Function : Independent/Modified Independent;Driving             Mobility Comments: Pt able to complete functional mobility independently without use of DME but was provided with RW from last hospital visit ADLs Comments: Pt able to complete ADLs independently     Extremity/Trunk Assessment   Upper Extremity Assessment Upper Extremity Assessment: Overall WFL for tasks assessed    Lower Extremity Assessment Lower Extremity Assessment: Overall WFL for tasks assessed    Cervical / Trunk Assessment Cervical / Trunk Assessment: Normal  Communication   Communication Communication: No apparent difficulties    Cognition Arousal: Lethargic Behavior During Therapy: Flat affect   PT - Cognitive impairments: No apparent impairments                         Following commands: Intact       Cueing Cueing Techniques: Verbal cues, Tactile cues     General Comments General comments (skin integrity, edema, etc.): VSS on RA    Exercises     Assessment/Plan    PT Assessment Patient needs continued PT services  PT Problem List Decreased strength;Decreased balance;Decreased mobility;Decreased activity tolerance;Decreased cognition;Decreased safety awareness       PT Treatment Interventions DME instruction;Therapeutic activities;Gait training;Therapeutic exercise;Patient/family education;Stair training;Balance training;Functional mobility training    PT Goals (Current goals can be found in the Care Plan section)  Acute Rehab PT Goals Patient Stated Goal: to go home PT Goal Formulation: With patient Time For Goal Achievement: 10/06/23 Potential to Achieve Goals: Fair    Frequency Min 1X/week     Co-evaluation               AM-PAC PT "6 Clicks" Mobility  Outcome Measure Help needed turning from your back to your side while in  a flat bed without using bedrails?: A Little Help needed moving from lying on your back to sitting on the side of a flat bed without using bedrails?: A Little Help needed moving to and from a bed to a chair (including a wheelchair)?: A Little Help needed standing up from a chair using your arms (e.g., wheelchair or bedside chair)?: A Little Help needed to walk in hospital room?: A Little Help needed climbing 3-5 steps with a railing? : A Lot 6 Click Score: 17    End of Session Equipment Utilized During Treatment: Gait belt Activity Tolerance: Patient tolerated treatment well;Patient limited by fatigue Patient left: in bed;with call bell/phone within reach;with bed alarm set Nurse Communication: Mobility status PT Visit Diagnosis: Unsteadiness on feet (R26.81);Other abnormalities of gait and mobility (R26.89)    Time: 1140-1159 PT Time Calculation (min) (ACUTE ONLY): 19 min   Charges:   PT Evaluation $PT Eval Moderate Complexity: 1 Mod   PT General Charges $$ ACUTE PT VISIT: 1 Visit         Shela Nevin, PT, DPT Acute Rehab Services [phone]   Gladys Damme 09/22/2023, 12:32 PM

## 2023-09-22 NOTE — Progress Notes (Signed)
   [facility] has been requested to perform a transesophageal echocardiogram on [name] for bacteremia.     The patient does NOT have any absolute or relative contraindications to a Transesophageal Echocardiogram (TEE).  The patient has: History of Reduced Ejection Fraction- EF 45-50% this admission     After careful review of history and examination, the risks and benefits of transesophageal echocardiogram have been explained including risks of esophageal damage, perforation (1:10,000 risk), bleeding, pharyngeal hematoma as well as other potential complications associated with conscious sedation including aspiration, arrhythmia, respiratory failure and death. Alternatives to treatment were discussed, questions were answered. Patient is willing to proceed.   Signed, Jonita Albee, PA-C  09/22/2023 3:26 PM

## 2023-09-22 NOTE — Plan of Care (Addendum)
Pt has been  up most of night with no distress noted. Alert and oriented to person and place. On room air. SR on the monitor. Up to [facility]. Had large brown BM. Medicated for HA with relief noted. Pt up and down all night. Very rude with staff and his wife and argumentative. Up in chair for awhile.     Problem: Clinical Measurements: Goal: Respiratory complications will improve Outcome: Progressing   Problem: Activity: Goal: Risk for activity intolerance will decrease Outcome: Progressing   Problem: Nutrition: Goal: Adequate nutrition will be maintained Outcome: Progressing   Problem: Elimination: Goal: Will not experience complications related to bowel motility Outcome: Progressing   Problem: Pain Managment: Goal: General experience of comfort will improve and/or be controlled Outcome: Progressing

## 2023-09-22 NOTE — Progress Notes (Signed)
PROGRESS NOTE    [name]  ZOX:[id number] DOB: 08-07-1951 DOA: 09/18/2023 PCP: Etta Grandchild, MD    Chief Complaint  Patient presents with   Tremors    Brief Narrative:   [name] is a 73 y.o. male with medical history significant for end-stage renal disease on hemodialysis on Monday, Wednesday, Friday schedule, type 2 diabetes mellitus, essential hypertension, seizures, chronic diastolic heart failure, who is admitted to [facility] on 09/18/2023 with influenza A infection after presenting from home to [facility] ED complaining of fever.     Patient on hemodialysis on Monday, Wednesday, Friday schedule, and was able to complete today's hemodialysis session. However, following hemodialysis, his subjective fever, chills intensified, resulting in his presentation to [facility] emergency department for further evaluation management thereof.  Influenza A PCR was confirmed to be positive. COVID, influenza B, and RSV PCR were all negative.   Assessment & Plan:   Principal Problem:   Influenza A Active Problems:   Essential hypertension   ESRD on dialysis (HCC)   History of anemia due to chronic kidney disease   Chronic diastolic CHF (congestive heart failure) (HCC)   Acute respiratory failure with hypoxia (HCC)   DM2 (diabetes mellitus, type 2) (HCC)   Severe sepsis (HCC)   Acute metabolic encephalopathy   Hypoglycemia   History of seizures  Sepsis, present on admission due to influenza A infection  Acute respiratory failure with hypoxia from influenza A infection.  3/4 Staph epidermidis bacteremia bacteremia -Fever of 100.8, tachypnea 22/min , influenza A infection. Normal wbc count,  -COVID-19/RSV PCR are negative.  -Sepsis from influenza A infection. He is outside the window for tamiflu.  -CXR shows Cardiomegaly and low lung volumes with mild mid left lung and bibasilar atelectasis and/or infiltrate. -3/4 blood culture growing Staph epidermidis, management  per ID, continue with IV cefazolin, discussed with cardiology to arrange for TEE  -Follow repeat blood cultures 2/10   Acute metabolic encephalopathy Due to above CT head Atrophy with chronic microvascular ischemic changes.  Mentation continues to improve   Hypoglycemia in the setting of type 2 DM:  -Continue to monitor CBG closely   Thrombocytopenia:  Improving.    ESRD on HD Nephrology will be consulted.    Hypertension:  Well controlled.  Appears to be on the lower abdomen, will continue with hydralazine and discontinue lisinopril for now   Seizures:  Continue with keppra.    Chronic diastolic-systolic CHF -Repeat echo with EF 40 to 45% -Volume management with hemodialysis No signs of decompensation.  Fluid management with HD.    Anemia of ESRD Hemoglobin appears adequate.  Iron and Procrit per renal when appropriate   Deconditioning -patient appears to be extremely weak, will consult PT/OT  Patient complains of hair follicle pain, nothing significant on physical exam, but will start on 0 shampoo x 2 days, as well as started on nasal bacitracin and Flonase given nasal congestion.   DVT prophylaxis: Heparin Code Status: Full code Family Communication: Wife at bedside Disposition:   Status is: Inpatient    Consultants:  Renal ID  Subjective:  Patient reports significant weakness, he does report constipation as well, enema did not help yesterday.  Objective: Vitals:   09/21/23 1412 09/21/23 1551 09/21/23 1940 09/22/23 0755  BP: 128/66 125/65 (!) 148/61 (!) 151/78  Pulse: 73 75  77  Resp: 14 18 18 20   Temp:   98 F (36.7 C)   TempSrc:   Oral   SpO2:  98% 95% 91% 93%  Weight:        Intake/Output Summary (Last 24 hours) at 09/22/2023 1356 Last data filed at 09/22/2023 0600 Gross per 24 hour  Intake 740 ml  Output --  Net 740 ml   Filed Weights   09/21/23 0600 09/21/23 0950 09/21/23 1355  Weight: 69.9 kg 70.4 kg 69.5 kg     Examination:   Awake Alert, Oriented X 3, extremely frail, deconditioned Symmetrical Chest wall movement, Good air movement bilaterally, CTAB RRR,No Gallops,Rubs or new Murmurs, No Parasternal Heave +ve B.Sounds, Abd Soft, No tenderness, No rebound - guarding or rigidity. Significant muscle wasting     Data Reviewed: I have personally reviewed following labs and imaging studies  CBC: Recent Labs  Lab 09/15/23 1407 09/18/23 1922 09/18/23 1929 09/19/23 0458 09/20/23 0554 09/21/23 0506  WBC 3.4*  --  10.5 8.7 9.2 9.9  NEUTROABS 2.2  --  9.3* 7.4 7.6  --   HGB 11.6* 12.9* 11.4* 10.2* 11.6* 11.0*  HCT 36.3* 38.0* 37.5* 33.6* 37.2* 37.0*  MCV 81.8  --  84.5 85.5 83.4 86.9  PLT 97.0*  --  123* 114* 123* 108*    Basic Metabolic Panel: Recent Labs  Lab 09/15/23 1407 09/18/23 1922 09/18/23 1929 09/19/23 0458 09/20/23 0801 09/21/23 0813  NA 135 135 137 136 137 136  K 4.0 3.6 3.9 3.9 4.4 4.6  CL 91*  --  93* 95* 94* 92*  CO2 32  --  30 28 31 29   GLUCOSE 77  --  92 82 84 84  BUN 42*  --  14 17 31* 45*  CREATININE 7.87*  --  4.60* 4.99* 7.38* 8.85*  CALCIUM 8.2*  8.2*  --  8.4* 7.7* 9.1 9.4  MG 2.2  --  2.0 2.0  --  2.3  PHOS  --   --   --  4.5  --   --     GFR: Estimated Creatinine Clearance: 7.6 mL/min (A) (by C-G formula based on SCr of 8.85 mg/dL (H)).  Liver Function Tests: Recent Labs  Lab 09/15/23 1407 09/18/23 1929 09/19/23 0458  AST 30 23 22   ALT 84* 46* 37  ALKPHOS 96 95 72  BILITOT 0.5 1.0 0.7  PROT 6.0 6.4* 5.4*  ALBUMIN 3.2* 2.4* 2.0*    CBG: Recent Labs  Lab 09/21/23 1416 09/21/23 1556 09/21/23 2121 09/22/23 0801 09/22/23 1226  GLUCAP 69* 116* 89 69* 83     Recent Results (from the past 240 hours)  Resp panel by RT-PCR (RSV, Flu A&B, Covid) Peripheral     Status: Abnormal   Collection Time: 09/18/23  6:57 PM   Specimen: Peripheral; Nasal Swab  Result Value Ref Range Status   SARS Coronavirus 2 by RT PCR NEGATIVE NEGATIVE  Final   Influenza A by PCR POSITIVE (A) NEGATIVE Final   Influenza B by PCR NEGATIVE NEGATIVE Final    Comment: (NOTE) The Xpert Xpress SARS-CoV-2/FLU/RSV plus assay is intended as an aid in the diagnosis of influenza from Nasopharyngeal swab specimens and should not be used as a sole basis for treatment. Nasal washings and aspirates are unacceptable for Xpert Xpress SARS-CoV-2/FLU/RSV testing.  Fact Sheet for Patients: BloggerCourse.com  Fact Sheet for Healthcare Providers: SeriousBroker.it  This test is not yet approved or cleared by the [place] FDA and has been authorized for detection and/or diagnosis of SARS-CoV-2 by FDA under an Emergency Use Authorization (EUA). This EUA will remain in effect (meaning this test can be  used) for the duration of the COVID-19 declaration under Section 564(b)(1) of the Act, 21 U.S.C. section 360bbb-3(b)(1), unless the authorization is terminated or revoked.     Resp Syncytial Virus by PCR NEGATIVE NEGATIVE Final    Comment: (NOTE) Fact Sheet for Patients: BloggerCourse.com  Fact Sheet for Healthcare Providers: SeriousBroker.it  This test is not yet approved or cleared by the [place] FDA and has been authorized for detection and/or diagnosis of SARS-CoV-2 by FDA under an Emergency Use Authorization (EUA). This EUA will remain in effect (meaning this test can be used) for the duration of the COVID-19 declaration under Section 564(b)(1) of the Act, 21 U.S.C. section 360bbb-3(b)(1), unless the authorization is terminated or revoked.  Performed at [facility], [address], [place], [place] [address]   Blood Culture (routine x 2)     Status: Abnormal (Preliminary result)   Collection Time: 09/18/23  7:00 PM   Specimen: BLOOD LEFT ARM  Result Value Ref Range Status   Specimen Description BLOOD LEFT ARM  Final   Special  Requests   Final    BOTTLES DRAWN AEROBIC AND ANAEROBIC Blood Culture results may not be optimal due to an inadequate volume of blood received in culture bottles   Culture  Setup Time   Final    GRAM POSITIVE COCCI IN BOTH AEROBIC AND ANAEROBIC BOTTLES CRITICAL RESULT CALLED TO, READ BACK BY AND VERIFIED WITH: PHARMD [id number] AT 1622 CAREN A., ADC    Culture (A)  Final    STAPHYLOCOCCUS EPIDERMIDIS SUSCEPTIBILITIES TO FOLLOW Performed at [facility], [address] [place], [place] [address]    Report Status PENDING  Incomplete  Blood Culture ID Panel (Reflexed)     Status: Abnormal   Collection Time: 09/18/23  7:00 PM  Result Value Ref Range Status   Enterococcus faecalis NOT DETECTED NOT DETECTED Final   Enterococcus Faecium NOT DETECTED NOT DETECTED Final   Listeria monocytogenes NOT DETECTED NOT DETECTED Final   Staphylococcus species DETECTED (A) NOT DETECTED Final    Comment: CRITICAL RESULT CALLED TO, READ BACK BY AND VERIFIED WITH: PHARMD [id number] AT 1622 CAREN A., ADC    Staphylococcus aureus (BCID) NOT DETECTED NOT DETECTED Final   Staphylococcus epidermidis DETECTED (A) NOT DETECTED Final    Comment: CRITICAL RESULT CALLED TO, READ BACK BY AND VERIFIED WITH: PHARMD [id number] AT 1622 CAREN A., ADC    Staphylococcus lugdunensis NOT DETECTED NOT DETECTED Final   Streptococcus species NOT DETECTED NOT DETECTED Final   Streptococcus agalactiae NOT DETECTED NOT DETECTED Final   Streptococcus pneumoniae NOT DETECTED NOT DETECTED Final   Streptococcus pyogenes NOT DETECTED NOT DETECTED Final   A.calcoaceticus-baumannii NOT DETECTED NOT DETECTED Final   Bacteroides fragilis NOT DETECTED NOT DETECTED Final   Enterobacterales NOT DETECTED NOT DETECTED Final   Enterobacter cloacae complex NOT DETECTED NOT DETECTED Final   Escherichia coli NOT DETECTED NOT DETECTED Final   Klebsiella aerogenes NOT DETECTED NOT DETECTED Final   Klebsiella oxytoca NOT DETECTED NOT DETECTED Final    Klebsiella pneumoniae NOT DETECTED NOT DETECTED Final   Proteus species NOT DETECTED NOT DETECTED Final   Salmonella species NOT DETECTED NOT DETECTED Final   Serratia marcescens NOT DETECTED NOT DETECTED Final   Haemophilus influenzae NOT DETECTED NOT DETECTED Final   Neisseria meningitidis NOT DETECTED NOT DETECTED Final   Pseudomonas aeruginosa NOT DETECTED NOT DETECTED Final   Stenotrophomonas maltophilia NOT DETECTED NOT DETECTED Final   Candida albicans NOT DETECTED NOT  DETECTED Final   Candida auris NOT DETECTED NOT DETECTED Final   Candida glabrata NOT DETECTED NOT DETECTED Final   Candida krusei NOT DETECTED NOT DETECTED Final   Candida parapsilosis NOT DETECTED NOT DETECTED Final   Candida tropicalis NOT DETECTED NOT DETECTED Final   Cryptococcus neoformans/gattii NOT DETECTED NOT DETECTED Final   Methicillin resistance mecA/C NOT DETECTED NOT DETECTED Final    Comment: Performed at [facility], [address], [place], [place] [address]  Blood Culture (routine x 2)     Status: Abnormal   Collection Time: 09/18/23  7:29 PM   Specimen: BLOOD LEFT HAND  Result Value Ref Range Status   Specimen Description BLOOD LEFT HAND  Final   Special Requests   Final    BOTTLES DRAWN AEROBIC ONLY Blood Culture results may not be optimal due to an inadequate volume of blood received in culture bottles   Culture  Setup Time   Final    GRAM POSITIVE COCCI AEROBIC BOTTLE ONLY CRITICAL VALUE NOTED.  VALUE IS CONSISTENT WITH PREVIOUSLY REPORTED AND CALLED VALUE. Performed at [facility], [address], [place], [place] [address]    Culture STAPHYLOCOCCUS EPIDERMIDIS (A)  Final   Report Status 09/22/2023 FINAL  Final   Organism ID, Bacteria STAPHYLOCOCCUS EPIDERMIDIS  Final      Susceptibility   Staphylococcus epidermidis - MIC*    CIPROFLOXACIN <=0.5 SENSITIVE Sensitive     ERYTHROMYCIN <=0.25 SENSITIVE Sensitive     GENTAMICIN <=0.5 SENSITIVE Sensitive     OXACILLIN  <=0.25 SENSITIVE Sensitive     TETRACYCLINE 2 SENSITIVE Sensitive     VANCOMYCIN 2 SENSITIVE Sensitive     TRIMETH/SULFA 20 SENSITIVE Sensitive     CLINDAMYCIN <=0.25 SENSITIVE Sensitive     RIFAMPIN <=0.5 SENSITIVE Sensitive     Inducible Clindamycin NEGATIVE Sensitive     * STAPHYLOCOCCUS EPIDERMIDIS  Culture, blood (single) w Reflex to ID Panel     Status: None (Preliminary result)   Collection Time: 09/21/23  8:13 AM   Specimen: BLOOD LEFT ARM  Result Value Ref Range Status   Specimen Description BLOOD LEFT ARM  Final   Special Requests   Final    BOTTLES DRAWN AEROBIC AND ANAEROBIC Blood Culture adequate volume   Culture   Final    NO GROWTH < 24 HOURS Performed at [facility], [address] [place], [place] [address]    Report Status PENDING  Incomplete         Radiology Studies: No results found.       Scheduled Meds:  aspirin  81 mg Oral Daily   bacitracin   Topical BID   Chlorhexidine Gluconate Cloth  6 each Topical Q0600   cinacalcet  30 mg Oral Q M,W,F-HD   doxercalciferol  8 mcg Intravenous Q M,W,F-HD   fluticasone  1 spray Each Nare BID   guaiFENesin  600 mg Oral BID   heparin injection (subcutaneous)  5,000 Units Subcutaneous Q8H   hydrALAZINE  25 mg Oral BID   insulin aspart  0-6 Units Subcutaneous TID WC   ketoconazole   Topical Daily   levETIRAcetam  500 mg Oral Daily   sevelamer carbonate  1,600 mg Oral TID WC   Continuous Infusions:   ceFAZolin (ANCEF) IV Stopped (09/21/23 1653)     LOS: 4 days       Huey Bienenstock, MD [facility]   To contact the attending provider between 7A-7P or the covering  provider during after hours 7P-7A, please log into the web site [email or web address] and access using universal [place] password for that web site. If you do not have the password, please call the hospital operator.  09/22/2023, 1:56 PM

## 2023-09-23 ENCOUNTER — Inpatient Hospital Stay (HOSPITAL_COMMUNITY): Payer: Medicare Other

## 2023-09-23 ENCOUNTER — Encounter (HOSPITAL_COMMUNITY): Payer: Self-pay | Admitting: Internal Medicine

## 2023-09-23 ENCOUNTER — Encounter (HOSPITAL_COMMUNITY)
Admission: EM | Disposition: A | Discharge status: Home Health | Payer: Self-pay | Source: Home / Self Care | Attending: Internal Medicine

## 2023-09-23 DIAGNOSIS — R7881 Bacteremia: Secondary | ICD-10-CM | POA: Diagnosis not present

## 2023-09-23 DIAGNOSIS — N186 End stage renal disease: Secondary | ICD-10-CM

## 2023-09-23 DIAGNOSIS — I340 Nonrheumatic mitral (valve) insufficiency: Secondary | ICD-10-CM | POA: Diagnosis not present

## 2023-09-23 DIAGNOSIS — I509 Heart failure, unspecified: Secondary | ICD-10-CM | POA: Diagnosis not present

## 2023-09-23 DIAGNOSIS — I5032 Chronic diastolic (congestive) heart failure: Secondary | ICD-10-CM | POA: Diagnosis not present

## 2023-09-23 DIAGNOSIS — J101 Influenza due to other identified influenza virus with other respiratory manifestations: Secondary | ICD-10-CM | POA: Diagnosis not present

## 2023-09-23 DIAGNOSIS — E1122 Type 2 diabetes mellitus with diabetic chronic kidney disease: Secondary | ICD-10-CM

## 2023-09-23 DIAGNOSIS — G9341 Metabolic encephalopathy: Secondary | ICD-10-CM | POA: Diagnosis not present

## 2023-09-23 DIAGNOSIS — I132 Hypertensive heart and chronic kidney disease with heart failure and with stage 5 chronic kidney disease, or end stage renal disease: Secondary | ICD-10-CM

## 2023-09-23 DIAGNOSIS — J9601 Acute respiratory failure with hypoxia: Secondary | ICD-10-CM | POA: Diagnosis not present

## 2023-09-23 HISTORY — PX: TRANSESOPHAGEAL ECHOCARDIOGRAM (CATH LAB): EP1270

## 2023-09-23 LAB — CULTURE, BLOOD (ROUTINE X 2)

## 2023-09-23 LAB — BASIC METABOLIC PANEL
Anion gap: 16 — ABNORMAL HIGH (ref 5–15)
BUN: 35 mg/dL — ABNORMAL HIGH (ref 8–23)
CO2: 23 mmol/L (ref 22–32)
Calcium: 9.5 mg/dL (ref 8.9–10.3)
Chloride: 92 mmol/L — ABNORMAL LOW (ref 98–111)
Creatinine, Ser: 7.9 mg/dL — ABNORMAL HIGH (ref 0.61–1.24)
GFR, Estimated: 7 mL/min — ABNORMAL LOW (ref >60)
Glucose, Bld: 70 mg/dL (ref 70–99)
Potassium: 5.2 mmol/L — ABNORMAL HIGH (ref 3.5–5.1)
Sodium: 131 mmol/L — ABNORMAL LOW (ref 135–145)

## 2023-09-23 LAB — GLUCOSE, CAPILLARY
Glucose-Capillary: 99 mg/dL (ref 70–99)
Glucose-Capillary: 62 mg/dL — ABNORMAL LOW (ref 70–99)
Glucose-Capillary: 95 mg/dL (ref 70–99)
Glucose-Capillary: 91 mg/dL (ref 70–99)
Glucose-Capillary: 97 mg/dL (ref 70–99)

## 2023-09-23 LAB — CBC
HCT: 33.9 % — ABNORMAL LOW (ref 39.0–52.0)
Hemoglobin: 10.2 g/dL — ABNORMAL LOW (ref 13.0–17.0)
MCH: 25.4 pg — ABNORMAL LOW (ref 26.0–34.0)
MCHC: 30.1 g/dL (ref 30.0–36.0)
MCV: 84.5 fL (ref 80.0–100.0)
Platelets: 153 10*3/uL (ref 150–400)
RBC: 4.01 MIL/uL — ABNORMAL LOW (ref 4.22–5.81)
RDW: 18.8 % — ABNORMAL HIGH (ref 11.5–15.5)
WBC: 5.5 10*3/uL (ref 4.0–10.5)
nRBC: 0 % (ref 0.0–0.2)

## 2023-09-23 LAB — PHOSPHORUS: Phosphorus: 4.9 mg/dL — ABNORMAL HIGH (ref 2.5–4.6)

## 2023-09-23 LAB — ECHO TEE

## 2023-09-23 SURGERY — TRANSESOPHAGEAL ECHOCARDIOGRAM (TEE) (CATHLAB)
Anesthesia: Monitor Anesthesia Care

## 2023-09-23 MED ORDER — SODIUM CHLORIDE 0.9 % IV SOLN: INTRAVENOUS | Status: DC | PRN

## 2023-09-23 MED ORDER — LIDOCAINE 2% (20 MG/ML) 5 ML SYRINGE
INTRAMUSCULAR | Status: DC | PRN
  Administered 2023-09-23: 60 mg via INTRAVENOUS

## 2023-09-23 MED ORDER — GLYCOPYRROLATE 0.2 MG/ML IJ SOLN
INTRAMUSCULAR | Status: DC | PRN
Start: 2023-09-23 — End: 2023-09-23
  Administered 2023-09-23: .2 mg via INTRAVENOUS

## 2023-09-23 MED ORDER — AMLODIPINE BESYLATE 10 MG PO TABS
10.0000 mg | ORAL_TABLET | Freq: Every day | ORAL | Status: DC
  Administered 2023-09-23: 10 mg via ORAL
  Filled 2023-09-23: qty 1

## 2023-09-23 MED ORDER — CEFAZOLIN SODIUM-DEXTROSE 2-4 GM/100ML-% IV SOLN
2.0000 g | Freq: Once | INTRAVENOUS | Status: AC
  Administered 2023-09-23: 2 g via INTRAVENOUS
  Filled 2023-09-23: qty 100

## 2023-09-23 MED ORDER — PROPOFOL 10 MG/ML IV BOLUS
INTRAVENOUS | Status: DC | PRN
  Administered 2023-09-23: 60 mg via INTRAVENOUS
  Administered 2023-09-23: 20 mg via INTRAVENOUS

## 2023-09-23 MED ORDER — CARVEDILOL 25 MG PO TABS
25.0000 mg | ORAL_TABLET | Freq: Two times a day (BID) | ORAL | Status: DC
  Administered 2023-09-23 – 2023-09-24 (×2): 25 mg via ORAL
  Filled 2023-09-23 (×2): qty 1

## 2023-09-23 MED ORDER — PROPOFOL 500 MG/50ML IV EMUL
INTRAVENOUS | Status: DC | PRN
Start: 2023-09-23 — End: 2023-09-23
  Administered 2023-09-23: 175 ug/kg/min via INTRAVENOUS

## 2023-09-23 MED ORDER — PHENYLEPHRINE HCL-NACL 20-0.9 MG/250ML-% IV SOLN
INTRAVENOUS | Status: DC | PRN
  Administered 2023-09-23: 20 ug/min via INTRAVENOUS

## 2023-09-23 MED ORDER — SODIUM ZIRCONIUM CYCLOSILICATE 10 G PO PACK
10.0000 g | PACK | Freq: Every day | ORAL | Status: AC
  Administered 2023-09-23: 10 g via ORAL
  Filled 2023-09-23: qty 1

## 2023-09-23 NOTE — Procedures (Signed)
I was present at this dialysis session. I have reviewed the session and made appropriate changes.   K of 5.2 on 2K bath.  UF goal of 1L.  Using RUE AVF.  No issues dong well.  For TEE.    Filed Weights   09/21/23 0950 09/21/23 1355 09/23/23 0500  Weight: 70.4 kg 69.5 kg 67.1 kg    Recent Labs  Lab 09/23/23 0501  NA 131*  K 5.2*  CL 92*  CO2 23  GLUCOSE 70  BUN 35*  CREATININE 7.90*  CALCIUM 9.5  PHOS 4.9*    Recent Labs  Lab 09/18/23 1929 09/19/23 0458 09/20/23 0554 09/21/23 0506 09/23/23 0501  WBC 10.5 8.7 9.2 9.9 5.5  NEUTROABS 9.3* 7.4 7.6  --   --   HGB 11.4* 10.2* 11.6* 11.0* 10.2*  HCT 37.5* 33.6* 37.2* 37.0* 33.9*  MCV 84.5 85.5 83.4 86.9 84.5  PLT 123* 114* 123* 108* 153    Scheduled Meds:  aspirin  81 mg Oral Daily   bacitracin   Topical BID   Chlorhexidine Gluconate Cloth  6 each Topical Q0600   cinacalcet  30 mg Oral Q M,W,F-HD   doxercalciferol  8 mcg Intravenous Q M,W,F-HD   fluticasone  1 spray Each Nare BID   guaiFENesin  600 mg Oral BID   heparin injection (subcutaneous)  5,000 Units Subcutaneous Q8H   hydrALAZINE  25 mg Oral BID   insulin aspart  0-6 Units Subcutaneous TID WC   ketoconazole   Topical Daily   levETIRAcetam  500 mg Oral Daily   sevelamer carbonate  1,600 mg Oral TID WC   Continuous Infusions:   ceFAZolin (ANCEF) IV Stopped (09/21/23 1653)   PRN Meds:.acetaminophen **OR** acetaminophen, benzonatate, clonazePAM, dextrose, lactulose, melatonin, ondansetron (ZOFRAN) IV, polyethylene glycol   Greg Heck  MD 09/23/2023, 11:42 AM

## 2023-09-23 NOTE — Progress Notes (Incomplete)
PHARMACY CONSULT NOTE FOR:  OUTPATIENT  PARENTERAL ANTIBIOTIC THERAPY (OPAT)  Indication:  Regimen: Cefazolin 2g/HD-MWF End date: *** (neg BCx 2/10)  IV antibiotic discharge orders are pended. To discharging provider:  please sign these orders via discharge navigator,  Select New Orders & click on the button choice - Manage This Unsigned Work.     Thank you for allowing pharmacy to be a part of this patient's care.  [name] 09/23/2023, 4:03 PM

## 2023-09-23 NOTE — Plan of Care (Signed)
Problem: Fluid Volume: Goal: Ability to maintain a balanced intake and output will improve Outcome: Progressing   Problem: Metabolic: Goal: Ability to maintain appropriate glucose levels will improve Outcome: Progressing   Problem: Skin Integrity: Goal: Risk for impaired skin integrity will decrease Outcome: Progressing

## 2023-09-23 NOTE — Progress Notes (Signed)
PT Cancellation Note  Patient Details Name: [name] MRN: [id number] DOB: 1951-01-27   Cancelled Treatment:    Reason Eval/Treat Not Completed: (P) Patient at procedure or test/unavailable (Pt off floor at HD. Will follow up as able.)   Johny Shock 09/23/2023, 12:00 PM

## 2023-09-23 NOTE — Anesthesia Postprocedure Evaluation (Signed)
Anesthesia Post Note  Patient: [name]  Procedure(s) Performed: TRANSESOPHAGEAL ECHOCARDIOGRAM     Patient location during evaluation: PACU Anesthesia Type: MAC Level of consciousness: awake and alert Pain management: pain level controlled Vital Signs Assessment: post-procedure vital signs reviewed and stable Respiratory status: spontaneous breathing, nonlabored ventilation, respiratory function stable and patient connected to nasal cannula oxygen Cardiovascular status: stable and blood pressure returned to baseline Postop Assessment: no apparent nausea or vomiting Anesthetic complications: no  No notable events documented.  Last Vitals:  Vitals:   09/23/23 1551 09/23/23 1603  BP: (!) 151/72 (!) 156/76  Pulse: 78 79  Resp: 17 18  Temp:    SpO2: 92% 91%    Last Pain:  Vitals:   09/23/23 1551  TempSrc:   PainSc: 0-No pain                 Trevor Iha

## 2023-09-23 NOTE — Procedures (Signed)
    TRANSESOPHAGEAL ECHOCARDIOGRAM   NAME:  [name]    MRN: [id number] DOB:  [birth year]    ADMIT DATE: 09/18/2023  INDICATIONS: Bacteremia  PROCEDURE:   Informed consent was obtained prior to the procedure. The risks, benefits and alternatives for the procedure were discussed and the patient comprehended these risks.  Risks include, but are not limited to, cough, sore throat, vomiting, nausea, somnolence, esophageal and stomach trauma or perforation, bleeding, low blood pressure, aspiration, pneumonia, infection, trauma to the teeth and death.    Procedural time out performed.   Anesthesia was administered by Dr. Charlynn Grimes and team.  The patient's heart rate, blood pressure, and oxygen saturation are monitored continuously during the procedure.   The transesophageal probe was inserted in the esophagus and stomach without difficulty and multiple views were obtained.   COMPLICATIONS:    There were no immediate complications.  KEY FINDINGS:  Small (< 1 cm ) hypermobile echodensity of the anterior leaflet of his mitral valve. Though he has degenerative calcification, cannot rule out small vegetation.  Severe tricuspid regurgitation with IVC dilation and hepatic vein flow reversal. Imaged immediately post dialysis.  Full report to follow. Further management per primary team.   Riley Lam, MD [place]  [facility]  3:30 PM

## 2023-09-23 NOTE — Progress Notes (Signed)
[facility]  Date of Admission:  09/18/2023     Total days of antibiotics 5         ASSESSMENT:  Mr. Czajka blood cultures from 09/21/2023 remain without growth to date in the setting of of sensitive Staphylococcus epidermidis bacteremia.  Scheduled for TEE today to rule out endocarditis.  Tolerating antibiotics with no adverse side effects.  Reviewed plan of care to continue with current dose of cefazolin.  Final recommendations pending clearance of blood cultures and TEE results.  Droplet precautions discontinued as it has been greater than 7 days since diagnosis.  Remaining medical and supportive care per Internal Medicine and Nephrology.   PLAN:  Continue current dose of cefazolin. Monitor blood cultures for clearance of bacteremia. Await results of TEE to rule out endocarditis. Discontinue droplet precautions. Remaining medical and supportive care per Internal Medicine and Nephrology.   Principal Problem:   Influenza A Active Problems:   Essential hypertension   ESRD on dialysis (HCC)   History of anemia due to chronic kidney disease   Chronic diastolic CHF (congestive heart failure) (HCC)   Acute respiratory failure with hypoxia (HCC)   DM2 (diabetes mellitus, type 2) (HCC)   Severe sepsis (HCC)   Acute metabolic encephalopathy   Hypoglycemia   History of seizures    [MAR Hold] amLODipine  10 mg Oral QHS   [MAR Hold] aspirin  81 mg Oral Daily   [MAR Hold] bacitracin   Topical BID   [MAR Hold] carvedilol  25 mg Oral BID WC   [MAR Hold] Chlorhexidine Gluconate Cloth  6 each Topical Q0600   [MAR Hold] cinacalcet  30 mg Oral Q M,W,F-HD   [MAR Hold] doxercalciferol  8 mcg Intravenous Q M,W,F-HD   [MAR Hold] fluticasone  1 spray Each Nare BID   [MAR Hold] guaiFENesin  600 mg Oral BID   [MAR Hold] heparin injection (subcutaneous)  5,000 Units Subcutaneous Q8H   [MAR Hold] hydrALAZINE  25 mg Oral BID   [MAR Hold] insulin aspart  0-6 Units  Subcutaneous TID WC   ketoconazole   Topical Daily   [MAR Hold] levETIRAcetam  500 mg Oral Daily   [MAR Hold] sevelamer carbonate  1,600 mg Oral TID WC    SUBJECTIVE:  Afebrile overnight with no acute events.  No new concerns/complaints.  Tolerating antibiotics.  No Known Allergies   Review of Systems: Review of Systems  Constitutional:  Negative for chills, fever and weight loss.  Respiratory:  Negative for cough, shortness of breath and wheezing.   Cardiovascular:  Negative for chest pain and leg swelling.  Gastrointestinal:  Negative for abdominal pain, constipation, diarrhea, nausea and vomiting.  Skin:  Negative for rash.      OBJECTIVE: Vitals:   09/23/23 1335 09/23/23 1346 09/23/23 1351 09/23/23 1434  BP: (!) 172/83 (!) 173/75 (!) 173/82   Pulse: 71 73 75 78  Resp: 17     Temp:  98.4 F (36.9 C)  98.3 F (36.8 C)  TempSrc:    Temporal  SpO2: 98% 100% 98% 94%  Weight:       Body mass index is 20.63 kg/m.  Physical Exam Constitutional:      General: He is not in acute distress.    Appearance: He is well-developed.  Cardiovascular:     Rate and Rhythm: Normal rate and regular rhythm.     Heart sounds: Normal heart sounds.  Pulmonary:     Effort: Pulmonary effort is normal.  Breath sounds: Normal breath sounds.  Skin:    General: Skin is warm and dry.  Neurological:     Mental Status: He is alert and oriented to person, place, and time.  Psychiatric:        Mood and Affect: Mood normal.     Lab Results Lab Results  Component Value Date   WBC 5.5 09/23/2023   HGB 10.2 (L) 09/23/2023   HCT 33.9 (L) 09/23/2023   MCV 84.5 09/23/2023   PLT 153 09/23/2023    Lab Results  Component Value Date   CREATININE 7.90 (H) 09/23/2023   BUN 35 (H) 09/23/2023   NA 131 (L) 09/23/2023   K 5.2 (H) 09/23/2023   CL 92 (L) 09/23/2023   CO2 23 09/23/2023    Lab Results  Component Value Date   ALT 37 09/19/2023   AST 22 09/19/2023   ALKPHOS 72 09/19/2023    BILITOT 0.7 09/19/2023     Microbiology: Recent Results (from the past 240 hours)  Resp panel by RT-PCR (RSV, Flu A&B, Covid) Peripheral     Status: Abnormal   Collection Time: 09/18/23  6:57 PM   Specimen: Peripheral; Nasal Swab  Result Value Ref Range Status   SARS Coronavirus 2 by RT PCR NEGATIVE NEGATIVE Final   Influenza A by PCR POSITIVE (A) NEGATIVE Final   Influenza B by PCR NEGATIVE NEGATIVE Final    Comment: (NOTE) The Xpert Xpress SARS-CoV-2/FLU/RSV plus assay is intended as an aid in the diagnosis of influenza from Nasopharyngeal swab specimens and should not be used as a sole basis for treatment. Nasal washings and aspirates are unacceptable for Xpert Xpress SARS-CoV-2/FLU/RSV testing.  Fact Sheet for Patients: BloggerCourse.com  Fact Sheet for Healthcare Providers: SeriousBroker.it  This test is not yet approved or cleared by the [place] FDA and has been authorized for detection and/or diagnosis of SARS-CoV-2 by FDA under an Emergency Use Authorization (EUA). This EUA will remain in effect (meaning this test can be used) for the duration of the COVID-19 declaration under Section 564(b)(1) of the Act, 21 U.S.C. section 360bbb-3(b)(1), unless the authorization is terminated or revoked.     Resp Syncytial Virus by PCR NEGATIVE NEGATIVE Final    Comment: (NOTE) Fact Sheet for Patients: BloggerCourse.com  Fact Sheet for Healthcare Providers: SeriousBroker.it  This test is not yet approved or cleared by the [place] FDA and has been authorized for detection and/or diagnosis of SARS-CoV-2 by FDA under an Emergency Use Authorization (EUA). This EUA will remain in effect (meaning this test can be used) for the duration of the COVID-19 declaration under Section 564(b)(1) of the Act, 21 U.S.C. section 360bbb-3(b)(1), unless the authorization is terminated  or revoked.  Performed at [facility], [address] [place], [place] [address]   Blood Culture (routine x 2)     Status: Abnormal   Collection Time: 09/18/23  7:00 PM   Specimen: BLOOD LEFT ARM  Result Value Ref Range Status   Specimen Description BLOOD LEFT ARM  Final   Special Requests   Final    BOTTLES DRAWN AEROBIC AND ANAEROBIC Blood Culture results may not be optimal due to an inadequate volume of blood received in culture bottles   Culture  Setup Time   Final    GRAM POSITIVE COCCI IN BOTH AEROBIC AND ANAEROBIC BOTTLES CRITICAL RESULT CALLED TO, READ BACK BY AND VERIFIED WITH: PHARMD [id number] AT 1622 CAREN A., ADC Performed at [facility], [address] [place], [place] [address]    Culture STAPHYLOCOCCUS EPIDERMIDIS (A)  Final   Report Status 09/23/2023 FINAL  Final   Organism ID, Bacteria STAPHYLOCOCCUS EPIDERMIDIS  Final      Susceptibility   Staphylococcus epidermidis - MIC*    CIPROFLOXACIN <=0.5 SENSITIVE Sensitive     ERYTHROMYCIN <=0.25 SENSITIVE Sensitive     GENTAMICIN <=0.5 SENSITIVE Sensitive     OXACILLIN <=0.25 SENSITIVE Sensitive     TETRACYCLINE 2 SENSITIVE Sensitive     VANCOMYCIN 2 SENSITIVE Sensitive     TRIMETH/SULFA 20 SENSITIVE Sensitive     CLINDAMYCIN <=0.25 SENSITIVE Sensitive     RIFAMPIN <=0.5 SENSITIVE Sensitive     Inducible Clindamycin NEGATIVE Sensitive     * STAPHYLOCOCCUS EPIDERMIDIS  Blood Culture ID Panel (Reflexed)     Status: Abnormal   Collection Time: 09/18/23  7:00 PM  Result Value Ref Range Status   Enterococcus faecalis NOT DETECTED NOT DETECTED Final   Enterococcus Faecium NOT DETECTED NOT DETECTED Final   Listeria monocytogenes NOT DETECTED NOT DETECTED Final   Staphylococcus species DETECTED (A) NOT DETECTED Final    Comment: CRITICAL RESULT CALLED TO, READ BACK BY AND VERIFIED WITH: PHARMD [id number] AT 1622 CAREN A., ADC    Staphylococcus aureus (BCID) NOT DETECTED NOT DETECTED Final   Staphylococcus  epidermidis DETECTED (A) NOT DETECTED Final    Comment: CRITICAL RESULT CALLED TO, READ BACK BY AND VERIFIED WITH: PHARMD [id number] AT 1622 CAREN A., ADC    Staphylococcus lugdunensis NOT DETECTED NOT DETECTED Final   Streptococcus species NOT DETECTED NOT DETECTED Final   Streptococcus agalactiae NOT DETECTED NOT DETECTED Final   Streptococcus pneumoniae NOT DETECTED NOT DETECTED Final   Streptococcus pyogenes NOT DETECTED NOT DETECTED Final   A.calcoaceticus-baumannii NOT DETECTED NOT DETECTED Final   Bacteroides fragilis NOT DETECTED NOT DETECTED Final   Enterobacterales NOT DETECTED NOT DETECTED Final   Enterobacter cloacae complex NOT DETECTED NOT DETECTED Final   Escherichia coli NOT DETECTED NOT DETECTED Final   Klebsiella aerogenes NOT DETECTED NOT DETECTED Final   Klebsiella oxytoca NOT DETECTED NOT DETECTED Final   Klebsiella pneumoniae NOT DETECTED NOT DETECTED Final   Proteus species NOT DETECTED NOT DETECTED Final   Salmonella species NOT DETECTED NOT DETECTED Final   Serratia marcescens NOT DETECTED NOT DETECTED Final   Haemophilus influenzae NOT DETECTED NOT DETECTED Final   Neisseria meningitidis NOT DETECTED NOT DETECTED Final   Pseudomonas aeruginosa NOT DETECTED NOT DETECTED Final   Stenotrophomonas maltophilia NOT DETECTED NOT DETECTED Final   Candida albicans NOT DETECTED NOT DETECTED Final   Candida auris NOT DETECTED NOT DETECTED Final   Candida glabrata NOT DETECTED NOT DETECTED Final   Candida krusei NOT DETECTED NOT DETECTED Final   Candida parapsilosis NOT DETECTED NOT DETECTED Final   Candida tropicalis NOT DETECTED NOT DETECTED Final   Cryptococcus neoformans/gattii NOT DETECTED NOT DETECTED Final   Methicillin resistance mecA/C NOT DETECTED NOT DETECTED Final    Comment: Performed at [facility], [address], [place], [place] [address]  Blood Culture (routine x 2)     Status: Abnormal   Collection Time: 09/18/23  7:29 PM   Specimen: BLOOD LEFT  HAND  Result Value Ref Range Status   Specimen Description BLOOD LEFT HAND  Final   Special Requests   Final    BOTTLES DRAWN AEROBIC ONLY Blood Culture results may not be optimal due to an inadequate volume of blood received in culture bottles  Culture  Setup Time   Final    GRAM POSITIVE COCCI AEROBIC BOTTLE ONLY CRITICAL VALUE NOTED.  VALUE IS CONSISTENT WITH PREVIOUSLY REPORTED AND CALLED VALUE. Performed at [facility], [address] [place], [place] [address]    Culture STAPHYLOCOCCUS EPIDERMIDIS (A)  Final   Report Status 09/22/2023 FINAL  Final   Organism ID, Bacteria STAPHYLOCOCCUS EPIDERMIDIS  Final      Susceptibility   Staphylococcus epidermidis - MIC*    CIPROFLOXACIN <=0.5 SENSITIVE Sensitive     ERYTHROMYCIN <=0.25 SENSITIVE Sensitive     GENTAMICIN <=0.5 SENSITIVE Sensitive     OXACILLIN <=0.25 SENSITIVE Sensitive     TETRACYCLINE 2 SENSITIVE Sensitive     VANCOMYCIN 2 SENSITIVE Sensitive     TRIMETH/SULFA 20 SENSITIVE Sensitive     CLINDAMYCIN <=0.25 SENSITIVE Sensitive     RIFAMPIN <=0.5 SENSITIVE Sensitive     Inducible Clindamycin NEGATIVE Sensitive     * STAPHYLOCOCCUS EPIDERMIDIS  Culture, blood (single) w Reflex to ID Panel     Status: None (Preliminary result)   Collection Time: 09/21/23  8:13 AM   Specimen: BLOOD LEFT ARM  Result Value Ref Range Status   Specimen Description BLOOD LEFT ARM  Final   Special Requests   Final    BOTTLES DRAWN AEROBIC AND ANAEROBIC Blood Culture adequate volume   Culture   Final    NO GROWTH 2 DAYS Performed at [facility], [address] [place], [place] [address]    Report Status PENDING  Incomplete     Marcos Eke, NP [facility]  09/23/2023  2:48 PM

## 2023-09-23 NOTE — Interval H&P Note (Signed)
History and Physical Interval Note:  09/23/2023 2:49 PM  [name]  has presented today for surgery, with the diagnosis of bactermia.  The various methods of treatment have been discussed with the patient and family. After consideration of risks, benefits and other options for treatment, the patient has consented to  Procedure(s): TRANSESOPHAGEAL ECHOCARDIOGRAM (N/A) as a surgical intervention.  The patient's history has been reviewed, patient examined, no change in status, stable for surgery.  I have reviewed the patient's chart and labs.  Questions were answered to the patient's satisfaction.    Unable to reach wife by phone.  Patient consented prior to procedure (see note PA [name]).  At time of evaluation, concurrent with anesthesiologist, CRNA, and nursing team, patient understood procedure, risks, and alternatives.  Was able to voice them back in [place].  Gave his consent.  We will proceed.  [name]

## 2023-09-23 NOTE — Plan of Care (Signed)
  Problem: Education: Goal: Ability to describe self-care measures that may prevent or decrease complications (Diabetes Survival Skills Education) will improve Outcome: Progressing   Problem: Coping: Goal: Ability to adjust to condition or change in health will improve Outcome: Progressing   Problem: Skin Integrity: Goal: Risk for impaired skin integrity will decrease Outcome: Progressing   Problem: Tissue Perfusion: Goal: Adequacy of tissue perfusion will improve Outcome: Progressing   Problem: Education: Goal: Ability to describe self-care measures that may prevent or decrease complications (Diabetes Survival Skills Education) will improve Outcome: Progressing   Problem: Coping: Goal: Ability to adjust to condition or change in health will improve Outcome: Progressing   Problem: Skin Integrity: Goal: Risk for impaired skin integrity will decrease Outcome: Progressing   Problem: Tissue Perfusion: Goal: Adequacy of tissue perfusion will improve Outcome: Progressing

## 2023-09-23 NOTE — Anesthesia Preprocedure Evaluation (Signed)
Anesthesia Evaluation    History of Anesthesia Complications (+) AWARENESS UNDER ANESTHESIA and history of anesthetic complications  Airway Mallampati: II  TM Distance: >3 FB Neck ROM: Full    Dental no notable dental hx.    Pulmonary pneumonia Recent influenza PNA with AHFR. Now weaned of O2 support.    Pulmonary exam normal breath sounds clear to auscultation       Cardiovascular hypertension, + Peripheral Vascular Disease and +CHF  Normal cardiovascular exam Rhythm:Regular Rate:Normal     Neuro/Psych  Headaches, Seizures -,     GI/Hepatic   Endo/Other  diabetes    Renal/GU Renal disease     Musculoskeletal   Abdominal   Peds  Hematology  (+) Blood dyscrasia, anemia   Anesthesia Other Findings Rule out endocarditis in the setting of steph epi bacteremia.   Reproductive/Obstetrics                              Anesthesia Physical Anesthesia Plan  ASA: 3  Anesthesia Plan: MAC   Post-op Pain Management:    Induction: Intravenous  PONV Risk Score and Plan: 1 and Propofol infusion and Treatment may vary due to age or medical condition  Airway Management Planned: Natural Airway  Additional Equipment:   Intra-op Plan:   Post-operative Plan:   Informed Consent: I have reviewed the patients History and Physical, chart, labs and discussed the procedure including the risks, benefits and alternatives for the proposed anesthesia with the patient or authorized representative who has indicated his/her understanding and acceptance.     Dental advisory given  Plan Discussed with: CRNA  Anesthesia Plan Comments: (Patient is a 73 y.o.  male with history of ESRD, DM-2, HTN-who was recently diagnosed with influenza and treated with Tamiflu-presented with fever/lethargy-found to have Staph epidermidis bacteremia.)         Anesthesia Quick Evaluation

## 2023-09-23 NOTE — Progress Notes (Signed)
Received patient in bed to unit.  Alert and oriented.  Informed consent signed and in chart.   TX duration:3.5 hours  Patient tolerated well.  Transported back to the Cath lab with nurse and transporter. Report given to [facility] in Cath lab. Post HD BS result 62, Dextrose 50 % 50 ml given to patient. Inform Nurse in Cath Lab to recheck B/S.  Alert, without acute distress.  Hand-off given to patient's nurse.   Access used:  RVF Access issues: None  Total UF removed: Medication(s) given: See MAR  Laqueta Due, RN [facility] D[facility] Unit

## 2023-09-23 NOTE — CV Procedure (Signed)
      TRANSESOPHAGEAL ECHOCARDIOGRAM   NAME:  [name]    MRN: [id number] DOB:  [birth year]    ADMIT DATE: 09/18/2023  INDICATIONS: Bacteremia  PROCEDURE:   Informed consent was obtained prior to the procedure. The risks, benefits and alternatives for the procedure were discussed and the patient comprehended these risks.  Risks include, but are not limited to, cough, sore throat, vomiting, nausea, somnolence, esophageal and stomach trauma or perforation, bleeding, low blood pressure, aspiration, pneumonia, infection, trauma to the teeth and death.    Procedural time out performed.   Anesthesia was administered by Dr. Charlynn Grimes and team.  The patient's heart rate, blood pressure, and oxygen saturation are monitored continuously during the procedure.   The transesophageal probe was inserted in the esophagus and stomach without difficulty and multiple views were obtained.   COMPLICATIONS:    There were no immediate complications.  KEY FINDINGS:  Small (< 1 cm ) hypermobile echodensity of the anterior leaflet of his mitral valve. Though he has degenerative calcification, cannot rule out small vegetation.  Severe tricuspid regurgitation with IVC dilation and hepatic vein flow reversal. Imaged immediately post dialysis.  Full report to follow. Further management per primary team.   Riley Lam, MD [place]  [facility]  3:30 PM

## 2023-09-23 NOTE — Transfer of Care (Signed)
Immediate Anesthesia Transfer of Care Note  Patient: [name]  Procedure(s) Performed: TRANSESOPHAGEAL ECHOCARDIOGRAM  Patient Location: PACU  Anesthesia Type:MAC  Level of Consciousness: drowsy  Airway & Oxygen Therapy: Patient Spontanous Breathing and Patient connected to nasal cannula oxygen  Post-op Assessment: Report given to RN and Post -op Vital signs reviewed and stable  Post vital signs: Reviewed and stable  Last Vitals:  Vitals Value Taken Time  BP    Temp    Pulse    Resp    SpO2      Last Pain:  Vitals:   09/23/23 1434  TempSrc: Temporal  PainSc:       Patients Stated Pain Goal: 0 (09/23/23 0800)  Complications: No notable events documented.

## 2023-09-23 NOTE — Progress Notes (Signed)
PROGRESS NOTE        PATIENT DETAILS Name: [name] Age: 73 y.o. Sex: male Date of Birth: [birth year] Admit Date: 09/18/2023 Admitting Physician Angie Fava, DO ZOX:[id number], Bernadene Bell, MD  Brief Summary: Patient is a 73 y.o.  male with history of ESRD, DM-2, HTN-who was recently diagnosed with influenza and treated with Tamiflu-presented with fever/lethargy-found to have Staph epidermidis bacteremia.  Significant events: 1/27-2/2>> possible cessation-altered mental status-influenza 2/7>> fever- admit to [facility].  Significant studies: 2/7>> CXR: Negative for PNA 2/7>> CT head: No acute intracranial process 2/8>> echo: EF 45-50%  Significant microbiology data: 2/7>> influenza A positive 2/7>> COVID/RSV: Negative 2/7>> blood culture: Staph epidermidis 2/10>> blood culture: No growth  Procedures: None  Consults: ID Renal  Subjective: Lying comfortably in bed-denies any chest pain or shortness of breath.  Objective: Vitals: Blood pressure (!) 172/75, pulse 70, temperature 97.7 F (36.5 C), resp. rate 17, weight 67.1 kg, SpO2 100%.   Exam: Gen Exam:Alert awake-not in any distress HEENT:atraumatic, normocephalic Chest: B/L clear to auscultation anteriorly CVS:S1S2 regular Abdomen:soft non tender, non distended Extremities:no edema Neurology: Non focal Skin: no rash  Pertinent Labs/Radiology:    Latest Ref Rng & Units 09/23/2023    5:01 AM 09/21/2023    5:06 AM 09/20/2023    5:54 AM  CBC  WBC 4.0 - 10.5 K/uL 5.5  9.9  9.2   Hemoglobin 13.0 - 17.0 g/dL 54.0  98.1  19.1   Hematocrit 39.0 - 52.0 % 33.9  37.0  37.2   Platelets 150 - 400 K/uL 153  108  123     Lab Results  Component Value Date   NA 131 (L) 09/23/2023   K 5.2 (H) 09/23/2023   CL 92 (L) 09/23/2023   CO2 23 09/23/2023      Assessment/Plan: Sepsis secondary to Staph epidermidis bacteremia Sepsis physiology has resolved Repeat cultures negative TTE negative for  vegetation TEE scheduled for later today Continue IV Ancef-ID following.  Acute hypoxic respiratory failure secondary to influenza A infection Hypoxia resolved-on room air Recently completed Tamiflu-prior to this hospitalization.  Acute metabolic encephalopathy Likely secondary to sepsis/bacteremia Resolved-completely awake and alert  ESRD on HD MWF Nephrology following  Normocytic anemia Hb stable Aranesp/iron defer to nephrology service.  Thrombocytopenia Likely due to influenza Resolved.  Seizure disorder Keppra  Chronic combined systolic/diastolic heart failure Euvolemic Volume removal with hemodialysis  HTN BP uncontrolled today Continue hydralazine Resume amlodipine/Coreg Follow/adjust  Debility/deconditioning PT/OT  BMI: Estimated body mass index is 20.63 kg/m as calculated from the following:   Height as of 09/15/23: 5\' 11"  (1.803 m).   Weight as of this encounter: 67.1 kg.   Code status:   Code Status: Full Code   DVT Prophylaxis: heparin injection 5,000 Units Start: 09/20/23 1445 SCDs Start: 09/18/23 2130   Family Communication: Spouse at bedside   Disposition Plan: Status is: Inpatient Remains inpatient appropriate because: Severity of illness   Planned Discharge Destination:Home   Diet: Diet Order             Diet NPO time specified Except for: Sips with Meds  Diet effective midnight                     Antimicrobial agents: Anti-infectives (From admission, onward)    Start     Dose/Rate Route Frequency Ordered Stop  09/21/23 2200  azithromycin (ZITHROMAX) tablet 500 mg  Status:  Discontinued        500 mg Oral Daily at bedtime 09/21/23 1325 09/22/23 0932   09/21/23 1200  ceFAZolin (ANCEF) IVPB 2g/100 mL premix        2 g 200 mL/hr over 30 Minutes Intravenous Every M-W-F (Hemodialysis) 09/21/23 0950     09/19/23 2330  cefTRIAXone (ROCEPHIN) 1 g in sodium chloride 0.9 % 100 mL IVPB  Status:  Discontinued        1 g 200  mL/hr over 30 Minutes Intravenous Daily at bedtime 09/19/23 2322 09/21/23 0950   09/19/23 2330  azithromycin (ZITHROMAX) 500 mg in sodium chloride 0.9 % 250 mL IVPB  Status:  Discontinued        500 mg 250 mL/hr over 60 Minutes Intravenous Daily at bedtime 09/19/23 2322 09/21/23 1325        MEDICATIONS: Scheduled Meds:  aspirin  81 mg Oral Daily   bacitracin   Topical BID   Chlorhexidine Gluconate Cloth  6 each Topical Q0600   cinacalcet  30 mg Oral Q M,W,F-HD   doxercalciferol  8 mcg Intravenous Q M,W,F-HD   fluticasone  1 spray Each Nare BID   guaiFENesin  600 mg Oral BID   heparin injection (subcutaneous)  5,000 Units Subcutaneous Q8H   hydrALAZINE  25 mg Oral BID   insulin aspart  0-6 Units Subcutaneous TID WC   ketoconazole   Topical Daily   levETIRAcetam  500 mg Oral Daily   sevelamer carbonate  1,600 mg Oral TID WC   Continuous Infusions:   ceFAZolin (ANCEF) IV Stopped (09/21/23 1653)   PRN Meds:.acetaminophen **OR** acetaminophen, benzonatate, clonazePAM, dextrose, lactulose, melatonin, ondansetron (ZOFRAN) IV, polyethylene glycol   I have personally reviewed following labs and imaging studies  LABORATORY DATA: CBC: Recent Labs  Lab 09/18/23 1929 09/19/23 0458 09/20/23 0554 09/21/23 0506 09/23/23 0501  WBC 10.5 8.7 9.2 9.9 5.5  NEUTROABS 9.3* 7.4 7.6  --   --   HGB 11.4* 10.2* 11.6* 11.0* 10.2*  HCT 37.5* 33.6* 37.2* 37.0* 33.9*  MCV 84.5 85.5 83.4 86.9 84.5  PLT 123* 114* 123* 108* 153    Basic Metabolic Panel: Recent Labs  Lab 09/18/23 1929 09/19/23 0458 09/20/23 0801 09/21/23 0813 09/23/23 0501  NA 137 136 137 136 131*  K 3.9 3.9 4.4 4.6 5.2*  CL 93* 95* 94* 92* 92*  CO2 30 28 31 29 23   GLUCOSE 92 82 84 84 70  BUN 14 17 31* 45* 35*  CREATININE 4.60* 4.99* 7.38* 8.85* 7.90*  CALCIUM 8.4* 7.7* 9.1 9.4 9.5  MG 2.0 2.0  --  2.3  --   PHOS  --  4.5  --   --  4.9*    GFR: Estimated Creatinine Clearance: 8.3 mL/min (A) (by C-G formula based  on SCr of 7.9 mg/dL (H)).  Liver Function Tests: Recent Labs  Lab 09/18/23 1929 09/19/23 0458  AST 23 22  ALT 46* 37  ALKPHOS 95 72  BILITOT 1.0 0.7  PROT 6.4* 5.4*  ALBUMIN 2.4* 2.0*   No results for input(s): "LIPASE", "AMYLASE" in the last 168 hours. Recent Labs  Lab 09/19/23 0459  AMMONIA 15    Coagulation Profile: Recent Labs  Lab 09/18/23 1929  INR 1.2    Cardiac Enzymes: No results for input(s): "CKTOTAL", "CKMB", "CKMBINDEX", "TROPONINI" in the last 168 hours.  BNP (last 3 results) No results for input(s): "PROBNP" in the last 8760 hours.  Lipid Profile: No results for input(s): "CHOL", "HDL", "LDLCALC", "TRIG", "CHOLHDL", "LDLDIRECT" in the last 72 hours.  Thyroid Function Tests: No results for input(s): "TSH", "T4TOTAL", "FREET4", "T3FREE", "THYROIDAB" in the last 72 hours.  Anemia Panel: No results for input(s): "VITAMINB12", "FOLATE", "FERRITIN", "TIBC", "IRON", "RETICCTPCT" in the last 72 hours.  Urine analysis:    Component Value Date/Time   COLORURINE YELLOW 09/16/2020 1256   APPEARANCEUR CLOUDY (A) 09/16/2020 1256   LABSPEC 1.011 09/16/2020 1256   PHURINE 6.0 09/16/2020 1256   GLUCOSEU NEGATIVE 09/16/2020 1256   GLUCOSEU NEGATIVE 07/30/2011 1440   HGBUR SMALL (A) 09/16/2020 1256   BILIRUBINUR NEGATIVE 09/16/2020 1256   KETONESUR NEGATIVE 09/16/2020 1256   PROTEINUR 100 (A) 09/16/2020 1256   UROBILINOGEN 0.2 09/30/2011 1824   NITRITE NEGATIVE 09/16/2020 1256   LEUKOCYTESUR LARGE (A) 09/16/2020 1256    Sepsis Labs: Lactic Acid, Venous    Component Value Date/Time   LATICACIDVEN 1.3 09/18/2023 1917    MICROBIOLOGY: Recent Results (from the past 240 hours)  Resp panel by RT-PCR (RSV, Flu A&B, Covid) Peripheral     Status: Abnormal   Collection Time: 09/18/23  6:57 PM   Specimen: Peripheral; Nasal Swab  Result Value Ref Range Status   SARS Coronavirus 2 by RT PCR NEGATIVE NEGATIVE Final   Influenza A by PCR POSITIVE (A) NEGATIVE  Final   Influenza B by PCR NEGATIVE NEGATIVE Final    Comment: (NOTE) The Xpert Xpress SARS-CoV-2/FLU/RSV plus assay is intended as an aid in the diagnosis of influenza from Nasopharyngeal swab specimens and should not be used as a sole basis for treatment. Nasal washings and aspirates are unacceptable for Xpert Xpress SARS-CoV-2/FLU/RSV testing.  Fact Sheet for Patients: BloggerCourse.com  Fact Sheet for Healthcare Providers: SeriousBroker.it  This test is not yet approved or cleared by the [place] FDA and has been authorized for detection and/or diagnosis of SARS-CoV-2 by FDA under an Emergency Use Authorization (EUA). This EUA will remain in effect (meaning this test can be used) for the duration of the COVID-19 declaration under Section 564(b)(1) of the Act, 21 U.S.C. section 360bbb-3(b)(1), unless the authorization is terminated or revoked.     Resp Syncytial Virus by PCR NEGATIVE NEGATIVE Final    Comment: (NOTE) Fact Sheet for Patients: BloggerCourse.com  Fact Sheet for Healthcare Providers: SeriousBroker.it  This test is not yet approved or cleared by the [place] FDA and has been authorized for detection and/or diagnosis of SARS-CoV-2 by FDA under an Emergency Use Authorization (EUA). This EUA will remain in effect (meaning this test can be used) for the duration of the COVID-19 declaration under Section 564(b)(1) of the Act, 21 U.S.C. section 360bbb-3(b)(1), unless the authorization is terminated or revoked.  Performed at [facility], [address] [place], [place] [address]   Blood Culture (routine x 2)     Status: Abnormal   Collection Time: 09/18/23  7:00 PM   Specimen: BLOOD LEFT ARM  Result Value Ref Range Status   Specimen Description BLOOD LEFT ARM  Final   Special Requests   Final    BOTTLES DRAWN AEROBIC AND ANAEROBIC Blood Culture  results may not be optimal due to an inadequate volume of blood received in culture bottles   Culture  Setup Time   Final    GRAM POSITIVE COCCI IN BOTH AEROBIC AND ANAEROBIC BOTTLES CRITICAL RESULT CALLED TO, READ BACK BY AND VERIFIED WITH: PHARMD [id number] AT 1622 CAREN A., ADC Performed at [facility]  Lab, [address] [place], [place] [address]    Culture STAPHYLOCOCCUS EPIDERMIDIS (A)  Final   Report Status 09/23/2023 FINAL  Final   Organism ID, Bacteria STAPHYLOCOCCUS EPIDERMIDIS  Final      Susceptibility   Staphylococcus epidermidis - MIC*    CIPROFLOXACIN <=0.5 SENSITIVE Sensitive     ERYTHROMYCIN <=0.25 SENSITIVE Sensitive     GENTAMICIN <=0.5 SENSITIVE Sensitive     OXACILLIN <=0.25 SENSITIVE Sensitive     TETRACYCLINE 2 SENSITIVE Sensitive     VANCOMYCIN 2 SENSITIVE Sensitive     TRIMETH/SULFA 20 SENSITIVE Sensitive     CLINDAMYCIN <=0.25 SENSITIVE Sensitive     RIFAMPIN <=0.5 SENSITIVE Sensitive     Inducible Clindamycin NEGATIVE Sensitive     * STAPHYLOCOCCUS EPIDERMIDIS  Blood Culture ID Panel (Reflexed)     Status: Abnormal   Collection Time: 09/18/23  7:00 PM  Result Value Ref Range Status   Enterococcus faecalis NOT DETECTED NOT DETECTED Final   Enterococcus Faecium NOT DETECTED NOT DETECTED Final   Listeria monocytogenes NOT DETECTED NOT DETECTED Final   Staphylococcus species DETECTED (A) NOT DETECTED Final    Comment: CRITICAL RESULT CALLED TO, READ BACK BY AND VERIFIED WITH: PHARMD [id number] AT 1622 CAREN A., ADC    Staphylococcus aureus (BCID) NOT DETECTED NOT DETECTED Final   Staphylococcus epidermidis DETECTED (A) NOT DETECTED Final    Comment: CRITICAL RESULT CALLED TO, READ BACK BY AND VERIFIED WITH: PHARMD [id number] AT 1622 CAREN A., ADC    Staphylococcus lugdunensis NOT DETECTED NOT DETECTED Final   Streptococcus species NOT DETECTED NOT DETECTED Final   Streptococcus agalactiae NOT DETECTED NOT DETECTED Final   Streptococcus pneumoniae NOT DETECTED  NOT DETECTED Final   Streptococcus pyogenes NOT DETECTED NOT DETECTED Final   A.calcoaceticus-baumannii NOT DETECTED NOT DETECTED Final   Bacteroides fragilis NOT DETECTED NOT DETECTED Final   Enterobacterales NOT DETECTED NOT DETECTED Final   Enterobacter cloacae complex NOT DETECTED NOT DETECTED Final   Escherichia coli NOT DETECTED NOT DETECTED Final   Klebsiella aerogenes NOT DETECTED NOT DETECTED Final   Klebsiella oxytoca NOT DETECTED NOT DETECTED Final   Klebsiella pneumoniae NOT DETECTED NOT DETECTED Final   Proteus species NOT DETECTED NOT DETECTED Final   Salmonella species NOT DETECTED NOT DETECTED Final   Serratia marcescens NOT DETECTED NOT DETECTED Final   Haemophilus influenzae NOT DETECTED NOT DETECTED Final   Neisseria meningitidis NOT DETECTED NOT DETECTED Final   Pseudomonas aeruginosa NOT DETECTED NOT DETECTED Final   Stenotrophomonas maltophilia NOT DETECTED NOT DETECTED Final   Candida albicans NOT DETECTED NOT DETECTED Final   Candida auris NOT DETECTED NOT DETECTED Final   Candida glabrata NOT DETECTED NOT DETECTED Final   Candida krusei NOT DETECTED NOT DETECTED Final   Candida parapsilosis NOT DETECTED NOT DETECTED Final   Candida tropicalis NOT DETECTED NOT DETECTED Final   Cryptococcus neoformans/gattii NOT DETECTED NOT DETECTED Final   Methicillin resistance mecA/C NOT DETECTED NOT DETECTED Final    Comment: Performed at [facility], [address], [place], [place] [address]  Blood Culture (routine x 2)     Status: Abnormal   Collection Time: 09/18/23  7:29 PM   Specimen: BLOOD LEFT HAND  Result Value Ref Range Status   Specimen Description BLOOD LEFT HAND  Final   Special Requests   Final    BOTTLES DRAWN AEROBIC ONLY Blood Culture results may not be optimal due to an inadequate volume of blood received in culture bottles  Culture  Setup Time   Final    GRAM POSITIVE COCCI AEROBIC BOTTLE ONLY CRITICAL VALUE NOTED.  VALUE IS CONSISTENT WITH  PREVIOUSLY REPORTED AND CALLED VALUE. Performed at [facility], [address], [place], [place] [address]    Culture STAPHYLOCOCCUS EPIDERMIDIS (A)  Final   Report Status 09/22/2023 FINAL  Final   Organism ID, Bacteria STAPHYLOCOCCUS EPIDERMIDIS  Final      Susceptibility   Staphylococcus epidermidis - MIC*    CIPROFLOXACIN <=0.5 SENSITIVE Sensitive     ERYTHROMYCIN <=0.25 SENSITIVE Sensitive     GENTAMICIN <=0.5 SENSITIVE Sensitive     OXACILLIN <=0.25 SENSITIVE Sensitive     TETRACYCLINE 2 SENSITIVE Sensitive     VANCOMYCIN 2 SENSITIVE Sensitive     TRIMETH/SULFA 20 SENSITIVE Sensitive     CLINDAMYCIN <=0.25 SENSITIVE Sensitive     RIFAMPIN <=0.5 SENSITIVE Sensitive     Inducible Clindamycin NEGATIVE Sensitive     * STAPHYLOCOCCUS EPIDERMIDIS  Culture, blood (single) w Reflex to ID Panel     Status: None (Preliminary result)   Collection Time: 09/21/23  8:13 AM   Specimen: BLOOD LEFT ARM  Result Value Ref Range Status   Specimen Description BLOOD LEFT ARM  Final   Special Requests   Final    BOTTLES DRAWN AEROBIC AND ANAEROBIC Blood Culture adequate volume   Culture   Final    NO GROWTH 2 DAYS Performed at [facility], [address], [place], [place] [address]    Report Status PENDING  Incomplete    RADIOLOGY STUDIES/RESULTS: No results found.   LOS: 5 days   Jeoffrey Massed, MD  [facility]    To contact the attending provider between 7A-7P or the covering provider during after hours 7P-7A, please log into the web site [email or web address] and access using universal [place] password for that web site. If you do not have the password, please call the hospital operator.  09/23/2023, 11:47 AM

## 2023-09-24 ENCOUNTER — Other Ambulatory Visit (HOSPITAL_COMMUNITY): Payer: Self-pay

## 2023-09-24 DIAGNOSIS — J9601 Acute respiratory failure with hypoxia: Secondary | ICD-10-CM | POA: Diagnosis not present

## 2023-09-24 DIAGNOSIS — G9341 Metabolic encephalopathy: Secondary | ICD-10-CM | POA: Diagnosis not present

## 2023-09-24 DIAGNOSIS — I5032 Chronic diastolic (congestive) heart failure: Secondary | ICD-10-CM | POA: Diagnosis not present

## 2023-09-24 DIAGNOSIS — J101 Influenza due to other identified influenza virus with other respiratory manifestations: Secondary | ICD-10-CM | POA: Diagnosis not present

## 2023-09-24 LAB — RENAL FUNCTION PANEL
Albumin: 2.2 g/dL — ABNORMAL LOW (ref 3.5–5.0)
Anion gap: 14 (ref 5–15)
BUN: 21 mg/dL (ref 8–23)
CO2: 27 mmol/L (ref 22–32)
Calcium: 9.4 mg/dL (ref 8.9–10.3)
Chloride: 94 mmol/L — ABNORMAL LOW (ref 98–111)
Creatinine, Ser: 5.96 mg/dL — ABNORMAL HIGH (ref 0.61–1.24)
GFR, Estimated: 10 mL/min — ABNORMAL LOW (ref >60)
Glucose, Bld: 73 mg/dL (ref 70–99)
Phosphorus: 4.4 mg/dL (ref 2.5–4.6)
Potassium: 4.5 mmol/L (ref 3.5–5.1)
Sodium: 135 mmol/L (ref 135–145)

## 2023-09-24 LAB — CBC
HCT: 33.6 % — ABNORMAL LOW (ref 39.0–52.0)
Hemoglobin: 10.2 g/dL — ABNORMAL LOW (ref 13.0–17.0)
MCH: 25.6 pg — ABNORMAL LOW (ref 26.0–34.0)
MCHC: 30.4 g/dL (ref 30.0–36.0)
MCV: 84.4 fL (ref 80.0–100.0)
Platelets: 171 10*3/uL (ref 150–400)
RBC: 3.98 MIL/uL — ABNORMAL LOW (ref 4.22–5.81)
RDW: 18.5 % — ABNORMAL HIGH (ref 11.5–15.5)
WBC: 5 10*3/uL (ref 4.0–10.5)
nRBC: 0 % (ref 0.0–0.2)

## 2023-09-24 LAB — GLUCOSE, CAPILLARY: Glucose-Capillary: 103 mg/dL — ABNORMAL HIGH (ref 70–99)

## 2023-09-24 MED ORDER — LEVETIRACETAM 500 MG PO TABS
500.0000 mg | ORAL_TABLET | Freq: Every day | ORAL | 0 refills | Status: DC
  Filled 2023-09-24: qty 30, 30d supply, fill #0

## 2023-09-24 MED ORDER — CEFAZOLIN SODIUM-DEXTROSE 2-4 GM/100ML-% IV SOLN: 2.0000 g | INTRAVENOUS | Status: AC

## 2023-09-24 MED ORDER — HYDRALAZINE HCL 25 MG PO TABS
25.0000 mg | ORAL_TABLET | Freq: Two times a day (BID) | ORAL | 0 refills | Status: DC
  Filled 2023-09-24: qty 30, 15d supply, fill #0

## 2023-09-24 NOTE — Progress Notes (Signed)
PHARMACY CONSULT NOTE FOR:  OUTPATIENT  PARENTERAL ANTIBIOTIC THERAPY (OPAT)  Informational only as the patient will receive antibiotics with hemodialysis outpatient  Indication:  Methicillin-susceptible Staph epidermidis bacteremia, endocarditis (small 2 mm hypermobile echodensity on ventricular surface of anterior leaflet, possible vegetation) Regimen: Cefazolin 2g/HD-MWF End date: 11/02/23 (6 weeks from neg blood culture 2/10)  IV antibiotic discharge orders are completed. To discharging provider:  please sign these orders via discharge navigator,  Select New Orders & click on the button choice - Manage This Unsigned Work.    Thank you for allowing pharmacy to be a part of this patient's care.  Rutherford Nail, PharmD PGY2 Critical Care Pharmacy Resident 09/24/2023 12:24 PM   **Pharmacist phone directory can now be found on [email or web address] ([email or web address]).  Listed under [facility].

## 2023-09-24 NOTE — Progress Notes (Signed)
Heart Failure Navigator Progress Note  Assessed for Heart & Vascular TOC clinic readiness.  Patient does not meet criteria due to ESRD on hemodialysis.   Navigator will sign off at this time.   Rhae Hammock, BSN, Scientist, clinical (histocompatibility and immunogenetics) Only

## 2023-09-24 NOTE — Progress Notes (Addendum)
Patient is sitting on the chair, alert, awake, complained for headache . PT was given 560 mg PO Tylenol.  LFA PIV got infiltrated. Removed IV, replaced PIV LFA, dressing is dry, intact.

## 2023-09-24 NOTE — Plan of Care (Signed)
  Problem: Education: Goal: Ability to describe self-care measures that may prevent or decrease complications (Diabetes Survival Skills Education) will improve Outcome: Progressing   Problem: Education: Goal: Ability to describe self-care measures that may prevent or decrease complications (Diabetes Survival Skills Education) will improve Outcome: Progressing   Problem: Coping: Goal: Ability to adjust to condition or change in health will improve Outcome: Progressing

## 2023-09-24 NOTE — Progress Notes (Signed)
Occupational Therapy Treatment Patient Details Name: [name] MRN: [id number] DOB: 10-21-1950 Today's Date: 09/24/2023   History of present illness [name] is a 73 yo male presenting to [facility] with reports of fever with chills. Pt was admitted to [facility] on 09/18/2023 with Influenza A infection. PMHx includes ESRD on HD, HTN, PAD, HLD. Recent admission due to 11 days of missed HD.   OT comments  Pt is making good progress towards acute OT goals. Session focused on improving activity tolerance with functional tasks. Pt completed STS from EOB to RW with CGA. Pt educated on B hand placement to increase pt independence and safety. Pt verbalized understanding. Pt engaged in functional ambulation throughout hallway using RW with up to CGA. Pt required MIN verbal cues for RW management around obstacles throughout pt room and hallway to reduce risk of falls. Pt demonstrated improved activity tolerance on this date, completing ~10 minutes of standing functional activity without requiring a rest break. Recommends pt discharge home with family support and follow-up HHOT to maximize functional independence.      If plan is discharge home, recommend the following:  A little help with walking and/or transfers;A little help with bathing/dressing/bathroom;Direct supervision/assist for medications management;Direct supervision/assist for financial management;Help with stairs or ramp for entrance;Assist for transportation;Assistance with cooking/housework   Equipment Recommendations  None recommended by OT    Recommendations for Other Services      Precautions / Restrictions Precautions Precautions: Fall;Other (comment) (droplet) Precaution/Restrictions Comments: Droplet Restrictions Weight Bearing Restrictions Per Provider Order: No       Mobility Bed Mobility Overal bed mobility: Needs Assistance             General bed mobility comments: Pt seated EOB upon arrival     Transfers Overall transfer level: Needs assistance Equipment used: Rolling walker (2 wheels) Transfers: Sit to/from Stand Sit to Stand: Contact guard assist           General transfer comment: Pt required verbal/tactile cues for B hand placement when performing STS     Balance Overall balance assessment: Needs assistance Sitting-balance support: No upper extremity supported, Feet supported Sitting balance-Leahy Scale: Good     Standing balance support: During functional activity, Bilateral upper extremity supported Standing balance-Leahy Scale: Fair                             ADL either performed or assessed with clinical judgement   ADL Overall ADL's : Needs assistance/impaired                                     Functional mobility during ADLs: Contact guard assist;Rolling walker (2 wheels) General ADL Comments: Pt completed all functional mobility tasks with CGA. Pt required verbal cues for safe management of RW when negotiating obstacles throughout room and hallway    Extremity/Trunk Assessment Upper Extremity Assessment Upper Extremity Assessment: Overall WFL for tasks assessed   Lower Extremity Assessment Lower Extremity Assessment: Defer to PT evaluation        Vision   Vision Assessment?: No apparent visual deficits   Perception Perception Perception: Not tested   Praxis Praxis Praxis: Not tested   Communication Communication Communication: No apparent difficulties   Cognition Arousal: Alert Behavior During Therapy: WFL for tasks assessed/performed Cognition: No apparent impairments  OT - Cognition Comments: Pt answered questions appropriately, followed verbal commands throughout therapy session. Agreeable to OT without encouragement on this date and motivated to continued onced discharged home.                 Following commands: Intact        Cueing   Cueing Techniques: Verbal cues   Exercises      Shoulder Instructions       General Comments VSS on room air    Pertinent Vitals/ Pain       Pain Assessment Pain Location: Pt reported that he has pain all over, everyday Pain Descriptors / Indicators: Other (Comment) (No expression or indications of pain throughout therapy session) Pain Intervention(s): Monitored during session  Home Living                                          Prior Functioning/Environment              Frequency  Min 1X/week        Progress Toward Goals  OT Goals(current goals can now be found in the care plan section)  Progress towards OT goals: Progressing toward goals  Acute Rehab OT Goals Patient Stated Goal: to go home OT Goal Formulation: With patient Time For Goal Achievement: 10/05/23 Potential to Achieve Goals: Good ADL Goals Pt Will Perform Grooming: with modified independence;standing Pt Will Transfer to Toilet: with modified independence;ambulating;regular height toilet Pt Will Perform Toileting - Clothing Manipulation and hygiene: with modified independence;sitting/lateral leans;sit to/from stand Pt/caregiver will Perform Home Exercise Program: Increased strength;Both right and left upper extremity;With theraband;With Supervision;With written HEP provided Additional ADL Goal #1: Pt will demonstrate improved activity tolerance while participating in OOB functional task in standing for 10 minutes without demonstrating signs of increased fatigue.  Plan      Co-evaluation          OT goals addressed during session: Other (comment);Proper use of Adaptive equipment and DME (activity tolerance)      AM-PAC OT "6 Clicks" Daily Activity     Outcome Measure   Help from another person eating meals?: None Help from another person taking care of personal grooming?: A Little Help from another person toileting, which includes using toliet, bedpan, or urinal?: A Little Help from another person  bathing (including washing, rinsing, drying)?: A Little Help from another person to put on and taking off regular upper body clothing?: A Little Help from another person to put on and taking off regular lower body clothing?: A Little 6 Click Score: 19    End of Session Equipment Utilized During Treatment: Gait belt;Rolling walker (2 wheels)  OT Visit Diagnosis: Muscle weakness (generalized) (M62.81);Unsteadiness on feet (R26.81)   Activity Tolerance Patient tolerated treatment well   Patient Left in bed;with call bell/phone within reach;with bed alarm set;with family/visitor present   Nurse Communication Mobility status        Time: 5643-3295 OT Time Calculation (min): 14 min  Charges: OT General Charges $OT Visit: 1 Visit OT Treatments $Therapeutic Activity: 8-22 mins  [address], MOTS  Amaziah Ghosh 09/24/2023, 10:20 AM

## 2023-09-24 NOTE — Progress Notes (Signed)
[facility] Progress Note   Subjective:    Seen and examined patient at bedside. Denies any acute issues. Patient will be discharging today.  Objective Vitals:   09/24/23 0300 09/24/23 0400 09/24/23 0500 09/24/23 0800  BP:  133/64  (!) 117/55  Pulse: 75 77  79  Resp: 20 10  20   Temp:  98.2 F (36.8 C)  97.7 F (36.5 C)  TempSrc:  Oral  Oral  SpO2: 90% 94%  93%  Weight:   72.6 kg    Physical Exam General: Awake, alert, on RA, NAD Heart: S1 and S2; No murmurs, gallops, or rubs Lungs: Clear anteriorly; No wheezing, rales, or rhonchi Abdomen: Soft and non-tender Extremities: No bilateral LE edema Dialysis Access: R AVG (+) B/T   Filed Weights   09/21/23 1355 09/23/23 0500 09/24/23 0500  Weight: 69.5 kg 67.1 kg 72.6 kg    Intake/Output Summary (Last 24 hours) at 09/24/2023 1307 Last data filed at 09/23/2023 1828 Gross per 24 hour  Intake 250 ml  Output 1000 ml  Net -750 ml    Additional Objective Labs: Basic Metabolic Panel: Recent Labs  Lab 09/19/23 0458 09/20/23 0801 09/21/23 0813 09/23/23 0501 09/24/23 0454  NA 136   < > 136 131* 135  K 3.9   < > 4.6 5.2* 4.5  CL 95*   < > 92* 92* 94*  CO2 28   < > 29 23 27   GLUCOSE 82   < > 84 70 73  BUN 17   < > 45* 35* 21  CREATININE 4.99*   < > 8.85* 7.90* 5.96*  CALCIUM 7.7*   < > 9.4 9.5 9.4  PHOS 4.5  --   --  4.9* 4.4   < > = values in this interval not displayed.   Liver Function Tests: Recent Labs  Lab 09/18/23 1929 09/19/23 0458 09/24/23 0454  AST 23 22  --   ALT 46* 37  --   ALKPHOS 95 72  --   BILITOT 1.0 0.7  --   PROT 6.4* 5.4*  --   ALBUMIN 2.4* 2.0* 2.2*   No results for input(s): "LIPASE", "AMYLASE" in the last 168 hours. CBC: Recent Labs  Lab 09/18/23 1929 09/19/23 0458 09/20/23 0554 09/21/23 0506 09/23/23 0501 09/24/23 0454  WBC 10.5 8.7 9.2 9.9 5.5 5.0  NEUTROABS 9.3* 7.4 7.6  --   --   --   HGB 11.4* 10.2* 11.6* 11.0* 10.2* 10.2*  HCT 37.5* 33.6* 37.2* 37.0*  33.9* 33.6*  MCV 84.5 85.5 83.4 86.9 84.5 84.4  PLT 123* 114* 123* 108* 153 171   Blood Culture    Component Value Date/Time   SDES BLOOD LEFT ARM 09/21/2023 0813   SPECREQUEST  09/21/2023 0813    BOTTLES DRAWN AEROBIC AND ANAEROBIC Blood Culture adequate volume   CULT  09/21/2023 0813    NO GROWTH 3 DAYS Performed at [facility], [address], [place], [place] [address]    REPTSTATUS PENDING 09/21/2023 [phone]    Cardiac Enzymes: No results for input(s): "CKTOTAL", "CKMB", "CKMBINDEX", "TROPONINI" in the last 168 hours. CBG: Recent Labs  Lab 09/23/23 1402 09/23/23 1430 09/23/23 1532 09/23/23 2202 09/24/23 0912  GLUCAP 62* 95 91 97 103*   Iron Studies: No results for input(s): "IRON", "TIBC", "TRANSFERRIN", "FERRITIN" in the last 72 hours. Lab Results  Component Value Date   INR 1.2 09/18/2023   INR 1.4 (H) 09/07/2023   INR 1.08 06/28/2014   Studies/Results:  ECHO TEE Result Date: 09/23/2023    TRANSESOPHOGEAL ECHO REPORT   Patient Name:   KEALII THUESON Date of Exam: 09/23/2023 Medical Rec #:  [id number]         Height:       71.0 in Accession #:    [id number]        Weight:       147.9 lb Date of Birth:  03-06-1951         BSA:          1.855 m Patient Age:    72 years          BP:           179/93 mmHg Patient Gender: M                 HR:           81 bpm. Exam Location:  Inpatient Procedure: Transesophageal Echo, 3D Echo, Color Doppler and Cardiac Doppler            (Both Spectral and Color Flow Doppler were utilized during            procedure). Indications:     Bacteremia  History:         Patient has prior history of Echocardiogram examinations.  Sonographer:     Irving Burton Senior RDCS Referring Phys:  [id number] Jonita Albee Diagnosing Phys: Riley Lam MD PROCEDURE: After discussion of the risks and benefits of a TEE, an informed consent was obtained from the patient. The transesophogeal probe was passed without difficulty through the esophogus of the  patient. Sedation performed by different physician. The patient was monitored while under deep sedation. Anesthestetic sedation was provided intravenously by Anesthesiology: 214mg  of Propofol, 60mg  of Lidocaine. The patient developed no complications during the procedure.  IMPRESSIONS  1. Left ventricular ejection fraction, by estimation, is 40 to 45%. Left ventricular ejection fraction by 3D volume is 44 %. The left ventricle has mildly decreased function.  2. Normal RV Free wall strain. Right ventricular systolic function is normal. The right ventricular size is mildly enlarged.  3. Left atrial size was severely dilated. No left atrial/left atrial appendage thrombus was detected. The LAA emptying velocity was 55 cm/s.  4. Right atrial size was severely dilated.  5. Moderate pericardial effusion. The pericardial effusion is posterior to the left ventricle.  6. Many degnerative changes on the mitral valve. There is a small (2 mm) hypermobile echodensity on the ventricular surface of the anterior leaflet. Cannot exclude small vegetation . The mitral valve is degenerative. Mild mitral valve regurgitation. Moderate mitral annular calcification.  7. Functional regurgitation. Anterior-septal coaptation gap 3 mm, AP coaptation gap 4 mm. Tricuspid valve regurgitation is severe.  8. The aortic valve is tricuspid. Aortic valve regurgitation is mild. No aortic stenosis is present.  9. The inferior vena cava is dilated in size with <50% respiratory variability, suggesting right atrial pressure of 15 mmHg. 10. 3D performed of the tricuspid valve and demonstrates Severe tricuspid regurgitation that is anterior-septal in nature. This is atrial functional in nature. 3D VCA 0.599 cm2. FINDINGS  Left Ventricle: Left ventricular ejection fraction, by estimation, is 40 to 45%. Left ventricular ejection fraction by 3D volume is 44 %. The left ventricle has mildly decreased function. The left ventricular internal cavity size was normal  in size. Right Ventricle: Normal RV Free wall strain. The right ventricular size is mildly enlarged. No increase in right ventricular wall thickness. Right ventricular systolic  function is normal. Left Atrium: Left atrial size was severely dilated. No left atrial/left atrial appendage thrombus was detected. The LAA emptying velocity was 55 cm/s. Right Atrium: Right atrial size was severely dilated. Pericardium: A moderately sized pericardial effusion is present. The pericardial effusion is posterior to the left ventricle. Mitral Valve: Many degnerative changes on the mitral valve. There is a small (2 mm) hypermobile echodensity on the ventricular surface of the anterior leaflet. Cannot exclude small vegetation. The mitral valve is degenerative in appearance. Moderate mitral annular calcification. Mild mitral valve regurgitation. Tricuspid Valve: Functional regurgitation. Anterior-septal coaptation gap 3 mm, AP coaptation gap 4 mm. The tricuspid valve is normal in structure. Tricuspid valve regurgitation is severe. No evidence of tricuspid stenosis. The flow in the hepatic veins is reversed during ventricular systole. Aortic Valve: The aortic valve is tricuspid. Aortic valve regurgitation is mild. No aortic stenosis is present. Pulmonic Valve: The pulmonic valve was normal in structure. Pulmonic valve regurgitation is trivial. No evidence of pulmonic stenosis. Aorta: The aortic root, ascending aorta, aortic arch and descending aorta are all structurally normal, with no evidence of dilitation or obstruction. Venous: A systolic blunting flow pattern is recorded from the left upper pulmonary vein. The inferior vena cava is dilated in size with less than 50% respiratory variability, suggesting right atrial pressure of 15 mmHg. The inferior vena cava and the hepatic vein show a pattern of systolic flow reversal, suggestive of tricuspid regurgitation. IAS/Shunts: There is left bowing of the interatrial septum, suggestive  of elevated right atrial pressure. The atrial septum is grossly normal. Additional Comments: 3D was performed not requiring image post processing on an independent workstation and was abnormal. Spectral Doppler performed.  3D Volume EF LV 3D EF:    Left ventricular ejection fraction by 3D volume is 44              %. LV 3D EDV:   117.24 ml LV 3D ESV:   65.83 ml  3D Volume EF: 3D EF:        44 %  AORTA Ao Asc diam: 3.80 cm TRICUSPID VALVE TR Peak grad:   28.3 mmHg TR Vmax:        266.00 cm/s Riley Lam MD Electronically signed by Riley Lam MD Signature Date/Time: 09/23/2023/6:41:34 PM    Final    EP STUDY Result Date: 09/23/2023 See surgical note for result.   Medications:   ceFAZolin (ANCEF) IV Stopped (09/21/23 1653)    amLODipine  10 mg Oral QHS   aspirin  81 mg Oral Daily   bacitracin   Topical BID   carvedilol  25 mg Oral BID WC   Chlorhexidine Gluconate Cloth  6 each Topical Q0600   cinacalcet  30 mg Oral Q M,W,F-HD   doxercalciferol  8 mcg Intravenous Q M,W,F-HD   fluticasone  1 spray Each Nare BID   guaiFENesin  600 mg Oral BID   heparin injection (subcutaneous)  5,000 Units Subcutaneous Q8H   hydrALAZINE  25 mg Oral BID   insulin aspart  0-6 Units Subcutaneous TID WC   levETIRAcetam  500 mg Oral Daily   sevelamer carbonate  1,600 mg Oral TID WC    Dialysis Orders:  MWF G-O 4h  B400   71kg   2/2 bath  R AVG  Heparin 4000 - last HD 2/07, post wt 70.8 - step-wise lowering of dry wt 3-4kg over last 3 wks - gets to dry wt - venofer 100 qhd thru 2/19 - hectorol  8 mcg  - sensipar 30 mg three times per week - last Hb 11.1, last tsat 21, last pth 526, phos 7.3  Renal-related home meds: - norvasc 10 - coreg 25 bid - renvela 1600 ac tid - hydralazine 25 bid - losartan 25 bid  Assessment/Plan: Sepsis due to flu A infection -. Noted recent admit for influenza pneumonia. Patient was then re-admitted due to new onset fever and fatigue. Found to have staph epi  bacteremia. ID now following. Blood cultures from 2/10 NGTD X 3 days. 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 Per ID, patient will receive Cefazolin 2GM IV with HD with end date 11/02/23. ESRD - on HD MWF. Has not missed HD. Next HD 2/14 in outpatient. HTN - BP's stable, getting home losartan/ norvasc here.  Volume - no vol excess on exam. CXR not suggestive of edema either. Appears he's now under his EDW. Will continue minimal UF with HD tomorrow. Lower EDW at discharge. 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 Hold IV Fe load d/t infection.  Secondary hyperparathyroidism - CCa and phos are in range. Cont IV vdra and sensipar three times per week.  Dispo - Okay for discharge from a renal standpoint. See above for detail on outpatient ABX plan.  Salome Holmes, NP [facility] 09/24/2023,1:07 PM  LOS: 6 days

## 2023-09-24 NOTE — Discharge Summary (Signed)
PATIENT DETAILS Name: [name] Age: 73 y.o. Sex: male Date of Birth: [birth year] MRN: [id number]. Admitting Physician: Angie Fava, DO OZH:[id number], Bernadene Bell, MD  Admit Date: 09/18/2023 Discharge date: 09/24/2023  Recommendations for Outpatient Follow-up:  Follow up with PCP in 1-2 weeks Please obtain CMP/CBC in one week IV Ancef-end date 3/24.   Ensure follow-up with infectious disease clinic.  Admitted From:  Home  Disposition: Home health   Discharge Condition: good  CODE STATUS:   Code Status: Full Code   Diet recommendation:  Diet Order             Diet - low sodium heart healthy           Diet regular Room service appropriate? Yes; Fluid consistency: Thin  Diet effective now                    Brief Summary: Patient is a 73 y.o.  male with history of ESRD, DM-2, HTN-who was recently diagnosed with influenza and treated with Tamiflu-presented with fever/lethargy-found to have Staph epidermidis bacteremia.   Significant events: 1/27-2/2>> possible cessation-altered mental status-influenza 2/7>> fever- admit to [facility].   Significant studies: 2/7>> CXR: Negative for PNA 2/7>> CT head: No acute intracranial process 2/8>> echo: EF 45-50% 2/12>> TEE: Small hypermobile echodensity on the anterior leaflet of the mitral valve.   Significant microbiology data: 2/7>> influenza A positive 2/7>> COVID/RSV: Negative 2/7>> blood culture: Staph epidermidis 2/10>> blood culture: No growth   Procedures: None   Consults: ID Renal  Brief Hospital Course: Sepsis secondary to Staph epidermidis bacteremia with native mitral valve endocarditis. Sepsis physiology has resolved Repeat cultures negative TTE negative for vegetation TEE positive for probable mitral valve vegetation ID recommending IV Ancef until 3/20-this will be given with HD (renal MD aware)   Acute hypoxic respiratory failure secondary to influenza A infection Hypoxia resolved-on room  air Recently completed Tamiflu-prior to this hospitalization.   Acute metabolic encephalopathy Likely secondary to sepsis/bacteremia Resolved-completely awake and alert   ESRD on HD [facility] Nephrology followed-discussed with nephrologist-Dr. Arlean Hopping on 2/13-he will arrange for antibiotics with hemodialysis   Normocytic anemia Hb stable Aranesp/iron defer to nephrology service.   Thrombocytopenia Likely due to influenza Resolved.   Seizure disorder Keppra   Chronic combined systolic/diastolic heart failure Euvolemic Volume removal with hemodialysis   HTN BP better controlled today Continue hydralazine/amlodipine/Coreg on discharge.    Debility/deconditioning PT/OT recommended.   BMI: Estimated body mass index is 20.63 kg/m as calculated from the following:   Height as of 09/15/23: 5\' 11"  (1.803 m).   Weight as of this encounter: 67.1 kg.   Discharge Diagnoses:  Principal Problem:   Influenza A Active Problems:   Essential hypertension   ESRD on dialysis (HCC)   History of anemia due to chronic kidney disease   Chronic diastolic CHF (congestive heart failure) (HCC)   Acute respiratory failure with hypoxia (HCC)   DM2 (diabetes mellitus, type 2) (HCC)   Severe sepsis (HCC)   Acute metabolic encephalopathy   Hypoglycemia   History of seizures   Discharge Instructions:  Activity:  As tolerated with Full fall precautions use walker/cane & assistance as needed  Discharge Instructions     Call MD for:  difficulty breathing, headache or visual disturbances   Complete by: As directed    Diet - low sodium heart healthy   Complete by: As directed    Discharge instructions   Complete by: As directed  Follow with Primary MD  Etta Grandchild, MD in 1-2 weeks  Follow-up with infectious disease clinic-their office will call you with appointment  Follow-up with your hemodialysis center as previously scheduled.  Please get a complete blood count and chemistry panel  checked by your Primary MD at your next visit, and again as instructed by your Primary MD.  Get Medicines reviewed and adjusted: Please take all your medications with you for your next visit with your Primary MD  Laboratory/radiological data: Please request your Primary MD to go over all hospital tests and procedure/radiological results at the follow up, please ask your Primary MD to get all Hospital records sent to his/her office.  In some cases, they will be blood work, cultures and biopsy results pending at the time of your discharge. Please request that your primary care M.D. follows up on these results.  Also Note the following: If you experience worsening of your admission symptoms, develop shortness of breath, life threatening emergency, suicidal or homicidal thoughts you must seek medical attention immediately by calling 911 or calling your MD immediately  if symptoms less severe.  You must read complete instructions/literature along with all the possible adverse reactions/side effects for all the Medicines you take and that have been prescribed to you. Take any new Medicines after you have completely understood and accpet all the possible adverse reactions/side effects.   Do not drive when taking Pain medications or sleeping medications (Benzodaizepines)  Do not take more than prescribed Pain, Sleep and Anxiety Medications. It is not advisable to combine anxiety,sleep and pain medications without talking with your primary care practitioner  Special Instructions: If you have smoked or chewed Tobacco  in the last 2 yrs please stop smoking, stop any regular Alcohol  and or any Recreational drug use.  Wear Seat belts while driving.  Please note: You were cared for by a hospitalist during your hospital stay. Once you are discharged, your primary care physician will handle any further medical issues. Please note that NO REFILLS for any discharge medications will be authorized once you are  discharged, as it is imperative that you return to your primary care physician (or establish a relationship with a primary care physician if you do not have one) for your post hospital discharge needs so that they can reassess your need for medications and monitor your lab values.   Increase activity slowly   Complete by: As directed       Allergies as of 09/24/2023   No Known Allergies      Medication List     STOP taking these medications    losartan 25 MG tablet Commonly known as: COZAAR   oseltamivir 30 MG capsule Commonly known as: TAMIFLU       TAKE these medications    acetaminophen 500 MG tablet Commonly known as: TYLENOL Take 1,000 mg by mouth every 8 (eight) hours as needed for moderate pain.   amLODipine 10 MG tablet Commonly known as: NORVASC Take 10 mg by mouth at bedtime.   aspirin 81 MG chewable tablet Chew 1 tablet (81 mg total) by mouth daily.   calcitRIOL 0.5 MCG capsule Commonly known as: ROCALTROL Take 0.5 mcg by mouth See admin instructions. Take one tablet by mouth three times a week on Monday, Wednesday and Fridays per wife   carvedilol 25 MG tablet Commonly known as: COREG Take 25 mg by mouth 2 (two) times daily with a meal.   ceFAZolin 2-4 GM/100ML-% IVPB Commonly known as:  ANCEF Inject 100 mLs (2 g total) into the vein every Monday, Wednesday, and Friday with hemodialysis. Start taking on: September 25, 2023   cinacalcet 30 MG tablet Commonly known as: SENSIPAR Take 1 tablet (30 mg total) by mouth every Monday, Wednesday, and Friday.   clonazepam 2 MG disintegrating tablet Commonly known as: KLONOPIN Take 1 tablet (2 mg total) by mouth daily.   guaiFENesin 600 MG 12 hr tablet Commonly known as: MUCINEX Take 1 tablet (600 mg total) by mouth 2 (two) times daily.   hydrALAZINE 25 MG tablet Commonly known as: APRESOLINE Take 1 tablet (25 mg total) by mouth 2 (two) times daily.   levETIRAcetam 500 MG tablet Commonly known as:  Keppra Take 1 tablet (500 mg total) by mouth daily.   senna-docusate 8.6-50 MG tablet Commonly known as: Senokot-S Take 1 tablet by mouth 2 (two) times daily.   sevelamer carbonate 800 MG tablet Commonly known as: RENVELA Take 1,600 mg by mouth 3 (three) times daily with meals.   tiZANidine 2 MG tablet Commonly known as: ZANAFLEX Take 2 mg by mouth every 8 (eight) hours as needed for muscle spasms.        Follow-up Information     Etta Grandchild, MD. Schedule an appointment as soon as possible for a visit in 1 week(s).   Specialty: Internal Medicine Contact information: [address] [place] [address] [phone]         Judyann Munson, MD Follow up on 10/30/2023.   Specialty: Infectious Diseases Why: appointment at 9 am Contact information: [address] [place] [address] [phone]                No Known Allergies   Other Procedures/Studies: ECHO TEE Result Date: 09/23/2023    TRANSESOPHOGEAL ECHO REPORT   Patient Name:   [name] Date of Exam: 09/23/2023 Medical Rec #:  [id number]         Height:       71.0 in Accession #:    [id number]        Weight:       147.9 lb Date of Birth:  Aug 20, 1950         BSA:          1.855 m Patient Age:    72 years          BP:           179/93 mmHg Patient Gender: M                 HR:           81 bpm. Exam Location:  Inpatient Procedure: Transesophageal Echo, 3D Echo, Color Doppler and Cardiac Doppler            (Both Spectral and Color Flow Doppler were utilized during            procedure). Indications:     Bacteremia  History:         Patient has prior history of Echocardiogram examinations.  Sonographer:     Irving Burton Senior RDCS Referring Phys:  [id number] Jonita Albee Diagnosing Phys: Riley Lam MD PROCEDURE: After discussion of the risks and benefits of a TEE, an informed consent was obtained from the patient. The transesophogeal probe was passed without difficulty through  the esophogus of the patient. Sedation performed by different physician. The patient was monitored while under deep sedation. Anesthestetic sedation was provided intravenously by Anesthesiology: 214mg  of  Propofol, 60mg  of Lidocaine. The patient developed no complications during the procedure.  IMPRESSIONS  1. Left ventricular ejection fraction, by estimation, is 40 to 45%. Left ventricular ejection fraction by 3D volume is 44 %. The left ventricle has mildly decreased function.  2. Normal RV Free wall strain. Right ventricular systolic function is normal. The right ventricular size is mildly enlarged.  3. Left atrial size was severely dilated. No left atrial/left atrial appendage thrombus was detected. The LAA emptying velocity was 55 cm/s.  4. Right atrial size was severely dilated.  5. Moderate pericardial effusion. The pericardial effusion is posterior to the left ventricle.  6. Many degnerative changes on the mitral valve. There is a small (2 mm) hypermobile echodensity on the ventricular surface of the anterior leaflet. Cannot exclude small vegetation . The mitral valve is degenerative. Mild mitral valve regurgitation. Moderate mitral annular calcification.  7. Functional regurgitation. Anterior-septal coaptation gap 3 mm, AP coaptation gap 4 mm. Tricuspid valve regurgitation is severe.  8. The aortic valve is tricuspid. Aortic valve regurgitation is mild. No aortic stenosis is present.  9. The inferior vena cava is dilated in size with <50% respiratory variability, suggesting right atrial pressure of 15 mmHg. 10. 3D performed of the tricuspid valve and demonstrates Severe tricuspid regurgitation that is anterior-septal in nature. This is atrial functional in nature. 3D VCA 0.599 cm2. FINDINGS  Left Ventricle: Left ventricular ejection fraction, by estimation, is 40 to 45%. Left ventricular ejection fraction by 3D volume is 44 %. The left ventricle has mildly decreased function. The left ventricular internal  cavity size was normal in size. Right Ventricle: Normal RV Free wall strain. The right ventricular size is mildly enlarged. No increase in right ventricular wall thickness. Right ventricular systolic function is normal. Left Atrium: Left atrial size was severely dilated. No left atrial/left atrial appendage thrombus was detected. The LAA emptying velocity was 55 cm/s. Right Atrium: Right atrial size was severely dilated. Pericardium: A moderately sized pericardial effusion is present. The pericardial effusion is posterior to the left ventricle. Mitral Valve: Many degnerative changes on the mitral valve. There is a small (2 mm) hypermobile echodensity on the ventricular surface of the anterior leaflet. Cannot exclude small vegetation. The mitral valve is degenerative in appearance. Moderate mitral annular calcification. Mild mitral valve regurgitation. Tricuspid Valve: Functional regurgitation. Anterior-septal coaptation gap 3 mm, AP coaptation gap 4 mm. The tricuspid valve is normal in structure. Tricuspid valve regurgitation is severe. No evidence of tricuspid stenosis. The flow in the hepatic veins is reversed during ventricular systole. Aortic Valve: The aortic valve is tricuspid. Aortic valve regurgitation is mild. No aortic stenosis is present. Pulmonic Valve: The pulmonic valve was normal in structure. Pulmonic valve regurgitation is trivial. No evidence of pulmonic stenosis. Aorta: The aortic root, ascending aorta, aortic arch and descending aorta are all structurally normal, with no evidence of dilitation or obstruction. Venous: A systolic blunting flow pattern is recorded from the left upper pulmonary vein. The inferior vena cava is dilated in size with less than 50% respiratory variability, suggesting right atrial pressure of 15 mmHg. The inferior vena cava and the hepatic vein show a pattern of systolic flow reversal, suggestive of tricuspid regurgitation. IAS/Shunts: There is left bowing of the  interatrial septum, suggestive of elevated right atrial pressure. The atrial septum is grossly normal. Additional Comments: 3D was performed not requiring image post processing on an independent workstation and was abnormal. Spectral Doppler performed.  3D Volume EF LV 3D EF:  Left ventricular ejection fraction by 3D volume is 44              %. LV 3D EDV:   117.24 ml LV 3D ESV:   65.83 ml  3D Volume EF: 3D EF:        44 %  AORTA Ao Asc diam: 3.80 cm TRICUSPID VALVE TR Peak grad:   28.3 mmHg TR Vmax:        266.00 cm/s Riley Lam MD Electronically signed by Riley Lam MD Signature Date/Time: 09/23/2023/6:41:34 PM    Final    EP STUDY Result Date: 09/23/2023 See surgical note for result.  ECHOCARDIOGRAM COMPLETE Result Date: 09/19/2023    ECHOCARDIOGRAM REPORT   Patient Name:   [name] Date of Exam: 09/19/2023 Medical Rec #:  [id number]         Height:       71.0 in Accession #:    [id number]        Weight:       155.0 lb Date of Birth:  09/29/1950         BSA:          1.892 m Patient Age:    72 years          BP:           130/62 mmHg Patient Gender: M                 HR:           74 bpm. Exam Location:  Inpatient Procedure: 2D Echo, 3D Echo, Strain Analysis, Cardiac Doppler and Color Doppler Indications:    Acute Resiratory distress  History:        Patient has prior history of Echocardiogram examinations, most                 recent 09/16/2020. Risk Factors:Diabetes, Hypertension and                 Dyslipidemia.  Sonographer:    Raeford Razor RDCS Referring Phys: [id number] JUSTIN B HOWERTER IMPRESSIONS  1. Left ventricular ejection fraction, by estimation, is 45 to 50%. Left ventricular ejection fraction by 3D volume is 49 %. The left ventricle has mildly decreased function. The left ventricle demonstrates global hypokinesis. There is mild left ventricular hypertrophy. Left ventricular diastolic parameters are indeterminate. Elevated left ventricular end-diastolic pressure.  2. Right  ventricular systolic function is normal. The right ventricular size is mildly enlarged. There is normal pulmonary artery systolic pressure. The estimated right ventricular systolic pressure is 29.3 mmHg.  3. Left atrial size was moderately dilated.  4. Right atrial size was severely dilated.  5. Moderate pleural effusion in the left lateral region.  6. The mitral valve is abnormal. Trivial mitral valve regurgitation. No evidence of mitral stenosis. Moderate mitral annular calcification.  7. The tricuspid valve is abnormal. Tricuspid valve regurgitation is mild to moderate.  8. The aortic valve has an indeterminant number of cusps. Aortic valve regurgitation is mild visually but moderate by PHT due to diastolic dysfunction. Mild aortic valve stenosis. Aortic regurgitation PHT measures 335 msec. Aortic valve area, by VTI measures 1.73 cm. Aortic valve mean gradient measures 8.3 mmHg. Aortic valve Vmax measures 2.06 m/s.  9. The inferior vena cava is dilated in size with >50% respiratory variability, suggesting right atrial pressure of 8 mmHg. Comparison(s): Changes from prior study are noted. LVEF worsened from normal to 45-50% now. FINDINGS  Left Ventricle: Left ventricular ejection fraction,  by estimation, is 45 to 50%. Left ventricular ejection fraction by 3D volume is 49 %. The left ventricle has mildly decreased function. The left ventricle demonstrates global hypokinesis. The left ventricular internal cavity size was normal in size. There is mild left ventricular hypertrophy. Left ventricular diastolic parameters are indeterminate. Elevated left ventricular end-diastolic pressure. Right Ventricle: The right ventricular size is mildly enlarged. No increase in right ventricular wall thickness. Right ventricular systolic function is normal. There is normal pulmonary artery systolic pressure. The tricuspid regurgitant velocity is 2.31  m/s, and with an assumed right atrial pressure of 8 mmHg, the estimated right  ventricular systolic pressure is 29.3 mmHg. Left Atrium: Left atrial size was moderately dilated. Right Atrium: Right atrial size was severely dilated. Pericardium: There is no evidence of pericardial effusion. Mitral Valve: The mitral valve is abnormal. Moderate mitral annular calcification. Trivial mitral valve regurgitation. No evidence of mitral valve stenosis. Tricuspid Valve: The tricuspid valve is abnormal. Tricuspid valve regurgitation is mild to moderate. No evidence of tricuspid stenosis. Aortic Valve: The aortic valve has an indeterminant number of cusps. Aortic valve regurgitation is mild. Aortic regurgitation PHT measures 335 msec. Mild aortic stenosis is present. Aortic valve mean gradient measures 8.3 mmHg. Aortic valve peak gradient  measures 17.0 mmHg. Aortic valve area, by VTI measures 1.73 cm. Pulmonic Valve: The pulmonic valve was normal in structure. Pulmonic valve regurgitation is mild. No evidence of pulmonic stenosis. Aorta: The aortic root and ascending aorta are structurally normal, with no evidence of dilitation. Venous: The inferior vena cava is dilated in size with greater than 50% respiratory variability, suggesting right atrial pressure of 8 mmHg. IAS/Shunts: There is redundancy of the interatrial septum. No atrial level shunt detected by color flow Doppler. Additional Comments: There is a moderate pleural effusion in the left lateral region.  LEFT VENTRICLE PLAX 2D LVIDd:         5.00 cm         Diastology LVIDs:         3.70 cm         LV e' medial:    5.03 cm/s LV PW:         1.30 cm         LV E/e' medial:  17.1 LV IVS:        1.30 cm         LV e' lateral:   8.60 cm/s LVOT diam:     2.20 cm         LV E/e' lateral: 10.0 LV SV:         59 LV SV Index:   31 LVOT Area:     3.80 cm        3D Volume EF                                LV 3D EF:    Left                                             ventricul                                             ar  ejection                                             fraction                                             by 3D                                             volume is                                             49 %.                                 3D Volume EF:                                3D EF:        49 %                                LV EDV:       204 ml                                LV ESV:       104 ml                                LV SV:        100 ml RIGHT VENTRICLE             IVC RV Basal diam:  4.80 cm     IVC diam: 2.20 cm RV Mid diam:    2.50 cm RV S prime:     14.30 cm/s TAPSE (M-mode): 2.5 cm LEFT ATRIUM              Index        RIGHT ATRIUM           Index LA diam:        4.20 cm  2.22 cm/m   RA Area:     26.60 cm LA Vol (A2C):   141.0 ml 74.52 ml/m  RA Volume:   90.60 ml  47.88 ml/m LA Vol (A4C):   49.7 ml  26.27 ml/m LA Biplane Vol: 83.1 ml  43.92 ml/m  AORTIC VALVE AV Area (Vmax):    1.46 cm AV Area (Vmean):   1.68 cm AV Area (VTI):     1.73 cm AV Vmax:           206.00 cm/s AV Vmean:          122.000 cm/s AV VTI:            0.341  m AV Peak Grad:      17.0 mmHg AV Mean Grad:      8.3 mmHg LVOT Vmax:         79.20 cm/s LVOT Vmean:        53.900 cm/s LVOT VTI:          0.155 m LVOT/AV VTI ratio: 0.45 AI PHT:            335 msec  AORTA Ao Root diam: 3.30 cm Ao Asc diam:  3.40 cm MITRAL VALVE               TRICUSPID VALVE MV Area (PHT): 3.72 cm    TR Peak grad:   21.3 mmHg MV Decel Time: 204 msec    TR Vmax:        231.00 cm/s MV E velocity: 85.80 cm/s MV A velocity: 53.70 cm/s  SHUNTS MV E/A ratio:  1.60        Systemic VTI:  0.16 m                            Systemic Diam: 2.20 cm Vishnu Priya Mallipeddi Electronically signed by Winfield Rast Mallipeddi Signature Date/Time: 09/19/2023/3:36:50 PM    Final    CT Head Wo Contrast Result Date: 09/18/2023 CLINICAL DATA:  Altered mental status. Tremors after dialysis appointment today. EXAM: CT HEAD WITHOUT CONTRAST TECHNIQUE:  Contiguous axial images were obtained from the base of the skull through the vertex without intravenous contrast. RADIATION DOSE REDUCTION: This exam was performed according to the departmental dose-optimization program which includes automated exposure control, adjustment of the mA and/or kV according to patient size and/or use of iterative reconstruction technique. COMPARISON:  09/07/2023. FINDINGS: Brain: No acute intracranial hemorrhage, midline shift or mass effect. No extra-axial fluid collection. Mild atrophy is noted. Periventricular white matter hypodensities are present bilaterally. No hydrocephalus. Vascular: No hyperdense vessel or unexpected calcification. Skull: Normal. Negative for fracture or focal lesion. Sinuses/Orbits: There is partial opacification of the ethmoid air cells and maxillary sinuses. Mucosal thickening is noted in the sphenoid sinuses bilaterally. No acute orbital abnormality. Other: None. IMPRESSION: 1. No acute intracranial process. 2. Atrophy with chronic microvascular ischemic changes. Electronically Signed   By: Thornell Sartorius M.D.   On: 09/18/2023 23:30   DG Chest 2 View Result Date: 09/18/2023 CLINICAL DATA:  Hypoxia and tremors. EXAM: CHEST - 2 VIEW COMPARISON:  September 11, 2023 FINDINGS: The cardiac silhouette is mildly enlarged and unchanged in size. Moderate severity calcification of the aortic arch is seen. Low lung volumes are noted with mild areas of atelectasis and/or infiltrate seen within the mid left lung and bilateral lung bases. Small bilateral pleural effusions are noted. No pneumothorax is identified. The visualized skeletal structures are unremarkable. IMPRESSION: 1. Cardiomegaly and low lung volumes with mild mid left lung and bibasilar atelectasis and/or infiltrate. 2. Small bilateral pleural effusions. Electronically Signed   By: Aram Candela M.D.   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 Risk Factors:      Hypertension, Diabetes, PAD. Limitations        Today's exam was limited due to the high bifurcation of the                    carotid, the patient's respiratory variation and patient                    positioning, patient movement, patient somnolence. Comparison Study:  No prior studies. Performing Technologist: Chanda Busing RVT  Examination Guidelines: A complete evaluation includes B-mode imaging, spectral Doppler, color Doppler, and power Doppler as needed of all accessible portions of each vessel. Bilateral testing is considered an integral part of a complete examination. Limited examinations for reoccurring indications may be performed as noted.  Right Carotid Findings: +----------+--------+--------+--------+--------------------------+--------+           PSV cm/sEDV cm/sStenosisPlaque Description        Comments +----------+--------+--------+--------+--------------------------+--------+ CCA Prox  1       0               smooth and heterogenous            +----------+--------+--------+--------+--------------------------+--------+ CCA Distal1       0               smooth and heterogenous            +----------+--------+--------+--------+--------------------------+--------+ ICA Prox  1       0               irregular and heterogenous         +----------+--------+--------+--------+--------------------------+--------+ ICA Mid   1       0                                         tortuous  +----------+--------+--------+--------+--------------------------+--------+ ICA Distal1       0                                         tortuous +----------+--------+--------+--------+--------------------------+--------+ ECA       1       0                                                  +----------+--------+--------+--------+--------------------------+--------+ +----------+--------+-------+--------+-------------------+           PSV cm/sEDV cmsDescribeArm Pressure (mmHG) +----------+--------+-------+--------+-------------------+ [id number]                                          +----------+--------+-------+--------+-------------------+ +---------+--------+--------+-----------------------+ VertebralPSV cm/sEDV cm/sAtypical and Retrograde +---------+--------+--------+-----------------------+  Left Carotid Findings: +----------+--------+--------+--------+-----------------------+--------+           PSV cm/sEDV cm/sStenosisPlaque Description     Comments +----------+--------+--------+--------+-----------------------+--------+ CCA Prox  1       0               smooth and heterogenous         +----------+--------+--------+--------+-----------------------+--------+ CCA Distal1  0               smooth and heterogenous         +----------+--------+--------+--------+-----------------------+--------+ ICA Prox  1       0               smooth and heterogenous         +----------+--------+--------+--------+-----------------------+--------+ ICA Mid   0       0                                      tortuous +----------+--------+--------+--------+-----------------------+--------+ ICA Distal1       0                                      tortuous +----------+--------+--------+--------+-----------------------+--------+ ECA       1       0                                                +----------+--------+--------+--------+-----------------------+--------+ +----------+--------+--------+--------+-------------------+           PSV cm/sEDV cm/sDescribeArm Pressure (mmHG) +----------+--------+--------+--------+-------------------+ [id number]                                           +----------+--------+--------+--------+-------------------+ +---------+--------+-+--------+-+--------+ VertebralPSV cm/s1EDV cm/s0Atypical +---------+--------+-+--------+-+--------+   Summary: Right Carotid: Velocities in the right ICA are consistent with a 1-39% stenosis. Left Carotid: Velocities in the left ICA are consistent with a 1-39% stenosis. Vertebrals: Right vertebral artery demonstrates retrograde flow. Atypical             waveforms bilaterally. *See table(s) above for measurements and observations.  Electronically signed by Lemar Livings MD on 09/12/2023 at 4:07:14 PM.    Final    DG CHEST PORT 1 VIEW Result Date: 09/11/2023 CLINICAL DATA:  Pneumonia EXAM: PORTABLE CHEST 1 VIEW COMPARISON:  09/07/2023 FINDINGS: With moderate cardiomegaly with small pleural effusions. No focal airspace consolidation or pulmonary edema. IMPRESSION: Moderate cardiomegaly with small pleural effusions. Electronically Signed   By: Deatra Robinson M.D.   On: 09/11/2023 21:28   MR ANGIO HEAD WO CONTRAST Result Date: 09/10/2023 CLINICAL DATA:  Concern for vertebral artery dissection, stenosis, or occlusion EXAM: MRA HEAD WITHOUT CONTRAST TECHNIQUE: Angiographic images of the Circle of Willis were acquired using MRA technique without intravenous contrast. COMPARISON:  No prior MRA available, correlation is made with MRI 09/09/2023 FINDINGS: Anterior circulation: Both internal carotid arteries are patent to the termini, with severe stenosis in the proximal right cavernous ICA and and distal left petrous ICA, and moderate stenosis in the distal right cavernous ICA, bilateral supraclinoid ICA, left cavernous ICA, and  distal left petrous ICA (series 2, images 68, 74, 80, and 87). A1 segments patent. The anterior communicating artery is not well seen due to artifact. Anterior cerebral arteries are patent to their distal aspects without significant stenosis. No M1 stenosis or occlusion. Distal MCA branches perfused to their distal aspects without significant stenosis. Posterior circulation: The left vertebral artery is patent to the vertebrobasilar junction with mild stenosis proximally (series 2, image 12). Poor signal throughout  the intracranial right vertebral artery, with superimposed moderate to severe stenosis proximally (series 2, image 13) and likely moderate stenosis distally (series 2, images 36 and 44). Basilar patent to its distal aspect. Superior cerebellar arteries patent proximally. Patent P1 segments, with mild stenosis in the bilateral P1 segments (series 2, image 109 and 112). PCAs are otherwise perfused to their distal aspects without significant stenosis. The bilateral posterior communicating arteries are not visualized. Anatomic variants: None significant IMPRESSION: 1. Poor signal throughout the intracranial right vertebral artery, with superimposed moderate to severe stenosis proximally and likely moderate stenosis distally. This could be better evaluated with a CTA of the head and neck if clinically indicated. 2. 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 3. Mild stenosis in the proximal left vertebral artery and bilateral P1 segments. Electronically Signed   By: Wiliam Ke M.D.   On: 09/10/2023 18:38   MR BRAIN WO CONTRAST Result Date: 09/09/2023 CLINICAL DATA:  Seizure, new onset, no history of trauma EXAM: MRI HEAD WITHOUT CONTRAST TECHNIQUE: Multiplanar, multiecho pulse sequences of the brain and surrounding structures were  obtained without intravenous contrast. COMPARISON:  None Available. FINDINGS: Evaluation is somewhat limited by motion artifact. Brain: The hippocampi are roughly symmetric in size and signal. No heterotopia or evidence of cortical dysgenesis. No restricted diffusion to suggest acute or subacute infarct. No acute hemorrhage, mass, mass effect, or midline shift. No hydrocephalus or extra-axial collection. Pituitary and craniocervical junction within normal limits. No hemosiderin deposition to suggest remote hemorrhage. Mildly advanced cerebral atrophy for age, with prominence of the ventricles and sulci. Confluent T2 hyperintense signal in the periventricular white matter, likely the sequela of moderate chronic small vessel ischemic disease. Vascular: Loss of the right vertebral artery flow void (series 5, image 6). Otherwise normal arterial flow voids. Skull and upper cervical spine: Normal marrow signal. Sinuses/Orbits: Mucous retention cysts in the left maxillary sinus. No acute finding in the orbits. Other: The mastoid air cells are well aerated. IMPRESSION: 1. No acute intracranial process. No seizure focus is identified. 2. Loss of the right vertebral artery flow void, which is nonspecific but can be seen in the setting of slow flow or occlusion. If there is clinical concern for vertebral artery pathology, consider further evaluation with CTA. Electronically Signed   By: Wiliam Ke M.D.   On: 09/09/2023 16:19   Overnight EEG with video Result Date: 09/08/2023 [name], MD     09/09/2023  8:52 AM Patient Name: [name] MRN: [id number] Epilepsy Attending: [name] Referring Physician/Provider: Jefferson Fuel, MD Duration: 09/07/2023 1744 to 09/08/2023 2300 Patient history: 73 year old man with a past medical history of DM2, end-stage renal disease on HD Monday Wednesday Friday who presented with R focal seizure in the setting of missing dialysis x11 days. EEG to evaluate for seizure  Level of alertness: Awake, asleep AEDs during EEG study: LEV Technical aspects: This EEG study was done with scalp electrodes positioned according to the 10-20 International system of electrode placement. Electrical activity was reviewed with band pass filter of 1-70Hz , sensitivity of 7 uV/mm, display speed of 56mm/sec with a 60Hz  notched filter applied as appropriate. EEG data were recorded continuously and digitally stored.  Video monitoring was available and reviewed as appropriate. Description: No posterior dominant rhythm was seen. Sleep was characterized by sleep spindles (12 to  14 Hz), maximal frontocentral region. EEG showed continuous generalized polymorphic sharply contoured 3 to 6 Hz theta-delta slowing. Generalized periodic discharges with triphasic morphology were also noted at 1 to 1.5 Hz.  Hyperventilation and photic stimulation were not performed.   EEG was disconnected between 09/08/2023 1503 to 2011 for HD ABNORMALITY -Periodic discharges with triphasic morphology, generalized - Continuous slow, generalized IMPRESSION: This study is suggestive of moderate diffuse encephalopathy most likely secondary to toxic-metabolic causes. No seizures or definite epileptiform discharges were seen throughout the recording. Priyanka Annabelle Harman   US Abdomen Limited RUQ (LIVER/GB) Result Date: 09/07/2023 CLINICAL DATA:  Elevated LFTs. EXAM: ULTRASOUND ABDOMEN LIMITED RIGHT UPPER QUADRANT COMPARISON:  None Available. FINDINGS: Gallbladder: Physiologically distended. No gallstones. Upper normal gallbladder wall thickness of 3 mm, nonspecific in the presence of ascites. No sonographic Murphy sign noted by sonographer. Common bile duct: Diameter: 6 mm, normal. Liver: No focal lesion identified. Within normal limits in parenchymal echogenicity. No convincing capsular nodularity. Portal vein is patent on color Doppler imaging with normal direction of blood flow towards the liver. Other: Moderate right upper quadrant  ascites. IMPRESSION: 1. Moderate right upper quadrant ascites. No definite morphologic changes of cirrhosis. 2. Upper normal gallbladder wall thickness is nonspecific in setting of ascites. No gallstones. 3. No biliary dilatation or focal liver lesion. Electronically Signed   By: Narda Rutherford M.D.   On: 09/07/2023 21:58   DG Chest Portable 1 View Result Date: 09/07/2023 CLINICAL DATA:  Altered mental status.  Code stroke EXAM: PORTABLE CHEST 1 VIEW COMPARISON:  10/05/2022 FINDINGS: Similar marked cardiomegaly. Aortic atherosclerotic calcification. Pulmonary vascular congestion and interstitial coarsening. Small bilateral pleural effusions. No pneumothorax. IMPRESSION: Cardiomegaly with pulmonary edema and small bilateral pleural effusions. Electronically Signed   By: Minerva Fester M.D.   On: 09/07/2023 19:51   CT HEAD CODE STROKE WO CONTRAST Result Date: 09/07/2023 CLINICAL DATA:  Code stroke.  Unresponsive EXAM: CT HEAD WITHOUT CONTRAST TECHNIQUE: Contiguous axial images were obtained from the base of the skull through the vertex without intravenous contrast. RADIATION DOSE REDUCTION: This exam was performed according to the departmental dose-optimization program which includes automated exposure control, adjustment of the mA and/or kV according to patient size and/or use of iterative reconstruction technique. COMPARISON:  None Available. FINDINGS: Brain: Possible loss of gray-white differentiation bilateral anterior frontal lobes (series 2, image 25) is favored to be artifactual. No other evidence of acute infarct. No evidence of acute hemorrhage, mass, mass effect, or midline shift. No hydrocephalus or extra-axial collection. Vascular: No hyperdense vessel. Atherosclerotic calcifications in the intracranial carotid and vertebral arteries. Skull: Negative for fracture or focal lesion. Sinuses/Orbits: Mucosal thickening and mucous retention cysts in the maxillary sinuses. No acute finding in the orbits.  Other: The mastoid air cells are well aerated. ASPECTS ([place] Stroke Program Early CT Score) - Ganglionic level infarction (caudate, lentiform nuclei, internal capsule, insula, M1-M3 cortex): 7 - Supraganglionic infarction (M4-M6 cortex): 3 Total score (0-10 with 10 being normal): 10 IMPRESSION: Possible loss of gray-white differentiation in the bilateral anterior frontal lobes, likely artifactual. No other evidence of acute infarct. No acute intracranial hemorrhage. Imaging results were communicated on 09/07/2023 at 4:35 pm to provider STACK via secure text paging. Electronically Signed   By: Wiliam Ke M.D.   On: 09/07/2023 16:35     TODAY-DAY OF DISCHARGE:  Subjective:   [name] today has no headache,no chest abdominal pain,no new weakness tingling or numbness, feels much better wants to go home today.  Objective:  Blood pressure (!) 117/55, pulse 79, temperature 97.7 F (36.5 C), temperature source Oral, resp. rate 20, weight 72.6 kg, SpO2 93%.  Intake/Output Summary (Last 24 hours) at 09/24/2023 1110 Last data filed at 09/23/2023 1828 Gross per 24 hour  Intake 250 ml  Output 1000 ml  Net -750 ml   Filed Weights   09/21/23 1355 09/23/23 0500 09/24/23 0500  Weight: 69.5 kg 67.1 kg 72.6 kg    Exam: Awake Alert, Oriented *3, No new F.N deficits, Normal affect [place].AT,PERRAL Supple Neck,No JVD, No cervical lymphadenopathy appriciated.  Symmetrical Chest wall movement, Good air movement bilaterally, CTAB RRR,No Gallops,Rubs or new Murmurs, No Parasternal Heave +ve B.Sounds, Abd Soft, Non tender, No organomegaly appriciated, No rebound -guarding or rigidity. No Cyanosis, Clubbing or edema, No new Rash or bruise   PERTINENT RADIOLOGIC STUDIES: ECHO TEE Result Date: 09/23/2023    TRANSESOPHOGEAL ECHO REPORT   Patient Name:   [name] Date of Exam: 09/23/2023 Medical Rec #:  [id number]         Height:       71.0 in Accession #:    [id number]        Weight:        147.9 lb Date of Birth:  1951-08-04         BSA:          1.855 m Patient Age:    72 years          BP:           179/93 mmHg Patient Gender: M                 HR:           81 bpm. Exam Location:  Inpatient Procedure: Transesophageal Echo, 3D Echo, Color Doppler and Cardiac Doppler            (Both Spectral and Color Flow Doppler were utilized during            procedure). Indications:     Bacteremia  History:         Patient has prior history of Echocardiogram examinations.  Sonographer:     Irving Burton Senior RDCS Referring Phys:  [id number] Jonita Albee Diagnosing Phys: Riley Lam MD PROCEDURE: After discussion of the risks and benefits of a TEE, an informed consent was obtained from the patient. The transesophogeal probe was passed without difficulty through the esophogus of the patient. Sedation performed by different physician. The patient was monitored while under deep sedation. Anesthestetic sedation was provided intravenously by Anesthesiology: 214mg  of Propofol, 60mg  of Lidocaine. The patient developed no complications during the procedure.  IMPRESSIONS  1. Left ventricular ejection fraction, by estimation, is 40 to 45%. Left ventricular ejection fraction by 3D volume is 44 %. The left ventricle has mildly decreased function.  2. Normal RV Free wall strain. Right ventricular systolic function is normal. The right ventricular size is mildly enlarged.  3. Left atrial size was severely dilated. No left atrial/left atrial appendage thrombus was detected. The LAA emptying velocity was 55 cm/s.  4. Right atrial size was severely dilated.  5. Moderate pericardial effusion. The pericardial effusion is posterior to the left ventricle.  6. Many degnerative changes on the mitral valve. There is a small (2 mm) hypermobile echodensity on the ventricular surface of the anterior leaflet. Cannot exclude small vegetation . The mitral valve is degenerative. Mild mitral valve regurgitation. Moderate mitral annular  calcification.  7. Functional regurgitation. Anterior-septal coaptation gap  3 mm, AP coaptation gap 4 mm. Tricuspid valve regurgitation is severe.  8. The aortic valve is tricuspid. Aortic valve regurgitation is mild. No aortic stenosis is present.  9. The inferior vena cava is dilated in size with <50% respiratory variability, suggesting right atrial pressure of 15 mmHg. 10. 3D performed of the tricuspid valve and demonstrates Severe tricuspid regurgitation that is anterior-septal in nature. This is atrial functional in nature. 3D VCA 0.599 cm2. FINDINGS  Left Ventricle: Left ventricular ejection fraction, by estimation, is 40 to 45%. Left ventricular ejection fraction by 3D volume is 44 %. The left ventricle has mildly decreased function. The left ventricular internal cavity size was normal in size. Right Ventricle: Normal RV Free wall strain. The right ventricular size is mildly enlarged. No increase in right ventricular wall thickness. Right ventricular systolic function is normal. Left Atrium: Left atrial size was severely dilated. No left atrial/left atrial appendage thrombus was detected. The LAA emptying velocity was 55 cm/s. Right Atrium: Right atrial size was severely dilated. Pericardium: A moderately sized pericardial effusion is present. The pericardial effusion is posterior to the left ventricle. Mitral Valve: Many degnerative changes on the mitral valve. There is a small (2 mm) hypermobile echodensity on the ventricular surface of the anterior leaflet. Cannot exclude small vegetation. The mitral valve is degenerative in appearance. Moderate mitral annular calcification. Mild mitral valve regurgitation. Tricuspid Valve: Functional regurgitation. Anterior-septal coaptation gap 3 mm, AP coaptation gap 4 mm. The tricuspid valve is normal in structure. Tricuspid valve regurgitation is severe. No evidence of tricuspid stenosis. The flow in the hepatic veins is reversed during ventricular systole. Aortic  Valve: The aortic valve is tricuspid. Aortic valve regurgitation is mild. No aortic stenosis is present. Pulmonic Valve: The pulmonic valve was normal in structure. Pulmonic valve regurgitation is trivial. No evidence of pulmonic stenosis. Aorta: The aortic root, ascending aorta, aortic arch and descending aorta are all structurally normal, with no evidence of dilitation or obstruction. Venous: A systolic blunting flow pattern is recorded from the left upper pulmonary vein. The inferior vena cava is dilated in size with less than 50% respiratory variability, suggesting right atrial pressure of 15 mmHg. The inferior vena cava and the hepatic vein show a pattern of systolic flow reversal, suggestive of tricuspid regurgitation. IAS/Shunts: There is left bowing of the interatrial septum, suggestive of elevated right atrial pressure. The atrial septum is grossly normal. Additional Comments: 3D was performed not requiring image post processing on an independent workstation and was abnormal. Spectral Doppler performed.  3D Volume EF LV 3D EF:    Left ventricular ejection fraction by 3D volume is 44              %. LV 3D EDV:   117.24 ml LV 3D ESV:   65.83 ml  3D Volume EF: 3D EF:        44 %  AORTA Ao Asc diam: 3.80 cm TRICUSPID VALVE TR Peak grad:   28.3 mmHg TR Vmax:        266.00 cm/s Riley Lam MD Electronically signed by Riley Lam MD Signature Date/Time: 09/23/2023/6:41:34 PM    Final    EP STUDY Result Date: 09/23/2023 See surgical note for result.    PERTINENT LAB RESULTS: CBC: Recent Labs    09/23/23 0501 09/24/23 0454  WBC 5.5 5.0  HGB 10.2* 10.2*  HCT 33.9* 33.6*  PLT 153 171   CMET CMP     Component Value Date/Time   NA 135 09/24/2023  0454   NA 135 (A) 04/03/2020 0000   K 4.5 09/24/2023 0454   CL 94 (L) 09/24/2023 0454   CO2 27 09/24/2023 0454   GLUCOSE 73 09/24/2023 0454   BUN 21 09/24/2023 0454   BUN 53 (A) 03/29/2020 0000   CREATININE 5.96 (H) 09/24/2023 0454    CALCIUM 9.4 09/24/2023 0454   CALCIUM 7.0 (L) 09/08/2010 1950   PROT 5.4 (L) 09/19/2023 0458   ALBUMIN 2.2 (L) 09/24/2023 0454   AST 22 09/19/2023 0458   ALT 37 09/19/2023 0458   ALKPHOS 72 09/19/2023 0458   BILITOT 0.7 09/19/2023 0458   GFR 6.41 (LL) 09/15/2023 1407   GFRNONAA 10 (L) 09/24/2023 0454    GFR Estimated Creatinine Clearance: 11.8 mL/min (A) (by C-G formula based on SCr of 5.96 mg/dL (H)). No results for input(s): "LIPASE", "AMYLASE" in the last 72 hours. No results for input(s): "CKTOTAL", "CKMB", "CKMBINDEX", "TROPONINI" in the last 72 hours. Invalid input(s): "POCBNP" No results for input(s): "DDIMER" in the last 72 hours. No results for input(s): "HGBA1C" in the last 72 hours. No results for input(s): "CHOL", "HDL", "LDLCALC", "TRIG", "CHOLHDL", "LDLDIRECT" in the last 72 hours. No results for input(s): "TSH", "T4TOTAL", "T3FREE", "THYROIDAB" in the last 72 hours.  Invalid input(s): "FREET3" No results for input(s): "VITAMINB12", "FOLATE", "FERRITIN", "TIBC", "IRON", "RETICCTPCT" in the last 72 hours. Coags: No results for input(s): "INR" in the last 72 hours.  Invalid input(s): "PT" Microbiology: Recent Results (from the past 240 hours)  Resp panel by RT-PCR (RSV, Flu A&B, Covid) Peripheral     Status: Abnormal   Collection Time: 09/18/23  6:57 PM   Specimen: Peripheral; Nasal Swab  Result Value Ref Range Status   SARS Coronavirus 2 by RT PCR NEGATIVE NEGATIVE Final   Influenza A by PCR POSITIVE (A) NEGATIVE Final   Influenza B by PCR NEGATIVE NEGATIVE Final    Comment: (NOTE) The Xpert Xpress SARS-CoV-2/FLU/RSV plus assay is intended as an aid in the diagnosis of influenza from Nasopharyngeal swab specimens and should not be used as a sole basis for treatment. Nasal washings and aspirates are unacceptable for Xpert Xpress SARS-CoV-2/FLU/RSV testing.  Fact Sheet for Patients: BloggerCourse.com  Fact Sheet for Healthcare  Providers: SeriousBroker.it  This test is not yet approved or cleared by the [place] FDA and has been authorized for detection and/or diagnosis of SARS-CoV-2 by FDA under an Emergency Use Authorization (EUA). This EUA will remain in effect (meaning this test can be used) for the duration of the COVID-19 declaration under Section 564(b)(1) of the Act, 21 U.S.C. section 360bbb-3(b)(1), unless the authorization is terminated or revoked.     Resp Syncytial Virus by PCR NEGATIVE NEGATIVE Final    Comment: (NOTE) Fact Sheet for Patients: BloggerCourse.com  Fact Sheet for Healthcare Providers: SeriousBroker.it  This test is not yet approved or cleared by the [place] FDA and has been authorized for detection and/or diagnosis of SARS-CoV-2 by FDA under an Emergency Use Authorization (EUA). This EUA will remain in effect (meaning this test can be used) for the duration of the COVID-19 declaration under Section 564(b)(1) of the Act, 21 U.S.C. section 360bbb-3(b)(1), unless the authorization is terminated or revoked.  Performed at [facility], [address], [place], [place] [address]   Blood Culture (routine x 2)     Status: Abnormal   Collection Time: 09/18/23  7:00 PM   Specimen: BLOOD LEFT ARM  Result Value Ref Range Status   Specimen Description BLOOD  LEFT ARM  Final   Special Requests   Final    BOTTLES DRAWN AEROBIC AND ANAEROBIC Blood Culture results may not be optimal due to an inadequate volume of blood received in culture bottles   Culture  Setup Time   Final    GRAM POSITIVE COCCI IN BOTH AEROBIC AND ANAEROBIC BOTTLES CRITICAL RESULT CALLED TO, READ BACK BY AND VERIFIED WITH: PHARMD [id number] AT 1622 CAREN A., ADC Performed at [facility], [address], [place], [place] [address]    Culture STAPHYLOCOCCUS EPIDERMIDIS (A)  Final   Report Status 09/23/2023 FINAL  Final    Organism ID, Bacteria STAPHYLOCOCCUS EPIDERMIDIS  Final      Susceptibility   Staphylococcus epidermidis - MIC*    CIPROFLOXACIN <=0.5 SENSITIVE Sensitive     ERYTHROMYCIN <=0.25 SENSITIVE Sensitive     GENTAMICIN <=0.5 SENSITIVE Sensitive     OXACILLIN <=0.25 SENSITIVE Sensitive     TETRACYCLINE 2 SENSITIVE Sensitive     VANCOMYCIN 2 SENSITIVE Sensitive     TRIMETH/SULFA 20 SENSITIVE Sensitive     CLINDAMYCIN <=0.25 SENSITIVE Sensitive     RIFAMPIN <=0.5 SENSITIVE Sensitive     Inducible Clindamycin NEGATIVE Sensitive     * STAPHYLOCOCCUS EPIDERMIDIS  Blood Culture ID Panel (Reflexed)     Status: Abnormal   Collection Time: 09/18/23  7:00 PM  Result Value Ref Range Status   Enterococcus faecalis NOT DETECTED NOT DETECTED Final   Enterococcus Faecium NOT DETECTED NOT DETECTED Final   Listeria monocytogenes NOT DETECTED NOT DETECTED Final   Staphylococcus species DETECTED (A) NOT DETECTED Final    Comment: CRITICAL RESULT CALLED TO, READ BACK BY AND VERIFIED WITH: PHARMD [id number] AT 1622 CAREN A., ADC    Staphylococcus aureus (BCID) NOT DETECTED NOT DETECTED Final   Staphylococcus epidermidis DETECTED (A) NOT DETECTED Final    Comment: CRITICAL RESULT CALLED TO, READ BACK BY AND VERIFIED WITH: PHARMD [id number] AT 1622 CAREN A., ADC    Staphylococcus lugdunensis NOT DETECTED NOT DETECTED Final   Streptococcus species NOT DETECTED NOT DETECTED Final   Streptococcus agalactiae NOT DETECTED NOT DETECTED Final   Streptococcus pneumoniae NOT DETECTED NOT DETECTED Final   Streptococcus pyogenes NOT DETECTED NOT DETECTED Final   A.calcoaceticus-baumannii NOT DETECTED NOT DETECTED Final   Bacteroides fragilis NOT DETECTED NOT DETECTED Final   Enterobacterales NOT DETECTED NOT DETECTED Final   Enterobacter cloacae complex NOT DETECTED NOT DETECTED Final   Escherichia coli NOT DETECTED NOT DETECTED Final   Klebsiella aerogenes NOT DETECTED NOT DETECTED Final   Klebsiella oxytoca NOT DETECTED  NOT DETECTED Final   Klebsiella pneumoniae NOT DETECTED NOT DETECTED Final   Proteus species NOT DETECTED NOT DETECTED Final   Salmonella species NOT DETECTED NOT DETECTED Final   Serratia marcescens NOT DETECTED NOT DETECTED Final   Haemophilus influenzae NOT DETECTED NOT DETECTED Final   Neisseria meningitidis NOT DETECTED NOT DETECTED Final   Pseudomonas aeruginosa NOT DETECTED NOT DETECTED Final   Stenotrophomonas maltophilia NOT DETECTED NOT DETECTED Final   Candida albicans NOT DETECTED NOT DETECTED Final   Candida auris NOT DETECTED NOT DETECTED Final   Candida glabrata NOT DETECTED NOT DETECTED Final   Candida krusei NOT DETECTED NOT DETECTED Final   Candida parapsilosis NOT DETECTED NOT DETECTED Final   Candida tropicalis NOT DETECTED NOT DETECTED Final   Cryptococcus neoformans/gattii NOT DETECTED NOT DETECTED Final   Methicillin resistance mecA/C NOT DETECTED NOT DETECTED Final    Comment: Performed at [facility], [address], [place], [place] [address]  Blood Culture (routine x 2)     Status: Abnormal   Collection Time: 09/18/23  7:29 PM   Specimen: BLOOD LEFT HAND  Result Value Ref Range Status   Specimen Description BLOOD LEFT HAND  Final   Special Requests   Final    BOTTLES DRAWN AEROBIC ONLY Blood Culture results may not be optimal due to an inadequate volume of blood received in culture bottles   Culture  Setup Time   Final    GRAM POSITIVE COCCI AEROBIC BOTTLE ONLY CRITICAL VALUE NOTED.  VALUE IS CONSISTENT WITH PREVIOUSLY REPORTED AND CALLED VALUE. Performed at [facility], [address], [place], [place] [address]    Culture STAPHYLOCOCCUS EPIDERMIDIS (A)  Final   Report Status 09/22/2023 FINAL  Final   Organism ID, Bacteria STAPHYLOCOCCUS EPIDERMIDIS  Final      Susceptibility   Staphylococcus epidermidis - MIC*    CIPROFLOXACIN <=0.5 SENSITIVE Sensitive     ERYTHROMYCIN <=0.25 SENSITIVE Sensitive     GENTAMICIN <=0.5 SENSITIVE Sensitive      OXACILLIN <=0.25 SENSITIVE Sensitive     TETRACYCLINE 2 SENSITIVE Sensitive     VANCOMYCIN 2 SENSITIVE Sensitive     TRIMETH/SULFA 20 SENSITIVE Sensitive     CLINDAMYCIN <=0.25 SENSITIVE Sensitive     RIFAMPIN <=0.5 SENSITIVE Sensitive     Inducible Clindamycin NEGATIVE Sensitive     * STAPHYLOCOCCUS EPIDERMIDIS  Culture, blood (single) w Reflex to ID Panel     Status: None (Preliminary result)   Collection Time: 09/21/23  8:13 AM   Specimen: BLOOD LEFT ARM  Result Value Ref Range Status   Specimen Description BLOOD LEFT ARM  Final   Special Requests   Final    BOTTLES DRAWN AEROBIC AND ANAEROBIC Blood Culture adequate volume   Culture   Final    NO GROWTH 3 DAYS Performed at [facility], [address], [place], [place] [address]    Report Status PENDING  Incomplete    FURTHER DISCHARGE INSTRUCTIONS:  Get Medicines reviewed and adjusted: Please take all your medications with you for your next visit with your Primary MD  Laboratory/radiological data: Please request your Primary MD to go over all hospital tests and procedure/radiological results at the follow up, please ask your Primary MD to get all Hospital records sent to his/her office.  In some cases, they will be blood work, cultures and biopsy results pending at the time of your discharge. Please request that your primary care M.D. goes through all the records of your hospital data and follows up on these results.  Also Note the following: If you experience worsening of your admission symptoms, develop shortness of breath, life threatening emergency, suicidal or homicidal thoughts you must seek medical attention immediately by calling 911 or calling your MD immediately  if symptoms less severe.  You must read complete instructions/literature along with all the possible adverse reactions/side effects for all the Medicines you take and that have been prescribed to you. Take any new Medicines after you have completely  understood and accpet all the possible adverse reactions/side effects.   Do not drive when taking Pain medications or sleeping medications (Benzodaizepines)  Do not take more than prescribed Pain, Sleep and Anxiety Medications. It is not advisable to combine anxiety,sleep and pain medications without talking with your primary care practitioner  Special Instructions: If you have smoked or chewed Tobacco  in the last 2 yrs please  stop smoking, stop any regular Alcohol  and or any Recreational drug use.  Wear Seat belts while driving.  Please note: You were cared for by a hospitalist during your hospital stay. Once you are discharged, your primary care physician will handle any further medical issues. Please note that NO REFILLS for any discharge medications will be authorized once you are discharged, as it is imperative that you return to your primary care physician (or establish a relationship with a primary care physician if you do not have one) for your post hospital discharge needs so that they can reassess your need for medications and monitor your lab values.  Total Time spent coordinating discharge including counseling, education and face to face time equals greater than 30 minutes.  SignedJeoffrey Massed 09/24/2023 11:10 AM

## 2023-09-24 NOTE — TOC Transition Note (Signed)
Transition of Care [facility]) - Discharge Note   Patient Details  Name: [name] MRN: [id number] Date of Birth: 29-Aug-1950  Transition of Care [facility]) CM/SW Contact:  Gordy Clement, RN Phone Number: 09/24/2023, 11:20 AM   Clinical Narrative:     Patient will DC to home today with spouse. Spouse has an appointment this am and will come back to get patient. Home health PT/OT has been recommended and will be provided by [facility]. No DME has been recommended. Patient will receive IVABX at HD appointments. Patient will follow up as directed on AVS              Patient Goals and CMS Choice            Discharge Placement                       Discharge Plan and Services Additional resources added to the After Visit Summary for                                       Social Drivers of Health (SDOH) Interventions SDOH Screenings   Food Insecurity: No Food Insecurity (09/21/2023)  Housing: Low Risk  (09/21/2023)  Transportation Needs: Unmet Transportation Needs (09/21/2023)  Utilities: Not At Risk (09/21/2023)  Depression (PHQ2-9): Low Risk  (11/25/2022)  Social Connections: Unknown (09/21/2023)  Tobacco Use: Low Risk  (09/23/2023)     Readmission Risk Interventions     No data to display

## 2023-09-24 NOTE — Progress Notes (Signed)
D/C order noted. Contacted [name] to be advised of pt's d/c today and that pt should resume care tomorrow. Clinic also advised pt to need iv abx with HD and renal NP will send orders.   Olivia Canter Renal Navigator [phone]

## 2023-09-25 NOTE — Discharge Planning (Signed)
[facility] Patient Discharge Orders- [facility] CLINIC: [facility]  Patient's name: [name] Admit/DC Dates: 09/18/2023 - 09/24/2023  Discharge Diagnoses: Sepsis due to flu A infection - Found to have staph epi bacteremia. Blood cultures from 2/10 NGTD X 3 days. 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 See below for ABX plan   Aranesp: Given: No     Last Hgb: 10.2 PRBC's Given: No  ESA dose for discharge: N/A IV Iron dose at discharge: Hold Fe load while on ABXs. Resume Fe load when ABXs are completed!  Heparin change: No  EDW Change: Yes New EDW: Lower to 69.7kg. Appears he was under EDW here (lower for accuracy).  Bath Change: No  Access intervention/Change: No  Hectorol change: No  Discharge Labs: Calcium 9.4 Phosphorus 4.4 Albumin 2.2 K+ 4.5  IV Antibiotics: Yes Details: Sepsis due to flu A infection. Found to have staph epi bacteremia. Give Cefazolin 2GM IV with HD with end date 11/02/23 (6 weeks from neg blood culture 2/10).   On Coumadin?: No   OTHER/APPTS/LAB ORDERS:    D/C Meds to be reconciled by nurse after every discharge.  Completed By: Salome Holmes, NP   Reviewed by: MD:______ RN_______

## 2023-09-25 NOTE — TOC Transition Note (Signed)
Transition of Care - Initial Contact from Inpatient Facility  Date of discharge: 09/24/23 Date of contact: 09/25/23 Method: Phone Spoke to: Patient  Patient contacted to discuss transition of care from recent inpatient hospitalization. Patient was admitted to [facility] from 09/18/23-09/24/23 with discharge diagnosis of Staph epi bacteremia. On ABXs with HD until 11/02/23.  The discharge medication list was reviewed. Patient understands the changes and has no concerns.   Patient will returned to his outpatient HD unit today at [facility].  Salome Holmes, NP

## 2023-09-26 LAB — CULTURE, BLOOD (SINGLE)
Culture: NO GROWTH
Special Requests: ADEQUATE

## 2023-09-30 ENCOUNTER — Telehealth: Payer: Self-pay

## 2023-09-30 ENCOUNTER — Ambulatory Visit: Payer: Self-pay | Admitting: Internal Medicine

## 2023-09-30 NOTE — Telephone Encounter (Signed)
Chief Complaint: Medication Question   Disposition: [] ED /[] Urgent Care (no appt availability in office) / [] Appointment(In office/virtual)/ []  [facility]/ [] Home Care/ [] Refused Recommended Disposition /[] [facility]/ [x]  Follow-up with PCP Additional Notes: Physical therapist called and stated the following medications were not found in the home and wanted to know if they were given at Dialysis: Calcitriol, Cefazolin, Cinacalcet, and guaifenesin. Advised that they are listed as Monday-Wednesday- Friday and indicates they are given at Dialysis. Advised if additional information is given he would be notified. Forwarding message to PCP to verify.  Copied from CRM [id number]. Topic: Clinical - Prescription Issue >> Sep 30, 2023 12:46 PM [place] A wrote: Reason for CRM: [facility] calling regarding patients medication Reason for Disposition  [1] Caller has medicine question about med NOT prescribed by PCP AND [2] triager unable to answer question (e.g., compatibility with other med, storage)  Answer Assessment - Initial Assessment Questions 1. NAME of MEDICINE: "What medicine(s) are you calling about?"     Calcitriol, Cefazolin, Cinacalcet, and guaifenesin  2. QUESTION: "What is your question?" (e.g., double dose of medicine, side effect)     Are these medication given at Dialysis 3. PRESCRIBER: "Who prescribed the medicine?" Reason: if prescribed by specialist, call should be referred to that group.     Multiple  4. SYMPTOMS: "Do you have any symptoms?" If Yes, ask: "What symptoms are you having?"  "How bad are the symptoms (e.g., mild, moderate, severe)     None  Protocols used: Medication Question Call-A-AH

## 2023-09-30 NOTE — Transitions of Care (Post Inpatient/ED Visit) (Signed)
   09/30/2023  Name: [name] MRN: [id number] DOB: [birth year]  Today's TOC FU Call Status: Today's TOC FU Call Status:: Unsuccessful Call (1st Attempt) Unsuccessful Call (1st Attempt) Date: 09/30/23  Attempted to reach the patient regarding the most recent Inpatient/ED visit.  Follow Up Plan: Additional outreach attempts will be made to reach the patient to complete the Transitions of Care (Post Inpatient/ED visit) call.  Unable to leave VM, VM box not set up  Signature Elyse Jarvis, CMA

## 2023-10-06 ENCOUNTER — Telehealth: Payer: Self-pay | Admitting: Internal Medicine

## 2023-10-06 ENCOUNTER — Ambulatory Visit: Payer: Self-pay | Admitting: Internal Medicine

## 2023-10-06 NOTE — Telephone Encounter (Signed)
 Copied from CRM [id number]. Topic: Clinical - Red Word Triage >> Oct 06, 2023 11:02 AM [name] wrote: Red Word that prompted transfer to Nurse Triage: Patient fell two days and has wobbly knee/pain in scalp  Chief Complaint: fall Symptoms: fall bumped front of head on door and fell down to land on buttocks: braced self with bilateral hands Frequency: 2 or 3 days ago Pertinent Negatives: Patient denies injury r/t fall , lacerations Disposition: [] ED /[] Urgent Care (no appt availability in office) / [] Appointment(In office/virtual)/ []  [facility]/ [] Home Care/ [] Refused Recommended Disposition /[] [facility]/ [x]  Follow-up with PCP Additional Notes: Home Health Nurse, Lele, RN of [facility].  Called to report fall, bilateral woobly knees, generalized pain: stated skilled nursing will see pt for 1x/6week.  Pt  stated ongoing -8/10 pain all over: scull pain: especially in skull and unsteady knees has been ongoing for some time now prior to fall: was told to make Excedrin for pain: pt states it helps a little then goes right back to where the pain was highest.  Pt stated fall was from misjudging the opening of the door: pt thought he could get through the opening but was not able to therefore bumped front of head on door and feel on his buttocks with bilateral hands used to support fall. Pt reported no injuries from fall. Attempt made to schedule hospital follow up: no opening available with PCP: will route to office to schedule.  Pt is dialysis pt on Mon, Wed, & Fri: therefore appts can not be made on these days.  Reason for Disposition . [1] MODERATE pain (e.g., interferes with normal activities) AND [2] present > 3 days . [1] Recent fall AND [2] no injury  Answer Assessment - Initial Assessment Questions 1. MECHANISM: "How did the fall happen?"     Fell hit front of head on door and fell back onto butt 2. DOMESTIC VIOLENCE AND ELDER ABUSE SCREENING: "Did you fall because someone  pushed you or tried to hurt you?" If Yes, ask: "Are you safe now?"     No - bumped into door  3. ONSET: "When did the fall happen?" (e.g., minutes, hours, or days ago)     X 2-3 days 4. LOCATION: "What part of the body hit the ground?" (e.g., back, buttocks, head, hips, knees, hands, head, stomach)     Buttocks and bilateral hands 5. INJURY: "Did you hurt (injure) yourself when you fell?" If Yes, ask: "What did you injure? Tell me more about this?" (e.g., body area; type of injury; pain severity)"     Bump on head 6. PAIN: "Is there any pain?" If Yes, ask: "How bad is the pain?" (e.g., Scale 1-10; or mild,  moderate, severe)   - NONE (0): No pain   - MILD (1-3): Doesn't interfere with normal activities    - MODERATE (4-7): Interferes with normal activities or awakens from sleep    - SEVERE (8-10): Excruciating pain, unable to do any normal activities      7 - 8/10 pain all over 7. SIZE: For cuts, bruises, or swelling, ask: "How large is it?" (e.g., inches or centimeters)      no 8. PREGNANCY: "Is there any chance you are pregnant?" "When was your last menstrual period?"     N/a 9. OTHER SYMPTOMS: "Do you have any other symptoms?" (e.g., dizziness, fever, weakness; new onset or worsening).      no 10. CAUSE: "What do you think caused the fall (  or falling)?" (e.g., tripped, dizzy spell)       Bumped into door  Answer Assessment - Initial Assessment Questions 1. ONSET: "When did the muscle aches or body pains start?"      ongoing 2. LOCATION: "What part of your body is hurting?" (e.g., entire body, arms, legs)      All over body 3. SEVERITY: "How bad is the pain?" (Scale 1-10; or mild, moderate, severe)   - MILD (1-3): doesn't interfere with normal activities    - MODERATE (4-7): interferes with normal activities or awakens from sleep    - SEVERE (8-10):  excruciating pain, unable to do any normal activities      7 to 8/10 pain all over - this has been and ongoing issue especially in  skull 4. CAUSE: "What do you think is causing the pains?"     unknown 5. FEVER: "Have you been having fever?"     no 6. OTHER SYMPTOMS: "Do you have any other symptoms?" (e.g., chest pain, weakness, rash, cold or flu symptoms, weight loss)     Bilateral wobbly knees 7. PREGNANCY: "Is there any chance you are pregnant?" "When was your last menstrual period?"     N/a 8. TRAVEL: "Have you traveled out of the country in the last month?" (e.g., travel history, exposures)     N/a  Protocols used: Falls and Falling-A-AH, Muscle Aches and Body Pain-A-AH

## 2023-10-06 NOTE — Telephone Encounter (Signed)
Verbals given  

## 2023-10-06 NOTE — Telephone Encounter (Signed)
 Can I give them ?

## 2023-10-06 NOTE — Telephone Encounter (Unsigned)
 Copied from CRM [id number]. Topic: Clinical - Home Health Verbal Orders >> Oct 06, 2023 10:56 AM Drema Balzarine wrote: Caller/Agency: [facility]  Callback Number: [phone] Service Requested: Skilled Nursing Frequency: 1 week for 6 weeks Any new concerns about the patient? No

## 2023-10-07 ENCOUNTER — Inpatient Hospital Stay: Payer: Medicare Other | Admitting: Internal Medicine

## 2023-10-08 NOTE — Telephone Encounter (Signed)
 Sree from [facility] called to check status of request for transfer bed. Says order can be sent to [facility] because they do not have DME department. Call back number is [phone].

## 2023-10-08 NOTE — Telephone Encounter (Signed)
 Sree from [facility] called back to follow up on the transfer bed. They said they hadn't heard anything back about it. They would like a call back at [phone].

## 2023-10-09 ENCOUNTER — Ambulatory Visit: Payer: Medicare Other | Admitting: Internal Medicine

## 2023-10-09 NOTE — Telephone Encounter (Signed)
**Note De-identified  [place] Obfuscation** Please advise 

## 2023-10-09 NOTE — Telephone Encounter (Signed)
 Can you please do the DME order so that I can send over

## 2023-10-13 ENCOUNTER — Other Ambulatory Visit: Payer: Self-pay | Admitting: Internal Medicine

## 2023-10-13 DIAGNOSIS — N186 End stage renal disease: Secondary | ICD-10-CM

## 2023-10-13 DIAGNOSIS — I5032 Chronic diastolic (congestive) heart failure: Secondary | ICD-10-CM

## 2023-10-13 DIAGNOSIS — G9341 Metabolic encephalopathy: Secondary | ICD-10-CM

## 2023-10-14 IMAGING — RF DG ANG/EXT/UNI/OR LEFT
1 series · 15 of 24 positions shown · non-contrast
Comparison: None.

CLINICAL DATA: End-stage renal disease and planning for dialysis
access.

EXAM:
PAK LOK HU/EXT/UNI/ OR

[Series 1: run · 11 acquisitions, 15 frames shown]
[im 1/11]
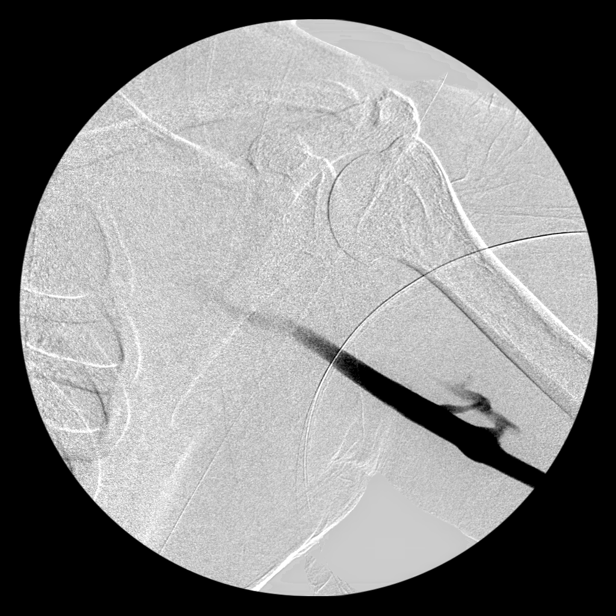
[im 2/11]
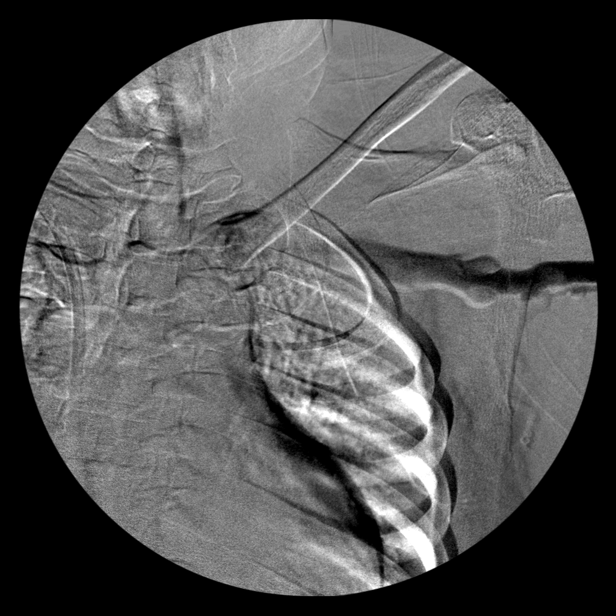
[im 2/11]
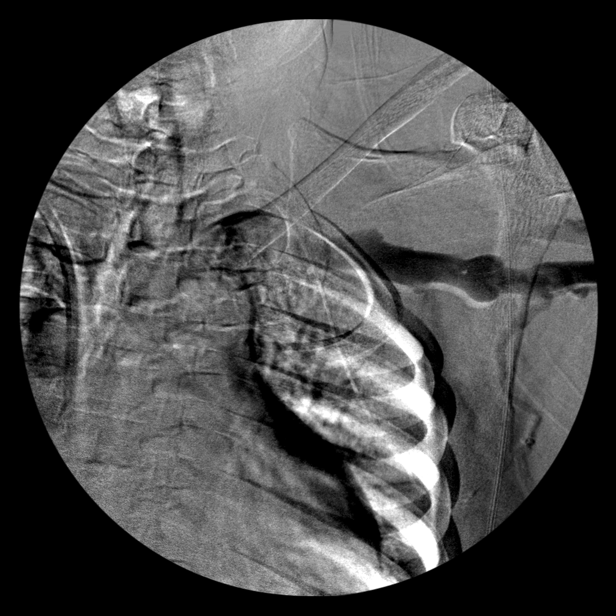
[im 3/11]
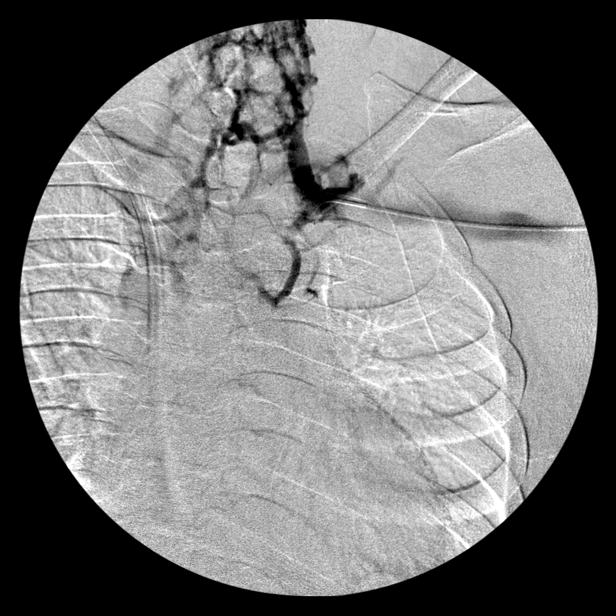
[im 4/11]
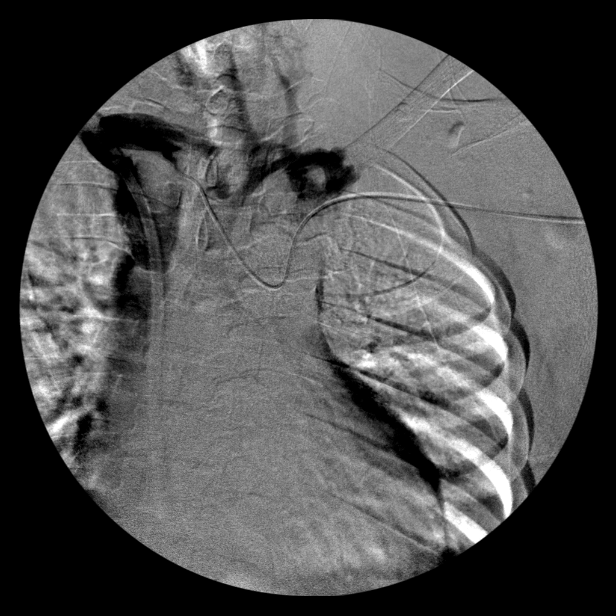
[im 4/11]
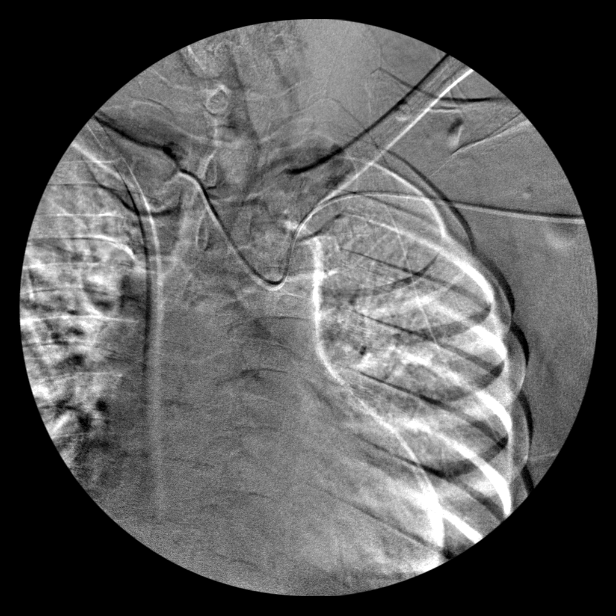
[im 5/11]
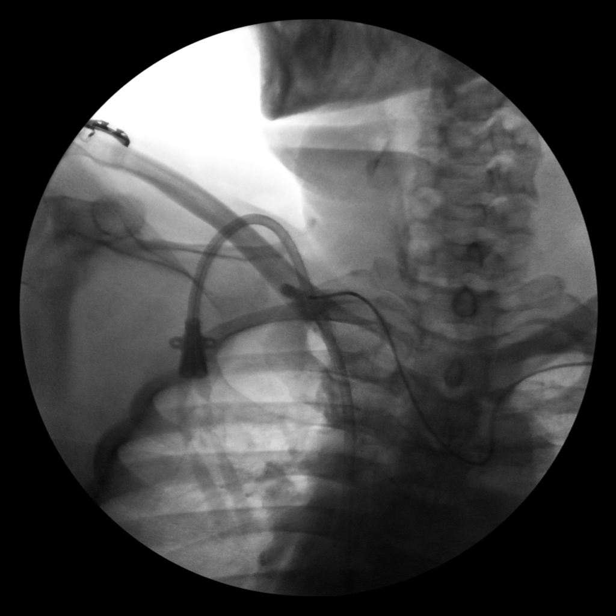
[im 6/11]
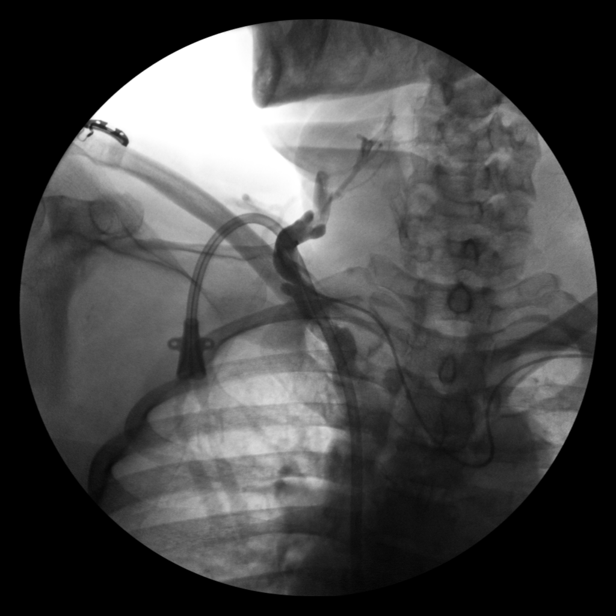
[im 7/11]
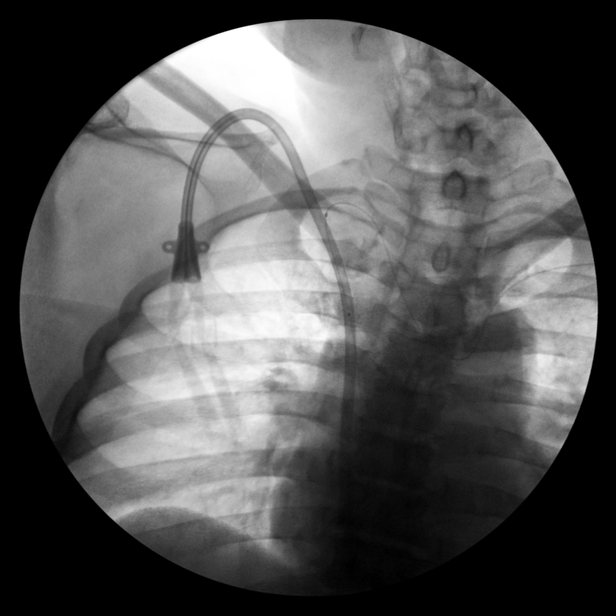
[im 8/11]
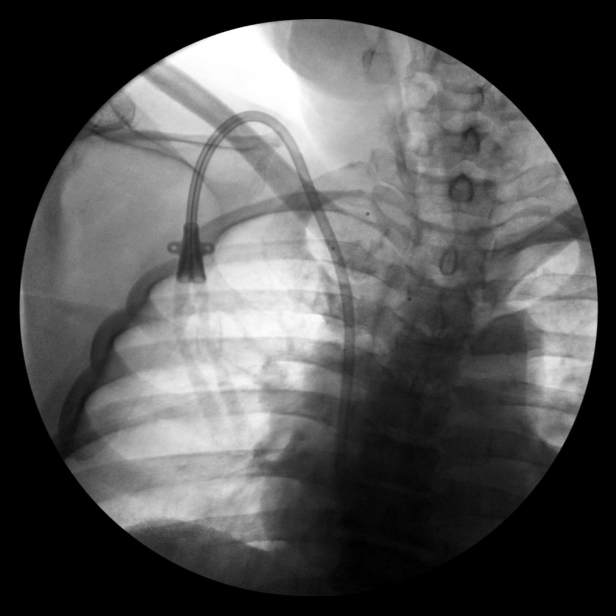
[im 8/11]
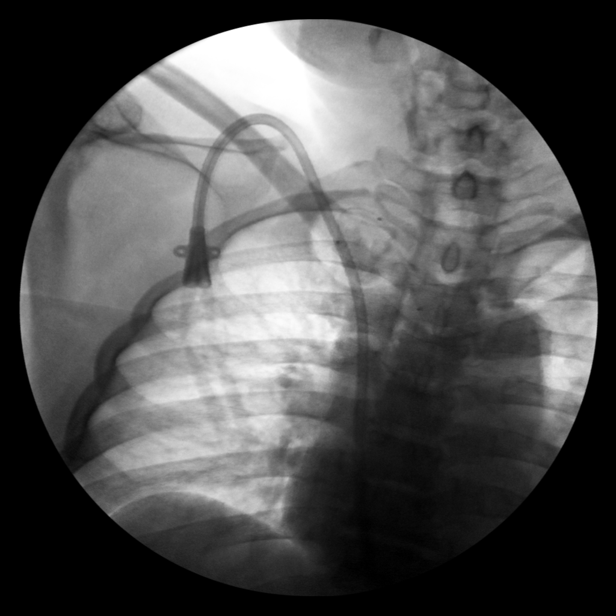
[im 9/11]
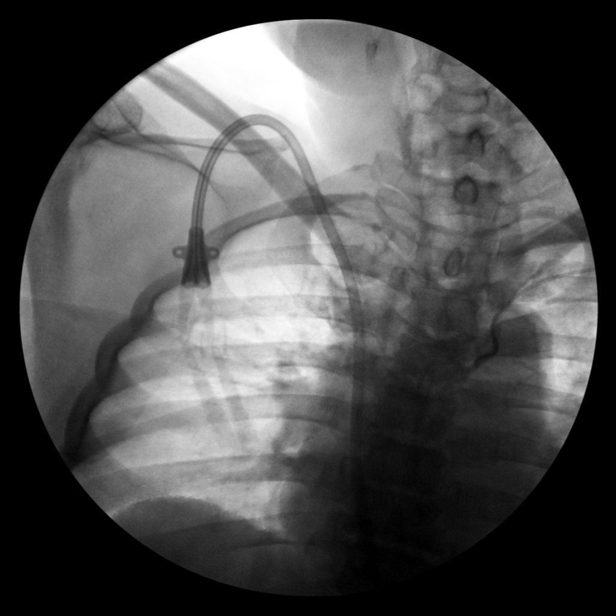
[im 10/11]
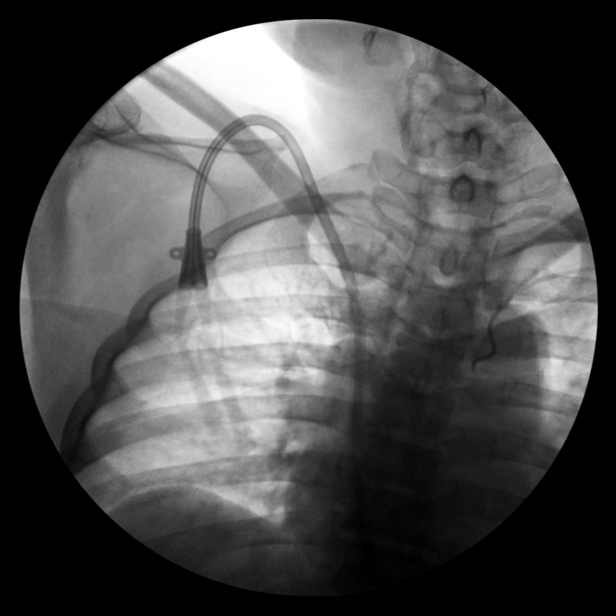
[im 10/11]
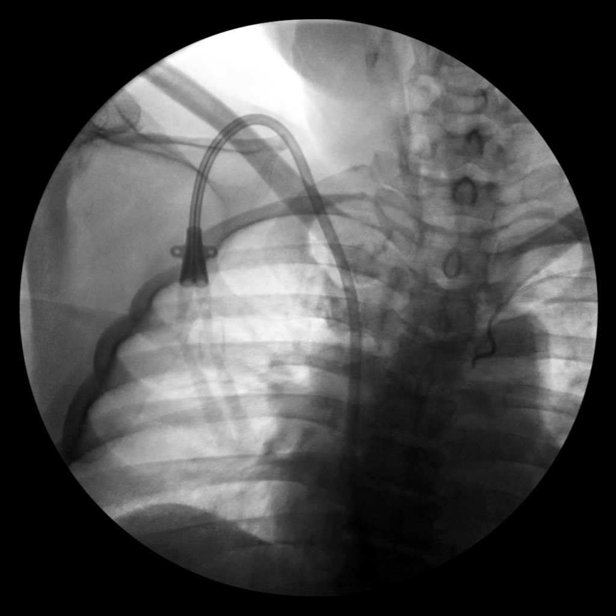
[im 11/11]
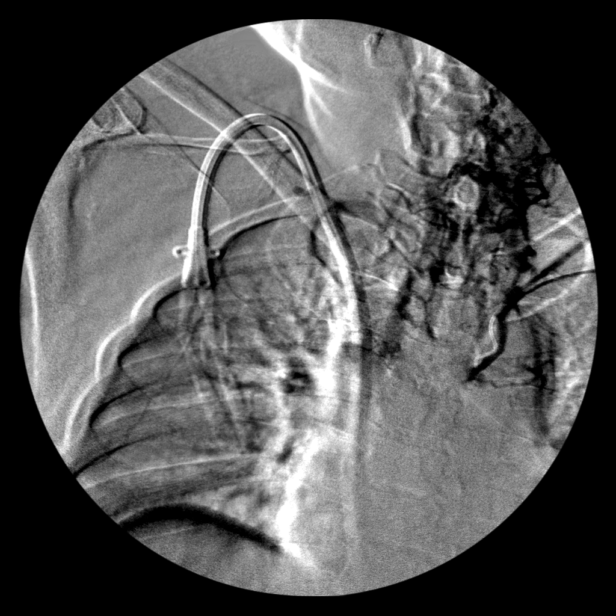

[15 of 24 positions shown; findings below may reference images not displayed]

FINDINGS: Intraoperative imaging was obtained with a C-arm. Submitted imaging
from a venogram demonstrates normal patency of the opacified left
axillary and subclavian veins. There is chronic occlusion of the
left brachiocephalic vein just beyond the juncture of the subclavian
and jugular veins with numerous collateral veins present extending
into the neck and across the chest reconstituting flow in the
contralateral jugular vein. A catheter was extended across a
transthoracic collateral into the base of the right neck with
contrast injection demonstrating patent collateral flow into the
right brachiocephalic vein and SVC with an indwelling right-sided
dialysis catheter present.
IMPRESSION: Chronic occlusion of left brachiocephalic vein at its origin.
Numerous collateral veins are present in the neck and across the
chest reconstituting flow in the contralateral right brachiocephalic
vein. Patent flow demonstrated in the right brachiocephalic vein and
SVC.

## 2023-10-15 ENCOUNTER — Telehealth: Payer: Self-pay | Admitting: Internal Medicine

## 2023-10-15 ENCOUNTER — Telehealth: Payer: Self-pay

## 2023-10-15 NOTE — Telephone Encounter (Signed)
 Quindara from [facility] called regarding an order for a transfer bed for the patient. She said they do not have transfer beds, but they do have semi-electric hospital beds. Best callback for [name] is [phone]

## 2023-10-15 NOTE — Telephone Encounter (Signed)
 Copied from CRM [id number]. Topic: General - Other >> Oct 15, 2023  3:29 PM [place] J wrote: Reason for CRM: Nurse from [facility] calling to let Dr. Yetta Barre know that patient is not taking several medications that are on his medication list:  amLODipine (NORVASC) 10 MG carvedilol (COREG) 25 MG  hydrALAZINE (APRESOLINE) 25 MG sevelamer carbonate (RENVELA) 800 MG *Patient takes very sparingly ,sometimes 1x daily every couple of days.*

## 2023-10-16 NOTE — Telephone Encounter (Signed)
 Will this bed do fine for the patient ?

## 2023-10-16 NOTE — Telephone Encounter (Signed)
 FYI. Is there anything you want me to advise them about this ?

## 2023-10-30 ENCOUNTER — Inpatient Hospital Stay: Payer: Medicare Other | Admitting: Internal Medicine

## 2023-11-19 ENCOUNTER — Inpatient Hospital Stay (HOSPITAL_COMMUNITY)
Admission: EM | Admit: 2023-11-19 | Discharge: 2023-11-23 | DRG: 871 | Disposition: A | Discharge status: Home / Self Care | Attending: Internal Medicine | Admitting: Internal Medicine

## 2023-11-19 DIAGNOSIS — D649 Anemia, unspecified: Secondary | ICD-10-CM | POA: Diagnosis present

## 2023-11-19 DIAGNOSIS — I120 Hypertensive chronic kidney disease with stage 5 chronic kidney disease or end stage renal disease: Secondary | ICD-10-CM | POA: Diagnosis present

## 2023-11-19 DIAGNOSIS — J90 Pleural effusion, not elsewhere classified: Secondary | ICD-10-CM | POA: Diagnosis present

## 2023-11-19 DIAGNOSIS — Z8679 Personal history of other diseases of the circulatory system: Secondary | ICD-10-CM

## 2023-11-19 DIAGNOSIS — I10 Essential (primary) hypertension: Secondary | ICD-10-CM | POA: Diagnosis present

## 2023-11-19 DIAGNOSIS — N186 End stage renal disease: Secondary | ICD-10-CM

## 2023-11-19 DIAGNOSIS — E875 Hyperkalemia: Secondary | ICD-10-CM | POA: Diagnosis not present

## 2023-11-19 DIAGNOSIS — Z860100 Personal history of colon polyps, unspecified: Secondary | ICD-10-CM

## 2023-11-19 DIAGNOSIS — T8612 Kidney transplant failure: Secondary | ICD-10-CM | POA: Diagnosis present

## 2023-11-19 DIAGNOSIS — R531 Weakness: Secondary | ICD-10-CM

## 2023-11-19 DIAGNOSIS — M898X9 Other specified disorders of bone, unspecified site: Secondary | ICD-10-CM | POA: Diagnosis present

## 2023-11-19 DIAGNOSIS — A419 Sepsis, unspecified organism: Principal | ICD-10-CM | POA: Diagnosis present

## 2023-11-19 DIAGNOSIS — J189 Pneumonia, unspecified organism: Secondary | ICD-10-CM | POA: Diagnosis present

## 2023-11-19 DIAGNOSIS — E785 Hyperlipidemia, unspecified: Secondary | ICD-10-CM | POA: Diagnosis present

## 2023-11-19 DIAGNOSIS — Z1152 Encounter for screening for COVID-19: Secondary | ICD-10-CM

## 2023-11-19 DIAGNOSIS — Y830 Surgical operation with transplant of whole organ as the cause of abnormal reaction of the patient, or of later complication, without mention of misadventure at the time of the procedure: Secondary | ICD-10-CM | POA: Diagnosis present

## 2023-11-19 DIAGNOSIS — G40909 Epilepsy, unspecified, not intractable, without status epilepticus: Secondary | ICD-10-CM

## 2023-11-19 DIAGNOSIS — Z79899 Other long term (current) drug therapy: Secondary | ICD-10-CM

## 2023-11-19 DIAGNOSIS — D631 Anemia in chronic kidney disease: Secondary | ICD-10-CM | POA: Diagnosis present

## 2023-11-19 DIAGNOSIS — E1122 Type 2 diabetes mellitus with diabetic chronic kidney disease: Secondary | ICD-10-CM | POA: Diagnosis present

## 2023-11-19 DIAGNOSIS — Z992 Dependence on renal dialysis: Secondary | ICD-10-CM

## 2023-11-19 DIAGNOSIS — N2581 Secondary hyperparathyroidism of renal origin: Secondary | ICD-10-CM | POA: Diagnosis present

## 2023-11-19 DIAGNOSIS — Z833 Family history of diabetes mellitus: Secondary | ICD-10-CM

## 2023-11-20 ENCOUNTER — Emergency Department (HOSPITAL_COMMUNITY)

## 2023-11-20 ENCOUNTER — Inpatient Hospital Stay (HOSPITAL_COMMUNITY)

## 2023-11-20 ENCOUNTER — Other Ambulatory Visit: Payer: Self-pay

## 2023-11-20 ENCOUNTER — Encounter (HOSPITAL_COMMUNITY): Payer: Self-pay | Admitting: Internal Medicine

## 2023-11-20 DIAGNOSIS — J690 Pneumonitis due to inhalation of food and vomit: Secondary | ICD-10-CM | POA: Diagnosis not present

## 2023-11-20 DIAGNOSIS — Z992 Dependence on renal dialysis: Secondary | ICD-10-CM | POA: Diagnosis not present

## 2023-11-20 DIAGNOSIS — E875 Hyperkalemia: Secondary | ICD-10-CM | POA: Diagnosis present

## 2023-11-20 DIAGNOSIS — J90 Pleural effusion, not elsewhere classified: Secondary | ICD-10-CM | POA: Diagnosis present

## 2023-11-20 DIAGNOSIS — R531 Weakness: Secondary | ICD-10-CM | POA: Diagnosis not present

## 2023-11-20 DIAGNOSIS — Z860100 Personal history of colon polyps, unspecified: Secondary | ICD-10-CM | POA: Diagnosis not present

## 2023-11-20 DIAGNOSIS — Z8679 Personal history of other diseases of the circulatory system: Secondary | ICD-10-CM

## 2023-11-20 DIAGNOSIS — A419 Sepsis, unspecified organism: Secondary | ICD-10-CM | POA: Diagnosis present

## 2023-11-20 DIAGNOSIS — D631 Anemia in chronic kidney disease: Secondary | ICD-10-CM | POA: Diagnosis present

## 2023-11-20 DIAGNOSIS — M898X9 Other specified disorders of bone, unspecified site: Secondary | ICD-10-CM | POA: Diagnosis present

## 2023-11-20 DIAGNOSIS — G40909 Epilepsy, unspecified, not intractable, without status epilepticus: Secondary | ICD-10-CM | POA: Diagnosis present

## 2023-11-20 DIAGNOSIS — T8612 Kidney transplant failure: Secondary | ICD-10-CM | POA: Diagnosis present

## 2023-11-20 DIAGNOSIS — N186 End stage renal disease: Secondary | ICD-10-CM | POA: Diagnosis present

## 2023-11-20 DIAGNOSIS — Z79899 Other long term (current) drug therapy: Secondary | ICD-10-CM | POA: Diagnosis not present

## 2023-11-20 DIAGNOSIS — Z1152 Encounter for screening for COVID-19: Secondary | ICD-10-CM | POA: Diagnosis not present

## 2023-11-20 DIAGNOSIS — E1122 Type 2 diabetes mellitus with diabetic chronic kidney disease: Secondary | ICD-10-CM | POA: Diagnosis present

## 2023-11-20 DIAGNOSIS — I10 Essential (primary) hypertension: Secondary | ICD-10-CM

## 2023-11-20 DIAGNOSIS — R6520 Severe sepsis without septic shock: Secondary | ICD-10-CM | POA: Diagnosis not present

## 2023-11-20 DIAGNOSIS — Z833 Family history of diabetes mellitus: Secondary | ICD-10-CM | POA: Diagnosis not present

## 2023-11-20 DIAGNOSIS — R509 Fever, unspecified: Secondary | ICD-10-CM

## 2023-11-20 DIAGNOSIS — D649 Anemia, unspecified: Secondary | ICD-10-CM

## 2023-11-20 DIAGNOSIS — N2581 Secondary hyperparathyroidism of renal origin: Secondary | ICD-10-CM | POA: Diagnosis present

## 2023-11-20 DIAGNOSIS — J189 Pneumonia, unspecified organism: Secondary | ICD-10-CM | POA: Diagnosis present

## 2023-11-20 DIAGNOSIS — Y830 Surgical operation with transplant of whole organ as the cause of abnormal reaction of the patient, or of later complication, without mention of misadventure at the time of the procedure: Secondary | ICD-10-CM | POA: Diagnosis present

## 2023-11-20 DIAGNOSIS — E785 Hyperlipidemia, unspecified: Secondary | ICD-10-CM | POA: Diagnosis present

## 2023-11-20 DIAGNOSIS — I120 Hypertensive chronic kidney disease with stage 5 chronic kidney disease or end stage renal disease: Secondary | ICD-10-CM | POA: Diagnosis present

## 2023-11-20 LAB — CBC WITH DIFFERENTIAL/PLATELET
Abs Immature Granulocytes: 0.05 10*3/uL (ref 0.00–0.07)
Basophils Absolute: 0 10*3/uL (ref 0.0–0.1)
Basophils Relative: 0 %
Eosinophils Absolute: 0 10*3/uL (ref 0.0–0.5)
Eosinophils Relative: 0 %
HCT: 32.3 % — ABNORMAL LOW (ref 39.0–52.0)
Hemoglobin: 9.9 g/dL — ABNORMAL LOW (ref 13.0–17.0)
Immature Granulocytes: 1 %
Lymphocytes Relative: 8 %
Lymphs Abs: 0.8 10*3/uL (ref 0.7–4.0)
MCH: 27.5 pg (ref 26.0–34.0)
MCHC: 30.7 g/dL (ref 30.0–36.0)
MCV: 89.7 fL (ref 80.0–100.0)
Monocytes Absolute: 0.8 10*3/uL (ref 0.1–1.0)
Monocytes Relative: 9 %
Neutro Abs: 7.3 10*3/uL (ref 1.7–7.7)
Neutrophils Relative %: 82 %
Platelets: 171 10*3/uL (ref 150–400)
RBC: 3.6 MIL/uL — ABNORMAL LOW (ref 4.22–5.81)
RDW: 16.8 % — ABNORMAL HIGH (ref 11.5–15.5)
WBC: 8.9 10*3/uL (ref 4.0–10.5)
nRBC: 0 % (ref 0.0–0.2)

## 2023-11-20 LAB — COMPREHENSIVE METABOLIC PANEL WITH GFR
ALT: 16 U/L (ref 0–44)
AST: 34 U/L (ref 15–41)
Albumin: 3 g/dL — ABNORMAL LOW (ref 3.5–5.0)
Alkaline Phosphatase: 83 U/L (ref 38–126)
Anion gap: 14 (ref 5–15)
BUN: 60 mg/dL — ABNORMAL HIGH (ref 8–23)
CO2: 28 mmol/L (ref 22–32)
Calcium: 8.8 mg/dL — ABNORMAL LOW (ref 8.9–10.3)
Chloride: 93 mmol/L — ABNORMAL LOW (ref 98–111)
Creatinine, Ser: 7.64 mg/dL — ABNORMAL HIGH (ref 0.61–1.24)
GFR, Estimated: 7 mL/min — ABNORMAL LOW (ref >60)
Glucose, Bld: 108 mg/dL — ABNORMAL HIGH (ref 70–99)
Potassium: 5.4 mmol/L — ABNORMAL HIGH (ref 3.5–5.1)
Sodium: 135 mmol/L (ref 135–145)
Total Bilirubin: 0.9 mg/dL (ref 0.0–1.2)
Total Protein: 7.9 g/dL (ref 6.5–8.1)

## 2023-11-20 LAB — PROTIME-INR
INR: 1.2 (ref 0.8–1.2)
Prothrombin Time: 15.6 s — ABNORMAL HIGH (ref 11.4–15.2)

## 2023-11-20 LAB — RESP PANEL BY RT-PCR (RSV, FLU A&B, COVID)  RVPGX2
Influenza A by PCR: NEGATIVE
Influenza B by PCR: NEGATIVE
Resp Syncytial Virus by PCR: NEGATIVE
SARS Coronavirus 2 by RT PCR: NEGATIVE

## 2023-11-20 LAB — HEPATITIS B SURFACE ANTIGEN: Hepatitis B Surface Ag: NONREACTIVE

## 2023-11-20 LAB — PROCALCITONIN: Procalcitonin: 6.21 ng/mL

## 2023-11-20 LAB — MRSA NEXT GEN BY PCR, NASAL: MRSA by PCR Next Gen: NOT DETECTED

## 2023-11-20 LAB — HIV ANTIBODY (ROUTINE TESTING W REFLEX): HIV Screen 4th Generation wRfx: NONREACTIVE

## 2023-11-20 LAB — I-STAT CG4 LACTIC ACID, ED: Lactic Acid, Venous: 1.4 mmol/L (ref 0.5–1.9)

## 2023-11-20 MED ORDER — CALCITRIOL 0.5 MCG PO CAPS
0.5000 ug | ORAL_CAPSULE | ORAL | Status: DC
  Administered 2023-11-20: 0.5 ug via ORAL
  Filled 2023-11-20 (×2): qty 1

## 2023-11-20 MED ORDER — VANCOMYCIN HCL IN DEXTROSE 1-5 GM/200ML-% IV SOLN: 1000.0000 mg | Freq: Once | INTRAVENOUS | Status: DC

## 2023-11-20 MED ORDER — HEPARIN SODIUM (PORCINE) 5000 UNIT/ML IJ SOLN
5000.0000 [IU] | Freq: Three times a day (TID) | INTRAMUSCULAR | Status: DC
  Administered 2023-11-20 – 2023-11-23 (×10): 5000 [IU] via SUBCUTANEOUS
  Filled 2023-11-20 (×10): qty 1

## 2023-11-20 MED ORDER — ACETAMINOPHEN 650 MG RE SUPP: 650.0000 mg | Freq: Four times a day (QID) | RECTAL | Status: DC | PRN

## 2023-11-20 MED ORDER — VANCOMYCIN HCL 1500 MG/300ML IV SOLN
1500.0000 mg | Freq: Once | INTRAVENOUS | Status: AC
  Administered 2023-11-20: 1500 mg via INTRAVENOUS
  Filled 2023-11-20: qty 300

## 2023-11-20 MED ORDER — VANCOMYCIN HCL 750 MG/150ML IV SOLN
750.0000 mg | Freq: Once | INTRAVENOUS | Status: DC
  Filled 2023-11-20: qty 150

## 2023-11-20 MED ORDER — ACETAMINOPHEN 500 MG PO TABS
1000.0000 mg | ORAL_TABLET | Freq: Once | ORAL | Status: AC
  Administered 2023-11-20: 1000 mg via ORAL
  Filled 2023-11-20: qty 2

## 2023-11-20 MED ORDER — SODIUM CHLORIDE 0.9 % IV SOLN
2.0000 g | Freq: Once | INTRAVENOUS | Status: AC
  Administered 2023-11-20: 2 g via INTRAVENOUS
  Filled 2023-11-20: qty 12.5

## 2023-11-20 MED ORDER — CARVEDILOL 25 MG PO TABS
25.0000 mg | ORAL_TABLET | Freq: Two times a day (BID) | ORAL | Status: DC
  Administered 2023-11-20 – 2023-11-22 (×6): 25 mg via ORAL
  Filled 2023-11-20 (×6): qty 1

## 2023-11-20 MED ORDER — AMLODIPINE BESYLATE 10 MG PO TABS
10.0000 mg | ORAL_TABLET | Freq: Every day | ORAL | Status: DC
  Administered 2023-11-20 – 2023-11-22 (×3): 10 mg via ORAL
  Filled 2023-11-20 (×3): qty 1

## 2023-11-20 MED ORDER — SEVELAMER CARBONATE 800 MG PO TABS
1600.0000 mg | ORAL_TABLET | Freq: Three times a day (TID) | ORAL | Status: DC
  Administered 2023-11-20 – 2023-11-23 (×9): 1600 mg via ORAL
  Filled 2023-11-20 (×9): qty 2

## 2023-11-20 MED ORDER — HYDROCOD POLI-CHLORPHE POLI ER 10-8 MG/5ML PO SUER
5.0000 mL | Freq: Two times a day (BID) | ORAL | Status: DC | PRN
  Administered 2023-11-20 – 2023-11-22 (×2): 5 mL via ORAL
  Filled 2023-11-20 (×2): qty 5

## 2023-11-20 MED ORDER — VANCOMYCIN HCL 750 MG/150ML IV SOLN
750.0000 mg | INTRAVENOUS | Status: DC
  Administered 2023-11-21 – 2023-11-23 (×2): 750 mg via INTRAVENOUS
  Filled 2023-11-20 (×2): qty 150

## 2023-11-20 MED ORDER — METRONIDAZOLE 500 MG/100ML IV SOLN
500.0000 mg | Freq: Once | INTRAVENOUS | Status: AC
  Administered 2023-11-20: 500 mg via INTRAVENOUS
  Filled 2023-11-20: qty 100

## 2023-11-20 MED ORDER — HYDRALAZINE HCL 25 MG PO TABS
25.0000 mg | ORAL_TABLET | Freq: Two times a day (BID) | ORAL | Status: DC
  Administered 2023-11-20 – 2023-11-22 (×6): 25 mg via ORAL
  Filled 2023-11-20 (×6): qty 1

## 2023-11-20 MED ORDER — ALBUTEROL SULFATE (2.5 MG/3ML) 0.083% IN NEBU
2.5000 mg | INHALATION_SOLUTION | Freq: Once | RESPIRATORY_TRACT | Status: AC
  Administered 2023-11-20: 2.5 mg via RESPIRATORY_TRACT
  Filled 2023-11-20: qty 3

## 2023-11-20 MED ORDER — CINACALCET HCL 30 MG PO TABS
60.0000 mg | ORAL_TABLET | ORAL | Status: DC
  Administered 2023-11-20: 60 mg via ORAL
  Filled 2023-11-20 (×2): qty 2

## 2023-11-20 MED ORDER — SODIUM CHLORIDE 0.9 % IV SOLN
1.0000 g | INTRAVENOUS | Status: DC
  Administered 2023-11-21 – 2023-11-23 (×3): 1 g via INTRAVENOUS
  Filled 2023-11-20 (×5): qty 10

## 2023-11-20 MED ORDER — ALBUTEROL SULFATE (2.5 MG/3ML) 0.083% IN NEBU: 2.5000 mg | INHALATION_SOLUTION | RESPIRATORY_TRACT | Status: DC | PRN

## 2023-11-20 MED ORDER — SODIUM BICARBONATE 8.4 % IV SOLN
50.0000 meq | Freq: Once | INTRAVENOUS | Status: AC
  Administered 2023-11-20: 50 meq via INTRAVENOUS
  Filled 2023-11-20: qty 50

## 2023-11-20 MED ORDER — SODIUM CHLORIDE 0.9% FLUSH
3.0000 mL | Freq: Two times a day (BID) | INTRAVENOUS | Status: DC
  Administered 2023-11-20 – 2023-11-22 (×6): 3 mL via INTRAVENOUS

## 2023-11-20 MED ORDER — ACETAMINOPHEN 325 MG PO TABS
650.0000 mg | ORAL_TABLET | Freq: Four times a day (QID) | ORAL | Status: DC | PRN
  Administered 2023-11-22 (×2): 650 mg via ORAL
  Filled 2023-11-20 (×3): qty 2

## 2023-11-20 MED ORDER — CHLORHEXIDINE GLUCONATE CLOTH 2 % EX PADS
6.0000 | MEDICATED_PAD | Freq: Every day | CUTANEOUS | Status: DC
  Administered 2023-11-21 – 2023-11-23 (×3): 6 via TOPICAL

## 2023-11-20 NOTE — Progress Notes (Addendum)
 Pharmacy Antibiotic Note  [name] is a 73 y.o. male admitted on 11/19/2023 presenting with weakness, concern for sepsis with hx of staph epidermidis bacteremia recently.  Pharmacy has been consulted for vancomycin and cefepime dosing.  ESRD-HD usually MWF  Plan: Cefepime 1g IV q 24h Vancomycin 750 mg IV qHD Monitor HD schedule, Cx and clinical progression to narrow Vancomycin random level as needed  Height: 5\' 11"  (180.3 cm) Weight: 73 kg (160 lb 15 oz) IBW/kg (Calculated) : 75.3  Temp (24hrs), Avg:99.8 F (37.7 C), Min:98.3 F (36.8 C), Max:102 F (38.9 C)  Recent Labs  Lab 11/20/23 0029 11/20/23 0036  WBC 8.9  --   CREATININE 7.64*  --   LATICACIDVEN  --  1.4    Estimated Creatinine Clearance: 9.3 mL/min (A) (by C-G formula based on SCr of 7.64 mg/dL (H)).    No Known Allergies  Daylene Posey, PharmD, [facility] Clinical Pharmacist ED Pharmacist Phone # [phone] 11/20/2023 7:10 PM

## 2023-11-20 NOTE — Progress Notes (Signed)
 Pt returned from CT. AOx4, bed in lowest position and floor mat in place.

## 2023-11-20 NOTE — ED Triage Notes (Signed)
 Pt BIB [facility] from home. Pt c/o generalized weakness ongoing for a couple weeks; baseline ambulatory independently. Dialysis 4/10, did receive full treatment. Denies CP/SOB

## 2023-11-20 NOTE — Progress Notes (Signed)
 Stormy with phlebotomy called and stated that she was unable to collect Hep B lab after two attempts. She said she would ask another tech to come and collect the lab.

## 2023-11-20 NOTE — H&P (Signed)
 History and Physical    Patient: [name] ZOX:[id number] DOB: 1951/03/03 DOA: 11/19/2023 DOS: the patient was seen and examined on 11/20/2023 PCP: Etta Grandchild, MD  Patient coming from: Home via EMS  Chief Complaint:  Chief Complaint  Patient presents with   Weakness   HPI: [name] is a 73 y.o. male with medical history significant of hypertension, hyperlipidemia, ESRD on HD, DM type II, seizure disorder, and anemia of chronic disease with reports of weakness.  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 He completed the course of antibiotics last week.     Yesterday experienced an acute onset of inability to move and generalized weakness that began last night. He was unable to get up or walk.  He had not noticed any fevers. No headaches, chest discomfort, trouble breathing, vomiting, or leg swelling. He reports a little coughing at home.  A follow-up appointment with neurology is scheduled for the 22nd of this month.  He is on a dialysis schedule of Monday, Wednesday, and Friday, with the last session completed on Wednesday, two days ago. No issues were reported during the last dialysis session.   In the emergency department patient was noted to be febrile up to 102 F with tachypnea meeting SIRS criteria.  Labs hemoglobin 9.9, potassium 5.4 BUN 60, creatinine 1.64, INR 1.2, and lactic acid 1.4.  Chest x-ray noted possible developing retrocardiac airspace opacity, cardiomegaly with pericardial effusion not excluded, and bilateral trace pleural effusions.  Influenza, COVID-19, RSV screening were negative.  Blood cultures were obtained.  Patient was given acetaminophen 1000 mg p.o., albuterol neb, sodium bicarb, vancomycin,  metronidazole, and cefepime. Review of Systems: As mentioned in the history of present illness. All other systems reviewed and are negative. Past Medical History:  Diagnosis Date   Anemia    Anemia    Benign colon polyp 08/11/2010   Clot    STATED TOLD IN [facility] CLOT TO LEFT SHOULDER. 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   HTN (hypertension)    Hyperlipidemia    Seizures (HCC)    Past Surgical History:  Procedure Laterality Date   AV FISTULA PLACEMENT  01/19/2012   Procedure: ARTERIOVENOUS (AV) FISTULA CREATION;  Surgeon: Fransisco Hertz, MD;  Location: [facility] OR;  Service: Vascular;  Laterality: Right;  Right Brachio-cephalic arteriovenous fistula   AV FISTULA PLACEMENT Right 07/18/2021   Procedure: INSERTION OF RIGHT UPPER EXTREMITY ARTERIOVENOUS (AV) GORE-TEX GRAFT;  Surgeon: Leonie Douglas, MD;  Location: [facility] OR;  Service: Vascular;  Laterality: Right;  PERIPHERAL NERVE BLOCK   AV FISTULA PLACEMENT, RADIOCEPHALIC  12/31/10   Right arm   INSERTION OF DIALYSIS CATHETER  01/19/2012   Procedure: INSERTION OF DIALYSIS CATHETER;  Surgeon: Fransisco Hertz, MD;  Location: [facility] OR;  Service: Vascular;  Laterality: N/A;  right internal jugular vein   TRANSESOPHAGEAL ECHOCARDIOGRAM (CATH LAB) N/A 09/23/2023   Procedure: TRANSESOPHAGEAL ECHOCARDIOGRAM;  Surgeon: Christell Constant, MD;  Location: [facility] INVASIVE CV LAB;  Service: Cardiovascular;  Laterality: N/A;   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   UPPER EXTREMITY VENOGRAPHY Right 06/11/2021   Procedure: UPPER EXTREMITY VENOGRAPHY;  Surgeon: Nada Libman, MD;  Location: [facility] INVASIVE CV LAB;  Service: Cardiovascular;  Laterality: Right;   VENOGRAM Left 05/30/2021   Procedure: DIAGNOSTIC CENTRAL VENOGRAM;  Surgeon: Victorino Sparrow, MD;  Location: [facility] OR;  Service:  Vascular;  Laterality: Left;   Social History:  reports that he has never smoked. He has never used smokeless tobacco. He reports that he does not drink alcohol and does not use drugs.  No Known Allergies  Family History  Problem Relation Age of Onset   Diabetes Father    Colon cancer Neg Hx    Esophageal cancer Neg Hx    Stomach cancer Neg Hx    Rectal cancer Neg Hx     Prior to Admission medications   Medication Sig Start Date End Date Taking? Authorizing Provider  acetaminophen (TYLENOL) 500 MG tablet Take 1,000 mg by mouth every 8 (eight) hours as needed for moderate pain.   Yes [provider]  amLODipine (NORVASC) 10 MG tablet Take 10 mg by mouth daily. 06/30/23  Yes [provider]  calcitRIOL (ROCALTROL) 0.5 MCG capsule Take 0.5 mcg by mouth See admin instructions. Take one tablet by mouth three times a week on Monday, Wednesday and Fridays per wife 04/20/23  Yes [provider]  carvedilol (COREG) 25 MG tablet Take 25 mg by mouth 2 (two) times daily with a meal.   Yes [provider]  hydrALAZINE (APRESOLINE) 25 MG tablet Take 1 tablet (25 mg total) by mouth 2 (two) times daily. 09/24/23  Yes Ghimire, Werner Lean, MD  sevelamer carbonate (RENVELA) 800 MG tablet Take 1,600 mg by mouth 3 (three) times daily with meals. 04/01/21  Yes [provider]  tiZANidine (ZANAFLEX) 2 MG tablet Take 2 mg by mouth every 8 (eight) hours as needed for muscle spasms. 09/15/23  Yes [provider]  clonazePAM (KLONOPIN) 2 MG disintegrating tablet Take 1 tablet (2 mg total) by mouth daily. Patient not taking: Reported on 09/18/2023 09/13/23   [name], MD  levETIRAcetam (KEPPRA) 500 MG tablet Take 1 tablet (500 mg total) by mouth daily. Patient not taking: Reported on 11/20/2023 09/24/23 10/24/23  Maretta Bees, MD    Physical Exam: Vitals:   11/20/23 0715 11/20/23 0730 11/20/23 0745 11/20/23 0800  BP: (!) 156/75 (!) 162/71 (!) 156/77 (!)  161/72  Pulse: 85 82 88 81  Resp: 12 14 (!) 22 14  Temp:      TempSrc:      SpO2: 100% 100% 98% 94%  Weight:      Height:       Constitutional: Elderly male who appears chronically ill. Eyes: PERRL, lids and conjunctivae normal ENMT: Mucous membranes are moist. Posterior pharynx clear of any exudate or lesions.Normal dentition.  Neck: normal, supple  Respiratory: Clear to auscultation without significant wheezes or rhonchi appreciated.  Patient able to talk in complete sentences. Cardiovascular: Regular rate and rhythm, no murmurs / rubs / gallops. No extremity edema. 2+ pedal pulses.  Right upper extremity fistula in place with palpable thrill. Abdomen: no tenderness, no masses palpated.  Bowel sounds positive.  Musculoskeletal: no clubbing / cyanosis. No joint deformity upper and lower extremities. Good ROM, no contractures. Normal muscle tone.  Skin: no rashes, lesions, ulcers. No induration Neurologic: CN 2-12 grossly intact.  Global weakness appreciated but able to move all extremities. Psychiatric: Normal judgment and insight. Alert and oriented x  3. Normal mood.   Data Reviewed:  EKG reveals sinus rhythm at 94 bpm with Assessment and Plan:  Fever SIRS/sepsis secondary to possible community-acquired pneumonia Patient presents with fever up to 102 F with weakness.  He was noted to be tachypneic meeting SIRS criteria.  Chest x-ray concerning for possible developing retrocardiac airspace opacity.  Blood cultures were obtained.  Patient had initially been started on empiric antibiotics of vancomycin, cefepime, and metronidazole.  Of note patient had just recently been hospitalized completing 6 weeks of antibiotics for staph epididymis bacteremia. - Admit to a telemetry bed - Follow-up blood cultures - Procalcitonin - Check CT scan of the chest - Continue empiric antibiotics of vancomycin and cefepime - Tylenol as needed for fever  Hyperkalemia ESRD on HD Patient last dialyzed  on 4/9.  Normally on MWF schedule.  On physical exam patient appears to be fairly euvolemic.  Labs revealed potassium 5.4, CO2 28, BUN 60, creatinine 7.64. -Continue current medication regimen -Nephrology consulted for possible need of hemodialysis during stay  Generalized weakness Patient reports having generalized weakness for which she was unable to ambulate.  Global weakness noted on physical exam. - Neurochecks - Check CT scan of the head - PT/OT to evaluate and treat  Essential hypertension Initial blood pressures elevated up to 164/73. - Continue amlodipine, Coreg, and hydralazine  History of endocarditis Patient had been been found to have Staph epidermidis bacteremia with concern for vegetation on TEE during hospitalization from 2/7-2/13/2025.  He was treated with IV Ancef until 10/29/23.  Possible seizure disorder During hospitalization 1/27-2/2 there had been concern for possible seizure activity.  EEG showed no evidence of seizures and MRI brain showed no focus for seizures.  He was discharged on Keppra 500 mg daily to be continued until follow-up, but follow-up appointment had not been scheduled until the 20th of this month and no refills were given.  - Resume Keppra  - Encouraged patient to keep follow-up appointment with neurology on the 20th of this month.  Anemia of chronic disease Hemoglobin 9.9 which appears around patient's baseline. -Continue to monitor    DVT prophylaxis: Heparin Advance Care Planning:   Code Status: Full Code    Consults: Nephrology  Family Communication: Wife updated at the bedside  Severity of Illness: The appropriate patient status for this patient is INPATIENT. Inpatient status is judged to be reasonable and necessary in order to provide the required intensity of service to ensure the patient's safety. The patient's presenting symptoms, physical exam findings, and initial radiographic and laboratory data in the context of their chronic  comorbidities is felt to place them at high risk for further clinical deterioration. Furthermore, it is not anticipated that the patient will be medically stable for discharge from the hospital within 2 midnights of admission.   * I certify that at the point of admission it is my clinical judgment that the patient will require inpatient hospital care spanning beyond 2 midnights from the point of admission due to high intensity of service, high risk for further deterioration and high frequency of surveillance required.*  Author: Clydie Braun, MD 11/20/2023 8:21 AM  For on call review [email or web address].

## 2023-11-20 NOTE — Progress Notes (Signed)
 Pt transported to CT ?

## 2023-11-20 NOTE — Progress Notes (Signed)
 Pt is AOx4. LOC charted by CN, Trula Slade, RN initiated yellow MEWS. Informed CN that pt is AOx4 but he seems to be agitated and irritated so he tends to answer questions slowly. Pt's spouse at the bedside answers questions for him. No yellow MEWS protocol necessary. Per CN changed LOC from alert to voice to "Alert."

## 2023-11-20 NOTE — Progress Notes (Signed)
 Pt returned from CT. Signed informed consent for hemodialysis.

## 2023-11-20 NOTE — Sepsis Progress Note (Signed)
 Elink monitoring for the code sepsis protocol.

## 2023-11-20 NOTE — ED Provider Notes (Signed)
  EMERGENCY DEPARTMENT AT [facility] Provider Note   CSN: [id number] Arrival date & time: 11/19/23  2357     History  Chief Complaint  Patient presents with   Weakness    [name] is a 73 y.o. male.  The history is provided by the patient and the EMS personnel. The history is limited by the condition of the patient.  Weakness Severity:  Severe Onset quality:  Gradual Timing:  Constant Progression:  Unchanged Chronicity:  New Context: not alcohol use   Relieved by:  Nothing Worsened by:  Nothing Ineffective treatments:  None tried Associated symptoms: fever   Associated symptoms: no vomiting   Risk factors: no anemia   Patient with ESRD on 7 weeks of antibiotics that he completed yesterday.      Past Medical History:  Diagnosis Date   Anemia    Anemia    Benign colon polyp 08/11/2010   Clot    STATED TOLD IN [facility] CLOT TO LEFT SHOULDER. 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 Authorizing Provider  acetaminophen (TYLENOL) 500 MG tablet Take 1,000 mg by mouth every 8 (eight) hours as needed for moderate pain.   Yes [provider]  amLODipine (NORVASC) 10 MG tablet Take 10 mg by mouth daily. 06/30/23  Yes [provider]  calcitRIOL (ROCALTROL) 0.5 MCG capsule Take 0.5 mcg by mouth See admin instructions. Take one tablet by mouth three times a week on Monday, Wednesday and Fridays per wife 04/20/23  Yes [provider]  carvedilol (COREG) 25 MG tablet Take 25 mg by mouth 2 (two) times daily with a meal.   Yes [provider]  hydrALAZINE (APRESOLINE) 25 MG tablet  Take 1 tablet (25 mg total) by mouth 2 (two) times daily. 09/24/23  Yes Ghimire, Werner Lean, MD  sevelamer carbonate (RENVELA) 800 MG tablet Take 1,600 mg by mouth 3 (three) times daily with meals. 04/01/21  Yes [provider]  tiZANidine (ZANAFLEX) 2 MG tablet Take 2 mg by mouth every 8 (eight) hours as needed for muscle spasms. 09/15/23  Yes [provider]  clonazePAM (KLONOPIN) 2 MG disintegrating tablet Take 1 tablet (2 mg total) by mouth daily. Patient not taking: Reported on 09/18/2023 09/13/23   [name], MD  levETIRAcetam (KEPPRA) 500 MG tablet Take 1 tablet (500 mg total) by mouth daily. Patient not taking: Reported on 11/20/2023 09/24/23 10/24/23  Maretta Bees, MD      Allergies    Patient has no known allergies.    Review of Systems   Review of Systems  Unable to perform ROS: Acuity of condition  Constitutional:  Positive for fatigue and fever.  HENT:  Negative for facial swelling.   Respiratory:  Negative for stridor.   Gastrointestinal:  Negative for vomiting.  Neurological:  Positive for weakness.  All other systems reviewed and are negative.   Physical Exam Updated Vital Signs BP 128/69   Pulse 81   Temp 98.9 F (37.2 C) (Oral)   Resp 19   Ht 5\' 11"  (  1.803 m)   Wt 73 kg   SpO2 100%   BMI 22.45 kg/m  Physical Exam Vitals and nursing note reviewed.  Constitutional:      General: He is not in acute distress.    Appearance: Normal appearance. He is well-developed. He is not diaphoretic.  HENT:     Head: Normocephalic and atraumatic.     Nose: Nose normal.  Eyes:     Conjunctiva/sclera: Conjunctivae normal.     Pupils: Pupils are equal, round, and reactive to light.  Cardiovascular:     Rate and Rhythm: Regular rhythm. Tachycardia present.     Pulses: Normal pulses.     Heart sounds: Normal heart sounds.  Pulmonary:     Effort: Pulmonary effort is normal.     Breath sounds: Rhonchi present. No wheezing or rales.  Abdominal:      General: Bowel sounds are normal.     Palpations: Abdomen is soft.     Tenderness: There is no abdominal tenderness. There is no guarding or rebound.  Musculoskeletal:        General: Normal range of motion.     Cervical back: Normal range of motion and neck supple.  Skin:    General: Skin is warm and dry.     Capillary Refill: Capillary refill takes less than 2 seconds.  Neurological:     Mental Status: He is alert.     Deep Tendon Reflexes: Reflexes normal.  Psychiatric:        Mood and Affect: Mood normal.     ED Results / Procedures / Treatments   Labs (all labs ordered are listed, but only abnormal results are displayed) Results for orders placed or performed during the hospital encounter of 11/19/23  Comprehensive metabolic panel   Collection Time: 11/20/23 12:29 AM  Result Value Ref Range   Sodium 135 135 - 145 mmol/L   Potassium 5.4 (H) 3.5 - 5.1 mmol/L   Chloride 93 (L) 98 - 111 mmol/L   CO2 28 22 - 32 mmol/L   Glucose, Bld 108 (H) 70 - 99 mg/dL   BUN 60 (H) 8 - 23 mg/dL   Creatinine, Ser 1.61 (H) 0.61 - 1.24 mg/dL   Calcium 8.8 (L) 8.9 - 10.3 mg/dL   Total Protein 7.9 6.5 - 8.1 g/dL   Albumin 3.0 (L) 3.5 - 5.0 g/dL   AST 34 15 - 41 U/L   ALT 16 0 - 44 U/L   Alkaline Phosphatase 83 38 - 126 U/L   Total Bilirubin 0.9 0.0 - 1.2 mg/dL   GFR, Estimated 7 (L) >60 mL/min   Anion gap 14 5 - 15  CBC with Differential   Collection Time: 11/20/23 12:29 AM  Result Value Ref Range   WBC 8.9 4.0 - 10.5 K/uL   RBC 3.60 (L) 4.22 - 5.81 MIL/uL   Hemoglobin 9.9 (L) 13.0 - 17.0 g/dL   HCT 09.6 (L) 04.5 - 40.9 %   MCV 89.7 80.0 - 100.0 fL   MCH 27.5 26.0 - 34.0 pg   MCHC 30.7 30.0 - 36.0 g/dL   RDW 81.1 (H) 91.4 - 78.2 %   Platelets 171 150 - 400 K/uL   nRBC 0.0 0.0 - 0.2 %   Neutrophils Relative % 82 %   Neutro Abs 7.3 1.7 - 7.7 K/uL   Lymphocytes Relative 8 %   Lymphs Abs 0.8 0.7 - 4.0 K/uL   Monocytes Relative 9 %   Monocytes Absolute 0.8 0.1 - 1.0  K/uL    Eosinophils Relative 0 %   Eosinophils Absolute 0.0 0.0 - 0.5 K/uL   Basophils Relative 0 %   Basophils Absolute 0.0 0.0 - 0.1 K/uL   Immature Granulocytes 1 %   Abs Immature Granulocytes 0.05 0.00 - 0.07 K/uL  Protime-INR   Collection Time: 11/20/23 12:29 AM  Result Value Ref Range   Prothrombin Time 15.6 (H) 11.4 - 15.2 seconds   INR 1.2 0.8 - 1.2  Resp panel by RT-PCR (RSV, Flu A&B, Covid) Anterior Nasal Swab   Collection Time: 11/20/23 12:35 AM   Specimen: Anterior Nasal Swab  Result Value Ref Range   SARS Coronavirus 2 by RT PCR NEGATIVE NEGATIVE   Influenza A by PCR NEGATIVE NEGATIVE   Influenza B by PCR NEGATIVE NEGATIVE   Resp Syncytial Virus by PCR NEGATIVE NEGATIVE  I-Stat Lactic Acid, ED   Collection Time: 11/20/23 12:36 AM  Result Value Ref Range   Lactic Acid, Venous 1.4 0.5 - 1.9 mmol/L   DG Chest Port 1 View Result Date: 11/20/2023 CLINICAL DATA:  Questionable sepsis - evaluate for abnormality EXAM: PORTABLE CHEST 1 VIEW COMPARISON:  Chest x-ray 09/18/2023 FINDINGS: Cardiomegaly. The heart and mediastinal contours are unchanged. Atherosclerotic plaque. Question developing retrocardiac airspace opacity. No pulmonary edema. Bilateral trace pleural effusions. No pneumothorax. No acute osseous abnormality. IMPRESSION: 1. Question developing retrocardiac airspace opacity. 2. Cardiomegaly with underlying pericardial effusion not excluded. 3. Bilateral trace pleural effusions. Electronically Signed   By: Tish Frederickson M.D.   On: 11/20/2023 01:11    EKG EKG Interpretation Date/Time:  Thursday Analaura Messler 10 2025 23:58:43 EDT Ventricular Rate:  94 PR Interval:  195 QRS Duration:  90 QT Interval:  366 QTC Calculation: 458 R Axis:   15  Text Interpretation: Sinus rhythm RSR' in V1 or V2, probably normal variant Consider left ventricular hypertrophy Borderline T abnormalities, lateral leads Confirmed by Nicanor Alcon, Nanea Jared ([id number]) on 11/20/2023 12:00:44 AM  Radiology DG Chest Port 1  View Result Date: 11/20/2023 CLINICAL DATA:  Questionable sepsis - evaluate for abnormality EXAM: PORTABLE CHEST 1 VIEW COMPARISON:  Chest x-ray 09/18/2023 FINDINGS: Cardiomegaly. The heart and mediastinal contours are unchanged. Atherosclerotic plaque. Question developing retrocardiac airspace opacity. No pulmonary edema. Bilateral trace pleural effusions. No pneumothorax. No acute osseous abnormality. IMPRESSION: 1. Question developing retrocardiac airspace opacity. 2. Cardiomegaly with underlying pericardial effusion not excluded. 3. Bilateral trace pleural effusions. Electronically Signed   By: Tish Frederickson M.D.   On: 11/20/2023 01:11    Procedures .Critical Care  Performed by: Cy Blamer, MD Authorized by: Cy Blamer, MD   Critical care provider statement:    Critical care time (minutes):  60   Critical care end time:  11/20/2023 4:02 AM   Critical care was necessary to treat or prevent imminent or life-threatening deterioration of the following conditions:  Sepsis and renal failure   Critical care was time spent personally by me on the following activities:  Examination of patient, discussions with consultants, ordering and performing treatments and interventions, ordering and review of laboratory studies, ordering and review of radiographic studies, pulse oximetry, re-evaluation of patient's condition and review of old charts   Care discussed with: admitting provider       Medications Ordered in ED Medications  vancomycin (VANCOREADY) IVPB 1500 mg/300 mL (1,500 mg Intravenous New Bag/Given 11/20/23 0238)  albuterol (PROVENTIL) (2.5 MG/3ML) 0.083% nebulizer solution 2.5 mg (has no administration in time range)  sodium bicarbonate injection 50 mEq (has no administration in time range)  ceFEPIme (MAXIPIME) 2 g in sodium chloride 0.9 % 100 mL IVPB (0 g Intravenous Stopped 11/20/23 0104)  metroNIDAZOLE (FLAGYL) IVPB 500 mg (0 mg Intravenous Stopped 11/20/23 0208)  acetaminophen  (TYLENOL) tablet 1,000 mg (1,000 mg Oral Given 11/20/23 0104)    ED Course/ Medical Decision Making/ A&P                                 Medical Decision Making Patient with sepsis just stopped antibiotics yesterday   Amount and/or Complexity of Data Reviewed Independent Historian: EMS    Details: See above  External Data Reviewed: labs and notes.    Details: Previous notes and admissions reviewed  Labs: ordered.    Details: Negative covid and flu.  Normal white count 8.9, low hemoglobin 9.9, normal platelets lactate normal 1.4 (will hold fluids d/t normal lactate and ESRD) normal sodium 135, elevated potassium 5.4, elevated creatinine 7.64  Radiology: ordered and independent interpretation performed.    Details: Pleural effusions  ECG/medicine tests: ordered and independent interpretation performed. Decision-making details documented in ED Course.  Risk OTC drugs. Prescription drug management. Decision regarding hospitalization.    Final Clinical Impression(s) / ED Diagnoses Final diagnoses:  Sepsis, due to unspecified organism, unspecified whether acute organ dysfunction present [facility])   The patient appears reasonably stabilized for admission considering the current resources, flow, and capabilities available in the ED at this time, and I doubt any other [facility] requiring further screening and/or treatment in the ED prior to admission.  Rx / DC Orders ED Discharge Orders     None         Phinehas Grounds, MD 11/20/23 [phone]

## 2023-11-20 NOTE — Consult Note (Signed)
 [facility] Renal Consultation Note    Indication for Consultation:  Management of ESRD/hemodialysis, anemia, hypertension/volume, and secondary hyperparathyroidism.  HPI: [name] is a 73 y.o. 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 Patient reports generalized weakness lately but yesterday he was so weak that he was unable to walk. He did have a recent hospitalization from 09/18/23-09/24/23 with staph epidermidis bacteremia and completed a course of ancef. Patient reports he has been attending dialysis as scheduled with no issues, last treatment 2 days ago. He denies fevers at home but reports he had a fever when he arrived. Reports he had some chest pain earlier today which has subsided. Denies SOB, dizziness, HA, orthopnea, abdominal pain, N/V/D, dysuria, flank pain. Denies any recent issues with his AVG including redness, fever, warmth, pain. VS notable for T 101.49F, mild HTN. Labs show K+ 5.4, BUN 60, Cr 7.64, Ca 8.8, WBC 8.9, Hgb 9.9, Plt 171. Lactic acid waas WNL, respiratory panel negative, CT chest pending but CXR with small pleural effusions. Pt reports he is feeling ok right now but his legs are still feeling very weak. Outpatient HD records show good compliance with HD but RN note does report recent "encephalopathy with outbursts of incoherent nature however this has improved greatly."   Past Medical History:  Diagnosis Date   Anemia    Anemia    Benign colon polyp 08/11/2010   Clot    STATED TOLD IN APRIL CLOT TO LEFT SHOULDER. 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   HTN (hypertension)    Hyperlipidemia    Seizures (HCC)    Past Surgical History:  Procedure Laterality Date   AV FISTULA PLACEMENT   01/19/2012   Procedure: ARTERIOVENOUS (AV) FISTULA CREATION;  Surgeon: Fransisco Hertz, MD;  Location: [facility] OR;  Service: Vascular;  Laterality: Right;  Right Brachio-cephalic arteriovenous fistula   AV FISTULA PLACEMENT Right 07/18/2021   Procedure: INSERTION OF RIGHT UPPER EXTREMITY ARTERIOVENOUS (AV) GORE-TEX GRAFT;  Surgeon: Leonie Douglas, MD;  Location: [facility] OR;  Service: Vascular;  Laterality: Right;  PERIPHERAL NERVE BLOCK   AV FISTULA PLACEMENT, RADIOCEPHALIC  12/31/10   Right arm   INSERTION OF DIALYSIS CATHETER  01/19/2012   Procedure: INSERTION OF DIALYSIS CATHETER;  Surgeon: Fransisco Hertz, MD;  Location: [facility] OR;  Service: Vascular;  Laterality: N/A;  right internal jugular vein   TRANSESOPHAGEAL ECHOCARDIOGRAM (CATH LAB) N/A 09/23/2023   Procedure: TRANSESOPHAGEAL ECHOCARDIOGRAM;  Surgeon: Christell Constant, MD;  Location: [facility] INVASIVE CV LAB;  Service: Cardiovascular;  Laterality: N/A;   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   UPPER EXTREMITY VENOGRAPHY Right 06/11/2021   Procedure: UPPER EXTREMITY VENOGRAPHY;  Surgeon: Nada Libman, MD;  Location: [facility] INVASIVE CV LAB;  Service: Cardiovascular;  Laterality: Right;   VENOGRAM Left 05/30/2021   Procedure: DIAGNOSTIC CENTRAL VENOGRAM;  Surgeon: Victorino Sparrow, MD;  Location: [facility] OR;  Service: Vascular;  Laterality: Left;   Family History  Problem Relation Age of Onset   Diabetes Father    Colon cancer Neg Hx    Esophageal cancer Neg Hx    Stomach cancer Neg Hx    Rectal cancer Neg Hx  Social History:  reports that he has never smoked. He has never used smokeless tobacco. He reports that he does not drink alcohol and does not use drugs.  ROS: As per HPI otherwise negative.   Physical Exam: Vitals:   11/20/23 0815 11/20/23 0825 11/20/23 0904 11/20/23 0940  BP: (!) 164/73  (!) 164/73 (!) 154/77  Pulse: 83  87 86  Resp: 17  (!) 22 18  Temp:  98.3 F (36.8 C) 99.4 F (37.4 C) (!) 101.2 F (38.4 C)  TempSrc:  Temporal Temporal  Oral  SpO2: 96%  100% 91%  Weight:      Height:         General: Well developed, alert, calm NAD. Head: Normocephalic, atraumatic, sclera non-icteric, mucus membranes are moist. Lungs: Clear bilaterally to auscultation without wheezes, rales, or rhonchi. Breathing is unlabored. Heart: RRR with normal S1, S2. No murmurs, rubs, or gallops appreciated. Abdomen: Soft, non-tender, non-distended with normoactive bowel sounds. No rebound/guarding. No obvious abdominal masses. Musculoskeletal:  Strength and tone appear normal for age. Lower extremities: No edema b/l lower extremities Neuro: Alert and oriented X 3. Moves all extremities spontaneously. Psych:  Responds to questions appropriately with a normal affect. Dialysis Access: RUE AVG + t/b, no redness, induration or drainage  No Known Allergies Prior to Admission medications   Medication Sig Start Date End Date Taking? Authorizing Provider  acetaminophen (TYLENOL) 500 MG tablet Take 1,000 mg by mouth every 8 (eight) hours as needed for moderate pain.   Yes [provider]  amLODipine (NORVASC) 10 MG tablet Take 10 mg by mouth daily. 06/30/23  Yes [provider]  calcitRIOL (ROCALTROL) 0.5 MCG capsule Take 0.5 mcg by mouth See admin instructions. Take one tablet by mouth three times a week on Monday, Wednesday and Fridays per wife 04/20/23  Yes [provider]  carvedilol (COREG) 25 MG tablet Take 25 mg by mouth 2 (two) times daily with a meal.   Yes [provider]  hydrALAZINE (APRESOLINE) 25 MG tablet Take 1 tablet (25 mg total) by mouth 2 (two) times daily. 09/24/23  Yes Ghimire, Werner Lean, MD  sevelamer carbonate (RENVELA) 800 MG tablet Take 1,600 mg by mouth 3 (three) times daily with meals. 04/01/21  Yes [provider]  tiZANidine (ZANAFLEX) 2 MG tablet Take 2 mg by mouth every 8 (eight) hours as needed for muscle spasms. 09/15/23  Yes [provider]  clonazePAM (KLONOPIN) 2 MG  disintegrating tablet Take 1 tablet (2 mg total) by mouth daily. Patient not taking: Reported on 09/18/2023 09/13/23   [name], MD  levETIRAcetam (KEPPRA) 500 MG tablet Take 1 tablet (500 mg total) by mouth daily. Patient not taking: Reported on 11/20/2023 09/24/23 10/24/23  Maretta Bees, MD   Current Facility-Administered Medications  Medication Dose Route Frequency Provider Last Rate Last Admin   acetaminophen (TYLENOL) tablet 650 mg  650 mg Oral Q6H PRN Clydie Braun, MD       Or   acetaminophen (TYLENOL) suppository 650 mg  650 mg Rectal Q6H PRN Madelyn Flavors A, MD       albuterol (PROVENTIL) (2.5 MG/3ML) 0.083% nebulizer solution 2.5 mg  2.5 mg Nebulization Q2H PRN Smith, Rondell A, MD       amLODipine (NORVASC) tablet 10 mg  10 mg Oral Daily Katrinka Blazing, Rondell A, MD   10 mg at 11/20/23 1041   calcitRIOL (ROCALTROL) capsule 0.5 mcg  0.5 mcg Oral Q M,W,F-2000 Clydie Braun, MD  carvedilol (COREG) tablet 25 mg  25 mg Oral BID WC Smith, Rondell A, MD   25 mg at 11/20/23 1042   [START ON 11/21/2023] Chlorhexidine Gluconate Cloth 2 % PADS 6 each  6 each Topical Q0600 Oretha Milch, PA-C       heparin injection 5,000 Units  5,000 Units Subcutaneous Q8H Madelyn Flavors A, MD   5,000 Units at 11/20/23 1042   hydrALAZINE (APRESOLINE) tablet 25 mg  25 mg Oral BID Madelyn Flavors A, MD   25 mg at 11/20/23 1042   sevelamer carbonate (RENVELA) tablet 1,600 mg  1,600 mg Oral TID WC Smith, Rondell A, MD   1,600 mg at 11/20/23 1225   sodium chloride flush (NS) 0.9 % injection 3 mL  3 mL Intravenous Q12H Smith, Rondell A, MD   3 mL at 11/20/23 1042   Labs: Basic Metabolic Panel: Recent Labs  Lab 11/20/23 0029  NA 135  K 5.4*  CL 93*  CO2 28  GLUCOSE 108*  BUN 60*  CREATININE 7.64*  CALCIUM 8.8*   Liver Function Tests: Recent Labs  Lab 11/20/23 0029  AST 34  ALT 16  ALKPHOS 83  BILITOT 0.9  PROT 7.9  ALBUMIN 3.0*   No results for input(s): "LIPASE", "AMYLASE" in the  last 168 hours. No results for input(s): "AMMONIA" in the last 168 hours. CBC: Recent Labs  Lab 11/20/23 0029  WBC 8.9  NEUTROABS 7.3  HGB 9.9*  HCT 32.3*  MCV 89.7  PLT 171   Cardiac Enzymes: No results for input(s): "CKTOTAL", "CKMB", "CKMBINDEX", "TROPONINI" in the last 168 hours. CBG: No results for input(s): "GLUCAP" in the last 168 hours. Iron Studies: No results for input(s): "IRON", "TIBC", "TRANSFERRIN", "FERRITIN" in the last 72 hours. Studies/Results: DG Chest Port 1 View Result Date: 11/20/2023 CLINICAL DATA:  Questionable sepsis - evaluate for abnormality EXAM: PORTABLE CHEST 1 VIEW COMPARISON:  Chest x-ray 09/18/2023 FINDINGS: Cardiomegaly. The heart and mediastinal contours are unchanged. Atherosclerotic plaque. Question developing retrocardiac airspace opacity. No pulmonary edema. Bilateral trace pleural effusions. No pneumothorax. No acute osseous abnormality. IMPRESSION: 1. Question developing retrocardiac airspace opacity. 2. Cardiomegaly with underlying pericardial effusion not excluded. 3. Bilateral trace pleural effusions. Electronically Signed   By: Tish Frederickson M.D.   On: 11/20/2023 01:11    Outpatient Dialysis Orders:  Center: [facility]  on MWF . MWF 180Nre 4 hours BFR 400 DFR 800 EDW 68.8kg 2K 2Ca AVG 15g Heparin 4000 unit bolus  Mircera IV q 2 weeks- last dose 11/18/23 Venofer 100mg  IV q HD-stop date 11/23/23 Sensipar 60mg  PO q HD  Assessment/Plan:  Weakness/fever: Work up per admitting team, blood cultures and CT head are pending  ESRD:  On MWF schedule, HD today per regular schedule  Hypertension/volume: BP moderately elevated, appears euvolemic on exam. Has been reaching EDW. UF with HD as tolerated. Continue home BP meds  Anemia: Hgb 9.9, not due for ESA yet, received a few doses of venofer but will hold today with ongoing infection work up  Metabolic bone disease: Calcium at goal, follow phos. Continue sensipar  Nutrition:  Renal diet with  fluid restrictions  Rogers Blocker, PA-C 11/20/2023, 1:05 PM   [facility] Pager: [phone]

## 2023-11-21 DIAGNOSIS — R531 Weakness: Secondary | ICD-10-CM | POA: Diagnosis not present

## 2023-11-21 LAB — CBC WITH DIFFERENTIAL/PLATELET
Abs Immature Granulocytes: 0.02 10*3/uL (ref 0.00–0.07)
Basophils Absolute: 0 10*3/uL (ref 0.0–0.1)
Basophils Relative: 1 %
Eosinophils Absolute: 0.1 10*3/uL (ref 0.0–0.5)
Eosinophils Relative: 4 %
HCT: 30.7 % — ABNORMAL LOW (ref 39.0–52.0)
Hemoglobin: 9.5 g/dL — ABNORMAL LOW (ref 13.0–17.0)
Immature Granulocytes: 1 %
Lymphocytes Relative: 15 %
Lymphs Abs: 0.6 10*3/uL — ABNORMAL LOW (ref 0.7–4.0)
MCH: 27.1 pg (ref 26.0–34.0)
MCHC: 30.9 g/dL (ref 30.0–36.0)
MCV: 87.7 fL (ref 80.0–100.0)
Monocytes Absolute: 0.6 10*3/uL (ref 0.1–1.0)
Monocytes Relative: 16 %
Neutro Abs: 2.6 10*3/uL (ref 1.7–7.7)
Neutrophils Relative %: 63 %
Platelets: 142 10*3/uL — ABNORMAL LOW (ref 150–400)
RBC: 3.5 MIL/uL — ABNORMAL LOW (ref 4.22–5.81)
RDW: 16.7 % — ABNORMAL HIGH (ref 11.5–15.5)
WBC: 3.9 10*3/uL — ABNORMAL LOW (ref 4.0–10.5)
nRBC: 0 % (ref 0.0–0.2)

## 2023-11-21 LAB — RENAL FUNCTION PANEL
Albumin: 2.7 g/dL — ABNORMAL LOW (ref 3.5–5.0)
Anion gap: 16 — ABNORMAL HIGH (ref 5–15)
BUN: 33 mg/dL — ABNORMAL HIGH (ref 8–23)
CO2: 27 mmol/L (ref 22–32)
Calcium: 8.7 mg/dL — ABNORMAL LOW (ref 8.9–10.3)
Chloride: 92 mmol/L — ABNORMAL LOW (ref 98–111)
Creatinine, Ser: 5.39 mg/dL — ABNORMAL HIGH (ref 0.61–1.24)
GFR, Estimated: 11 mL/min — ABNORMAL LOW (ref >60)
Glucose, Bld: 82 mg/dL (ref 70–99)
Phosphorus: 4.2 mg/dL (ref 2.5–4.6)
Potassium: 4.3 mmol/L (ref 3.5–5.1)
Sodium: 135 mmol/L (ref 135–145)

## 2023-11-21 LAB — HEPATITIS B SURFACE ANTIBODY, QUANTITATIVE: Hep B S AB Quant (Post): 66.1 m[IU]/mL

## 2023-11-21 NOTE — Progress Notes (Signed)
 [facility] Progress Note   Subjective:   Pt had HD overnight with 2.2L UF. Standing with PT. No new concerns reported.   Objective Vitals:   11/21/23 0349 11/21/23 0355 11/21/23 0427 11/21/23 0720  BP: 127/76 (!) 140/88 (!) 141/65 136/67  Pulse: 70 69 72 74  Resp: 15 17 16 17   Temp:  98.5 F (36.9 C) 98.4 F (36.9 C) 99.6 F (37.6 C)  TempSrc:  Oral Oral Oral  SpO2: 100% 98% 94% 96%  Weight:      Height:       Physical Exam General: Alert male in NAD Lungs: Respirations unlabored on RA Abdomen: Non-distended Extremities: No edema b/l lower extremities Dialysis Access: AVG + t/b  Additional Objective Labs: Basic Metabolic Panel: Recent Labs  Lab 11/20/23 0029 11/21/23 0903  NA 135 135  K 5.4* 4.3  CL 93* 92*  CO2 28 27  GLUCOSE 108* 82  BUN 60* 33*  CREATININE 7.64* 5.39*  CALCIUM 8.8* 8.7*  PHOS  --  4.2   Liver Function Tests: Recent Labs  Lab 11/20/23 0029 11/21/23 0903  AST 34  --   ALT 16  --   ALKPHOS 83  --   BILITOT 0.9  --   PROT 7.9  --   ALBUMIN 3.0* 2.7*   No results for input(s): "LIPASE", "AMYLASE" in the last 168 hours. CBC: Recent Labs  Lab 11/20/23 0029 11/21/23 0903  WBC 8.9 3.9*  NEUTROABS 7.3 2.6  HGB 9.9* 9.5*  HCT 32.3* 30.7*  MCV 89.7 87.7  PLT 171 142*   Blood Culture    Component Value Date/Time   SDES BLOOD LEFT ANTECUBITAL 11/20/2023 0035   SPECREQUEST AEROBIC BOTTLE ONLY Blood Culture adequate volume 11/20/2023 0035   CULT  11/20/2023 0035    NO GROWTH 1 DAY Performed at [facility], [address], [place], [place] [address]    REPTSTATUS PENDING 11/20/2023 4782    Cardiac Enzymes: No results for input(s): "CKTOTAL", "CKMB", "CKMBINDEX", "TROPONINI" in the last 168 hours. CBG: No results for input(s): "GLUCAP" in the last 168 hours. Iron Studies: No results for input(s): "IRON", "TIBC", "TRANSFERRIN", "FERRITIN" in the last 72 hours. @lablastinr3 @ Studies/Results: CT HEAD WO  CONTRAST ( ) Result Date: 11/20/2023 CLINICAL DATA:  Neuro deficit, acute, stroke suspected. EXAM: CT HEAD WITHOUT CONTRAST TECHNIQUE: Contiguous axial images were obtained from the base of the skull through the vertex without intravenous contrast. RADIATION DOSE REDUCTION: This exam was performed according to the departmental dose-optimization program which includes automated exposure control, adjustment of the mA and/or kV according to patient size and/or use of iterative reconstruction technique. COMPARISON:  CT head without contrast 09/18/2023. FINDINGS: Brain: Mild generalized atrophy and moderate white matter disease is stable. No acute infarct, hemorrhage, or mass lesion is present. Deep brain nuclei are within normal limits. The ventricles are proportionate to the degree of atrophy. Midline structures are within normal limits. The brainstem and cerebellum are within normal limits. Vascular: Atherosclerotic calcifications are present within the cavernous internal carotid arteries bilaterally and at the dural margin of both vertebral arteries. No hyperdense vessel is present. Skull: Calvarium is intact. No focal lytic or blastic lesions are present. No significant extracranial soft tissue lesion is present. Sinuses/Orbits: The paranasal sinuses and mastoid air cells are clear. The globes and orbits are within normal limits. IMPRESSION: 1. No acute intracranial abnormality or significant interval change. 2. Stable atrophy and white matter disease. This likely reflects the sequela of chronic microvascular  ischemia. Electronically Signed   By: Audree Leas M.D.   On: 11/20/2023 16:03   DG Chest Port 1 View Result Date: 11/20/2023 CLINICAL DATA:  Questionable sepsis - evaluate for abnormality EXAM: PORTABLE CHEST 1 VIEW COMPARISON:  Chest x-ray 09/18/2023 FINDINGS: Cardiomegaly. The heart and mediastinal contours are unchanged. Atherosclerotic plaque. Question developing retrocardiac airspace  opacity. No pulmonary edema. Bilateral trace pleural effusions. No pneumothorax. No acute osseous abnormality. IMPRESSION: 1. Question developing retrocardiac airspace opacity. 2. Cardiomegaly with underlying pericardial effusion not excluded. 3. Bilateral trace pleural effusions. Electronically Signed   By: Morgane  Naveau M.D.   On: 11/20/2023 01:11   Medications:  ceFEPime (MAXIPIME) IV 1 g (11/21/23 0534)   [START ON 11/23/2023] vancomycin Stopped (11/21/23 0402)   vancomycin      amLODipine  10 mg Oral Daily   calcitRIOL  0.5 mcg Oral Q M,W,F-2000   carvedilol  25 mg Oral BID WC   Chlorhexidine Gluconate Cloth  6 each Topical Q0600   cinacalcet  60 mg Oral Q M,W,F-1800   heparin  5,000 Units Subcutaneous Q8H   hydrALAZINE  25 mg Oral BID   sevelamer carbonate  1,600 mg Oral TID WC   sodium chloride flush  3 mL Intravenous Q12H    Outpatient Dialysis Orders: Center: [facility]  on MWF . MWF 180Nre 4 hours BFR 400 DFR 800 EDW 68.8kg 2K 2Ca AVG 15g Heparin 4000 unit bolus  Mircera 150mcg IV q 2 weeks- last dose 11/18/23 Venofer 100mg  IV q HD-stop date 11/23/23 Sensipar 60mg  PO q HD    Assessment/Plan:  Weakness/fever: Work up per admitting team, blood cultures are pending, on empiric antibiotics  ESRD:  On MWF schedule, HD Monday per regular schedule  Hypertension/volume: BP controlled, appears euvolemic on exam. Has been reaching EDW. Continue home BP meds  Anemia: Hgb 9.5, not due for ESA yet, received a few doses of venofer but will hold today with ongoing infection work up  Metabolic bone disease: Calcium at goal, follow phos. Continue sensipar  Nutrition:  Renal diet with fluid restrictions  Ramona Burner, PA-C 11/21/2023, 10:12 AM  [facility] Pager: [phone]

## 2023-11-21 NOTE — Progress Notes (Signed)
 Inpatient Rehab Admissions Coordinator:   Per therapy recommendations, patient was screened for CIR candidacy by Megan Salon, MS, CCC-SLP. At this time, Pt. does not appear to demonstrate medical necessity to justify in hospital rehabilitation/CIR. I  will not pursue a rehab consult for this Pt.   Recommend other rehab venues to be pursued.  Please contact me with any questions.  Megan Salon, MS, CCC-SLP Rehab Admissions Coordinator  [phone] (celll) [phone] (office)

## 2023-11-21 NOTE — Evaluation (Signed)
 Physical Therapy Evaluation Patient Details Name: [name] MRN: [id number] DOB: Apr 18, 1951 Today's Date: 11/21/2023  History of Present Illness  Pt is a 73 y/o male presenting on 4/10 with weakness. Noted recent hospitalizations in feb for sepsis, and in jan for flu/AMS/seizure. Admitted with SIRS/sepsis due to CAP. PMH includes: HTN, ESRD on HD, DM2, seizure disorder, anemia of chronic disease, endocarditis.  Clinical Impression  Pt is a 73 y.o. male presenting on 11/19/23 with above conditions and deficits below, see PT Problem List. PTA pt lived in a two-level home with his wife, was able to live on the main level, and recently stopped using a RW provided to him during his recent admission. Additionally, the pt presents with a new onset of bil UEs and trunk tremors that began when he statically sat EOB. The pt and his wife reported the tremors have recently started. Currently he is min A for bed mobility, transfers, and ambulation with a RW. Pt displays deficits in functional mobility, dynamic and static balance, LE strength, gait, and activity tolerance and would benefit from continued acute PT. Given the pt's family support, PLOF, rehab goals, he would benefit from intense rehab > 3 hours upon d/c to promote return to functional independence. Pt would benefit from continued mobility with nurses and mobility specialists until d/c. Will continue to follow acutely.           If plan is discharge home, recommend the following: A little help with walking and/or transfers;A little help with bathing/dressing/bathroom;Assistance with cooking/housework;Assist for transportation;Help with stairs or ramp for entrance   Can travel by private vehicle        Equipment Recommendations None recommended by PT  Recommendations for Other Services  Rehab consult    Functional Status Assessment Patient has had a recent decline in their functional status and demonstrates the ability to make significant  improvements in function in a reasonable and predictable amount of time.     Precautions / Restrictions Precautions Precautions: Fall Recall of Precautions/Restrictions: Impaired Restrictions Weight Bearing Restrictions Per Provider Order: No      Mobility  Bed Mobility Overal bed mobility: Needs Assistance Bed Mobility: Rolling, Sidelying to Sit Rolling: Min assist, Used rails Sidelying to sit: Min assist, HOB elevated, Used rails       General bed mobility comments: pt required min A for hand placement for rolling and trunk elevation and proper sequencing for sidelying to sit. Required additional time for both movements    Transfers Overall transfer level: Needs assistance Equipment used: Rolling walker (2 wheels) Transfers: Sit to/from Stand Sit to Stand: Min assist           General transfer comment: pt required min A for hand placement and power up from lowest bed height.    Ambulation/Gait Ambulation/Gait assistance: Min assist Gait Distance (Feet): 56 Feet Assistive device: Rolling walker (2 wheels) Gait Pattern/deviations: Step-to pattern, Decreased stride length, Knee flexed in stance - right, Knee flexed in stance - left, Shuffle, Trunk flexed Gait velocity: reduced Gait velocity interpretation: <1.8 ft/sec, indicate of risk for recurrent falls   General Gait Details: pt walks with short step-to-pattern and increased trunk flexion. Required consistent cueing to limit trunk flexion and R>L knee extension. Took 3-4 standing rest breaks and complained of R hand discomfort.  Stairs            Wheelchair Mobility     Tilt Bed    Modified Rankin (Stroke Patients Only)  Balance Overall balance assessment: Needs assistance Sitting-balance support: No upper extremity supported, Bilateral upper extremity supported, Feet supported Sitting balance-Leahy Scale: Good Sitting balance - Comments: pt sits with bil and no UE support. With bil UE support  pt has tremors in bil UEs, R>L and his trunk that he reports just recently began Postural control: Other (comment) (anterior when standing) Standing balance support: Bilateral upper extremity supported, Reliant on assistive device for balance, During functional activity Standing balance-Leahy Scale: Poor Standing balance comment: pt reliant on RW for standing balance. Stands with increased trunk and knee flexion R>L             High level balance activites: Turns, Head turns High Level Balance Comments: Pt stops walking when performing vertical and horizontal head turns and turns slowly requiring cueing for maintaining proximity to RW             Pertinent Vitals/Pain Pain Assessment Pain Assessment: No/denies pain    Home Living Family/patient expects to be discharged to:: Private residence Living Arrangements: Spouse/significant other Available Help at Discharge: Family;Available 24 hours/day Type of Home: House Home Access: Stairs to enter Entrance Stairs-Rails: None Entrance Stairs-Number of Steps: 3-4 Alternate Level Stairs-Number of Steps: flight Home Layout: Two level;Able to live on main level with bedroom/bathroom Home Equipment: Rolling Walker (2 wheels);Shower seat;BSC/3in1      Prior Function Prior Level of Function : Needs assist;Independent/Modified Independent       Physical Assist : Mobility (physical);ADLs (physical) Mobility (physical): Gait;Stairs   Mobility Comments: used RW from previous admission for a few days but recently has been indep ADLs Comments: doesn't drive or work     Extremity/Trunk Assessment   Upper Extremity Assessment Upper Extremity Assessment: Defer to OT evaluation    Lower Extremity Assessment Lower Extremity Assessment: RLE deficits/detail;LLE deficits/detail RLE Deficits / Details: Grossly 3+/5 for knee ext and DF and required multiple cues to correctly perform knee ext MMT. RLE Coordination: decreased gross  motor LLE Deficits / Details: Grossly 4+/5 MMT LLE Coordination: decreased gross motor    Cervical / Trunk Assessment Cervical / Trunk Assessment: Kyphotic  Communication   Communication Communication: No apparent difficulties Factors Affecting Communication: Other (comment) (soft spoken)    Cognition Arousal: Alert Behavior During Therapy: Flat affect   PT - Cognitive impairments: No apparent impairments                       PT - Cognition Comments: pt took increased time to answer questions and required multiple cues when asked to complete some tasks or questions Following commands: Impaired Following commands impaired: Follows one step commands with increased time     Cueing Cueing Techniques: Verbal cues, Gestural cues, Tactile cues     General Comments      Exercises     Assessment/Plan    PT Assessment Patient needs continued PT services  PT Problem List Decreased strength;Decreased range of motion;Decreased activity tolerance;Decreased balance;Decreased mobility;Decreased coordination;Decreased knowledge of use of DME;Cardiopulmonary status limiting activity       PT Treatment Interventions DME instruction;Gait training;Stair training;Functional mobility training;Therapeutic activities;Therapeutic exercise;Neuromuscular re-education;Balance training;Cognitive remediation;Patient/family education    PT Goals (Current goals can be found in the Care Plan section)  Acute Rehab PT Goals Patient Stated Goal: improve mobility PT Goal Formulation: With patient/family Time For Goal Achievement: 12/05/23 Potential to Achieve Goals: Good    Frequency Min 2X/week     Co-evaluation  AM-PAC PT "6 Clicks" Mobility  Outcome Measure Help needed turning from your back to your side while in a flat bed without using bedrails?: A Little Help needed moving from lying on your back to sitting on the side of a flat bed without using bedrails?: A  Little Help needed moving to and from a bed to a chair (including a wheelchair)?: A Little Help needed standing up from a chair using your arms (e.g., wheelchair or bedside chair)?: A Little Help needed to walk in hospital room?: A Little Help needed climbing 3-5 steps with a railing? : A Lot 6 Click Score: 17    End of Session Equipment Utilized During Treatment: Gait belt Activity Tolerance: Patient limited by fatigue Patient left: in chair;with call bell/phone within reach;with chair alarm set;with family/visitor present Nurse Communication: Mobility status PT Visit Diagnosis: Unsteadiness on feet (R26.81);Other abnormalities of gait and mobility (R26.89);Muscle weakness (generalized) (M62.81);Other symptoms and signs involving the nervous system (R29.898)    Time: 1610-9604 PT Time Calculation (min) (ACUTE ONLY): 19 min   Charges:   PT Evaluation $PT Eval Moderate Complexity: 1 Mod   PT General Charges $$ ACUTE PT VISIT: 1 Visit         [address], SPT   [place] 11/21/2023, 10:55 AM

## 2023-11-21 NOTE — Evaluation (Addendum)
 Occupational Therapy Evaluation Patient Details Name: [name] MRN: [id number] DOB: 1951/02/06 Today's Date: 11/21/2023   History of Present Illness   Pt is a 73 y/o male presenting on 4/10 with weakness. Noted recent hospitalizations in feb for sepsis, and in jan for flu/AMS/seizure. Admitted with SIRS/sepsis due to CAP. PMH includes: HTN, ESRD on HD, DM2, seizure disorder, anemia of chronic disease, endocarditis.     Clinical Impressions PTA patient reports using no AD for mobility but needing RW the last few days prior to admission, needing some assist for ADLs since initial admission in jan/feb.  He is oriented and follows commands, engages appropriately during session.  Pt fatigued and had just returned to bed, but agreeable to mobilize again. Pt requires min assist for bed mobility and transfers using RW, min assist to setup for ADLs. Up to min assist for functional mobility using RW, with losses of balance with turns noted.  Pt does reports tremors for the past 2 week, educated on compensatory techniques for feeding to increase ease with task. Based on performance today, pt will best benefit from continued OT services acutely and after dc at [facility] level to optimize independence and safety with ADLs, IADLs and mobility.       If plan is discharge home, recommend the following:   A little help with walking and/or transfers;A little help with bathing/dressing/bathroom;Assistance with cooking/housework;Assist for transportation;Help with stairs or ramp for entrance     Functional Status Assessment   Patient has had a recent decline in their functional status and demonstrates the ability to make significant improvements in function in a reasonable and predictable amount of time.     Equipment Recommendations   None recommended by OT     Recommendations for Other Services         Precautions/Restrictions   Precautions Precautions: Fall Recall of  Precautions/Restrictions: Intact Restrictions Weight Bearing Restrictions Per Provider Order: No     Mobility Bed Mobility Overal bed mobility: Needs Assistance Bed Mobility: Supine to Sit, Sit to Supine     Supine to sit: Min assist Sit to supine: Contact guard assist   General bed mobility comments: min assist to fully ascend and scoot forward, increased time required    Transfers Overall transfer level: Needs assistance Equipment used: Rolling walker (2 wheels) Transfers: Sit to/from Stand Sit to Stand: Min assist           General transfer comment: to power up and steady, cueing for hand placement      Balance Overall balance assessment: Needs assistance Sitting-balance support: No upper extremity supported, Bilateral upper extremity supported, Feet supported Sitting balance-Leahy Scale: Fair Sitting balance - Comments: min guard, improving to close supervision once fully upright at EOB   Standing balance support: Bilateral upper extremity supported, Reliant on assistive device for balance, During functional activity Standing balance-Leahy Scale: Poor Standing balance comment: relies on RW                           ADL either performed or assessed with clinical judgement   ADL Overall ADL's : Needs assistance/impaired     Grooming: Set up;Sitting           Upper Body Dressing : Set up;Sitting   Lower Body Dressing: Sit to/from stand;Minimal assistance   Toilet Transfer: Minimal assistance;Ambulation;Rolling walker (2 wheels)           Functional mobility during ADLs: Minimal assistance;Rolling walker (2 wheels)  Vision   Vision Assessment?: No apparent visual deficits     Perception         Praxis         Pertinent Vitals/Pain Pain Assessment Pain Assessment: No/denies pain     Extremity/Trunk Assessment Upper Extremity Assessment Upper Extremity Assessment: Generalized weakness (BUE tremors)   Lower Extremity  Assessment Lower Extremity Assessment: Defer to PT evaluation   Cervical / Trunk Assessment Cervical / Trunk Assessment: Kyphotic   Communication Communication Communication: No apparent difficulties Factors Affecting Communication: Other (comment) (soft spoken)   Cognition Arousal: Alert Behavior During Therapy: Flat affect Cognition: No family/caregiver present to determine baseline             OT - Cognition Comments: not formally assessed, but pt oriented and following commands.  able to recall hopsitalizations and engages appropraitely during session.                 Following commands: Impaired Following commands impaired: Follows multi-step commands with increased time     Cueing  General Comments   Cueing Techniques: Verbal cues  pt with several LOB with turns during in room mobility and requires min assist to correct   Exercises     Shoulder Instructions      Home Living Family/patient expects to be discharged to:: Private residence Living Arrangements: Spouse/significant other Available Help at Discharge: Family;Available 24 hours/day Type of Home: House Home Access: Stairs to enter [employer] of Steps: 3-4 Entrance Stairs-Rails: None Home Layout: Two level;Able to live on main level with bedroom/bathroom;Full bath on main level Alternate Level Stairs-Number of Steps: flight Alternate Level Stairs-Rails: Left Bathroom Shower/Tub: Tub/shower unit   Bathroom Toilet: Standard     Home Equipment: Agricultural consultant (2 wheels);Shower seat;BSC/3in1          Prior Functioning/Environment Prior Level of Function : Needs assist;Independent/Modified Independent             Mobility Comments: used RW from previous admission for a few days but recently has been indep ADLs Comments: reports needing some assist with ADLs since he "got sick in Feb", assist for IADLs. Spouse manages medications, pt does not drive.    OT Problem List:  Decreased strength;Decreased activity tolerance;Decreased knowledge of precautions;Decreased knowledge of use of DME or AE;Impaired balance (sitting and/or standing)   OT Treatment/Interventions: Self-care/ADL training;Therapeutic exercise;DME and/or AE instruction;Therapeutic activities;Patient/family education;Balance training;Energy conservation      OT Goals(Current goals can be found in the care plan section)   Acute Rehab OT Goals Patient Stated Goal: get better OT Goal Formulation: With patient Time For Goal Achievement: 12/05/23 Potential to Achieve Goals: Good   OT Frequency:  Min 2X/week    Co-evaluation              AM-PAC OT "6 Clicks" Daily Activity     Outcome Measure Help from another person eating meals?: A Little Help from another person taking care of personal grooming?: A Little Help from another person toileting, which includes using toliet, bedpan, or urinal?: A Little Help from another person bathing (including washing, rinsing, drying)?: A Little Help from another person to put on and taking off regular upper body clothing?: A Little Help from another person to put on and taking off regular lower body clothing?: A Little 6 Click Score: 18   End of Session Equipment Utilized During Treatment: Gait belt;Rolling walker (2 wheels) Nurse Communication: Mobility status;Precautions  Activity Tolerance: Patient tolerated treatment well Patient left: in bed;with call  bell/phone within reach;with bed alarm set  OT Visit Diagnosis: Other abnormalities of gait and mobility (R26.89);Muscle weakness (generalized) (M62.81)                Time: 1610-9604 OT Time Calculation (min): 14 min Charges:  OT General Charges $OT Visit: 1 Visit OT Evaluation $OT Eval Moderate Complexity: 1 Mod  Bary Boss, OT Acute Rehabilitation Services Office [phone] Secure Chat Preferred    Fredrich Jefferson 11/21/2023, 2:46 PM

## 2023-11-21 NOTE — Progress Notes (Signed)
 Received patient in bed to unit.  Alert and oriented. x4 Informed consent signed and in chart.   TX duration:4 hours  Patient tolerated well.  Transported back to the room  Alert, without acute distress.  Hand-off given to patient's nurse.   Access used: avf Access issues: none  Total UF removed: 2200 Medication(s) given: vancomycin 750mg /128ml Post HD VS: see tbale below Post HD weight: 69.6kg   11/21/23 0355  Vitals  Temp 98.5 F (36.9 C)  Temp Source Oral  BP (!) 140/88  MAP (mmHg) 108  BP Location Left Arm  BP Method Automatic  Patient Position (if appropriate) Lying  Pulse Rate 69  Pulse Rate Source Monitor  ECG Heart Rate 70  Resp 17  Oxygen Therapy  SpO2 98 %  O2 Device Room Air  Patient Activity (if Appropriate) In bed  Pulse Oximetry Type Continuous  During Treatment Monitoring  Blood Flow Rate (mL/min) 0 mL/min  Arterial Pressure (mmHg) -2.83 mmHg  Venous Pressure (mmHg) -2.22 mmHg  TMP (mmHg) 26.06 mmHg  Ultrafiltration Rate (mL/min) 810 mL/min  Dialysate Flow Rate (mL/min) 0 ml/min  Duration of HD Treatment -hour(s) 4 hour(s)  Cumulative Fluid Removed (mL) per Treatment  2150.17  HD Safety Checks Performed Yes  Intra-Hemodialysis Comments Tolerated well  Post Treatment  Dialyzer Clearance Lightly streaked  Hemodialysis Intake (mL) 150 mL  Liters Processed 96  Fluid Removed (mL) 2200 mL  Tolerated HD Treatment Yes  Post-Hemodialysis Comments goal met  AVG/AVF Arterial Site Held (minutes) 5 minutes  AVG/AVF Venous Site Held (minutes) 5 minutes  Vascular Access Right Upper arm Arteriovenous fistula  Placement Date/Time: 01/19/12 1410   Placed prior to admission: No  Time Out: Correct patient;Correct site;Correct procedure  Maximum sterile barrier precautions: Hand hygiene;Cap;Mask;Sterile gown;Sterile gloves;Large sterile sheet  Site Prep: Betadine;  Site Condition No complications  Fistula / Graft Assessment Present;Thrill;Bruit;Aneurysm present   Status Deaccessed;Flushed;Patent      [facility]

## 2023-11-21 NOTE — Plan of Care (Signed)

## 2023-11-21 NOTE — Progress Notes (Signed)
 PROGRESS NOTE    [name]  ZOX:[id number] DOB: 01/18/1951 DOA: 11/19/2023 PCP: Arcadio Knuckles, MD  Outpatient Specialists:     Brief Narrative:  As per H&P done on admission: "[name] is a 73 y.o. male with medical history significant of hypertension, hyperlipidemia, ESRD on HD, DM type II, seizure disorder, and anemia of chronic disease with reports of weakness.   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 He completed the course of antibiotics last week.      Yesterday experienced an acute onset of inability to move and generalized weakness that began last night. He was unable to get up or walk.  He had not noticed any fevers. No headaches, chest discomfort, trouble breathing, vomiting, or leg swelling. He reports a little coughing at home.   A follow-up appointment with neurology is scheduled for the 22nd of this month.   He is on a dialysis schedule of Monday, Wednesday, and Friday, with the last session completed on Wednesday, two days ago. No issues were reported during the last dialysis session.     In the emergency department patient was noted to be febrile up to 102 F with tachypnea meeting SIRS criteria.  Labs hemoglobin 9.9, potassium 5.4 BUN 60, creatinine 1.64, INR 1.2, and lactic acid 1.4.  Chest x-ray noted possible developing retrocardiac airspace opacity, cardiomegaly with pericardial effusion not excluded, and bilateral trace pleural effusions.  Influenza, COVID-19, RSV screening were negative.  Blood cultures were obtained.  Patient was given acetaminophen 1000 mg p.o., albuterol neb, sodium bicarb, vancomycin, metronidazole, and cefepime".   11/21/2023: Patient is a 73 year old African-American male with  past medical history significant for end-stage renal disease on hemodialysis, diabetes mellitus and hypertension.  Patient is not a particularly good historian, and tends to have difficulties recalling all the details.  Patient tells me that he had cadaveric kidney transplantation in 2015, by the transplanted kidney failed in 2022.  Patient has been on hemodialysis since then.  Patient has right upper extremity AV graft.  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  Patient has been admitted with possible sepsis secondary to pneumonia versus SIRS.  Patient was noted to be significantly weak.  Fever noted on presentation.  CT chest revealed consolidation left lower lobe.  CT head is nonrevealing.  Patient seems to be improving.  Nephrology input is appreciated.  As documented above, patient is not a particularly good historian.  Above history will need to be reconfirmed.  .  Assessment & Plan:   Principal Problem:   Sepsis (HCC) Active Problems:   Essential hypertension   Anemia   ESRD on dialysis (HCC)   Hyperkalemia   Generalized weakness   History of endocarditis   Seizure disorder (HCC)   Fever SIRS/sepsis secondary to possible community-acquired pneumonia -Patient presents with fever up to 102 F with weakness.  He was noted to be tachypneic meeting SIRS criteria.  Chest x-ray concerning for possible developing retrocardiac airspace opacity.  Blood cultures were obtained.  Patient had initially been started on empiric antibiotics of vancomycin, cefepime, and metronidazole.  Of note patient had just recently been hospitalized completing 6 weeks of antibiotics for staph epididymis bacteremia. -Follow cultures. -Continue IV antibiotics. -Low threshold to consult the infectious disease  team.     Hyperkalemia: -Resolved with dialysis. -Potassium of 4.3 today.  ESRD on HD -Nephrology input is appreciated. -Further care as per  nephrology team.  Generalized weakness Patient reports having generalized weakness for which she was unable to ambulate.  Global weakness noted on physical exam. - Neurochecks -CT head is nonrevealing.- PT/OT to evaluate and treat   Essential hypertension -Currently controlled.  History of endocarditis Patient had been been found to have Staph epidermidis bacteremia with concern for vegetation on TEE during hospitalization from 2/7-2/13/2025.  He was treated with IV Ancef until 10/29/23. -Low threshold to consult infectious disease team.   Possible seizure disorder During hospitalization 1/27-2/2 there had been concern for possible seizure activity.  EEG showed no evidence of seizures and MRI brain showed no focus for seizures.  He was discharged on Keppra 500 mg daily to be continued until follow-up, but follow-up appointment had not been scheduled until the 20th of this month and no refills were given.  - Resume Keppra  - Encouraged patient to keep follow-up appointment with neurology on the 20th of this month.   Anemia of chronic disease Hemoglobin 9.9 which appears around patient's baseline. -Continue to monitor  DVT prophylaxis: Subcutaneous heparin. Code Status: Full code. Family Communication:  Disposition Plan: Patient remains inpatient.   Consultants:  Nephrology.  Procedures:  None.  Antimicrobials:  IV cefepime. IV vancomycin.   Subjective: Poor historian. No fever. Tells me he is improving.  Objective: Vitals:   11/21/23 0349 11/21/23 0355 11/21/23 0427 11/21/23 0720  BP: 127/76 (!) 140/88 (!) 141/65 136/67  Pulse: 70 69 72 74  Resp: 15 17 16 17   Temp:  98.5 F (36.9 C) 98.4 F (36.9 C) 99.6 F (37.6 C)  TempSrc:  Oral Oral Oral  SpO2: 100% 98% 94% 96%  Weight:      Height:        Intake/Output Summary (Last 24 hours) at 11/21/2023 1043 Last data filed at 11/21/2023 0355 Gross per 24 hour  Intake 118 ml  Output 2200 ml  Net -2082 ml   Filed  Weights   11/20/23 0010 11/20/23 2324 11/21/23 0346  Weight: 73 kg 71.2 kg 69.6 kg    Examination:  General exam: Appears calm and comfortable.  Looks chronically ill.  Patient is pale. Respiratory system: Intermittent wheezing.  Adequate air entry.  Cardiovascular system: S1 & S2 heard Gastrointestinal system: Abdomen is soft and nontender. Central nervous system: Awake and alert.  Poor historian.   Extremities: Mild bilateral ankle edema.  Data Reviewed: I have personally reviewed following labs and imaging studies  CBC: Recent Labs  Lab 11/20/23 0029 11/21/23 0903  WBC 8.9 3.9*  NEUTROABS 7.3 2.6  HGB 9.9* 9.5*  HCT 32.3* 30.7*  MCV 89.7 87.7  PLT 171 142*   Basic Metabolic Panel: Recent Labs  Lab 11/20/23 0029 11/21/23 0903  NA 135 135  K 5.4* 4.3  CL 93* 92*  CO2 28 27  GLUCOSE 108* 82  BUN 60* 33*  CREATININE 7.64* 5.39*  CALCIUM 8.8* 8.7*  PHOS  --  4.2   GFR: Estimated Creatinine Clearance: 12.6 mL/min (A) (by C-G formula based on SCr of 5.39 mg/dL (H)). Liver Function Tests: Recent Labs  Lab 11/20/23 0029 11/21/23 0903  AST 34  --   ALT 16  --   ALKPHOS 83  --   BILITOT 0.9  --   PROT 7.9  --   ALBUMIN 3.0* 2.7*   No results for  input(s): "LIPASE", "AMYLASE" in the last 168 hours. No results for input(s): "AMMONIA" in the last 168 hours. Coagulation Profile: Recent Labs  Lab 11/20/23 0029  INR 1.2   Cardiac Enzymes: No results for input(s): "CKTOTAL", "CKMB", "CKMBINDEX", "TROPONINI" in the last 168 hours. BNP (last 3 results) No results for input(s): "PROBNP" in the last 8760 hours. HbA1C: No results for input(s): "HGBA1C" in the last 72 hours. CBG: No results for input(s): "GLUCAP" in the last 168 hours. Lipid Profile: No results for input(s): "CHOL", "HDL", "LDLCALC", "TRIG", "CHOLHDL", "LDLDIRECT" in the last 72 hours. Thyroid Function Tests: No results for input(s): "TSH", "T4TOTAL", "FREET4", "T3FREE", "THYROIDAB" in the last  72 hours. Anemia Panel: No results for input(s): "VITAMINB12", "FOLATE", "FERRITIN", "TIBC", "IRON", "RETICCTPCT" in the last 72 hours. Urine analysis:    Component Value Date/Time   COLORURINE YELLOW 09/16/2020 1256   APPEARANCEUR CLOUDY (A) 09/16/2020 1256   LABSPEC 1.011 09/16/2020 1256   PHURINE 6.0 09/16/2020 1256   GLUCOSEU NEGATIVE 09/16/2020 1256   GLUCOSEU NEGATIVE 07/30/2011 1440   HGBUR SMALL (A) 09/16/2020 1256   BILIRUBINUR NEGATIVE 09/16/2020 1256   KETONESUR NEGATIVE 09/16/2020 1256   PROTEINUR 100 (A) 09/16/2020 1256   UROBILINOGEN 0.2 09/30/2011 1824   NITRITE NEGATIVE 09/16/2020 1256   LEUKOCYTESUR LARGE (A) 09/16/2020 1256   Sepsis Labs: @LABRCNTIP (procalcitonin:4,lacticidven:4)  ) Recent Results (from the past 240 hours)  Blood Culture (routine x 2)     Status: None (Preliminary result)   Collection Time: 11/20/23 12:30 AM   Specimen: BLOOD  Result Value Ref Range Status   Specimen Description BLOOD RIGHT ANTECUBITAL  Final   Special Requests   Final    AEROBIC BOTTLE ONLY Blood Culture results may not be optimal due to an inadequate volume of blood received in culture bottles   Culture   Final    NO GROWTH 1 DAY Performed at [facility], [address] [place], [place] [address]    Report Status PENDING  Incomplete  Resp panel by RT-PCR (RSV, Flu A&B, Covid) Anterior Nasal Swab     Status: None   Collection Time: 11/20/23 12:35 AM   Specimen: Anterior Nasal Swab  Result Value Ref Range Status   SARS Coronavirus 2 by RT PCR NEGATIVE NEGATIVE Final   Influenza A by PCR NEGATIVE NEGATIVE Final   Influenza B by PCR NEGATIVE NEGATIVE Final    Comment: (NOTE) The Xpert Xpress SARS-CoV-2/FLU/RSV plus assay is intended as an aid in the diagnosis of influenza from Nasopharyngeal swab specimens and should not be used as a sole basis for treatment. Nasal washings and aspirates are unacceptable for Xpert Xpress SARS-CoV-2/FLU/RSV testing.  Fact  Sheet for Patients: BloggerCourse.com  Fact Sheet for Healthcare Providers: SeriousBroker.it  This test is not yet approved or cleared by the [place]  FDA and has been authorized for detection and/or diagnosis of SARS-CoV-2 by FDA under an Emergency Use Authorization (EUA). This EUA will remain in effect (meaning this test can be used) for the duration of the COVID-19 declaration under Section 564(b)(1) of the Act, 21 U.S.C. section 360bbb-3(b)(1), unless the authorization is terminated or revoked.     Resp Syncytial Virus by PCR NEGATIVE NEGATIVE Final    Comment: (NOTE) Fact Sheet for Patients: BloggerCourse.com  Fact Sheet for Healthcare Providers: SeriousBroker.it  This test is not yet approved or cleared by the [place]  FDA and has been authorized for detection and/or diagnosis of SARS-CoV-2 by FDA under an Emergency Use Authorization (EUA). This  EUA will remain in effect (meaning this test can be used) for the duration of the COVID-19 declaration under Section 564(b)(1) of the Act, 21 U.S.C. section 360bbb-3(b)(1), unless the authorization is terminated or revoked.  Performed at [facility], [address], [place], [place] [address]   Blood Culture (routine x 2)     Status: None (Preliminary result)   Collection Time: 11/20/23 12:35 AM   Specimen: BLOOD  Result Value Ref Range Status   Specimen Description BLOOD LEFT ANTECUBITAL  Final   Special Requests AEROBIC BOTTLE ONLY Blood Culture adequate volume  Final   Culture   Final    NO GROWTH 1 DAY Performed at [facility], [address] [place], [place] [address]    Report Status PENDING  Incomplete  MRSA Next Gen by PCR, Nasal     Status: None   Collection Time: 11/20/23  9:01 AM   Specimen: Nasal Mucosa; Nasal Swab  Result Value Ref Range Status   MRSA by PCR Next Gen NOT DETECTED NOT  DETECTED Final    Comment: (NOTE) The GeneXpert MRSA Assay (FDA approved for NASAL specimens only), is one component of a comprehensive MRSA colonization surveillance program. It is not intended to diagnose MRSA infection nor to guide or monitor treatment for MRSA infections. Test performance is not FDA approved in patients less than 78 years old. Performed at [facility], [address] [place], [place] [address]          Radiology Studies: CT HEAD WO CONTRAST ( ) Result Date: 11/20/2023 CLINICAL DATA:  Neuro deficit, acute, stroke suspected. EXAM: CT HEAD WITHOUT CONTRAST TECHNIQUE: Contiguous axial images were obtained from the base of the skull through the vertex without intravenous contrast. RADIATION DOSE REDUCTION: This exam was performed according to the departmental dose-optimization program which includes automated exposure control, adjustment of the mA and/or kV according to patient size and/or use of iterative reconstruction technique. COMPARISON:  CT head without contrast 09/18/2023. FINDINGS: Brain: Mild generalized atrophy and moderate white matter disease is stable. No acute infarct, hemorrhage, or mass lesion is present. Deep brain nuclei are within normal limits. The ventricles are proportionate to the degree of atrophy. Midline structures are within normal limits. The brainstem and cerebellum are within normal limits. Vascular: Atherosclerotic calcifications are present within the cavernous internal carotid arteries bilaterally and at the dural margin of both vertebral arteries. No hyperdense vessel is present. Skull: Calvarium is intact. No focal lytic or blastic lesions are present. No significant extracranial soft tissue lesion is present. Sinuses/Orbits: The paranasal sinuses and mastoid air cells are clear. The globes and orbits are within normal limits. IMPRESSION: 1. No acute intracranial abnormality or significant interval change. 2. Stable atrophy and white  matter disease. This likely reflects the sequela of chronic microvascular ischemia. Electronically Signed   By: Audree Leas M.D.   On: 11/20/2023 16:03   DG Chest Port 1 View Result Date: 11/20/2023 CLINICAL DATA:  Questionable sepsis - evaluate for abnormality EXAM: PORTABLE CHEST 1 VIEW COMPARISON:  Chest x-ray 09/18/2023 FINDINGS: Cardiomegaly. The heart and mediastinal contours are unchanged. Atherosclerotic plaque. Question developing retrocardiac airspace opacity. No pulmonary edema. Bilateral trace pleural effusions. No pneumothorax. No acute osseous abnormality. IMPRESSION: 1. Question developing retrocardiac airspace opacity. 2. Cardiomegaly with underlying pericardial effusion not excluded. 3. Bilateral trace pleural effusions. Electronically Signed   By: Morgane  Naveau M.D.   On: 11/20/2023 01:11        Scheduled Meds:  amLODipine  10 mg Oral Daily   calcitRIOL  0.5 mcg Oral Q M,W,F-2000   carvedilol  25 mg Oral BID WC   Chlorhexidine Gluconate Cloth  6 each Topical Q0600   cinacalcet  60 mg Oral Q M,W,F-1800   heparin  5,000 Units Subcutaneous Q8H   hydrALAZINE  25 mg Oral BID   sevelamer carbonate  1,600 mg Oral TID WC   sodium chloride flush  3 mL Intravenous Q12H   Continuous Infusions:  ceFEPime (MAXIPIME) IV 1 g (11/21/23 0534)   [START ON 11/23/2023] vancomycin Stopped (11/21/23 0402)   vancomycin       LOS: 1 day    Time spent: 55 minutes.    Fonnie Iba, MD  [facility] Pager #: [phone] 7PM-7AM contact night coverage as above

## 2023-11-22 DIAGNOSIS — J690 Pneumonitis due to inhalation of food and vomit: Secondary | ICD-10-CM | POA: Diagnosis not present

## 2023-11-22 LAB — RENAL FUNCTION PANEL
Albumin: 2.7 g/dL — ABNORMAL LOW (ref 3.5–5.0)
Anion gap: 16 — ABNORMAL HIGH (ref 5–15)
BUN: 61 mg/dL — ABNORMAL HIGH (ref 8–23)
CO2: 27 mmol/L (ref 22–32)
Calcium: 8.4 mg/dL — ABNORMAL LOW (ref 8.9–10.3)
Chloride: 90 mmol/L — ABNORMAL LOW (ref 98–111)
Creatinine, Ser: 7.98 mg/dL — ABNORMAL HIGH (ref 0.61–1.24)
GFR, Estimated: 7 mL/min — ABNORMAL LOW
Glucose, Bld: 180 mg/dL — ABNORMAL HIGH (ref 70–99)
Phosphorus: 5.6 mg/dL — ABNORMAL HIGH (ref 2.5–4.6)
Potassium: 4.2 mmol/L (ref 3.5–5.1)
Sodium: 133 mmol/L — ABNORMAL LOW (ref 135–145)

## 2023-11-22 LAB — CBC WITH DIFFERENTIAL/PLATELET
Abs Immature Granulocytes: 0.02 10*3/uL (ref 0.00–0.07)
Basophils Absolute: 0 10*3/uL (ref 0.0–0.1)
Basophils Relative: 1 %
Eosinophils Absolute: 0.1 10*3/uL (ref 0.0–0.5)
Eosinophils Relative: 2 %
HCT: 30.2 % — ABNORMAL LOW (ref 39.0–52.0)
Hemoglobin: 9.2 g/dL — ABNORMAL LOW (ref 13.0–17.0)
Immature Granulocytes: 1 %
Lymphocytes Relative: 14 %
Lymphs Abs: 0.6 10*3/uL — ABNORMAL LOW (ref 0.7–4.0)
MCH: 26.7 pg (ref 26.0–34.0)
MCHC: 30.5 g/dL (ref 30.0–36.0)
MCV: 87.5 fL (ref 80.0–100.0)
Monocytes Absolute: 0.4 10*3/uL (ref 0.1–1.0)
Monocytes Relative: 9 %
Neutro Abs: 3.3 10*3/uL (ref 1.7–7.7)
Neutrophils Relative %: 73 %
Platelets: 175 10*3/uL (ref 150–400)
RBC: 3.45 MIL/uL — ABNORMAL LOW (ref 4.22–5.81)
RDW: 16.7 % — ABNORMAL HIGH (ref 11.5–15.5)
WBC: 4.4 10*3/uL (ref 4.0–10.5)
nRBC: 0 % (ref 0.0–0.2)

## 2023-11-22 LAB — MAGNESIUM: Magnesium: 2.5 mg/dL — ABNORMAL HIGH (ref 1.7–2.4)

## 2023-11-22 MED ORDER — CHLORHEXIDINE GLUCONATE CLOTH 2 % EX PADS
6.0000 | MEDICATED_PAD | Freq: Every day | CUTANEOUS | Status: DC
  Administered 2023-11-22 – 2023-11-23 (×2): 6 via TOPICAL

## 2023-11-22 MED ORDER — POLYETHYLENE GLYCOL 3350 17 G PO PACK
17.0000 g | PACK | Freq: Every day | ORAL | Status: DC
  Administered 2023-11-22: 17 g via ORAL
  Filled 2023-11-22: qty 1

## 2023-11-22 NOTE — Progress Notes (Signed)
 PROGRESS NOTE    [name]  ZOX:[id number] DOB: August 14, 1950 DOA: 11/19/2023 PCP: Arcadio Knuckles, MD  Outpatient Specialists:     Brief Narrative:  As per H&P done on admission: "[name] is a 73 y.o. male with medical history significant of hypertension, hyperlipidemia, ESRD on HD, DM type II, seizure disorder, and anemia of chronic disease with reports of weakness.   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 He completed the course of antibiotics last week.      Yesterday experienced an acute onset of inability to move and generalized weakness that began last night. He was unable to get up or walk.  He had not noticed any fevers. No headaches, chest discomfort, trouble breathing, vomiting, or leg swelling. He reports a little coughing at home.   A follow-up appointment with neurology is scheduled for the 22nd of this month.   He is on a dialysis schedule of Monday, Wednesday, and Friday, with the last session completed on Wednesday, two days ago. No issues were reported during the last dialysis session.     In the emergency department patient was noted to be febrile up to 102 F with tachypnea meeting SIRS criteria.  Labs hemoglobin 9.9, potassium 5.4 BUN 60, creatinine 1.64, INR 1.2, and lactic acid 1.4.  Chest x-ray noted possible developing retrocardiac airspace opacity, cardiomegaly with pericardial effusion not excluded, and bilateral trace pleural effusions.  Influenza, COVID-19, RSV screening were negative.  Blood cultures were obtained.  Patient was given acetaminophen 1000 mg p.o., albuterol neb, sodium bicarb, vancomycin, metronidazole, and cefepime".   11/21/2023: Patient is a 73 year old African-American male with  past medical history significant for end-stage renal disease on hemodialysis, diabetes mellitus and hypertension.  Patient is not a particularly good historian, and tends to have difficulties recalling all the details.  Patient tells me that he had cadaveric kidney transplantation in 2015, by the transplanted kidney failed in 2022.  Patient has been on hemodialysis since then.  Patient has right upper extremity AV graft.  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  Patient has been admitted with possible sepsis secondary to pneumonia versus SIRS.  Patient was noted to be significantly weak.  Fever noted on presentation.  CT chest revealed consolidation left lower lobe.  CT head is nonrevealing.  Patient seems to be improving.  Nephrology input is appreciated.  As documented above, patient is not a particularly good historian.  Above history will need to be reconfirmed.    11/22/2023: Patient seen alongside patient's wife.  Patient looks a lot better today.  Will continue antibiotics for now.  Follow cultures.  Likely discharge tomorrow on oral antibiotics.  Assessment & Plan:   Principal Problem:   Sepsis (HCC) Active Problems:   Essential hypertension   Anemia   ESRD on dialysis (HCC)   Hyperkalemia   Generalized weakness   History of endocarditis   Seizure disorder (HCC)   Fever SIRS/sepsis secondary to possible community-acquired pneumonia -Patient presents with fever up to 102 F with weakness.  He was noted to be tachypneic meeting SIRS criteria.  Chest x-ray concerning for possible developing retrocardiac airspace opacity.  Blood cultures were obtained.  Patient had initially been started on empiric antibiotics of vancomycin, cefepime, and metronidazole.  Of note patient had just recently been hospitalized completing 6 weeks of antibiotics for staph epididymis bacteremia. -Follow cultures. -Continue IV antibiotics. -Low  threshold to consult the infectious disease team.   11/22/2023: Patient continues to improve.  Continue antibiotics for now.  Follow cultures.  Likely discharge on oral antibiotics tomorrow.   Hyperkalemia: -Resolved with dialysis.  ESRD on HD -Nephrology input is appreciated. -Further care as per nephrology team.  Generalized weakness Patient reports having generalized weakness for which she was unable to ambulate.  Global weakness noted on physical exam. - Neurochecks -CT head is nonrevealing.- PT/OT to evaluate and treat   Essential hypertension -Currently controlled.  History of endocarditis Patient had been been found to have Staph epidermidis bacteremia with concern for vegetation on TEE during hospitalization from 2/7-2/13/2025.  He was treated with IV Ancef until 10/29/23. -Low threshold to consult infectious disease team.   Possible seizure disorder During hospitalization 1/27-2/2 there had been concern for possible seizure activity.  EEG showed no evidence of seizures and MRI brain showed no focus for seizures.  He was discharged on Keppra 500 mg daily to be continued until follow-up, but follow-up appointment had not been scheduled until the 20th of this month and no refills were given.  - Resume Keppra  - Encouraged patient to keep follow-up appointment with neurology on the 20th of this month.   Anemia of chronic disease Hemoglobin 9.9 which appears around patient's baseline. -Continue to monitor  DVT prophylaxis: Subcutaneous heparin. Code Status: Full code. Family Communication:  Disposition Plan: Patient remains inpatient.   Consultants:  Nephrology.  Procedures:  None.  Antimicrobials:  IV cefepime. IV vancomycin.   Subjective: No fever or chills. The patient is more communicative today.  Objective: Vitals:   11/22/23 0440 11/22/23 0812 11/22/23 1025 11/22/23 1805  BP: 134/68 (!) 113/56 121/66 120/64  Pulse: 72 76  71  Resp: 18   18  Temp: 98.9  F (37.2 C)   98.3 F (36.8 C)  TempSrc: Oral   Oral  SpO2: 99% 91%  100%  Weight:      Height:        Intake/Output Summary (Last 24 hours) at 11/22/2023 1931 Last data filed at 11/22/2023 1400 Gross per 24 hour  Intake 590 ml  Output --  Net 590 ml   Filed Weights   11/20/23 0010 11/20/23 2324 11/21/23 0346  Weight: 73 kg 71.2 kg 69.6 kg    Examination:  General exam: Appears calm and comfortable.  Patient is a lot better today.  Patient is more communicative today.   Respiratory system: Intermittent wheezing.  Adequate air entry.  Cardiovascular system: S1 & S2 heard Gastrointestinal system: Abdomen is soft and nontender. Central nervous system: Awake and alert.  Poor historian.   Extremities: Mild bilateral ankle edema.  Data Reviewed: I have personally reviewed following labs and imaging studies  CBC: Recent Labs  Lab 11/20/23 0029 11/21/23 0903 11/22/23 0853  WBC 8.9 3.9* 4.4  NEUTROABS 7.3 2.6 3.3  HGB 9.9* 9.5* 9.2*  HCT 32.3* 30.7* 30.2*  MCV 89.7 87.7 87.5  PLT 171 142* 175   Basic Metabolic Panel: Recent Labs  Lab 11/20/23 0029 11/21/23 0903 11/22/23 0853  NA 135 135 133*  K 5.4* 4.3 4.2  CL 93* 92* 90*  CO2 28 27 27   GLUCOSE 108* 82 180*  BUN 60* 33* 61*  CREATININE 7.64* 5.39* 7.98*  CALCIUM 8.8* 8.7* 8.4*  MG  --   --  2.5*  PHOS  --  4.2 5.6*   GFR: Estimated Creatinine Clearance: 8.5 mL/min (A) (by C-G formula based on SCr of 7.98 mg/dL (H)). Liver Function Tests: Recent Labs  Lab 11/20/23 0029 11/21/23 0903 11/22/23 0853  AST 34  --   --   ALT 16  --   --   ALKPHOS 83  --   --   BILITOT 0.9  --   --   PROT 7.9  --   --   ALBUMIN 3.0* 2.7* 2.7*   No results for input(s): "LIPASE", "AMYLASE" in the last 168 hours. No results for input(s): "AMMONIA" in the last 168 hours. Coagulation Profile: Recent Labs  Lab 11/20/23 0029  INR 1.2   Cardiac Enzymes: No results for input(s): "CKTOTAL", "CKMB", "CKMBINDEX", "TROPONINI"  in the last 168 hours. BNP (last 3 results) No results for input(s): "PROBNP" in the last 8760 hours. HbA1C: No results for input(s): "HGBA1C" in the last 72 hours. CBG: No results for input(s): "GLUCAP" in the last 168 hours. Lipid Profile: No results for input(s): "CHOL", "HDL", "LDLCALC", "TRIG", "CHOLHDL", "LDLDIRECT" in the last 72 hours. Thyroid Function Tests: No results for input(s): "TSH", "T4TOTAL", "FREET4", "T3FREE", "THYROIDAB" in the last 72 hours. Anemia Panel: No results for input(s): "VITAMINB12", "FOLATE", "FERRITIN", "TIBC", "IRON", "RETICCTPCT" in the last 72 hours. Urine analysis:    Component Value Date/Time   COLORURINE YELLOW 09/16/2020 1256   APPEARANCEUR CLOUDY (A) 09/16/2020 1256   LABSPEC 1.011 09/16/2020 1256   PHURINE 6.0 09/16/2020 1256   GLUCOSEU NEGATIVE 09/16/2020 1256   GLUCOSEU NEGATIVE 07/30/2011 1440   HGBUR SMALL (A) 09/16/2020 1256   BILIRUBINUR NEGATIVE 09/16/2020 1256   KETONESUR NEGATIVE 09/16/2020 1256   PROTEINUR 100 (A) 09/16/2020 1256   UROBILINOGEN 0.2 09/30/2011 1824   NITRITE NEGATIVE 09/16/2020 1256   LEUKOCYTESUR LARGE (A) 09/16/2020 1256   Sepsis Labs: @LABRCNTIP (procalcitonin:4,lacticidven:4)  ) Recent Results (from the past 240 hours)  Blood Culture (routine x 2)     Status: None (Preliminary result)   Collection Time: 11/20/23 12:30 AM   Specimen: BLOOD  Result Value Ref Range Status   Specimen Description BLOOD RIGHT ANTECUBITAL  Final   Special Requests   Final    AEROBIC BOTTLE ONLY Blood Culture results may not be optimal due to an inadequate volume of blood received in culture bottles   Culture   Final    NO GROWTH 2 DAYS Performed at [facility], [address] [place], [place] [address]    Report Status PENDING  Incomplete  Resp panel by RT-PCR (RSV, Flu A&B, Covid) Anterior Nasal Swab     Status: None   Collection Time: 11/20/23 12:35 AM   Specimen: Anterior Nasal Swab  Result Value Ref Range  Status   SARS Coronavirus 2 by RT PCR NEGATIVE NEGATIVE Final   Influenza A by PCR NEGATIVE NEGATIVE Final   Influenza B by PCR NEGATIVE NEGATIVE Final    Comment: (NOTE) The Xpert Xpress SARS-CoV-2/FLU/RSV plus assay is intended as an aid in the diagnosis of influenza from Nasopharyngeal swab specimens and should not be used as a sole basis for treatment. Nasal washings and aspirates are unacceptable for Xpert Xpress SARS-CoV-2/FLU/RSV testing.  Fact Sheet for Patients: BloggerCourse.com  Fact Sheet for Healthcare Providers: SeriousBroker.it  This test is not yet approved or cleared by the [place]  FDA and has been authorized for detection and/or diagnosis of SARS-CoV-2 by FDA under an Emergency Use Authorization (EUA). This EUA will remain in  effect (meaning this test can be used) for the duration of the COVID-19 declaration under Section 564(b)(1) of the Act, 21 U.S.C. section 360bbb-3(b)(1), unless the authorization is terminated or revoked.     Resp Syncytial Virus by PCR NEGATIVE NEGATIVE Final    Comment: (NOTE) Fact Sheet for Patients: BloggerCourse.com  Fact Sheet for Healthcare Providers: SeriousBroker.it  This test is not yet approved or cleared by the [place]  FDA and has been authorized for detection and/or diagnosis of SARS-CoV-2 by FDA under an Emergency Use Authorization (EUA). This EUA will remain in effect (meaning this test can be used) for the duration of the COVID-19 declaration under Section 564(b)(1) of the Act, 21 U.S.C. section 360bbb-3(b)(1), unless the authorization is terminated or revoked.  Performed at [facility], [address] [place], [place] [address]   Blood Culture (routine x 2)     Status: None (Preliminary result)   Collection Time: 11/20/23 12:35 AM   Specimen: BLOOD  Result Value Ref Range Status   Specimen  Description BLOOD LEFT ANTECUBITAL  Final   Special Requests AEROBIC BOTTLE ONLY Blood Culture adequate volume  Final   Culture   Final    NO GROWTH 2 DAYS Performed at [facility], [address], [place], [place] [address]    Report Status PENDING  Incomplete  MRSA Next Gen by PCR, Nasal     Status: None   Collection Time: 11/20/23  9:01 AM   Specimen: Nasal Mucosa; Nasal Swab  Result Value Ref Range Status   MRSA by PCR Next Gen NOT DETECTED NOT DETECTED Final    Comment: (NOTE) The GeneXpert MRSA Assay (FDA approved for NASAL specimens only), is one component of a comprehensive MRSA colonization surveillance program. It is not intended to diagnose MRSA infection nor to guide or monitor treatment for MRSA infections. Test performance is not FDA approved in patients less than 86 years old. Performed at [facility], [address], [place], [place] [address]          Radiology Studies: No results found.       Scheduled Meds:  amLODipine  10 mg Oral Daily   calcitRIOL  0.5 mcg Oral Q M,W,F-2000   carvedilol  25 mg Oral BID WC   Chlorhexidine Gluconate Cloth  6 each Topical Q0600   Chlorhexidine Gluconate Cloth  6 each Topical Q0600   cinacalcet  60 mg Oral Q M,W,F-1800   heparin  5,000 Units Subcutaneous Q8H   hydrALAZINE  25 mg Oral BID   polyethylene glycol  17 g Oral Daily   sevelamer carbonate  1,600 mg Oral TID WC   sodium chloride flush  3 mL Intravenous Q12H   Continuous Infusions:  ceFEPime (MAXIPIME) IV 1 g (11/22/23 0017)   [START ON 11/23/2023] vancomycin Stopped (11/21/23 0402)   vancomycin       LOS: 2 days    Time spent: 35 minutes.    Fonnie Iba, MD  [facility] Pager #: [phone] 7PM-7AM contact night coverage as above

## 2023-11-22 NOTE — Plan of Care (Signed)
 ?  Problem: Education: ?Goal: Knowledge of General Education information will improve ?Description: Including pain rating scale, medication(s)/side effects and non-pharmacologic comfort measures ?Outcome: Progressing ?  ?Problem: Health Behavior/Discharge Planning: ?Goal: Ability to manage health-related needs will improve ?Outcome: Progressing ?  ?Problem: Clinical Measurements: ?Goal: Ability to maintain clinical measurements within normal limits will improve ?Outcome: Progressing ?Goal: Diagnostic test results will improve ?Outcome: Progressing ?  ?Problem: Activity: ?Goal: Risk for activity intolerance will decrease ?Outcome: Progressing ?  ?Problem: Nutrition: ?Goal: Adequate nutrition will be maintained ?Outcome: Progressing ?  ?

## 2023-11-22 NOTE — Plan of Care (Signed)
  Problem: Clinical Measurements: Goal: Ability to maintain clinical measurements within normal limits will improve Outcome: Progressing   Problem: Activity: Goal: Risk for activity intolerance will decrease Outcome: Progressing   Problem: Coping: Goal: Level of anxiety will decrease Outcome: Progressing   Problem: Safety: Goal: Ability to remain free from injury will improve Outcome: Progressing   Problem: Skin Integrity: Goal: Risk for impaired skin integrity will decrease Outcome: Progressing   

## 2023-11-22 NOTE — Progress Notes (Signed)
 [facility] Progress Note   Subjective:   Seated on bedside, feels like his weakness is a little bit better. Denies SOB, CP, dizziness, edema.   Objective Vitals:   11/21/23 0720 11/21/23 1449 11/21/23 1936 11/22/23 0440  BP: 136/67 111/63 121/66 134/68  Pulse: 74 72 76 72  Resp: 17  18 18   Temp: 99.6 F (37.6 C) 98.5 F (36.9 C) 99.5 F (37.5 C) 98.9 F (37.2 C)  TempSrc: Oral Oral Oral Oral  SpO2: 96% 96% 97% 99%  Weight:      Height:       Physical Exam General: Alert male in NAD Heart: RRR, no murmurs Lungs: Respirations unlabored on RA Abdomen: Non-distended Extremities: No edema b/l lower extremities Dialysis Access: AVG + t/b  Additional Objective Labs: Basic Metabolic Panel: Recent Labs  Lab 11/20/23 0029 11/21/23 0903  NA 135 135  K 5.4* 4.3  CL 93* 92*  CO2 28 27  GLUCOSE 108* 82  BUN 60* 33*  CREATININE 7.64* 5.39*  CALCIUM 8.8* 8.7*  PHOS  --  4.2   Liver Function Tests: Recent Labs  Lab 11/20/23 0029 11/21/23 0903  AST 34  --   ALT 16  --   ALKPHOS 83  --   BILITOT 0.9  --   PROT 7.9  --   ALBUMIN 3.0* 2.7*   No results for input(s): "LIPASE", "AMYLASE" in the last 168 hours. CBC: Recent Labs  Lab 11/20/23 0029 11/21/23 0903  WBC 8.9 3.9*  NEUTROABS 7.3 2.6  HGB 9.9* 9.5*  HCT 32.3* 30.7*  MCV 89.7 87.7  PLT 171 142*   Blood Culture    Component Value Date/Time   SDES BLOOD LEFT ANTECUBITAL 11/20/2023 0035   SPECREQUEST AEROBIC BOTTLE ONLY Blood Culture adequate volume 11/20/2023 0035   CULT  11/20/2023 0035    NO GROWTH 2 DAYS Performed at [facility], [address], [place], [place] [address]    REPTSTATUS PENDING 11/20/2023 3244    Cardiac Enzymes: No results for input(s): "CKTOTAL", "CKMB", "CKMBINDEX", "TROPONINI" in the last 168 hours. CBG: No results for input(s): "GLUCAP" in the last 168 hours. Iron Studies: No results for input(s): "IRON", "TIBC", "TRANSFERRIN", "FERRITIN" in the last  72 hours. @lablastinr3 @ Studies/Results: CT CHEST WO CONTRAST Result Date: 11/21/2023 CLINICAL DATA:  Respiratory illness. EXAM: CT CHEST WITHOUT CONTRAST TECHNIQUE: Multidetector CT imaging of the chest was performed following the standard protocol without IV contrast. RADIATION DOSE REDUCTION: This exam was performed according to the departmental dose-optimization program which includes automated exposure control, adjustment of the mA and/or kV according to patient size and/or use of iterative reconstruction technique. COMPARISON:  Chest radiograph same day FINDINGS: Cardiovascular: Small pericardial effusion present. Effusion measures 10 mm in thickness anterior to the RIGHT heart. Coronary artery calcification and aortic atherosclerotic calcification. There is venous collaterals along the RIGHT anterior chest wall. Mediastinum/Nodes: Multiple small mediastinal lymph nodes nodes are not pathologically enlarged but concerning in the number of nodes present. Lungs/Pleura: Bilateral small layering pleural effusions. Mild consolidation in the LEFT lower lobe (image 104/4) Upper Abdomen: Limited view of the liver, kidneys, pancreas are unremarkable. Normal adrenal glands. 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 Small pericardial effusion. 2. Small bilateral pleural effusions. 3. Mild consolidation in the LEFT lower lobe suggest mild pneumonia or aspiration. 4. Multiple mediastinal lymph nodes are favored reactive. 5. 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 Electronically Signed   By: Annette Barters  Dortha Gauss M.D.   On: 11/21/2023 11:29   CT HEAD WO CONTRAST ( ) Result Date: 11/20/2023 CLINICAL DATA:  Neuro deficit, acute, stroke suspected. EXAM: CT HEAD WITHOUT CONTRAST TECHNIQUE: Contiguous axial images were obtained from the base of the skull through the vertex without intravenous contrast. RADIATION DOSE REDUCTION: This exam was  performed according to the departmental dose-optimization program which includes automated exposure control, adjustment of the mA and/or kV according to patient size and/or use of iterative reconstruction technique. COMPARISON:  CT head without contrast 09/18/2023. FINDINGS: Brain: Mild generalized atrophy and moderate white matter disease is stable. No acute infarct, hemorrhage, or mass lesion is present. Deep brain nuclei are within normal limits. The ventricles are proportionate to the degree of atrophy. Midline structures are within normal limits. The brainstem and cerebellum are within normal limits. Vascular: Atherosclerotic calcifications are present within the cavernous internal carotid arteries bilaterally and at the dural margin of both vertebral arteries. No hyperdense vessel is present. Skull: Calvarium is intact. No focal lytic or blastic lesions are present. No significant extracranial soft tissue lesion is present. Sinuses/Orbits: The paranasal sinuses and mastoid air cells are clear. The globes and orbits are within normal limits. IMPRESSION: 1. No acute intracranial abnormality or significant interval change. 2. Stable atrophy and white matter disease. This likely reflects the sequela of chronic microvascular ischemia. Electronically Signed   By: Audree Leas M.D.   On: 11/20/2023 16:03   Medications:  ceFEPime (MAXIPIME) IV 1 g (11/22/23 0017)   [START ON 11/23/2023] vancomycin Stopped (11/21/23 0402)   vancomycin      amLODipine  10 mg Oral Daily   calcitRIOL  0.5 mcg Oral Q M,W,F-2000   carvedilol  25 mg Oral BID WC   Chlorhexidine Gluconate Cloth  6 each Topical Q0600   cinacalcet  60 mg Oral Q M,W,F-1800   heparin  5,000 Units Subcutaneous Q8H   hydrALAZINE  25 mg Oral BID   sevelamer carbonate  1,600 mg Oral TID WC   sodium chloride flush  3 mL Intravenous Q12H    OP Dialysis Orders: Center: [facility]  on MWF . MWF 180Nre 4 hours BFR 400 DFR 800 EDW 68.8kg 2K 2Ca  AVG 15g Heparin 4000 unit bolus  Mircera 150mcg IV q 2 weeks- last dose 11/18/23 Venofer 100mg  IV q HD-stop date 11/23/23 Sensipar 60mg  PO q HD  Assessment/Plan: Weakness/fever: Work up per admitting team, on empiric antibiotics  ESRD:  On MWF schedule, HD Monday per regular schedule  Hypertension/volume: BP controlled, appears euvolemic on exam. Has been reaching EDW. Continue home BP meds  Anemia: Hgb 9.5, not due for ESA yet, received a few doses of venofer but will hold today with ongoing infection work up  Metabolic bone disease: Calcium at goal, follow phos. Continue sensipar  Nutrition:  Renal diet with fluid restrictions  Ramona Burner, PA-C 11/22/2023, 8:44 AM  [facility] Pager: [phone]

## 2023-11-23 DIAGNOSIS — R6520 Severe sepsis without septic shock: Secondary | ICD-10-CM

## 2023-11-23 DIAGNOSIS — A419 Sepsis, unspecified organism: Secondary | ICD-10-CM | POA: Diagnosis not present

## 2023-11-23 LAB — RENAL FUNCTION PANEL
Albumin: 2.5 g/dL — ABNORMAL LOW (ref 3.5–5.0)
Anion gap: 16 — ABNORMAL HIGH (ref 5–15)
BUN: 80 mg/dL — ABNORMAL HIGH (ref 8–23)
CO2: 22 mmol/L (ref 22–32)
Calcium: 8.6 mg/dL — ABNORMAL LOW (ref 8.9–10.3)
Chloride: 95 mmol/L — ABNORMAL LOW (ref 98–111)
Creatinine, Ser: 9.15 mg/dL — ABNORMAL HIGH (ref 0.61–1.24)
GFR, Estimated: 6 mL/min — ABNORMAL LOW (ref >60)
Glucose, Bld: 72 mg/dL (ref 70–99)
Phosphorus: 6.9 mg/dL — ABNORMAL HIGH (ref 2.5–4.6)
Potassium: 4.7 mmol/L (ref 3.5–5.1)
Sodium: 133 mmol/L — ABNORMAL LOW (ref 135–145)

## 2023-11-23 LAB — CBC
HCT: 28.3 % — ABNORMAL LOW (ref 39.0–52.0)
Hemoglobin: 9 g/dL — ABNORMAL LOW (ref 13.0–17.0)
MCH: 27.3 pg (ref 26.0–34.0)
MCHC: 31.8 g/dL (ref 30.0–36.0)
MCV: 85.8 fL (ref 80.0–100.0)
Platelets: 156 10*3/uL (ref 150–400)
RBC: 3.3 MIL/uL — ABNORMAL LOW (ref 4.22–5.81)
RDW: 16.7 % — ABNORMAL HIGH (ref 11.5–15.5)
WBC: 4.7 10*3/uL (ref 4.0–10.5)
nRBC: 0 % (ref 0.0–0.2)

## 2023-11-23 MED ORDER — POLYETHYLENE GLYCOL 3350 17 G PO PACK: 17.0000 g | PACK | Freq: Every day | ORAL | 0 refills | Status: AC

## 2023-11-23 MED ORDER — AMOXICILLIN-POT CLAVULANATE 500-125 MG PO TABS
1.0000 | ORAL_TABLET | Freq: Every evening | ORAL | 0 refills | Status: AC
Start: 2023-11-23 — End: 2023-11-28

## 2023-11-23 MED ORDER — CINACALCET HCL 30 MG PO TABS: 60.0000 mg | ORAL_TABLET | ORAL | 0 refills | Status: AC

## 2023-11-23 MED ORDER — DOXYCYCLINE HYCLATE 100 MG PO TABS: 100.0000 mg | ORAL_TABLET | Freq: Two times a day (BID) | ORAL | 0 refills | Status: AC

## 2023-11-23 MED ORDER — ACETAMINOPHEN 325 MG PO TABS: 650.0000 mg | ORAL_TABLET | Freq: Four times a day (QID) | ORAL | Status: DC | PRN

## 2023-11-23 NOTE — Progress Notes (Addendum)
 Pt receives out-pt HD at [facility] on MWF. Will assist as needed.   Lauraine Polite Renal Navigator [phone]  Addendum at 3:45 pm: D/C order noted. Contacted Harlen Lick to be advised of pt's d/c today and that pt should resume care on Wednesday.

## 2023-11-23 NOTE — Progress Notes (Addendum)
 Inpatient Rehab Admissions Coordinator:   CIR consult received. Pt. Was screened yesterday and it was felt she does not have medical necessity for CIR. This is unchanged. I also do not believe Pt.'s payor will approve CIR admit for his diagnoses. I will discontinue consult order.   Wandalee Gust, MS, CCC-SLP Rehab Admissions Coordinator  [phone] (celll) [phone] (office)

## 2023-11-23 NOTE — Plan of Care (Signed)

## 2023-11-23 NOTE — Discharge Planning (Signed)
 [facility] Patient Discharge Orders- [facility] CLINIC: [name]  Patient's name: [name] Admit/DC Dates: 11/19/2023 - 11/23/2023  Discharge Diagnoses: Weakness/fever     Aranesp: Given: No   Date and amount of last dose: NA  Last Hgb: 9.0 PRBC's Given: No Date/# of units: NA ESA dose for discharge: mircera 150 mcg IV q 2 weeks  IV Iron dose at discharge: Continue venofer load  Heparin change: No  EDW Change: Yes  New EDW: 67.8 kg  Bath Change: No  Access intervention/Change: No Details:  [name]: No  Discharge Labs: Calcium 8.6 Phosphorus 6.9 Albumin 2.5 K+ 4.7  IV Antibiotics: No IV meds. On oral ABX Details:  On Coumadin?: NO Last INR: Next INR: Managed By:   OTHER/APPTS/LAB ORDERS:    D/C Meds to be reconciled by nurse after every discharge.  Completed By: [name] [facility] [phone]   Reviewed by: MD:______ RN_______

## 2023-11-23 NOTE — Progress Notes (Signed)
 [facility] Progress Note   Subjective: Seen on HD. No C/Os.    Objective Vitals:   11/23/23 0805 11/23/23 0810 11/23/23 0819 11/23/23 0830  BP:  129/70 139/86 121/69  Pulse:  70 69 70  Resp:  18 16 17   Temp:  98.5 F (36.9 C)    TempSrc:      SpO2:  100% 100% 99%  Weight: 68.9 kg     Height:       Physical Exam General: pleasant older male in NAD Heart: S1,S2 RRR  Lungs: CTAB no WOB Abdomen: NABS, NT Extremities:No LE edema Dialysis Access: AVG cannulated  Additional Objective Labs: Basic Metabolic Panel: Recent Labs  Lab 11/21/23 0903 11/22/23 0853 11/23/23 0306  NA 135 133* 133*  K 4.3 4.2 4.7  CL 92* 90* 95*  CO2 27 27 22   GLUCOSE 82 180* 72  BUN 33* 61* 80*  CREATININE 5.39* 7.98* 9.15*  CALCIUM 8.7* 8.4* 8.6*  PHOS 4.2 5.6* 6.9*   Liver Function Tests: Recent Labs  Lab 11/20/23 0029 11/21/23 0903 11/22/23 0853 11/23/23 0306  AST 34  --   --   --   ALT 16  --   --   --   ALKPHOS 83  --   --   --   BILITOT 0.9  --   --   --   PROT 7.9  --   --   --   ALBUMIN 3.0* 2.7* 2.7* 2.5*   No results for input(s): "LIPASE", "AMYLASE" in the last 168 hours. CBC: Recent Labs  Lab 11/20/23 0029 11/21/23 0903 11/22/23 0853  WBC 8.9 3.9* 4.4  NEUTROABS 7.3 2.6 3.3  HGB 9.9* 9.5* 9.2*  HCT 32.3* 30.7* 30.2*  MCV 89.7 87.7 87.5  PLT 171 142* 175   Blood Culture    Component Value Date/Time   SDES BLOOD LEFT ANTECUBITAL 11/20/2023 0035   SPECREQUEST AEROBIC BOTTLE ONLY Blood Culture adequate volume 11/20/2023 0035   CULT  11/20/2023 0035    NO GROWTH 3 DAYS Performed at [facility], [address], [place], [place] [address]    REPTSTATUS PENDING 11/20/2023 2956    Cardiac Enzymes: No results for input(s): "CKTOTAL", "CKMB", "CKMBINDEX", "TROPONINI" in the last 168 hours. CBG: No results for input(s): "GLUCAP" in the last 168 hours. Iron Studies: No results for input(s): "IRON", "TIBC", "TRANSFERRIN", "FERRITIN" in the  last 72 hours. @lablastinr3 @ Studies/Results: No results found. Medications:  ceFEPime (MAXIPIME) IV 1 g (11/23/23 0002)   vancomycin Stopped (11/21/23 0402)   vancomycin      amLODipine  10 mg Oral Daily   calcitRIOL  0.5 mcg Oral Q M,W,F-2000   carvedilol  25 mg Oral BID WC   Chlorhexidine Gluconate Cloth  6 each Topical Q0600   Chlorhexidine Gluconate Cloth  6 each Topical Q0600   cinacalcet  60 mg Oral Q M,W,F-1800   heparin  5,000 Units Subcutaneous Q8H   hydrALAZINE  25 mg Oral BID   polyethylene glycol  17 g Oral Daily   sevelamer carbonate  1,600 mg Oral TID WC   sodium chloride flush  3 mL Intravenous Q12H     OP Dialysis Orders: Center: [facility]  on MWF . MWF 180Nre 4 hours BFR 400 DFR 800 EDW 68.8kg 2K 2Ca AVG 15g Heparin 4000 unit bolus  Mircera IV q 2 weeks- last dose 11/18/23 Venofer 100mg  IV q HD-stop date 11/23/23 Sensipar 60mg  PO q HD   Assessment/Plan: Weakness/fever: Work up  per admitting team, on empiric antibiotics  ESRD:  On MWF schedule, HD Monday per regular schedule. Next HD 11/25/2023  Hypertension/volume: BP controlled, appears euvolemic on exam. Has been reaching EDW. Continue home BP meds  Anemia: Hgb 9.0 not due for ESA yet, received a few doses of venofer but will hold today with ongoing infection work up  Metabolic bone disease: Calcium at goal, follow phos. Continue sensipar  Nutrition:  Renal diet with fluid restrictions  Jaeley Wiker H. Mychelle Kendra NP-C 11/23/2023, 9:10 AM  [employer] [phone]

## 2023-11-23 NOTE — Plan of Care (Signed)
  Problem: Education: Goal: Knowledge of General Education information will improve Description: Including pain rating scale, medication(s)/side effects and non-pharmacologic comfort measures Outcome: Progressing   Problem: Health Behavior/Discharge Planning: Goal: Ability to manage health-related needs will improve Outcome: Progressing   Problem: Clinical Measurements: Goal: Ability to maintain clinical measurements within normal limits will improve Outcome: Progressing Goal: Diagnostic test results will improve Outcome: Progressing Goal: Respiratory complications will improve Outcome: Progressing   Problem: Activity: Goal: Risk for activity intolerance will decrease Outcome: Progressing   Problem: Nutrition: Goal: Adequate nutrition will be maintained Outcome: Progressing   

## 2023-11-25 LAB — CULTURE, BLOOD (ROUTINE X 2)
Culture: NO GROWTH
Culture: NO GROWTH
Special Requests: ADEQUATE

## 2023-12-01 ENCOUNTER — Ambulatory Visit: Payer: Medicare Other | Admitting: Neurology

## 2023-12-01 ENCOUNTER — Encounter: Payer: Self-pay | Admitting: Neurology

## 2023-12-01 VITALS — BP 144/61 | HR 72 | Ht 70.0 in | Wt 155.5 lb

## 2023-12-01 DIAGNOSIS — G629 Polyneuropathy, unspecified: Secondary | ICD-10-CM

## 2023-12-01 DIAGNOSIS — N186 End stage renal disease: Secondary | ICD-10-CM

## 2023-12-01 DIAGNOSIS — R569 Unspecified convulsions: Secondary | ICD-10-CM

## 2023-12-01 DIAGNOSIS — Z992 Dependence on renal dialysis: Secondary | ICD-10-CM | POA: Diagnosis not present

## 2023-12-01 MED ORDER — GABAPENTIN 100 MG PO CAPS: 100.0000 mg | ORAL_CAPSULE | Freq: Every day | ORAL | 0 refills | Status: DC

## 2023-12-01 NOTE — Progress Notes (Signed)
 [facility]  PATIENT: [name] DOB: 09/11/1950  REQUESTING CLINICIAN: Arleene Lack, MD HISTORY FROM: Patient/Daughter  REASON FOR VISIT: Seizure during dialysis   HISTORICAL  CHIEF COMPLAINT:  Chief Complaint  Patient presents with   New Patient (Initial Visit)    Rm12, daughter present,NP internal referral for Seizure: last episode was in mid feb. Pt just finished meds for pneumonia    HISTORY OF PRESENT ILLNESS:  This is 73 year old gentleman past medical history of end-stage renal disease on dialysis Monday Wednesday Friday, hypertension, hyperlipidemia who is presenting after a seizure on January 27.  Per chart review, patient missed dialysis for about 11 days.  During dialysis, 2-1/2-hour into the dialysis session, he stopped responding and having rhythmic jerking of the right upper extremity.  He was taken to the ED where he received Versed  and became hypotensive to the 60s over 40s desaturated to the 70s.  He did not require intubation.  He was loaded with Keppra  and now hooked to EEG.  EEG showed diffuse slowing with generalized discharges with triphasic morphology consistent with metabolic encephalopathy.  Patient was discharged on Keppra  500 mg twice daily but represented to the ED on February 7 for influenza and pneumonia and upon discharge, Keppra  was discontinued.  Currently he is not taking Keppra .  He tells me that he never had a seizure in the past, and none since.  His main complaint is numbness and tingling in his extremity, states sometimes it is painful, but intermittent.   OTHER MEDICAL CONDITIONS: ESRD on dialysis, Hypertension,   REVIEW OF SYSTEMS: Full 14 system review of systems performed and negative with exception of: As noted in the HPI    ALLERGIES: No Known Allergies  HOME MEDICATIONS: Outpatient Medications Prior to Visit  Medication Sig Dispense Refill   acetaminophen  (TYLENOL ) 325 MG tablet Take 2 tablets (650 mg  total) by mouth every 6 (six) hours as needed for mild pain (pain score 1-3) (or Fever >/= 101).     amLODipine  (NORVASC ) 10 MG tablet Take 10 mg by mouth daily.     calcitRIOL  (ROCALTROL ) 0.5 MCG capsule Take 0.5 mcg by mouth See admin instructions. Take one tablet by mouth three times a week on Monday, Wednesday and Fridays per wife     carvedilol  (COREG ) 25 MG tablet Take 25 mg by mouth 2 (two) times daily with a meal.     cinacalcet  (SENSIPAR ) 30 MG tablet Take 2 tablets (60 mg total) by mouth every Monday, Wednesday, and Friday at 6 PM. 60 tablet 0   hydrALAZINE  (APRESOLINE ) 25 MG tablet Take 1 tablet (25 mg total) by mouth 2 (two) times daily. 30 tablet 0   polyethylene glycol (MIRALAX  / GLYCOLAX ) 17 g packet Take 17 g by mouth daily. 14 each 0   sevelamer  carbonate (RENVELA ) 800 MG tablet Take 1,600 mg by mouth 3 (three) times daily with meals.     No facility-administered medications prior to visit.    PAST MEDICAL HISTORY: Past Medical History:  Diagnosis Date   Anemia    Anemia    Benign colon polyp 08/11/2010   Clot    STATED TOLD IN APRIL CLOT TO LEFT SHOULDER. 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  HTN (hypertension)    Hyperlipidemia    Seizures (HCC)     PAST SURGICAL HISTORY: Past Surgical History:  Procedure Laterality Date   AV FISTULA PLACEMENT  01/19/2012   Procedure: ARTERIOVENOUS (AV) FISTULA CREATION;  Surgeon: Arvil Lauber, MD;  Location[facility] OR;  Service: Vascular;  Laterality: Right;  Right Brachio-cephalic arteriovenous fistula   AV FISTULA PLACEMENT Right 07/18/2021   Procedure: INSERTION OF RIGHT UPPER EXTREMITY ARTERIOVENOUS (AV) GORE-TEX GRAFT;  Surgeon: Carlene Che, MD;  Location: [facility] OR;  Service: Vascular;  Laterality: Right;  PERIPHERAL NERVE BLOCK   AV  FISTULA PLACEMENT, RADIOCEPHALIC  12/31/10   Right arm   INSERTION OF DIALYSIS CATHETER  01/19/2012   Procedure: INSERTION OF DIALYSIS CATHETER;  Surgeon: Arvil Lauber, MD;  Location: [facility] OR;  Service: Vascular;  Laterality: N/A;  right internal jugular vein   TRANSESOPHAGEAL ECHOCARDIOGRAM (CATH LAB) N/A 09/23/2023   Procedure: TRANSESOPHAGEAL ECHOCARDIOGRAM;  Surgeon: Jann Melody, MD;  Location: [facility] INVASIVE CV LAB;  Service: Cardiovascular;  Laterality: N/A;   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   UPPER EXTREMITY VENOGRAPHY Right 06/11/2021   Procedure: UPPER EXTREMITY VENOGRAPHY;  Surgeon: Margherita Shell, MD;  Location: [facility] INVASIVE CV LAB;  Service: Cardiovascular;  Laterality: Right;   VENOGRAM Left 05/30/2021   Procedure: DIAGNOSTIC CENTRAL VENOGRAM;  Surgeon: Kayla Part, MD;  Location: [facility] OR;  Service: Vascular;  Laterality: Left;    FAMILY HISTORY: Family History  Problem Relation Age of Onset   Diabetes Father    Colon cancer Neg Hx    Esophageal cancer Neg Hx    Stomach cancer Neg Hx    Rectal cancer Neg Hx     SOCIAL HISTORY: Social History   Socioeconomic History   Marital status: Married    Spouse name: cordellia   Number of children: 5   Years of education: Not on file   Highest education level: Doctorate  Occupational History   Occupation: PhD Garment/textile technologist: [employer]  Tobacco Use   Smoking status: Never    Passive exposure: Never   Smokeless tobacco: Never  Vaping Use   Vaping status: Never Used  Substance and Sexual Activity   Alcohol use: No    Comment: 1 drink a month or non   Drug use: No   Sexual activity: Yes    Birth control/protection: Condom  Other Topics Concern   Not on file  Social History Narrative   Regular Exercise -  YES         Social Drivers of Health   Financial Resource Strain: Not on file  Food Insecurity: No Food Insecurity (11/20/2023)   Hunger Vital Sign    Worried About Running Out of Food in the  Last Year: Never true    Ran Out of Food in the Last Year: Never true  Transportation Needs: No Transportation Needs (11/20/2023)   PRAPARE - Administrator, Civil Service (Medical): No    Lack of Transportation (Non-Medical): No  Recent Concern: Transportation Needs - Unmet Transportation Needs (09/21/2023)   PRAPARE - Administrator, Civil Service (Medical): Yes    Lack of Transportation (Non-Medical): No  Physical Activity: Not on file  Stress: Not on file  Social Connections: Socially Integrated (11/20/2023)   Social Connection and Isolation Panel [NHANES]    Frequency of Communication with Friends and Family: More than three times a week    Frequency of Social Gatherings with Friends and  Family: Three times a week    Attends Religious Services: More than 4 times per year    Active Member of Clubs or Organizations: Yes    Attends Banker Meetings: More than 4 times per year    Marital Status: Married  Catering manager Violence: Not At Risk (11/20/2023)   Humiliation, Afraid, Rape, and Kick questionnaire    Fear of Current or Ex-Partner: No    Emotionally Abused: No    Physically Abused: No    Sexually Abused: No    PHYSICAL EXAM  GENERAL EXAM/CONSTITUTIONAL: Vitals:  Vitals:   12/01/23 1316 12/01/23 1317  BP: (!) 153/64 (!) 144/61  Pulse: 72   Weight: 155 lb 8 oz (70.5 kg)   Height: 5\' 10"  (1.778 m)    Body mass index is 22.31 kg/m. Wt Readings from Last 3 Encounters:  12/01/23 155 lb 8 oz (70.5 kg)  11/23/23 147 lb 7.8 oz (66.9 kg)  09/24/23 160 lb 0.9 oz (72.6 kg)   Patient is in no distress; well developed, nourished and groomed; neck is supple  MUSCULOSKELETAL: Gait, strength, tone, movements noted in Neurologic exam below  NEUROLOGIC: MENTAL STATUS:      No data to display         awake, alert, oriented to person, place and time recent and remote memory intact normal attention and concentration language fluent,  comprehension intact, naming intact fund of knowledge appropriate  CRANIAL NERVE:  2nd, 3rd, 4th, 6th - Visual fields full to confrontation, extraocular muscles intact, no nystagmus 5th - facial sensation symmetric 7th - facial strength symmetric 8th - hearing intact 9th - palate elevates symmetrically, uvula midline 11th - shoulder shrug symmetric 12th - tongue protrusion midline  MOTOR:  normal bulk and tone, full strength in the BUE, BLE  SENSORY:  normal and symmetric to light touch, pinprick, decrease vibration up to ankle   COORDINATION:  finger-nose-finger with mild action tremors left, worse than right  GAIT/STATION:  Uses a cane     DIAGNOSTIC DATA (LABS, IMAGING, TESTING) - I reviewed patient records, labs, notes, testing and imaging myself where available.  Lab Results  Component Value Date   WBC 4.7 11/23/2023   HGB 9.0 (L) 11/23/2023   HCT 28.3 (L) 11/23/2023   MCV 85.8 11/23/2023   PLT 156 11/23/2023      Component Value Date/Time   NA 133 (L) 11/23/2023 0306   NA 135 (A) 04/03/2020 0000   K 4.7 11/23/2023 0306   CL 95 (L) 11/23/2023 0306   CO2 22 11/23/2023 0306   GLUCOSE 72 11/23/2023 0306   BUN 80 (H) 11/23/2023 0306   BUN 53 (A) 03/29/2020 0000   CREATININE 9.15 (H) 11/23/2023 0306   CALCIUM  8.6 (L) 11/23/2023 0306   CALCIUM  7.0 (L) 09/08/2010 1950   PROT 7.9 11/20/2023 0029   ALBUMIN 2.5 (L) 11/23/2023 0306   AST 34 11/20/2023 0029   ALT 16 11/20/2023 0029   ALKPHOS 83 11/20/2023 0029   BILITOT 0.9 11/20/2023 0029   GFRNONAA 6 (L) 11/23/2023 0306   GFRAA 13 (L) 04/18/2020 1855   Lab Results  Component Value Date   CHOL 148 03/07/2020   HDL 51 03/07/2020   LDLCALC 92 03/07/2020   LDLDIRECT 75.0 05/18/2017   TRIG 81 03/07/2020   Lab Results  Component Value Date   HGBA1C 5.2 09/15/2023   Lab Results  Component Value Date   VITAMINB12 1,549 (H) 09/20/2023   Lab Results  Component Value Date  TSH 2.709 09/20/2023     Overnight EEG 09/08/2023 - Periodic discharges with triphasic morphology, generalized - Continuous slow, generalized   CT Head 11/20/2023 1. No acute intracranial abnormality or significant interval change. 2. Stable atrophy and white matter disease. This likely reflects the sequela of chronic microvascular ischemia    I personally reviewed brain Images and previous EEG reports.   ASSESSMENT AND PLAN  73 y.o. year old male  with end-stage renal disease on dialysis Monday Wednesday Friday, hypertension who is presenting after 1 seizure during dialysis.  This was in the setting of patient missing dialysis for 11 days, patient seizure likely provoked due to fluid shift/uremia.  He was on Keppra  500 mg twice daily but medication was discontinued back on February 7.  I would not restart patient on antiseizure medication, this was his only seizure, likely provoked.  Advised him to continue current medications, continue follow-up PCP and return if worse.  For his peripheral neuropathy, I will give him a low-dose gabapentin  to take as needed.   1. ESRD on dialysis (HCC)   2. Seizures (HCC)   3. Peripheral polyneuropathy     Patient Instructions  Continue current medications  Trial of Gabapentin  for pain associated with peripheral neuropathy  Continue to follow up with PCP  Return if worse    Per [place]  DMV statutes, patients with seizures are not allowed to drive until they have been seizure-free for six months.  Other recommendations include using caution when using heavy equipment or power tools. Avoid working on ladders or at heights. Take showers instead of baths.  Do not swim alone.  Ensure the water temperature is not too high on the home water heater. Do not go swimming alone. Do not lock yourself in a room alone (i.e. bathroom). When caring for infants or small children, sit down when holding, feeding, or changing them to minimize risk of injury to the child in the event you  have a seizure. Maintain good sleep hygiene. Avoid alcohol.  Also recommend adequate sleep, hydration, good diet and minimize stress.   During the Seizure  - First, ensure adequate ventilation and place patients on the floor on their left side  Loosen clothing around the neck and ensure the airway is patent. If the patient is clenching the teeth, do not force the mouth open with any object as this can cause severe damage - Remove all items from the surrounding that can be hazardous. The patient may be oblivious to what's happening and may not even know what he or she is doing. If the patient is confused and wandering, either gently guide him/her away and block access to outside areas - Reassure the individual and be comforting - Call 911. In most cases, the seizure ends before EMS arrives. However, there are cases when seizures may last over 3 to 5 minutes. Or the individual may have developed breathing difficulties or severe injuries. If a pregnant patient or a person with diabetes develops a seizure, it is prudent to call an ambulance. - Finally, if the patient does not regain full consciousness, then call EMS. Most patients will remain confused for about 45 to 90 minutes after a seizure, so you must use judgment in calling for help. - Avoid restraints but make sure the patient is in a bed with padded side rails - Place the individual in a lateral position with the neck slightly flexed; this will help the saliva drain from the mouth and prevent the tongue from  falling backward - Remove all nearby furniture and other hazards from the area - Provide verbal assurance as the individual is regaining consciousness - Provide the patient with privacy if possible - Call for help and start treatment as ordered by the caregiver   After the Seizure (Postictal Stage)  After a seizure, most patients experience confusion, fatigue, muscle pain and/or a headache. Thus, one should permit the individual to  sleep. For the next few days, reassurance is essential. Being calm and helping reorient the person is also of importance.  Most seizures are painless and end spontaneously. Seizures are not harmful to others but can lead to complications such as stress on the lungs, brain and the heart. Individuals with prior lung problems may develop labored breathing and respiratory distress.    Discussed Patients with epilepsy have a small risk of sudden unexpected death, a condition referred to as sudden unexpected death in epilepsy (SUDEP). SUDEP is defined specifically as the sudden, unexpected, witnessed or unwitnessed, nontraumatic and nondrowning death in patients with epilepsy with or without evidence for a seizure, and excluding documented status epilepticus, in which post mortem examination does not reveal a structural or toxicologic cause for death     No orders of the defined types were placed in this encounter.   Meds ordered this encounter  Medications   gabapentin  (NEURONTIN ) 100 MG capsule    Sig: Take 1 capsule (100 mg total) by mouth at bedtime.    Dispense:  30 capsule    Refill:  0    Return if symptoms worsen or fail to improve.    Cassandra Cleveland, MD 12/01/2023, 4:52 PM  [facility] [address], [address] [place], [place] [address] [phone]

## 2023-12-01 NOTE — Patient Instructions (Signed)
 Continue current medications  Trial of Gabapentin  for pain associated with peripheral neuropathy  Continue to follow up with PCP  Return if worse

## 2023-12-10 ENCOUNTER — Telehealth: Payer: Self-pay | Admitting: Neurology

## 2023-12-10 NOTE — Telephone Encounter (Signed)
 Please have patient discontinue medication, he can try over the counter lidocaine  cream

## 2023-12-10 NOTE — Telephone Encounter (Signed)
 Call to patient who reports that when he takes the gabapentin , his hands start to shake. He took this in the past and the same thing happened. He is asking to try another medication. Advised I would reach out to Dr. Samara Crest for advice.

## 2023-12-10 NOTE — Telephone Encounter (Signed)
 Patient said having a side effect to gabapentin  (NEURONTIN ) 100 MG capsule. Hand is shaking, dizziness, body switching. Would like a call from the nurse.

## 2023-12-10 NOTE — Telephone Encounter (Signed)
 Call to patient, in agreement to stop gabapentin  and try OTC lidocaine  cream

## 2024-01-07 NOTE — Discharge Summary (Signed)
 Physician Discharge Summary  Patient ID: [name] MRN: [id number] DOB/AGE: [id number] 71 y.o.  Admit date: 11/19/2023 Discharge date: 11/23/2023  Admission Diagnoses:  Discharge Diagnoses:  Principal Problem:   Sepsis (HCC) Active Problems:   Essential hypertension   Anemia   ESRD on dialysis (HCC)   Hyperkalemia   Generalized weakness   History of endocarditis   Seizure disorder [facility])   Discharged Condition: stable  Hospital Course: Patient is a 73 year old African-American male with past medical history significant for hypertension, hyperlipidemia, ESRD on HD, DM type II, seizure disorder and anemia of chronic disease.  Patient was recently hospitalized from 2/7-2/13/2025 with sepsis secondary to Staph epidermidis bacteremia, with TEE revealing a small (2 mm) hypermobile echodensity on the ventricular surface of the anterior leaflet.  Small vegetation could not be ruled out.  Cannot exclude small vegetation.  It was recommended on prior discharge (09/24/2023) for patient to continue Ancef  through 10/29/2023 with HD.  Patient was admitted with generalized weakness, associated with acute onset of inability to move (likely due to weakness) and coughing.  On presentation to the emergency department, patient was noted to be febrile up to 102 F with tachypnea.  Labs revealed hemoglobin 9.9, potassium 5.4 BUN 60, creatinine 1.64, INR 1.2, and lactic acid 1.4.  Chest x-ray noted possible developing retrocardiac airspace opacity, cardiomegaly with pericardial effusion not excluded, and bilateral trace pleural effusions.  Influenza, COVID-19, RSV screening were negative.  Patient was admitted for further assessment and management.  Patient was initially managed with vancomycin , metronidazole , and cefepime .  CT chest revealed consolidation left lower lobe.  CT head was nonrevealing.  Blood culture did not grow any organisms.  Patient gradually improved.  Altered mentation also resolved.   Hemodialysis was continued during the hospital stay.  Antibiotics were transitioned to oral antibiotics.  Patient will follow-up with primary care provider and nephrology team on discharge.  Fever SIRS/sepsis secondary to possible community-acquired pneumonia -Patient presented with fever up to 102 F with weakness.  He was noted to be tachypneic meeting SIRS criteria.   -Chest x-ray was grossly was concerning for possible developing retrocardiac airspace opacity.  -CT Chest is as documented above -Blood culture has not grown any organisms to date.   -Patient was initially started on empiric antibiotics of vancomycin , cefepime , and metronidazole .  Of note, patient was recently hospitalized and completed 6 weeks of antibiotics for staph epididymis bacteremia. -Antibiotics will be transitioned to oral on discharge   Hyperkalemia: -Resolved with dialysis.   ESRD on HD -Nephrology input is appreciated. -Further care as per nephrology team.   Generalized weakness  -CT head is nonrevealing. - PT/OT to evaluate and treat   Essential hypertension -Currently controlled.   History of endocarditis Patient had been been found to have Staph epidermidis bacteremia with concern for vegetation on TEE during hospitalization from 2/7-2/13/2025.  He was treated with IV Ancef  until 10/29/23.   Possible seizure disorder During hospitalization 1/27-2/2 there had been concern for possible seizure activity.  EEG showed no evidence of seizures and MRI brain showed no focus for seizures.  He was discharged on Keppra  500 mg daily to be continued until follow-up, but follow-up appointment had not been scheduled until the 20th of this month and no refills were given.  - Resume Keppra   - Encouraged patient to keep follow-up appointment with neurology on the 20th of this month.   Anemia of chronic disease Hemoglobin 9.9 which appears around patient's baseline. -Continue to monitor  Consults:  nephrology  Significant Diagnostic Studies:   Treatments:   Discharge Exam: Blood pressure 135/65, pulse 71, temperature 98.7 F (37.1 C), temperature source Oral, resp. rate 17, height 5\' 11"  (1.803 m), weight 66.9 kg, SpO2 97%.   Disposition: Discharge disposition: 01-Home or Self Care       Discharge Instructions     Diet - low sodium heart healthy   Complete by: As directed    Increase activity slowly   Complete by: As directed       Allergies as of 11/23/2023   No Known Allergies      Medication List     STOP taking these medications    clonazepam  2 MG disintegrating tablet Commonly known as: KLONOPIN    levETIRAcetam  500 MG tablet Commonly known as: Keppra    tiZANidine 2 MG tablet Commonly known as: ZANAFLEX       TAKE these medications    acetaminophen  325 MG tablet Commonly known as: TYLENOL  Take 2 tablets (650 mg total) by mouth every 6 (six) hours as needed for mild pain (pain score 1-3) (or Fever >/= 101). What changed:  medication strength how much to take when to take this reasons to take this   amLODipine  10 MG tablet Commonly known as: NORVASC  Take 10 mg by mouth daily.   calcitRIOL  0.5 MCG capsule Commonly known as: ROCALTROL  Take 0.5 mcg by mouth See admin instructions. Take one tablet by mouth three times a week on Monday, Wednesday and Fridays per wife   carvedilol  25 MG tablet Commonly known as: COREG  Take 25 mg by mouth 2 (two) times daily with a meal.   cinacalcet  30 MG tablet Commonly known as: SENSIPAR  Take 2 tablets (60 mg total) by mouth every Monday, Wednesday, and Friday at 6 PM.   hydrALAZINE  25 MG tablet Commonly known as: APRESOLINE  Take 1 tablet (25 mg total) by mouth 2 (two) times daily.   polyethylene glycol 17 g packet Commonly known as: MIRALAX  / GLYCOLAX  Take 17 g by mouth daily.   sevelamer  carbonate 800 MG tablet Commonly known as: RENVELA  Take 1,600 mg by mouth 3 (three) times daily with  meals.       ASK your doctor about these medications    amoxicillin -clavulanate 500-125 MG tablet Commonly known as: Augmentin  Take 1 tablet by mouth at bedtime for 5 days. Ask about: Should I take this medication?   doxycycline  100 MG tablet Commonly known as: VIBRA -TABS Take 1 tablet (100 mg total) by mouth 2 (two) times daily for 5 days. Ask about: Should I take this medication?       Time spent: 35 minutes.  SignedDoroteo Gasmen 01/07/2024, 10:43 AM

## 2024-03-10 ENCOUNTER — Other Ambulatory Visit: Payer: Self-pay | Admitting: Urology

## 2024-03-15 ENCOUNTER — Ambulatory Visit

## 2024-03-16 NOTE — Progress Notes (Signed)
 COVID Vaccine received:  []  No [x]  Yes Date of any COVID positive Test in last 90 days:  PCP - [name] Molt, MD  Cardiologist -   Neurology- Pastor Falling, MD   Chest x-ray - 09-18-23   2v  Epic EKG -11-19-2023  Epic   Stress Test -  ECHO - 09-23-2023  Epic Cardiac Cath -  CT Coronary Calcium  score:   Bowel Prep - [x]  No  []   Yes ______  Pacemaker / ICD device [x]  No []  Yes   Spinal Cord Stimulator:[x]  No []  Yes       History of Sleep Apnea? [x]  No []  Yes   CPAP used?- [x]  No []  Yes    Patient has: []  NO Hx DM   []  Pre-DM   []  DM1  []   DM2 Does the patient monitor blood sugar?   []  N/A   []  No []  Yes  Last A1c was: 5.2 normal  on  09-15-23     Blood Thinner / Instructions:  none Aspirin  Instructions:  ASA 81 mg   ERAS Protocol Ordered: [x]  No  []  Yes Patient is to be NPO after: MN Prior  Dental hx: []  Dentures:  []  N/A      []  Bridge or Partial:                   []  Loose or Damaged teeth:   Comments: Has HD at the [facility] on M,W,F   Activity level: Able to walk up 2 flights of stairs without becoming significantly short of breath or having chest pain?  []  No   []    Yes  Patient can perform ADLs without assistance. []  No   []   Yes  Anesthesia review: HTN, CHF-HFpEF, hx renal transplant which failed- patient now on HD via Rt brachiocephalic AVF, anemia, Hx seizure (12-01-23)    Patient denies any S&S of respiratory illness or Covid - no shortness of breath, fever, cough or chest pain at PAT appointment.  Patient verbalized understanding and agreement to the Pre-Surgical Instructions that were given to them at this PAT appointment. Patient was also educated of the need to review these PAT instructions again prior to his surgery.I reviewed the appropriate phone numbers to call if they have any and questions or concerns.

## 2024-03-16 NOTE — Patient Instructions (Signed)
 SURGICAL WAITING ROOM VISITATION Patients having surgery or a procedure may have no more than 2 support people in the waiting area - these visitors may rotate in the visitor waiting room.   If the patient needs to stay at the hospital during part of their recovery, the visitor guidelines for inpatient rooms apply.  PRE-OP VISITATION  Pre-op nurse will coordinate an appropriate time for 1 support person to accompany the patient in pre-op.  This support person may not rotate.  This visitor will be contacted when the time is appropriate for the visitor to come back in the pre-op area.  Please refer to the [facility] website for the visitor guidelines for Inpatients (after your surgery is over and you are in a regular room).  You are not required to quarantine at this time prior to your surgery. However, you must do this: Hand Hygiene often Do NOT share personal items Notify your provider if you are in close contact with someone who has COVID or you develop fever 100.4 or greater, new onset of sneezing, cough, sore throat, shortness of breath or body aches.  If you test positive for Covid or have been in contact with anyone that has tested positive in the last 10 days please notify you surgeon.    Your procedure is scheduled on:  TUESDAY  March 22, 2024  Report to [facility] Main Entrance: [address] entrance where the [employer] is available.   Report to admitting at:  10:15   AM  Call this number if you have any questions or problems the morning of surgery [phone]  DO NOT EAT OR DRINK ANYTHING AFTER MIDNIGHT THE NIGHT PRIOR TO YOUR SURGERY / PROCEDURE.   FOLLOW  ANY ADDITIONAL PRE OP INSTRUCTIONS YOU RECEIVED FROM YOUR SURGEON'S OFFICE!!!   Oral Hygiene is also important to reduce your risk of infection.        Remember - BRUSH YOUR TEETH THE MORNING OF SURGERY WITH YOUR REGULAR TOOTHPASTE  Do NOT smoke after Midnight the night before surgery.  STOP TAKING all  Vitamins, Herbs and supplements 1 week before your surgery.   Take ONLY these medicines the morning of surgery with A SIP OF WATER: Carvedilol , and you may take Tylenol  if needed for pain.                    You may not have any metal on your body including  jewelry, and body piercing  Do not wear lotions, powders, cologne, or deodorant  Men may shave face and neck.  Contacts, Hearing Aids, dentures or bridgework may not be worn into surgery. DENTURES WILL BE REMOVED PRIOR TO SURGERY PLEASE DO NOT APPLY Poly grip OR ADHESIVES!!!   Patients discharged on the day of surgery will not be allowed to drive home.  Someone NEEDS to stay with you for the first 24 hours after anesthesia.  Do not bring your home medications to the hospital. The Pharmacy will dispense medications listed on your medication list to you during your admission in the Hospital.   Please read over the following fact sheets you were given: IF YOU HAVE QUESTIONS ABOUT YOUR PRE-OP INSTRUCTIONS, PLEASE CALL [phone].   [place] - Preparing for Surgery Before surgery, you can play an important role.  Because skin is not sterile, your skin needs to be as free of germs as possible.  You can reduce the number of germs on your skin by washing with CHG (chlorahexidine gluconate) soap before  surgery.  CHG is an antiseptic cleaner which kills germs and bonds with the skin to continue killing germs even after washing. Please DO NOT use if you have an allergy to CHG or antibacterial soaps.  If your skin becomes reddened/irritated stop using the CHG and inform your nurse when you arrive at Short Stay. Do not shave (including legs and underarms) for at least 48 hours prior to the first CHG shower.  You may shave your face/neck.  Please follow these instructions carefully:  1.  Shower with CHG Soap the night before surgery and the  morning of surgery.  2.  If you choose to wash your hair, wash your hair first as usual with your  normal  shampoo.  3.  After you shampoo, rinse your hair and body thoroughly to remove the shampoo.                             4.  Use CHG as you would any other liquid soap.  You can apply chg directly to the skin and wash.  Gently with a scrungie or clean washcloth.  5.  Apply the CHG Soap to your body ONLY FROM THE NECK DOWN.   Do not use on face/ open                           Wound or open sores. Avoid contact with eyes, ears mouth and genitals (private parts).                       Wash face,  Genitals (private parts) with your normal soap.             6.  Wash thoroughly, paying special attention to the area where your  surgery  will be performed.  7.  Thoroughly rinse your body with warm water from the neck down.  8.  DO NOT shower/wash with your normal soap after using and rinsing off the CHG Soap.            9.  Pat yourself dry with a clean towel.            10.  Wear clean pajamas.            11.  Place clean sheets on your bed the night of your first shower and do not  sleep with pets.  ON THE DAY OF SURGERY : Do not apply any lotions/deodorants the morning of surgery.  Please wear clean clothes to the hospital/surgery center.    FAILURE TO FOLLOW THESE INSTRUCTIONS MAY RESULT IN THE CANCELLATION OF YOUR SURGERY  PATIENT SIGNATURE_________________________________  NURSE SIGNATURE__________________________________  ________________________________________________________________________

## 2024-03-17 ENCOUNTER — Other Ambulatory Visit: Payer: Self-pay

## 2024-03-17 ENCOUNTER — Encounter (HOSPITAL_COMMUNITY): Payer: Self-pay

## 2024-03-17 ENCOUNTER — Encounter (HOSPITAL_COMMUNITY)
Admission: RE | Admit: 2024-03-17 | Discharge: 2024-03-17 | Disposition: A | Discharge status: Home / Self Care | Source: Ambulatory Visit | Attending: Urology | Admitting: Urology

## 2024-03-17 VITALS — BP 97/62 | HR 78 | Temp 97.9°F | Resp 14 | Ht 70.0 in | Wt 147.0 lb

## 2024-03-17 DIAGNOSIS — N186 End stage renal disease: Secondary | ICD-10-CM | POA: Diagnosis not present

## 2024-03-17 DIAGNOSIS — Z992 Dependence on renal dialysis: Secondary | ICD-10-CM | POA: Insufficient documentation

## 2024-03-17 DIAGNOSIS — Z01818 Encounter for other preprocedural examination: Secondary | ICD-10-CM

## 2024-03-17 DIAGNOSIS — Z01812 Encounter for preprocedural laboratory examination: Secondary | ICD-10-CM | POA: Diagnosis present

## 2024-03-17 DIAGNOSIS — I5032 Chronic diastolic (congestive) heart failure: Secondary | ICD-10-CM

## 2024-03-17 DIAGNOSIS — R7989 Other specified abnormal findings of blood chemistry: Secondary | ICD-10-CM | POA: Insufficient documentation

## 2024-03-17 HISTORY — DX: Myoneural disorder, unspecified: G70.9

## 2024-03-17 HISTORY — DX: Heart failure, unspecified: I50.9

## 2024-03-17 HISTORY — DX: Pneumonia, unspecified organism: J18.9

## 2024-03-17 HISTORY — DX: Unspecified osteoarthritis, unspecified site: M19.90

## 2024-03-17 LAB — CBC
HCT: 45.6 % (ref 39.0–52.0)
Hemoglobin: 13.6 g/dL (ref 13.0–17.0)
MCH: 26.5 pg (ref 26.0–34.0)
MCHC: 29.8 g/dL — ABNORMAL LOW (ref 30.0–36.0)
MCV: 88.9 fL (ref 80.0–100.0)
Platelets: 112 K/uL — ABNORMAL LOW (ref 150–400)
RBC: 5.13 MIL/uL (ref 4.22–5.81)
RDW: 16.8 % — ABNORMAL HIGH (ref 11.5–15.5)
WBC: 4 K/uL (ref 4.0–10.5)
nRBC: 0 % (ref 0.0–0.2)

## 2024-03-18 ENCOUNTER — Encounter (HOSPITAL_COMMUNITY): Payer: Self-pay

## 2024-03-18 NOTE — Progress Notes (Addendum)
 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  Seen by neurology and prescribed Keppra  and Klonipin.  He followed up with neurology on 12/01/2023 after self DC'd Keppra .  Neurologist recommended not restarting antiseizure medicine as it was likely provoked from missing dialysis.  Patient admitted from 2/8 to 2/13 due to sepsis from staph epidermidis with mitral valve endocarditis. TEE positive for probable mitral valve vegetation.  Echo also notable for reduced EF to 40 to 45%, moderate pericardial effusion, mild MR, severe TR.  His volume status is managed by dialysis.  He was treated with IV antibiotics per ID. No Cardiology follow up specifically recommended by hospitalist.  Patient readmitted from 4/10 to 4/14 due to altered mental status with SIRS/sepsis due to CAP.  Treated again with antibiotics.  Patient with ESRD and followed by nephrology.  He undergoes dialysis Monday, Wednesday, Friday.  He has been referred for another transplant evaluation. Last HD note: 03/11/24: On HD. OLC green. BP 145/76. UFG 2L - meeting edw. Hgb 13.3. AVF in use. K 6.0 continue Kayexalate on non hd days. ENcouraged diet and binders re phos. PHD  Patient seen by Cardiology at [facility] for evaluation of renal transplant. Stress echo  done on 10/01/2021 which was normal with mild valvular abnormalities.  Patient last seen by PCP in Feb 2025. Recommended medical clearance based on complex medical hx and multiple hospitalizations. Discussed with Nena with [facility].   Addendum: As of 8/11 patient has not had medical clearance. Discussed with anesthesioloigst Dr. Boone who agrees with PCP clearance prior to elective surgery. [facility] office notified.  VS: BP 97/62 Comment: left arm sitting  Pulse 78   Temp 36.6 C (Oral)   Resp 14   Ht 5' 10 (1.778 m)   Wt 66.7 kg   SpO2 96%   BMI 21.09 kg/m   PROVIDERS: Joshua [name] CROME, MD Neurology- Pastor Falling, MD  Nephrology- Gordy Blanch, MD   LABS: Labs reviewed: Acceptable for surgery. Collect CMP DOS (all labs ordered are listed, but only abnormal results are displayed)  Labs Reviewed  CBC - Abnormal; Notable for the following components:      Result Value   MCHC 29.8 (*)    RDW 16.8 (*)    Platelets 112 (*)    All other components within normal limits     IMAGES:  CT chest 11/20/23:  IMPRESSION: 1. Small pericardial effusion. 2. Small bilateral pleural effusions. 3. Mild consolidation in the LEFT lower lobe suggest mild pneumonia or aspiration. 4. Multiple mediastinal lymph nodes are favored reactive. 5. 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    EKG 11/19/2023:  Sinus rhythm RSR' in V1 or V2, probably normal variant Consider left ventricular hypertrophy Borderline T abnormalities, lateral leads  CV:  Echo 09/23/2023:  IMPRESSIONS    1. Left ventricular ejection fraction, by estimation, is  40 to 45%. Left ventricular ejection fraction by 3D volume is 44 %. The left ventricle has mildly decreased function.  2. Normal RV Free wall strain. Right ventricular systolic function is normal. The right ventricular size is mildly enlarged.  3. Left atrial size was severely dilated. No left atrial/left atrial appendage  thrombus was detected. The LAA emptying velocity was 55 cm/s.  4. Right atrial size was severely dilated.  5. Moderate pericardial effusion. The pericardial effusion is posterior to the left ventricle.  6. Many degnerative changes on the mitral valve. There is a small (2 mm) hypermobile echodensity on the ventricular surface of the anterior leaflet. Cannot exclude small vegetation . The mitral valve is degenerative. Mild mitral valve regurgitation. Moderate mitral annular calcification.  7. Functional regurgitation. Anterior-septal coaptation gap 3 mm, AP coaptation gap 4 mm. Tricuspid valve regurgitation is severe.  8. The aortic valve is tricuspid. Aortic valve regurgitation is mild. No aortic stenosis is present.  9. The inferior vena cava is dilated in size with <50% respiratory variability, suggesting right atrial pressure of 15 mmHg. 10. 3D performed of the tricuspid valve and demonstrates Severe tricuspid regurgitation that is anterior-septal in nature. This is atrial functional in nature. 3D VCA 0.599 cm2.  Stress echo 10/01/2021:  INTERPRETATION ---------------------------------------------------------------    NORMAL STRESS TEST. NORMAL RESTING STUDY WITH NO WALL MOTION ABNORMALITIES   AT REST AND PEAK STRESS.   NORMAL LA PRESSURES WITH DIASTOLIC DYSFUNCTION   VALVULAR REGURGITATION: MILD AR, TRIVIAL MR, TRIVIAL PR, TRIVIAL TR   NO VALVULAR STENOSIS   Maximum workload of  4.30 METs was achieved during exercise.   RESTING HYPERTENSION - BLUNTED RESPONSE   Past Medical History:  Diagnosis Date   Anemia    d/t ESRD   Arthritis    Benign colon polyp 08/11/2010   CHF (congestive heart failure) (HCC)    Clot    STATED TOLD IN APRIL CLOT TO LEFT SHOULDER. 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   HTN (hypertension)    Hyperlipidemia    Neuromuscular disorder (HCC)    neuropathy down LLE   Pneumonia    Seizures (HCC)     Past Surgical History:  Procedure Laterality Date   AV FISTULA PLACEMENT  01/19/2012   Procedure: ARTERIOVENOUS (AV) FISTULA CREATION;  Surgeon: Redell LITTIE Door, MD;  Location: [facility] OR;  Service: Vascular;  Laterality: Right;  Right Brachio-cephalic arteriovenous fistula   AV FISTULA PLACEMENT Right 07/18/2021   Procedure: INSERTION OF RIGHT UPPER EXTREMITY ARTERIOVENOUS (AV) GORE-TEX GRAFT;  Surgeon: Magda [name] SAILOR, MD;  Location: [facility] OR;  Service: Vascular;  Laterality: Right;  PERIPHERAL NERVE BLOCK   AV FISTULA PLACEMENT, RADIOCEPHALIC  12/31/10   Right arm   INSERTION OF DIALYSIS CATHETER  01/19/2012   Procedure: INSERTION OF DIALYSIS CATHETER;  Surgeon: Redell LITTIE Door, MD;  Location: [facility] OR;  Service: Vascular;  Laterality: N/A;  right internal jugular vein   TRANSESOPHAGEAL ECHOCARDIOGRAM (CATH LAB) N/A 09/23/2023   Procedure: TRANSESOPHAGEAL ECHOCARDIOGRAM;  Surgeon: Santo Stanly LABOR, MD;  Location: [facility] INVASIVE CV LAB;  Service: Cardiovascular;  Laterality: N/A;   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   UPPER EXTREMITY VENOGRAPHY Right 06/11/2021   Procedure: UPPER EXTREMITY VENOGRAPHY;  Surgeon: Serene Gaile ORN, MD;  Location: [facility] INVASIVE CV LAB;  Service: Cardiovascular;  Laterality: Right;   VENOGRAM Left 05/30/2021   Procedure: DIAGNOSTIC CENTRAL VENOGRAM;  Surgeon: Lanis Fonda BRAVO, MD;  Location: [facility] OR;  Service: Vascular;  Laterality: Left;    MEDICATIONS:  amLODipine  (NORVASC ) 10 MG tablet   carvedilol  (COREG ) 25 MG tablet   cinacalcet  (SENSIPAR ) 30 MG tablet   hydrALAZINE  (APRESOLINE ) 25 MG tablet   polyethylene glycol (MIRALAX  / GLYCOLAX ) 17 g packet   sevelamer  carbonate (RENVELA ) 800 MG tablet   sodium polystyrene (KAYEXALATE) powder   No current facility-administered medications for this encounter.   Burnard CHRISTELLA Odis DEVONNA [facility]  Surgical Short Stay/Anesthesiology [facility] Phone [phone] 03/18/2024 10:10 AM

## 2024-03-21 ENCOUNTER — Telehealth: Payer: Self-pay | Admitting: Internal Medicine

## 2024-03-21 ENCOUNTER — Encounter (HOSPITAL_COMMUNITY): Payer: Self-pay | Admitting: Medical

## 2024-03-21 NOTE — Telephone Encounter (Signed)
 [facility] faxed document Surgical Clearance, to be filled out by provider. Patient requested to send it back via Fax within 7-days. Document is located in providers tray at front office.Please advise at Mobile [phone] (mobile)

## 2024-03-21 NOTE — Anesthesia Preprocedure Evaluation (Signed)
 Anesthesia Evaluation    Airway        Dental   Pulmonary           Cardiovascular hypertension,      Neuro/Psych    GI/Hepatic   Endo/Other  diabetes    Renal/GU      Musculoskeletal   Abdominal   Peds  Hematology   Anesthesia Other Findings   Reproductive/Obstetrics                              Anesthesia Physical Anesthesia Plan  ASA:   Anesthesia Plan:    Post-op Pain Management:    Induction:   PONV Risk Score and Plan:   Airway Management Planned:   Additional Equipment:   Intra-op Plan:   Post-operative Plan:   Informed Consent:   Plan Discussed with:   Anesthesia Plan Comments: (See PAT note from 8/7. PCP clearance recommended, not done as of 8/11. Reviewed with Dr. Boone who is in agreement to postpone until patient is seen by PCP after multiple admissions.)         Anesthesia Quick Evaluation

## 2024-03-21 NOTE — H&P (Signed)
 CC/HPI: cc: urethral stricture   01/01/2024: 73 year old man who was last seen at [facility] urology in 2019 for urethral stricture. He previously has undergone urethral dilation as well as DVIU in 2018. At the time he was last seen he declined definitive treatment of his stricture. He has a history of a renal transplant. He is currently on dialysis again and has been since 2021. He is being evaluated at [facility] for. He was last seen at [facility] urology and underwent urethral dilation in September 2021. He does not make any urine.   IPSS: 0, 0, 0, 0, 0, 0, 5,0= 5/35  3/6 mixed quality-of-life   03/04/24: Here for cysto to eval for urethral stricture. He does not make urine but hoping to have renal transplant.     ALLERGIES: Hydrocodone    MEDICATIONS: amLODIPine  Besylate 5 MG Tablet  Atorvastatin Calcium  10 MG Tablet  Coreg  12.5 MG Tablet  hydrALAZINE  HCl 25 MG Tablet  Labetalol  HCl 200 MG Tablet  Losartan  Potassium 50 MG Tablet  Magnesium Oxide 400     GU PSH: Complex Uroflow - 2019 Cysto Dilate Stricture (M or F) - 2015 Cystoscopy VIU - 2017       PSH Notes: Multiple vascular access procedures.    NON-GU PSH: Transplant Kidney - 2015     GU PMH: Bulbar urethral stricture - 01/01/2024, He has multilevel urethral stricture disease with a reduced stream and some urgency with UUI. I reviewed the treatment options with him again including urethroplasty w/wo spt, perineal urethrostomy or dilation with self cath to maintain patency. He would like to monitor the condition for now and I will have him return in 4 months for a flowrate and PVR. 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 - 2019 Kidney Failure Unspec    NON-GU PMH: Bacteriuria, He has pyuria with some bacteria in the urine today. I will get a culture but think it is more inflammatory than  infectious. - 2019 Diabetes Type 2 GERD Gout Hypercholesterolemia Hypertension    FAMILY HISTORY: 1 son - Son 4 daughters - Daughter Arthritis - Father Diabetes - Runs in Family respiratory failure - Mother   SOCIAL HISTORY: Marital Status: Married Preferred Language: English; Race: Black or African American Current Smoking Status: Patient has never smoked.   Tobacco Use Assessment Completed: Used Tobacco in last 30 days? Drinks 1 drink per month.      Notes: 1 son, 4 daughters Retired Freight forwarder.    REVIEW OF SYSTEMS:    GU Review Male:   Patient denies frequent urination, hard to postpone urination, burning/ pain with urination, get up at night to urinate, leakage of urine, stream starts and stops, trouble starting your stream, have to strain to urinate , erection problems, and penile pain.  Gastrointestinal (Upper):   Patient denies nausea, vomiting, and indigestion/ heartburn.  Gastrointestinal (Lower):   Patient denies diarrhea and constipation.  Constitutional:   Patient denies fever, night sweats, weight loss, and fatigue.  Skin:   Patient denies skin rash/ lesion and itching.  Eyes:   Patient denies blurred vision and double vision.  Ears/ Nose/ Throat:   Patient denies sore throat and sinus problems.  Hematologic/Lymphatic:   Patient denies swollen glands and easy bruising.  Cardiovascular:   Patient denies chest pains and leg swelling.  Respiratory:   Patient denies cough and shortness of  breath.  Endocrine:   Patient denies excessive thirst.  Musculoskeletal:   Patient denies back pain and joint pain.  Neurological:   Patient denies headaches and dizziness.  Psychologic:   Patient denies depression and anxiety.   VITAL SIGNS: None   GU PHYSICAL EXAMINATION:    Urethral Meatus: Normal size. No lesion, no wart, no discharge, no polyp. Normal location.   MULTI-SYSTEM PHYSICAL EXAMINATION:    Constitutional: Well-nourished. No physical  deformities. Normally developed. Good grooming.  Neck: Neck symmetrical, not swollen. Normal tracheal position.  Respiratory: No labored breathing, no use of accessory muscles.   Skin: No paleness, no jaundice, no cyanosis. No lesion, no ulcer, no rash.  Neurologic / Psychiatric: Oriented to time, oriented to place, oriented to person. No depression, no anxiety, no agitation.  Eyes: Normal conjunctivae. Normal eyelids.  Ears, Nose, Mouth, and Throat: Left ear no scars, no lesions, no masses. Right ear no scars, no lesions, no masses. Nose no scars, no lesions, no masses. Normal hearing. Normal lips.  Musculoskeletal: Normal gait and station of head and neck.     Complexity of Data:  Records Review:   Previous Patient Records, POC Tool   PROCEDURES:         Flexible Cystoscopy - 52000  Risks, benefits, and some of the potential complications of the procedure were discussed at length with the patient including infection, bleeding, voiding discomfort, urinary retention, fever, chills, sepsis, and others. All questions were answered. Informed consent was obtained. Sterile technique and intraurethral analgesia were used.  Meatus:  Normal size. Normal location. Normal condition.  Urethra:  Moderate bulbous stricture.  External Sphincter:  Normal.      The lower urinary tract was carefully examined. The procedure was well-tolerated and without complications. Antibiotic instructions were given. Instructions were given to call the office immediately for bloody urine, difficulty urinating, urinary retention, painful or frequent urination, fever, chills, nausea, vomiting or other illness. The patient stated that he understood these instructions and would comply with them.   ASSESSMENT:      ICD-10 Details  1 GU:   Bulbar urethral stricture - N35.011 Undiagnosed New Problem   PLAN:           Document Letter(s):  Created for Patient: Clinical Summary         Notes:   1. Bulbar urethral stricture:   -discussed moving forward with optilume urethral dilation  -risks and benefits discussed   Schedule next available surgery date around dialysis days (MWF)

## 2024-03-22 ENCOUNTER — Ambulatory Visit (HOSPITAL_COMMUNITY): Admission: RE | Admit: 2024-03-22 | Source: Home / Self Care | Admitting: Urology

## 2024-03-22 ENCOUNTER — Encounter (HOSPITAL_COMMUNITY): Admission: RE | Payer: Self-pay | Source: Home / Self Care

## 2024-03-22 ENCOUNTER — Encounter (HOSPITAL_COMMUNITY): Payer: Self-pay | Admitting: Anesthesiology

## 2024-03-22 ENCOUNTER — Telehealth: Payer: Self-pay

## 2024-03-22 ENCOUNTER — Encounter (HOSPITAL_COMMUNITY): Payer: Self-pay | Admitting: Medical

## 2024-03-22 ENCOUNTER — Ambulatory Visit (INDEPENDENT_AMBULATORY_CARE_PROVIDER_SITE_OTHER): Admitting: Internal Medicine

## 2024-03-22 ENCOUNTER — Encounter: Payer: Self-pay | Admitting: Internal Medicine

## 2024-03-22 VITALS — BP 108/66 | HR 76 | Temp 98.6°F | Resp 16 | Ht 70.0 in | Wt 151.0 lb

## 2024-03-22 DIAGNOSIS — I10 Essential (primary) hypertension: Secondary | ICD-10-CM | POA: Diagnosis not present

## 2024-03-22 DIAGNOSIS — R9431 Abnormal electrocardiogram [ECG] [EKG]: Secondary | ICD-10-CM

## 2024-03-22 DIAGNOSIS — I950 Idiopathic hypotension: Secondary | ICD-10-CM

## 2024-03-22 LAB — BASIC METABOLIC PANEL WITH GFR
BUN: 47 mg/dL — ABNORMAL HIGH (ref 6–23)
CO2: 32 meq/L (ref 19–32)
Calcium: 10 mg/dL (ref 8.4–10.5)
Chloride: 87 meq/L — ABNORMAL LOW (ref 96–112)
Creatinine, Ser: 8.82 mg/dL (ref 0.40–1.50)
GFR: 5.57 mL/min — CL (ref >60.00)
Glucose, Bld: 115 mg/dL — ABNORMAL HIGH (ref 70–99)
Potassium: 4.8 meq/L (ref 3.5–5.1)
Sodium: 134 meq/L — ABNORMAL LOW (ref 135–145)

## 2024-03-22 LAB — TROPONIN I (HIGH SENSITIVITY): High Sens Troponin I: 23 ng/L (ref 2–17)

## 2024-03-22 LAB — BRAIN NATRIURETIC PEPTIDE: Pro B Natriuretic peptide (BNP): 391 pg/mL — ABNORMAL HIGH (ref 0.0–100.0)

## 2024-03-22 LAB — CORTISOL: Cortisol, Plasma: 10.8 ug/dL

## 2024-03-22 LAB — TSH: TSH: 1.57 u[IU]/mL (ref 0.35–5.50)

## 2024-03-22 SURGERY — CYSTOURETHROSCOPY, WITH URETHRAL STRICTURE DILATION USING DRUG-COATED BALLOON
Anesthesia: Monitor Anesthesia Care

## 2024-03-22 NOTE — Progress Notes (Signed)
 Subjective:  Patient ID: [name], male    DOB: [birth year]  Age: 73 y.o. MRN: [id number]  CC: Surgical Clearance (Patient states that he was supposed to get his surgery done 2 hours ago. )   HPI [name] presents for f/up ---  Discussed the use of AI scribe software for clinical note transcription with the patient, who gave verbal consent to proceed.  History of Present Illness [name] is a 73 year old male with a history of kidney transplant and dialysis who presents for pre-surgical evaluation for urethral stricture.  He has a history of kidney transplant and has been on dialysis for approximately two to three years. His urethra became blocked, possibly due to disuse during dialysis.  No chest pain, shortness of breath, dizziness, lightheadedness, bleeding, bruising, or abdominal pain. He also reports no current issues with appetite, which has improved since earlier this year when he was hospitalized for five weeks due to pneumonia. During that time, he experienced poor appetite and was unresponsive to communication, leading to an initial diagnosis of amnesia, which was later ruled out. He was treated for pneumonia with antibiotics, and he reports resolution of symptoms with no current cough, fever, chills, or night sweats.  His blood pressure has been lower than usual, with a recent reading of 108/66, and it has been fluctuating, starting low and then increasing during dialysis sessions. His last EKG was performed in March during a hospital stay.  He confirms that he is not passing blood.    Outpatient Medications Prior to Visit  Medication Sig Dispense Refill   amLODipine  (NORVASC ) 10 MG tablet Take 10 mg by mouth at bedtime.     carvedilol  (COREG ) 25 MG tablet Take 25 mg by mouth 2 (two) times daily with a meal.     cinacalcet  (SENSIPAR ) 30 MG tablet Take 2 tablets (60 mg total) by mouth every Monday, Wednesday, and Friday at 6 PM. 60 tablet 0    polyethylene glycol (MIRALAX  / GLYCOLAX ) 17 g packet Take 17 g by mouth daily. 14 each 0   sevelamer  carbonate (RENVELA ) 800 MG tablet Take 4 tablets by mouth 3 (three) times daily with meals.     sodium polystyrene (KAYEXALATE) powder Take 30 g by mouth 2 (two) times a week. Thursdays Sundays     hydrALAZINE  (APRESOLINE ) 25 MG tablet Take 1 tablet (25 mg total) by mouth 2 (two) times daily. (Patient not taking: Reported on 03/17/2024) 30 tablet 0   No facility-administered medications prior to visit.    ROS Review of Systems  Constitutional: Negative.  Negative for appetite change, chills, diaphoresis and fatigue.  HENT: Negative.  Negative for trouble swallowing.   Eyes: Negative.  Negative for visual disturbance.  Respiratory:  Negative for shortness of breath and wheezing.   Cardiovascular:  Negative for chest pain, palpitations and leg swelling.  Gastrointestinal:  Negative for abdominal pain, constipation, diarrhea, nausea and vomiting.  Endocrine: Negative.   Genitourinary:  Positive for difficulty urinating. Negative for dysuria.  Musculoskeletal: Negative.  Negative for arthralgias.  Skin: Negative.   Neurological:  Negative for dizziness, weakness and light-headedness.  Hematological:  Negative for adenopathy. Does not bruise/bleed easily.  Psychiatric/Behavioral: Negative.      Objective:  BP 108/66 (BP Location: Left Arm, Patient Position: Sitting, Cuff Size: Normal)   Pulse 76   Temp 98.6 F (37 C) (Oral)   Resp 16   Ht 5' 10 (1.778 m)   Wt 151 lb (68.5  kg)   SpO2 99%   BMI 21.67 kg/m   BP Readings from Last 3 Encounters:  03/22/24 108/66  03/17/24 97/62  12/01/23 (!) 144/61    Wt Readings from Last 3 Encounters:  03/22/24 151 lb (68.5 kg)  03/17/24 147 lb (66.7 kg)  12/01/23 155 lb 8 oz (70.5 kg)    Physical Exam Vitals reviewed.  Constitutional:      General: He is not in acute distress.    Appearance: He is ill-appearing. He is not toxic-appearing  or diaphoretic.  HENT:     Mouth/Throat:     Mouth: Mucous membranes are moist.  Eyes:     General: No scleral icterus.    Conjunctiva/sclera: Conjunctivae normal.  Cardiovascular:     Rate and Rhythm: Normal rate and regular rhythm.     Heart sounds: Normal heart sounds, S1 normal and S2 normal. No murmur heard.    Comments: EKG--- NSR, 66 bpm NS ST/T wave changes Prolonged QT No LVH New changes  Pulmonary:     Effort: Pulmonary effort is normal.     Breath sounds: No stridor. No wheezing, rhonchi or rales.  Abdominal:     General: Abdomen is flat.     Palpations: There is no mass.     Tenderness: There is no abdominal tenderness. There is no guarding.     Hernia: No hernia is present.  Musculoskeletal:        General: No swelling.     Cervical back: Neck supple.     Right lower leg: No edema.     Left lower leg: No edema.  Lymphadenopathy:     Cervical: No cervical adenopathy.  Skin:    General: Skin is warm and dry.  Neurological:     General: No focal deficit present.     Mental Status: He is alert. Mental status is at baseline.  Psychiatric:        Mood and Affect: Mood normal.        Behavior: Behavior normal.     Lab Results  Component Value Date   WBC 4.0 03/17/2024   HGB 13.6 03/17/2024   HCT 45.6 03/17/2024   PLT 112 (L) 03/17/2024   GLUCOSE 115 (H) 03/22/2024   CHOL 148 03/07/2020   TRIG 81 03/07/2020   HDL 51 03/07/2020   LDLDIRECT 75.0 05/18/2017   LDLCALC 92 03/07/2020   ALT 16 11/20/2023   AST 34 11/20/2023   NA 134 (L) 03/22/2024   K 4.8 03/22/2024   CL 87 (L) 03/22/2024   CREATININE 8.82 (HH) 03/22/2024   BUN 47 (H) 03/22/2024   CO2 32 03/22/2024   TSH 1.57 03/22/2024   PSA 1.07 03/12/2021   INR 1.2 11/20/2023   HGBA1C 5.2 09/15/2023    No results found.  Assessment & Plan:  Essential hypertension -     EKG 12-Lead  Abnormal electrocardiogram (ECG) (EKG- Troponin and BNP are mildly elevated but he is asx. Likely caused by  ESRD. -     Brain natriuretic peptide; Future -     Troponin I (High Sensitivity); Future  Idiopathic hypotension- Labs are negative for secondary causes. -     TSH; Future -     Cortisol; Future -     Basic metabolic panel with GFR; Future     Follow-up: Return if symptoms worsen or fail to improve.  [name] Molt, MD

## 2024-03-22 NOTE — Telephone Encounter (Signed)
 CRITICAL VALUE STICKER  CRITICAL VALUE: Creatinine 8.82 and GFR 5.57  RECEIVER (on-site recipient of call): Reyes Fifield  DATE & TIME NOTIFIED: 03/22/2024 @ 4:21 PM  MESSENGER (representative from lab): Ernst   MD NOTIFIED: YES  TIME OF NOTIFICATION: 04:22PM

## 2024-03-22 NOTE — Telephone Encounter (Signed)
 Is [name] willing to send over surgical clearance based off his visit in February?   Copied from CRM [id number]. Topic: General - Other >> Mar 21, 2024  5:07 PM Paige D wrote: Reason for CRM: Pt is stating He is scheduled for surgery tomorrow and his pcp will not send over a clearance dude to him not being seen in 2 years? Pt states that is not true pt was seen 02/04 by stephanie mathews. pt would like some one to reach out to him first thing in the morning as his surgery is scheduled for 10:30 AM.

## 2024-03-22 NOTE — Telephone Encounter (Signed)
 CRITICAL VALUE STICKER  CRITICAL VALUE: 23 Troponin  RECEIVER (on-site recipient of call): Hadassah   DATE & TIME NOTIFIED: 08/12 315pm  MESSENGER (representative from lab): Saa  MD NOTIFIED: [name] Molt, MD  TIME OF NOTIFICATION: 316pm  RESPONSE:  Gave verbal understanding

## 2024-03-22 NOTE — Patient Instructions (Signed)

## 2024-03-25 ENCOUNTER — Telehealth: Payer: Self-pay

## 2024-03-25 NOTE — Telephone Encounter (Signed)
 Copied from CRM #[id number]. Topic: Referral - Status >> Mar 25, 2024  1:32 PM Mia F wrote: Reason for CRM: Pt says he was supposed to have a referral for neurology. He would like a call with a status on the referral

## 2024-03-28 ENCOUNTER — Other Ambulatory Visit: Payer: Self-pay | Admitting: Urology

## 2024-03-30 ENCOUNTER — Other Ambulatory Visit: Payer: Self-pay | Admitting: Internal Medicine

## 2024-03-30 ENCOUNTER — Ambulatory Visit (INDEPENDENT_AMBULATORY_CARE_PROVIDER_SITE_OTHER)

## 2024-03-30 VITALS — Ht 70.0 in | Wt 151.0 lb

## 2024-03-30 DIAGNOSIS — E118 Type 2 diabetes mellitus with unspecified complications: Secondary | ICD-10-CM | POA: Diagnosis not present

## 2024-03-30 DIAGNOSIS — Z0000 Encounter for general adult medical examination without abnormal findings: Secondary | ICD-10-CM | POA: Diagnosis not present

## 2024-03-30 NOTE — Telephone Encounter (Signed)
**Note De-identified  [place] Obfuscation** Please advise 

## 2024-03-30 NOTE — Patient Instructions (Addendum)
 Mr. [name] , Thank you for taking time out of your busy schedule to complete your Annual Wellness Visit with me. I enjoyed our conversation and look forward to speaking with you again next year. I, as well as your care team,  appreciate your ongoing commitment to your health goals. Please review the following plan we discussed and let me know if I can assist you in the future. Your Game plan/ To Do List    Referrals: If you haven't heard from the office you've been referred to, please reach out to them at the phone provided.   Follow up Visits: We will see or speak with you next year for your Next Medicare AWV with our clinical staff Have you seen your provider in the last 6 months (3 months if uncontrolled diabetes)? Yes  Clinician Recommendations:  Aim for 30 minutes of exercise or brisk walking, 6-8 glasses of water, and 5 servings of fruits and vegetables each day. Educated and advised on getting the Tdap and Shingles vaccines in 2025.      This is a list of the screenings recommended for you:  Health Maintenance  Topic Date Due   Zoster (Shingles) Vaccine (1 of 2) Never done   COVID-19 Vaccine (2 - Janssen risk series) 02/17/2020   DTaP/Tdap/Td vaccine (2 - Td or Tdap) 10/29/2020   Complete foot exam   12/28/2022   Eye exam for diabetics  07/10/2023   Flu Shot  03/11/2024   Hemoglobin A1C  03/14/2024   Medicare Annual Wellness Visit  03/30/2025   Colon Cancer Screening  05/15/2030   Pneumococcal Vaccine for age over 37  Completed   Hepatitis C Screening  Completed   HPV Vaccine  Aged Out   Meningitis B Vaccine  Aged Out   Pneumococcal Vaccine  Discontinued   Hepatitis B Vaccine  Discontinued    Advanced directives: (Declined) Advance directive discussed with you today. Even though you declined this today, please call our office should you change your mind, and we can give you the proper paperwork for you to fill out. Advance Care Planning is important because it:  [x]  Makes  sure you receive the medical care that is consistent with your values, goals, and preferences  [x]  It provides guidance to your family and loved ones and reduces their decisional burden about whether or not they are making the right decisions based on your wishes.  Follow the link provided in your after visit summary or read over the paperwork we have mailed to you to help you started getting your Advance Directives in place. If you need assistance in completing these, please reach out to us  so that we can help you!

## 2024-03-30 NOTE — Progress Notes (Signed)
 Subjective:   [name] is a 73 y.o. who presents for a Medicare Wellness preventive visit.  As a reminder, Annual Wellness Visits don't include a physical exam, and some assessments may be limited, especially if this visit is performed virtually. We may recommend an in-person follow-up visit with your provider if needed.  Visit Complete: Virtual I connected with  [name] on 03/30/24 by a audio enabled telemedicine application and verified that I am speaking with the correct person using two identifiers.  Patient Location: Home  Provider Location: Office/Clinic  I discussed the limitations of evaluation and management by telemedicine. The patient expressed understanding and agreed to proceed.  Vital Signs: Because this visit was a virtual/telehealth visit, some criteria may be missing or patient reported. Any vitals not documented were not able to be obtained and vitals that have been documented are patient reported.  VideoDeclined- This patient declined Librarian, academic. Therefore the visit was completed with audio only.  Persons Participating in Visit: Patient.  AWV Questionnaire: No: Patient Medicare AWV questionnaire was not completed prior to this visit.  Cardiac Risk Factors include: advanced age (>23men, >29 women);diabetes mellitus;dyslipidemia;hypertension;male gender     Objective:    Today's Vitals   03/30/24 0802 03/30/24 0803  Weight: 151 lb (68.5 kg)   Height: 5' 10 (1.778 m)   PainSc:  6   PainLoc: Back    Body mass index is 21.67 kg/m.     03/30/2024    8:02 AM 03/17/2024   11:54 AM 11/20/2023   11:00 AM 09/08/2023   12:19 PM 10/05/2022    4:48 PM 07/18/2021    6:21 AM 07/17/2021   11:48 AM  Advanced Directives  Does Patient Have a Medical Advance Directive? No No No No No Yes Yes  Type of Advance Directive      Living will Living will  Does patient want to make changes to medical advance directive?       No  - Patient declined  Copy of Healthcare Power of Attorney in Chart?      No - copy requested No - copy requested  Would patient like information on creating a medical advance directive? No - Patient declined No - Patient declined No - Patient declined No - Patient declined No - Patient declined      Current Medications (verified) Outpatient Encounter Medications as of 03/30/2024  Medication Sig   amLODipine  (NORVASC ) 10 MG tablet Take 10 mg by mouth at bedtime.   carvedilol  (COREG ) 25 MG tablet Take 25 mg by mouth 2 (two) times daily with a meal.   cinacalcet  (SENSIPAR ) 30 MG tablet Take 2 tablets (60 mg total) by mouth every Monday, Wednesday, and Friday at 6 PM.   polyethylene glycol (MIRALAX  / GLYCOLAX ) 17 g packet Take 17 g by mouth daily.   sevelamer  carbonate (RENVELA ) 800 MG tablet Take 4 tablets by mouth 3 (three) times daily with meals.   sodium polystyrene (KAYEXALATE) powder Take 30 g by mouth 2 (two) times a week. Thursdays Sundays   No facility-administered encounter medications on file as of 03/30/2024.    Allergies (verified) Patient has no known allergies.   History: Past Medical History:  Diagnosis Date   Anemia    d/t ESRD   Arthritis    Benign colon polyp 08/11/2010   CHF (congestive heart failure) (HCC)    Clot    STATED TOLD IN APRIL CLOT TO LEFT SHOULDER. 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   HTN (hypertension)    Hyperlipidemia    Neuromuscular disorder (HCC)    neuropathy down LLE   Pneumonia    Seizures (HCC)    Past Surgical History:  Procedure Laterality Date   AV FISTULA PLACEMENT  01/19/2012   Procedure: ARTERIOVENOUS (AV) FISTULA CREATION;  Surgeon: Redell LITTIE Door, MD;  Location: [facility] OR;  Service: Vascular;  Laterality: Right;  Right Brachio-cephalic  arteriovenous fistula   AV FISTULA PLACEMENT Right 07/18/2021   Procedure: INSERTION OF RIGHT UPPER EXTREMITY ARTERIOVENOUS (AV) GORE-TEX GRAFT;  Surgeon: Magda [name] SAILOR, MD;  Location: [facility] OR;  Service: Vascular;  Laterality: Right;  PERIPHERAL NERVE BLOCK   AV FISTULA PLACEMENT, RADIOCEPHALIC  12/31/10   Right arm   INSERTION OF DIALYSIS CATHETER  01/19/2012   Procedure: INSERTION OF DIALYSIS CATHETER;  Surgeon: Redell LITTIE Door, MD;  Location: [facility] OR;  Service: Vascular;  Laterality: N/A;  right internal jugular vein   TRANSESOPHAGEAL ECHOCARDIOGRAM (CATH LAB) N/A 09/23/2023   Procedure: TRANSESOPHAGEAL ECHOCARDIOGRAM;  Surgeon: Santo Stanly LABOR, MD;  Location: [facility] INVASIVE CV LAB;  Service: Cardiovascular;  Laterality: N/A;   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   UPPER EXTREMITY VENOGRAPHY Right 06/11/2021   Procedure: UPPER EXTREMITY VENOGRAPHY;  Surgeon: Serene Gaile ORN, MD;  Location: [facility] INVASIVE CV LAB;  Service: Cardiovascular;  Laterality: Right;   VENOGRAM Left 05/30/2021   Procedure: DIAGNOSTIC CENTRAL VENOGRAM;  Surgeon: Lanis Fonda BRAVO, MD;  Location: [facility] OR;  Service: Vascular;  Laterality: Left;   Family History  Problem Relation Age of Onset   Diabetes Father    Colon cancer Neg Hx    Esophageal cancer Neg Hx    Stomach cancer Neg Hx    Rectal cancer Neg Hx    Social History   Socioeconomic History   Marital status: Married    Spouse name: cordellia   Number of children: 5   Years of education: Not on file   Highest education level: Doctorate  Occupational History   Occupation: PhD Garment/textile technologist: [employer]  Tobacco Use   Smoking status: Never    Passive exposure: Never   Smokeless tobacco: Never  Vaping Use   Vaping status: Never Used  Substance and Sexual Activity   Alcohol use: Yes    Alcohol/week: 1.0 standard drink of alcohol    Types: 1 Shots of liquor per week    Comment: occasionally   Drug use: No   Sexual activity: Yes    Birth  control/protection: Condom  Other Topics Concern   Not on file  Social History Narrative   Regular Exercise -  YES         Social Drivers of Health   Financial Resource Strain: Low Risk  (03/30/2024)   Overall Financial Resource Strain (CARDIA)    Difficulty of Paying Living Expenses: Not hard at all  Food Insecurity: No Food Insecurity (03/30/2024)   Hunger Vital Sign    Worried About Running Out of Food in the Last Year: Never true    Ran Out of Food in the Last Year: Never true  Transportation Needs: No Transportation Needs (03/30/2024)   PRAPARE - Transportation    Lack of Transportation (Medical): No    Lack of  Transportation (Non-Medical): No  Physical Activity: Insufficiently Active (03/30/2024)   Exercise Vital Sign    Days of Exercise per Week: 3 days    Minutes of Exercise per Session: 30 min  Stress: No Stress Concern Present (03/30/2024)   [employer] of Occupational Health - Occupational Stress Questionnaire    Feeling of Stress: Only a little  Social Connections: Socially Integrated (03/30/2024)   Social Connection and Isolation Panel    Frequency of Communication with Friends and Family: More than three times a week    Frequency of Social Gatherings with Friends and Family: Three times a week    Attends Religious Services: More than 4 times per year    Active Member of Clubs or Organizations: Yes    Attends Engineer, structural: More than 4 times per year    Marital Status: Married    Tobacco Counseling Counseling given: No    Clinical Intake:  Pre-visit preparation completed: Yes  Pain : Faces Pain Score: 6  Faces Pain Scale: Hurts even more Pain Type: Acute pain Pain Location: Back Pain Orientation: Lower Pain Descriptors / Indicators: Aching Effect of Pain on Daily Activities: stretches; takes Tylenol  for pain reliever  Faces Pain Scale: Hurts even more  BMI - recorded: 21.67 Nutritional Status: BMI of 19-24  Normal Nutritional  Risks: None Diabetes: Yes CBG done?: No Did pt. bring in CBG monitor from home?: No  Lab Results  Component Value Date   HGBA1C 5.2 09/15/2023   HGBA1C 4.9 09/07/2023   HGBA1C 5.2 10/05/2022     How often do you need to have someone help you when you read instructions, pamphlets, or other written materials from your doctor or pharmacy?: 1 - Never  Interpreter Needed?: No  Information entered by :: Verdie Saba, CMA   Activities of Daily Living     03/30/2024    8:06 AM 03/17/2024   11:59 AM  In your present state of health, do you have any difficulty performing the following activities:  Hearing? 0   Vision? 0   Difficulty concentrating or making decisions? 0   Walking or climbing stairs? 0   Dressing or bathing? 0   Doing errands, shopping? 0 0  Preparing Food and eating ? N   Using the Toilet? N   In the past six months, have you accidently leaked urine? N   Do you have problems with loss of bowel control? N   Managing your Medications? N   Managing your Finances? N   Housekeeping or managing your Housekeeping? N     Patient Care Team: Joshua [name] CROME, MD as PCP - General (Internal Medicine) Tobie Gordy POUR, MD (Nephrology)  I have updated your Care Teams any recent Medical Services you may have received from other providers in the past year.     Assessment:   This is a routine wellness examination for [name].  Hearing/Vision screen Hearing Screening - Comments:: Denies hearing difficulties   Vision Screening - Comments:: Wears rx glasses - plans to schedule an appt for an eye exam   Goals Addressed               This Visit's Progress     Patient Stated (pt-stated)        Patient stated he plans to continue to manage medical concerns and dialysis       Depression Screen     03/30/2024    8:09 AM 03/22/2024    1:07 PM 11/25/2022  9:22 AM 12/26/2021    3:43 PM 04/18/2020    1:44 PM 06/15/2017    9:45 AM 05/07/2017   12:15 PM  PHQ 2/9 Scores  PHQ -  2 Score 0 0 0 0 0 0 2  PHQ- 9 Score 0 4    0 13    Fall Risk     03/30/2024    8:07 AM 03/22/2024    1:07 PM 11/25/2022    9:22 AM  Fall Risk   Falls in the past year? 1 1 0  Number falls in past yr: 0 0 0  Comment 1    Injury with Fall? 0 1 0  Risk for fall due to :  No Fall Risks No Fall Risks  Follow up Falls evaluation completed;Falls prevention discussed Falls evaluation completed Falls evaluation completed    MEDICARE RISK AT HOME:  Medicare Risk at Home Any stairs in or around the home?: Yes If so, are there any without handrails?: No Home free of loose throw rugs in walkways, pet beds, electrical cords, etc?: Yes Adequate lighting in your home to reduce risk of falls?: Yes Life alert?: No Use of a cane, walker or w/c?: Yes (cane) Grab bars in the bathroom?: Yes Shower chair or bench in shower?: Yes Elevated toilet seat or a handicapped toilet?: No  TIMED UP AND GO:  Was the test performed?  No  Cognitive Function: 6CIT completed        03/30/2024    8:12 AM  6CIT Screen  What Year? 0 points  What month? 0 points  What time? 0 points  Count back from 20 0 points  Months in reverse 0 points  Repeat phrase 0 points  Total Score 0 points    Immunizations Immunization History  Administered Date(s) Administered   Hep A / Hep B 01/30/2011, 01/30/2011, 02/27/2011   Influenza Whole 09/09/2010   Janssen (J&J) SARS-COV-2 Vaccination 01/20/2020   PNEUMOCOCCAL CONJUGATE-20 03/12/2021   Pneumococcal Polysaccharide-23 01/30/2011, 11/09/2013   Tdap 10/30/2010    Screening Tests Health Maintenance  Topic Date Due   Zoster Vaccines- Shingrix  (1 of 2) Never done   COVID-19 Vaccine (2 - Janssen risk series) 02/17/2020   DTaP/Tdap/Td (2 - Td or Tdap) 10/29/2020   FOOT EXAM  12/28/2022   OPHTHALMOLOGY EXAM  07/10/2023   INFLUENZA VACCINE  03/11/2024   HEMOGLOBIN A1C  03/14/2024   Medicare Annual Wellness (AWV)  03/30/2025   Colonoscopy  05/15/2030   Pneumococcal  Vaccine: 50+ Years  Completed   Hepatitis C Screening  Completed   HPV VACCINES  Aged Out   Meningococcal B Vaccine  Aged Out   Pneumococcal Vaccine  Discontinued   Hepatitis B Vaccines 19-59 Average Risk  Discontinued    Health Maintenance  Health Maintenance Due  Topic Date Due   Zoster Vaccines- Shingrix  (1 of 2) Never done   COVID-19 Vaccine (2 - Janssen risk series) 02/17/2020   DTaP/Tdap/Td (2 - Td or Tdap) 10/29/2020   FOOT EXAM  12/28/2022   OPHTHALMOLOGY EXAM  07/10/2023   INFLUENZA VACCINE  03/11/2024   HEMOGLOBIN A1C  03/14/2024   Health Maintenance Items Addressed:  Labs Ordered: Hemoglobin A1C   Additional Screening:  Vision Screening: Recommended annual ophthalmology exams for early detection of glaucoma and other disorders of the eye. Would you like a referral to an eye doctor? No  Patient has not seen an Ophthalmologist or Optometrist in 44yrs.  He plans to schedule an appt by 07/2024 for  a Diabetic eye exam.  Dental Screening: Recommended annual dental exams for proper oral hygiene  Community Resource Referral / Chronic Care Management: CRR required this visit?  No   CCM required this visit?  No   Plan:    I have personally reviewed and noted the following in the patient's chart:   Medical and social history Use of alcohol, tobacco or illicit drugs  Current medications and supplements including opioid prescriptions. Patient is not currently taking opioid prescriptions. Functional ability and status Nutritional status Physical activity Advanced directives List of other physicians Hospitalizations, surgeries, and ER visits in previous 12 months Vitals Screenings to include cognitive, depression, and falls Referrals and appointments  In addition, I have reviewed and discussed with patient certain preventive protocols, quality metrics, and best practice recommendations. A written personalized care plan for preventive services as well as general  preventive health recommendations were provided to patient.   Verdie CHRISTELLA Saba, CMA   03/30/2024   After Visit Summary: (MyChart) Due to this being a telephonic visit, the after visit summary with patients personalized plan was offered to patient via MyChart   Notes: Scheduled a 1-yr Physical w/PCP for 08/15/2024.  Pt currently has dialysis txs.

## 2024-03-30 NOTE — Telephone Encounter (Signed)
 Called the patient he wouldn't identify hisself. He states that he already spoke with somebody from Dr. Joshua office and the referral was sent. I advised him that a referral was NOT sent and he would have to identify himself so that I could discuss further his concerns. He refused and asked for a number to call me back. I gave him the office number and he disconnected the call.

## 2024-03-30 NOTE — Telephone Encounter (Signed)
 Copied from CRM #[id number]. Topic: General - Other >> Mar 30, 2024 10:33 AM Pinkey ORN wrote: Reason for CRM: Returning Office Call >> Mar 30, 2024 10:33 AM Pinkey ORN wrote: Returning [employer] From [place] , Jazunique M, CMA

## 2024-03-31 NOTE — Anesthesia Preprocedure Evaluation (Addendum)
 Anesthesia Evaluation  Patient identified by MRN, date of birth, ID band Patient awake    Reviewed: Allergy & Precautions, NPO status , Patient's Chart, lab work & pertinent test results  History of Anesthesia Complications Negative for: history of anesthetic complications  Airway Mallampati: III  TM Distance: >3 FB Neck ROM: Full    Dental  (+) Dental Advisory Given,    Pulmonary neg pulmonary ROS   Pulmonary exam normal breath sounds clear to auscultation       Cardiovascular hypertension (amlodipine , carvedilol ), Pt. on medications and Pt. on home beta blockers (-) angina + Peripheral Vascular Disease and +CHF (EF 40-45%)  (-) Past MI, (-) Cardiac Stents and (-) CABG + dysrhythmias (prolonged QT) + Valvular Problems/Murmurs (mild MR and AI, severe TR)  Rhythm:Regular Rate:Normal  HLD  TEE 09/23/2023: IMPRESSIONS    1. Left ventricular ejection fraction, by estimation, is 40 to 45%. Left  ventricular ejection fraction by 3D volume is 44 %. The left ventricle has  mildly decreased function.   2. Normal RV Free wall strain. Right ventricular systolic function is  normal. The right ventricular size is mildly enlarged.   3. Left atrial size was severely dilated. No left atrial/left atrial  appendage thrombus was detected. The LAA emptying velocity was 55 cm/s.   4. Right atrial size was severely dilated.   5. Moderate pericardial effusion. The pericardial effusion is posterior  to the left ventricle.   6. Many degnerative changes on the mitral valve. There is a small (2 mm)  hypermobile echodensity on the ventricular surface of the anterior  leaflet. Cannot exclude small vegetation . The mitral valve is  degenerative. Mild mitral valve regurgitation.  Moderate mitral annular calcification.   7. Functional regurgitation. Anterior-septal coaptation gap 3 mm, AP  coaptation gap 4 mm. Tricuspid valve regurgitation is severe.   8.  The aortic valve is tricuspid. Aortic valve regurgitation is mild. No  aortic stenosis is present.   9. The inferior vena cava is dilated in size with <50% respiratory  variability, suggesting right atrial pressure of 15 mmHg.  10. 3D performed of the tricuspid valve and demonstrates Severe tricuspid  regurgitation that is anterior-septal in nature. This is atrial functional  in nature. 3D VCA 0.599 cm2.     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 now on HD again, last HD yesterday)     Musculoskeletal  (+) Arthritis ,    Abdominal   Peds  Hematology  (+) Blood dyscrasia (thrombocytopenia), anemia Lab Results      Component                Value               Date                      WBC                      4.0                 03/17/2024                HGB  13.6                03/17/2024                HCT                      45.6                03/17/2024                MCV                      88.9                03/17/2024                PLT                      112 (L)             03/17/2024              Anesthesia Other Findings   Reproductive/Obstetrics                              Anesthesia Physical Anesthesia Plan  ASA: 3  Anesthesia Plan: MAC   Post-op Pain Management: Tylenol  PO (pre-op)*   Induction: Intravenous  PONV Risk Score and Plan: 1 and Ondansetron , Dexamethasone , Treatment may vary due to age or medical condition, Propofol  infusion and TIVA  Airway Management Planned: Natural Airway and Simple Face Mask  Additional Equipment:   Intra-op Plan:   Post-operative Plan: Extubation in OR  Informed Consent:      Dental advisory given  Plan  Discussed with: CRNA and Anesthesiologist  Anesthesia Plan Comments: (See PAT note from 8/7  Discussed with patient risks of MAC including, but not limited to, minor pain or discomfort, hearing people in the room, and possible need for backup general anesthesia. Risks for general anesthesia also discussed including, but not limited to, sore throat, hoarse voice, chipped/damaged teeth, injury to vocal cords, nausea and vomiting, allergic reactions, lung infection, heart attack, stroke, and death. All questions answered.  )         Anesthesia Quick Evaluation

## 2024-03-31 NOTE — Telephone Encounter (Signed)
 Patient states that he didn't call about a referral.

## 2024-04-05 ENCOUNTER — Ambulatory Visit: Payer: Self-pay

## 2024-04-05 NOTE — Telephone Encounter (Signed)
 FYI Only or Action Required?: FYI only for provider.  Patient was last seen in primary care on 03/22/2024 by Joshua [name] CROME, MD.  Called Nurse Triage reporting Toe Pain.  Symptoms began several weeks ago.  Interventions attempted: Nothing.  Symptoms are: unchanged.  Triage Disposition: See PCP When Office is Open (Within 3 Days)  Patient/caregiver understands and will follow disposition?: Yes  Copied from CRM [id number]. Topic: Clinical - Red Word Triage >> Apr 05, 2024  3:25 PM Viola F wrote: Patient having severe pain in left foot on 3 of his toes - requested appt Reason for Disposition  [1] MODERATE pain (e.g., interferes with normal activities, limping) AND [2] present > 3 days  Answer Assessment - Initial Assessment Questions 1. ONSET: When did the pain start?      About three weeks ago 2. LOCATION: Where is the pain located?   (e.g., around nail, entire toe, at foot joint)      Left foot toe pain, big toe, pinky toe, and the toe beside pinky (1,4,5) 3. PAIN: How bad is the pain?    (Scale 1-10; or mild, moderate, severe)     9/10 4. APPEARANCE: What does the toe look like? (e.g., redness, swelling, bruising, pallor)     Swollen, denies bruising or redness 5. CAUSE: What do you think is causing the toe pain?     Unsure, maybe neuropathy 6. OTHER SYMPTOMS: Do you have any other symptoms? (e.g., leg pain, rash, fever, numbness)     denies 7. PREGNANCY: Is there any chance you are pregnant? When was your last menstrual period?     N/A  Protocols used: Toe Pain-A-AH

## 2024-04-06 ENCOUNTER — Encounter: Payer: Self-pay | Admitting: Internal Medicine

## 2024-04-06 ENCOUNTER — Ambulatory Visit (INDEPENDENT_AMBULATORY_CARE_PROVIDER_SITE_OTHER): Admitting: Internal Medicine

## 2024-04-06 VITALS — BP 138/70 | HR 76 | Temp 97.7°F | Ht 70.0 in | Wt 154.8 lb

## 2024-04-06 DIAGNOSIS — E119 Type 2 diabetes mellitus without complications: Secondary | ICD-10-CM

## 2024-04-06 DIAGNOSIS — L03031 Cellulitis of right toe: Secondary | ICD-10-CM | POA: Diagnosis not present

## 2024-04-06 DIAGNOSIS — L600 Ingrowing nail: Secondary | ICD-10-CM | POA: Diagnosis not present

## 2024-04-06 DIAGNOSIS — L609 Nail disorder, unspecified: Secondary | ICD-10-CM

## 2024-04-06 MED ORDER — TRAMADOL HCL 50 MG PO TABS: 50.0000 mg | ORAL_TABLET | Freq: Four times a day (QID) | ORAL | 0 refills | Status: DC | PRN

## 2024-04-06 MED ORDER — DOXYCYCLINE HYCLATE 100 MG PO TABS: 100.0000 mg | ORAL_TABLET | Freq: Two times a day (BID) | ORAL | 0 refills | Status: DC

## 2024-04-06 NOTE — Progress Notes (Signed)
 Patient ID: [name], male   DOB: 1951/03/24, 73 y.o.   MRN: [id number]        Chief Complaint: follow up right great toe cellulitis, right great toe ingrown nail, other 4th and 5th toe tender pain       HPI:  [name] is a 73 y.o. male here with c/o 3 days gradually worsening right great toe with ingrown nail medially, now with 2+ red, tender, swelling o/w no ulcer or drainage, no red streaks or foot involvement.  Does have some tender soreness of 4th and 5th toes as well but no redness or swelling.  Pt denies chest pain, increased sob or doe, wheezing, orthopnea, PND, increased LE swelling, palpitations, dizziness or syncope.   Pt denies polydipsia, polyuria, or new focal neuro s/s. Pt remains on HD - mon/wed/Friday.  Wt Readings from Last 3 Encounters:  04/06/24 154 lb 12.8 oz (70.2 kg)  03/30/24 151 lb (68.5 kg)  03/22/24 151 lb (68.5 kg)   BP Readings from Last 3 Encounters:  04/06/24 138/70  03/22/24 108/66  03/17/24 97/62         Past Medical History:  Diagnosis Date   Anemia    d/t ESRD   Arthritis    Benign colon polyp 08/11/2010   CHF (congestive heart failure) (HCC)    Clot    STATED TOLD IN APRIL CLOT TO LEFT SHOULDER. 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   HTN (hypertension)    Hyperlipidemia    Neuromuscular disorder (HCC)    neuropathy down LLE   Pneumonia    Seizures (HCC)    Past Surgical History:  Procedure Laterality Date   AV FISTULA PLACEMENT  01/19/2012   Procedure: ARTERIOVENOUS (AV) FISTULA CREATION;  Surgeon: Redell LITTIE Door, MD;  Location: [facility] OR;  Service: Vascular;  Laterality: Right;  Right Brachio-cephalic arteriovenous fistula   AV FISTULA PLACEMENT Right 07/18/2021   Procedure: INSERTION OF RIGHT UPPER EXTREMITY ARTERIOVENOUS (AV) GORE-TEX GRAFT;   Surgeon: Magda [name] SAILOR, MD;  Location: [facility] OR;  Service: Vascular;  Laterality: Right;  PERIPHERAL NERVE BLOCK   AV FISTULA PLACEMENT, RADIOCEPHALIC  12/31/10   Right arm   INSERTION OF DIALYSIS CATHETER  01/19/2012   Procedure: INSERTION OF DIALYSIS CATHETER;  Surgeon: Redell LITTIE Door, MD;  Location: [facility] OR;  Service: Vascular;  Laterality: N/A;  right internal jugular vein   TRANSESOPHAGEAL ECHOCARDIOGRAM (CATH LAB) N/A 09/23/2023   Procedure: TRANSESOPHAGEAL ECHOCARDIOGRAM;  Surgeon: Santo Stanly LABOR, MD;  Location: [facility] INVASIVE CV LAB;  Service: Cardiovascular;  Laterality: N/A;   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   UPPER EXTREMITY VENOGRAPHY Right 06/11/2021   Procedure: UPPER EXTREMITY VENOGRAPHY;  Surgeon: Serene Gaile ORN, MD;  Location: [facility] INVASIVE CV LAB;  Service: Cardiovascular;  Laterality: Right;   VENOGRAM Left 05/30/2021   Procedure: DIAGNOSTIC CENTRAL VENOGRAM;  Surgeon: Lanis Fonda FORBES, MD;  Location: [facility] OR;  Service: Vascular;  Laterality: Left;    reports that he has never smoked. He has never been exposed to tobacco smoke. He has never used smokeless tobacco. He reports current alcohol use of about 1.0 standard drink of alcohol per week. He reports that he does not use drugs. family history includes Diabetes in his father. No  Known Allergies Current Outpatient Medications on File Prior to Visit  Medication Sig Dispense Refill   carvedilol  (COREG ) 25 MG tablet Take 25 mg by mouth 2 (two) times daily with a meal.     cinacalcet  (SENSIPAR ) 30 MG tablet Take 2 tablets (60 mg total) by mouth every Monday, Wednesday, and Friday at 6 PM. 60 tablet 0   polyethylene glycol (MIRALAX  / GLYCOLAX ) 17 g packet Take 17 g by mouth daily. 14 each 0   sevelamer  carbonate (RENVELA ) 800 MG tablet Take 4 tablets by mouth 3 (three) times daily with meals.     sodium polystyrene (KAYEXALATE) powder Take 30 g by mouth 2 (two) times a week. Thursdays Sundays     amLODipine  (NORVASC ) 10 MG  tablet Take 10 mg by mouth at bedtime. (Patient not taking: Reported on 04/06/2024)     No current facility-administered medications on file prior to visit.        ROS:  All others reviewed and negative.  Objective        PE:  BP 138/70   Pulse 76   Temp 97.7 F (36.5 C)   Ht 5' 10 (1.778 m)   Wt 154 lb 12.8 oz (70.2 kg)   SpO2 99%   BMI 22.21 kg/m                 Constitutional: Pt appears in NAD               HENT: Head: NCAT.                Right Ear: External ear normal.                 Left Ear: External ear normal.                Eyes: . Pupils are equal, round, and reactive to light. Conjunctivae and EOM are normal               Nose: without d/c or deformity               Neck: Neck supple. Gross normal ROM               Cardiovascular: Normal rate and regular rhythm.                 Pulmonary/Chest: Effort normal and breath sounds without rales or wheezing.                Abd:  Soft, NT, ND, + BS, no organomegaly               Neurological: Pt is alert. At baseline orientation, motor grossly intact               Skin: Skin is warm. LE edema - none, the right great toe with 2+ red, tender swelling but no ulcer or drainage and o/w neurovascular intact               Psychiatric: Pt behavior is normal without agitation   Micro: none  Cardiac tracings I have personally interpreted today:  none  Pertinent Radiological findings (summarize): none   Lab Results  Component Value Date   WBC 4.0 03/17/2024   HGB 13.6 03/17/2024   HCT 45.6 03/17/2024   PLT 112 (L) 03/17/2024   GLUCOSE 115 (H) 03/22/2024   CHOL 148 03/07/2020   TRIG 81 03/07/2020   HDL 51 03/07/2020   LDLDIRECT 75.0 05/18/2017   LDLCALC  92 03/07/2020   ALT 16 11/20/2023   AST 34 11/20/2023   NA 134 (L) 03/22/2024   K 4.8 03/22/2024   CL 87 (L) 03/22/2024   CREATININE 8.82 (HH) 03/22/2024   BUN 47 (H) 03/22/2024   CO2 32 03/22/2024   TSH 1.57 03/22/2024   PSA 1.07 03/12/2021   INR 1.2 11/20/2023    HGBA1C 5.2 09/15/2023   Assessment/Plan:  [name] is a 73 y.o. Black or African American [2] male with  has a past medical history of Anemia, Arthritis, Benign colon polyp (08/11/2010), CHF (congestive heart failure) (HCC), Clot, Diabetes mellitus without complication (HCC), ESRD (end stage renal disease) (HCC), Gout, Headache(784.0), History of hypoparathyroidism, HTN (hypertension), Hyperlipidemia, Neuromuscular disorder (HCC), Pneumonia, and Seizures (HCC).  Ingrown nail of great toe of left foot Also for podiatry referral for tx  Cellulitis of right toe Mild to mod, for antibx course doxycycline  100 bid course,  to f/u any worsening symptoms or concerns, also for tramadol  prn pain  DM2 (diabetes mellitus, type 2) (HCC) Lab Results  Component Value Date   HGBA1C 5.2 09/15/2023   Stable, pt to continue current medical treatment  - diet, wt control  Followup: Return if symptoms worsen or fail to improve.  Lynwood Rush, MD 04/10/2024 6:51 PM [facility]

## 2024-04-06 NOTE — Patient Instructions (Signed)
 Please take all new medication as prescribed - the antibiotic, and tramadol  for pain  Please continue all other medications as before, and refills have been done if requested.  Please have the pharmacy call with any other refills you may need.  Please keep your appointments with your specialists as you may have planned - HD on Mon Wed Fri  You will be contacted regarding the referral for: podiatry for ingrown nail

## 2024-04-10 ENCOUNTER — Encounter: Payer: Self-pay | Admitting: Internal Medicine

## 2024-04-10 DIAGNOSIS — L03031 Cellulitis of right toe: Secondary | ICD-10-CM | POA: Insufficient documentation

## 2024-04-10 DIAGNOSIS — L609 Nail disorder, unspecified: Secondary | ICD-10-CM | POA: Insufficient documentation

## 2024-04-10 DIAGNOSIS — L600 Ingrowing nail: Secondary | ICD-10-CM | POA: Insufficient documentation

## 2024-04-10 NOTE — Assessment & Plan Note (Signed)
 Lab Results  Component Value Date   HGBA1C 5.2 09/15/2023   Stable, pt to continue current medical treatment  - diet, wt control

## 2024-04-10 NOTE — Assessment & Plan Note (Addendum)
 Mild to mod, for antibx course doxycycline  100 bid course,  to f/u any worsening symptoms or concerns, also for tramadol  prn pain

## 2024-04-10 NOTE — Assessment & Plan Note (Signed)
 Also for podiatry referral for tx

## 2024-04-12 ENCOUNTER — Ambulatory Visit (HOSPITAL_BASED_OUTPATIENT_CLINIC_OR_DEPARTMENT_OTHER): Payer: Self-pay | Admitting: Medical

## 2024-04-12 ENCOUNTER — Ambulatory Visit (HOSPITAL_COMMUNITY)
Admission: RE | Admit: 2024-04-12 | Discharge: 2024-04-12 | Disposition: A | Discharge status: Home / Self Care | Attending: Urology | Admitting: Urology

## 2024-04-12 ENCOUNTER — Encounter (HOSPITAL_COMMUNITY)
Admission: RE | Disposition: A | Discharge status: Home / Self Care | Payer: Self-pay | Source: Home / Self Care | Attending: Urology

## 2024-04-12 ENCOUNTER — Ambulatory Visit (HOSPITAL_COMMUNITY): Payer: Self-pay | Admitting: Medical

## 2024-04-12 ENCOUNTER — Encounter (HOSPITAL_COMMUNITY): Payer: Self-pay | Admitting: Urology

## 2024-04-12 ENCOUNTER — Ambulatory Visit (HOSPITAL_COMMUNITY)

## 2024-04-12 DIAGNOSIS — I132 Hypertensive heart and chronic kidney disease with heart failure and with stage 5 chronic kidney disease, or end stage renal disease: Secondary | ICD-10-CM

## 2024-04-12 DIAGNOSIS — E1122 Type 2 diabetes mellitus with diabetic chronic kidney disease: Secondary | ICD-10-CM | POA: Insufficient documentation

## 2024-04-12 DIAGNOSIS — E209 Hypoparathyroidism, unspecified: Secondary | ICD-10-CM | POA: Insufficient documentation

## 2024-04-12 DIAGNOSIS — N35011 Post-traumatic bulbous urethral stricture: Secondary | ICD-10-CM | POA: Insufficient documentation

## 2024-04-12 DIAGNOSIS — Z940 Kidney transplant status: Secondary | ICD-10-CM | POA: Diagnosis not present

## 2024-04-12 DIAGNOSIS — N186 End stage renal disease: Secondary | ICD-10-CM | POA: Diagnosis not present

## 2024-04-12 DIAGNOSIS — Z992 Dependence on renal dialysis: Secondary | ICD-10-CM | POA: Insufficient documentation

## 2024-04-12 DIAGNOSIS — E785 Hyperlipidemia, unspecified: Secondary | ICD-10-CM | POA: Insufficient documentation

## 2024-04-12 DIAGNOSIS — I083 Combined rheumatic disorders of mitral, aortic and tricuspid valves: Secondary | ICD-10-CM | POA: Diagnosis not present

## 2024-04-12 DIAGNOSIS — M1990 Unspecified osteoarthritis, unspecified site: Secondary | ICD-10-CM | POA: Insufficient documentation

## 2024-04-12 DIAGNOSIS — I5032 Chronic diastolic (congestive) heart failure: Secondary | ICD-10-CM | POA: Diagnosis not present

## 2024-04-12 DIAGNOSIS — K219 Gastro-esophageal reflux disease without esophagitis: Secondary | ICD-10-CM | POA: Insufficient documentation

## 2024-04-12 DIAGNOSIS — I509 Heart failure, unspecified: Secondary | ICD-10-CM | POA: Diagnosis not present

## 2024-04-12 LAB — BASIC METABOLIC PANEL WITH GFR
Anion gap: 22 — ABNORMAL HIGH (ref 5–15)
BUN: 42 mg/dL — ABNORMAL HIGH (ref 8–23)
CO2: 24 mmol/L (ref 22–32)
Calcium: 10.8 mg/dL — ABNORMAL HIGH (ref 8.9–10.3)
Chloride: 90 mmol/L — ABNORMAL LOW (ref 98–111)
Creatinine, Ser: 7.5 mg/dL — ABNORMAL HIGH (ref 0.61–1.24)
GFR, Estimated: 7 mL/min — ABNORMAL LOW (ref >60)
Glucose, Bld: 75 mg/dL (ref 70–99)
Potassium: 5.6 mmol/L — ABNORMAL HIGH (ref 3.5–5.1)
Sodium: 137 mmol/L (ref 135–145)

## 2024-04-12 SURGERY — CYSTOURETHROSCOPY, WITH URETHRAL STRICTURE DILATION USING DRUG-COATED BALLOON
Anesthesia: General

## 2024-04-12 MED ORDER — SODIUM CHLORIDE 0.9 % IV SOLN: INTRAVENOUS | Status: DC

## 2024-04-12 MED ORDER — TRAMADOL HCL 50 MG PO TABS
50.0000 mg | ORAL_TABLET | Freq: Four times a day (QID) | ORAL | 0 refills | Status: DC | PRN
Start: 2024-04-12 — End: 2024-05-24

## 2024-04-12 MED ORDER — OXYCODONE HCL 5 MG/5ML PO SOLN: 5.0000 mg | Freq: Once | ORAL | Status: DC | PRN

## 2024-04-12 MED ORDER — PROPOFOL 10 MG/ML IV BOLUS
INTRAVENOUS | Status: AC
  Filled 2024-04-12: qty 20

## 2024-04-12 MED ORDER — FENTANYL CITRATE PF 50 MCG/ML IJ SOSY: 25.0000 ug | PREFILLED_SYRINGE | INTRAMUSCULAR | Status: DC | PRN

## 2024-04-12 MED ORDER — FENTANYL CITRATE (PF) 100 MCG/2ML IJ SOLN
INTRAMUSCULAR | Status: AC
Start: 2024-04-12 — End: 2024-04-12
  Filled 2024-04-12: qty 2

## 2024-04-12 MED ORDER — OXYCODONE HCL 5 MG PO TABS: 5.0000 mg | ORAL_TABLET | Freq: Once | ORAL | Status: DC | PRN

## 2024-04-12 MED ORDER — ACETAMINOPHEN 500 MG PO TABS
1000.0000 mg | ORAL_TABLET | Freq: Once | ORAL | Status: AC
  Administered 2024-04-12: 1000 mg via ORAL
  Filled 2024-04-12: qty 2

## 2024-04-12 MED ORDER — CEFAZOLIN SODIUM-DEXTROSE 2-4 GM/100ML-% IV SOLN
2.0000 g | INTRAVENOUS | Status: DC
  Filled 2024-04-12: qty 100

## 2024-04-12 MED ORDER — STERILE WATER FOR IRRIGATION IR SOLN
Status: DC | PRN
  Administered 2024-04-12: 3000 mL

## 2024-04-12 MED ORDER — IOHEXOL 300 MG/ML  SOLN
INTRAMUSCULAR | Status: DC | PRN
  Administered 2024-04-12: 10 mL

## 2024-04-12 MED ORDER — PROPOFOL 10 MG/ML IV BOLUS
INTRAVENOUS | Status: DC | PRN
  Administered 2024-04-12: 50 mg via INTRAVENOUS

## 2024-04-12 MED ORDER — FENTANYL CITRATE (PF) 100 MCG/2ML IJ SOLN
INTRAMUSCULAR | Status: DC | PRN
  Administered 2024-04-12: 25 ug via INTRAVENOUS

## 2024-04-12 MED ORDER — ONDANSETRON HCL 4 MG/2ML IJ SOLN
INTRAMUSCULAR | Status: DC | PRN
  Administered 2024-04-12: 4 mg via INTRAVENOUS

## 2024-04-12 MED ORDER — SODIUM CHLORIDE 0.9 % IV SOLN: INTRAVENOUS | Status: DC | PRN

## 2024-04-12 MED ORDER — PROPOFOL 500 MG/50ML IV EMUL
INTRAVENOUS | Status: DC | PRN
  Administered 2024-04-12: 125 ug/kg/min via INTRAVENOUS

## 2024-04-12 SURGICAL SUPPLY — 20 items
BAG URINE DRAIN 2000ML AR STRL (UROLOGICAL SUPPLIES) ×1 IMPLANT
BALLOON NEPHROSTOMY (BALLOONS) IMPLANT
BALLOON OPTILUME DCB 30X5X75 (BALLOONS) IMPLANT
CATH FOLEY 2W COUNCIL 20FR 5CC (CATHETERS) IMPLANT
CATH FOLEY 2W COUNCIL 5CC 16FR (CATHETERS) IMPLANT
CATH ROBINSON RED A/P 14FR (CATHETERS) IMPLANT
CATH URET 5FR 70CM CONE TIP (BALLOONS) IMPLANT
CATH URETL OPEN END 6FR 70 (CATHETERS) IMPLANT
CLOTH BEACON ORANGE TIMEOUT ST (SAFETY) ×1 IMPLANT
DEVICE INFLATION ATRION QL4015 (MISCELLANEOUS) IMPLANT
GLOVE BIO SURGEON STRL SZ 6.5 (GLOVE) ×1 IMPLANT
GOWN STRL REUS W/ TWL LRG LVL3 (GOWN DISPOSABLE) ×1 IMPLANT
GUIDEWIRE ANG ZIPWIRE 038X150 (WIRE) IMPLANT
GUIDEWIRE STR DUAL SENSOR (WIRE) IMPLANT
HOLDER FOLEY CATH W/STRAP (MISCELLANEOUS) IMPLANT
KIT TURNOVER KIT A (KITS) ×1 IMPLANT
MANIFOLD NEPTUNE II (INSTRUMENTS) IMPLANT
NS IRRIG 1000ML POUR BTL (IV SOLUTION) IMPLANT
PACK CYSTO (CUSTOM PROCEDURE TRAY) ×1 IMPLANT
WATER STERILE IRR 3000ML UROMA (IV SOLUTION) ×1 IMPLANT

## 2024-04-12 NOTE — Discharge Instructions (Addendum)
 Cystoscopy patient instructions  Following a cystoscopy, a catheter (a flexible rubber tube) is sometimes left in place to empty the bladder. This may cause some discomfort or a feeling that you need to urinate. Your doctor determines the period of time that the catheter will be left in place. You may have bloody urine for two to three days (Call your doctor if the amount of bleeding increases or does not subside).  You may pass blood clots in your urine, especially if you had a biopsy. It is not unusual to pass small blood clots and have some bloody urine a couple of weeks after your cystoscopy. Again, call your doctor if the bleeding does not subside. You may have: Dysuria (painful urination) Frequency (urinating often) Urgency (strong desire to urinate)  These symptoms are common especially if medicine is instilled into the bladder or a ureteral stent is placed. Avoiding alcohol and caffeine, such as coffee, tea, and chocolate, may help relieve these symptoms. Drink plenty of water , unless otherwise instructed. Your doctor may also prescribe an antibiotic or other medicine to reduce these symptoms.  Cystoscopy results are available soon after the procedure; biopsy results usually take two to four days. Your doctor will discuss the results of your exam with you. Before you go home, you will be given specific instructions for follow-up care. Special Instructions:   If you are going home with a catheter in place do not take a tub bath until removed by your doctor.   You may resume your normal activities.   Do not drive or operate machinery if you are taking narcotic pain medicine.   Be sure to keep all follow-up appointments with your doctor.   Call Your Doctor If: The catheter is not draining You have severe pain You are unable to urinate You have a fever over 101 You have severe bleeding         You must wear a condom for the next 4 weeks during sexual activity

## 2024-04-12 NOTE — Transfer of Care (Signed)
 Immediate Anesthesia Transfer of Care Note  Patient: [name]  Procedure(s) Performed: CYSTOURETHROSCOPY, WITH URETHRAL STRICTURE DILATION USING DRUG-COATED BALLOON  Patient Location: PACU  Anesthesia Type:MAC  Level of Consciousness: awake and alert   Airway & Oxygen Therapy: Patient Spontanous Breathing and Patient connected to nasal cannula oxygen  Post-op Assessment: Report given to RN and Post -op Vital signs reviewed and stable  Post vital signs: Reviewed and stable  Last Vitals:  Vitals Value Taken Time  BP 123/76 04/12/24 13:11  Temp    Pulse    Resp 17 04/12/24 13:12  SpO2    Vitals shown include unfiled device data.  Last Pain:  Vitals:   04/12/24 1045  TempSrc:   PainSc: 0-No pain         Complications: No notable events documented.

## 2024-04-12 NOTE — Op Note (Signed)
 Operative Note  Preoperative diagnosis:  1.  Bulbar urethral stricture  Postoperative diagnosis: 1.  Bulbar urethral stricture  Procedure(s): 1.  Cystoscopy with urethral dilation (Optilume)  Surgeon: Valli Shank, MD  Assistants:  None  Anesthesia:  MAC  Complications:  None  EBL:  minimal  Specimens: 1. none  Drains/Catheters: 1.  16Fr council tip foley  Intraoperative findings:   Dense bulbar urethral stricture Lateral lobe prostatic hypertrophy Bilateral orthotopic Uos with transplant UO right superior wall Normal bladder mucosa  Indication:  [name] is a 73 y.o. male with bulbar urethral stricture.  He has a history of urethral stricture and is currently being evaluated for another kidney transplant.    Description of procedure:  After risks benefits of the procedure were discussed, informed consent was obtained.  Patient was taken to the operating room placed in the supine position.  Anesthesia was induced and antibiotics were administered.  He was then repositioned in the dorsolithotomy position.  He was prepped and draped in the usual sterile fashion a timeout is performed.  A 21 French rigid cystoscope was placed in the urethral meatus and advanced to the level of the stricture.  A 0.38 sensor wire was then used to cannulate this area was advanced to the bladder with fluoroscopic guidance.  The Ultrex urethral balloon dilator was then advanced over the wire send the radiopaque marker spanned the strictured area.  It was inflated to 12 mmHg and kept in place for 1 minute.  It was then deflated and removed.  Diagnostic cystoscopy then took place and was able to traverse the strictured area.  There is no abnormal bladder mucosa noted.  The transplant UO from the previous drain site was visualized in the right upper dome.  The cystoscope was removed and calculi wire were placed.  Next the Optilume urethral balloon dilator was advanced over the wire so again the  radiopaque marker spanned the strictured area.  It was inflated to 8 mm green, placed for 5 minutes.  It was then deflated and a 16 [place] council tip Foley was advanced over the wire and inflated with 10 cc sterile water .  The wire was removed.  The patient emerged from anesthesia and was transferred to the PACU in stable condition.  Plan:  Foley to be removed in 3 days

## 2024-04-12 NOTE — H&P (Signed)
 Office Visit Report     03/04/2024   --------------------------------------------------------------------------------   [name]  MRN: [id number]  DOB: 02-19-1951, 73 year old Male  SSN:    PRIMARY CARE:  [name] FREDRIK Molt, MD  PRIMARY CARE FAX:  [phone]  REFERRING:  Katheryn Saba, MD  PROVIDER:  Valli Shank, M.D.  LOCATION:  [facility] [phone]     --------------------------------------------------------------------------------   CC/HPI: cc: urethral stricture   01/01/2024: 73 year old man who was last seen at [facility] urology in 2019 for urethral stricture. He previously has undergone urethral dilation as well as DVIU in 2018. At the time he was last seen he declined definitive treatment of his stricture. He has a history of a renal transplant. He is currently on dialysis again and has been since 2021. He is being evaluated at [facility] for. He was last seen at [facility] urology and underwent urethral dilation in September 2021. He does not make any urine.   IPSS: 0, 0, 0, 0, 0, 0, 5,0= 5/35  3/6 mixed quality-of-life   03/04/24: Here for cysto to eval for urethral stricture. He does not make urine but hoping to have renal transplant.     ALLERGIES: Hydrocodone    MEDICATIONS: amLODIPine  Besylate 5 MG Tablet  Atorvastatin Calcium  10 MG Tablet  Coreg  12.5 MG Tablet  hydrALAZINE  HCl 25 MG Tablet  Labetalol  HCl 200 MG Tablet  Losartan  Potassium 50 MG Tablet  Magnesium Oxide 400     GU PSH: Complex Uroflow - 2019 Cysto Dilate Stricture (M or F) - 2015 Cystoscopy VIU - 2017       PSH Notes: Multiple vascular access procedures.    NON-GU PSH: Transplant Kidney - 2015     GU PMH: Bulbar urethral stricture - 01/01/2024, He has multilevel urethral stricture disease with a reduced stream and some urgency with UUI. I reviewed the treatment options with him again including urethroplasty w/wo spt, perineal urethrostomy or dilation with self  cath to maintain patency. He would like to monitor the condition for now and I will have him return in 4 months for a flowrate and PVR. 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 - 2019 Kidney Failure Unspec    NON-GU PMH: Bacteriuria, He has pyuria with some bacteria in the urine today. I will get a culture but think it is more inflammatory than infectious. - 2019 Diabetes Type 2 GERD Gout Hypercholesterolemia Hypertension    FAMILY HISTORY: 1 son - Son 4 daughters - Daughter Arthritis - Father Diabetes - Runs in Family respiratory failure - Mother   SOCIAL HISTORY: Marital Status: Married Preferred Language: English; Race: Black or African American Current Smoking Status: Patient has never smoked.   Tobacco Use Assessment Completed: Used Tobacco in last 30 days? Drinks 1 drink per month.      Notes: 1 son, 4 daughters Retired Freight forwarder.    REVIEW OF SYSTEMS:    GU Review Male:   Patient denies frequent urination, hard to postpone urination, burning/ pain with urination, get up at night to urinate, leakage of urine, stream starts and stops, trouble starting your stream, have to strain to urinate , erection problems, and penile pain.  Gastrointestinal (Upper):   Patient denies nausea, vomiting, and indigestion/ heartburn.  Gastrointestinal (Lower):   Patient denies diarrhea and constipation.  Constitutional:   Patient denies  fever, night sweats, weight loss, and fatigue.  Skin:   Patient denies skin rash/ lesion and itching.  Eyes:   Patient denies blurred vision and double vision.  Ears/ Nose/ Throat:   Patient denies sore throat and sinus problems.  Hematologic/Lymphatic:   Patient denies swollen glands and easy bruising.  Cardiovascular:   Patient denies chest pains and leg swelling.  Respiratory:   Patient denies cough and  shortness of breath.  Endocrine:   Patient denies excessive thirst.  Musculoskeletal:   Patient denies back pain and joint pain.  Neurological:   Patient denies headaches and dizziness.  Psychologic:   Patient denies depression and anxiety.   VITAL SIGNS: None   GU PHYSICAL EXAMINATION:    Urethral Meatus: Normal size. No lesion, no wart, no discharge, no polyp. Normal location.   MULTI-SYSTEM PHYSICAL EXAMINATION:    Constitutional: Well-nourished. No physical deformities. Normally developed. Good grooming.  Neck: Neck symmetrical, not swollen. Normal tracheal position.  Respiratory: No labored breathing, no use of accessory muscles.   Skin: No paleness, no jaundice, no cyanosis. No lesion, no ulcer, no rash.  Neurologic / Psychiatric: Oriented to time, oriented to place, oriented to person. No depression, no anxiety, no agitation.  Eyes: Normal conjunctivae. Normal eyelids.  Ears, Nose, Mouth, and Throat: Left ear no scars, no lesions, no masses. Right ear no scars, no lesions, no masses. Nose no scars, no lesions, no masses. Normal hearing. Normal lips.  Musculoskeletal: Normal gait and station of head and neck.     Complexity of Data:  Records Review:   Previous Patient Records, POC Tool   PROCEDURES:         Flexible Cystoscopy - 52000  Risks, benefits, and some of the potential complications of the procedure were discussed at length with the patient including infection, bleeding, voiding discomfort, urinary retention, fever, chills, sepsis, and others. All questions were answered. Informed consent was obtained. Sterile technique and intraurethral analgesia were used.  Meatus:  Normal size. Normal location. Normal condition.  Urethra:  Moderate bulbous stricture.  External Sphincter:  Normal.      The lower urinary tract was carefully examined. The procedure was well-tolerated and without complications. Antibiotic instructions were given. Instructions were given to call the  office immediately for bloody urine, difficulty urinating, urinary retention, painful or frequent urination, fever, chills, nausea, vomiting or other illness. The patient stated that he understood these instructions and would comply with them.   ASSESSMENT:      ICD-10 Details  1 GU:   Bulbar urethral stricture - N35.011 Undiagnosed New Problem   PLAN:           Document Letter(s):  Created for Patient: Clinical Summary         Notes:   1. Bulbar urethral stricture:  -discussed moving forward with optilume urethral dilation  -risks and benefits discussed   Schedule next available surgery date around dialysis days (MWF)

## 2024-04-13 NOTE — Anesthesia Postprocedure Evaluation (Signed)
 Anesthesia Post Note  Patient: [name]  Procedure(s) Performed: CYSTOURETHROSCOPY, WITH URETHRAL STRICTURE DILATION USING DRUG-COATED BALLOON     Patient location during evaluation: PACU Anesthesia Type: MAC Level of consciousness: awake Pain management: pain level controlled Vital Signs Assessment: post-procedure vital signs reviewed and stable Respiratory status: spontaneous breathing, nonlabored ventilation and respiratory function stable Cardiovascular status: stable and blood pressure returned to baseline Postop Assessment: no apparent nausea or vomiting Anesthetic complications: no   No notable events documented.  Last Vitals:  Vitals:   04/12/24 1400 04/12/24 1415  BP: 137/81 (!) 153/84  Pulse: 80   Resp: 15 10  Temp: (!) 36.4 C   SpO2: 96% 98%    Last Pain:  Vitals:   04/12/24 1415  TempSrc:   PainSc: 0-No pain                 Delon Aisha Arch

## 2024-04-20 ENCOUNTER — Ambulatory Visit (INDEPENDENT_AMBULATORY_CARE_PROVIDER_SITE_OTHER)

## 2024-04-20 ENCOUNTER — Encounter: Payer: Self-pay | Admitting: Podiatry

## 2024-04-20 ENCOUNTER — Ambulatory Visit: Admitting: Podiatry

## 2024-04-20 DIAGNOSIS — M7662 Achilles tendinitis, left leg: Secondary | ICD-10-CM | POA: Diagnosis not present

## 2024-04-20 DIAGNOSIS — L03032 Cellulitis of left toe: Secondary | ICD-10-CM

## 2024-04-20 DIAGNOSIS — L02612 Cutaneous abscess of left foot: Secondary | ICD-10-CM

## 2024-04-20 NOTE — Patient Instructions (Addendum)

## 2024-05-10 NOTE — Progress Notes (Signed)
  Subjective:     Patient ID: [name], male   DOB: 24-May-1951, 73 y.o.   MRN: [id number]  HPI patient presents with irritation of the left big toenail with redness and pain and states that he has had a transplant in his overall good health   Review of Systems  All other systems reviewed and are negative.      Objective:   Physical Exam Vitals and nursing note reviewed.  Constitutional:      Appearance: He is well-developed.  Pulmonary:     Effort: Pulmonary effort is normal.  Musculoskeletal:        General: Normal range of motion.  Skin:    General: Skin is warm.  Neurological:     Mental Status: He is alert.   Neurovascular status was found to be intact muscle strength is adequate range of motion slightly diminished subtalar midtarsal joint.  Patient has inflammation posterior heel left that he states is tender at times and has a red irritated left big toenail medial border localized no proximal edema erythema or drainage noted with slight drainage and redness just within the nailbed.  Good digital perfusion well-oriented     Assessment:     Low-grade paronychia of the left hallux medial border along with Achilles tendinitis    Plan:     H&P reviewed I do think we can take a simple approach with this and I anesthetized the left big toe 60 mg like Marcaine mixture sterile prep and using sterile instrumentation removed the border flushed the area applied sterile dressing and instructed on soaks at home.  Strict instructions of any changes were to occur any pathology to reappoint immediately this should heal uneventfully with no issues.  Achilles he can just do stretching exercises and if any worsening of symptoms he is to reappoint

## 2024-05-20 ENCOUNTER — Other Ambulatory Visit: Payer: Self-pay | Admitting: *Deleted

## 2024-05-20 DIAGNOSIS — I739 Peripheral vascular disease, unspecified: Secondary | ICD-10-CM

## 2024-05-22 ENCOUNTER — Encounter (HOSPITAL_COMMUNITY): Payer: Self-pay

## 2024-05-22 ENCOUNTER — Other Ambulatory Visit: Payer: Self-pay

## 2024-05-22 ENCOUNTER — Inpatient Hospital Stay (HOSPITAL_COMMUNITY)
Admission: EM | Admit: 2024-05-22 | Discharge: 2024-05-25 | DRG: 278 | Disposition: A | Discharge status: Home / Self Care | Attending: Student | Admitting: Student

## 2024-05-22 ENCOUNTER — Emergency Department (HOSPITAL_COMMUNITY)

## 2024-05-22 DIAGNOSIS — N189 Chronic kidney disease, unspecified: Secondary | ICD-10-CM

## 2024-05-22 DIAGNOSIS — I70212 Atherosclerosis of native arteries of extremities with intermittent claudication, left leg: Secondary | ICD-10-CM

## 2024-05-22 DIAGNOSIS — D631 Anemia in chronic kidney disease: Secondary | ICD-10-CM | POA: Diagnosis present

## 2024-05-22 DIAGNOSIS — E209 Hypoparathyroidism, unspecified: Secondary | ICD-10-CM | POA: Diagnosis present

## 2024-05-22 DIAGNOSIS — Z833 Family history of diabetes mellitus: Secondary | ICD-10-CM

## 2024-05-22 DIAGNOSIS — I132 Hypertensive heart and chronic kidney disease with heart failure and with stage 5 chronic kidney disease, or end stage renal disease: Secondary | ICD-10-CM | POA: Diagnosis present

## 2024-05-22 DIAGNOSIS — Z992 Dependence on renal dialysis: Secondary | ICD-10-CM

## 2024-05-22 DIAGNOSIS — E1151 Type 2 diabetes mellitus with diabetic peripheral angiopathy without gangrene: Secondary | ICD-10-CM | POA: Diagnosis not present

## 2024-05-22 DIAGNOSIS — I70222 Atherosclerosis of native arteries of extremities with rest pain, left leg: Secondary | ICD-10-CM | POA: Diagnosis present

## 2024-05-22 DIAGNOSIS — M79605 Pain in left leg: Secondary | ICD-10-CM

## 2024-05-22 DIAGNOSIS — G4733 Obstructive sleep apnea (adult) (pediatric): Secondary | ICD-10-CM | POA: Diagnosis present

## 2024-05-22 DIAGNOSIS — E119 Type 2 diabetes mellitus without complications: Secondary | ICD-10-CM

## 2024-05-22 DIAGNOSIS — M79662 Pain in left lower leg: Secondary | ICD-10-CM

## 2024-05-22 DIAGNOSIS — L97329 Non-pressure chronic ulcer of left ankle with unspecified severity: Secondary | ICD-10-CM | POA: Diagnosis not present

## 2024-05-22 DIAGNOSIS — I10 Essential (primary) hypertension: Secondary | ICD-10-CM | POA: Diagnosis not present

## 2024-05-22 DIAGNOSIS — M109 Gout, unspecified: Secondary | ICD-10-CM | POA: Diagnosis present

## 2024-05-22 DIAGNOSIS — G40909 Epilepsy, unspecified, not intractable, without status epilepticus: Secondary | ICD-10-CM | POA: Diagnosis present

## 2024-05-22 DIAGNOSIS — E1159 Type 2 diabetes mellitus with other circulatory complications: Secondary | ICD-10-CM

## 2024-05-22 DIAGNOSIS — L97309 Non-pressure chronic ulcer of unspecified ankle with unspecified severity: Secondary | ICD-10-CM | POA: Diagnosis present

## 2024-05-22 DIAGNOSIS — Y838 Other surgical procedures as the cause of abnormal reaction of the patient, or of later complication, without mention of misadventure at the time of the procedure: Secondary | ICD-10-CM | POA: Diagnosis present

## 2024-05-22 DIAGNOSIS — E118 Type 2 diabetes mellitus with unspecified complications: Secondary | ICD-10-CM | POA: Diagnosis present

## 2024-05-22 DIAGNOSIS — I70243 Atherosclerosis of native arteries of left leg with ulceration of ankle: Secondary | ICD-10-CM | POA: Diagnosis not present

## 2024-05-22 DIAGNOSIS — I70244 Atherosclerosis of native arteries of left leg with ulceration of heel and midfoot: Secondary | ICD-10-CM | POA: Diagnosis present

## 2024-05-22 DIAGNOSIS — Z860100 Personal history of colon polyps, unspecified: Secondary | ICD-10-CM

## 2024-05-22 DIAGNOSIS — Z862 Personal history of diseases of the blood and blood-forming organs and certain disorders involving the immune mechanism: Secondary | ICD-10-CM

## 2024-05-22 DIAGNOSIS — N186 End stage renal disease: Secondary | ICD-10-CM | POA: Diagnosis present

## 2024-05-22 DIAGNOSIS — R252 Cramp and spasm: Secondary | ICD-10-CM | POA: Diagnosis present

## 2024-05-22 DIAGNOSIS — T8612 Kidney transplant failure: Secondary | ICD-10-CM | POA: Diagnosis present

## 2024-05-22 DIAGNOSIS — I5042 Chronic combined systolic (congestive) and diastolic (congestive) heart failure: Secondary | ICD-10-CM | POA: Diagnosis present

## 2024-05-22 DIAGNOSIS — E1142 Type 2 diabetes mellitus with diabetic polyneuropathy: Secondary | ICD-10-CM | POA: Diagnosis present

## 2024-05-22 DIAGNOSIS — E1122 Type 2 diabetes mellitus with diabetic chronic kidney disease: Secondary | ICD-10-CM | POA: Diagnosis not present

## 2024-05-22 DIAGNOSIS — Z79899 Other long term (current) drug therapy: Secondary | ICD-10-CM

## 2024-05-22 DIAGNOSIS — I739 Peripheral vascular disease, unspecified: Secondary | ICD-10-CM | POA: Diagnosis present

## 2024-05-22 DIAGNOSIS — E785 Hyperlipidemia, unspecified: Secondary | ICD-10-CM | POA: Diagnosis present

## 2024-05-22 DIAGNOSIS — E11621 Type 2 diabetes mellitus with foot ulcer: Secondary | ICD-10-CM

## 2024-05-22 DIAGNOSIS — I5032 Chronic diastolic (congestive) heart failure: Secondary | ICD-10-CM | POA: Diagnosis present

## 2024-05-22 DIAGNOSIS — I779 Disorder of arteries and arterioles, unspecified: Principal | ICD-10-CM

## 2024-05-22 DIAGNOSIS — E7800 Pure hypercholesterolemia, unspecified: Secondary | ICD-10-CM

## 2024-05-22 LAB — CBC WITH DIFFERENTIAL/PLATELET
Abs Immature Granulocytes: 0.02 K/uL (ref 0.00–0.07)
Basophils Absolute: 0 K/uL (ref 0.0–0.1)
Basophils Relative: 1 %
Eosinophils Absolute: 0.1 K/uL (ref 0.0–0.5)
Eosinophils Relative: 1 %
HCT: 35.9 % — ABNORMAL LOW (ref 39.0–52.0)
Hemoglobin: 11.3 g/dL — ABNORMAL LOW (ref 13.0–17.0)
Immature Granulocytes: 0 %
Lymphocytes Relative: 22 %
Lymphs Abs: 1.5 K/uL (ref 0.7–4.0)
MCH: 29.3 pg (ref 26.0–34.0)
MCHC: 31.5 g/dL (ref 30.0–36.0)
MCV: 93 fL (ref 80.0–100.0)
Monocytes Absolute: 0.7 K/uL (ref 0.1–1.0)
Monocytes Relative: 10 %
Neutro Abs: 4.4 K/uL (ref 1.7–7.7)
Neutrophils Relative %: 66 %
Platelets: 179 K/uL (ref 150–400)
RBC: 3.86 MIL/uL — ABNORMAL LOW (ref 4.22–5.81)
RDW: 14.2 % (ref 11.5–15.5)
WBC: 6.6 K/uL (ref 4.0–10.5)
nRBC: 0 % (ref 0.0–0.2)

## 2024-05-22 LAB — PROTIME-INR
INR: 1.1 (ref 0.8–1.2)
Prothrombin Time: 14.9 s (ref 11.4–15.2)

## 2024-05-22 LAB — COMPREHENSIVE METABOLIC PANEL WITH GFR
ALT: 12 U/L (ref 0–44)
AST: 20 U/L (ref 15–41)
Albumin: 3.2 g/dL — ABNORMAL LOW (ref 3.5–5.0)
Alkaline Phosphatase: 111 U/L (ref 38–126)
Anion gap: 25 — ABNORMAL HIGH (ref 5–15)
BUN: 54 mg/dL — ABNORMAL HIGH (ref 8–23)
CO2: 26 mmol/L (ref 22–32)
Calcium: 8.9 mg/dL (ref 8.9–10.3)
Chloride: 91 mmol/L — ABNORMAL LOW (ref 98–111)
Creatinine, Ser: 9.56 mg/dL — ABNORMAL HIGH (ref 0.61–1.24)
GFR, Estimated: 5 mL/min — ABNORMAL LOW (ref >60)
Glucose, Bld: 112 mg/dL — ABNORMAL HIGH (ref 70–99)
Potassium: 4.8 mmol/L (ref 3.5–5.1)
Sodium: 142 mmol/L (ref 135–145)
Total Bilirubin: 0.8 mg/dL (ref 0.0–1.2)
Total Protein: 7.8 g/dL (ref 6.5–8.1)

## 2024-05-22 LAB — HEPATITIS B SURFACE ANTIGEN: Hepatitis B Surface Ag: NONREACTIVE

## 2024-05-22 LAB — GLUCOSE, CAPILLARY
Glucose-Capillary: 101 mg/dL — ABNORMAL HIGH (ref 70–99)
Glucose-Capillary: 162 mg/dL — ABNORMAL HIGH (ref 70–99)

## 2024-05-22 LAB — VAS US ABI WITH/WO TBI

## 2024-05-22 LAB — I-STAT CG4 LACTIC ACID, ED
Lactic Acid, Venous: 1.2 mmol/L (ref 0.5–1.9)
Lactic Acid, Venous: 1.7 mmol/L (ref 0.5–1.9)

## 2024-05-22 LAB — MAGNESIUM: Magnesium: 2.6 mg/dL — ABNORMAL HIGH (ref 1.7–2.4)

## 2024-05-22 MED ORDER — CARVEDILOL 25 MG PO TABS
25.0000 mg | ORAL_TABLET | Freq: Two times a day (BID) | ORAL | Status: DC
  Administered 2024-05-22 – 2024-05-24 (×5): 25 mg via ORAL
  Filled 2024-05-22 (×5): qty 1

## 2024-05-22 MED ORDER — CHLORHEXIDINE GLUCONATE CLOTH 2 % EX PADS
6.0000 | MEDICATED_PAD | Freq: Every day | CUTANEOUS | Status: DC
  Administered 2024-05-23: 6 via TOPICAL

## 2024-05-22 MED ORDER — ATORVASTATIN CALCIUM 10 MG PO TABS
10.0000 mg | ORAL_TABLET | Freq: Every day | ORAL | Status: DC
  Administered 2024-05-24: 10 mg via ORAL
  Filled 2024-05-22: qty 1

## 2024-05-22 MED ORDER — INSULIN ASPART 100 UNIT/ML IJ SOLN: 0.0000 [IU] | Freq: Three times a day (TID) | INTRAMUSCULAR | Status: DC

## 2024-05-22 MED ORDER — OXYCODONE-ACETAMINOPHEN 5-325 MG PO TABS
1.0000 | ORAL_TABLET | Freq: Once | ORAL | Status: AC
  Administered 2024-05-22: 1 via ORAL
  Filled 2024-05-22: qty 1

## 2024-05-22 MED ORDER — ACETAMINOPHEN 650 MG RE SUPP: 650.0000 mg | Freq: Four times a day (QID) | RECTAL | Status: DC | PRN

## 2024-05-22 MED ORDER — DOXERCALCIFEROL 4 MCG/2ML IV SOLN
6.0000 ug | INTRAVENOUS | Status: DC
  Filled 2024-05-22 (×2): qty 4

## 2024-05-22 MED ORDER — ACETAMINOPHEN 325 MG PO TABS
650.0000 mg | ORAL_TABLET | Freq: Four times a day (QID) | ORAL | Status: DC | PRN
  Administered 2024-05-23: 650 mg via ORAL
  Filled 2024-05-22: qty 2

## 2024-05-22 MED ORDER — SODIUM CHLORIDE 0.9% FLUSH
3.0000 mL | Freq: Two times a day (BID) | INTRAVENOUS | Status: DC
  Administered 2024-05-22 – 2024-05-24 (×3): 3 mL via INTRAVENOUS

## 2024-05-22 MED ORDER — TRAMADOL HCL 50 MG PO TABS
50.0000 mg | ORAL_TABLET | Freq: Four times a day (QID) | ORAL | Status: DC | PRN
  Administered 2024-05-22 – 2024-05-25 (×6): 50 mg via ORAL
  Filled 2024-05-22 (×6): qty 1

## 2024-05-22 MED ORDER — AMLODIPINE BESYLATE 10 MG PO TABS
10.0000 mg | ORAL_TABLET | Freq: Every day | ORAL | Status: DC
  Administered 2024-05-22 – 2024-05-24 (×3): 10 mg via ORAL
  Filled 2024-05-22 (×3): qty 1

## 2024-05-22 MED ORDER — POLYETHYLENE GLYCOL 3350 17 G PO PACK: 17.0000 g | PACK | Freq: Every day | ORAL | Status: DC | PRN

## 2024-05-22 MED ORDER — CINACALCET HCL 30 MG PO TABS
90.0000 mg | ORAL_TABLET | ORAL | Status: DC
  Filled 2024-05-22 (×2): qty 3

## 2024-05-22 MED ORDER — HEPARIN SODIUM (PORCINE) 5000 UNIT/ML IJ SOLN
5000.0000 [IU] | Freq: Three times a day (TID) | INTRAMUSCULAR | Status: DC
  Administered 2024-05-22 – 2024-05-23 (×3): 5000 [IU] via SUBCUTANEOUS
  Filled 2024-05-22 (×3): qty 1

## 2024-05-22 NOTE — Consult Note (Signed)
 [facility]  Patient name: [name] MRN: [id number] DOB: [birth year] Sex: male   REQUESTING PROVIDER:   ER   REASON FOR CONSULT:    Left leg pain  HISTORY OF PRESENT ILLNESS:   [name] is a 73 y.o. male, who presented to the ER on 05/22/2024 with 3-week history of cramping in his left leg as well as pain over his left Achilles.  He currently cannot put weight on his leg.  Patient is on dialysis on Monday Wednesday Friday.  He is a diabetic.  He is medically managed for hypertension.  He takes a statin for hypercholesterolemia.  He is a non-smoker.  He recently had a procedure for urethral stricture  PAST MEDICAL HISTORY    Past Medical History:  Diagnosis Date   Anemia    d/t ESRD   Arthritis    Benign colon polyp 08/11/2010   CHF (congestive heart failure) (HCC)    Clot    STATED TOLD IN APRIL CLOT TO LEFT SHOULDER. 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   HTN (hypertension)    Hyperlipidemia    Neuromuscular disorder (HCC)    neuropathy down LLE   Pneumonia    Seizures (HCC)      FAMILY HISTORY   Family History  Problem Relation Age of Onset   Diabetes Father    Colon cancer Neg Hx    Esophageal cancer Neg Hx    Stomach cancer Neg Hx    Rectal cancer Neg Hx     SOCIAL HISTORY:   Social History   Socioeconomic History   Marital status: Married    Spouse name: cordellia   Number of children: 5   Years of education: Not on file   Highest education level: Doctorate  Occupational History   Occupation: PhD Garment/textile technologist: [employer]  Tobacco Use   Smoking status: Never    Passive exposure: Never   Smokeless tobacco: Never  Vaping Use   Vaping status: Never Used  Substance  and Sexual Activity   Alcohol use: Yes    Alcohol/week: 1.0 standard drink of alcohol    Types: 1 Shots of liquor per week    Comment: occasionally   Drug use: No   Sexual activity: Yes    Birth control/protection: Condom  Other Topics Concern   Not on file  Social History Narrative   Regular Exercise -  YES         Social Drivers of Health   Financial Resource Strain: Low Risk  (03/30/2024)   Overall Financial Resource Strain (CARDIA)    Difficulty of Paying Living Expenses: Not hard at all  Food Insecurity: No Food Insecurity (03/30/2024)   Hunger Vital Sign    Worried About Running Out of Food in the Last Year: Never true    Ran Out of Food in the Last Year: Never true  Transportation Needs: No Transportation Needs (03/30/2024)   PRAPARE - Administrator, Civil Service (Medical): No    Lack of Transportation (Non-Medical): No  Physical Activity: Insufficiently Active (03/30/2024)   Exercise Vital Sign    Days of Exercise per Week: 3 days    Minutes of Exercise per  Session: 30 min  Stress: No Stress Concern Present (03/30/2024)   [employer] of Occupational Health - Occupational Stress Questionnaire    Feeling of Stress: Only a little  Social Connections: Socially Integrated (03/30/2024)   Social Connection and Isolation Panel    Frequency of Communication with Friends and Family: More than three times a week    Frequency of Social Gatherings with Friends and Family: Three times a week    Attends Religious Services: More than 4 times per year    Active Member of Clubs or Organizations: Yes    Attends Banker Meetings: More than 4 times per year    Marital Status: Married  Catering manager Violence: Not At Risk (03/30/2024)   Humiliation, Afraid, Rape, and Kick questionnaire    Fear of Current or Ex-Partner: No    Emotionally Abused: No    Physically Abused: No    Sexually Abused: No    ALLERGIES:    No Known Allergies  CURRENT  MEDICATIONS:    No current facility-administered medications for this encounter.   Current Outpatient Medications  Medication Sig Dispense Refill   amLODipine  (NORVASC ) 10 MG tablet Take 10 mg by mouth at bedtime.     carvedilol  (COREG ) 25 MG tablet Take 25 mg by mouth 2 (two) times daily with a meal.     cinacalcet  (SENSIPAR ) 30 MG tablet Take 2 tablets (60 mg total) by mouth every Monday, Wednesday, and Friday at 6 PM. 60 tablet 0   polyethylene glycol (MIRALAX  / GLYCOLAX ) 17 g packet Take 17 g by mouth daily. 14 each 0   sevelamer  carbonate (RENVELA ) 800 MG tablet Take 4 tablets by mouth 3 (three) times daily with meals.     sodium polystyrene (KAYEXALATE) powder Take 30 g by mouth 2 (two) times a week. Thursdays Sundays     traMADol  (ULTRAM ) 50 MG tablet Take 1 tablet (50 mg total) by mouth every 6 (six) hours as needed. 30 tablet 0   traMADol  (ULTRAM ) 50 MG tablet Take 1 tablet (50 mg total) by mouth every 6 (six) hours as needed. 10 tablet 0    REVIEW OF SYSTEMS:   [X]  denotes positive finding, [ ]  denotes negative finding Cardiac  Comments:  Chest pain or chest pressure:    Shortness of breath upon exertion:    Short of breath when lying flat:    Irregular heart rhythm:        Vascular    Pain in calf, thigh, or hip brought on by ambulation:    Pain in feet at night that wakes you up from your sleep:     Blood clot in your veins:    Leg swelling:         Pulmonary    Oxygen at home:    Productive cough:     Wheezing:         Neurologic    Sudden weakness in arms or legs:     Sudden numbness in arms or legs:     Sudden onset of difficulty speaking or slurred speech:    Temporary loss of vision in one eye:     Problems with dizziness:         Gastrointestinal    Blood in stool:      Vomited blood:         Genitourinary    Burning when urinating:     Blood in urine:        Psychiatric  Major depression:         Hematologic    Bleeding problems:     Problems with blood clotting too easily:        Skin    Rashes or ulcers:        Constitutional    Fever or chills:     PHYSICAL EXAM:   Vitals:   05/22/24 0844 05/22/24 0943 05/22/24 1000 05/22/24 1150  BP:   (!) 174/79 (!) 142/82  Pulse:   93 96  Resp:   18 17  Temp:    98.1 F (36.7 C)  TempSrc:    Oral  SpO2:  98% 99% 99%  Weight: 70.3 kg     Height: 5' 10 (1.778 m)       GENERAL: The patient is a well-nourished male, in no acute distress. The vital signs are documented above. CARDIAC: There is a regular rate and rhythm.  VASCULAR: Palpable femoral pulses bilaterally.  Faint left posterior tibial signal PULMONARY: Nonlabored respirations ABDOMEN: Soft and non-tender   MUSCULOSKELETAL: There are no major deformities or cyanosis. NEUROLOGIC: No focal weakness or paresthesias are detected. SKIN: There are no ulcers or rashes noted. PSYCHIATRIC: The patient has a normal affect.  STUDIES:   I have reviewed the following: ABI Findings:  +---------+------------------+-----+-----------+-------------------+  Right   Rt Pressure (mmHg)IndexWaveform   Comment              +---------+------------------+-----+-----------+-------------------+  Brachial                                   Restricted/dialysis  +---------+------------------+-----+-----------+-------------------+  PTA     254               1.46 multiphasic                     +---------+------------------+-----+-----------+-------------------+  DP      254               1.46 multiphasic                     +---------+------------------+-----+-----------+-------------------+  Great Toe61                0.35 Normal                          +---------+------------------+-----+-----------+-------------------+   +---------+------------------+-----+-------------------+-------+  Left    Lt Pressure (mmHg)IndexWaveform           Comment   +---------+------------------+-----+-------------------+-------+  Brachial 174                    triphasic                   +---------+------------------+-----+-------------------+-------+  PTA     254               1.46 dampened monophasic         +---------+------------------+-----+-------------------+-------+  DP      254               1.46 dampened monophasic         +---------+------------------+-----+-------------------+-------+  Great Toe0                 0.00 Absent                      +---------+------------------+-----+-------------------+-------+  ASSESSMENT and PLAN   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  I have recommended proceeding with angiography via right femoral approach and intervening on the left leg as indicated.  We discussed the risks and benefits as well as the details of the procedure and he wishes to proceed.  His wife is with him at the bedside.  He needs to be n.p.o. after midnight and we will plan on doing this tomorrow.   Malvina Serene CLORE, MD, FACS [facility] [phone] Pager [phone]

## 2024-05-22 NOTE — ED Provider Notes (Signed)
 [facility] AT [facility] Provider Note   CSN: [id number] Arrival date & time: 05/22/24  9166     Patient presents with: Leg Pain   [name] is a 73 y.o. male.    Leg Pain  Patient is a 73 year old male with past medical history significant for hypertension hyperlipidemia gout, DVT not on any anticoagulation CKD on HD last dialysis was Friday full session.   Patient presents emergency room today with approximately 3 weeks of cramping left leg pain also complains of ulcer on his left ankle over his Achilles tendon was seen in PCP office and had a ABI ordered outpatient but his symptoms have continued.  He was seen 9/10 in podiatry office and had incision and drainage of paronychia and the left Achilles tendon wound was evaluated and I do not see that he was started on any antibiotics at that time.     Prior to Admission medications   Medication Sig Start Date End Date Taking? Authorizing Provider  amLODipine  (NORVASC ) 10 MG tablet Take 10 mg by mouth at bedtime. 06/30/23   [provider]  carvedilol  (COREG ) 25 MG tablet Take 25 mg by mouth 2 (two) times daily with a meal.    [provider]  cinacalcet  (SENSIPAR ) 30 MG tablet Take 2 tablets (60 mg total) by mouth every Monday, Wednesday, and Friday at 6 PM. 11/23/23   Rosario Leatrice FERNS, MD  polyethylene glycol (MIRALAX  / GLYCOLAX ) 17 g packet Take 17 g by mouth daily. 11/24/23   Rosario Leatrice FERNS, MD  sevelamer  carbonate (RENVELA ) 800 MG tablet Take 4 tablets by mouth 3 (three) times daily with meals. 04/01/21   [provider]  sodium polystyrene (KAYEXALATE) powder Take 30 g by mouth 2 (two) times a week. Thursdays Sundays 12/16/23   [provider]  traMADol  (ULTRAM ) 50 MG tablet Take 1 tablet (50 mg total) by mouth every 6 (six) hours as needed. 04/06/24   Norleen Lynwood ORN, MD  traMADol  (ULTRAM ) 50 MG tablet Take 1 tablet (50 mg total) by mouth every 6 (six)  hours as needed. 04/12/24 04/12/25  Pace, Maryellen D, MD    Allergies: Patient has no known allergies.    Review of Systems  Updated Vital Signs BP (!) 142/82   Pulse 96   Temp 98.1 F (36.7 C) (Oral)   Resp 17   Ht 5' 10 (1.778 m)   Wt 70.3 kg   SpO2 99%   BMI 22.24 kg/m   Physical Exam Vitals and nursing note reviewed.  Constitutional:      General: He is not in acute distress. HENT:     Head: Normocephalic and atraumatic.     Nose: Nose normal.     Mouth/Throat:     Mouth: Mucous membranes are moist.  Eyes:     General: No scleral icterus. Cardiovascular:     Rate and Rhythm: Normal rate and regular rhythm.     Pulses: Normal pulses.     Heart sounds: Normal heart sounds.     Comments: Upper arm fistula with good thrill Pulmonary:     Effort: Pulmonary effort is normal. No respiratory distress.     Breath sounds: No wheezing.  Abdominal:     Palpations: Abdomen is soft.     Tenderness: There is no abdominal tenderness. There is no guarding or rebound.  Musculoskeletal:     Cervical back: Normal range of motion.     Comments: Left lower extremity  with tenderness over the small shallow ulcer at the posterior Achilles tendon there is very tender to touch.  Left foot with difficult to palpate pulses.  Doppler laminar flow confirmed in posterior tibial artery and DP I was unable to find with Doppler.   Skin:    General: Skin is warm and dry.     Capillary Refill: Capillary refill takes less than 2 seconds.  Neurological:     Mental Status: He is alert. Mental status is at baseline.  Psychiatric:        Mood and Affect: Mood normal.        Behavior: Behavior normal.     (all labs ordered are listed, but only abnormal results are displayed) Labs Reviewed  COMPREHENSIVE METABOLIC PANEL WITH GFR - Abnormal; Notable for the following components:      Result Value   Chloride 91 (*)    Glucose, Bld 112 (*)    BUN 54 (*)    Creatinine, Ser 9.56 (*)    Albumin 3.2  (*)    GFR, Estimated 5 (*)    Anion gap 25 (*)    All other components within normal limits  CBC WITH DIFFERENTIAL/PLATELET - Abnormal; Notable for the following components:   RBC 3.86 (*)    Hemoglobin 11.3 (*)    HCT 35.9 (*)    All other components within normal limits  MAGNESIUM - Abnormal; Notable for the following components:   Magnesium 2.6 (*)    All other components within normal limits  PROTIME-INR  I-STAT CG4 LACTIC ACID, ED  I-STAT CG4 LACTIC ACID, ED    EKG: None  Radiology: VAS US  ABI WITH/WO TBI Result Date: 05/22/2024  LOWER EXTREMITY DOPPLER STUDY Patient Name:  [name]  Date of Exam:   05/22/2024 Medical Rec #: [id number]          Accession #:    [id number] Date of Birth: [birth year]          Patient Gender: M Patient Age:   41 years Exam Location:  [facility] Procedure:      VAS US  ABI WITH/WO TBI Referring Phys: HAMP Blakeley Scheier --------------------------------------------------------------------------------  Indications: Claudication, rest pain, and peripheral artery disease. High Risk Factors: Hypertension, hyperlipidemia, Diabetes, no history of                    smoking. Other Factors: ESRD on dialysis, failed kidney transplant,.  Limitations: Today's exam was limited due to involuntary patient movement and              patient unable to extend leg secondary to rest pain. Comparison Study: Prior ABI done 03/18/21 Performing Technologist: Rachel Pellet RVS  Examination Guidelines: A complete evaluation includes at minimum, Doppler waveform signals and systolic blood pressure reading at the level of bilateral brachial, anterior tibial, and posterior tibial arteries, when vessel segments are accessible. Bilateral testing is considered an integral part of a complete examination. Photoelectric Plethysmograph (PPG) waveforms and toe systolic pressure readings are included as required and additional duplex testing as needed. Limited examinations for reoccurring  indications may be performed as noted.  ABI Findings: +---------+------------------+-----+-----------+-------------------+ Right    Rt Pressure (mmHg)IndexWaveform   Comment             +---------+------------------+-----+-----------+-------------------+ Brachial                                   Restricted/dialysis +---------+------------------+-----+-----------+-------------------+  PTA      254               1.46 multiphasic                    +---------+------------------+-----+-----------+-------------------+ DP       254               1.46 multiphasic                    +---------+------------------+-----+-----------+-------------------+ Great Toe61                0.35 Normal                         +---------+------------------+-----+-----------+-------------------+ +---------+------------------+-----+-------------------+-------+ Left     Lt Pressure (mmHg)IndexWaveform           Comment +---------+------------------+-----+-------------------+-------+ Brachial 174                    triphasic                  +---------+------------------+-----+-------------------+-------+ PTA      254               1.46 dampened monophasic        +---------+------------------+-----+-------------------+-------+ DP       254               1.46 dampened monophasic        +---------+------------------+-----+-------------------+-------+ Great Toe0                 0.00 Absent                     +---------+------------------+-----+-------------------+-------+ +-------+---------------------+-----------+------------+------------+ ABI/TBIToday's ABI          Today's TBIPrevious ABIPrevious TBI +-------+---------------------+-----------+------------+------------+ Right  1.46/non compressible0.35       0.94        0.52         +-------+---------------------+-----------+------------+------------+ Left   1.46/non compressibleabsent     1.06        0.54          +-------+---------------------+-----------+------------+------------+  Arterial wall calcification precludes accurate ankle pressures and ABIs. Bilateral TBIs appear decreased compared to prior study on 03/18/21. Bilateral ABIs appear calcified compared to prior study on 03/18/21.  Summary: Right: Resting right ankle-brachial index indicates noncompressible right lower extremity arteries. The right toe-brachial index is abnormal.  Left: Resting left ankle-brachial index indicates noncompressible left lower extremity arteries, however, waveforms are severely dampened. The left toe-brachial index is abnormal.  *See table(s) above for measurements and observations.  Suggest Peripheral Vascular Consult.    Preliminary    VAS US  LOWER EXTREMITY VENOUS (DVT) (7a-7p) Result Date: 05/22/2024  Lower Venous DVT Study Patient Name:  [name]  Date of Exam:   05/22/2024 Medical Rec #: [id number]          Accession #:    [id number] Date of Birth: 01-11-1951          Patient Gender: M Patient Age:   73 years Exam Location:  [facility] Procedure:      VAS US  LOWER EXTREMITY VENOUS (DVT) Referring Phys: HAMP Lianette Broussard --------------------------------------------------------------------------------  Indications: Pain. Patient had recent procedure for ingrown left great toe with Podiatry 04/20/24. Now, with ischemic type pain in left lower leg and foot.  Limitations: Shadowing from arterial calcification. Comparison Study: No prior left LEV on file Performing Technologist: Alberta Lis RVS  Examination Guidelines: A complete evaluation includes B-mode imaging, spectral Doppler, color Doppler, and power Doppler as needed of all accessible portions of each vessel. Bilateral testing is considered an integral part of a complete examination. Limited examinations for reoccurring indications may be performed as noted. The reflux portion of the exam is performed with the patient in reverse Trendelenburg.   +-----+---------------+---------+-----------+----------+--------------+ RIGHTCompressibilityPhasicitySpontaneityPropertiesThrombus Aging +-----+---------------+---------+-----------+----------+--------------+ CFV  Full           Yes      Yes                                 +-----+---------------+---------+-----------+----------+--------------+ SFJ  Full                                                        +-----+---------------+---------+-----------+----------+--------------+   +---------+---------------+---------+-----------+----------+--------------+ LEFT     CompressibilityPhasicitySpontaneityPropertiesThrombus Aging +---------+---------------+---------+-----------+----------+--------------+ CFV      Full           Yes      Yes                                 +---------+---------------+---------+-----------+----------+--------------+ SFJ      Full                                                        +---------+---------------+---------+-----------+----------+--------------+ FV Prox  Full                                                        +---------+---------------+---------+-----------+----------+--------------+ FV Mid   Full                                                        +---------+---------------+---------+-----------+----------+--------------+ FV DistalFull                                                        +---------+---------------+---------+-----------+----------+--------------+ PFV      Full                                                        +---------+---------------+---------+-----------+----------+--------------+ POP      Full           Yes      Yes                                 +---------+---------------+---------+-----------+----------+--------------+  PTV      Full                                                        +---------+---------------+---------+-----------+----------+--------------+  PERO     Full                                                        +---------+---------------+---------+-----------+----------+--------------+ Gastroc  Full                                                        +---------+---------------+---------+-----------+----------+--------------+    Summary: RIGHT: - No evidence of common femoral vein obstruction.  - Incidentally, heavy calcification noted in the CFA and FA  LEFT: - There is no evidence of deep vein thrombosis in the lower extremity.  - No cystic structure found in the popliteal fossa. - Incidentally, heavy calcification noted throughout the left lower extremity arterial system  *See table(s) above for measurements and observations.    Preliminary      .Critical Care  Performed by: Neldon Hamp RAMAN, PA Authorized by: Neldon Hamp RAMAN, PA   Critical care provider statement:    Critical care time (minutes):  35   Critical care time was exclusive of:  Separately billable procedures and treating other patients and teaching time   Critical care was necessary to treat or prevent imminent or life-threatening deterioration of the following conditions:  Circulatory failure   Critical care was time spent personally by me on the following activities:  Development of treatment plan with patient or surrogate, review of old charts, re-evaluation of patient's condition, pulse oximetry, ordering and review of radiographic studies, ordering and review of laboratory studies, ordering and performing treatments and interventions, obtaining history from patient or surrogate, examination of patient and evaluation of patient's response to treatment   Care discussed with: admitting provider      Medications Ordered in the ED  oxyCODONE -acetaminophen  (PERCOCET/ROXICET) 5-325 MG per tablet 1 tablet (1 tablet Oral Given 05/22/24 1144)                                    Medical Decision Making Amount and/or Complexity of Data Reviewed Labs:  ordered.  Risk Prescription drug management. Decision regarding hospitalization.   This patient presents to the ED for concern of leg pain, this involves a number of treatment options, and is a complaint that carries with it a high to moderate risk of complications and morbidity. A differential diagnosis was considered for the patient's symptoms which is discussed below:   Patient with leg pain concerning for arterial disease versus DVT.   Co morbidities: Discussed in HPI   Brief History:  Patient is a 73 year old male with past medical history significant for hypertension hyperlipidemia gout, DVT not on any anticoagulation CKD on HD last dialysis was Friday full session.   Patient presents emergency room today with approximately 3 weeks  of cramping left leg pain also complains of ulcer on his left ankle over his Achilles tendon was seen in PCP office and had a ABI ordered outpatient but his symptoms have continued.  He was seen 9/10 in podiatry office and had incision and drainage of paronychia and the left Achilles tendon wound was evaluated and I do not see that he was started on any antibiotics at that time.    EMR reviewed including pt PMHx, past surgical history and past visits to ER.   See HPI for more details   Lab Tests:   I ordered and independently interpreted labs. Labs notable for CBC unremarkable CMP consistent with patient's known CKD  Imaging Studies:  Abnormal findings. I personally reviewed all imaging studies. Imaging notable for ABI with TBI shows severely dampened waveforms in the left lower extremity critical ischemia present   Cardiac Monitoring:  The patient was maintained on a cardiac monitor.  I personally viewed and interpreted the cardiac monitored which showed an underlying rhythm of: NSR NA   Medicines ordered:  I ordered medication including heparin , Percocet for pain Reevaluation of the patient after these medicines showed that the  patient improved I have reviewed the patients home medicines and have made adjustments as needed   Critical Interventions:   heparinization and consultation with vascular surgery   Consults/Attending Physician   I requested consultation with Dr. Serene of vascular surgery,  and discussed lab and imaging findings as well as pertinent plan - they recommend: Heparinization and admission for critical limb ischemia   Reevaluation:  After the interventions noted above I re-evaluated patient and found that they have :improved   Social Determinants of Health:      Problem List / ED Course:  Patient with critical limb ischemia of left lower extremity does have a developing wound over the left Achilles tendon has sensation and movement of toes somewhat delayed cap refill nonpalpable DP pulse and nondopplerable.  Dopplerable posterior tibial.  Vascular surgery will evaluate patient and make recommendations, Marsa Scurry of the hospitalist service will admit.  Patient heparinized and admitted.   Dispostion:  After consideration of the diagnostic results and the patients response to treatment, I feel that the patent would benefit from admission      Final diagnoses:  Arterial disease    ED Discharge Orders     None          Neldon Hamp RAMAN, [place] 05/22/24 1354    Tegeler, Lonni PARAS, MD 05/22/24 1539

## 2024-05-22 NOTE — H&P (View-Only) (Signed)
 [facility]  Patient name: [name] MRN: [id number] DOB: [birth year] Sex: male   REQUESTING PROVIDER:   ER   REASON FOR CONSULT:    Left leg pain  HISTORY OF PRESENT ILLNESS:   [name] is a 73 y.o. male, who presented to the ER on 05/22/2024 with 3-week history of cramping in his left leg as well as pain over his left Achilles.  He currently cannot put weight on his leg.  Patient is on dialysis on Monday Wednesday Friday.  He is a diabetic.  He is medically managed for hypertension.  He takes a statin for hypercholesterolemia.  He is a non-smoker.  He recently had a procedure for urethral stricture  PAST MEDICAL HISTORY    Past Medical History:  Diagnosis Date   Anemia    d/t ESRD   Arthritis    Benign colon polyp 08/11/2010   CHF (congestive heart failure) (HCC)    Clot    STATED TOLD IN APRIL CLOT TO LEFT SHOULDER. 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   HTN (hypertension)    Hyperlipidemia    Neuromuscular disorder (HCC)    neuropathy down LLE   Pneumonia    Seizures (HCC)      FAMILY HISTORY   Family History  Problem Relation Age of Onset   Diabetes Father    Colon cancer Neg Hx    Esophageal cancer Neg Hx    Stomach cancer Neg Hx    Rectal cancer Neg Hx     SOCIAL HISTORY:   Social History   Socioeconomic History   Marital status: Married    Spouse name: cordellia   Number of children: 5   Years of education: Not on file   Highest education level: Doctorate  Occupational History   Occupation: PhD Garment/textile technologist: [employer]  Tobacco Use   Smoking status: Never    Passive exposure: Never   Smokeless tobacco: Never  Vaping Use   Vaping status: Never Used  Substance  and Sexual Activity   Alcohol use: Yes    Alcohol/week: 1.0 standard drink of alcohol    Types: 1 Shots of liquor per week    Comment: occasionally   Drug use: No   Sexual activity: Yes    Birth control/protection: Condom  Other Topics Concern   Not on file  Social History Narrative   Regular Exercise -  YES         Social Drivers of Health   Financial Resource Strain: Low Risk  (03/30/2024)   Overall Financial Resource Strain (CARDIA)    Difficulty of Paying Living Expenses: Not hard at all  Food Insecurity: No Food Insecurity (03/30/2024)   Hunger Vital Sign    Worried About Running Out of Food in the Last Year: Never true    Ran Out of Food in the Last Year: Never true  Transportation Needs: No Transportation Needs (03/30/2024)   PRAPARE - Administrator, Civil Service (Medical): No    Lack of Transportation (Non-Medical): No  Physical Activity: Insufficiently Active (03/30/2024)   Exercise Vital Sign    Days of Exercise per Week: 3 days    Minutes of Exercise per  Session: 30 min  Stress: No Stress Concern Present (03/30/2024)   [employer] of Occupational Health - Occupational Stress Questionnaire    Feeling of Stress: Only a little  Social Connections: Socially Integrated (03/30/2024)   Social Connection and Isolation Panel    Frequency of Communication with Friends and Family: More than three times a week    Frequency of Social Gatherings with Friends and Family: Three times a week    Attends Religious Services: More than 4 times per year    Active Member of Clubs or Organizations: Yes    Attends Banker Meetings: More than 4 times per year    Marital Status: Married  Catering manager Violence: Not At Risk (03/30/2024)   Humiliation, Afraid, Rape, and Kick questionnaire    Fear of Current or Ex-Partner: No    Emotionally Abused: No    Physically Abused: No    Sexually Abused: No    ALLERGIES:    No Known Allergies  CURRENT  MEDICATIONS:    No current facility-administered medications for this encounter.   Current Outpatient Medications  Medication Sig Dispense Refill   amLODipine  (NORVASC ) 10 MG tablet Take 10 mg by mouth at bedtime.     carvedilol  (COREG ) 25 MG tablet Take 25 mg by mouth 2 (two) times daily with a meal.     cinacalcet  (SENSIPAR ) 30 MG tablet Take 2 tablets (60 mg total) by mouth every Monday, Wednesday, and Friday at 6 PM. 60 tablet 0   polyethylene glycol (MIRALAX  / GLYCOLAX ) 17 g packet Take 17 g by mouth daily. 14 each 0   sevelamer  carbonate (RENVELA ) 800 MG tablet Take 4 tablets by mouth 3 (three) times daily with meals.     sodium polystyrene (KAYEXALATE) powder Take 30 g by mouth 2 (two) times a week. Thursdays Sundays     traMADol  (ULTRAM ) 50 MG tablet Take 1 tablet (50 mg total) by mouth every 6 (six) hours as needed. 30 tablet 0   traMADol  (ULTRAM ) 50 MG tablet Take 1 tablet (50 mg total) by mouth every 6 (six) hours as needed. 10 tablet 0    REVIEW OF SYSTEMS:   [X]  denotes positive finding, [ ]  denotes negative finding Cardiac  Comments:  Chest pain or chest pressure:    Shortness of breath upon exertion:    Short of breath when lying flat:    Irregular heart rhythm:        Vascular    Pain in calf, thigh, or hip brought on by ambulation:    Pain in feet at night that wakes you up from your sleep:     Blood clot in your veins:    Leg swelling:         Pulmonary    Oxygen at home:    Productive cough:     Wheezing:         Neurologic    Sudden weakness in arms or legs:     Sudden numbness in arms or legs:     Sudden onset of difficulty speaking or slurred speech:    Temporary loss of vision in one eye:     Problems with dizziness:         Gastrointestinal    Blood in stool:      Vomited blood:         Genitourinary    Burning when urinating:     Blood in urine:        Psychiatric  Major depression:         Hematologic    Bleeding problems:     Problems with blood clotting too easily:        Skin    Rashes or ulcers:        Constitutional    Fever or chills:     PHYSICAL EXAM:   Vitals:   05/22/24 0844 05/22/24 0943 05/22/24 1000 05/22/24 1150  BP:   (!) 174/79 (!) 142/82  Pulse:   93 96  Resp:   18 17  Temp:    98.1 F (36.7 C)  TempSrc:    Oral  SpO2:  98% 99% 99%  Weight: 70.3 kg     Height: 5' 10 (1.778 m)       GENERAL: The patient is a well-nourished male, in no acute distress. The vital signs are documented above. CARDIAC: There is a regular rate and rhythm.  VASCULAR: Palpable femoral pulses bilaterally.  Faint left posterior tibial signal PULMONARY: Nonlabored respirations ABDOMEN: Soft and non-tender   MUSCULOSKELETAL: There are no major deformities or cyanosis. NEUROLOGIC: No focal weakness or paresthesias are detected. SKIN: There are no ulcers or rashes noted. PSYCHIATRIC: The patient has a normal affect.  STUDIES:   I have reviewed the following: ABI Findings:  +---------+------------------+-----+-----------+-------------------+  Right   Rt Pressure (mmHg)IndexWaveform   Comment              +---------+------------------+-----+-----------+-------------------+  Brachial                                   Restricted/dialysis  +---------+------------------+-----+-----------+-------------------+  PTA     254               1.46 multiphasic                     +---------+------------------+-----+-----------+-------------------+  DP      254               1.46 multiphasic                     +---------+------------------+-----+-----------+-------------------+  Great Toe61                0.35 Normal                          +---------+------------------+-----+-----------+-------------------+   +---------+------------------+-----+-------------------+-------+  Left    Lt Pressure (mmHg)IndexWaveform           Comment   +---------+------------------+-----+-------------------+-------+  Brachial 174                    triphasic                   +---------+------------------+-----+-------------------+-------+  PTA     254               1.46 dampened monophasic         +---------+------------------+-----+-------------------+-------+  DP      254               1.46 dampened monophasic         +---------+------------------+-----+-------------------+-------+  Great Toe0                 0.00 Absent                      +---------+------------------+-----+-------------------+-------+  ASSESSMENT and PLAN   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  I have recommended proceeding with angiography via right femoral approach and intervening on the left leg as indicated.  We discussed the risks and benefits as well as the details of the procedure and he wishes to proceed.  His wife is with him at the bedside.  He needs to be n.p.o. after midnight and we will plan on doing this tomorrow.   Malvina Serene CLORE, MD, FACS [facility] [phone] Pager [phone]

## 2024-05-22 NOTE — Consult Note (Signed)
 Reason for Consult: To manage dialysis and dialysis related needs  Referring Physician: Dr Melvin  Emeric E Brzoska is an 73 y.o. male.   HPI: Pt is a 69M with a PMH sig for HTN, HLD, ESRD on HD, DM II, CHF, and PAD who is seen in consultation at the request of Dr Seena for management of ESRD and provision of HD.    Pt presented to ED today after 2 month progressive history of L leg pain/ cramping.  Occurring after dialysis and at rest, not so much on dialysis.  Progressed to the point where he can't bear weight.  Developed an area of sloughed skin on great toe, something that looks like a scratch on back of heel.    Was seen in ED by VVS, dopplers indicative of severe L PAD.  For angio tomorrow.  In this setting we are asked to see.  Completed last HD session Friday.  Using AVG, no recent issues.    Dialyzes at [facility] MWF EDW 68.1 kg 4 hrsF180 BFR 400 mL/ min AVF Profile 2  15 g needles Mircera 50 q 2 weeks Hectorol  6 q rx Sensipar  90 three times per week Sevelamer  4 tabs TID with meals Kayexelate 3x/ weekly   Past Medical History:  Diagnosis Date   Anemia    d/t ESRD   Arthritis    Benign colon polyp 08/11/2010   CHF (congestive heart failure) (HCC)    Clot    STATED TOLD IN APRIL CLOT TO LEFT SHOULDER. 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   HTN (hypertension)    Hyperlipidemia    Need for prophylactic vaccination and inoculation against varicella 12/26/2021   Need for prophylactic vaccination with combined diphtheria-tetanus-pertussis (DTP) vaccine 12/26/2021   Neuromuscular disorder (HCC)    neuropathy down LLE   Pneumonia    Seizures (HCC)     Past Surgical History:  Procedure Laterality Date   AV FISTULA PLACEMENT  01/19/2012   Procedure: ARTERIOVENOUS (AV) FISTULA CREATION;   Surgeon: Redell LITTIE Door, MD;  Location: [facility] OR;  Service: Vascular;  Laterality: Right;  Right Brachio-cephalic arteriovenous fistula   AV FISTULA PLACEMENT Right 07/18/2021   Procedure: INSERTION OF RIGHT UPPER EXTREMITY ARTERIOVENOUS (AV) GORE-TEX GRAFT;  Surgeon: Magda [name] SAILOR, MD;  Location: [facility] OR;  Service: Vascular;  Laterality: Right;  PERIPHERAL NERVE BLOCK   AV FISTULA PLACEMENT, RADIOCEPHALIC  12/31/10   Right arm   INSERTION OF DIALYSIS CATHETER  01/19/2012   Procedure: INSERTION OF DIALYSIS CATHETER;  Surgeon: Redell LITTIE Door, MD;  Location: [facility] OR;  Service: Vascular;  Laterality: N/A;  right internal jugular vein   TRANSESOPHAGEAL ECHOCARDIOGRAM (CATH LAB) N/A 09/23/2023   Procedure: TRANSESOPHAGEAL ECHOCARDIOGRAM;  Surgeon: Santo Stanly LABOR, MD;  Location: [facility] INVASIVE CV LAB;  Service: Cardiovascular;  Laterality: N/A;   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   UPPER EXTREMITY VENOGRAPHY Right 06/11/2021   Procedure: UPPER EXTREMITY VENOGRAPHY;  Surgeon: Serene Gaile ORN, MD;  Location: [facility] INVASIVE CV LAB;  Service: Cardiovascular;  Laterality: Right;   VENOGRAM Left 05/30/2021   Procedure: DIAGNOSTIC CENTRAL VENOGRAM;  Surgeon: Lanis Fonda BRAVO, MD;  Location: [facility] OR;  Service: Vascular;  Laterality: Left;    Family History  Problem Relation Age of Onset  Diabetes Father    Colon cancer Neg Hx    Esophageal cancer Neg Hx    Stomach cancer Neg Hx    Rectal cancer Neg Hx     Social History:  reports that he has never smoked. He has never been exposed to tobacco smoke. He has never used smokeless tobacco. He reports current alcohol use of about 1.0 standard drink of alcohol per week. He reports that he does not use drugs.  Allergies: No Known Allergies  Medications: Scheduled:  amLODipine   10 mg Oral QHS   [START ON 05/23/2024] atorvastatin  10 mg Oral Daily   carvedilol   25 mg Oral BID WC   [START ON 05/23/2024] Chlorhexidine  Gluconate Cloth  6 each Topical Q0600   heparin    5,000 Units Subcutaneous Q8H   insulin  aspart  0-6 Units Subcutaneous TID WC   sodium chloride  flush  3 mL Intravenous Q12H     Results for orders placed or performed during the hospital encounter of 05/22/24 (from the past 48 hours)  Comprehensive metabolic panel     Status: Abnormal   Collection Time: 05/22/24  8:45 AM  Result Value Ref Range   Sodium 142 135 - 145 mmol/L   Potassium 4.8 3.5 - 5.1 mmol/L   Chloride 91 (L) 98 - 111 mmol/L   CO2 26 22 - 32 mmol/L   Glucose, Bld 112 (H) 70 - 99 mg/dL    Comment: Glucose reference range applies only to samples taken after fasting for at least 8 hours.   BUN 54 (H) 8 - 23 mg/dL   Creatinine, Ser 0.43 (H) 0.61 - 1.24 mg/dL   Calcium  8.9 8.9 - 10.3 mg/dL   Total Protein 7.8 6.5 - 8.1 g/dL   Albumin 3.2 (L) 3.5 - 5.0 g/dL   AST 20 15 - 41 U/L   ALT 12 0 - 44 U/L   Alkaline Phosphatase 111 38 - 126 U/L   Total Bilirubin 0.8 0.0 - 1.2 mg/dL   GFR, Estimated 5 (L) >60 mL/min    Comment: (NOTE) Calculated using the CKD-EPI Creatinine Equation (2021)    Anion gap 25 (H) 5 - 15    Comment: ELECTROLYTES REPEATED TO VERIFY Performed at [facility], [address] [place], [place] [address]   CBC with Differential     Status: Abnormal   Collection Time: 05/22/24  8:45 AM  Result Value Ref Range   WBC 6.6 4.0 - 10.5 K/uL   RBC 3.86 (L) 4.22 - 5.81 MIL/uL   Hemoglobin 11.3 (L) 13.0 - 17.0 g/dL   HCT 64.0 (L) 60.9 - 47.9 %   MCV 93.0 80.0 - 100.0 fL   MCH 29.3 26.0 - 34.0 pg   MCHC 31.5 30.0 - 36.0 g/dL   RDW 85.7 88.4 - 84.4 %   Platelets 179 150 - 400 K/uL   nRBC 0.0 0.0 - 0.2 %   Neutrophils Relative % 66 %   Neutro Abs 4.4 1.7 - 7.7 K/uL   Lymphocytes Relative 22 %   Lymphs Abs 1.5 0.7 - 4.0 K/uL   Monocytes Relative 10 %   Monocytes Absolute 0.7 0.1 - 1.0 K/uL   Eosinophils Relative 1 %   Eosinophils Absolute 0.1 0.0 - 0.5 K/uL   Basophils Relative 1 %   Basophils Absolute 0.0 0.0 - 0.1 K/uL   Immature Granulocytes  0 %   Abs Immature Granulocytes 0.02 0.00 - 0.07 K/uL    Comment: Performed at [facility]  Hospital Lab, [address] [place], [place] [address]  Protime-INR     Status: None   Collection Time: 05/22/24  8:45 AM  Result Value Ref Range   Prothrombin Time 14.9 11.4 - 15.2 seconds   INR 1.1 0.8 - 1.2    Comment: (NOTE) INR goal varies based on device and disease states. Performed at [facility], [address] [place], [place] [address]   Magnesium     Status: Abnormal   Collection Time: 05/22/24  8:45 AM  Result Value Ref Range   Magnesium 2.6 (H) 1.7 - 2.4 mg/dL    Comment: Performed at [facility], [address], [place], [place] [address]  I-Stat Lactic Acid, ED     Status: None   Collection Time: 05/22/24  9:01 AM  Result Value Ref Range   Lactic Acid, Venous 1.2 0.5 - 1.9 mmol/L  I-Stat Lactic Acid, ED     Status: None   Collection Time: 05/22/24 11:59 AM  Result Value Ref Range   Lactic Acid, Venous 1.7 0.5 - 1.9 mmol/L    VAS US  ABI WITH/WO TBI Result Date: 05/22/2024  LOWER EXTREMITY DOPPLER STUDY Patient Name:  KYRON SCHLITT  Date of Exam:   05/22/2024 Medical Rec #: [id number]          Accession #:    [id number] Date of Birth: 1951/03/01          Patient Gender: M Patient Age:   24 years Exam Location:  [facility] Procedure:      VAS US  ABI WITH/WO TBI Referring Phys: HAMP FONDAW --------------------------------------------------------------------------------  Indications: Claudication, rest pain, and peripheral artery disease. High Risk Factors: Hypertension, hyperlipidemia, Diabetes, no history of                    smoking. Other Factors: ESRD on dialysis, failed kidney transplant,.  Limitations: Today's exam was limited due to involuntary patient movement and              patient unable to extend leg secondary to rest pain. Comparison Study: Prior ABI done 03/18/21 Performing Technologist: Rachel Pellet RVS  Examination Guidelines: A complete  evaluation includes at minimum, Doppler waveform signals and systolic blood pressure reading at the level of bilateral brachial, anterior tibial, and posterior tibial arteries, when vessel segments are accessible. Bilateral testing is considered an integral part of a complete examination. Photoelectric Plethysmograph (PPG) waveforms and toe systolic pressure readings are included as required and additional duplex testing as needed. Limited examinations for reoccurring indications may be performed as noted.  ABI Findings: +---------+------------------+-----+-----------+-------------------+ Right    Rt Pressure (mmHg)IndexWaveform   Comment             +---------+------------------+-----+-----------+-------------------+ Brachial                                   Restricted/dialysis +---------+------------------+-----+-----------+-------------------+ PTA      254               1.46 multiphasic                    +---------+------------------+-----+-----------+-------------------+ DP       254               1.46 multiphasic                    +---------+------------------+-----+-----------+-------------------+ Burnetta Roston  0.35 Normal                         +---------+------------------+-----+-----------+-------------------+ +---------+------------------+-----+-------------------+-------+ Left     Lt Pressure (mmHg)IndexWaveform           Comment +---------+------------------+-----+-------------------+-------+ Brachial 174                    triphasic                  +---------+------------------+-----+-------------------+-------+ PTA      254               1.46 dampened monophasic        +---------+------------------+-----+-------------------+-------+ DP       254               1.46 dampened monophasic        +---------+------------------+-----+-------------------+-------+ Great Toe0                 0.00 Absent                      +---------+------------------+-----+-------------------+-------+ +-------+---------------------+-----------+------------+------------+ ABI/TBIToday's ABI          Today's TBIPrevious ABIPrevious TBI +-------+---------------------+-----------+------------+------------+ Right  1.46/non compressible0.35       0.94        0.52         +-------+---------------------+-----------+------------+------------+ Left   1.46/non compressibleabsent     1.06        0.54         +-------+---------------------+-----------+------------+------------+  Arterial wall calcification precludes accurate ankle pressures and ABIs. Bilateral TBIs appear decreased compared to prior study on 03/18/21. Bilateral ABIs appear calcified compared to prior study on 03/18/21.  Summary: Right: Resting right ankle-brachial index indicates noncompressible right lower extremity arteries. The right toe-brachial index is abnormal.  Left: Resting left ankle-brachial index indicates noncompressible left lower extremity arteries, however, waveforms are severely dampened. The left toe-brachial index is abnormal.  *See table(s) above for measurements and observations.  Suggest Peripheral Vascular Consult.    Preliminary    VAS US  LOWER EXTREMITY VENOUS (DVT) (7a-7p) Result Date: 05/22/2024  Lower Venous DVT Study Patient Name:  ERSEL ENSLIN  Date of Exam:   05/22/2024 Medical Rec #: [id number]          Accession #:    [id number] Date of Birth: [birth year]          Patient Gender: M Patient Age:   67 years Exam Location:  [facility] Procedure:      VAS US  LOWER EXTREMITY VENOUS (DVT) Referring Phys: HAMP FONDAW --------------------------------------------------------------------------------  Indications: Pain. Patient had recent procedure for ingrown left great toe with Podiatry 04/20/24. Now, with ischemic type pain in left lower leg and foot.  Limitations: Shadowing from arterial calcification. Comparison Study: No prior left LEV on  file Performing Technologist: Alberta Lis RVS  Examination Guidelines: A complete evaluation includes B-mode imaging, spectral Doppler, color Doppler, and power Doppler as needed of all accessible portions of each vessel. Bilateral testing is considered an integral part of a complete examination. Limited examinations for reoccurring indications may be performed as noted. The reflux portion of the exam is performed with the patient in reverse Trendelenburg.  +-----+---------------+---------+-----------+----------+--------------+ RIGHTCompressibilityPhasicitySpontaneityPropertiesThrombus Aging +-----+---------------+---------+-----------+----------+--------------+ CFV  Full           Yes      Yes                                 +-----+---------------+---------+-----------+----------+--------------+  SFJ  Full                                                        +-----+---------------+---------+-----------+----------+--------------+   +---------+---------------+---------+-----------+----------+--------------+ LEFT     CompressibilityPhasicitySpontaneityPropertiesThrombus Aging +---------+---------------+---------+-----------+----------+--------------+ CFV      Full           Yes      Yes                                 +---------+---------------+---------+-----------+----------+--------------+ SFJ      Full                                                        +---------+---------------+---------+-----------+----------+--------------+ FV Prox  Full                                                        +---------+---------------+---------+-----------+----------+--------------+ FV Mid   Full                                                        +---------+---------------+---------+-----------+----------+--------------+ FV DistalFull                                                         +---------+---------------+---------+-----------+----------+--------------+ PFV      Full                                                        +---------+---------------+---------+-----------+----------+--------------+ POP      Full           Yes      Yes                                 +---------+---------------+---------+-----------+----------+--------------+ PTV      Full                                                        +---------+---------------+---------+-----------+----------+--------------+ PERO     Full                                                        +---------+---------------+---------+-----------+----------+--------------+  Gastroc  Full                                                        +---------+---------------+---------+-----------+----------+--------------+    Summary: RIGHT: - No evidence of common femoral vein obstruction.  - Incidentally, heavy calcification noted in the CFA and FA  LEFT: - There is no evidence of deep vein thrombosis in the lower extremity.  - No cystic structure found in the popliteal fossa. - Incidentally, heavy calcification noted throughout the left lower extremity arterial system  *See table(s) above for measurements and observations.    Preliminary     ROS: all other systems reviewed and are negative except per HPI  Blood pressure (!) 147/87, pulse 84, temperature 98.2 F (36.8 C), temperature source Oral, resp. rate 15, height 5' 10 (1.778 m), weight 70.3 kg, SpO2 100%.   GEN nad HEENT eomi perrl NECK no jvd PULM clear CV RRR ABD soft EXT no LE edema, L leg with + achilles area ulceration, area of skin peeling L great toe.   NEURO aao x 3 ACCESS: R AVG + T/b  Assessment/Plan: 1 L critical limb ischemia: seen by VVS.  Plan for angio tomorrow (10/13) 2 ESRD: MWF at [facility]- will do HD on schedule tomorrow, working around angio schedule 3 Hypertension: reasonably well controlled on amlodipine  and  carvedilol , UF as tolerated 4. 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 Metabolic Bone Disease: sevelamer  as binder, sensipar  with HD as well as hectorol  6.  DM II: A1c pending, per primary 7.  HLD: to resume statin 8.  Dispo: admitted  GEARLINE NORRIS 05/22/2024, 2:54 PM

## 2024-05-22 NOTE — Progress Notes (Signed)
 VASCULAR LAB    Left lower extremity venous duplex has been performed.  See CV proc for preliminary results.  Relayed results to Hamp Bow, PA-C via secure chat  RACHEL PELLET, RVT 05/22/2024, 11:51 AM

## 2024-05-22 NOTE — ED Notes (Signed)
 Pt transported to vascular.

## 2024-05-22 NOTE — Progress Notes (Signed)
 Patient received from ED, V/S obtained, CCMD notified, CHG bath given, complaint of pain, PRN analgesic provided, all needs met, call bell in reach.    05/22/24 1412  Vitals  Temp 98.2 F (36.8 C)  Temp Source Oral  BP (!) 147/87  MAP (mmHg) 104  BP Location Left Arm  BP Method Automatic  Patient Position (if appropriate) Lying  Pulse Rate 84  Pulse Rate Source Monitor  ECG Heart Rate 83  Resp 15  Level of Consciousness  Level of Consciousness Alert  MEWS COLOR  MEWS Score Color Green  Oxygen Therapy  SpO2 100 %  O2 Device Room Air  Pain Assessment  Pain Scale 0-10  Pain Score 10  Pain Type Acute pain  Pain Location Leg  Pain Orientation Left  Pain Descriptors / Indicators Aching;Sore  Pain Frequency Constant  Pain Onset On-going  Patients Stated Pain Goal 0  Pain Intervention(s) Medication (See eMAR)  MEWS Score  MEWS Temp 0  MEWS Systolic 0  MEWS Pulse 0  MEWS RR 0  MEWS LOC 0  MEWS Score 0

## 2024-05-22 NOTE — Progress Notes (Signed)
 VASCULAR LAB    ABI has been performed.  See CV proc for preliminary results.  Results relayed to [facility] via secure chat  Andrius Andrepont, RVT 05/22/2024, 11:52 AM

## 2024-05-22 NOTE — ED Triage Notes (Signed)
 Pt c.o 2 months of left leg pain/cramping. Pt also c.o ulcer on the back of his ankle. Pt seen at his PCP and has a vascular study ordered for later this month,  Pt receives dialysis MWF, last treatment was Friday.

## 2024-05-22 NOTE — H&P (Signed)
 History and Physical   [name] FMW:[id number] DOB: 04/14/1951 DOA: 05/22/2024  PCP: Joshua [name] CROME, MD   Patient coming from: Home  Chief Complaint: Leg pain  HPI: [name] is a 73 y.o. male with medical history significant of hypertension, hyperlipidemia, diabetes, PAD, seizure, ESRD on HD, anemia, diastolic CHF presenting with progressive leg pain.  Patient has had about 2 months of cramping pain in his left lower extremity.  Has developed a ulcer at his left ankle over the Achilles as well.  Saw his PCP who had ordered outpatient ABIs.  Did see podiatry 9/10 and was evaluated for paronychia which was incised and drained and at that time it was felt his Achilles symptoms were Achilles tendinitis.  Patient denies fevers, chills, chest pain, shortness of breath, abdominal pain, constipation, diarrhea, nausea, vomiting.  ED Course: Vital signs in the ED notable for blood pressure in the 140s-180s systolic.  Heart rate in the 90s-100s.  Lab workup included CMP with chloride 91, BUN 54, creatinine 9.56, glucose 112, albumin 3.2, normal bicarb but gap of 25.  CBC with hemoglobin stable 11.3.  PT and INR normal.  Lactic acid negative x 2.  DVT study on the left was negative but showed significant calcifications.  ABI showed noncompressible lower extremity vessels bilaterally.  Patient received oxycodone  in the ED.  Vascular surgery consulted and are seeing the patient with plan for inpatient management of ischemic limb.  Review of Systems: As per HPI otherwise all other systems reviewed and are negative.  Past Medical History:  Diagnosis Date   Anemia    d/t ESRD   Arthritis    Benign colon polyp 08/11/2010   CHF (congestive heart failure) (HCC)    Clot    STATED TOLD IN APRIL CLOT TO LEFT SHOULDER. 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   HTN (hypertension)    Hyperlipidemia    Need for prophylactic vaccination and inoculation against varicella 12/26/2021   Need for prophylactic vaccination with combined diphtheria-tetanus-pertussis (DTP) vaccine 12/26/2021   Neuromuscular disorder (HCC)    neuropathy down LLE   Pneumonia    Seizures (HCC)     Past Surgical History:  Procedure Laterality Date   AV FISTULA PLACEMENT  01/19/2012   Procedure: ARTERIOVENOUS (AV) FISTULA CREATION;  Surgeon: Redell CROME Door, MD;  Location: [facility] OR;  Service: Vascular;  Laterality: Right;  Right Brachio-cephalic arteriovenous fistula   AV FISTULA PLACEMENT Right 07/18/2021   Procedure: INSERTION OF RIGHT UPPER EXTREMITY ARTERIOVENOUS (AV) GORE-TEX GRAFT;  Surgeon: Magda [name] SAILOR, MD;  Location: [facility] OR;  Service: Vascular;  Laterality: Right;  PERIPHERAL NERVE BLOCK   AV FISTULA PLACEMENT, RADIOCEPHALIC  12/31/10   Right arm   INSERTION OF DIALYSIS CATHETER  01/19/2012   Procedure: INSERTION OF DIALYSIS CATHETER;  Surgeon: Redell CROME Door, MD;  Location: [facility] OR;  Service: Vascular;  Laterality: N/A;  right internal jugular vein   TRANSESOPHAGEAL ECHOCARDIOGRAM (CATH LAB) N/A 09/23/2023   Procedure: TRANSESOPHAGEAL ECHOCARDIOGRAM;  Surgeon: Santo Stanly LABOR, MD;  Location: [facility] INVASIVE CV LAB;  Service: Cardiovascular;  Laterality: N/A;   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   UPPER EXTREMITY VENOGRAPHY Right 06/11/2021   Procedure: UPPER EXTREMITY VENOGRAPHY;  Surgeon: Serene Gaile ORN, MD;  Location: [facility] INVASIVE CV LAB;  Service: Cardiovascular;  Laterality: Right;   VENOGRAM Left 05/30/2021   Procedure: DIAGNOSTIC CENTRAL VENOGRAM;  Surgeon: Lanis Fonda BRAVO, MD;  Location: [facility] OR;  Service: Vascular;  Laterality: Left;    Social History  reports that he has never smoked. He has never been exposed to tobacco smoke. He has never used smokeless tobacco. He reports current alcohol use of about  1.0 standard drink of alcohol per week. He reports that he does not use drugs.  No Known Allergies  Family History  Problem Relation Age of Onset   Diabetes Father    Colon cancer Neg Hx    Esophageal cancer Neg Hx    Stomach cancer Neg Hx    Rectal cancer Neg Hx   Reviewed on admission  Prior to Admission medications   Medication Sig Start Date End Date Taking? Authorizing Provider  amLODipine  (NORVASC ) 10 MG tablet Take 10 mg by mouth at bedtime. 06/30/23   [provider]  carvedilol  (COREG ) 25 MG tablet Take 25 mg by mouth 2 (two) times daily with a meal.    [provider]  cinacalcet  (SENSIPAR ) 30 MG tablet Take 2 tablets (60 mg total) by mouth every Monday, Wednesday, and Friday at 6 PM. 11/23/23   Rosario Leatrice FERNS, MD  polyethylene glycol (MIRALAX  / GLYCOLAX ) 17 g packet Take 17 g by mouth daily. 11/24/23   Rosario Leatrice FERNS, MD  sevelamer  carbonate (RENVELA ) 800 MG tablet Take 4 tablets by mouth 3 (three) times daily with meals. 04/01/21   [provider]  sodium polystyrene (KAYEXALATE) powder Take 30 g by mouth 2 (two) times a week. Thursdays Sundays 12/16/23   [provider]  traMADol  (ULTRAM ) 50 MG tablet Take 1 tablet (50 mg total) by mouth every 6 (six) hours as needed. 04/06/24   Norleen Lynwood ORN, MD  traMADol  (ULTRAM ) 50 MG tablet Take 1 tablet (50 mg total) by mouth every 6 (six) hours as needed. 04/12/24 04/12/25  Elisabeth Valli BIRCH, MD    Physical Exam: Vitals:   05/22/24 0943 05/22/24 1000 05/22/24 1150 05/22/24 1300  BP:  (!) 174/79 (!) 142/82 (!) 167/87  Pulse:  93 96 93  Resp:  18 17 17   Temp:   98.1 F (36.7 C)   TempSrc:   Oral   SpO2: 98% 99% 99% 97%  Weight:      Height:        Physical Exam Constitutional:      General: He is not in acute distress.    Appearance: Normal appearance.  HENT:     Head: Normocephalic and atraumatic.     Mouth/Throat:     Mouth: Mucous membranes are moist.     Pharynx: Oropharynx is  clear.  Eyes:     Extraocular Movements: Extraocular movements intact.     Pupils: Pupils are equal, round, and reactive to light.  Cardiovascular:     Rate and Rhythm: Normal rate and regular rhythm.     Heart sounds: Normal heart sounds.     Comments: LLE pulses nonpalpable for me Pulmonary:     Effort: Pulmonary effort is normal. No respiratory distress.     Breath sounds: Normal breath sounds.  Abdominal:     General: Bowel sounds are normal. There is no distension.     Palpations: Abdomen is soft.     Tenderness: There is no abdominal tenderness.  Musculoskeletal:        General: No swelling or  deformity.  Skin:    General: Skin is warm and dry.  Neurological:     General: No focal deficit present.     Mental Status: Mental status is at baseline.    Labs on Admission: I have personally reviewed following labs and imaging studies  CBC: Recent Labs  Lab 05/22/24 0845  WBC 6.6  NEUTROABS 4.4  HGB 11.3*  HCT 35.9*  MCV 93.0  PLT 179    Basic Metabolic Panel: Recent Labs  Lab 05/22/24 0845  NA 142  K 4.8  CL 91*  CO2 26  GLUCOSE 112*  BUN 54*  CREATININE 9.56*  CALCIUM  8.9  MG 2.6*    GFR: Estimated Creatinine Clearance: 7 mL/min (A) (by C-G formula based on SCr of 9.56 mg/dL (H)).  Liver Function Tests: Recent Labs  Lab 05/22/24 0845  AST 20  ALT 12  ALKPHOS 111  BILITOT 0.8  PROT 7.8  ALBUMIN 3.2*    Urine analysis:    Component Value Date/Time   COLORURINE YELLOW 09/16/2020 1256   APPEARANCEUR CLOUDY (A) 09/16/2020 1256   LABSPEC 1.011 09/16/2020 1256   PHURINE 6.0 09/16/2020 1256   GLUCOSEU NEGATIVE 09/16/2020 1256   GLUCOSEU NEGATIVE 07/30/2011 1440   HGBUR SMALL (A) 09/16/2020 1256   BILIRUBINUR NEGATIVE 09/16/2020 1256   KETONESUR NEGATIVE 09/16/2020 1256   PROTEINUR 100 (A) 09/16/2020 1256   UROBILINOGEN 0.2 09/30/2011 1824   NITRITE NEGATIVE 09/16/2020 1256   LEUKOCYTESUR LARGE (A) 09/16/2020 1256    Radiological Exams on  Admission: VAS US  ABI WITH/WO TBI Result Date: 05/22/2024  LOWER EXTREMITY DOPPLER STUDY Patient Name:  [name]  Date of Exam:   05/22/2024 Medical Rec #: [id number]          Accession #:    [id number] Date of Birth: 10-06-1950          Patient Gender: M Patient Age:   41 years Exam Location:  [facility] Procedure:      VAS US  ABI WITH/WO TBI Referring Phys: HAMP FONDAW --------------------------------------------------------------------------------  Indications: Claudication, rest pain, and peripheral artery disease. High Risk Factors: Hypertension, hyperlipidemia, Diabetes, no history of                    smoking. Other Factors: ESRD on dialysis, failed kidney transplant,.  Limitations: Today's exam was limited due to involuntary patient movement and              patient unable to extend leg secondary to rest pain. Comparison Study: Prior ABI done 03/18/21 Performing Technologist: Rachel Pellet RVS  Examination Guidelines: A complete evaluation includes at minimum, Doppler waveform signals and systolic blood pressure reading at the level of bilateral brachial, anterior tibial, and posterior tibial arteries, when vessel segments are accessible. Bilateral testing is considered an integral part of a complete examination. Photoelectric Plethysmograph (PPG) waveforms and toe systolic pressure readings are included as required and additional duplex testing as needed. Limited examinations for reoccurring indications may be performed as noted.  ABI Findings: +---------+------------------+-----+-----------+-------------------+ Right    Rt Pressure (mmHg)IndexWaveform   Comment             +---------+------------------+-----+-----------+-------------------+ Brachial                                   Restricted/dialysis +---------+------------------+-----+-----------+-------------------+ PTA      254  1.46 multiphasic                     +---------+------------------+-----+-----------+-------------------+ DP       254               1.46 multiphasic                    +---------+------------------+-----+-----------+-------------------+ Great Toe61                0.35 Normal                         +---------+------------------+-----+-----------+-------------------+ +---------+------------------+-----+-------------------+-------+ Left     Lt Pressure (mmHg)IndexWaveform           Comment +---------+------------------+-----+-------------------+-------+ Brachial 174                    triphasic                  +---------+------------------+-----+-------------------+-------+ PTA      254               1.46 dampened monophasic        +---------+------------------+-----+-------------------+-------+ DP       254               1.46 dampened monophasic        +---------+------------------+-----+-------------------+-------+ Great Toe0                 0.00 Absent                     +---------+------------------+-----+-------------------+-------+ +-------+---------------------+-----------+------------+------------+ ABI/TBIToday's ABI          Today's TBIPrevious ABIPrevious TBI +-------+---------------------+-----------+------------+------------+ Right  1.46/non compressible0.35       0.94        0.52         +-------+---------------------+-----------+------------+------------+ Left   1.46/non compressibleabsent     1.06        0.54         +-------+---------------------+-----------+------------+------------+  Arterial wall calcification precludes accurate ankle pressures and ABIs. Bilateral TBIs appear decreased compared to prior study on 03/18/21. Bilateral ABIs appear calcified compared to prior study on 03/18/21.  Summary: Right: Resting right ankle-brachial index indicates noncompressible right lower extremity arteries. The right toe-brachial index is abnormal.  Left: Resting left ankle-brachial  index indicates noncompressible left lower extremity arteries, however, waveforms are severely dampened. The left toe-brachial index is abnormal.  *See table(s) above for measurements and observations.  Suggest Peripheral Vascular Consult.    Preliminary    VAS US  LOWER EXTREMITY VENOUS (DVT) (7a-7p) Result Date: 05/22/2024  Lower Venous DVT Study Patient Name:  [name]  Date of Exam:   05/22/2024 Medical Rec #: [id number]          Accession #:    [id number] Date of Birth: 04/01/1951          Patient Gender: M Patient Age:   65 years Exam [facility]:  [facility] Procedure:      VAS US  LOWER EXTREMITY VENOUS (DVT) Referring Phys: HAMP FONDAW --------------------------------------------------------------------------------  Indications: Pain. Patient had recent procedure for ingrown left great toe with Podiatry 04/20/24. Now, with ischemic type pain in left lower leg and foot.  Limitations: Shadowing from arterial calcification. Comparison Study: No prior left LEV on file Performing Technologist: Alberta Lis RVS  Examination Guidelines: A complete evaluation includes B-mode imaging, spectral Doppler, color Doppler, and power Doppler as needed of all  accessible portions of each vessel. Bilateral testing is considered an integral part of a complete examination. Limited examinations for reoccurring indications may be performed as noted. The reflux portion of the exam is performed with the patient in reverse Trendelenburg.  +-----+---------------+---------+-----------+----------+--------------+ RIGHTCompressibilityPhasicitySpontaneityPropertiesThrombus Aging +-----+---------------+---------+-----------+----------+--------------+ CFV  Full           Yes      Yes                                 +-----+---------------+---------+-----------+----------+--------------+ SFJ  Full                                                         +-----+---------------+---------+-----------+----------+--------------+   +---------+---------------+---------+-----------+----------+--------------+ LEFT     CompressibilityPhasicitySpontaneityPropertiesThrombus Aging +---------+---------------+---------+-----------+----------+--------------+ CFV      Full           Yes      Yes                                 +---------+---------------+---------+-----------+----------+--------------+ SFJ      Full                                                        +---------+---------------+---------+-----------+----------+--------------+ FV Prox  Full                                                        +---------+---------------+---------+-----------+----------+--------------+ FV Mid   Full                                                        +---------+---------------+---------+-----------+----------+--------------+ FV DistalFull                                                        +---------+---------------+---------+-----------+----------+--------------+ PFV      Full                                                        +---------+---------------+---------+-----------+----------+--------------+ POP      Full           Yes      Yes                                 +---------+---------------+---------+-----------+----------+--------------+ PTV      Full                                                        +---------+---------------+---------+-----------+----------+--------------+  PERO     Full                                                        +---------+---------------+---------+-----------+----------+--------------+ Gastroc  Full                                                        +---------+---------------+---------+-----------+----------+--------------+    Summary: RIGHT: - No evidence of common femoral vein obstruction.  - Incidentally, heavy calcification noted in the CFA and  FA  LEFT: - There is no evidence of deep vein thrombosis in the lower extremity.  - No cystic structure found in the popliteal fossa. - Incidentally, heavy calcification noted throughout the left lower extremity arterial system  *See table(s) above for measurements and observations.    Preliminary    EKG: Not performed in the emergency department  Assessment/Plan Principal Problem:   Critical limb ischemia of left lower extremity (HCC) Active Problems:   ESRD on dialysis [facility])   Essential hypertension   Seizure disorder (HCC)   History of anemia due to chronic kidney disease   Dyslipidemia, goal LDL below 100   PAD (peripheral artery disease)   Chronic diastolic CHF (congestive heart failure) (HCC)   DM2 (diabetes mellitus, type 2) (HCC)   Limb ischemia Ischemic ulcer PAD > Patient presenting with progressive pain on his left lower extremity.  Has also developed ulceration of his ankle overlying his left Achilles tendon. > No DVT on study in ED but does have significant calcifications of the arterial system.  ABI performed and showed noncompressible vessels bilaterally. > Vascular surgery consulted and requested admission for likely intervention and are coming to see the patient. - Monitor in telemetry - Appreciate vascular surgery recommendations and assistance - Prophylactic dose heparin  for now - Angiogram planned for tomorrow - Supportive care  ESRD on HD > Dialyzes MWF.  Last HD was Friday.  History of prior renal transplant. - Nephrology consulted for dialysis needs while admitted. - Trend renal function and electrolytes  Chronic diastolic CHF > Last echo was in February which showed EF 45-50%, indeterminate diastolic function, normal RV function. - Volume managed with dialysis - Continue Coreg   Hypertension - Continue amlodipine  and Coreg   Hyperlipidemia -No long taking Statin, will resume in the setting of above  Diabetes - SSI  History of seizure > Seizure  earlier this year.  Believed to be provoked in the setting of missing HD for 11 days.  Followed up with neurology who did not recommend to continue on antiepileptics. - Noted  Anemia > Hemoglobin stable at 11.3. - Trend CBC  DVT prophylaxis: Heparin  Code Status:   Full Family Communication:  Updated at bedside  Disposition Plan:   Patient is from:  Home  Anticipated DC to:  Home  Anticipated DC date:  1 to 3 days  Anticipated DC barriers: None  Consults called:  Vascular surgery, nephrology Admission status:  Observation, telemetry  Severity of Illness: The appropriate patient status for this patient is OBSERVATION. Observation status is judged to be reasonable and necessary in order to provide the required intensity of service to ensure the patient's safety. The  patient's presenting symptoms, physical exam findings, and initial radiographic and laboratory data in the context of their medical condition is felt to place them at decreased risk for further clinical deterioration. Furthermore, it is anticipated that the patient will be medically stable for discharge from the hospital within 2 midnights of admission.    Marsa KATHEE Scurry MD [facility]  How to contact the [facility] Attending or Consulting provider 7A - 7P or covering provider during after hours 7P -7A, for this patient?   Check the care team in [facility] and look for a) attending/consulting [facility] provider listed and b) the [facility] team listed Log into [email or web address] and use [place]'s universal password to access. If you do not have the password, please contact the hospital operator. Locate the [facility] provider you are looking for under [facility] and page to a number that you can be directly reached. If you still have difficulty reaching the provider, please page the [facility] (Director on Call) for the Hospitalists listed on amion for assistance.  05/22/2024, 1:35 PM

## 2024-05-22 NOTE — ED Notes (Signed)
 Floor notified patient coming

## 2024-05-23 ENCOUNTER — Encounter (HOSPITAL_COMMUNITY)
Admission: EM | Disposition: A | Discharge status: Home Health | Payer: Self-pay | Source: Home / Self Care | Attending: Internal Medicine

## 2024-05-23 DIAGNOSIS — Z48812 Encounter for surgical aftercare following surgery on the circulatory system: Secondary | ICD-10-CM | POA: Diagnosis not present

## 2024-05-23 DIAGNOSIS — M79672 Pain in left foot: Secondary | ICD-10-CM | POA: Diagnosis not present

## 2024-05-23 DIAGNOSIS — Z79899 Other long term (current) drug therapy: Secondary | ICD-10-CM | POA: Diagnosis not present

## 2024-05-23 DIAGNOSIS — G40909 Epilepsy, unspecified, not intractable, without status epilepticus: Secondary | ICD-10-CM | POA: Diagnosis present

## 2024-05-23 DIAGNOSIS — R252 Cramp and spasm: Secondary | ICD-10-CM | POA: Diagnosis present

## 2024-05-23 DIAGNOSIS — I70222 Atherosclerosis of native arteries of extremities with rest pain, left leg: Secondary | ICD-10-CM | POA: Diagnosis present

## 2024-05-23 DIAGNOSIS — N186 End stage renal disease: Secondary | ICD-10-CM | POA: Diagnosis present

## 2024-05-23 DIAGNOSIS — E119 Type 2 diabetes mellitus without complications: Secondary | ICD-10-CM | POA: Diagnosis not present

## 2024-05-23 DIAGNOSIS — I70244 Atherosclerosis of native arteries of left leg with ulceration of heel and midfoot: Secondary | ICD-10-CM | POA: Diagnosis present

## 2024-05-23 DIAGNOSIS — E209 Hypoparathyroidism, unspecified: Secondary | ICD-10-CM | POA: Diagnosis present

## 2024-05-23 DIAGNOSIS — I132 Hypertensive heart and chronic kidney disease with heart failure and with stage 5 chronic kidney disease, or end stage renal disease: Secondary | ICD-10-CM | POA: Diagnosis present

## 2024-05-23 DIAGNOSIS — T8612 Kidney transplant failure: Secondary | ICD-10-CM | POA: Diagnosis present

## 2024-05-23 DIAGNOSIS — Y838 Other surgical procedures as the cause of abnormal reaction of the patient, or of later complication, without mention of misadventure at the time of the procedure: Secondary | ICD-10-CM | POA: Diagnosis present

## 2024-05-23 DIAGNOSIS — D631 Anemia in chronic kidney disease: Secondary | ICD-10-CM | POA: Diagnosis present

## 2024-05-23 DIAGNOSIS — Z860100 Personal history of colon polyps, unspecified: Secondary | ICD-10-CM | POA: Diagnosis not present

## 2024-05-23 DIAGNOSIS — I701 Atherosclerosis of renal artery: Secondary | ICD-10-CM | POA: Diagnosis not present

## 2024-05-23 DIAGNOSIS — Z833 Family history of diabetes mellitus: Secondary | ICD-10-CM | POA: Diagnosis not present

## 2024-05-23 DIAGNOSIS — E7800 Pure hypercholesterolemia, unspecified: Secondary | ICD-10-CM | POA: Diagnosis present

## 2024-05-23 DIAGNOSIS — E1122 Type 2 diabetes mellitus with diabetic chronic kidney disease: Secondary | ICD-10-CM | POA: Diagnosis present

## 2024-05-23 DIAGNOSIS — E1151 Type 2 diabetes mellitus with diabetic peripheral angiopathy without gangrene: Secondary | ICD-10-CM | POA: Diagnosis present

## 2024-05-23 DIAGNOSIS — I10 Essential (primary) hypertension: Secondary | ICD-10-CM | POA: Diagnosis not present

## 2024-05-23 DIAGNOSIS — I5042 Chronic combined systolic (congestive) and diastolic (congestive) heart failure: Secondary | ICD-10-CM | POA: Diagnosis present

## 2024-05-23 DIAGNOSIS — E11621 Type 2 diabetes mellitus with foot ulcer: Secondary | ICD-10-CM | POA: Diagnosis present

## 2024-05-23 DIAGNOSIS — Z95820 Peripheral vascular angioplasty status with implants and grafts: Secondary | ICD-10-CM | POA: Diagnosis not present

## 2024-05-23 DIAGNOSIS — M109 Gout, unspecified: Secondary | ICD-10-CM | POA: Diagnosis present

## 2024-05-23 DIAGNOSIS — G4733 Obstructive sleep apnea (adult) (pediatric): Secondary | ICD-10-CM | POA: Diagnosis present

## 2024-05-23 DIAGNOSIS — E1142 Type 2 diabetes mellitus with diabetic polyneuropathy: Secondary | ICD-10-CM | POA: Diagnosis present

## 2024-05-23 DIAGNOSIS — L97329 Non-pressure chronic ulcer of left ankle with unspecified severity: Secondary | ICD-10-CM | POA: Diagnosis present

## 2024-05-23 DIAGNOSIS — Z992 Dependence on renal dialysis: Secondary | ICD-10-CM | POA: Diagnosis not present

## 2024-05-23 HISTORY — PX: ABDOMINAL AORTOGRAM: CATH118222

## 2024-05-23 HISTORY — PX: LOWER EXTREMITY INTERVENTION: CATH118252

## 2024-05-23 HISTORY — PX: LOWER EXTREMITY ANGIOGRAPHY: CATH118251

## 2024-05-23 LAB — GLUCOSE, CAPILLARY
Glucose-Capillary: 91 mg/dL (ref 70–99)
Glucose-Capillary: 99 mg/dL (ref 70–99)

## 2024-05-23 LAB — HEMOGLOBIN A1C
Hgb A1c MFr Bld: 4.7 % — ABNORMAL LOW (ref 4.8–5.6)
Mean Plasma Glucose: 88.19 mg/dL

## 2024-05-23 LAB — CBC
HCT: 31.7 % — ABNORMAL LOW (ref 39.0–52.0)
Hemoglobin: 10.2 g/dL — ABNORMAL LOW (ref 13.0–17.0)
MCH: 29 pg (ref 26.0–34.0)
MCHC: 32.2 g/dL (ref 30.0–36.0)
MCV: 90.1 fL (ref 80.0–100.0)
Platelets: 172 K/uL (ref 150–400)
RBC: 3.52 MIL/uL — ABNORMAL LOW (ref 4.22–5.81)
RDW: 14 % (ref 11.5–15.5)
WBC: 6.2 K/uL (ref 4.0–10.5)
nRBC: 0 % (ref 0.0–0.2)

## 2024-05-23 LAB — COMPREHENSIVE METABOLIC PANEL WITH GFR
ALT: 10 U/L (ref 0–44)
AST: 16 U/L (ref 15–41)
Albumin: 3.1 g/dL — ABNORMAL LOW (ref 3.5–5.0)
Alkaline Phosphatase: 105 U/L (ref 38–126)
Anion gap: 19 — ABNORMAL HIGH (ref 5–15)
BUN: 69 mg/dL — ABNORMAL HIGH (ref 8–23)
CO2: 25 mmol/L (ref 22–32)
Calcium: 8.8 mg/dL — ABNORMAL LOW (ref 8.9–10.3)
Chloride: 91 mmol/L — ABNORMAL LOW (ref 98–111)
Creatinine, Ser: 11.23 mg/dL — ABNORMAL HIGH (ref 0.61–1.24)
GFR, Estimated: 4 mL/min — ABNORMAL LOW (ref >60)
Glucose, Bld: 95 mg/dL (ref 70–99)
Potassium: 5.6 mmol/L — ABNORMAL HIGH (ref 3.5–5.1)
Sodium: 135 mmol/L (ref 135–145)
Total Bilirubin: 0.8 mg/dL (ref 0.0–1.2)
Total Protein: 7.2 g/dL (ref 6.5–8.1)

## 2024-05-23 SURGERY — LOWER EXTREMITY ANGIOGRAPHY
Anesthesia: LOCAL

## 2024-05-23 MED ORDER — PENTAFLUOROPROP-TETRAFLUOROETH EX AERO: 1.0000 | INHALATION_SPRAY | CUTANEOUS | Status: DC | PRN

## 2024-05-23 MED ORDER — HEPARIN SODIUM (PORCINE) 5000 UNIT/ML IJ SOLN
5000.0000 [IU] | Freq: Three times a day (TID) | INTRAMUSCULAR | Status: DC
  Administered 2024-05-24 – 2024-05-25 (×6): 5000 [IU] via SUBCUTANEOUS
  Filled 2024-05-23 (×6): qty 1

## 2024-05-23 MED ORDER — LIDOCAINE HCL (PF) 1 % IJ SOLN: 5.0000 mL | INTRAMUSCULAR | Status: DC | PRN

## 2024-05-23 MED ORDER — IODIXANOL 320 MG/ML IV SOLN
INTRAVENOUS | Status: DC | PRN
  Administered 2024-05-23: 100 mL

## 2024-05-23 MED ORDER — MIDAZOLAM HCL 2 MG/2ML IJ SOLN
INTRAMUSCULAR | Status: AC
  Filled 2024-05-23: qty 2

## 2024-05-23 MED ORDER — ASPIRIN 81 MG PO CHEW
CHEWABLE_TABLET | ORAL | Status: DC | PRN
  Administered 2024-05-23: 81 mg via ORAL

## 2024-05-23 MED ORDER — SODIUM CHLORIDE 0.9% FLUSH: 3.0000 mL | INTRAVENOUS | Status: DC | PRN

## 2024-05-23 MED ORDER — ORAL CARE MOUTH RINSE: 15.0000 mL | OROMUCOSAL | Status: DC | PRN

## 2024-05-23 MED ORDER — LIDOCAINE-PRILOCAINE 2.5-2.5 % EX CREA
1.0000 | TOPICAL_CREAM | CUTANEOUS | Status: DC | PRN
Start: 2024-05-23 — End: 2024-05-24

## 2024-05-23 MED ORDER — LIDOCAINE HCL (PF) 1 % IJ SOLN
INTRAMUSCULAR | Status: DC | PRN
  Administered 2024-05-23: 18 mL
  Administered 2024-05-23: 5 mL

## 2024-05-23 MED ORDER — LIDOCAINE HCL (PF) 1 % IJ SOLN
INTRAMUSCULAR | Status: AC
  Filled 2024-05-23: qty 30

## 2024-05-23 MED ORDER — MIDAZOLAM HCL 2 MG/2ML IJ SOLN
INTRAMUSCULAR | Status: DC | PRN
  Administered 2024-05-23: 1 mg via INTRAVENOUS
  Administered 2024-05-23: 2 mg via INTRAVENOUS

## 2024-05-23 MED ORDER — ASPIRIN 81 MG PO CHEW
CHEWABLE_TABLET | ORAL | Status: AC
  Filled 2024-05-23: qty 1

## 2024-05-23 MED ORDER — HEPARIN SODIUM (PORCINE) 1000 UNIT/ML IJ SOLN
INTRAMUSCULAR | Status: DC | PRN
  Administered 2024-05-23: 7000 [IU] via INTRAVENOUS

## 2024-05-23 MED ORDER — ASPIRIN 81 MG PO TBEC
81.0000 mg | DELAYED_RELEASE_TABLET | Freq: Every day | ORAL | Status: DC
  Administered 2024-05-24: 81 mg via ORAL
  Filled 2024-05-23: qty 1

## 2024-05-23 MED ORDER — HEPARIN SODIUM (PORCINE) 1000 UNIT/ML IJ SOLN
INTRAMUSCULAR | Status: AC
  Filled 2024-05-23: qty 10

## 2024-05-23 MED ORDER — HEPARIN (PORCINE) IN NACL 1000-0.9 UT/500ML-% IV SOLN
INTRAVENOUS | Status: DC | PRN
  Administered 2024-05-23 (×2): 500 mL

## 2024-05-23 MED ORDER — CLOPIDOGREL BISULFATE 300 MG PO TABS
ORAL_TABLET | ORAL | Status: DC | PRN
  Administered 2024-05-23: 300 mg via ORAL

## 2024-05-23 MED ORDER — FENTANYL CITRATE (PF) 100 MCG/2ML IJ SOLN
INTRAMUSCULAR | Status: DC | PRN
  Administered 2024-05-23 (×2): 50 ug via INTRAVENOUS

## 2024-05-23 MED ORDER — SODIUM CHLORIDE 0.9% FLUSH
3.0000 mL | Freq: Two times a day (BID) | INTRAVENOUS | Status: DC
  Administered 2024-05-24 (×2): 3 mL via INTRAVENOUS

## 2024-05-23 MED ORDER — CLOPIDOGREL BISULFATE 300 MG PO TABS
ORAL_TABLET | ORAL | Status: AC
  Filled 2024-05-23: qty 1

## 2024-05-23 MED ORDER — ANTICOAGULANT SODIUM CITRATE 4% (200MG/5ML) IV SOLN: 5.0000 mL | Status: DC | PRN

## 2024-05-23 MED ORDER — ONDANSETRON HCL 4 MG/2ML IJ SOLN: 4.0000 mg | Freq: Four times a day (QID) | INTRAMUSCULAR | Status: DC | PRN

## 2024-05-23 MED ORDER — MORPHINE SULFATE (PF) 2 MG/ML IV SOLN
2.0000 mg | Freq: Once | INTRAVENOUS | Status: AC
  Administered 2024-05-23: 2 mg via INTRAVENOUS
  Filled 2024-05-23: qty 1

## 2024-05-23 MED ORDER — HYDRALAZINE HCL 20 MG/ML IJ SOLN: 5.0000 mg | INTRAMUSCULAR | Status: DC | PRN

## 2024-05-23 MED ORDER — ACETAMINOPHEN 325 MG PO TABS: 650.0000 mg | ORAL_TABLET | ORAL | Status: DC | PRN

## 2024-05-23 MED ORDER — HEPARIN SODIUM (PORCINE) 1000 UNIT/ML DIALYSIS: 1000.0000 [IU] | INTRAMUSCULAR | Status: DC | PRN

## 2024-05-23 MED ORDER — ALTEPLASE 2 MG IJ SOLR: 2.0000 mg | Freq: Once | INTRAMUSCULAR | Status: DC | PRN

## 2024-05-23 MED ORDER — FENTANYL CITRATE (PF) 100 MCG/2ML IJ SOLN
INTRAMUSCULAR | Status: AC
  Filled 2024-05-23: qty 2

## 2024-05-23 MED ORDER — CLOPIDOGREL BISULFATE 75 MG PO TABS
75.0000 mg | ORAL_TABLET | Freq: Every day | ORAL | Status: DC
  Administered 2024-05-24 – 2024-05-25 (×2): 75 mg via ORAL
  Filled 2024-05-23 (×2): qty 1

## 2024-05-23 MED ORDER — SODIUM CHLORIDE 0.9 % IV SOLN: 250.0000 mL | INTRAVENOUS | Status: AC | PRN

## 2024-05-23 MED ORDER — LABETALOL HCL 5 MG/ML IV SOLN: 10.0000 mg | INTRAVENOUS | Status: DC | PRN

## 2024-05-23 SURGICAL SUPPLY — 25 items
BALLOON STERLING OTW 3X150X150 (BALLOONS) IMPLANT
BALLOON STERLING OTW 5X150X150 (BALLOONS) IMPLANT
CATH OMNI FLUSH 5F 65CM (CATHETERS) IMPLANT
CATH QUICKCROSS SUPP .035X90CM (MICROCATHETER) IMPLANT
CATH SHOCKWAVE E8 5X80 (CATHETERS) IMPLANT
CATH SPRT STRG 65X2.6FR ACPT (CATHETERS) IMPLANT
CLOSURE MYNX CONTROL 6F/7F (Vascular Products) IMPLANT
DEVICE ONE SNARE 15MM (VASCULAR PRODUCTS) IMPLANT
DEVICE TORQUE .025-.038 (MISCELLANEOUS) IMPLANT
GLIDEWIRE ADV .035X260CM (WIRE) IMPLANT
GUIDEWIRE ANGLED .035 180CM (WIRE) IMPLANT
KIT ENCORE 26 ADVANTAGE (KITS) IMPLANT
KIT MICROPUNCTURE NIT STIFF (SHEATH) IMPLANT
KIT SINGLE USE MANIFOLD (KITS) IMPLANT
PACK CARDIAC CATHETERIZATION (CUSTOM PROCEDURE TRAY) IMPLANT
PAD HEMO NEPTUNE PLUS 2X2 (PAD) IMPLANT
SET ATX-X65L (MISCELLANEOUS) IMPLANT
SHEATH CATAPULT 6FR 45 (SHEATH) IMPLANT
SHEATH MICROPUNCTURE PEDAL 4FR (SHEATH) IMPLANT
SHEATH PINNACLE 5F 10CM (SHEATH) IMPLANT
SHEATH PINNACLE 6F 10CM (SHEATH) IMPLANT
STENT ELUVIA 6X150X130 (Permanent Stent) IMPLANT
WIRE BENTSON .035X145CM (WIRE) IMPLANT
WIRE G V18X300CM (WIRE) IMPLANT
WIRE SPARTACORE .014X300CM (WIRE) IMPLANT

## 2024-05-23 NOTE — Care Management Obs Status (Signed)
 MEDICARE OBSERVATION STATUS NOTIFICATION   Patient Details  Name: [name] MRN: [id number] Date of Birth: 08/24/1950   Medicare Observation Status Notification Given:  Yes    Vonzell Arrie Sharps 05/23/2024, 8:44 AM

## 2024-05-23 NOTE — Progress Notes (Signed)
 PROGRESS NOTE    [name]  FMW:[id number] DOB: [birth year] DOA: 05/22/2024 PCP: Joshua [name] CROME, MD   Brief Narrative:   73 y.o. male with medical history significant of hypertension, hyperlipidemia, diabetes, PAD, seizure, ESRD on HD, anemia, diastolic CHF presenting with progressive left leg pain which has been going on for 3 to 4 weeks now.  Testing negative for DVT.  ABI showed noncompressible lower extremity vessels bilaterally.  Vascular surgery consulted and plan is to do a left lower extremity angiogram today with possible intervention.    Assessment & Plan:  Principal Problem:   Ischemic ulcer of ankle (HCC) Active Problems:   ESRD on dialysis [facility])   Essential hypertension   Seizure disorder (HCC)   History of anemia due to chronic kidney disease   Dyslipidemia, goal LDL below 100   PAD (peripheral artery disease)   Chronic diastolic CHF (congestive heart failure) (HCC)   DM2 (diabetes mellitus, type 2) (HCC)   Critical limb ischemia of left lower extremity (HCC)    Left limb ischemia Early Achilles ischemic ulcer PAD, bilateral  -Lower extremity venous Doppler showed no evidence of DVT --ABI performed and showed noncompressible vessels bilaterally. -Vascular surgery consulted and plans to do left lower extremity angiogram today with possible intervention - As needed analgesics   ESRD on HD -Dialyzes MWF.  Last HD was Friday.  History of prior renal transplant. - Nephrology consulted  - No emergent indication for hemodialysis today   Chronic diastolic CHF, POA: OSA class III - Last echo was in February which showed EF 45-50%, indeterminate diastolic function, normal RV function. - Volume managed with dialysis - Continue Coreg    Hypertension - Continue amlodipine  and Coreg    Hyperlipidemia - Continue with statin   Diabetes type II - SSI   History of seizure: No acute issues  Disposition: Patient lives at home with his wife.  DVT prophylaxis:  heparin  injection 5,000 Units Start: 05/22/24 1400     Code Status: Full Code Family Communication: None at the bedside Status is: Inpatient Remains inpatient appropriate because: Left lower limb ischemia    Subjective:  He said that his left leg pain has been going on for almost 3 to 4 weeks now and since then, he is unable to walk.  His leg hurts even with slight touch. He is on hemodialysis and goes to dialysis on Monday, Wednesday and Friday.  He lives at home with his wife.  Examination:  General exam: Appears calm and comfortable  Respiratory system: Clear to auscultation. Respiratory effort normal. Cardiovascular system: S1 & S2 heard, RRR. No JVD, murmurs, rubs, gallops or clicks. No pedal edema. Gastrointestinal system: Abdomen is nondistended, soft and nontender. No organomegaly or masses felt. Normal bowel sounds heard. Central nervous system: Alert and oriented. No focal neurological deficits. Extremities: Tender to palpation left lower extremity, absent left distal pulses.  Left foot is cold Skin: No rashes, lesions or ulcers Psychiatry: Judgement and insight appear normal. Mood & affect appropriate.     Diet Orders (From admission, onward)     Start     Ordered   05/23/24 0709  Diet NPO time specified  Diet effective now        05/23/24 0708            Objective: Vitals:   05/22/24 1934 05/23/24 0002 05/23/24 0333 05/23/24 0816  BP: (!) 144/75 (!) 151/80 125/68 (!) 140/76  Pulse: 72 77 79 73  Resp: 19 18 18  16  Temp: 98.6 F (37 C) 98 F (36.7 C) 97.9 F (36.6 C) 97.7 F (36.5 C)  TempSrc: Oral Oral Oral Oral  SpO2: 98% 97% 96% 99%  Weight:      Height:        Intake/Output Summary (Last 24 hours) at 05/23/2024 0940 Last data filed at 05/23/2024 0800 Gross per 24 hour  Intake 240 ml  Output --  Net 240 ml   Filed Weights   05/22/24 0844  Weight: 70.3 kg    Scheduled Meds:  amLODipine   10 mg Oral QHS   atorvastatin  10 mg Oral Daily    carvedilol   25 mg Oral BID WC   Chlorhexidine  Gluconate Cloth  6 each Topical Q0600   cinacalcet   90 mg Oral Q M,W,F-HD   doxercalciferol   6 mcg Intravenous Q M,W,F-HD   heparin   5,000 Units Subcutaneous Q8H   insulin  aspart  0-6 Units Subcutaneous TID WC   sodium chloride  flush  3 mL Intravenous Q12H   Continuous Infusions:  Nutritional status     Body mass index is 22.24 kg/m.  Data Reviewed:   CBC: Recent Labs  Lab 05/22/24 0845 05/23/24 0407  WBC 6.6 6.2  NEUTROABS 4.4  --   HGB 11.3* 10.2*  HCT 35.9* 31.7*  MCV 93.0 90.1  PLT 179 172   Basic Metabolic Panel: Recent Labs  Lab 05/22/24 0845 05/23/24 0407  NA 142 135  K 4.8 5.6*  CL 91* 91*  CO2 26 25  GLUCOSE 112* 95  BUN 54* 69*  CREATININE 9.56* 11.23*  CALCIUM  8.9 8.8*  MG 2.6*  --    GFR: Estimated Creatinine Clearance: 6 mL/min (A) (by C-G formula based on SCr of 11.23 mg/dL (H)). Liver Function Tests: Recent Labs  Lab 05/22/24 0845 05/23/24 0407  AST 20 16  ALT 12 10  ALKPHOS 111 105  BILITOT 0.8 0.8  PROT 7.8 7.2  ALBUMIN 3.2* 3.1*   No results for input(s): LIPASE, AMYLASE in the last 168 hours. No results for input(s): AMMONIA in the last 168 hours. Coagulation Profile: Recent Labs  Lab 05/22/24 0845  INR 1.1   Cardiac Enzymes: No results for input(s): CKTOTAL, CKMB, CKMBINDEX, TROPONINI in the last 168 hours. BNP (last 3 results) Recent Labs    03/22/24 1344  PROBNP 391.0*   HbA1C: Recent Labs    05/23/24 0407  HGBA1C 4.7*   CBG: Recent Labs  Lab 05/22/24 1717 05/22/24 2103 05/23/24 0627  GLUCAP 101* 162* 91   Lipid Profile: No results for input(s): CHOL, HDL, LDLCALC, TRIG, CHOLHDL, LDLDIRECT in the last 72 hours. Thyroid  Function Tests: No results for input(s): TSH, T4TOTAL, FREET4, T3FREE, THYROIDAB in the last 72 hours. Anemia Panel: No results for input(s): VITAMINB12, FOLATE, FERRITIN, TIBC, IRON,  RETICCTPCT in the last 72 hours. Sepsis Labs: Recent Labs  Lab 05/22/24 0901 05/22/24 1159  LATICACIDVEN 1.2 1.7    No results found for this or any previous visit (from the past 240 hours).       Radiology Studies: VAS US  LOWER EXTREMITY VENOUS (DVT) (7a-7p) Result Date: 05/22/2024  Lower Venous DVT Study Patient Name:  [name]  Date of Exam:   05/22/2024 Medical Rec #: [id number]          Accession #:    [id number] Date of Birth: 1951/06/23          Patient Gender: M Patient Age:   6 years Exam Location:  [facility] Procedure:  VAS US  LOWER EXTREMITY VENOUS (DVT) Referring Phys: HAMP FONDAW --------------------------------------------------------------------------------  Indications: Pain. Patient had recent procedure for ingrown left great toe with Podiatry 04/20/24. Now, with ischemic type pain in left lower leg and foot.  Limitations: Shadowing from arterial calcification. Comparison Study: No prior left LEV on file Performing Technologist: Alberta Lis RVS  Examination Guidelines: A complete evaluation includes B-mode imaging, spectral Doppler, color Doppler, and power Doppler as needed of all accessible portions of each vessel. Bilateral testing is considered an integral part of a complete examination. Limited examinations for reoccurring indications may be performed as noted. The reflux portion of the exam is performed with the patient in reverse Trendelenburg.  +-----+---------------+---------+-----------+----------+--------------+ RIGHTCompressibilityPhasicitySpontaneityPropertiesThrombus Aging +-----+---------------+---------+-----------+----------+--------------+ CFV  Full           Yes      Yes                                 +-----+---------------+---------+-----------+----------+--------------+ SFJ  Full                                                        +-----+---------------+---------+-----------+----------+--------------+    +---------+---------------+---------+-----------+----------+--------------+ LEFT     CompressibilityPhasicitySpontaneityPropertiesThrombus Aging +---------+---------------+---------+-----------+----------+--------------+ CFV      Full           Yes      Yes                                 +---------+---------------+---------+-----------+----------+--------------+ SFJ      Full                                                        +---------+---------------+---------+-----------+----------+--------------+ FV Prox  Full                                                        +---------+---------------+---------+-----------+----------+--------------+ FV Mid   Full                                                        +---------+---------------+---------+-----------+----------+--------------+ FV DistalFull                                                        +---------+---------------+---------+-----------+----------+--------------+ PFV      Full                                                        +---------+---------------+---------+-----------+----------+--------------+  POP      Full           Yes      Yes                                 +---------+---------------+---------+-----------+----------+--------------+ PTV      Full                                                        +---------+---------------+---------+-----------+----------+--------------+ PERO     Full                                                        +---------+---------------+---------+-----------+----------+--------------+ Gastroc  Full                                                        +---------+---------------+---------+-----------+----------+--------------+     Summary: RIGHT: - No evidence of common femoral vein obstruction.  - Incidentally, heavy calcification noted in the CFA and FA  LEFT: - There is no evidence of deep vein thrombosis in the lower  extremity.  - No cystic structure found in the popliteal fossa. - Incidentally, heavy calcification noted throughout the left lower extremity arterial system  *See table(s) above for measurements and observations. Electronically signed by Gaile New MD on 05/22/2024 at 8:46:14 PM.    Final    VAS US  ABI WITH/WO TBI Result Date: 05/22/2024  LOWER EXTREMITY DOPPLER STUDY Patient Name:  [name]  Date of Exam:   05/22/2024 Medical Rec #: [id number]          Accession #:    [id number] Date of Birth: August 02, 1951          Patient Gender: M Patient Age:   79 years Exam Location[facility]  [facility] Procedure:      VAS US  ABI WITH/WO TBI Referring Phys: HAMP FONDAW --------------------------------------------------------------------------------  Indications: Claudication, rest pain, and peripheral artery disease. High Risk Factors: Hypertension, hyperlipidemia, Diabetes, no history of                    smoking. Other Factors: ESRD on dialysis, failed kidney transplant,.  Limitations: Today's exam was limited due to involuntary patient movement and              patient unable to extend leg secondary to rest pain. Comparison Study: Prior ABI done 03/18/21 Performing Technologist: Rachel Pellet RVS  Examination Guidelines: A complete evaluation includes at minimum, Doppler waveform signals and systolic blood pressure reading at the level of bilateral brachial, anterior tibial, and posterior tibial arteries, when vessel segments are accessible. Bilateral testing is considered an integral part of a complete examination. Photoelectric Plethysmograph (PPG) waveforms and toe systolic pressure readings are included as required and additional duplex testing as needed. Limited examinations for reoccurring indications may be performed as noted.  ABI Findings: +---------+------------------+-----+-----------+-------------------+ Right    Rt Pressure (mmHg)IndexWaveform   Comment              +---------+------------------+-----+-----------+-------------------+  Brachial                                   Restricted/dialysis +---------+------------------+-----+-----------+-------------------+ PTA      254               1.46 multiphasic                    +---------+------------------+-----+-----------+-------------------+ DP       254               1.46 multiphasic                    +---------+------------------+-----+-----------+-------------------+ Great Toe61                0.35 Normal                         +---------+------------------+-----+-----------+-------------------+ +---------+------------------+-----+-------------------+-------+ Left     Lt Pressure (mmHg)IndexWaveform           Comment +---------+------------------+-----+-------------------+-------+ Brachial 174                    triphasic                  +---------+------------------+-----+-------------------+-------+ PTA      254               1.46 dampened monophasic        +---------+------------------+-----+-------------------+-------+ DP       254               1.46 dampened monophasic        +---------+------------------+-----+-------------------+-------+ Great Toe0                 0.00 Absent                     +---------+------------------+-----+-------------------+-------+ +-------+---------------------+-----------+------------+------------+ ABI/TBIToday's ABI          Today's TBIPrevious ABIPrevious TBI +-------+---------------------+-----------+------------+------------+ Right  1.46/non compressible0.35       0.94        0.52         +-------+---------------------+-----------+------------+------------+ Left   1.46/non compressibleabsent     1.06        0.54         +-------+---------------------+-----------+------------+------------+ Arterial wall calcification precludes accurate ankle pressures and ABIs. Bilateral TBIs appear decreased compared to prior  study on 03/18/21. Bilateral ABIs appear calcified compared to prior study on 03/18/21.  Summary: Right: Resting right ankle-brachial index indicates noncompressible right lower extremity arteries. The right toe-brachial index is abnormal.  Left: Resting left ankle-brachial index indicates noncompressible left lower extremity arteries, however, waveforms are severely dampened. The left toe-brachial index is abnormal.  *See table(s) above for measurements and observations.  Suggest Peripheral Vascular Consult. Electronically signed by Gaile New MD on 05/22/2024 at 8:45:19 PM.    Final            LOS: 0 days   Time spent= 37 mins    Deliliah Room, MD [facility]  If 7PM-7AM, please contact night-coverage  05/23/2024, 9:40 AM

## 2024-05-23 NOTE — Progress Notes (Signed)
 [name] Progress Note   Subjective:    Seen and examined patient at bedside. Denies SOB, CP, and N/V. Noted plan for lower extremity angiography. Will need to schedule today's HD around procedure.  Objective Vitals:   05/23/24 1603 05/23/24 1608 05/23/24 1613 05/23/24 1618  BP: (!) 168/94 (!) 175/82 (!) 170/85 (!) 148/81  Pulse: 70 71 71 70  Resp: 10 14 11 13   Temp:      TempSrc:      SpO2: 100% 100% 100% 96%  Weight:      Height:       Physical Exam General: Alert, awake, NAD Heart: S1 and S2; No murmurs, gallops, or rubs Lungs: Clear throughout Abdomen:Soft and non-tender Extremities: No LE edema Dialysis Access: R AVG   Filed Weights   05/22/24 0844  Weight: 70.3 kg    Intake/Output Summary (Last 24 hours) at 05/23/2024 1629 Last data filed at 05/23/2024 0800 Gross per 24 hour  Intake 0 ml  Output --  Net 0 ml    Additional Objective Labs: Basic Metabolic Panel: Recent Labs  Lab 05/22/24 0845 05/23/24 0407  NA 142 135  K 4.8 5.6*  CL 91* 91*  CO2 26 25  GLUCOSE 112* 95  BUN 54* 69*  CREATININE 9.56* 11.23*  CALCIUM  8.9 8.8*   Liver Function Tests: Recent Labs  Lab 05/22/24 0845 05/23/24 0407  AST 20 16  ALT 12 10  ALKPHOS 111 105  BILITOT 0.8 0.8  PROT 7.8 7.2  ALBUMIN 3.2* 3.1*   No results for input(s): LIPASE, AMYLASE in the last 168 hours. CBC: Recent Labs  Lab 05/22/24 0845 05/23/24 0407  WBC 6.6 6.2  NEUTROABS 4.4  --   HGB 11.3* 10.2*  HCT 35.9* 31.7*  MCV 93.0 90.1  PLT 179 172   Blood Culture    Component Value Date/Time   SDES BLOOD LEFT ANTECUBITAL 11/20/2023 0035   SPECREQUEST AEROBIC BOTTLE ONLY Blood Culture adequate volume 11/20/2023 0035   CULT  11/20/2023 0035    NO GROWTH 5 DAYS Performed at [facility], [address] [place], [place] [address]    REPTSTATUS 11/25/2023 FINAL 11/20/2023 0035    Cardiac Enzymes: No results for input(s): CKTOTAL, CKMB, CKMBINDEX,  TROPONINI in the last 168 hours. CBG: Recent Labs  Lab 05/22/24 1717 05/22/24 2103 05/23/24 0627  GLUCAP 101* 162* 91   Iron Studies: No results for input(s): IRON, TIBC, TRANSFERRIN, FERRITIN in the last 72 hours. Lab Results  Component Value Date   INR 1.1 05/22/2024   INR 1.2 11/20/2023   INR 1.2 09/18/2023   Studies/Results: VAS US  LOWER EXTREMITY VENOUS (DVT) (7a-7p) Result Date: 05/22/2024  Lower Venous DVT Study Patient Name:  [name]  Date of Exam:   05/22/2024 Medical Rec #: [id number]          Accession #:    [id number] Date of Birth: 1950-12-31          Patient Gender: M Patient Age:   73 years Exam Location:  [facility] Procedure:      VAS US  LOWER EXTREMITY VENOUS (DVT) Referring Phys: HAMP FONDAW --------------------------------------------------------------------------------  Indications: Pain. Patient had recent procedure for ingrown left great toe with Podiatry 04/20/24. Now, with ischemic type pain in left lower leg and foot.  Limitations: Shadowing from arterial calcification. Comparison Study: No prior left LEV on file Performing Technologist: Alberta Lis RVS  Examination Guidelines: A complete evaluation includes B-mode imaging, spectral Doppler, color Doppler,  and power Doppler as needed of all accessible portions of each vessel. Bilateral testing is considered an integral part of a complete examination. Limited examinations for reoccurring indications may be performed as noted. The reflux portion of the exam is performed with the patient in reverse Trendelenburg.  +-----+---------------+---------+-----------+----------+--------------+ RIGHTCompressibilityPhasicitySpontaneityPropertiesThrombus Aging +-----+---------------+---------+-----------+----------+--------------+ CFV  Full           Yes      Yes                                 +-----+---------------+---------+-----------+----------+--------------+ SFJ  Full                                                         +-----+---------------+---------+-----------+----------+--------------+   +---------+---------------+---------+-----------+----------+--------------+ LEFT     CompressibilityPhasicitySpontaneityPropertiesThrombus Aging +---------+---------------+---------+-----------+----------+--------------+ CFV      Full           Yes      Yes                                 +---------+---------------+---------+-----------+----------+--------------+ SFJ      Full                                                        +---------+---------------+---------+-----------+----------+--------------+ FV Prox  Full                                                        +---------+---------------+---------+-----------+----------+--------------+ FV Mid   Full                                                        +---------+---------------+---------+-----------+----------+--------------+ FV DistalFull                                                        +---------+---------------+---------+-----------+----------+--------------+ PFV      Full                                                        +---------+---------------+---------+-----------+----------+--------------+ POP      Full           Yes      Yes                                 +---------+---------------+---------+-----------+----------+--------------+ PTV  Full                                                        +---------+---------------+---------+-----------+----------+--------------+ PERO     Full                                                        +---------+---------------+---------+-----------+----------+--------------+ Gastroc  Full                                                        +---------+---------------+---------+-----------+----------+--------------+     Summary: RIGHT: - No evidence of common femoral vein obstruction.  -  Incidentally, heavy calcification noted in the CFA and FA  LEFT: - There is no evidence of deep vein thrombosis in the lower extremity.  - No cystic structure found in the popliteal fossa. - Incidentally, heavy calcification noted throughout the left lower extremity arterial system  *See table(s) above for measurements and observations. Electronically signed by Gaile New MD on 05/22/2024 at 8:46:14 PM.    Final    VAS US  ABI WITH/WO TBI Result Date: 05/22/2024  LOWER EXTREMITY DOPPLER STUDY Patient Name:  [name]  Date of Exam:   05/22/2024 Medical Rec #: [id number]          Accession #:    [id number] Date of Birth: July 18, 1951          Patient Gender: M Patient Age:   70 years Exam Location:  [facility] Procedure:      VAS US  ABI WITH/WO TBI Referring Phys: HAMP FONDAW --------------------------------------------------------------------------------  Indications: Claudication, rest pain, and peripheral artery disease. High Risk Factors: Hypertension, hyperlipidemia, Diabetes, no history of                    smoking. Other Factors: ESRD on dialysis, failed kidney transplant,.  Limitations: Today's exam was limited due to involuntary patient movement and              patient unable to extend leg secondary to rest pain. Comparison Study: Prior ABI done 03/18/21 Performing Technologist: Rachel Pellet RVS  Examination Guidelines: A complete evaluation includes at minimum, Doppler waveform signals and systolic blood pressure reading at the level of bilateral brachial, anterior tibial, and posterior tibial arteries, when vessel segments are accessible. Bilateral testing is considered an integral part of a complete examination. Photoelectric Plethysmograph (PPG) waveforms and toe systolic pressure readings are included as required and additional duplex testing as needed. Limited examinations for reoccurring indications may be performed as noted.  ABI Findings:  +---------+------------------+-----+-----------+-------------------+ Right    Rt Pressure (mmHg)IndexWaveform   Comment             +---------+------------------+-----+-----------+-------------------+ Brachial                                   Restricted/dialysis +---------+------------------+-----+-----------+-------------------+ PTA      254  1.46 multiphasic                    +---------+------------------+-----+-----------+-------------------+ DP       254               1.46 multiphasic                    +---------+------------------+-----+-----------+-------------------+ Great Toe61                0.35 Normal                         +---------+------------------+-----+-----------+-------------------+ +---------+------------------+-----+-------------------+-------+ Left     Lt Pressure (mmHg)IndexWaveform           Comment +---------+------------------+-----+-------------------+-------+ Brachial 174                    triphasic                  +---------+------------------+-----+-------------------+-------+ PTA      254               1.46 dampened monophasic        +---------+------------------+-----+-------------------+-------+ DP       254               1.46 dampened monophasic        +---------+------------------+-----+-------------------+-------+ Great Toe0                 0.00 Absent                     +---------+------------------+-----+-------------------+-------+ +-------+---------------------+-----------+------------+------------+ ABI/TBIToday's ABI          Today's TBIPrevious ABIPrevious TBI +-------+---------------------+-----------+------------+------------+ Right  1.46/non compressible0.35       0.94        0.52         +-------+---------------------+-----------+------------+------------+ Left   1.46/non compressibleabsent     1.06        0.54          +-------+---------------------+-----------+------------+------------+ Arterial wall calcification precludes accurate ankle pressures and ABIs. Bilateral TBIs appear decreased compared to prior study on 03/18/21. Bilateral ABIs appear calcified compared to prior study on 03/18/21.  Summary: Right: Resting right ankle-brachial index indicates noncompressible right lower extremity arteries. The right toe-brachial index is abnormal.  Left: Resting left ankle-brachial index indicates noncompressible left lower extremity arteries, however, waveforms are severely dampened. The left toe-brachial index is abnormal.  *See table(s) above for measurements and observations.  Suggest Peripheral Vascular Consult. Electronically signed by Gaile New MD on 05/22/2024 at 8:45:19 PM.    Final     Medications:  [MAR Hold] anticoagulant sodium citrate       [MAR Hold] amLODipine   10 mg Oral QHS   [MAR Hold] atorvastatin  10 mg Oral Daily   [MAR Hold] carvedilol   25 mg Oral BID WC   [MAR Hold] Chlorhexidine  Gluconate Cloth  6 each Topical Q0600   [MAR Hold] cinacalcet   90 mg Oral Q M,W,F-HD   [MAR Hold] doxercalciferol   6 mcg Intravenous Q M,W,F-HD   [MAR Hold] heparin   5,000 Units Subcutaneous Q8H   [MAR Hold] insulin  aspart  0-6 Units Subcutaneous TID WC   [MAR Hold] sodium chloride  flush  3 mL Intravenous Q12H    Dialysis Orders: [facility] MWF EDW 68.1 kg 4 hrsF180 BFR 400 mL/ min AVF Profile 2  15 g needles Mircera 50 q 2 weeks Hectorol  6 q rx Sensipar  90 three times per week  Sevelamer  4 tabs TID with meals Kayexelate 3x/ weekly  Assessment/Plan: 1 L critical limb ischemia: seen by VVS.  Plan for angio today (10/13) 2 ESRD: MWF at [facility]- will do HD on schedule sometime today, working around angio schedule 3 Hypertension: reasonably well controlled on amlodipine  and carvedilol , UF as tolerated 4. 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 Metabolic Bone Disease: sevelamer  as binder, sensipar  with HD as well  as hectorol  6.  DM II: A1c pending, per primary 7.  HLD: to resume statin 8.  Dispo: inpatient  Charmaine Piety, NP [facility] 05/23/2024,4:29 PM  LOS: 0 days

## 2024-05-23 NOTE — Progress Notes (Signed)
    Subjective  -   Still with left leg pain   Physical Exam:  No changes in exam Palpable femoral pulses       Assessment/Plan:    Left leg rest pain/early Achilles ulcer: Plan for angio later today.  All questions answered  Wells Stanisha Lorenz 05/23/2024 8:12 AM --  Vitals:   05/23/24 0002 05/23/24 0333  BP: (!) 151/80 125/68  Pulse: 77 79  Resp: 18 18  Temp: 98 F (36.7 C) 97.9 F (36.6 C)  SpO2: 97% 96%    Intake/Output Summary (Last 24 hours) at 05/23/2024 0812 Last data filed at 05/22/2024 1600 Gross per 24 hour  Intake 240 ml  Output --  Net 240 ml     Laboratory CBC    Component Value Date/Time   WBC 6.2 05/23/2024 0407   HGB 10.2 (L) 05/23/2024 0407   HCT 31.7 (L) 05/23/2024 0407   PLT 172 05/23/2024 0407    BMET    Component Value Date/Time   NA 135 05/23/2024 0407   NA 135 (A) 04/03/2020 0000   K 5.6 (H) 05/23/2024 0407   CL 91 (L) 05/23/2024 0407   CO2 25 05/23/2024 0407   GLUCOSE 95 05/23/2024 0407   BUN 69 (H) 05/23/2024 0407   BUN 53 (A) 03/29/2020 0000   CREATININE 11.23 (H) 05/23/2024 0407   CALCIUM  8.8 (L) 05/23/2024 0407   CALCIUM  7.0 (L) 09/08/2010 1950   GFRNONAA 4 (L) 05/23/2024 0407   GFRAA 13 (L) 04/18/2020 1855    COAG Lab Results  Component Value Date   INR 1.1 05/22/2024   INR 1.2 11/20/2023   INR 1.2 09/18/2023   No results found for: PTT  Antibiotics Anti-infectives (From admission, onward)    None        V. Malvina Serene CLORE, M.D., [facility] [place] Office: [phone] Pager:  [phone]

## 2024-05-23 NOTE — Progress Notes (Signed)
 Patient back to 4E from cath lab. Dialysis notified.   05/23/24 1637  Vitals  Temp (!) 97.4 F (36.3 C)  Temp Source Oral  BP (!) 140/80  MAP (mmHg) 99  BP Location Left Arm  BP Method Automatic  Patient Position (if appropriate) Lying  Pulse Rate 65  Pulse Rate Source Monitor  ECG Heart Rate 67  Resp 14  MEWS COLOR  MEWS Score Color Green  Oxygen Therapy  SpO2 100 %  O2 Device Room Air  MEWS Score  MEWS Temp 0  MEWS Systolic 0  MEWS Pulse 0  MEWS RR 0  MEWS LOC 1  MEWS Score 1

## 2024-05-23 NOTE — Progress Notes (Signed)
 Pt receives out-pt HD at [facility] on MWF 11:40 am chair time. Will assist as needed.   Randine Mungo Dialysis Navigator [phone]

## 2024-05-23 NOTE — Op Note (Signed)
 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 Sheree, MD Procedure Performed: 1.  Percutaneous ultrasound-guided cannulation and Mynx device closure right common femoral artery 2.  Catheter selection of aorta and aortogram with bilateral lower extremity angiography 3.  Catheter selection of left SFA 4.  Shockwave lithotripsy left SFA with 5 x 80 mm balloon for 400 pulses followed by stenting of left SFA with 6 x 150 mm Eluvia postdilated with 5 mm balloon 5.  Ultrasound-guided cannulation left posterior tibial artery 6.  Plain balloon angioplasty left posterior tibial artery with 3 x 150 mm balloon 7.  Moderate sedation with fentanyl  and Versed  for 99 minutes  Indications: 73 year old male with severe left lower extremity pain with monophasic ABIs on the left and toe pressure of 0.  He is indicated for angiography with possible invention.  Findings: Aorta and iliac segments are free of flow-limiting stenosis however the hypogastric artery on the left is occluded.  Renal arteries are occluded consistent with need for dialysis.  Bilateral common femoral arteries appear calcified however there is brisk flow with palpable pulses that are strong.  The left lower extremity is the site of interest.  Flow is favorable through the profunda and sluggish into the SFA and the left SFA was then selected.  Above the adductor the SFA frankly occludes it was very difficult to determine reconstitution however the distal posterior tibial definitively reconstitutes and fills the medial and lateral plantar vessels.  We are unable to cross the heavily calcified lesion from above ultimately were able to cross retrograde and snared a through and through wire and perform shockwave lithotripsy followed by primary  stenting with drug-eluting stent.  We performed balloon angioplasty of the posterior tibial artery for multiple areas of stenosis that were greater than 60% and these were reduced to 0% and at completion there was a systolic pressure of 160 mmHg at the ankle via the pedal sheath and strong signals after the pedal sheath was removed.   Procedure:  The patient was identified in the holding area and taken to room 8.  The patient was then placed supine on the table and prepped and draped in the usual sterile fashion.  A time out was called.  Ultrasound was used to evaluate the right common femoral artery.  There was circumferential calcium  however no flow-limiting stenosis and a strong pulse in the area was anesthetized and cannulated the right common femoral artery with micropuncture needle followed by wire and a sheath using direct ultrasound visualization and ultrasound images saved to the permanent record.  Concomitantly we administered fentanyl  and Versed  as moderate sedation and his vital signs were monitored by bedside nursing throughout the case.  We placed a Bentson wire followed by a 5 French sheath to the level of L1 and aortogram was performed followed by bilateral pelvic angiography including the bilateral common femoral arteries.  We then crossed the bifurcation and performed angiography of the left lower extremity initially from selection of the left common femoral artery and then moved this into the left SFA given the brisk flow into the left profunda.  With the above findings we then placed the Glidewire advantage and a long 6 French sheath was placed into the left SFA and the patient was fully heparinized.  From above we attempted to cross the SFA occlusion and I  was unable to cross this even using the back of a Glidewire given the dense calcifications.  The left medial ankle was then prepped and draped and I used ultrasound to cannulate the left posterior tibial artery and placed a micropuncture  wire followed by sheath.  We perform retrograde angiography which demonstrated brisk flow retrograde however disease in the posterior tibial artery.  Using a V18 wire and a CXI catheter we are then able to get up to the area of occlusion of the SFA and using the back of the V18 wire we are able to get true lumen only into the SFA and snared an 014 wire through and through.  We then primarily ballooned using shockwave lithotripsy for a total of 400 pulses throughout the occluded SFA segment and completion angiography demonstrated improvement in flow however there remained calcification narrowing primarily stented with a 6 x 150 mm drug-eluting stent postdilated with a 5 mm balloon.  At this time flow was brisk through the stent and there was a ankle pressure of 80 mmHg.  There was approximately 10% in-stent stenosis but with brisk flow we elected no further intervention.  We then performed angiography below the knee and demonstrated the posterior tibial artery stenoses and these were ballooned to 0% residual stenosis with brisk flow and now an ankle pressure of 160 mmHg through the pedal sheath.  Satisfied with this we removed the through and through wire and then placed a Bentson wire and exchanged for 6 French sheath and deployed a minx device.  The patient tolerated the procedure without immediate complication.    Contrast: 100cc  Akiyah Eppolito C. Sheree, MD [facility] [place] Office: [phone] Pager: [phone]

## 2024-05-23 NOTE — Interval H&P Note (Signed)
 History and Physical Interval Note:  05/23/2024 12:25 PM  [name]  has presented today for surgery, with the diagnosis of claudicaiton.  The various methods of treatment have been discussed with the patient and family. After consideration of risks, benefits and other options for treatment, the patient has consented to  Procedure(s): Lower Extremity Angiography (N/A) as a surgical intervention.  The patient's history has been reviewed, patient examined, no change in status, stable for surgery.  I have reviewed the patient's chart and labs.  Questions were answered to the patient's satisfaction.     [name]

## 2024-05-24 ENCOUNTER — Encounter (HOSPITAL_COMMUNITY): Payer: Self-pay | Admitting: Vascular Surgery

## 2024-05-24 DIAGNOSIS — Z95820 Peripheral vascular angioplasty status with implants and grafts: Secondary | ICD-10-CM | POA: Diagnosis not present

## 2024-05-24 DIAGNOSIS — M79672 Pain in left foot: Secondary | ICD-10-CM | POA: Diagnosis not present

## 2024-05-24 DIAGNOSIS — L97329 Non-pressure chronic ulcer of left ankle with unspecified severity: Secondary | ICD-10-CM | POA: Diagnosis not present

## 2024-05-24 DIAGNOSIS — Z48812 Encounter for surgical aftercare following surgery on the circulatory system: Secondary | ICD-10-CM | POA: Diagnosis not present

## 2024-05-24 LAB — LIPID PANEL
Cholesterol: 201 mg/dL — ABNORMAL HIGH (ref 0–200)
HDL: 55 mg/dL (ref >40)
LDL Cholesterol: 125 mg/dL — ABNORMAL HIGH (ref 0–99)
Total CHOL/HDL Ratio: 3.7 ratio
Triglycerides: 103 mg/dL (ref <150)
VLDL: 21 mg/dL (ref 0–40)

## 2024-05-24 LAB — CBC
HCT: 33.7 % — ABNORMAL LOW (ref 39.0–52.0)
Hemoglobin: 10.9 g/dL — ABNORMAL LOW (ref 13.0–17.0)
MCH: 29.5 pg (ref 26.0–34.0)
MCHC: 32.3 g/dL (ref 30.0–36.0)
MCV: 91.3 fL (ref 80.0–100.0)
Platelets: 192 K/uL (ref 150–400)
RBC: 3.69 MIL/uL — ABNORMAL LOW (ref 4.22–5.81)
RDW: 13.9 % (ref 11.5–15.5)
WBC: 5.7 K/uL (ref 4.0–10.5)
nRBC: 0 % (ref 0.0–0.2)

## 2024-05-24 LAB — BASIC METABOLIC PANEL WITH GFR
Anion gap: 18 — ABNORMAL HIGH (ref 5–15)
BUN: 41 mg/dL — ABNORMAL HIGH (ref 8–23)
CO2: 22 mmol/L (ref 22–32)
Calcium: 8.9 mg/dL (ref 8.9–10.3)
Chloride: 93 mmol/L — ABNORMAL LOW (ref 98–111)
Creatinine, Ser: 7.89 mg/dL — ABNORMAL HIGH (ref 0.61–1.24)
GFR, Estimated: 7 mL/min — ABNORMAL LOW (ref >60)
Glucose, Bld: 86 mg/dL (ref 70–99)
Potassium: 4.8 mmol/L (ref 3.5–5.1)
Sodium: 133 mmol/L — ABNORMAL LOW (ref 135–145)

## 2024-05-24 LAB — GLUCOSE, CAPILLARY
Glucose-Capillary: 117 mg/dL — ABNORMAL HIGH (ref 70–99)
Glucose-Capillary: 120 mg/dL — ABNORMAL HIGH (ref 70–99)
Glucose-Capillary: 143 mg/dL — ABNORMAL HIGH (ref 70–99)
Glucose-Capillary: 90 mg/dL (ref 70–99)
Glucose-Capillary: 92 mg/dL (ref 70–99)

## 2024-05-24 LAB — HEPATITIS B SURFACE ANTIBODY, QUANTITATIVE: Hep B S AB Quant (Post): 59.1 m[IU]/mL

## 2024-05-24 MED ORDER — CHLORHEXIDINE GLUCONATE CLOTH 2 % EX PADS
6.0000 | MEDICATED_PAD | Freq: Every day | CUTANEOUS | Status: DC
  Administered 2024-05-25: 6 via TOPICAL

## 2024-05-24 NOTE — Progress Notes (Signed)
 PROGRESS NOTE    [name]  FMW:[id number] DOB: [birth year] DOA: 05/22/2024 PCP: Joshua [name] CROME, MD   Brief Narrative:   73 y.o. male with medical history significant of hypertension, hyperlipidemia, diabetes, PAD, seizure, ESRD on HD, anemia, diastolic CHF presenting with progressive left leg pain which has been going on for 3 to 4 weeks now.  Testing negative for DVT.  ABI showed noncompressible lower extremity vessels bilaterally.  Vascular surgery consulted. s/p Aortogram arteriogram of LLE with shockwave lithotripsy, angioplasty and stenting of left SFA, balloon angioplasty of left PT done on 10/13. PT consulted.  Assessment & Plan:  Principal Problem:   Ischemic ulcer of ankle (HCC) Active Problems:   ESRD on dialysis [facility])   Essential hypertension   Seizure disorder (HCC)   History of anemia due to chronic kidney disease   Dyslipidemia, goal LDL below 100   PAD (peripheral artery disease)   Chronic diastolic CHF (congestive heart failure) (HCC)   DM2 (diabetes mellitus, type 2) (HCC)   Critical limb ischemia of left lower extremity (HCC)    Left limb ischemia Early Achilles ischemic ulcer PAD, bilateral  s/p Aortogram arteriogram of LLE with shockwave lithotripsy, angioplasty and stenting of left SFA, balloon angioplasty of left PT done on 10/13 with good results.  -Lower extremity venous Doppler showed no evidence of DVT --ABI performed and showed noncompressible vessels bilaterally. -Vascular surgery on board - As needed analgesics -Continue with DAPT and statin.   ESRD on HD -Dialyzes MWF.   History of prior renal transplant. - Nephrology consulted  - HD as per schedule   Chronic diastolic CHF, POA: OSA class III - Last echo was in February which showed EF 45-50%, indeterminate diastolic function, normal RV function. - Volume managed with dialysis - Continue Coreg    Hypertension - Continue amlodipine  and Coreg    Hyperlipidemia - Continue with  statin   Diabetes type II - SSI   History of seizure: No acute issues  Disposition: Patient lives at home with his wife.  DVT prophylaxis: heparin  injection 5,000 Units Start: 05/23/24 2200     Code Status: Full Code Family Communication: None at the bedside Status is: Inpatient Remains inpatient appropriate because: Left lower limb ischemia, s/p angioplasty   Subjective:  Left leg pain has improved. He said that he hasn't stepped feet on the ground yet so he doesn't feel comfortable going home today. I spoke to him about getting PT on board.  Examination:  General exam: Appears calm and comfortable  Respiratory system: Clear to auscultation. Respiratory effort normal. Cardiovascular system: S1 & S2 heard, RRR. No JVD, murmurs, rubs, gallops or clicks. No pedal edema. Gastrointestinal system: Abdomen is nondistended, soft and nontender. No organomegaly or masses felt. Normal bowel sounds heard. Central nervous system: Alert and oriented. No focal neurological deficits. Extremities: warm left foot and leg today. Skin: No rashes, lesions or ulcers Psychiatry: Judgement and insight appear normal. Mood & affect appropriate.     Diet Orders (From admission, onward)     Start     Ordered   05/24/24 0908  Diet renal/carb modified with fluid restriction Diet-HS Snack? Nothing; Fluid restriction: 1200 mL Fluid; Room service appropriate? Yes with Assist; Fluid consistency: Thin  Diet effective now       Question Answer Comment  Diet-HS Snack? Nothing   Fluid restriction: 1200 mL Fluid   Room service appropriate? Yes with Assist   Fluid consistency: Thin      05/24/24 0908  Objective: Vitals:   05/23/24 2345 05/24/24 0047 05/24/24 0646 05/24/24 0802  BP: 119/70 128/70 (!) 122/59 132/76  Pulse: 68 69 68 76  Resp: 16 19 16 15   Temp: (!) 97.5 F (36.4 C) 98.1 F (36.7 C) 98.2 F (36.8 C) 98.2 F (36.8 C)  TempSrc: Oral Oral Oral Oral  SpO2: 99% 100% 97%  98%  Weight: 69.8 kg     Height:        Intake/Output Summary (Last 24 hours) at 05/24/2024 1051 Last data filed at 05/24/2024 0907 Gross per 24 hour  Intake 240 ml  Output 2000 ml  Net -1760 ml   Filed Weights   05/22/24 0844 05/23/24 1857 05/23/24 2345  Weight: 70.3 kg 71.8 kg 69.8 kg    Scheduled Meds:  amLODipine   10 mg Oral QHS   aspirin  EC  81 mg Oral Daily   atorvastatin  10 mg Oral Daily   carvedilol   25 mg Oral BID WC   cinacalcet   90 mg Oral Q M,W,F-HD   clopidogrel  75 mg Oral Q breakfast   doxercalciferol   6 mcg Intravenous Q M,W,F-HD   heparin   5,000 Units Subcutaneous Q8H   insulin  aspart  0-6 Units Subcutaneous TID WC   sodium chloride  flush  3 mL Intravenous Q12H   sodium chloride  flush  3 mL Intravenous Q12H   Continuous Infusions:  sodium chloride      anticoagulant sodium citrate       Nutritional status     Body mass index is 22.08 kg/m.  Data Reviewed:   CBC: Recent Labs  Lab 05/22/24 0845 05/23/24 0407 05/24/24 0300  WBC 6.6 6.2 5.7  NEUTROABS 4.4  --   --   HGB 11.3* 10.2* 10.9*  HCT 35.9* 31.7* 33.7*  MCV 93.0 90.1 91.3  PLT 179 172 192   Basic Metabolic Panel: Recent Labs  Lab 05/22/24 0845 05/23/24 0407 05/24/24 0300  NA 142 135 133*  K 4.8 5.6* 4.8  CL 91* 91* 93*  CO2 26 25 22   GLUCOSE 112* 95 86  BUN 54* 69* 41*  CREATININE 9.56* 11.23* 7.89*  CALCIUM  8.9 8.8* 8.9  MG 2.6*  --   --    GFR: Estimated Creatinine Clearance: 8.5 mL/min (A) (by C-G formula based on SCr of 7.89 mg/dL (H)). Liver Function Tests: Recent Labs  Lab 05/22/24 0845 05/23/24 0407  AST 20 16  ALT 12 10  ALKPHOS 111 105  BILITOT 0.8 0.8  PROT 7.8 7.2  ALBUMIN 3.2* 3.1*   No results for input(s): LIPASE, AMYLASE in the last 168 hours. No results for input(s): AMMONIA in the last 168 hours. Coagulation Profile: Recent Labs  Lab 05/22/24 0845  INR 1.1   Cardiac Enzymes: No results for input(s): CKTOTAL, CKMB, CKMBINDEX,  TROPONINI in the last 168 hours. BNP (last 3 results) Recent Labs    03/22/24 1344  PROBNP 391.0*   HbA1C: Recent Labs    05/23/24 0407  HGBA1C 4.7*   CBG: Recent Labs  Lab 05/22/24 2103 05/23/24 0627 05/23/24 1709 05/24/24 0051 05/24/24 0610  GLUCAP 162* 91 99 117* 90   Lipid Profile: Recent Labs    05/24/24 0259  CHOL 201*  HDL 55  LDLCALC 125*  TRIG 103  CHOLHDL 3.7   Thyroid  Function Tests: No results for input(s): TSH, T4TOTAL, FREET4, T3FREE, THYROIDAB in the last 72 hours. Anemia Panel: No results for input(s): VITAMINB12, FOLATE, FERRITIN, TIBC, IRON, RETICCTPCT in the last 72 hours. Sepsis Labs: Recent Labs  Lab 05/22/24 0901 05/22/24 1159  LATICACIDVEN 1.2 1.7    No results found for this or any previous visit (from the past 240 hours).       Radiology Studies: PERIPHERAL VASCULAR CATHETERIZATION Result Date: 05/23/2024 Patient name: [name] MRN: [id number] DOB: [birth year] Sex: male 05/23/2024 Pre-operative Diagnosis: 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 Sheree, MD Procedure Performed: 1.  Percutaneous ultrasound-guided cannulation and Mynx device closure right common femoral artery 2.  Catheter selection of aorta and aortogram with bilateral lower extremity angiography 3.  Catheter selection of left SFA 4.  Shockwave lithotripsy left SFA with 5 x 80 mm balloon for 400 pulses followed by stenting of left SFA with 6 x 150 mm Eluvia postdilated with 5 mm balloon 5.  Ultrasound-guided cannulation left posterior tibial artery 6.  Plain balloon angioplasty left posterior tibial artery with 3 x 150 mm balloon 7.  Moderate sedation with fentanyl  and Versed  for 99 minutes Indications: 73 year old male with severe left lower extremity pain with monophasic ABIs on the left and toe pressure of 0.  He is indicated for  angiography with possible invention. Findings: Aorta and iliac segments are free of flow-limiting stenosis however the hypogastric artery on the left is occluded.  Renal arteries are occluded consistent with need for dialysis.  Bilateral common femoral arteries appear calcified however there is brisk flow with palpable pulses that are strong.  The left lower extremity is the site of interest.  Flow is favorable through the profunda and sluggish into the SFA and the left SFA was then selected.  Above the adductor the SFA frankly occludes it was very difficult to determine reconstitution however the distal posterior tibial definitively reconstitutes and fills the medial and lateral plantar vessels.  We are unable to cross the heavily calcified lesion from above ultimately were able to cross retrograde and snared a through and through wire and perform shockwave lithotripsy followed by primary stenting with drug-eluting stent.  We performed balloon angioplasty of the posterior tibial artery for multiple areas of stenosis that were greater than 60% and these were reduced to 0% and at completion there was a systolic pressure of 160 mmHg at the ankle via the pedal sheath and strong signals after the pedal sheath was removed.  Procedure:  The patient was identified in the holding area and taken to room 8.  The patient was then placed supine on the table and prepped and draped in the usual sterile fashion.  A time out was called.  Ultrasound was used to evaluate the right common femoral artery.  There was circumferential calcium  however no flow-limiting stenosis and a strong pulse in the area was anesthetized and cannulated the right common femoral artery with micropuncture needle followed by wire and a sheath using direct ultrasound visualization and ultrasound images saved to the permanent record.  Concomitantly we administered fentanyl  and Versed  as moderate sedation and his vital signs were monitored by bedside nursing  throughout the case.  We placed a Bentson wire followed by a 5 French sheath to the level of L1 and aortogram was performed followed by bilateral pelvic angiography including the bilateral common femoral arteries.  We then crossed the bifurcation and performed angiography of the left lower extremity initially from selection of the left common femoral artery and then moved this into the left SFA given the brisk flow into the left profunda.  With the above findings we  then placed the Glidewire advantage and a long 6 French sheath was placed into the left SFA and the patient was fully heparinized.  From above we attempted to cross the SFA occlusion and I was unable to cross this even using the back of a Glidewire given the dense calcifications.  The left medial ankle was then prepped and draped and I used ultrasound to cannulate the left posterior tibial artery and placed a micropuncture wire followed by sheath.  We perform retrograde angiography which demonstrated brisk flow retrograde however disease in the posterior tibial artery.  Using a V18 wire and a CXI catheter we are then able to get up to the area of occlusion of the SFA and using the back of the V18 wire we are able to get true lumen only into the SFA and snared an 014 wire through and through.  We then primarily ballooned using shockwave lithotripsy for a total of 400 pulses throughout the occluded SFA segment and completion angiography demonstrated improvement in flow however there remained calcification narrowing primarily stented with a 6 x 150 mm drug-eluting stent postdilated with a 5 mm balloon.  At this time flow was brisk through the stent and there was a ankle pressure of 80 mmHg.  There was approximately 10% in-stent stenosis but with brisk flow we elected no further intervention.  We then performed angiography below the knee and demonstrated the posterior tibial artery stenoses and these were ballooned to 0% residual stenosis with brisk flow and  now an ankle pressure of 160 mmHg through the pedal sheath.  Satisfied with this we removed the through and through wire and then placed a Bentson wire and exchanged for 6 French sheath and deployed a minx device.  The patient tolerated the procedure without immediate complication.  Contrast: 100cc Brandon C. Sheree, MD [facility] [place] Office: [phone] Pager: [phone]   VAS US  LOWER EXTREMITY VENOUS (DVT) (7a-7p) Result Date: 05/22/2024  Lower Venous DVT Study Patient Name:  [name]  Date of Exam:   05/22/2024 Medical Rec #: [id number]          Accession #:    [id number] Date of Birth: 1951/05/14          Patient Gender: M Patient Age:   42 years Exam Location:  [facility] Procedure:      VAS US  LOWER EXTREMITY VENOUS (DVT) Referring Phys: HAMP FONDAW --------------------------------------------------------------------------------  Indications: Pain. Patient had recent procedure for ingrown left great toe with Podiatry 04/20/24. Now, with ischemic type pain in left lower leg and foot.  Limitations: Shadowing from arterial calcification. Comparison Study: No prior left LEV on file Performing Technologist: Alberta Lis RVS  Examination Guidelines: A complete evaluation includes B-mode imaging, spectral Doppler, color Doppler, and power Doppler as needed of all accessible portions of each vessel. Bilateral testing is considered an integral part of a complete examination. Limited examinations for reoccurring indications may be performed as noted. The reflux portion of the exam is performed with the patient in reverse Trendelenburg.  +-----+---------------+---------+-----------+----------+--------------+ RIGHTCompressibilityPhasicitySpontaneityPropertiesThrombus Aging +-----+---------------+---------+-----------+----------+--------------+ CFV  Full           Yes      Yes                                  +-----+---------------+---------+-----------+----------+--------------+ SFJ  Full                                                        +-----+---------------+---------+-----------+----------+--------------+   +---------+---------------+---------+-----------+----------+--------------+  LEFT     CompressibilityPhasicitySpontaneityPropertiesThrombus Aging +---------+---------------+---------+-----------+----------+--------------+ CFV      Full           Yes      Yes                                 +---------+---------------+---------+-----------+----------+--------------+ SFJ      Full                                                        +---------+---------------+---------+-----------+----------+--------------+ FV Prox  Full                                                        +---------+---------------+---------+-----------+----------+--------------+ FV Mid   Full                                                        +---------+---------------+---------+-----------+----------+--------------+ FV DistalFull                                                        +---------+---------------+---------+-----------+----------+--------------+ PFV      Full                                                        +---------+---------------+---------+-----------+----------+--------------+ POP      Full           Yes      Yes                                 +---------+---------------+---------+-----------+----------+--------------+ PTV      Full                                                        +---------+---------------+---------+-----------+----------+--------------+ PERO     Full                                                        +---------+---------------+---------+-----------+----------+--------------+ Gastroc  Full                                                         +---------+---------------+---------+-----------+----------+--------------+  Summary: RIGHT: - No evidence of common femoral vein obstruction.  - Incidentally, heavy calcification noted in the CFA and FA  LEFT: - There is no evidence of deep vein thrombosis in the lower extremity.  - No cystic structure found in the popliteal fossa. - Incidentally, heavy calcification noted throughout the left lower extremity arterial system  *See table(s) above for measurements and observations. Electronically signed by Gaile New MD on 05/22/2024 at 8:46:14 PM.    Final    VAS US  ABI WITH/WO TBI Result Date: 05/22/2024  LOWER EXTREMITY DOPPLER STUDY Patient Name:  [name]  Date of Exam:   05/22/2024 Medical Rec #: [id number]          Accession #:    [id number] Date of Birth: Nov 25, 1950          Patient Gender: M Patient Age:   85 years Exam Location:  [facility] Procedure:      VAS US  ABI WITH/WO TBI Referring Phys: HAMP FONDAW --------------------------------------------------------------------------------  Indications: Claudication, rest pain, and peripheral artery disease. High Risk Factors: Hypertension, hyperlipidemia, Diabetes, no history of                    smoking. Other Factors: ESRD on dialysis, failed kidney transplant,.  Limitations: Today's exam was limited due to involuntary patient movement and              patient unable to extend leg secondary to rest pain. Comparison Study: Prior ABI done 03/18/21 Performing Technologist: Rachel Pellet RVS  Examination Guidelines: A complete evaluation includes at minimum, Doppler waveform signals and systolic blood pressure reading at the level of bilateral brachial, anterior tibial, and posterior tibial arteries, when vessel segments are accessible. Bilateral testing is considered an integral part of a complete examination. Photoelectric Plethysmograph (PPG) waveforms and toe systolic pressure readings are included as required and additional  duplex testing as needed. Limited examinations for reoccurring indications may be performed as noted.  ABI Findings: +---------+------------------+-----+-----------+-------------------+ Right    Rt Pressure (mmHg)IndexWaveform   Comment             +---------+------------------+-----+-----------+-------------------+ Brachial                                   Restricted/dialysis +---------+------------------+-----+-----------+-------------------+ PTA      254               1.46 multiphasic                    +---------+------------------+-----+-----------+-------------------+ DP       254               1.46 multiphasic                    +---------+------------------+-----+-----------+-------------------+ Great Toe61                0.35 Normal                         +---------+------------------+-----+-----------+-------------------+ +---------+------------------+-----+-------------------+-------+ Left     Lt Pressure (mmHg)IndexWaveform           Comment +---------+------------------+-----+-------------------+-------+ Brachial 174                    triphasic                  +---------+------------------+-----+-------------------+-------+ PTA  254               1.46 dampened monophasic        +---------+------------------+-----+-------------------+-------+ DP       254               1.46 dampened monophasic        +---------+------------------+-----+-------------------+-------+ Great Toe0                 0.00 Absent                     +---------+------------------+-----+-------------------+-------+ +-------+---------------------+-----------+------------+------------+ ABI/TBIToday's ABI          Today's TBIPrevious ABIPrevious TBI +-------+---------------------+-----------+------------+------------+ Right  1.46/non compressible0.35       0.94        0.52         +-------+---------------------+-----------+------------+------------+ Left    1.46/non compressibleabsent     1.06        0.54         +-------+---------------------+-----------+------------+------------+ Arterial wall calcification precludes accurate ankle pressures and ABIs. Bilateral TBIs appear decreased compared to prior study on 03/18/21. Bilateral ABIs appear calcified compared to prior study on 03/18/21.  Summary: Right: Resting right ankle-brachial index indicates noncompressible right lower extremity arteries. The right toe-brachial index is abnormal.  Left: Resting left ankle-brachial index indicates noncompressible left lower extremity arteries, however, waveforms are severely dampened. The left toe-brachial index is abnormal.  *See table(s) above for measurements and observations.  Suggest Peripheral Vascular Consult. Electronically signed by Gaile New MD on 05/22/2024 at 8:45:19 PM.    Final            LOS: 1 day   Time spent= 37 mins    Deliliah Room, MD [facility]  If 7PM-7AM, please contact night-coverage  05/24/2024, 10:51 AM

## 2024-05-24 NOTE — Progress Notes (Addendum)
  Progress Note    05/24/2024 7:36 AM 1 Day Post-Op  Subjective:  says left foot still painful, less throbbing but now more constant   Vitals:   05/24/24 0047 05/24/24 0646  BP: 128/70 (!) 122/59  Pulse: 69 68  Resp: 19 16  Temp: 98.1 F (36.7 C) 98.2 F (36.8 C)  SpO2: 100% 97%   Physical Exam: Cardiac:  regular Lungs:  non labored Incisions:  right cf access site without swelling or hematoma Extremities:  LLE well perfused and warm with brisk PT/Pero doppler signals Abdomen:  soft Neurologic: alert and oriented   CBC    Component Value Date/Time   WBC 5.7 05/24/2024 0300   RBC 3.69 (L) 05/24/2024 0300   HGB 10.9 (L) 05/24/2024 0300   HCT 33.7 (L) 05/24/2024 0300   PLT 192 05/24/2024 0300   MCV 91.3 05/24/2024 0300   MCH 29.5 05/24/2024 0300   MCHC 32.3 05/24/2024 0300   RDW 13.9 05/24/2024 0300   LYMPHSABS 1.5 05/22/2024 0845   MONOABS 0.7 05/22/2024 0845   EOSABS 0.1 05/22/2024 0845   BASOSABS 0.0 05/22/2024 0845    BMET    Component Value Date/Time   NA 133 (L) 05/24/2024 0300   NA 135 (A) 04/03/2020 0000   K 4.8 05/24/2024 0300   CL 93 (L) 05/24/2024 0300   CO2 22 05/24/2024 0300   GLUCOSE 86 05/24/2024 0300   BUN 41 (H) 05/24/2024 0300   BUN 53 (A) 03/29/2020 0000   CREATININE 7.89 (H) 05/24/2024 0300   CALCIUM  8.9 05/24/2024 0300   CALCIUM  7.0 (L) 09/08/2010 1950   GFRNONAA 7 (L) 05/24/2024 0300   GFRAA 13 (L) 04/18/2020 1855    INR    Component Value Date/Time   INR 1.1 05/22/2024 0845     Intake/Output Summary (Last 24 hours) at 05/24/2024 0736 Last data filed at 05/23/2024 2345 Gross per 24 hour  Intake 0 ml  Output 2000 ml  Net -2000 ml     Assessment/Plan:  73 y.o. male is s/p Aortogram arteriogram of LLE with shockwave lithotripsy, angioplasty and stenting of left SFA, balloon angioplasty of left PT 1 Day Post-Op   LLE well perfused and warm with Doppler DP/Pero signals Right CF access site without swelling or  hematoma Continue Aspirin , Plavix, Statin Will arrange outpatient follow up in 1 month with ABI and LLE arterial duplex   Teretha Damme, PA-C [facility] [phone] 05/24/2024 7:36 AM  I have independently interviewed and examined patient and agree with PA assessment and plan above.  Is normal well-perfused he continues to have pain.  He is optimized and is okay for discharge from a vascular standpoint.   Jenika Chiem C. Sheree, MD [facility] [place] Office: [phone] Pager: [phone]

## 2024-05-24 NOTE — Progress Notes (Signed)
 [facility] Progress Note   Subjective:    Seen and examined patient at bedside. S/p aortogram arteriogram of LLE with shockwave lithotripsy, angioplasty and stenting of left SFA, balloon angioplasty of left lower extremity by VVS yesterday.  Noted PT has been consulted. Tolerated yesterday's HD overnight with net UF 2L. Next hD 10/15.  Objective Vitals:   05/24/24 0047 05/24/24 0646 05/24/24 0802 05/24/24 1201  BP: 128/70 (!) 122/59 132/76 122/72  Pulse: 69 68 76 98  Resp: 19 16 15 20   Temp: 98.1 F (36.7 C) 98.2 F (36.8 C) 98.2 F (36.8 C) 98.1 F (36.7 C)  TempSrc: Oral Oral Oral Oral  SpO2: 100% 97% 98%   Weight:      Height:       Physical Exam General: Alert, awake, NAD Heart: S1 and S2; No murmurs, gallops, or rubs Lungs: Clear throughout Abdomen:Soft and non-tender Extremities: No LE edema Dialysis Access: R AVG   Filed Weights   05/22/24 0844 05/23/24 1857 05/23/24 2345  Weight: 70.3 kg 71.8 kg 69.8 kg    Intake/Output Summary (Last 24 hours) at 05/24/2024 1523 Last data filed at 05/24/2024 1300 Gross per 24 hour  Intake 480 ml  Output 2000 ml  Net -1520 ml    Additional Objective Labs: Basic Metabolic Panel: Recent Labs  Lab 05/22/24 0845 05/23/24 0407 05/24/24 0300  NA 142 135 133*  K 4.8 5.6* 4.8  CL 91* 91* 93*  CO2 26 25 22   GLUCOSE 112* 95 86  BUN 54* 69* 41*  CREATININE 9.56* 11.23* 7.89*  CALCIUM  8.9 8.8* 8.9   Liver Function Tests: Recent Labs  Lab 05/22/24 0845 05/23/24 0407  AST 20 16  ALT 12 10  ALKPHOS 111 105  BILITOT 0.8 0.8  PROT 7.8 7.2  ALBUMIN 3.2* 3.1*   No results for input(s): LIPASE, AMYLASE in the last 168 hours. CBC: Recent Labs  Lab 05/22/24 0845 05/23/24 0407 05/24/24 0300  WBC 6.6 6.2 5.7  NEUTROABS 4.4  --   --   HGB 11.3* 10.2* 10.9*  HCT 35.9* 31.7* 33.7*  MCV 93.0 90.1 91.3  PLT 179 172 192   Blood Culture    Component Value Date/Time   SDES BLOOD LEFT ANTECUBITAL  11/20/2023 0035   SPECREQUEST AEROBIC BOTTLE ONLY Blood Culture adequate volume 11/20/2023 0035   CULT  11/20/2023 0035    NO GROWTH 5 DAYS Performed at [facility], [address], [place], [place] [address]    REPTSTATUS 11/25/2023 FINAL 11/20/2023 0035    Cardiac Enzymes: No results for input(s): CKTOTAL, CKMB, CKMBINDEX, TROPONINI in the last 168 hours. CBG: Recent Labs  Lab 05/23/24 0627 05/23/24 1709 05/24/24 0051 05/24/24 0610 05/24/24 1159  GLUCAP 91 99 117* 90 92   Iron Studies: No results for input(s): IRON, TIBC, TRANSFERRIN, FERRITIN in the last 72 hours. Lab Results  Component Value Date   INR 1.1 05/22/2024   INR 1.2 11/20/2023   INR 1.2 09/18/2023   Studies/Results: PERIPHERAL VASCULAR CATHETERIZATION Result Date: 05/23/2024 Patient name: [name] MRN: [id number] DOB: 11/06/1950 Sex: male 05/23/2024 Pre-operative Diagnosis: 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 Sheree, MD Procedure Performed: 1.  Percutaneous ultrasound-guided cannulation and Mynx device closure right common femoral artery 2.  Catheter selection of aorta and aortogram with bilateral lower extremity angiography 3.  Catheter selection of left SFA 4.  Shockwave lithotripsy left SFA with 5  x 80 mm balloon for 400 pulses followed by stenting of left SFA with 6 x 150 mm Eluvia postdilated with 5 mm balloon 5.  Ultrasound-guided cannulation left posterior tibial artery 6.  Plain balloon angioplasty left posterior tibial artery with 3 x 150 mm balloon 7.  Moderate sedation with fentanyl  and Versed  for 99 minutes Indications: 73 year old male with severe left lower extremity pain with monophasic ABIs on the left and toe pressure of 0.  He is indicated for angiography with possible invention. Findings: Aorta and iliac segments are free of flow-limiting stenosis however the  hypogastric artery on the left is occluded.  Renal arteries are occluded consistent with need for dialysis.  Bilateral common femoral arteries appear calcified however there is brisk flow with palpable pulses that are strong.  The left lower extremity is the site of interest.  Flow is favorable through the profunda and sluggish into the SFA and the left SFA was then selected.  Above the adductor the SFA frankly occludes it was very difficult to determine reconstitution however the distal posterior tibial definitively reconstitutes and fills the medial and lateral plantar vessels.  We are unable to cross the heavily calcified lesion from above ultimately were able to cross retrograde and snared a through and through wire and perform shockwave lithotripsy followed by primary stenting with drug-eluting stent.  We performed balloon angioplasty of the posterior tibial artery for multiple areas of stenosis that were greater than 60% and these were reduced to 0% and at completion there was a systolic pressure of 160 mmHg at the ankle via the pedal sheath and strong signals after the pedal sheath was removed.  Procedure:  The patient was identified in the holding area and taken to room 8.  The patient was then placed supine on the table and prepped and draped in the usual sterile fashion.  A time out was called.  Ultrasound was used to evaluate the right common femoral artery.  There was circumferential calcium  however no flow-limiting stenosis and a strong pulse in the area was anesthetized and cannulated the right common femoral artery with micropuncture needle followed by wire and a sheath using direct ultrasound visualization and ultrasound images saved to the permanent record.  Concomitantly we administered fentanyl  and Versed  as moderate sedation and his vital signs were monitored by bedside nursing throughout the case.  We placed a Bentson wire followed by a 5 French sheath to the level of L1 and aortogram was  performed followed by bilateral pelvic angiography including the bilateral common femoral arteries.  We then crossed the bifurcation and performed angiography of the left lower extremity initially from selection of the left common femoral artery and then moved this into the left SFA given the brisk flow into the left profunda.  With the above findings we then placed the Glidewire advantage and a long 6 French sheath was placed into the left SFA and the patient was fully heparinized.  From above we attempted to cross the SFA occlusion and I was unable to cross this even using the back of a Glidewire given the dense calcifications.  The left medial ankle was then prepped and draped and I used ultrasound to cannulate the left posterior tibial artery and placed a micropuncture wire followed by sheath.  We perform retrograde angiography which demonstrated brisk flow retrograde however disease in the posterior tibial artery.  Using a V18 wire and a CXI catheter we are then able to get up to the area of occlusion of  the SFA and using the back of the V18 wire we are able to get true lumen only into the SFA and snared an 014 wire through and through.  We then primarily ballooned using shockwave lithotripsy for a total of 400 pulses throughout the occluded SFA segment and completion angiography demonstrated improvement in flow however there remained calcification narrowing primarily stented with a 6 x 150 mm drug-eluting stent postdilated with a 5 mm balloon.  At this time flow was brisk through the stent and there was a ankle pressure of 80 mmHg.  There was approximately 10% in-stent stenosis but with brisk flow we elected no further intervention.  We then performed angiography below the knee and demonstrated the posterior tibial artery stenoses and these were ballooned to 0% residual stenosis with brisk flow and now an ankle pressure of 160 mmHg through the pedal sheath.  Satisfied with this we removed the through and  through wire and then placed a Bentson wire and exchanged for 6 French sheath and deployed a minx device.  The patient tolerated the procedure without immediate complication.  Contrast: 100cc Brandon C. Sheree, MD [facility] Office: [phone] Pager: [phone]    Medications:  sodium chloride      anticoagulant sodium citrate       amLODipine   10 mg Oral QHS   aspirin  EC  81 mg Oral Daily   atorvastatin  10 mg Oral Daily   carvedilol   25 mg Oral BID WC   cinacalcet   90 mg Oral Q M,W,F-HD   clopidogrel  75 mg Oral Q breakfast   doxercalciferol   6 mcg Intravenous Q M,W,F-HD   heparin   5,000 Units Subcutaneous Q8H   insulin  aspart  0-6 Units Subcutaneous TID WC   sodium chloride  flush  3 mL Intravenous Q12H   sodium chloride  flush  3 mL Intravenous Q12H    Dialysis Orders: [facility] MWF EDW 68.1 kg 4 hrsF180 BFR 400 mL/ min AVF Profile 2 15 g needles Mircera 50 q 2 weeks Hectorol  6 q rx Sensipar  90 three times per week Sevelamer  4 tabs TID with meals Kayexelate 3x/ weekly  Assessment/Plan: 1 L critical limb ischemia: seen by VVS. S/p aortogram arteriogram of LLE with shockwave lithotripsy, angioplasty and stenting of left SFA, balloon angioplasty of left lower extremity by VVS 10/13. PT consulted and pharmacy following for lipid management. 2 ESRD: MWF. Next HD 10/15. 3 Hypertension: reasonably well controlled on amlodipine  and carvedilol , UF as tolerated 4. Anemia of ESRD: Hgb 10.9, no ESA needs yet 5. Metabolic Bone Disease: sevelamer  as binder, sensipar  with HD as well as hectorol  6.  DM II: A1c pending, per primary 7.  HLD: to resume statin 8.  Dispo: inpatient  Charmaine Piety, NP [facility] 05/24/2024,3:23 PM  LOS: 1 day

## 2024-05-24 NOTE — Discharge Instructions (Signed)
   [facility]  Discharge Instructions  Lower Extremity Angiogram; Angioplasty/Stenting  Please refer to the following instructions for your post-procedure care. Your surgeon or physician assistant will discuss any changes with you.  Activity  Avoid lifting more than 8 pounds (1 gallons of milk) for 5 days after your procedure. You may walk as much as you can tolerate. It's OK to drive after 72 hours.  Bathing/Showering  You may shower the day after your procedure. If you have a bandage, you may remove it at 24- 48 hours. Clean your incision site with mild soap and water. Pat the area dry with a clean towel.  Diet  Resume your pre-procedure diet. There are no special food restrictions following this procedure. All patients with peripheral vascular disease should follow a low fat/low cholesterol diet. In order to heal from your surgery, it is CRITICAL to get adequate nutrition. Your body requires vitamins, minerals, and protein. Vegetables are the best source of vitamins and minerals. Vegetables also provide the perfect balance of protein. Processed food has little nutritional value, so try to avoid this.  Medications  Resume taking all of your medications unless your doctor tells you not to. If your incision is causing pain, you may take over-the-counter pain relievers such as acetaminophen (Tylenol)  Follow Up  Follow up will be arranged at the time of your procedure. You may have an office visit scheduled or may be scheduled for surgery. Ask your surgeon if you have any questions.  Please call us immediately for any of the following conditions: .Severe or worsening pain your legs or feet at rest or with walking. .Increased pain, redness, drainage at your groin puncture site. .Fever of 101 degrees or higher. .If you have any mild or slow bleeding from your puncture site: lie down, apply firm constant pressure over the area with a piece of gauze or a  clean wash cloth for 30 minutes- no peeking!, call 911 right away if you are still bleeding after 30 minutes, or if the bleeding is heavy and unmanageable.  Reduce your risk factors of vascular disease:  . Stop smoking. If you would like help call [facility] at 1-800-QUIT-NOW (228-025-3008) or [place] at 319-820-6853. . Manage your cholesterol . Maintain a desired weight . Control your diabetes . Keep your blood pressure down .  If you have any questions, please call the office at [phone]

## 2024-05-24 NOTE — Progress Notes (Addendum)
 PHARMACIST LIPID MONITORING   [name] is a 73 y.o. male admitted on 05/22/2024 with Atherosclerosis native arteries with left lower extremity rest pain s/p aortogram, BL LE angiography, shockwave lithotripsy with stent, balloon angioplasty.  Pharmacy has been consulted to optimize lipid-lowering therapy with the indication of secondary prevention for clinical ASCVD.  Recent Labs:  Lipid Panel (last 6 months):   Lab Results  Component Value Date   CHOL 201 (H) 05/24/2024   TRIG 103 05/24/2024   HDL 55 05/24/2024   CHOLHDL 3.7 05/24/2024   VLDL 21 05/24/2024   LDLCALC 125 (H) 05/24/2024    Hepatic function panel (last 6 months):   Lab Results  Component Value Date   AST 16 05/23/2024   ALT 10 05/23/2024   ALKPHOS 105 05/23/2024   BILITOT 0.8 05/23/2024    SCr (since admission):   Serum creatinine: 7.89 mg/dL (H) 89/85/74 9699 Estimated creatinine clearance: 8.5 mL/min (A)  Current therapy and lipid therapy tolerance Current lipid-lowering therapy: atorvastatin 10mg  was resumed from previous med list but appears patient may not having been taking it for some time Previous lipid-lowering therapies (if applicable): none Documented or reported allergies or intolerances to lipid-lowering therapies (if applicable): none  Assessment:   Patient prefers no changes in lipid-lowering therapy at this time due to wanting to speak with the VVS team.   Plan:    1.Statin intensity (high intensity recommended for all patients regardless of the LDL):  no changes per patient preference  2.Add ezetimibe  (if any one of the following):   Not indicated at this time.  3.Refer to lipid clinic:   No  4.Follow-up with:  Primary care provider - Joshua [name] CROME, MD  5.Follow-up labs after discharge:  No changes in lipid therapy, repeat a lipid panel in one year.      Jinnie Door, PharmD, BCPS, BCCP Clinical Pharmacist  Please check AMION for all [facility] phone numbers After 10:00  PM, call Main Pharmacy [phone]

## 2024-05-24 NOTE — Progress Notes (Signed)
 Received patient in bed to unit.  Alert and oriented.  Informed consent signed and in chart.   TX duration: 3  Patient tolerated well.  System changed due to clotting-Tx restarted  Transported back to the room  Alert, without acute distress.  Hand-off given to patient's nurse. Yes  Access used: AVG Access issues: None  Total UF removed: 2000 Medication(s) given: None Post HD VS: T97.5-HR68-RR16 B/P-119/70 Post HD weight: 69.8kg  Neville Seip, RN [facility]   05/23/24 2345  Vitals  Temp (!) 97.5 F (36.4 C)  Temp Source Oral  BP 119/70  MAP (mmHg) 84  BP Location Left Arm  BP Method Automatic  Patient Position (if appropriate) Lying  Pulse Rate 68  Pulse Rate Source Monitor  ECG Heart Rate 68  Resp 16  Weight 69.8 kg  Type of Weight Post-Dialysis  Oxygen Therapy  SpO2 99 %  Pulse Oximetry Type Continuous  During Treatment Monitoring  Blood Flow Rate (mL/min) 0 mL/min  Arterial Pressure (mmHg) -21.41 mmHg  Venous Pressure (mmHg) 86.86 mmHg  TMP (mmHg) 48.28 mmHg  Ultrafiltration Rate (mL/min) 1930 mL/min  Dialysate Flow Rate (mL/min) 299 ml/min  Dialysate Potassium Concentration 2  Dialysate Calcium  Concentration 2.5  Duration of HD Treatment -hour(s) 3 hour(s)  Cumulative Fluid Removed (mL) per Treatment  2000.45  HD Safety Checks Performed Yes  Intra-Hemodialysis Comments Tolerated well;Tx completed  Post Treatment  Dialyzer Clearance Lightly streaked  Liters Processed 63.5  Fluid Removed (mL) 2000 mL  Tolerated HD Treatment Yes  AVG/AVF Arterial Site Held (minutes) 10 minutes  AVG/AVF Venous Site Held (minutes) 10 minutes  Fistula / Graft Right Upper arm Arteriovenous vein graft  Placement Date/Time: 07/18/21 0823   Placed prior to admission: No  Orientation: Right  Access Location: Upper arm  Access Type: (c) Arteriovenous vein graft  Site Condition No complications  Fistula / Graft Assessment Present;Thrill;Bruit  Status  Deaccessed;Flushed;Patent  Needle Size 15G  Drainage Description None

## 2024-05-24 NOTE — Evaluation (Signed)
 Physical Therapy Evaluation Patient Details Name: [name] MRN: [id number] DOB: 08/23/1950 Today's Date: 05/24/2024  History of Present Illness  73 y/o M adm 05/22/24 with LLE pain and ankle ulcer. 10/13 LLE angioplasty with stent. PMHx: HTN, ESRD on HD MWF, T2DM, seizure disorder, anemia of chronic disease, HLD, CHF, endocarditis, PAD.  Clinical Impression  Pt pleasant and hesitant to attempt mobility due to LLE pain. Pt with increased time for all transfers due to apprehension of pain stating stabbing and burning of Left foot. Pt required assist to don socks, use of RW and unable to stand on LLE. Pt educated for transfers, HEp, gait sequence and progression. Pt will benefit from acute therapy to maximize mobility, independence and function. HHPT recommended.         If plan is discharge home, recommend the following: A little help with walking and/or transfers;A little help with bathing/dressing/bathroom;Assistance with cooking/housework;Assist for transportation;Help with stairs or ramp for entrance   Can travel by private vehicle        Equipment Recommendations None recommended by PT  Recommendations for Other Services  OT consult    Functional Status Assessment Patient has had a recent decline in their functional status and demonstrates the ability to make significant improvements in function in a reasonable and predictable amount of time.     Precautions / Restrictions Precautions Precautions: Fall Recall of Precautions/Restrictions: Intact Restrictions Weight Bearing Restrictions Per Provider Order: No      Mobility  Bed Mobility Overal bed mobility: Modified Independent             General bed mobility comments: HOB 45 degrees with increased time to tolerate dependent positioning of LLE    Transfers Overall transfer level: Needs assistance   Transfers: Sit to/from Stand, Bed to chair/wheelchair/BSC Sit to Stand: Contact guard assist Stand pivot  transfers: Contact guard assist         General transfer comment: cues for hand placement with pt able to stand from bed, recliner and chair without arms total of 4 trials    Ambulation/Gait Ambulation/Gait assistance: Contact guard assist Gait Distance (Feet): 25 Feet Assistive device: Rolling walker (2 wheels) Gait Pattern/deviations: Step-to pattern   Gait velocity interpretation: <1.8 ft/sec, indicate of risk for recurrent falls   General Gait Details: pt hopped 3' then required seated rest, 25' then 15' when seated rest between trials. cues for extending knee in stance. pt unable to tolerate weight on LLE on heel or toe and maintained NWB LLE throughout trials with reliance on RW. Encouragement to maximize gait tolerance  Stairs            Wheelchair Mobility     Tilt Bed    Modified Rankin (Stroke Patients Only)       Balance Overall balance assessment: Needs assistance, History of Falls Sitting-balance support: No upper extremity supported, Feet supported Sitting balance-Leahy Scale: Good     Standing balance support: Bilateral upper extremity supported, During functional activity, Reliant on assistive device for balance Standing balance-Leahy Scale: Poor Standing balance comment: reliant on RW as unable to tolerate weight on LLE                             Pertinent Vitals/Pain Pain Assessment Pain Assessment: 0-10 Pain Score: 6  Pain Location: left foot with movement and dependent positioning Pain Descriptors / Indicators: Aching, Stabbing Pain Intervention(s): Limited activity within patient's tolerance, Repositioned, Monitored during session  Home Living Family/patient expects to be discharged to:: Private residence Living Arrangements: Spouse/significant other Available Help at Discharge: Family;Available 24 hours/day Type of Home: House Home Access: Stairs to enter Entrance Stairs-Rails: None Entrance Stairs-Number of Steps:  3 Alternate Level Stairs-Number of Steps: flight Home Layout: Two level;Able to live on main level with bedroom/bathroom;Full bath on main level Home Equipment: Rolling Walker (2 wheels);Shower seat;BSC/3in1;Cane - single point      Prior Function               Mobility Comments: RW recently and hopping normally without AD ADLs Comments: normally independent, increaased assist with decreased mobility since LLE pain, not driving     Extremity/Trunk Assessment   Upper Extremity Assessment Upper Extremity Assessment: Overall WFL for tasks assessed    Lower Extremity Assessment Lower Extremity Assessment: LLE deficits/detail LLE Deficits / Details: grossly WFL ROM, strength and weight bearing limited by pain LLE: Unable to fully assess due to pain    Cervical / Trunk Assessment Cervical / Trunk Assessment: Normal  Communication   Communication Communication: No apparent difficulties    Cognition Arousal: Alert Behavior During Therapy: WFL for tasks assessed/performed   PT - Cognitive impairments: No apparent impairments                         Following commands: Intact       Cueing Cueing Techniques: Verbal cues     General Comments      Exercises     Assessment/Plan    PT Assessment Patient needs continued PT services  PT Problem List Decreased strength;Decreased balance;Decreased range of motion;Decreased activity tolerance;Decreased mobility;Decreased knowledge of use of DME;Pain       PT Treatment Interventions DME instruction;Gait training;Stair training;Functional mobility training;Balance training;Therapeutic exercise;Therapeutic activities;Patient/family education    PT Goals (Current goals can be found in the Care Plan section)  Acute Rehab PT Goals Patient Stated Goal: return home PT Goal Formulation: With patient Time For Goal Achievement: 06/07/24 Potential to Achieve Goals: Good    Frequency Min 2X/week     Co-evaluation                AM-PAC PT 6 Clicks Mobility  Outcome Measure Help needed turning from your back to your side while in a flat bed without using bedrails?: None Help needed moving from lying on your back to sitting on the side of a flat bed without using bedrails?: None Help needed moving to and from a bed to a chair (including a wheelchair)?: A Little Help needed standing up from a chair using your arms (e.g., wheelchair or bedside chair)?: A Little Help needed to walk in hospital room?: A Little Help needed climbing 3-5 steps with a railing? : A Lot 6 Click Score: 19    End of Session Equipment Utilized During Treatment: Gait belt Activity Tolerance: Patient limited by pain Patient left: in chair;with call bell/phone within reach Nurse Communication: Mobility status PT Visit Diagnosis: Other abnormalities of gait and mobility (R26.89);Muscle weakness (generalized) (M62.81)    Time: 8675-8645 PT Time Calculation (min) (ACUTE ONLY): 30 min   Charges:   PT Evaluation $PT Eval Moderate Complexity: 1 Mod PT Treatments $Therapeutic Activity: 8-22 mins PT General Charges $$ ACUTE PT VISIT: 1 Visit         [name], PT Acute Rehabilitation Services Office: [phone]   Greg NOVAK Ebb Carelock 05/24/2024, 4:14 PM

## 2024-05-25 DIAGNOSIS — I70222 Atherosclerosis of native arteries of extremities with rest pain, left leg: Secondary | ICD-10-CM

## 2024-05-25 DIAGNOSIS — E119 Type 2 diabetes mellitus without complications: Secondary | ICD-10-CM | POA: Diagnosis not present

## 2024-05-25 DIAGNOSIS — N186 End stage renal disease: Secondary | ICD-10-CM | POA: Diagnosis not present

## 2024-05-25 DIAGNOSIS — I10 Essential (primary) hypertension: Secondary | ICD-10-CM | POA: Diagnosis not present

## 2024-05-25 DIAGNOSIS — L97329 Non-pressure chronic ulcer of left ankle with unspecified severity: Secondary | ICD-10-CM | POA: Diagnosis not present

## 2024-05-25 LAB — GLUCOSE, CAPILLARY
Glucose-Capillary: 89 mg/dL (ref 70–99)
Glucose-Capillary: 79 mg/dL (ref 70–99)

## 2024-05-25 MED ORDER — LIDOCAINE-PRILOCAINE 2.5-2.5 % EX CREA: 1.0000 | TOPICAL_CREAM | CUTANEOUS | Status: DC | PRN

## 2024-05-25 MED ORDER — TRAMADOL HCL 50 MG PO TABS: 50.0000 mg | ORAL_TABLET | Freq: Three times a day (TID) | ORAL | 0 refills | Status: AC | PRN

## 2024-05-25 MED ORDER — ASPIRIN 81 MG PO TBEC: 81.0000 mg | DELAYED_RELEASE_TABLET | Freq: Every day | ORAL | 12 refills | Status: AC

## 2024-05-25 MED ORDER — ATORVASTATIN CALCIUM 10 MG PO TABS: 10.0000 mg | ORAL_TABLET | Freq: Every day | ORAL | 2 refills | Status: AC

## 2024-05-25 MED ORDER — LIDOCAINE HCL (PF) 1 % IJ SOLN: 5.0000 mL | INTRAMUSCULAR | Status: DC | PRN

## 2024-05-25 MED ORDER — HEPARIN SODIUM (PORCINE) 1000 UNIT/ML DIALYSIS
1000.0000 [IU] | INTRAMUSCULAR | Status: DC | PRN
Start: 2024-05-25 — End: 2024-05-25

## 2024-05-25 MED ORDER — CLOPIDOGREL BISULFATE 75 MG PO TABS: 75.0000 mg | ORAL_TABLET | Freq: Every day | ORAL | 0 refills | Status: AC

## 2024-05-25 MED ORDER — ALTEPLASE 2 MG IJ SOLR: 2.0000 mg | Freq: Once | INTRAMUSCULAR | Status: DC | PRN

## 2024-05-25 MED ORDER — PENTAFLUOROPROP-TETRAFLUOROETH EX AERO
1.0000 | INHALATION_SPRAY | CUTANEOUS | Status: DC | PRN
Start: 2024-05-25 — End: 2024-05-25

## 2024-05-25 NOTE — Progress Notes (Signed)
 OT Cancellation Note  Patient Details Name: [name] MRN: [id number] DOB: 1950-11-25   Cancelled Treatment:    Reason Eval/Treat Not Completed: Patient at procedure or test/ unavailable (Patient at HD)  [name] 05/25/2024, 1:14 PM

## 2024-05-25 NOTE — Discharge Summary (Signed)
 Physician Discharge Summary  [name] FMW:[id number] DOB: [birth year] DOA: 05/22/2024  PCP: Joshua [name] CROME, MD  Admit date: 05/22/2024 Discharge date: 05/25/24  Admitted From: Home Disposition: Home Recommendations for Outpatient Follow-up:  Outpatient follow-up with vascular surgery as below Check CMP and CBC at follow-up Please follow up on the following pending results: None  Home Health: HHPT Equipment/Devices: No any needs identified  Discharge Condition: Stable CODE STATUS: Full code Diet Orders (From admission, onward)     Start     Ordered   05/24/24 0908  Diet renal/carb modified with fluid restriction Diet-HS Snack? Nothing; Fluid restriction: 1200 mL Fluid; Room service appropriate? Yes with Assist; Fluid consistency: Thin  Diet effective now       Question Answer Comment  Diet-HS Snack? Nothing   Fluid restriction: 1200 mL Fluid   Room service appropriate? Yes with Assist   Fluid consistency: Thin      05/24/24 0908             Follow-up Information     [facility] Follow up in 1 month(s).   Specialty: Vascular Surgery Why: The office will call you with your appointment Contact information: [address] [place] [employer] [place]  [phone]        Care, [facility] Follow up.   Specialty: Home Health Services Why: they will contact you to set up home health services in 1-2 day Contact information: [address] [place] [address] [phone]                 Hospital course 73 y.o. male with PMH of ESRD on HD MWF, HFmrEF, HTN, HLD, DM-2 and PAD presenting with progressive left leg pain which has been going on for 3 to 4 weeks now.  Testing negative for DVT.  ABI showed noncompressible lower extremity vessels bilaterally.  Vascular surgery consulted.  Patient underwent LLE arteriogram with shockwave lithotripsy, angioplasty  and stenting of left SFA, balloon angioplasty of left PT done on 10/13.  No postop complications.  He is cleared for discharge by vascular surgery on Plavix, statin and aspirin .  Outpatient follow-up with vascular surgery as above.  Home health physical therapy ordered as recommended by therapy.  See individual problem list below for more.   Problems addressed during this hospitalization Left limb ischemia Early Achilles ischemic ulcer PAD, bilateral -ABI performed and showed noncompressible vessels bilaterally. -S/p Aortogram arteriogram of LLE with shockwave lithotripsy, angioplasty and stenting of left SFA, balloon angioplasty of left PT done on 10/13 with good results.  -Lower extremity venous Doppler showed no evidence of DVT -Continue with DAPT and statin. 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 - Continue outpatient HD MWF.   Chronic HFmrEF: TTE in 09/2023 with LVEF of 45 to 50%, indeterminate DD and normal RV SF. - Volume managed with dialysis - Continue Coreg    Hypertension - Continue amlodipine  and Coreg    Hyperlipidemia - Continue with statin   Diabetes type II: Controlled.  A1c 4.7%.  Not on meds.   Body mass index is 22.08 kg/m.           Consultations: Vascular surgery Nephrology  Time spent 35  minutes  Vital signs Vitals:   05/25/24 1230 05/25/24 1309 05/25/24 1314 05/25/24 1403  BP: (!) 146/54 (!) 142/79 (!) 142/79 (!) 149/74  Pulse:  75 90 78 77  Temp:   97.8 F (36.6 C) 98.3 F (36.8 C)  Resp: 12 19 10 12   Height:      Weight:      SpO2: 100% 100% 100% 100%  TempSrc:    Oral  BMI (Calculated):         Discharge exam  GENERAL: No apparent distress.  Nontoxic. HEENT: MMM.  Vision and hearing grossly intact.  NECK: Supple.  No apparent JVD.  RESP:  No IWOB.  Fair aeration bilaterally. CVS:  RRR. Heart sounds normal.  ABD/GI/GU: BS+. Abd soft, NTND.  MSK/EXT:  Moves extremities.   Significant muscle mass and subcu fat loss. SKIN: no apparent skin lesion or wound NEURO: Awake and alert. Oriented appropriately.  No apparent focal neuro deficit. PSYCH: Calm. Normal affect.   Discharge Instructions Discharge Instructions     Discharge instructions   Complete by: As directed    It has been a pleasure taking care of you!  You were hospitalized due to poor blood flow to your left foot for which you have been treated surgically and medically.  We have started you on new medication to maintain good blood flow to the left foot.  Is very important that you take your medications as prescribed.  Follow-up with vascular surgery per their recommendation.   Take care,   Increase activity slowly   Complete by: As directed    No wound care   Complete by: As directed       Allergies as of 05/25/2024   No Known Allergies      Medication List     TAKE these medications    amLODipine  10 MG tablet Commonly known as: NORVASC  Take 10 mg by mouth at bedtime.   aspirin  EC 81 MG tablet Take 1 tablet (81 mg total) by mouth daily. Swallow whole.   atorvastatin 10 MG tablet Commonly known as: LIPITOR Take 1 tablet (10 mg total) by mouth daily.   carvedilol  25 MG tablet Commonly known as: COREG  Take 25 mg by mouth 2 (two) times daily with a meal.   cinacalcet  30 MG tablet Commonly known as: SENSIPAR  Take 2 tablets (60 mg total) by mouth every Monday, Wednesday, and Friday at 6 PM.   clopidogrel 75 MG tablet Commonly known as: PLAVIX Take 1 tablet (75 mg total) by mouth daily with breakfast.   gabapentin  100 MG capsule Commonly known as: NEURONTIN  Take 100 mg by mouth daily as needed (nerve pain).   polyethylene glycol 17 g packet Commonly known as: MIRALAX  / GLYCOLAX  Take 17 g by mouth daily. What changed:  when to take this reasons to take this   sevelamer  carbonate 800 MG tablet Commonly known as: RENVELA  Take 4 tablets by mouth 3 (three) times daily  with meals.   sodium polystyrene powder Commonly known as: KAYEXALATE Take 30 g by mouth 2 (two) times a week. Thursdays Sundays   traMADol  50 MG tablet Commonly known as: Ultram  Take 1 tablet (50 mg total) by mouth every 8 (eight) hours as needed for up to 7 days. What changed: when to take this         Procedures/Studies: 05/23/2024- Aortogram arteriogram of LLE with shockwave lithotripsy, angioplasty and stenting of left SFA, balloon angioplasty of left posterior tibial artery   PERIPHERAL VASCULAR CATHETERIZATION Result Date: 05/23/2024 Patient name: [name] MRN: [id number] DOB: [birth year] Sex: male 05/23/2024 Pre-operative Diagnosis: Atherosclerosis native arteries with left lower extremity rest pain, end-stage renal disease  Post-operative diagnosis:  Same Surgeon:  Penne BROCKS. Sheree, MD Procedure Performed: 1.  Percutaneous ultrasound-guided cannulation and Mynx device closure right common femoral artery 2.  Catheter selection of aorta and aortogram with bilateral lower extremity angiography 3.  Catheter selection of left SFA 4.  Shockwave lithotripsy left SFA with 5 x 80 mm balloon for 400 pulses followed by stenting of left SFA with 6 x 150 mm Eluvia postdilated with 5 mm balloon 5.  Ultrasound-guided cannulation left posterior tibial artery 6.  Plain balloon angioplasty left posterior tibial artery with 3 x 150 mm balloon 7.  Moderate sedation with fentanyl  and Versed  for 99 minutes Indications: 73 year old male with severe left lower extremity pain with monophasic ABIs on the left and toe pressure of 0.  He is indicated for angiography with possible invention. Findings: Aorta and iliac segments are free of flow-limiting stenosis however the hypogastric artery on the left is occluded.  Renal arteries are occluded consistent with need for dialysis.  Bilateral common femoral arteries appear calcified however there is brisk flow with palpable pulses that are strong.  The left lower  extremity is the site of interest.  Flow is favorable through the profunda and sluggish into the SFA and the left SFA was then selected.  Above the adductor the SFA frankly occludes it was very difficult to determine reconstitution however the distal posterior tibial definitively reconstitutes and fills the medial and lateral plantar vessels.  We are unable to cross the heavily calcified lesion from above ultimately were able to cross retrograde and snared a through and through wire and perform shockwave lithotripsy followed by primary stenting with drug-eluting stent.  We performed balloon angioplasty of the posterior tibial artery for multiple areas of stenosis that were greater than 60% and these were reduced to 0% and at completion there was a systolic pressure of 160 mmHg at the ankle via the pedal sheath and strong signals after the pedal sheath was removed.  Procedure:  The patient was identified in the holding area and taken to room 8.  The patient was then placed supine on the table and prepped and draped in the usual sterile fashion.  A time out was called.  Ultrasound was used to evaluate the right common femoral artery.  There was circumferential calcium  however no flow-limiting stenosis and a strong pulse in the area was anesthetized and cannulated the right common femoral artery with micropuncture needle followed by wire and a sheath using direct ultrasound visualization and ultrasound images saved to the permanent record.  Concomitantly we administered fentanyl  and Versed  as moderate sedation and his vital signs were monitored by bedside nursing throughout the case.  We placed a Bentson wire followed by a 5 French sheath to the level of L1 and aortogram was performed followed by bilateral pelvic angiography including the bilateral common femoral arteries.  We then crossed the bifurcation and performed angiography of the left lower extremity initially from selection of the left common femoral artery  and then moved this into the left SFA given the brisk flow into the left profunda.  With the above findings we then placed the Glidewire advantage and a long 6 French sheath was placed into the left SFA and the patient was fully heparinized.  From above we attempted to cross the SFA occlusion and I was unable to cross this even using the back of a Glidewire given the dense calcifications.  The left medial ankle was then prepped and draped and I used ultrasound to  cannulate the left posterior tibial artery and placed a micropuncture wire followed by sheath.  We perform retrograde angiography which demonstrated brisk flow retrograde however disease in the posterior tibial artery.  Using a V18 wire and a CXI catheter we are then able to get up to the area of occlusion of the SFA and using the back of the V18 wire we are able to get true lumen only into the SFA and snared an 014 wire through and through.  We then primarily ballooned using shockwave lithotripsy for a total of 400 pulses throughout the occluded SFA segment and completion angiography demonstrated improvement in flow however there remained calcification narrowing primarily stented with a 6 x 150 mm drug-eluting stent postdilated with a 5 mm balloon.  At this time flow was brisk through the stent and there was a ankle pressure of 80 mmHg.  There was approximately 10% in-stent stenosis but with brisk flow we elected no further intervention.  We then performed angiography below the knee and demonstrated the posterior tibial artery stenoses and these were ballooned to 0% residual stenosis with brisk flow and now an ankle pressure of 160 mmHg through the pedal sheath.  Satisfied with this we removed the through and through wire and then placed a Bentson wire and exchanged for 6 French sheath and deployed a minx device.  The patient tolerated the procedure without immediate complication.  Contrast: 100cc Brandon C. Sheree, MD [facility]  [place] Office: [phone] Pager: [phone]   VAS US  LOWER EXTREMITY VENOUS (DVT) (7a-7p) Result Date: 05/22/2024  Lower Venous DVT Study Patient Name:  [name]  Date of Exam:   05/22/2024 Medical Rec #: [id number]          Accession #:    [id number] Date of Birth: 06/24/1951          Patient Gender: M Patient Age:   22 years Exam Location:  [facility] Procedure:      VAS US  LOWER EXTREMITY VENOUS (DVT) Referring Phys: HAMP FONDAW --------------------------------------------------------------------------------  Indications: Pain. Patient had recent procedure for ingrown left great toe with Podiatry 04/20/24. Now, with ischemic type pain in left lower leg and foot.  Limitations: Shadowing from arterial calcification. Comparison Study: No prior left LEV on file Performing Technologist: Alberta Lis RVS  Examination Guidelines: A complete evaluation includes B-mode imaging, spectral Doppler, color Doppler, and power Doppler as needed of all accessible portions of each vessel. Bilateral testing is considered an integral part of a complete examination. Limited examinations for reoccurring indications may be performed as noted. The reflux portion of the exam is performed with the patient in reverse Trendelenburg.  +-----+---------------+---------+-----------+----------+--------------+ RIGHTCompressibilityPhasicitySpontaneityPropertiesThrombus Aging +-----+---------------+---------+-----------+----------+--------------+ CFV  Full           Yes      Yes                                 +-----+---------------+---------+-----------+----------+--------------+ SFJ  Full                                                        +-----+---------------+---------+-----------+----------+--------------+   +---------+---------------+---------+-----------+----------+--------------+ LEFT     CompressibilityPhasicitySpontaneityPropertiesThrombus Aging  +---------+---------------+---------+-----------+----------+--------------+ CFV      Full  Yes      Yes                                 +---------+---------------+---------+-----------+----------+--------------+ SFJ      Full                                                        +---------+---------------+---------+-----------+----------+--------------+ FV Prox  Full                                                        +---------+---------------+---------+-----------+----------+--------------+ FV Mid   Full                                                        +---------+---------------+---------+-----------+----------+--------------+ FV DistalFull                                                        +---------+---------------+---------+-----------+----------+--------------+ PFV      Full                                                        +---------+---------------+---------+-----------+----------+--------------+ POP      Full           Yes      Yes                                 +---------+---------------+---------+-----------+----------+--------------+ PTV      Full                                                        +---------+---------------+---------+-----------+----------+--------------+ PERO     Full                                                        +---------+---------------+---------+-----------+----------+--------------+ Gastroc  Full                                                        +---------+---------------+---------+-----------+----------+--------------+     Summary: RIGHT: - No evidence of common femoral vein obstruction.  -  Incidentally, heavy calcification noted in the CFA and FA  LEFT: - There is no evidence of deep vein thrombosis in the lower extremity.  - No cystic structure found in the popliteal fossa. - Incidentally, heavy calcification noted throughout the left lower extremity arterial  system  *See table(s) above for measurements and observations. Electronically signed by Gaile New MD on 05/22/2024 at 8:46:14 PM.    Final    VAS US  ABI WITH/WO TBI Result Date: 05/22/2024  LOWER EXTREMITY DOPPLER STUDY Patient Name:  [name]  Date of Exam:   05/22/2024 Medical Rec #: [id number]          Accession #:    [id number] Date of Birth: 03-14-1951          Patient Gender: M Patient Age:   32 years Exam Location:  [facility] Procedure:      VAS US  ABI WITH/WO TBI Referring Phys: HAMP FONDAW --------------------------------------------------------------------------------  Indications: Claudication, rest pain, and peripheral artery disease. High Risk Factors: Hypertension, hyperlipidemia, Diabetes, no history of                    smoking. Other Factors: ESRD on dialysis, failed kidney transplant,.  Limitations: Today's exam was limited due to involuntary patient movement and              patient unable to extend leg secondary to rest pain. Comparison Study: Prior ABI done 03/18/21 Performing Technologist: Rachel Pellet RVS  Examination Guidelines: A complete evaluation includes at minimum, Doppler waveform signals and systolic blood pressure reading at the level of bilateral brachial, anterior tibial, and posterior tibial arteries, when vessel segments are accessible. Bilateral testing is considered an integral part of a complete examination. Photoelectric Plethysmograph (PPG) waveforms and toe systolic pressure readings are included as required and additional duplex testing as needed. Limited examinations for reoccurring indications may be performed as noted.  ABI Findings: +---------+------------------+-----+-----------+-------------------+ Right    Rt Pressure (mmHg)IndexWaveform   Comment             +---------+------------------+-----+-----------+-------------------+ Brachial                                   Restricted/dialysis  +---------+------------------+-----+-----------+-------------------+ PTA      254               1.46 multiphasic                    +---------+------------------+-----+-----------+-------------------+ DP       254               1.46 multiphasic                    +---------+------------------+-----+-----------+-------------------+ Great Toe61                0.35 Normal                         +---------+------------------+-----+-----------+-------------------+ +---------+------------------+-----+-------------------+-------+ Left     Lt Pressure (mmHg)IndexWaveform           Comment +---------+------------------+-----+-------------------+-------+ Brachial 174                    triphasic                  +---------+------------------+-----+-------------------+-------+ PTA      254  1.46 dampened monophasic        +---------+------------------+-----+-------------------+-------+ DP       254               1.46 dampened monophasic        +---------+------------------+-----+-------------------+-------+ Great Toe0                 0.00 Absent                     +---------+------------------+-----+-------------------+-------+ +-------+---------------------+-----------+------------+------------+ ABI/TBIToday's ABI          Today's TBIPrevious ABIPrevious TBI +-------+---------------------+-----------+------------+------------+ Right  1.46/non compressible0.35       0.94        0.52         +-------+---------------------+-----------+------------+------------+ Left   1.46/non compressibleabsent     1.06        0.54         +-------+---------------------+-----------+------------+------------+ Arterial wall calcification precludes accurate ankle pressures and ABIs. Bilateral TBIs appear decreased compared to prior study on 03/18/21. Bilateral ABIs appear calcified compared to prior study on 03/18/21.  Summary: Right: Resting right ankle-brachial index  indicates noncompressible right lower extremity arteries. The right toe-brachial index is abnormal.  Left: Resting left ankle-brachial index indicates noncompressible left lower extremity arteries, however, waveforms are severely dampened. The left toe-brachial index is abnormal.  *See table(s) above for measurements and observations.  Suggest Peripheral Vascular Consult. Electronically signed by Gaile New MD on 05/22/2024 at 8:45:19 PM.    Final        The results of significant diagnostics from this hospitalization (including imaging, microbiology, ancillary and laboratory) are listed below for reference.     Microbiology: No results found for this or any previous visit (from the past 240 hours).   Labs:  CBC: Recent Labs  Lab 05/22/24 0845 05/23/24 0407 05/24/24 0300  WBC 6.6 6.2 5.7  NEUTROABS 4.4  --   --   HGB 11.3* 10.2* 10.9*  HCT 35.9* 31.7* 33.7*  MCV 93.0 90.1 91.3  PLT 179 172 192   BMP &GFR Recent Labs  Lab 05/22/24 0845 05/23/24 0407 05/24/24 0300  NA 142 135 133*  K 4.8 5.6* 4.8  CL 91* 91* 93*  CO2 26 25 22   GLUCOSE 112* 95 86  BUN 54* 69* 41*  CREATININE 9.56* 11.23* 7.89*  CALCIUM  8.9 8.8* 8.9  MG 2.6*  --   --    Estimated Creatinine Clearance: 8.5 mL/min (A) (by C-G formula based on SCr of 7.89 mg/dL (H)). Liver & Pancreas: Recent Labs  Lab 05/22/24 0845 05/23/24 0407  AST 20 16  ALT 12 10  ALKPHOS 111 105  BILITOT 0.8 0.8  PROT 7.8 7.2  ALBUMIN 3.2* 3.1*   No results for input(s): LIPASE, AMYLASE in the last 168 hours. No results for input(s): AMMONIA in the last 168 hours. Diabetic: Recent Labs    05/23/24 0407  HGBA1C 4.7*   Recent Labs  Lab 05/24/24 1159 05/24/24 1625 05/24/24 2040 05/25/24 0551 05/25/24 1404  GLUCAP 92 143* 120* 89 79   Cardiac Enzymes: No results for input(s): CKTOTAL, CKMB, CKMBINDEX, TROPONINI in the last 168 hours. Recent Labs    03/22/24 1344  PROBNP 391.0*   Coagulation  Profile: Recent Labs  Lab 05/22/24 0845  INR 1.1   Thyroid  Function Tests: No results for input(s): TSH, T4TOTAL, FREET4, T3FREE, THYROIDAB in the last 72 hours. Lipid Profile: Recent Labs    05/24/24 0259  CHOL 201*  HDL 55  LDLCALC 125*  TRIG 103  CHOLHDL 3.7   Anemia Panel: No results for input(s): VITAMINB12, FOLATE, FERRITIN, TIBC, IRON, RETICCTPCT in the last 72 hours. Urine analysis:    Component Value Date/Time   COLORURINE YELLOW 09/16/2020 1256   APPEARANCEUR CLOUDY (A) 09/16/2020 1256   LABSPEC 1.011 09/16/2020 1256   PHURINE 6.0 09/16/2020 1256   GLUCOSEU NEGATIVE 09/16/2020 1256   GLUCOSEU NEGATIVE 07/30/2011 1440   HGBUR SMALL (A) 09/16/2020 1256   BILIRUBINUR NEGATIVE 09/16/2020 1256   KETONESUR NEGATIVE 09/16/2020 1256   PROTEINUR 100 (A) 09/16/2020 1256   UROBILINOGEN 0.2 09/30/2011 1824   NITRITE NEGATIVE 09/16/2020 1256   LEUKOCYTESUR LARGE (A) 09/16/2020 1256   Sepsis Labs: Invalid input(s): PROCALCITONIN, LACTICIDVEN   SIGNED:  Sekou Zuckerman T Geron Mulford, MD  [facility] 05/25/2024, 5:38 PM

## 2024-05-25 NOTE — TOC Transition Note (Signed)
 Transition of Care [facility]) - Discharge Note   Patient Details  Name: [name] MRN: [id number] Date of Birth: [birth year]  Transition of Care [facility]) CM/SW Contact:  Marval Gell, RN Phone Number: 05/25/2024, 9:54 AM   Clinical Narrative:     Patient in HD, called wife to set up DC plan.  She will provide transportation home.  She would like HH services, no preferences for provider, [facility] able to accept, Added to AVS  No other care management needs identified at this time      Barriers to Discharge: No Barriers Identified   Patient Goals and CMS Choice Patient states their goals for this hospitalization and ongoing recovery are:: to go home CMS Medicare.gov Compare Post Acute Care list provided to:: Patient Represenative (must comment) Choice offered to / list presented to : Spouse      Discharge Placement                       Discharge Plan and Services Additional resources added to the After Visit Summary for     Discharge Planning Services: CM Consult Post Acute Care Choice: Home Health          DME Arranged: N/A         HH Arranged: PT HH Agency: [facility] Date [facility] Agency Contacted: 05/25/24 Time HH Agency Contacted: [phone] Representative spoke with at [facility] Agency: Darleene  Social Drivers of Health (SDOH) Interventions SDOH Screenings   Food Insecurity: No Food Insecurity (05/22/2024)  Housing: Low Risk  (05/22/2024)  Transportation Needs: No Transportation Needs (05/22/2024)  Utilities: Not At Risk (05/22/2024)  Alcohol Screen: Low Risk  (03/30/2024)  Depression (PHQ2-9): Low Risk  (03/30/2024)  Financial Resource Strain: Low Risk  (03/30/2024)  Physical Activity: Insufficiently Active (03/30/2024)  Social Connections: Socially Integrated (05/22/2024)  Stress: No Stress Concern Present (03/30/2024)  Tobacco Use: Low Risk  (05/22/2024)  Health Literacy: Adequate Health Literacy (03/30/2024)     Readmission Risk Interventions     No  data to display

## 2024-05-25 NOTE — Progress Notes (Signed)
 Physical Therapy Treatment Patient Details Name: [name] MRN: [id number] DOB: 05/04/1951 Today's Date: 05/25/2024   History of Present Illness 73 y/o M adm 05/22/24 with LLE pain and ankle ulcer. 10/13 LLE angioplasty with stent. PMHx: HTN, ESRD on HD MWF, T2DM, seizure disorder, anemia of chronic disease, HLD, CHF, endocarditis, PAD.    PT Comments  Pt received in supine after HD session, pt lethargic and c/o fatigue and severe L ankle pain, RN notified. Pt defers OOB mobility, wanting to save energy for DC, agreeable to bed level session for instruction on safe stair negotiation technique and pt given gait belt fitted to him for home for safety and fall risk prevention. Pt given demo of x2 techniques for stairs depending on his energy level and handout to reinforce since spouse not in room during session. Pt continues to benefit from PT services to progress toward functional mobility goals, continue to recommend HHPT to progress mobility and reduce his fall risk.    If plan is discharge home, recommend the following: A little help with walking and/or transfers;A little help with bathing/dressing/bathroom;Assistance with cooking/housework;Assist for transportation;Help with stairs or ramp for entrance   Can travel by private vehicle        Equipment Recommendations  None recommended by PT;Other (comment) (recommend ramp for home)    Recommendations for Other Services       Precautions / Restrictions Precautions Precautions: Fall Recall of Precautions/Restrictions: Intact Restrictions Weight Bearing Restrictions Per Provider Order: No     Mobility  Bed Mobility Overal bed mobility: Needs Assistance Bed Mobility: Supine to Sit     Supine to sit: Contact guard, Used rails, HOB elevated     General bed mobility comments: pt requesting to pull up on bil rails to sit up; fatigued after HD    Transfers                   General transfer comment: pt defers due  to fatigue after HD; pain also    Ambulation/Gait                   Stairs Stairs: Yes       General stair comments: pt defers to perform but PTA instructed pt via visual/verbal demo with platform step in room how to ascend/descend via hop-up or TDWB with RW and no rails, or seated scooting up 3 steps on bottom and using chair to get from top to step to seated posture, then up to RW from chair for energy conservation/safety if too tired to hop. Pt receptive; handout given for pt/caregiver safety to reinforce technique and also gait belt for home.   Wheelchair Mobility     Tilt Bed    Modified Rankin (Stroke Patients Only)       Balance Overall balance assessment: Needs assistance, History of Falls Sitting-balance support: No upper extremity supported, Feet supported Sitting balance-Leahy Scale: Fair Sitting balance - Comments:  (long sit in bed)       Standing balance comment: pt defers due to fatigue/pain after HD                            Communication Communication Communication: No apparent difficulties Factors Affecting Communication:  (soft voice, fatigued)  Cognition Arousal: Lethargic Behavior During Therapy: WFL for tasks assessed/performed   PT - Cognitive impairments: No apparent impairments  PT - Cognition Comments: pt drowsy after HD, does open eyes and follow conversation, but slow to perform bed mobility due to fatigue. Following commands: Intact      Cueing Cueing Techniques: Verbal cues  Exercises Other Exercises Other Exercises: encouraged ankle pumps and L knee extension as able when resting to reduce stiffness, pt mostly keeping L knee flexed initially    General Comments General comments (skin integrity, edema, etc.): heel floated on LLE, ice to sore area, encouraged only 10 mins on/off with ice to protect skin integrity      Pertinent Vitals/Pain Pain Assessment Pain Assessment:  0-10 Pain Score: 9  Pain Location: left foot with movement and dependent positioning Pain Descriptors / Indicators: Aching, Stabbing Pain Intervention(s): Limited activity within patient's tolerance, Monitored during session, Repositioned, Patient requesting pain meds-RN notified, Ice applied    Home Living                          Prior Function            PT Goals (current goals can now be found in the care plan section) Acute Rehab PT Goals Patient Stated Goal: return home PT Goal Formulation: With patient Time For Goal Achievement: 06/07/24 Progress towards PT goals: Not progressing toward goals - comment (pain/fatigue limiting him today)    Frequency    Min 2X/week      PT Plan      Co-evaluation              AM-PAC PT 6 Clicks Mobility   Outcome Measure  Help needed turning from your back to your side while in a flat bed without using bedrails?: None Help needed moving from lying on your back to sitting on the side of a flat bed without using bedrails?: A Little Help needed moving to and from a bed to a chair (including a wheelchair)?: A Little Help needed standing up from a chair using your arms (e.g., wheelchair or bedside chair)?: A Little Help needed to walk in hospital room?: A Little Help needed climbing 3-5 steps with a railing? : A Lot 6 Click Score: 18    End of Session Equipment Utilized During Treatment: Gait belt (fitted to his size to take home) Activity Tolerance: Patient limited by fatigue;Patient limited by pain Patient left: in bed;with call bell/phone within reach;with bed alarm set;Other (comment) (L heel floated with ice pack) Nurse Communication: Mobility status;Patient requests pain meds PT Visit Diagnosis: Other abnormalities of gait and mobility (R26.89);Muscle weakness (generalized) (M62.81)     Time: 8387-8369 PT Time Calculation (min) (ACUTE ONLY): 18 min  Charges:    $Therapeutic Activity: 8-22 mins PT  General Charges $$ ACUTE PT VISIT: 1 Visit                     Lindberg Zenon P., PTA Acute Rehabilitation Services Secure Chat Preferred 9a-5:30pm Office: [phone]    Connell HERO [facility] 05/25/2024, 6:20 PM

## 2024-05-25 NOTE — Plan of Care (Signed)

## 2024-05-25 NOTE — Progress Notes (Signed)
 PT Cancellation Note  Patient Details Name: [name] MRN: [id number] DOB: Apr 20, 1951   Cancelled Treatment:    Reason Eval/Treat Not Completed: (P) Patient at procedure or test/unavailable (pt at HD dept) Appears to have gone to HD at ~8:30am, likely return by 1pm, will continue efforts per PT plan of care as schedule permits.   Lisa-Marie Rueger M Pollyann Roa 05/25/2024, 10:23 AM

## 2024-05-25 NOTE — Discharge Planning (Signed)
 [facility] Patient Discharge Orders- [facility] CLINIC: [facility]  Patient's name: [name] Admit/DC Dates: 05/22/2024 - 05/25/2024  Discharge Diagnoses: PAD, LLE ischemia, early Achilles ischemic ulcer: s/p ABI performed and showed noncompressible vessels bilaterally. S/p Aortogram arteriogram of LLE with shockwave lithotripsy, angioplasty and stenting of left SFA, balloon angioplasty of left PT done on 10/13 with good results. On DAPT and statin. Outpatient f/u with [facility]   Aranesp: Given: No    Last Hgb: 10.9 PRBC's Given: No ESA dose for discharge: Continue mircera 50 mcg IV q 2 weeks  IV Iron dose at discharge: N/A  Heparin  change: No  EDW Change: No  Bath Change: No  Access intervention/Change: No  Hectorol  change: No  Discharge Labs: Calcium  8.9   Albumin 3.1  K+ 4.8  IV Antibiotics: No  On Coumadin?: No    OTHER/APPTS/LAB ORDERS:    D/C Meds to be reconciled by nurse after every discharge.  Completed By: Charmaine Piety, NP   Reviewed by: MD:______ RN_______

## 2024-05-25 NOTE — Progress Notes (Signed)
 [facility] Progress Note   Subjective:    Seen and examined patient on HD. Tolerating UFG 1L. BP is 116/65. Noted discharge order in place.  Objective Vitals:   05/25/24 0842 05/25/24 0900 05/25/24 0930 05/25/24 1000  BP: 116/65 110/64 111/64 105/62  Pulse: 70 71 74 71  Resp: 19 16 15 12   Temp:      TempSrc:      SpO2: 100% 100% 100% 99%  Weight:      Height:       Physical Exam General: Alert, awake, NAD Heart: S1 and S2; No murmurs, gallops, or rubs Lungs: Clear throughout Abdomen:Soft and non-tender Extremities: No LE edema Dialysis Access: R AVG   Filed Weights   05/22/24 0844 05/23/24 1857 05/23/24 2345  Weight: 70.3 kg 71.8 kg 69.8 kg    Intake/Output Summary (Last 24 hours) at 05/25/2024 1029 Last data filed at 05/25/2024 0600 Gross per 24 hour  Intake 960 ml  Output 0 ml  Net 960 ml    Additional Objective Labs: Basic Metabolic Panel: Recent Labs  Lab 05/22/24 0845 05/23/24 0407 05/24/24 0300  NA 142 135 133*  K 4.8 5.6* 4.8  CL 91* 91* 93*  CO2 26 25 22   GLUCOSE 112* 95 86  BUN 54* 69* 41*  CREATININE 9.56* 11.23* 7.89*  CALCIUM  8.9 8.8* 8.9   Liver Function Tests: Recent Labs  Lab 05/22/24 0845 05/23/24 0407  AST 20 16  ALT 12 10  ALKPHOS 111 105  BILITOT 0.8 0.8  PROT 7.8 7.2  ALBUMIN 3.2* 3.1*   No results for input(s): LIPASE, AMYLASE in the last 168 hours. CBC: Recent Labs  Lab 05/22/24 0845 05/23/24 0407 05/24/24 0300  WBC 6.6 6.2 5.7  NEUTROABS 4.4  --   --   HGB 11.3* 10.2* 10.9*  HCT 35.9* 31.7* 33.7*  MCV 93.0 90.1 91.3  PLT 179 172 192   Blood Culture    Component Value Date/Time   SDES BLOOD LEFT ANTECUBITAL 11/20/2023 0035   SPECREQUEST AEROBIC BOTTLE ONLY Blood Culture adequate volume 11/20/2023 0035   CULT  11/20/2023 0035    NO GROWTH 5 DAYS Performed at [facility], [address], [place], [place] [address]    REPTSTATUS 11/25/2023 FINAL 11/20/2023 0035    Cardiac  Enzymes: No results for input(s): CKTOTAL, CKMB, CKMBINDEX, TROPONINI in the last 168 hours. CBG: Recent Labs  Lab 05/24/24 0610 05/24/24 1159 05/24/24 1625 05/24/24 2040 05/25/24 0551  GLUCAP 90 92 143* 120* 89   Iron Studies: No results for input(s): IRON, TIBC, TRANSFERRIN, FERRITIN in the last 72 hours. Lab Results  Component Value Date   INR 1.1 05/22/2024   INR 1.2 11/20/2023   INR 1.2 09/18/2023   Studies/Results: PERIPHERAL VASCULAR CATHETERIZATION Result Date: 05/23/2024 Patient name: [name] MRN: [id number] DOB: 03/23/1951 Sex: male 05/23/2024 Pre-operative Diagnosis: 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 Sheree, MD Procedure Performed: 1.  Percutaneous ultrasound-guided cannulation and Mynx device closure right common femoral artery 2.  Catheter selection of aorta and aortogram with bilateral lower extremity angiography 3.  Catheter selection of left SFA 4.  Shockwave lithotripsy left SFA with 5 x 80 mm balloon for 400 pulses followed by stenting of left SFA with 6 x 150 mm Eluvia postdilated with 5 mm balloon 5.  Ultrasound-guided cannulation left posterior tibial artery 6.  Plain balloon angioplasty left posterior tibial artery with  3 x 150 mm balloon 7.  Moderate sedation with fentanyl  and Versed  for 99 minutes Indications: 73 year old male with severe left lower extremity pain with monophasic ABIs on the left and toe pressure of 0.  He is indicated for angiography with possible invention. Findings: Aorta and iliac segments are free of flow-limiting stenosis however the hypogastric artery on the left is occluded.  Renal arteries are occluded consistent with need for dialysis.  Bilateral common femoral arteries appear calcified however there is brisk flow with palpable pulses that are strong.  The left lower extremity is the site of interest.  Flow is  favorable through the profunda and sluggish into the SFA and the left SFA was then selected.  Above the adductor the SFA frankly occludes it was very difficult to determine reconstitution however the distal posterior tibial definitively reconstitutes and fills the medial and lateral plantar vessels.  We are unable to cross the heavily calcified lesion from above ultimately were able to cross retrograde and snared a through and through wire and perform shockwave lithotripsy followed by primary stenting with drug-eluting stent.  We performed balloon angioplasty of the posterior tibial artery for multiple areas of stenosis that were greater than 60% and these were reduced to 0% and at completion there was a systolic pressure of 160 mmHg at the ankle via the pedal sheath and strong signals after the pedal sheath was removed.  Procedure:  The patient was identified in the holding area and taken to room 8.  The patient was then placed supine on the table and prepped and draped in the usual sterile fashion.  A time out was called.  Ultrasound was used to evaluate the right common femoral artery.  There was circumferential calcium  however no flow-limiting stenosis and a strong pulse in the area was anesthetized and cannulated the right common femoral artery with micropuncture needle followed by wire and a sheath using direct ultrasound visualization and ultrasound images saved to the permanent record.  Concomitantly we administered fentanyl  and Versed  as moderate sedation and his vital signs were monitored by bedside nursing throughout the case.  We placed a Bentson wire followed by a 5 French sheath to the level of L1 and aortogram was performed followed by bilateral pelvic angiography including the bilateral common femoral arteries.  We then crossed the bifurcation and performed angiography of the left lower extremity initially from selection of the left common femoral artery and then moved this into the left SFA given  the brisk flow into the left profunda.  With the above findings we then placed the Glidewire advantage and a long 6 French sheath was placed into the left SFA and the patient was fully heparinized.  From above we attempted to cross the SFA occlusion and I was unable to cross this even using the back of a Glidewire given the dense calcifications.  The left medial ankle was then prepped and draped and I used ultrasound to cannulate the left posterior tibial artery and placed a micropuncture wire followed by sheath.  We perform retrograde angiography which demonstrated brisk flow retrograde however disease in the posterior tibial artery.  Using a V18 wire and a CXI catheter we are then able to get up to the area of occlusion of the SFA and using the back of the V18 wire we are able to get true lumen only into the SFA and snared an 014 wire through and through.  We then primarily ballooned using shockwave lithotripsy for a total of 400  pulses throughout the occluded SFA segment and completion angiography demonstrated improvement in flow however there remained calcification narrowing primarily stented with a 6 x 150 mm drug-eluting stent postdilated with a 5 mm balloon.  At this time flow was brisk through the stent and there was a ankle pressure of 80 mmHg.  There was approximately 10% in-stent stenosis but with brisk flow we elected no further intervention.  We then performed angiography below the knee and demonstrated the posterior tibial artery stenoses and these were ballooned to 0% residual stenosis with brisk flow and now an ankle pressure of 160 mmHg through the pedal sheath.  Satisfied with this we removed the through and through wire and then placed a Bentson wire and exchanged for 6 French sheath and deployed a minx device.  The patient tolerated the procedure without immediate complication.  Contrast: 100cc Brandon C. Sheree, MD [facility] Office: [phone] Pager:  [phone]    Medications:  anticoagulant sodium citrate       amLODipine   10 mg Oral QHS   aspirin  EC  81 mg Oral Daily   atorvastatin  10 mg Oral Daily   carvedilol   25 mg Oral BID WC   Chlorhexidine  Gluconate Cloth  6 each Topical Q0600   cinacalcet   90 mg Oral Q M,W,F-HD   clopidogrel  75 mg Oral Q breakfast   doxercalciferol   6 mcg Intravenous Q M,W,F-HD   heparin   5,000 Units Subcutaneous Q8H   insulin  aspart  0-6 Units Subcutaneous TID WC   sodium chloride  flush  3 mL Intravenous Q12H   sodium chloride  flush  3 mL Intravenous Q12H    Dialysis Orders: [facility] MWF EDW 68.1 kg 4 hrsF180 BFR 400 mL/ min AVF Profile 2 15 g needles Mircera 50 q 2 weeks Hectorol  6 q rx Sensipar  90 three times per week Sevelamer  4 tabs TID with meals Kayexelate 3x/ weekly  Assessment/Plan: 1 L critical limb ischemia: seen by [facility]. S/p aortogram arteriogram of LLE with shockwave lithotripsy, angioplasty and stenting of left SFA, balloon angioplasty of left lower extremity by [facility] 10/13. PT consulted and pharmacy following for lipid management. 2 ESRD: MWF. On HD.  3 Hypertension: reasonably well controlled on amlodipine  and carvedilol , UF as tolerated 4. Anemia of ESRD: Hgb 10.9, no ESA needs yet 5. Metabolic Bone Disease: sevelamer  as binder, sensipar  with HD as well as hectorol  6.  DM II: A1c pending, per primary 7.  HLD: to resume statin 8.  Dispo: Noted discharge order in place. Okay for discharge from a renal standpoint  Charmaine Piety, NP [facility] 05/25/2024,10:29 AM  LOS: 2 days

## 2024-05-25 NOTE — Progress Notes (Signed)
  Received patient in bed to unit.   Informed consent signed and in chart.    TX duration:3.5     Transported by  Hand-off given to patient's nurse.    Access used: rt AVF Access issues: None   Total UF removed: 800 Medication(s) given: None Post HD VS: 159/74      Hunter Hacking LPN [facility]

## 2024-05-26 NOTE — Progress Notes (Signed)
 Late Note Entry- May 26, 2024  Contacted Geron[facility] Car this morning to be advised that pt was d/c yesterday and should resume care on tomorrow.   Randine Mungo Dialysis Navigator [phone]

## 2024-05-27 NOTE — TOC Transition Note (Signed)
 Transition of Care - Initial Contact after Hospitalization  Date of discharge: 05/25/2024  Date of contact: 05/27/24  Method: Phone Spoke to: Patient  Patient contacted to discuss transition of care from recent inpatient hospitalization. Patient was admitted to [facility] from 10/12 to 10/15 discharge diagnosis of PAD; LLE ischemia.  The discharge medication list was reviewed. Patient understands the changes and has no concerns.   Patient getting HD now at [facility]. Next HD 10/20.  Charmaine Piety, NP

## 2024-06-03 ENCOUNTER — Other Ambulatory Visit: Payer: Self-pay

## 2024-06-03 DIAGNOSIS — I70222 Atherosclerosis of native arteries of extremities with rest pain, left leg: Secondary | ICD-10-CM

## 2024-06-03 DIAGNOSIS — I739 Peripheral vascular disease, unspecified: Secondary | ICD-10-CM

## 2024-06-07 ENCOUNTER — Encounter (HOSPITAL_COMMUNITY)

## 2024-06-08 ENCOUNTER — Other Ambulatory Visit: Payer: Self-pay | Admitting: Internal Medicine

## 2024-06-08 ENCOUNTER — Ambulatory Visit: Payer: Self-pay | Admitting: *Deleted

## 2024-06-08 NOTE — Telephone Encounter (Signed)
 FYI Only or Action Required?: Action required by provider: request for appointment, update on patient condition, and requesting alternative pain medication , tramadol  not effective.recommended UC/ED, hospital f/u appt 06/14/24.  Patient was last seen in primary care on 04/06/2024 by Norleen Lynwood ORN, MD.  Called Nurse Triage reporting Pain.  Symptoms began several days ago.  Interventions attempted: Prescription medications: tramadol .  Symptoms are: rapidly worsening.  Triage Disposition: See HCP Within 4 Hours (Or PCP Triage)  Patient/caregiver understands and will follow disposition?: Unsure   Please advise if earlier appt can be scheduled with PCP.         Copied from CRM #[id number]. Topic: Clinical - Red Word Triage >> Jun 08, 2024 11:42 AM Winona R wrote: Painful left leg with a sore on it, Pt states  can't walk says he has to  use the wheel chair. Admitted on 10/12 for Ischemic ulcer of ankle. Discharged on 10/22. Would like an appointment asap Reason for Disposition  [1] SEVERE pain (e.g., excruciating, unable to do any normal activities) AND [2] not improved after 2 hours of pain medicine  Answer Assessment - Initial Assessment Questions Hospital f/u appt scheduled 06/14/24. Patient reports he is still in severe pain and tramadol  is not effective. Reports left leg ankle wound feeling it is going to break. Recommended if pain not controlled go to UC/ED. Patient is in w/c and not able to walk. Patient requesting call back if earlier appt can be scheduled.       1. ONSET: When did the pain start?      Since 06/01/24 after discharge from hospital  2. LOCATION: Where is the pain located?      Left ankle area back of ankle  3. PAIN: How bad is the pain?    (Scale 1-10; or mild, moderate, severe)     Severe, stabbing pain 4. WORK OR EXERCISE: Has there been any recent work or exercise that involved this part of the body?      na 5. CAUSE: What do you think is causing  the leg pain?     Wound  6. OTHER SYMPTOMS: Do you have any other symptoms? (e.g., chest pain, back pain, breathing difficulty, swelling, rash, fever, numbness, weakness)     Severe pain left ankle area and can move leg but still pain  7. PREGNANCY: Is there any chance you are pregnant? When was your last menstrual period?     na  Protocols used: Leg Pain-A-AH

## 2024-06-09 NOTE — Telephone Encounter (Signed)
 Patient states that he will wait until the 4th to discuss this with Dr. Joshua. I offered him another appointment but he refused it.

## 2024-06-14 ENCOUNTER — Encounter: Payer: Self-pay | Admitting: Internal Medicine

## 2024-06-14 ENCOUNTER — Ambulatory Visit: Admitting: Internal Medicine

## 2024-06-14 ENCOUNTER — Ambulatory Visit: Admitting: Vascular Surgery

## 2024-06-14 VITALS — BP 118/74 | HR 82 | Temp 97.7°F | Ht 70.0 in | Wt 147.0 lb

## 2024-06-14 DIAGNOSIS — I70222 Atherosclerosis of native arteries of extremities with rest pain, left leg: Secondary | ICD-10-CM | POA: Diagnosis not present

## 2024-06-14 DIAGNOSIS — Z515 Encounter for palliative care: Secondary | ICD-10-CM | POA: Insufficient documentation

## 2024-06-14 DIAGNOSIS — L97329 Non-pressure chronic ulcer of left ankle with unspecified severity: Secondary | ICD-10-CM

## 2024-06-14 MED ORDER — OXYCODONE HCL 5 MG PO TABS: 5.0000 mg | ORAL_TABLET | Freq: Two times a day (BID) | ORAL | 0 refills | Status: AC | PRN

## 2024-06-14 NOTE — Progress Notes (Signed)
 Subjective:  Patient ID: [name], male    DOB: 06-16-1951  Age: 73 y.o. MRN: [id number]  CC: Hospitalization Follow-up   HPI LINC RENNE presents for f/up ---  Discussed the use of AI scribe software for clinical note transcription with the patient, who gave verbal consent to proceed.  History of Present Illness [name] is a 73 year old male with a history of kidney transplant and dialysis who presents with left foot pain and burning sensation.  He experiences a persistent burning pain on the left side of his foot, described as 'fire, fire'. The area was previously swollen and wet but is now dry, although the pain remains. The pain is severe enough to prevent him from putting his foot down since returning from the hospital three weeks ago.  He has a history of visiting a foot doctor who performed a procedure that initially worsened his condition. Subsequently, he was treated in the emergency department for a circulation problem, where a vein procedure was performed. Since then, he has been unable to bear weight on the affected foot due to pain.  He has been taking tramadol  for pain management, which provided some relief but was not entirely effective. He took tramadol  twice daily for eight days, but he has since run out of the medication. Tramadol  was more effective than Tylenol .  He is currently undergoing hemodialysis, which he feels has made him feel generally worse, experiencing symptoms such as cramps and significant weight loss. No shortness of breath is reported, but there is a history of breathlessness during sleep last year. He attributes some of his kidney issues to a COVID vaccine he received in March, which he believes affected his transplanted kidney, leading to a return to dialysis after a biopsy showed no significant findings.  He has not received a flu vaccine since the 1990s. He wants to avoid seeing multiple doctors and prefers to  manage his condition with minimal intervention.     Outpatient Medications Prior to Visit  Medication Sig Dispense Refill   amLODipine  (NORVASC ) 10 MG tablet Take 10 mg by mouth at bedtime.     aspirin  EC 81 MG tablet Take 1 tablet (81 mg total) by mouth daily. Swallow whole. 30 tablet 12   atorvastatin (LIPITOR) 10 MG tablet Take 1 tablet (10 mg total) by mouth daily. 90 tablet 2   carvedilol  (COREG ) 25 MG tablet Take 25 mg by mouth 2 (two) times daily with a meal.     cinacalcet  (SENSIPAR ) 30 MG tablet Take 2 tablets (60 mg total) by mouth every Monday, Wednesday, and Friday at 6 PM. 60 tablet 0   clopidogrel (PLAVIX) 75 MG tablet Take 1 tablet (75 mg total) by mouth daily with breakfast. 90 tablet 0   gabapentin  (NEURONTIN ) 100 MG capsule Take 100 mg by mouth daily as needed (nerve pain).     polyethylene glycol (MIRALAX  / GLYCOLAX ) 17 g packet Take 17 g by mouth daily. (Patient taking differently: Take 17 g by mouth daily as needed for moderate constipation.) 14 each 0   sevelamer  carbonate (RENVELA ) 800 MG tablet Take 4 tablets by mouth 3 (three) times daily with meals.     sodium polystyrene (KAYEXALATE) powder Take 30 g by mouth 2 (two) times a week. Thursdays Sundays     No facility-administered medications prior to visit.    ROS Review of Systems  Constitutional:  Positive for fatigue and unexpected weight  change (wt loss).  HENT: Negative.    Eyes: Negative.   Respiratory: Negative.  Negative for cough, chest tightness, shortness of breath and wheezing.   Cardiovascular:  Negative for chest pain, palpitations and leg swelling.  Gastrointestinal:  Negative for abdominal pain, diarrhea, nausea and vomiting.  Genitourinary: Negative.   Musculoskeletal:  Positive for gait problem. Negative for joint swelling.  Skin:  Positive for wound.  Hematological:  Negative for adenopathy. Does not bruise/bleed easily.  Psychiatric/Behavioral: Negative.      Objective:  BP 118/74    Pulse 82   Temp 97.7 F (36.5 C) (Temporal)   Ht 5' 10 (1.778 m)   Wt 147 lb (66.7 kg)   SpO2 99%   BMI 21.09 kg/m   BP Readings from Last 3 Encounters:  06/14/24 118/74  05/25/24 (!) 149/74  04/12/24 (!) 153/84    Wt Readings from Last 3 Encounters:  06/14/24 147 lb (66.7 kg)  05/23/24 153 lb 14.1 oz (69.8 kg)  04/12/24 154 lb 12.8 oz (70.2 kg)    Physical Exam Vitals reviewed.  Constitutional:      Appearance: He is ill-appearing (in a wheelchair).  HENT:     Mouth/Throat:     Mouth: Mucous membranes are moist.  Eyes:     General: No scleral icterus.    Conjunctiva/sclera: Conjunctivae normal.  Cardiovascular:     Rate and Rhythm: Normal rate and regular rhythm.     Heart sounds: No murmur heard.    No friction rub. No gallop.  Pulmonary:     Effort: Pulmonary effort is normal.     Breath sounds: No stridor. No wheezing, rhonchi or rales.  Abdominal:     General: Abdomen is flat.     Palpations: There is no mass.     Tenderness: There is no abdominal tenderness. There is no guarding.     Hernia: No hernia is present.  Musculoskeletal:     Cervical back: Neck supple.     Right lower leg: No edema.     Left lower leg: No edema.  Lymphadenopathy:     Cervical: No cervical adenopathy.  Skin:    General: Skin is warm.  Neurological:     Mental Status: He is alert. Mental status is at baseline.     Lab Results  Component Value Date   WBC 5.7 05/24/2024   HGB 10.9 (L) 05/24/2024   HCT 33.7 (L) 05/24/2024   PLT 192 05/24/2024   GLUCOSE 86 05/24/2024   CHOL 201 (H) 05/24/2024   TRIG 103 05/24/2024   HDL 55 05/24/2024   LDLDIRECT 75.0 05/18/2017   LDLCALC 125 (H) 05/24/2024   ALT 10 05/23/2024   AST 16 05/23/2024   NA 133 (L) 05/24/2024   K 4.8 05/24/2024   CL 93 (L) 05/24/2024   CREATININE 7.89 (H) 05/24/2024   BUN 41 (H) 05/24/2024   CO2 22 05/24/2024   TSH 1.57 03/22/2024   PSA 1.07 03/12/2021   INR 1.1 05/22/2024   HGBA1C 4.7 (L) 05/23/2024     PERIPHERAL VASCULAR CATHETERIZATION Result Date: 05/23/2024 Patient name: [name] MRN: [id number] DOB: Jul 07, 1951 Sex: male 05/23/2024 Pre-operative Diagnosis: 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 Sheree, MD Procedure Performed: 1.  Percutaneous ultrasound-guided cannulation and Mynx device closure right common femoral artery 2.  Catheter selection of aorta and aortogram with bilateral lower extremity angiography 3.  Catheter selection of left  SFA 4.  Shockwave lithotripsy left SFA with 5 x 80 mm balloon for 400 pulses followed by stenting of left SFA with 6 x 150 mm Eluvia postdilated with 5 mm balloon 5.  Ultrasound-guided cannulation left posterior tibial artery 6.  Plain balloon angioplasty left posterior tibial artery with 3 x 150 mm balloon 7.  Moderate sedation with fentanyl  and Versed  for 99 minutes Indications: 73 year old male with severe left lower extremity pain with monophasic ABIs on the left and toe pressure of 0.  He is indicated for angiography with possible invention. Findings: Aorta and iliac segments are free of flow-limiting stenosis however the hypogastric artery on the left is occluded.  Renal arteries are occluded consistent with need for dialysis.  Bilateral common femoral arteries appear calcified however there is brisk flow with palpable pulses that are strong.  The left lower extremity is the site of interest.  Flow is favorable through the profunda and sluggish into the SFA and the left SFA was then selected.  Above the adductor the SFA frankly occludes it was very difficult to determine reconstitution however the distal posterior tibial definitively reconstitutes and fills the medial and lateral plantar vessels.  We are unable to cross the heavily calcified lesion from above ultimately were able to cross retrograde and snared a through and through wire and perform  shockwave lithotripsy followed by primary stenting with drug-eluting stent.  We performed balloon angioplasty of the posterior tibial artery for multiple areas of stenosis that were greater than 60% and these were reduced to 0% and at completion there was a systolic pressure of 160 mmHg at the ankle via the pedal sheath and strong signals after the pedal sheath was removed.  Procedure:  The patient was identified in the holding area and taken to room 8.  The patient was then placed supine on the table and prepped and draped in the usual sterile fashion.  A time out was called.  Ultrasound was used to evaluate the right common femoral artery.  There was circumferential calcium  however no flow-limiting stenosis and a strong pulse in the area was anesthetized and cannulated the right common femoral artery with micropuncture needle followed by wire and a sheath using direct ultrasound visualization and ultrasound images saved to the permanent record.  Concomitantly we administered fentanyl  and Versed  as moderate sedation and his vital signs were monitored by bedside nursing throughout the case.  We placed a Bentson wire followed by a 5 French sheath to the level of L1 and aortogram was performed followed by bilateral pelvic angiography including the bilateral common femoral arteries.  We then crossed the bifurcation and performed angiography of the left lower extremity initially from selection of the left common femoral artery and then moved this into the left SFA given the brisk flow into the left profunda.  With the above findings we then placed the Glidewire advantage and a long 6 French sheath was placed into the left SFA and the patient was fully heparinized.  From above we attempted to cross the SFA occlusion and I was unable to cross this even using the back of a Glidewire given the dense calcifications.  The left medial ankle was then prepped and draped and I used ultrasound to cannulate the left posterior  tibial artery and placed a micropuncture wire followed by sheath.  We perform retrograde angiography which demonstrated brisk flow retrograde however disease in the posterior tibial artery.  Using a V18 wire and a CXI catheter we are then able  to get up to the area of occlusion of the SFA and using the back of the V18 wire we are able to get true lumen only into the SFA and snared an 014 wire through and through.  We then primarily ballooned using shockwave lithotripsy for a total of 400 pulses throughout the occluded SFA segment and completion angiography demonstrated improvement in flow however there remained calcification narrowing primarily stented with a 6 x 150 mm drug-eluting stent postdilated with a 5 mm balloon.  At this time flow was brisk through the stent and there was a ankle pressure of 80 mmHg.  There was approximately 10% in-stent stenosis but with brisk flow we elected no further intervention.  We then performed angiography below the knee and demonstrated the posterior tibial artery stenoses and these were ballooned to 0% residual stenosis with brisk flow and now an ankle pressure of 160 mmHg through the pedal sheath.  Satisfied with this we removed the through and through wire and then placed a Bentson wire and exchanged for 6 French sheath and deployed a minx device.  The patient tolerated the procedure without immediate complication.  Contrast: 100cc Brandon C. Sheree, MD [facility] Office: [phone] Pager: [phone]   VAS US  LOWER EXTREMITY VENOUS (DVT) (7a-7p) Result Date: 05/22/2024  Lower Venous DVT Study Patient Name:  SHANTE MAYSONET  Date of Exam:   05/22/2024 Medical Rec #: [id number]          Accession #:    [id number] Date of Birth: 1951/01/03          Patient Gender: M Patient Age:   13 years Exam Location:  [facility] Procedure:      VAS US  LOWER EXTREMITY VENOUS (DVT) Referring Phys: HAMP FONDAW  --------------------------------------------------------------------------------  Indications: Pain. Patient had recent procedure for ingrown left great toe with Podiatry 04/20/24. Now, with ischemic type pain in left lower leg and foot.  Limitations: Shadowing from arterial calcification. Comparison Study: No prior left LEV on file Performing Technologist: Alberta Lis RVS  Examination Guidelines: A complete evaluation includes B-mode imaging, spectral Doppler, color Doppler, and power Doppler as needed of all accessible portions of each vessel. Bilateral testing is considered an integral part of a complete examination. Limited examinations for reoccurring indications may be performed as noted. The reflux portion of the exam is performed with the patient in reverse Trendelenburg.  +-----+---------------+---------+-----------+----------+--------------+ RIGHTCompressibilityPhasicitySpontaneityPropertiesThrombus Aging +-----+---------------+---------+-----------+----------+--------------+ CFV  Full           Yes      Yes                                 +-----+---------------+---------+-----------+----------+--------------+ SFJ  Full                                                        +-----+---------------+---------+-----------+----------+--------------+   +---------+---------------+---------+-----------+----------+--------------+ LEFT     CompressibilityPhasicitySpontaneityPropertiesThrombus Aging +---------+---------------+---------+-----------+----------+--------------+ CFV      Full           Yes      Yes                                 +---------+---------------+---------+-----------+----------+--------------+ SFJ  Full                                                        +---------+---------------+---------+-----------+----------+--------------+ FV Prox  Full                                                         +---------+---------------+---------+-----------+----------+--------------+ FV Mid   Full                                                        +---------+---------------+---------+-----------+----------+--------------+ FV DistalFull                                                        +---------+---------------+---------+-----------+----------+--------------+ PFV      Full                                                        +---------+---------------+---------+-----------+----------+--------------+ POP      Full           Yes      Yes                                 +---------+---------------+---------+-----------+----------+--------------+ PTV      Full                                                        +---------+---------------+---------+-----------+----------+--------------+ PERO     Full                                                        +---------+---------------+---------+-----------+----------+--------------+ Gastroc  Full                                                        +---------+---------------+---------+-----------+----------+--------------+     Summary: RIGHT: - No evidence of common femoral vein obstruction.  - Incidentally, heavy calcification noted in the CFA and FA  LEFT: - There is no evidence of deep vein thrombosis in the lower extremity.  - No cystic structure found in the popliteal fossa. - Incidentally, heavy calcification noted throughout the left lower  extremity arterial system  *See table(s) above for measurements and observations. Electronically signed by Gaile New MD on 05/22/2024 at 8:46:14 PM.    Final    VAS US  ABI WITH/WO TBI Result Date: 05/22/2024  LOWER EXTREMITY DOPPLER STUDY Patient Name:  [name]  Date of Exam:   05/22/2024 Medical Rec #: [id number]          Accession #:    [id number] Date of Birth: September 28, 1950          Patient Gender: M Patient Age:   32 years Exam Location:  [facility] Procedure:      VAS US  ABI WITH/WO TBI Referring Phys: HAMP FONDAW --------------------------------------------------------------------------------  Indications: Claudication, rest pain, and peripheral artery disease. High Risk Factors: Hypertension, hyperlipidemia, Diabetes, no history of                    smoking. Other Factors: ESRD on dialysis, failed kidney transplant,.  Limitations: Today's exam was limited due to involuntary patient movement and              patient unable to extend leg secondary to rest pain. Comparison Study: Prior ABI done 03/18/21 Performing Technologist: Rachel Pellet RVS  Examination Guidelines: A complete evaluation includes at minimum, Doppler waveform signals and systolic blood pressure reading at the level of bilateral brachial, anterior tibial, and posterior tibial arteries, when vessel segments are accessible. Bilateral testing is considered an integral part of a complete examination. Photoelectric Plethysmograph (PPG) waveforms and toe systolic pressure readings are included as required and additional duplex testing as needed. Limited examinations for reoccurring indications may be performed as noted.  ABI Findings: +---------+------------------+-----+-----------+-------------------+ Right    Rt Pressure (mmHg)IndexWaveform   Comment             +---------+------------------+-----+-----------+-------------------+ Brachial                                   Restricted/dialysis +---------+------------------+-----+-----------+-------------------+ PTA      254               1.46 multiphasic                    +---------+------------------+-----+-----------+-------------------+ DP       254               1.46 multiphasic                    +---------+------------------+-----+-----------+-------------------+ Great Toe61                0.35 Normal                         +---------+------------------+-----+-----------+-------------------+  +---------+------------------+-----+-------------------+-------+ Left     Lt Pressure (mmHg)IndexWaveform           Comment +---------+------------------+-----+-------------------+-------+ Brachial 174                    triphasic                  +---------+------------------+-----+-------------------+-------+ PTA      254               1.46 dampened monophasic        +---------+------------------+-----+-------------------+-------+ DP       254               1.46 dampened monophasic        +---------+------------------+-----+-------------------+-------+  Great Toe0                 0.00 Absent                     +---------+------------------+-----+-------------------+-------+ +-------+---------------------+-----------+------------+------------+ ABI/TBIToday's ABI          Today's TBIPrevious ABIPrevious TBI +-------+---------------------+-----------+------------+------------+ Right  1.46/non compressible0.35       0.94        0.52         +-------+---------------------+-----------+------------+------------+ Left   1.46/non compressibleabsent     1.06        0.54         +-------+---------------------+-----------+------------+------------+ Arterial wall calcification precludes accurate ankle pressures and ABIs. Bilateral TBIs appear decreased compared to prior study on 03/18/21. Bilateral ABIs appear calcified compared to prior study on 03/18/21.  Summary: Right: Resting right ankle-brachial index indicates noncompressible right lower extremity arteries. The right toe-brachial index is abnormal.  Left: Resting left ankle-brachial index indicates noncompressible left lower extremity arteries, however, waveforms are severely dampened. The left toe-brachial index is abnormal.  *See table(s) above for measurements and observations.  Suggest Peripheral Vascular Consult. Electronically signed by Gaile New MD on 05/22/2024 at 8:45:19 PM.    Final     Assessment & Plan:   Encounter for palliative care involving management of pain -     oxyCODONE  HCl; Take 1 tablet (5 mg total) by mouth 2 (two) times daily as needed for severe pain (pain score 7-10).  Dispense: 60 tablet; Refill: 0  Ischemic ulcer of left ankle, unspecified ulcer stage (HCC) -     oxyCODONE  HCl; Take 1 tablet (5 mg total) by mouth 2 (two) times daily as needed for severe pain (pain score 7-10).  Dispense: 60 tablet; Refill: 0  Critical limb ischemia of left lower extremity (HCC)- Wounds have granulated. Will control the pain.     Follow-up: Return if symptoms worsen or fail to improve.  [name] Molt, MD

## 2024-06-14 NOTE — Patient Instructions (Signed)
 Oxycodone  Capsules or Tablets What is this medication? OXYCODONE  (ox i KOE done) treats severe pain. It is prescribed when other pain medications do not work well enough or cannot be tolerated. It works by blocking pain signals in the brain. It belongs to a group of medications called opioids. This medicine may be used for other purposes; ask your health care provider or pharmacist if you have questions. COMMON BRAND NAME(S): Dazidox, Endocodone, Oxaydo , OXECTA, OxyIR, Percolone, Roxicodone , Roxybond  What should I tell my care team before I take this medication? They need to know if you have any of these conditions: Brain tumor Frequently drink alcohol Head injury Heart disease History of pancreatitis Kidney disease Liver disease Low adrenal gland function Lung or breathing disease, such as asthma Seizures Stomach or intestine problems Substance use disorder Taken an MAOI such as Marplan, Nardil, or Parnate in the last 14 days An unusual or allergic reaction to oxycodone , other medications, foods, dyes, or preservatives Pregnant or trying to get pregnant Breastfeeding How should I use this medication? Take this medication by mouth with water . Take it as directed on the prescription label at the same time every day. You can take it with or without food. If it upsets your stomach, take it with food. Keep taking it unless your care team tells you to stop. Some brands of this medication, like Oxaydo , have special instructions. Ask your care team or pharmacist if these directions are for you: Do not cut, crush or chew this medication. Do not wet, soak, or lick the tablet before you take it. A special MedGuide will be given to you by the pharmacist with each prescription and refill. Be sure to read this information carefully each time. Talk to your care team regarding the use of this medication in children. Special care may be needed. Overdosage: If you think you have taken too much of this  medicine contact a poison control center or emergency room at once. NOTE: This medicine is only for you. Do not share this medicine with others. What if I miss a dose? If you miss a dose, take it as soon as you can. If it is almost time for your next dose, take only that dose. Do not take double or extra doses. What may interact with this medication? Do not take this medication with any of the following: Safinamide This medication may interact with the following: Alcohol Antihistamines for allergy, cough, and cold Atropine Certain antivirals for HIV or hepatitis Certain antibiotics, such as clarithromycin, erythromycin, linezolid, rifampin Certain medications for anxiety or sleep Certain medications for bladder problems, such as oxybutynin, tolterodine Certain medications for depression, such as amitriptyline, fluoxetine, sertraline Certain medications for fungal infections, such as ketoconazole , itraconazole, posaconazole Certain medications for migraine headache, such as almotriptan, eletriptan, frovatriptan, naratriptan, rizatriptan, sumatriptan, zolmitriptan Certain medications for nausea or vomiting, such as dolasetron, granisetron, ondansetron , palonosetron Certain medications for Parkinson disease, such as benztropine, trihexyphenidyl Certain medications for seizures, such as carbamazepine, phenobarbital, phenytoin, primidone Certain medications for stomach problems, such as dicyclomine, hyoscyamine Certain medications for travel sickness, such as scopolamine Diuretics General anesthetics, such as halothane, isoflurane, methoxyflurane, propofol  Ipratropium MAOIs, such as Marplan, Nardil, and Parnate Medications that relax muscles Methylene blue Other opioid medications for pain or cough Phenothiazines, such as chlorpromazine, mesoridazine, prochlorperazine, thioridazine This list may not describe all possible interactions. Give your health care provider a list of all the  medicines, herbs, non-prescription drugs, or dietary supplements you use. Also tell them if you smoke,  drink alcohol, or use illegal drugs. Some items may interact with your medicine. What should I watch for while using this medication? Tell your care team if your pain does not go away, if it gets worse, or if you have new or a different type of pain. You may develop tolerance to this medication. Tolerance means that you will need a higher dose of the medication for pain relief. Tolerance is normal and is expected if you take this medication for a long time. Taking this medication with other substances that cause drowsiness, such as alcohol, benzodiazepines, or other opioids can cause serious side effects. Give your care team a list of all medications you use. They will tell you how much medication to take. Do not take more medication than directed. Call emergency services if you have problems breathing or staying awake. Long term use of this medication may cause your brain and body to depend on it. This can happen even when used as directed by your care team. You and your care team will work together to determine how long you will need to take this medication. If your care team wants you to stop this medication, the dose will be slowly lowered over time to reduce the risk of side effects. Naloxone is an emergency medication used for an opioid overdose. An overdose can happen if you take too much of an opioid. It can also happen if an opioid is taken with some other medications or substances such as alcohol. Know the symptoms of an overdose, such as trouble breathing, unusually tired or sleepy, or not being able to respond or wake up. Make sure to tell caregivers and close contacts where your naloxone is stored. Make sure they know how to use it. After naloxone is given, the person giving it must call emergency services. Naloxone is a temporary treatment. Repeat doses may be needed. This medication may affect  your coordination, reaction time, or judgment. Do not drive or operate machinery until you know how this medication affects you. Sit up or stand slowly to reduce the risk of dizzy or fainting spells. Drinking alcohol with this medication can increase the risk of these side effects. This medication will cause constipation. If you do not have a bowel movement for 3 days, call your care team. Your mouth may get dry. Chewing sugarless gum or sucking hard candy and drinking plenty of water  may help. Contact your care team if the problem does not go away or is severe. Talk to your care team if you may be pregnant. Prolonged use of this medication during pregnancy can cause temporary withdrawal in a newborn. Talk to your care team before breastfeeding. Changes to your treatment plan may be needed. If you breastfeed while taking this medication, seek medical care right away if you notice the child has slow or noisy breathing, is unusually sleepy or not able to wake up, or is limp. Long-term use of this medication may cause infertility. Talk to your care team if you are concerned about your fertility. What side effects may I notice from receiving this medication? Side effects that you should report to your care team as soon as possible: Allergic reactions--skin rash, itching, hives, swelling of the face, lips, tongue, or throat CNS depression--slow or shallow breathing, shortness of breath, feeling faint, dizziness, confusion, difficulty staying awake Low adrenal gland function--nausea, vomiting, loss of appetite, unusual weakness or fatigue, dizziness Low blood pressure--dizziness, feeling faint or lightheaded, blurry vision Side effects that usually do not  require medical attention (report to your care team if they continue or are bothersome): Constipation Dizziness Drowsiness Dry mouth Headache Nausea Vomiting This list may not describe all possible side effects. Call your doctor for medical advice  about side effects. You may report side effects to FDA at 1-800-FDA-1088. Where should I keep my medication? Keep this medication out of reach of children and pets. Store it out of sight in a safe place. Do not share it with others. Misuse of this medication is dangerous and against the law. Store at room temperature between 20 and 25 degrees C (68 and 77 degrees F). Protect from light and moisture. Keep the container tightly closed. Get rid of any unused medication after the expiration date. This medication may cause harm and death if it is taken by other adults, children, or pets. It is important to get rid of the medication as soon as you no longer need it or it is expired. To get rid of this medication: Take the medication to a take-back program. Check with your pharmacy or law enforcement to find a location. Follow the steps given to you by your pharmacy. You may be given a pre-paid mail-back envelope or disposal product to safely get rid of your medication. If other options are not available, flush the medication down the toilet. NOTE: This sheet is a summary. It may not cover all possible information. If you have questions about this medicine, talk to your doctor, pharmacist, or health care provider.  2025 Elsevier/Gold Standard (2023-07-15 00:00:00)

## 2024-06-30 NOTE — Progress Notes (Signed)
 Office Note     CC:  follow up Requesting Provider:  Joshua [name] CROME, MD  HPI: [name] is a 73 y.o. (03-Jul-1951) male who presents for follow up of PAD. He is s/p Aortogram, arteriogram of LLE with Shockwave lithotripsy left SFA with 5 x 80 mm balloon for 400 pulses followed by stenting of left SFA with 6 x 150 mm Eluvia postdilated with 5 mm balloon; Ultrasound-guided cannulation left posterior tibial artery with Plain balloon angioplasty left posterior tibial artery with 3 x 150 mm balloon on 05/23/24 by Dr. Sheree. This was for rest pain with Achilles tendon ulceration. Post intervention he continued to have some pain in left foot but adequate perfusion  Today he reports that he is still having pain in left foot. Describes it as a sort of tightness and pressure in the foot. Says the foot is very sensitive to the touch. He is doing only very minimal weight bearing on the left foot. Says he has not been able to walk on it yet because it is too sensitive. He does feel it is getting better. He is slowly exercising it and the sensitivity is getting less and less. Feels also that he is hopeful to be able to start to stand on it and walk int he next coming weeks. He had previously seen podiatry before his recent hospitalization and says he does not have any interest in returning to see them. He is okay taking care of his achilles wound himself. He is just keeping it dry. He says he would like nature to just take its course. He is compliant with his Aspirin , Statin and Plavix .   Past Medical History:  Diagnosis Date   Anemia    d/t ESRD   Arthritis    Benign colon polyp 08/11/2010   CHF (congestive heart failure) (HCC)    Clot    STATED TOLD IN APRIL CLOT TO LEFT SHOULDER. 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   HTN (hypertension)    Hyperlipidemia    Need for prophylactic vaccination and inoculation against varicella 12/26/2021   Need for prophylactic vaccination with combined diphtheria-tetanus-pertussis (DTP) vaccine 12/26/2021   Neuromuscular disorder (HCC)    neuropathy down LLE   Pneumonia    Seizures (HCC)     Past Surgical History:  Procedure Laterality Date   ABDOMINAL AORTOGRAM N/A 05/23/2024   Procedure: ABDOMINAL AORTOGRAM;  Surgeon: Sheree Penne Bruckner, MD;  Location: [facility] INVASIVE CV LAB;  Service: Cardiovascular;  Laterality: N/A;   AV FISTULA PLACEMENT  01/19/2012   Procedure: ARTERIOVENOUS (AV) FISTULA CREATION;  Surgeon: Redell CROME Door, MD;  Location: [facility] OR;  Service: Vascular;  Laterality: Right;  Right Brachio-cephalic arteriovenous fistula   AV FISTULA PLACEMENT Right 07/18/2021   Procedure: INSERTION OF RIGHT UPPER EXTREMITY ARTERIOVENOUS (AV) GORE-TEX GRAFT;  Surgeon: Magda [name] SAILOR, MD;  Location: [facility] OR;  Service: Vascular;  Laterality: Right;  PERIPHERAL NERVE BLOCK   AV FISTULA PLACEMENT, RADIOCEPHALIC  12/31/10   Right arm   INSERTION OF DIALYSIS CATHETER  01/19/2012   Procedure: INSERTION OF DIALYSIS CATHETER;  Surgeon: Redell CROME Door, MD;  Location: [facility] OR;  Service: Vascular;  Laterality: N/A;  right internal jugular vein   LOWER EXTREMITY ANGIOGRAPHY N/A  05/23/2024   Procedure: Lower Extremity Angiography;  Surgeon: Sheree Penne Bruckner, MD;  Location[facility] INVASIVE CV LAB;  Service: Cardiovascular;  Laterality: N/A;   LOWER EXTREMITY INTERVENTION Left 05/23/2024   Procedure: LOWER EXTREMITY INTERVENTION;  Surgeon: Sheree Penne Bruckner, MD;  Location: [facility] INVASIVE CV LAB;  Service: Cardiovascular;  Laterality: Left;   TRANSESOPHAGEAL ECHOCARDIOGRAM (CATH LAB) N/A 09/23/2023   Procedure: TRANSESOPHAGEAL ECHOCARDIOGRAM;  Surgeon: Santo Stanly LABOR, MD;  Location: [facility] INVASIVE CV LAB;  Service: Cardiovascular;  Laterality: N/A;   TRANSPLANTATION  RENAL  04/30/2014   UPPER EXTREMITY VENOGRAPHY Right 06/11/2021   Procedure: UPPER EXTREMITY VENOGRAPHY;  Surgeon: Serene Gaile ORN, MD;  Location: [facility] INVASIVE CV LAB;  Service: Cardiovascular;  Laterality: Right;   VENOGRAM Left 05/30/2021   Procedure: DIAGNOSTIC CENTRAL VENOGRAM;  Surgeon: Lanis Fonda BRAVO, MD;  Location: [facility] OR;  Service: Vascular;  Laterality: Left;    Social History   Socioeconomic History   Marital status: Married    Spouse name: cordellia   Number of children: 5   Years of education: Not on file   Highest education level: Doctorate  Occupational History   Occupation: PhD Garment/textile Technologist: [employer]  Tobacco Use   Smoking status: Never    Passive exposure: Never   Smokeless tobacco: Never  Vaping Use   Vaping status: Never Used  Substance and Sexual Activity   Alcohol use: Yes    Alcohol/week: 1.0 standard drink of alcohol    Types: 1 Shots of liquor per week    Comment: occasionally   Drug use: No   Sexual activity: Yes    Birth control/protection: Condom  Other Topics Concern   Not on file  Social History Narrative   Regular Exercise -  YES         Social Drivers of Health   Financial Resource Strain: Low Risk  (03/30/2024)   Overall Financial Resource Strain (CARDIA)    Difficulty of Paying Living Expenses: Not hard at all  Food Insecurity: No Food Insecurity (05/22/2024)   Hunger Vital Sign    Worried About Running Out of Food in the Last Year: Never true    Ran Out of Food in the Last Year: Never true  Transportation Needs: No Transportation Needs (05/22/2024)   PRAPARE - Administrator, Civil Service (Medical): No    Lack of Transportation (Non-Medical): No  Physical Activity: Insufficiently Active (03/30/2024)   Exercise Vital Sign    Days of Exercise per Week: 3 days    Minutes of Exercise per Session: 30 min  Stress: No Stress Concern Present (03/30/2024)   [employer] of Occupational Health - Occupational  Stress Questionnaire    Feeling of Stress: Only a little  Social Connections: Socially Integrated (05/22/2024)   Social Connection and Isolation Panel    Frequency of Communication with Friends and Family: More than three times a week    Frequency of Social Gatherings with Friends and Family: More than three times a week    Attends Religious Services: More than 4 times per year    Active Member of [employer] or Organizations: Yes    Attends Engineer, Structural: More than 4 times per year    Marital Status: Married  Catering Manager Violence: Not At Risk (05/22/2024)   Humiliation, Afraid, Rape, and Kick questionnaire    Fear of Current or Ex-Partner: No    Emotionally Abused: No    Physically Abused: No    Sexually  Abused: No    Family History  Problem Relation Age of Onset   Diabetes Father    Colon cancer Neg Hx    Esophageal cancer Neg Hx    Stomach cancer Neg Hx    Rectal cancer Neg Hx     Current Outpatient Medications  Medication Sig Dispense Refill   amLODipine  (NORVASC ) 10 MG tablet Take 10 mg by mouth at bedtime.     aspirin  EC 81 MG tablet Take 1 tablet (81 mg total) by mouth daily. Swallow whole. 30 tablet 12   atorvastatin  (LIPITOR) 10 MG tablet Take 1 tablet (10 mg total) by mouth daily. 90 tablet 2   carvedilol  (COREG ) 25 MG tablet Take 25 mg by mouth 2 (two) times daily with a meal.     cinacalcet  (SENSIPAR ) 30 MG tablet Take 2 tablets (60 mg total) by mouth every Monday, Wednesday, and Friday at 6 PM. 60 tablet 0   clopidogrel  (PLAVIX ) 75 MG tablet Take 1 tablet (75 mg total) by mouth daily with breakfast. 90 tablet 0   gabapentin  (NEURONTIN ) 100 MG capsule Take 100 mg by mouth daily as needed (nerve pain).     oxyCODONE  (OXY IR/ROXICODONE ) 5 MG immediate release tablet Take 1 tablet (5 mg total) by mouth 2 (two) times daily as needed for severe pain (pain score 7-10). 60 tablet 0   polyethylene glycol (MIRALAX  / GLYCOLAX ) 17 g packet Take 17 g by mouth  daily. (Patient taking differently: Take 17 g by mouth daily as needed for moderate constipation.) 14 each 0   sevelamer  carbonate (RENVELA ) 800 MG tablet Take 4 tablets by mouth 3 (three) times daily with meals.     sodium polystyrene (KAYEXALATE) powder Take 30 g by mouth 2 (two) times a week. Thursdays Sundays     No current facility-administered medications for this visit.    No Known Allergies   REVIEW OF SYSTEMS:  Negative unless noted in HPI [X]  denotes positive finding, [ ]  denotes negative finding Cardiac  Comments:  Chest pain or chest pressure:    Shortness of breath upon exertion:    Short of breath when lying flat:    Irregular heart rhythm:        Vascular    Pain in calf, thigh, or hip brought on by ambulation:    Pain in feet at night that wakes you up from your sleep:     Blood clot in your veins:    Leg swelling:         Pulmonary    Oxygen at home:    Productive cough:     Wheezing:         Neurologic    Sudden weakness in arms or legs:     Sudden numbness in arms or legs:     Sudden onset of difficulty speaking or slurred speech:    Temporary loss of vision in one eye:     Problems with dizziness:         Gastrointestinal    Blood in stool:     Vomited blood:         Genitourinary    Burning when urinating:     Blood in urine:        Psychiatric    Major depression:         Hematologic    Bleeding problems:    Problems with blood clotting too easily:        Skin    Rashes or ulcers:  Constitutional    Fever or chills:      PHYSICAL EXAMINATION:  Vitals:   07/04/24 1330  BP: 137/75  Pulse: 77  Temp: 97.7 F (36.5 C)  TempSrc: Temporal  Weight: 147 lb (66.7 kg)    General:  WDWN in NAD; vital signs documented above Gait: Not observed HENT: WNL, normocephalic Pulmonary: normal non-labored breathing Cardiac: regular HR Abdomen: soft Vascular Exam/Pulses: 2+ femoral pulses, no distal pulses palpable. Right PT, left Dp  and peroneal doppler signals Extremities: with ischemic changes to left heel, without Gangrene , without cellulitis; without open wounds; scaly skin around toes on left foot. Left achilles tendon eschar as shown below    Musculoskeletal: no muscle wasting or atrophy  Neurologic: A&O X 3 Psychiatric:  The pt has Normal affect.   Non-Invasive Vascular Imaging:   +-------+-----------+-----------+------------+------------+  ABI/TBIToday's ABIToday's TBIPrevious ABIPrevious TBI  +-------+-----------+-----------+------------+------------+  Right [place]         0.31       [place]          0.35          +-------+-----------+-----------+------------+------------+  Left  [place]         0.42                 absent        +-------+-----------+-----------+------------+------------+   VAS US  Lower extremity arterial duplex: Summary:  Left: Patent superficial femoral artery stent without significant  stenosis.  Patent posterior tibial artery without significant stenosis. Calcified vessels observed throughout.   ASSESSMENT/PLAN:: 73 y.o. male here for follow up of PAD. He is s/p Aortogram, arteriogram of LLE with Shockwave lithotripsy left SFA with 5 x 80 mm balloon for 400 pulses followed by stenting of left SFA with 6 x 150 mm Eluvia postdilated with 5 mm balloon; Ultrasound-guided cannulation left posterior tibial artery with Plain balloon angioplasty left posterior tibial artery with 3 x 150 mm balloon on 05/23/24 by Dr. Sheree. This was for rest pain with Achilles tendon ulceration. His rest pain is resolved. Still has some sensitivity in left foot but this is improving. The achilles tendon ulceration is overall stable. Dry eschar present.  -His ABI shows improvement on the left, Right essentially unchanged.  -Duplex shows patient left SFA stents and PT is patent. On exam he has brisk PT doppler signal, faint peroneal on the left. He is not interested in wound care or Podiatry referral or  management for his achilles wound.  - Continue Aspirin , Statin, Plavix  - follow up in 3 months with repeat LLE arterial duplex and ABI -He knows to call for earlier follow up if any issues or concerns   Teretha Damme, PA-C [facility] [phone]  On call MD:   Gretta

## 2024-07-04 ENCOUNTER — Ambulatory Visit (HOSPITAL_COMMUNITY)
Admission: RE | Admit: 2024-07-04 | Discharge: 2024-07-04 | Disposition: A | Discharge status: Home / Self Care | Source: Ambulatory Visit | Attending: Surgery | Admitting: Surgery

## 2024-07-04 ENCOUNTER — Telehealth: Payer: Self-pay

## 2024-07-04 ENCOUNTER — Ambulatory Visit (HOSPITAL_BASED_OUTPATIENT_CLINIC_OR_DEPARTMENT_OTHER)
Admit: 2024-07-04 | Discharge: 2024-07-04 | Disposition: A | Discharge status: Home / Self Care | Attending: Surgery | Admitting: Surgery

## 2024-07-04 ENCOUNTER — Ambulatory Visit (INDEPENDENT_AMBULATORY_CARE_PROVIDER_SITE_OTHER): Admitting: Physician Assistant

## 2024-07-04 VITALS — BP 137/75 | HR 77 | Temp 97.7°F | Wt 147.0 lb

## 2024-07-04 DIAGNOSIS — I739 Peripheral vascular disease, unspecified: Secondary | ICD-10-CM | POA: Insufficient documentation

## 2024-07-04 DIAGNOSIS — I70222 Atherosclerosis of native arteries of extremities with rest pain, left leg: Secondary | ICD-10-CM | POA: Diagnosis not present

## 2024-07-04 DIAGNOSIS — L97321 Non-pressure chronic ulcer of left ankle limited to breakdown of skin: Secondary | ICD-10-CM | POA: Diagnosis not present

## 2024-07-04 LAB — VAS US ABI WITH/WO TBI

## 2024-07-04 NOTE — Telephone Encounter (Signed)
 Please advise

## 2024-07-04 NOTE — Telephone Encounter (Signed)
 Copied from CRM [id number]. Topic: Referral - Status >> Jul 04, 2024 11:19 AM Thersia BROCKS wrote: Reason for CRM: Patient called in regarding physical therapy referral, states he needs a new referral sent to them to be seen

## 2024-07-06 ENCOUNTER — Other Ambulatory Visit: Payer: Self-pay

## 2024-07-06 DIAGNOSIS — I739 Peripheral vascular disease, unspecified: Secondary | ICD-10-CM

## 2024-07-20 ENCOUNTER — Ambulatory Visit: Admitting: Neurology

## 2024-08-15 ENCOUNTER — Ambulatory Visit (INDEPENDENT_AMBULATORY_CARE_PROVIDER_SITE_OTHER): Admitting: Internal Medicine

## 2024-08-15 ENCOUNTER — Ambulatory Visit: Payer: Self-pay | Admitting: Internal Medicine

## 2024-08-15 ENCOUNTER — Encounter: Payer: Self-pay | Admitting: Internal Medicine

## 2024-08-15 ENCOUNTER — Ambulatory Visit (INDEPENDENT_AMBULATORY_CARE_PROVIDER_SITE_OTHER)

## 2024-08-15 VITALS — BP 142/78 | HR 85 | Temp 97.9°F | Resp 16 | Ht 70.0 in | Wt 163.0 lb

## 2024-08-15 DIAGNOSIS — R61 Generalized hyperhidrosis: Secondary | ICD-10-CM | POA: Insufficient documentation

## 2024-08-15 DIAGNOSIS — R052 Subacute cough: Secondary | ICD-10-CM | POA: Diagnosis not present

## 2024-08-15 DIAGNOSIS — I10 Essential (primary) hypertension: Secondary | ICD-10-CM

## 2024-08-15 DIAGNOSIS — R0781 Pleurodynia: Secondary | ICD-10-CM | POA: Insufficient documentation

## 2024-08-15 DIAGNOSIS — E119 Type 2 diabetes mellitus without complications: Secondary | ICD-10-CM | POA: Diagnosis not present

## 2024-08-15 DIAGNOSIS — L97329 Non-pressure chronic ulcer of left ankle with unspecified severity: Secondary | ICD-10-CM

## 2024-08-15 DIAGNOSIS — Z0000 Encounter for general adult medical examination without abnormal findings: Secondary | ICD-10-CM | POA: Diagnosis not present

## 2024-08-15 DIAGNOSIS — Z0001 Encounter for general adult medical examination with abnormal findings: Secondary | ICD-10-CM | POA: Insufficient documentation

## 2024-08-15 NOTE — Progress Notes (Signed)
 "       Subjective:  Patient ID: [name], male    DOB: [birth year]  Age: 74 y.o. MRN: [id number]  CC: Annual Exam   HPI [name] presents for a CPX and f/up ---   Discussed the use of AI scribe software for clinical note transcription with the patient, who gave verbal consent to proceed.  History of Present Illness [name] is a 74 year old male with end-stage renal disease on dialysis who presents with left breast pain and itchy ear.  He has been experiencing intermittent left breast pain for the past six to seven months without any palpable mass or swelling. He denies any discharge or bleeding.  He reports an itchy ear, particularly on one side, but has not applied any treatment to it yet.  He is on dialysis three times a week (Monday, Wednesday, and Friday) and notes occasional issues with fluid removal, leading to symptoms such as feeling hot and sweaty. He does not urinate due to being on dialysis.  He experiences occasional night sweats, about two to three times a week. No persistent cough, wheezing, shortness of breath, fevers, chills, or abdominal pain.  He has a history of kidney transplant and attributes his return to dialysis to complications following a COVID vaccine, which he took despite being on immunosuppressants.  He reports blurred vision and has not had an eye exam in approximately two years.  He has a history of peripheral artery disease in the left leg, for which he underwent surgery. He experiences heaviness around the toes of the left foot but denies pain in the right leg.     Outpatient Medications Prior to Visit  Medication Sig Dispense Refill   amLODipine  (NORVASC ) 10 MG tablet Take 10 mg by mouth at bedtime.     aspirin  EC 81 MG tablet Take 1 tablet (81 mg total) by mouth daily. Swallow whole. 30 tablet 12   atorvastatin  (LIPITOR) 10 MG tablet Take 1 tablet (10 mg total) by mouth daily. 90 tablet 2   carvedilol  (COREG ) 25  MG tablet Take 25 mg by mouth 2 (two) times daily with a meal.     cinacalcet  (SENSIPAR ) 30 MG tablet Take 2 tablets (60 mg total) by mouth every Monday, Wednesday, and Friday at 6 PM. 60 tablet 0   clopidogrel  (PLAVIX ) 75 MG tablet Take 1 tablet (75 mg total) by mouth daily with breakfast. 90 tablet 0   gabapentin  (NEURONTIN ) 100 MG capsule Take 100 mg by mouth daily as needed (nerve pain).     oxyCODONE  (OXY IR/ROXICODONE ) 5 MG immediate release tablet Take 1 tablet (5 mg total) by mouth 2 (two) times daily as needed for severe pain (pain score 7-10). 60 tablet 0   polyethylene glycol (MIRALAX  / GLYCOLAX ) 17 g packet Take 17 g by mouth daily. (Patient taking differently: Take 17 g by mouth daily as needed for moderate constipation.) 14 each 0   sevelamer  carbonate (RENVELA ) 800 MG tablet Take 4 tablets by mouth 3 (three) times daily with meals.     sodium polystyrene (KAYEXALATE) powder Take 30 g by mouth 2 (two) times a week. Thursdays Sundays     No facility-administered medications prior to visit.    ROS Review of Systems  Constitutional:  Negative for appetite change, chills, diaphoresis, fatigue and fever.  HENT: Negative.  Negative for sore throat and trouble swallowing.   Eyes: Negative.   Respiratory:  Positive for cough. Negative for chest tightness,  shortness of breath, wheezing and stridor.   Cardiovascular:  Positive for chest pain. Negative for palpitations and leg swelling.  Gastrointestinal: Negative.  Negative for abdominal pain, constipation, diarrhea, nausea and vomiting.  Endocrine: Negative.   Genitourinary: Negative.  Negative for difficulty urinating and hematuria.  Musculoskeletal: Negative.  Negative for myalgias.  Skin: Negative.  Negative for color change and rash.  Neurological:  Positive for weakness. Negative for dizziness and light-headedness.  Hematological:  Negative for adenopathy. Does not bruise/bleed easily.  Psychiatric/Behavioral:  Positive for  confusion and decreased concentration.     Objective:  BP (!) 142/78 (BP Location: Left Arm, Patient Position: Sitting, Cuff Size: Normal)   Pulse 85   Temp 97.9 F (36.6 C) (Oral)   Resp 16   Ht 5' 10 (1.778 m)   Wt 163 lb (73.9 kg)   SpO2 97%   BMI 23.39 kg/m   BP Readings from Last 3 Encounters:  08/15/24 (!) 142/78  07/04/24 137/75  06/14/24 118/74    Wt Readings from Last 3 Encounters:  08/15/24 163 lb (73.9 kg)  07/04/24 147 lb (66.7 kg)  06/14/24 147 lb (66.7 kg)    Physical Exam Vitals reviewed.  Constitutional:      General: He is not in acute distress.    Appearance: He is ill-appearing. He is not toxic-appearing or diaphoretic.  HENT:     Nose: Nose normal.     Mouth/Throat:     Mouth: Mucous membranes are moist.  Eyes:     General: No scleral icterus.    Conjunctiva/sclera: Conjunctivae normal.  Cardiovascular:     Rate and Rhythm: Normal rate and regular rhythm.     Heart sounds: No murmur heard.    No friction rub. No gallop.  Pulmonary:     Effort: Pulmonary effort is normal. No respiratory distress.     Breath sounds: No stridor. No wheezing, rhonchi or rales.  Chest:     Chest wall: No mass, deformity, swelling, tenderness or edema.  Breasts:    Right: Swelling present. No bleeding, inverted nipple, mass, nipple discharge, skin change or tenderness.     Left: Swelling present. No bleeding, inverted nipple, mass, nipple discharge, skin change or tenderness.     Comments: +++ bilateral gynecomastia Abdominal:     General: Abdomen is flat. Bowel sounds are normal.     Palpations: There is no mass.     Tenderness: There is no abdominal tenderness. There is no guarding.     Hernia: No hernia is present.  Musculoskeletal:        General: Normal range of motion.     Cervical back: Neck supple.     Right lower leg: No edema.     Left lower leg: No edema.  Lymphadenopathy:     Cervical: No cervical adenopathy.     Upper Body:     Right upper  body: No supraclavicular, axillary or pectoral adenopathy.     Left upper body: No supraclavicular, axillary or pectoral adenopathy.  Skin:    General: Skin is warm and dry.  Neurological:     General: No focal deficit present.     Mental Status: He is alert.  Psychiatric:        Mood and Affect: Mood normal.        Behavior: Behavior normal.     Lab Results  Component Value Date   WBC 5.7 05/24/2024   HGB 10.9 (L) 05/24/2024   HCT 33.7 (L) 05/24/2024  PLT 192 05/24/2024   GLUCOSE 86 05/24/2024   CHOL 201 (H) 05/24/2024   TRIG 103 05/24/2024   HDL 55 05/24/2024   LDLDIRECT 75.0 05/18/2017   LDLCALC 125 (H) 05/24/2024   ALT 10 05/23/2024   AST 16 05/23/2024   NA 133 (L) 05/24/2024   K 4.8 05/24/2024   CL 93 (L) 05/24/2024   CREATININE 7.89 (H) 05/24/2024   BUN 41 (H) 05/24/2024   CO2 22 05/24/2024   TSH 1.57 03/22/2024   PSA 1.07 03/12/2021   INR 1.1 05/22/2024   HGBA1C 4.7 (L) 05/23/2024    VAS US  ABI WITH/WO TBI Result Date: 07/04/2024  LOWER EXTREMITY DOPPLER STUDY Patient Name:  BABE CLENNEY  Date of Exam:   07/04/2024 Medical Rec #: [id number]          Accession #:    [id number] Date of Birth: 1951-01-19          Patient Gender: M Patient Age:   21 years Exam Location:  [address] Procedure:      VAS US  ABI WITH/WO TBI Referring Phys: GAILE NEW --------------------------------------------------------------------------------  Indications: Ulceration. High Risk Factors: Hypertension, hyperlipidemia, Diabetes.  Limitations: Today's exam was limited due to involuntary patient movement and an              open wound. Performing Technologist: Geni Lodge RVS, RCS  Examination Guidelines: A complete evaluation includes at minimum, Doppler waveform signals and systolic blood pressure reading at the level of bilateral brachial, anterior tibial, and posterior tibial arteries, when vessel segments are accessible. Bilateral testing is considered an integral part of a  complete examination. Photoelectric Plethysmograph (PPG) waveforms and toe systolic pressure readings are included as required and additional duplex testing as needed. Limited examinations for reoccurring indications may be performed as noted.  ABI Findings: +---------+------------------+-----+----------+----------------+ Right    Rt Pressure (mmHg)IndexWaveform  Comment          +---------+------------------+-----+----------+----------------+ Brachial                                  AVF              +---------+------------------+-----+----------+----------------+ PTA                             monophasicnon-compressible +---------+------------------+-----+----------+----------------+ DP                              monophasicnon-compressible +---------+------------------+-----+----------+----------------+ Great Toe42                0.31                            +---------+------------------+-----+----------+----------------+ +---------+------------------+-----+----------+----------------+ Left     Lt Pressure (mmHg)IndexWaveform  Comment          +---------+------------------+-----+----------+----------------+ Brachial 134                                               +---------+------------------+-----+----------+----------------+ PTA                             biphasic  non-compressible +---------+------------------+-----+----------+----------------+ DP  178               1.33 monophasicnon-compressible +---------+------------------+-----+----------+----------------+ Great Toe56                0.42                            +---------+------------------+-----+----------+----------------+ +-------+-----------+-----------+------------+------------+ ABI/TBIToday's ABIToday's TBIPrevious ABIPrevious TBI +-------+-----------+-----------+------------+------------+ Right  [place]         0.31       [place]          0.35          +-------+-----------+-----------+------------+------------+ Left   [place]         0.42       [place]          absent       +-------+-----------+-----------+------------+------------+  Summary: Right: Resting right ankle-brachial index indicates noncompressible right lower extremity arteries. The right toe-brachial index is abnormal.  Left: Resting left ankle-brachial index indicates noncompressible left lower extremity arteries. The left toe-brachial index is abnormal.  *See table(s) above for measurements and observations.  Electronically signed by Lonni Gaskins MD on 07/04/2024 at 5:04:52 PM.    Final    VAS US  LOWER EXTREMITY ARTERIAL DUPLEX Result Date: 07/04/2024 LOWER EXTREMITY ARTERIAL DUPLEX STUDY Patient Name:  SHADY BRADISH  Date of Exam:   07/04/2024 Medical Rec #: [id number]          Accession #:    [id number] Date of Birth: 09-17-1950          Patient Gender: M Patient Age:   5 years Exam Location:  [address] Procedure:      VAS US  LOWER EXTREMITY ARTERIAL DUPLEX Referring Phys: GAILE NEW --------------------------------------------------------------------------------  Indications: Ulceration, and peripheral artery disease. High Risk Factors: Hypertension, hyperlipidemia, Diabetes.  Vascular Interventions: 05/23/2024                         Left SFA stent and angioplasty of the posterior tibial                         artery. Current ABI:            R=[place], L=[place] Performing Technologist: Geni Lodge RVS, RCS  Examination Guidelines: A complete evaluation includes B-mode imaging, spectral Doppler, color Doppler, and power Doppler as needed of all accessible portions of each vessel. Bilateral testing is considered an integral part of a complete examination. Limited examinations for reoccurring indications may be performed as noted.   +----------+--------+-----+--------+---------+--------+ LEFT      PSV cm/sRatioStenosisWaveform Comments  +----------+--------+-----+--------+---------+--------+ CFA Distal157                  biphasic          +----------+--------+-----+--------+---------+--------+ DFA       172                  triphasic         +----------+--------+-----+--------+---------+--------+ SFA Prox  161                  biphasic          +----------+--------+-----+--------+---------+--------+ POP Mid   70                   biphasic          +----------+--------+-----+--------+---------+--------+ PTA Prox  138  biphasic          +----------+--------+-----+--------+---------+--------+ PTA Mid   157                  biphasic          +----------+--------+-----+--------+---------+--------+ PTA Distal104                  biphasic          +----------+--------+-----+--------+---------+--------+  Left Stent(s): +---------------+--------+--------+---------+--------+ SFA            PSV cm/sStenosisWaveform Comments +---------------+--------+--------+---------+--------+ Prox to Stent  51              biphasic          +---------------+--------+--------+---------+--------+ Proximal Stent 50              biphasic          +---------------+--------+--------+---------+--------+ Mid Stent      111             triphasic         +---------------+--------+--------+---------+--------+ Distal Stent   74              biphasic          +---------------+--------+--------+---------+--------+ Distal to Stent109             triphasic         +---------------+--------+--------+---------+--------+    Summary: Left: Patent superficial femoral artery stent without significant stenosis. Patent posterior tibial artery without significant stenosis. Calcified vessels observed throughout.  See table(s) above for measurements and observations. Electronically signed by Lonni Gaskins MD on 07/04/2024 at 5:04:40 PM.    Final     DG Chest 2 View Result Date:  08/15/2024 CLINICAL DATA:  Cough.  Night sweats. EXAM: CHEST - 2 VIEW COMPARISON:  11/20/2023 FINDINGS: The heart size and mediastinal contours are within normal limits. Mild right basilar scarring noted. The lungs are otherwise clear. The visualized skeletal structures are unremarkable. IMPRESSION: No active cardiopulmonary disease. Electronically Signed   By: Norleen DELENA Kil M.D.   On: 08/15/2024 10:52     Assessment & Plan:   Type 2 diabetes mellitus without complication, without long-term current use of insulin  (HCC)- Blood sugar is well controlled. -     HM Diabetes Foot Exam  Encounter for general adult medical examination with abnormal findings- Exam completed, labs reviewed, vaccines reviewed (he refused),  no cancer screenings indicated, pt ed material was given.   Subacute cough- CXR is normal. -     DG Chest 2 View; Future  Night sweats -     DG Chest 2 View; Future  Pleurodynia -     DG Chest 2 View; Future  Essential hypertension- BP is adequately well controlled.  Ischemic ulcer of left ankle, unspecified ulcer stage (HCC)- This is improving.     Follow-up: Return in about 3 months (around 11/13/2024).  [name] Molt, MD "

## 2024-08-15 NOTE — Patient Instructions (Signed)

## 2024-09-01 ENCOUNTER — Ambulatory Visit: Admitting: Podiatry

## 2024-09-01 ENCOUNTER — Ambulatory Visit (INDEPENDENT_AMBULATORY_CARE_PROVIDER_SITE_OTHER)

## 2024-09-01 ENCOUNTER — Encounter: Payer: Self-pay | Admitting: Podiatry

## 2024-09-01 DIAGNOSIS — L97329 Non-pressure chronic ulcer of left ankle with unspecified severity: Secondary | ICD-10-CM

## 2024-09-01 DIAGNOSIS — I999 Unspecified disorder of circulatory system: Secondary | ICD-10-CM

## 2024-09-01 MED ORDER — HYDROCODONE-ACETAMINOPHEN 10-325 MG PO TABS: 1.0000 | ORAL_TABLET | Freq: Three times a day (TID) | ORAL | 0 refills | Status: AC | PRN

## 2024-09-01 NOTE — Progress Notes (Signed)
 Subjective:   Patient ID: [name], male   DOB: 74 y.o.   MRN: [id number]   HPI Patient presents with caregiver stating the area on the back of my Achilles which has been present for around a month seems somewhat better but I just have generalized pain in my feet.  Patient states the pain in his feet are very bad at nighttime and he did have revascularization of his superficial femoral in November   ROS      Objective:  Physical Exam  Vascular status indicates no palpable pulses but he does have calcification of his arteries and nothing else that they can do.  He is noted to have a crusted area on the back of the left Achilles within the tendon itself measuring around 1.5 cm x 1.5 cm which is smaller than what it was previously.  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  I do think 1 pain pill at night may be worth it and patient will take 1 at bedtime and to be seen back by his family physician.  I would leave the other crusted area alone as it appears stable and I would like not to have to do anything with it  X-rays indicate there does not appear to be any osteolysis or bone infection in the area of this crusted chronic tissue formation posterior left heel of approximate 4-week duration

## 2024-10-03 ENCOUNTER — Ambulatory Visit

## 2024-10-03 ENCOUNTER — Ambulatory Visit (HOSPITAL_COMMUNITY)

## 2025-03-31 ENCOUNTER — Ambulatory Visit
